# Patient Record
Sex: Male | Born: 1940 | Race: White | Hispanic: No | Smoking: Former smoker
Health system: Southern US, Community
[De-identification: ages and names within clinical notes are randomized; demographics above are authoritative.]

## PROBLEM LIST (undated history)

## (undated) DIAGNOSIS — I639 Cerebral infarction, unspecified: Secondary | ICD-10-CM

## (undated) DIAGNOSIS — I4891 Unspecified atrial fibrillation: Secondary | ICD-10-CM

## (undated) DIAGNOSIS — R6510 Systemic inflammatory response syndrome (SIRS) of non-infectious origin without acute organ dysfunction: Secondary | ICD-10-CM

## (undated) DIAGNOSIS — G600 Hereditary motor and sensory neuropathy: Secondary | ICD-10-CM

## (undated) DIAGNOSIS — C8510 Unspecified B-cell lymphoma, unspecified site: Secondary | ICD-10-CM

## (undated) DIAGNOSIS — R531 Weakness: Secondary | ICD-10-CM

## (undated) DIAGNOSIS — I10 Essential (primary) hypertension: Secondary | ICD-10-CM

## (undated) DIAGNOSIS — C801 Malignant (primary) neoplasm, unspecified: Secondary | ICD-10-CM

## (undated) HISTORY — PX: ANKLE FUSION: SHX5718

## (undated) HISTORY — PX: URETHRA SURGERY: SHX824

## (undated) HISTORY — DX: Cerebral infarction, unspecified: I63.9

## (undated) HISTORY — PX: CIRCUMCISION: SUR203

## (undated) HISTORY — DX: Unspecified B-cell lymphoma, unspecified site: C85.10

## (undated) HISTORY — DX: Unspecified atrial fibrillation: I48.91

## (undated) HISTORY — DX: Hereditary motor and sensory neuropathy: G60.0

## (undated) HISTORY — PX: CATARACT EXTRACTION: SUR2

## (undated) HISTORY — PX: TOE AMPUTATION: SHX809

---

## 1997-12-10 ENCOUNTER — Encounter: Admission: RE | Admit: 1997-12-10 | Discharge: 1998-03-10 | Payer: Self-pay | Admitting: Internal Medicine

## 1999-08-24 ENCOUNTER — Encounter: Payer: Self-pay | Admitting: Cardiology

## 1999-08-24 ENCOUNTER — Inpatient Hospital Stay (HOSPITAL_COMMUNITY): Admission: EM | Admit: 1999-08-24 | Discharge: 1999-08-25 | Payer: Self-pay | Admitting: Emergency Medicine

## 1999-08-25 ENCOUNTER — Encounter: Payer: Self-pay | Admitting: Cardiology

## 1999-11-28 ENCOUNTER — Ambulatory Visit (HOSPITAL_BASED_OUTPATIENT_CLINIC_OR_DEPARTMENT_OTHER): Admission: RE | Admit: 1999-11-28 | Discharge: 1999-11-28 | Payer: Self-pay | Admitting: Surgery

## 2000-05-17 ENCOUNTER — Encounter: Admission: RE | Admit: 2000-05-17 | Discharge: 2000-08-15 | Payer: Self-pay | Admitting: Endocrinology

## 2000-07-12 ENCOUNTER — Ambulatory Visit (HOSPITAL_COMMUNITY): Admission: RE | Admit: 2000-07-12 | Discharge: 2000-07-12 | Payer: Self-pay | Admitting: Cardiology

## 2001-06-17 ENCOUNTER — Ambulatory Visit (HOSPITAL_COMMUNITY): Admission: RE | Admit: 2001-06-17 | Discharge: 2001-06-17 | Payer: Self-pay | Admitting: Orthopedic Surgery

## 2001-10-05 ENCOUNTER — Encounter (INDEPENDENT_AMBULATORY_CARE_PROVIDER_SITE_OTHER): Payer: Self-pay | Admitting: Specialist

## 2001-10-05 ENCOUNTER — Encounter: Payer: Self-pay | Admitting: Orthopedic Surgery

## 2001-10-05 ENCOUNTER — Inpatient Hospital Stay (HOSPITAL_COMMUNITY): Admission: RE | Admit: 2001-10-05 | Discharge: 2001-10-11 | Payer: Self-pay | Admitting: Orthopedic Surgery

## 2002-05-06 ENCOUNTER — Emergency Department (HOSPITAL_COMMUNITY): Admission: EM | Admit: 2002-05-06 | Discharge: 2002-05-06 | Payer: Self-pay | Admitting: Emergency Medicine

## 2002-05-06 ENCOUNTER — Encounter: Payer: Self-pay | Admitting: Emergency Medicine

## 2008-12-02 ENCOUNTER — Inpatient Hospital Stay (HOSPITAL_COMMUNITY): Admission: EM | Admit: 2008-12-02 | Discharge: 2008-12-07 | Payer: Self-pay | Admitting: Emergency Medicine

## 2008-12-02 ENCOUNTER — Encounter: Payer: Self-pay | Admitting: Infectious Diseases

## 2008-12-02 ENCOUNTER — Ambulatory Visit: Payer: Self-pay | Admitting: Infectious Diseases

## 2008-12-03 ENCOUNTER — Encounter: Payer: Self-pay | Admitting: Infectious Diseases

## 2008-12-04 ENCOUNTER — Ambulatory Visit: Payer: Self-pay | Admitting: Physical Medicine & Rehabilitation

## 2008-12-04 ENCOUNTER — Encounter: Payer: Self-pay | Admitting: Infectious Diseases

## 2008-12-07 ENCOUNTER — Ambulatory Visit: Payer: Self-pay | Admitting: Physical Medicine & Rehabilitation

## 2008-12-07 ENCOUNTER — Inpatient Hospital Stay (HOSPITAL_COMMUNITY)
Admission: RE | Admit: 2008-12-07 | Discharge: 2008-12-19 | Payer: Self-pay | Admitting: Physical Medicine & Rehabilitation

## 2008-12-19 ENCOUNTER — Ambulatory Visit (HOSPITAL_COMMUNITY): Admission: AD | Admit: 2008-12-19 | Discharge: 2008-12-19 | Payer: Self-pay | Admitting: Interventional Radiology

## 2009-01-09 ENCOUNTER — Ambulatory Visit (HOSPITAL_COMMUNITY)
Admission: RE | Admit: 2009-01-09 | Discharge: 2009-01-09 | Payer: Self-pay | Admitting: Physical Medicine & Rehabilitation

## 2009-01-09 ENCOUNTER — Ambulatory Visit
Admission: RE | Admit: 2009-01-09 | Discharge: 2009-01-09 | Payer: Self-pay | Admitting: Physical Medicine & Rehabilitation

## 2009-01-09 ENCOUNTER — Encounter: Payer: Self-pay | Admitting: Interventional Radiology

## 2009-01-29 ENCOUNTER — Encounter
Admission: RE | Admit: 2009-01-29 | Discharge: 2009-02-05 | Payer: Self-pay | Admitting: Physical Medicine & Rehabilitation

## 2009-02-05 ENCOUNTER — Ambulatory Visit: Payer: Self-pay | Admitting: Physical Medicine & Rehabilitation

## 2009-04-03 ENCOUNTER — Ambulatory Visit: Payer: Self-pay | Admitting: Vascular Surgery

## 2009-04-03 ENCOUNTER — Encounter (INDEPENDENT_AMBULATORY_CARE_PROVIDER_SITE_OTHER): Payer: Self-pay | Admitting: Interventional Radiology

## 2009-04-03 ENCOUNTER — Ambulatory Visit (HOSPITAL_COMMUNITY): Admission: RE | Admit: 2009-04-03 | Discharge: 2009-04-03 | Payer: Self-pay | Admitting: Interventional Radiology

## 2009-10-24 ENCOUNTER — Encounter: Admission: RE | Admit: 2009-10-24 | Discharge: 2009-10-24 | Payer: Self-pay | Admitting: Family Medicine

## 2009-10-30 ENCOUNTER — Encounter: Admission: RE | Admit: 2009-10-30 | Discharge: 2009-10-30 | Payer: Self-pay | Admitting: Family Medicine

## 2009-11-06 ENCOUNTER — Other Ambulatory Visit: Admission: RE | Admit: 2009-11-06 | Discharge: 2009-11-06 | Payer: Self-pay | Admitting: Otolaryngology

## 2009-11-22 ENCOUNTER — Ambulatory Visit (HOSPITAL_COMMUNITY): Admission: RE | Admit: 2009-11-22 | Discharge: 2009-11-23 | Payer: Self-pay | Admitting: Otolaryngology

## 2009-11-22 ENCOUNTER — Encounter (INDEPENDENT_AMBULATORY_CARE_PROVIDER_SITE_OTHER): Payer: Self-pay | Admitting: Otolaryngology

## 2009-11-27 ENCOUNTER — Ambulatory Visit: Payer: Self-pay | Admitting: Oncology

## 2009-12-04 LAB — CBC WITH DIFFERENTIAL/PLATELET
BASO%: 0.1 % (ref 0.0–2.0)
Basophils Absolute: 0 10*3/uL (ref 0.0–0.1)
HCT: 48 % (ref 38.4–49.9)
HGB: 16.3 g/dL (ref 13.0–17.1)
MCHC: 34.1 g/dL (ref 32.0–36.0)
MONO#: 1 10*3/uL — ABNORMAL HIGH (ref 0.1–0.9)
NEUT%: 75.3 % — ABNORMAL HIGH (ref 39.0–75.0)
RDW: 12.9 % (ref 11.0–14.6)
WBC: 13.3 10*3/uL — ABNORMAL HIGH (ref 4.0–10.3)
lymph#: 1.8 10*3/uL (ref 0.9–3.3)

## 2009-12-05 LAB — LACTATE DEHYDROGENASE: LDH: 140 U/L (ref 94–250)

## 2009-12-05 LAB — COMPREHENSIVE METABOLIC PANEL
ALT: 15 U/L (ref 0–53)
Albumin: 3.5 g/dL (ref 3.5–5.2)
CO2: 25 mEq/L (ref 19–32)
Calcium: 8.6 mg/dL (ref 8.4–10.5)
Chloride: 103 mEq/L (ref 96–112)
Creatinine, Ser: 0.73 mg/dL (ref 0.40–1.50)
Potassium: 4.3 mEq/L (ref 3.5–5.3)

## 2009-12-05 LAB — HEPATITIS B SURFACE ANTIGEN: Hepatitis B Surface Ag: NEGATIVE

## 2009-12-10 ENCOUNTER — Ambulatory Visit (HOSPITAL_COMMUNITY): Admission: RE | Admit: 2009-12-10 | Discharge: 2009-12-10 | Payer: Self-pay | Admitting: Oncology

## 2009-12-12 ENCOUNTER — Ambulatory Visit (HOSPITAL_COMMUNITY): Admission: RE | Admit: 2009-12-12 | Discharge: 2009-12-12 | Payer: Self-pay | Admitting: Oncology

## 2009-12-13 ENCOUNTER — Ambulatory Visit (HOSPITAL_COMMUNITY): Admission: RE | Admit: 2009-12-13 | Discharge: 2009-12-13 | Payer: Self-pay | Admitting: Oncology

## 2009-12-19 ENCOUNTER — Encounter: Payer: Self-pay | Admitting: Oncology

## 2009-12-19 ENCOUNTER — Ambulatory Visit: Admission: RE | Admit: 2009-12-19 | Discharge: 2009-12-19 | Payer: Self-pay | Admitting: Oncology

## 2009-12-27 ENCOUNTER — Ambulatory Visit: Payer: Self-pay | Admitting: Oncology

## 2009-12-27 LAB — COMPREHENSIVE METABOLIC PANEL
ALT: 16 U/L (ref 0–53)
AST: 14 U/L (ref 0–37)
Alkaline Phosphatase: 74 U/L (ref 39–117)
Glucose, Bld: 214 mg/dL — ABNORMAL HIGH (ref 70–99)
Sodium: 137 mEq/L (ref 135–145)
Total Bilirubin: 0.4 mg/dL (ref 0.3–1.2)
Total Protein: 6.5 g/dL (ref 6.0–8.3)

## 2009-12-27 LAB — CBC WITH DIFFERENTIAL/PLATELET
Basophils Absolute: 0 10*3/uL (ref 0.0–0.1)
EOS%: 4.6 % (ref 0.0–7.0)
HCT: 46.6 % (ref 38.4–49.9)
HGB: 15.6 g/dL (ref 13.0–17.1)
MCH: 29.8 pg (ref 27.2–33.4)
NEUT%: 68.6 % (ref 39.0–75.0)
lymph#: 2 10*3/uL (ref 0.9–3.3)

## 2009-12-27 LAB — URIC ACID: Uric Acid, Serum: 4.7 mg/dL (ref 4.0–7.8)

## 2010-01-17 LAB — CBC WITH DIFFERENTIAL/PLATELET
Basophils Absolute: 0 10*3/uL (ref 0.0–0.1)
EOS%: 0.8 % (ref 0.0–7.0)
HCT: 43.4 % (ref 38.4–49.9)
HGB: 14.8 g/dL (ref 13.0–17.1)
LYMPH%: 11.5 % — ABNORMAL LOW (ref 14.0–49.0)
MCH: 30.1 pg (ref 27.2–33.4)
MCV: 88.2 fL (ref 79.3–98.0)
MONO%: 7.1 % (ref 0.0–14.0)
NEUT%: 80.3 % — ABNORMAL HIGH (ref 39.0–75.0)
RDW: 13 % (ref 11.0–14.6)

## 2010-01-17 LAB — COMPREHENSIVE METABOLIC PANEL
AST: 16 U/L (ref 0–37)
Alkaline Phosphatase: 77 U/L (ref 39–117)
BUN: 15 mg/dL (ref 6–23)
Creatinine, Ser: 0.67 mg/dL (ref 0.40–1.50)
Total Bilirubin: 0.3 mg/dL (ref 0.3–1.2)

## 2010-01-17 LAB — MAGNESIUM: Magnesium: 1.8 mg/dL (ref 1.5–2.5)

## 2010-02-05 ENCOUNTER — Ambulatory Visit: Payer: Self-pay | Admitting: Oncology

## 2010-02-07 LAB — LACTATE DEHYDROGENASE: LDH: 172 U/L (ref 94–250)

## 2010-02-07 LAB — CBC WITH DIFFERENTIAL/PLATELET
Basophils Absolute: 0.1 10*3/uL (ref 0.0–0.1)
HCT: 41.1 % (ref 38.4–49.9)
HGB: 14.4 g/dL (ref 13.0–17.1)
LYMPH%: 12.8 % — ABNORMAL LOW (ref 14.0–49.0)
MCH: 30 pg (ref 27.2–33.4)
MONO#: 1.2 10*3/uL — ABNORMAL HIGH (ref 0.1–0.9)
NEUT%: 75.5 % — ABNORMAL HIGH (ref 39.0–75.0)
Platelets: 159 10*3/uL (ref 140–400)
WBC: 12.1 10*3/uL — ABNORMAL HIGH (ref 4.0–10.3)
lymph#: 1.6 10*3/uL (ref 0.9–3.3)

## 2010-02-07 LAB — MAGNESIUM: Magnesium: 1.8 mg/dL (ref 1.5–2.5)

## 2010-02-07 LAB — COMPREHENSIVE METABOLIC PANEL
BUN: 20 mg/dL (ref 6–23)
CO2: 27 mEq/L (ref 19–32)
Calcium: 8.9 mg/dL (ref 8.4–10.5)
Chloride: 100 mEq/L (ref 96–112)
Creatinine, Ser: 0.75 mg/dL (ref 0.40–1.50)
Glucose, Bld: 296 mg/dL — ABNORMAL HIGH (ref 70–99)

## 2010-02-07 LAB — URIC ACID: Uric Acid, Serum: 5.1 mg/dL (ref 4.0–7.8)

## 2010-02-26 ENCOUNTER — Ambulatory Visit (HOSPITAL_COMMUNITY): Admission: RE | Admit: 2010-02-26 | Discharge: 2010-02-26 | Payer: Self-pay | Admitting: Oncology

## 2010-02-27 LAB — CBC WITH DIFFERENTIAL/PLATELET
BASO%: 0.6 % (ref 0.0–2.0)
HCT: 39.2 % (ref 38.4–49.9)
LYMPH%: 5.6 % — ABNORMAL LOW (ref 14.0–49.0)
MCH: 30.3 pg (ref 27.2–33.4)
MCHC: 35.2 g/dL (ref 32.0–36.0)
MCV: 86.2 fL (ref 79.3–98.0)
MONO#: 1.2 10*3/uL — ABNORMAL HIGH (ref 0.1–0.9)
MONO%: 8.4 % (ref 0.0–14.0)
NEUT%: 84.9 % — ABNORMAL HIGH (ref 39.0–75.0)
Platelets: 180 10*3/uL (ref 140–400)
RBC: 4.55 10*6/uL (ref 4.20–5.82)
WBC: 14.2 10*3/uL — ABNORMAL HIGH (ref 4.0–10.3)

## 2010-02-27 LAB — COMPREHENSIVE METABOLIC PANEL
AST: 13 U/L (ref 0–37)
BUN: 12 mg/dL (ref 6–23)
Calcium: 9 mg/dL (ref 8.4–10.5)
Chloride: 102 mEq/L (ref 96–112)
Creatinine, Ser: 0.7 mg/dL (ref 0.40–1.50)
Total Bilirubin: 0.5 mg/dL (ref 0.3–1.2)

## 2010-03-04 ENCOUNTER — Ambulatory Visit: Admission: RE | Admit: 2010-03-04 | Discharge: 2010-04-15 | Payer: Self-pay | Admitting: Radiation Oncology

## 2010-03-25 ENCOUNTER — Ambulatory Visit: Payer: Self-pay | Admitting: Oncology

## 2010-03-27 LAB — COMPREHENSIVE METABOLIC PANEL
ALT: 13 U/L (ref 0–53)
AST: 13 U/L (ref 0–37)
Albumin: 3.5 g/dL (ref 3.5–5.2)
Alkaline Phosphatase: 52 U/L (ref 39–117)
Potassium: 4.1 mEq/L (ref 3.5–5.3)
Sodium: 141 mEq/L (ref 135–145)
Total Bilirubin: 0.5 mg/dL (ref 0.3–1.2)
Total Protein: 5.6 g/dL — ABNORMAL LOW (ref 6.0–8.3)

## 2010-03-27 LAB — CBC WITH DIFFERENTIAL/PLATELET
BASO%: 0 % (ref 0.0–2.0)
EOS%: 4 % (ref 0.0–7.0)
Eosinophils Absolute: 0.5 10*3/uL (ref 0.0–0.5)
LYMPH%: 8 % — ABNORMAL LOW (ref 14.0–49.0)
MCHC: 34.9 g/dL (ref 32.0–36.0)
MCV: 91.7 fL (ref 79.3–98.0)
MONO%: 8.6 % (ref 0.0–14.0)
NEUT#: 9.1 10*3/uL — ABNORMAL HIGH (ref 1.5–6.5)
Platelets: 127 10*3/uL — ABNORMAL LOW (ref 140–400)
RBC: 4.28 10*6/uL (ref 4.20–5.82)
RDW: 15.3 % — ABNORMAL HIGH (ref 11.0–14.6)
WBC: 11.4 10*3/uL — ABNORMAL HIGH (ref 4.0–10.3)

## 2010-05-13 ENCOUNTER — Ambulatory Visit: Payer: Self-pay | Admitting: Oncology

## 2010-06-04 ENCOUNTER — Encounter
Admission: RE | Admit: 2010-06-04 | Discharge: 2010-06-04 | Payer: Self-pay | Source: Home / Self Care | Attending: Family Medicine | Admitting: Family Medicine

## 2010-06-20 ENCOUNTER — Ambulatory Visit: Payer: Self-pay | Admitting: Oncology

## 2010-06-26 LAB — CBC WITH DIFFERENTIAL/PLATELET
BASO%: 0.3 % (ref 0.0–2.0)
Basophils Absolute: 0 10*3/uL (ref 0.0–0.1)
EOS%: 1.9 % (ref 0.0–7.0)
Eosinophils Absolute: 0.3 10*3/uL (ref 0.0–0.5)
HCT: 44.5 % (ref 38.4–49.9)
HGB: 15.8 g/dL (ref 13.0–17.1)
LYMPH%: 6.2 % — ABNORMAL LOW (ref 14.0–49.0)
MCH: 30.3 pg (ref 27.2–33.4)
MCHC: 35.5 g/dL (ref 32.0–36.0)
MCV: 85.2 fL (ref 79.3–98.0)
MONO#: 1.6 10*3/uL — ABNORMAL HIGH (ref 0.1–0.9)
MONO%: 10.5 % (ref 0.0–14.0)
NEUT#: 12.7 10*3/uL — ABNORMAL HIGH (ref 1.5–6.5)
NEUT%: 81.1 % — ABNORMAL HIGH (ref 39.0–75.0)
Platelets: 138 10*3/uL — ABNORMAL LOW (ref 140–400)
RBC: 5.22 10*6/uL (ref 4.20–5.82)
RDW: 12.7 % (ref 11.0–14.6)
WBC: 15.6 10*3/uL — ABNORMAL HIGH (ref 4.0–10.3)
lymph#: 1 10*3/uL (ref 0.9–3.3)

## 2010-06-26 LAB — LACTATE DEHYDROGENASE: LDH: 141 U/L (ref 94–250)

## 2010-06-26 LAB — COMPREHENSIVE METABOLIC PANEL
ALT: 22 U/L (ref 0–53)
AST: 15 U/L (ref 0–37)
Albumin: 3.8 g/dL (ref 3.5–5.2)
Alkaline Phosphatase: 63 U/L (ref 39–117)
BUN: 13 mg/dL (ref 6–23)
CO2: 30 mEq/L (ref 19–32)
Calcium: 9.2 mg/dL (ref 8.4–10.5)
Chloride: 101 mEq/L (ref 96–112)
Creatinine, Ser: 0.66 mg/dL (ref 0.40–1.50)
Glucose, Bld: 146 mg/dL — ABNORMAL HIGH (ref 70–99)
Potassium: 4.2 mEq/L (ref 3.5–5.3)
Sodium: 141 mEq/L (ref 135–145)
Total Bilirubin: 0.4 mg/dL (ref 0.3–1.2)
Total Protein: 6.3 g/dL (ref 6.0–8.3)

## 2010-07-11 ENCOUNTER — Other Ambulatory Visit: Payer: Self-pay | Admitting: Oncology

## 2010-07-11 DIAGNOSIS — C8590 Non-Hodgkin lymphoma, unspecified, unspecified site: Secondary | ICD-10-CM

## 2010-07-14 ENCOUNTER — Encounter: Payer: Self-pay | Admitting: Physical Medicine & Rehabilitation

## 2010-08-08 ENCOUNTER — Encounter (HOSPITAL_BASED_OUTPATIENT_CLINIC_OR_DEPARTMENT_OTHER): Payer: MEDICARE | Admitting: Oncology

## 2010-08-08 DIAGNOSIS — Z452 Encounter for adjustment and management of vascular access device: Secondary | ICD-10-CM

## 2010-08-08 DIAGNOSIS — C8291 Follicular lymphoma, unspecified, lymph nodes of head, face, and neck: Secondary | ICD-10-CM

## 2010-08-25 ENCOUNTER — Other Ambulatory Visit: Payer: Self-pay | Admitting: Oncology

## 2010-08-25 ENCOUNTER — Encounter (HOSPITAL_COMMUNITY): Payer: Self-pay

## 2010-08-25 ENCOUNTER — Encounter (HOSPITAL_BASED_OUTPATIENT_CLINIC_OR_DEPARTMENT_OTHER): Payer: MEDICARE | Admitting: Oncology

## 2010-08-25 ENCOUNTER — Ambulatory Visit (HOSPITAL_COMMUNITY)
Admission: RE | Admit: 2010-08-25 | Discharge: 2010-08-25 | Disposition: A | Discharge status: Home / Self Care | Payer: MEDICARE | Source: Ambulatory Visit | Attending: Oncology | Admitting: Oncology

## 2010-08-25 DIAGNOSIS — Z9221 Personal history of antineoplastic chemotherapy: Secondary | ICD-10-CM | POA: Insufficient documentation

## 2010-08-25 DIAGNOSIS — D739 Disease of spleen, unspecified: Secondary | ICD-10-CM | POA: Insufficient documentation

## 2010-08-25 DIAGNOSIS — Z452 Encounter for adjustment and management of vascular access device: Secondary | ICD-10-CM

## 2010-08-25 DIAGNOSIS — I2510 Atherosclerotic heart disease of native coronary artery without angina pectoris: Secondary | ICD-10-CM | POA: Insufficient documentation

## 2010-08-25 DIAGNOSIS — K7689 Other specified diseases of liver: Secondary | ICD-10-CM | POA: Insufficient documentation

## 2010-08-25 DIAGNOSIS — N400 Enlarged prostate without lower urinary tract symptoms: Secondary | ICD-10-CM | POA: Insufficient documentation

## 2010-08-25 DIAGNOSIS — C8589 Other specified types of non-Hodgkin lymphoma, extranodal and solid organ sites: Secondary | ICD-10-CM | POA: Insufficient documentation

## 2010-08-25 DIAGNOSIS — K4090 Unilateral inguinal hernia, without obstruction or gangrene, not specified as recurrent: Secondary | ICD-10-CM | POA: Insufficient documentation

## 2010-08-25 DIAGNOSIS — C8291 Follicular lymphoma, unspecified, lymph nodes of head, face, and neck: Secondary | ICD-10-CM

## 2010-08-25 DIAGNOSIS — C8590 Non-Hodgkin lymphoma, unspecified, unspecified site: Secondary | ICD-10-CM

## 2010-08-25 DIAGNOSIS — Z923 Personal history of irradiation: Secondary | ICD-10-CM | POA: Insufficient documentation

## 2010-08-25 DIAGNOSIS — Q619 Cystic kidney disease, unspecified: Secondary | ICD-10-CM | POA: Insufficient documentation

## 2010-08-25 HISTORY — DX: Malignant (primary) neoplasm, unspecified: C80.1

## 2010-08-25 HISTORY — DX: Essential (primary) hypertension: I10

## 2010-08-25 LAB — CBC WITH DIFFERENTIAL/PLATELET
Basophils Absolute: 0 10*3/uL (ref 0.0–0.1)
Eosinophils Absolute: 0.3 10*3/uL (ref 0.0–0.5)
HCT: 46.6 % (ref 38.4–49.9)
HGB: 15.7 g/dL (ref 13.0–17.1)
LYMPH%: 12.4 % — ABNORMAL LOW (ref 14.0–49.0)
MCV: 89.3 fL (ref 79.3–98.0)
MONO#: 1 10*3/uL — ABNORMAL HIGH (ref 0.1–0.9)
MONO%: 9.7 % (ref 0.0–14.0)
NEUT#: 7.5 10*3/uL — ABNORMAL HIGH (ref 1.5–6.5)
NEUT%: 74.5 % (ref 39.0–75.0)
Platelets: 127 10*3/uL — ABNORMAL LOW (ref 140–400)
RBC: 5.22 10*6/uL (ref 4.20–5.82)
WBC: 10.1 10*3/uL (ref 4.0–10.3)

## 2010-08-25 LAB — LACTATE DEHYDROGENASE: LDH: 155 U/L (ref 94–250)

## 2010-08-25 LAB — CMP (CANCER CENTER ONLY)
Albumin: 3.4 g/dL (ref 3.3–5.5)
CO2: 32 mEq/L (ref 18–33)
Glucose, Bld: 109 mg/dL (ref 73–118)
Sodium: 135 mEq/L (ref 128–145)
Total Bilirubin: 0.5 mg/dl (ref 0.20–1.60)
Total Protein: 6.7 g/dL (ref 6.4–8.1)

## 2010-08-25 MED ORDER — IOHEXOL 300 MG/ML  SOLN
100.0000 mL | Freq: Once | INTRAMUSCULAR | Status: AC | PRN
  Administered 2010-08-25: 100 mL via INTRAVENOUS

## 2010-09-01 ENCOUNTER — Encounter (HOSPITAL_BASED_OUTPATIENT_CLINIC_OR_DEPARTMENT_OTHER): Payer: MEDICARE | Admitting: Oncology

## 2010-09-01 DIAGNOSIS — D72829 Elevated white blood cell count, unspecified: Secondary | ICD-10-CM

## 2010-09-01 DIAGNOSIS — C8291 Follicular lymphoma, unspecified, lymph nodes of head, face, and neck: Secondary | ICD-10-CM

## 2010-09-07 LAB — GLUCOSE, CAPILLARY: Glucose-Capillary: 151 mg/dL — ABNORMAL HIGH (ref 70–99)

## 2010-09-08 LAB — CBC
HCT: 49.4 % (ref 39.0–52.0)
MCH: 30.8 pg (ref 26.0–34.0)
MCHC: 34.2 g/dL (ref 30.0–36.0)
MCV: 90 fL (ref 78.0–100.0)
Platelets: 150 10*3/uL (ref 150–400)
Platelets: 153 10*3/uL (ref 150–400)
RBC: 5.04 MIL/uL (ref 4.22–5.81)
RDW: 12.9 % (ref 11.5–15.5)

## 2010-09-08 LAB — GLUCOSE, CAPILLARY
Glucose-Capillary: 313 mg/dL — ABNORMAL HIGH (ref 70–99)
Glucose-Capillary: 313 mg/dL — ABNORMAL HIGH (ref 70–99)

## 2010-09-08 LAB — PROTIME-INR: Prothrombin Time: 14.1 seconds (ref 11.6–15.2)

## 2010-09-08 LAB — BASIC METABOLIC PANEL
BUN: 9 mg/dL (ref 6–23)
CO2: 28 mEq/L (ref 19–32)
Chloride: 99 mEq/L (ref 96–112)
Glucose, Bld: 227 mg/dL — ABNORMAL HIGH (ref 70–99)
Potassium: 4.2 mEq/L (ref 3.5–5.1)

## 2010-09-29 LAB — TROPONIN I: Troponin I: 0.01 ng/mL (ref 0.00–0.06)

## 2010-09-29 LAB — BASIC METABOLIC PANEL
BUN: 10 mg/dL (ref 6–23)
BUN: 12 mg/dL (ref 6–23)
BUN: 7 mg/dL (ref 6–23)
CO2: 26 mEq/L (ref 19–32)
Calcium: 8.8 mg/dL (ref 8.4–10.5)
Calcium: 9 mg/dL (ref 8.4–10.5)
Chloride: 108 mEq/L (ref 96–112)
Chloride: 110 mEq/L (ref 96–112)
Creatinine, Ser: 0.5 mg/dL (ref 0.4–1.5)
Creatinine, Ser: 0.57 mg/dL (ref 0.4–1.5)
GFR calc Af Amer: >60 mL/min (ref >60)
GFR calc Af Amer: >60 mL/min (ref >60)
GFR calc Af Amer: >60 mL/min (ref >60)
GFR calc non Af Amer: >60 mL/min (ref >60)
GFR calc non Af Amer: >60 mL/min (ref >60)
GFR calc non Af Amer: >60 mL/min (ref >60)
GFR calc non Af Amer: >60 mL/min (ref >60)
Glucose, Bld: 163 mg/dL — ABNORMAL HIGH (ref 70–99)
Glucose, Bld: 98 mg/dL (ref 70–99)
Potassium: 3.8 mEq/L (ref 3.5–5.1)
Potassium: 3.6 mEq/L (ref 3.5–5.1)
Potassium: 4.1 mEq/L (ref 3.5–5.1)
Sodium: 141 mEq/L (ref 135–145)
Sodium: 141 mEq/L (ref 135–145)
Sodium: 142 mEq/L (ref 135–145)

## 2010-09-29 LAB — CBC
HCT: 41.6 % (ref 39.0–52.0)
HCT: 41.9 % (ref 39.0–52.0)
HCT: 43.8 % (ref 39.0–52.0)
HCT: 41.6 % (ref 39.0–52.0)
HCT: 43.9 % (ref 39.0–52.0)
HCT: 48.1 % (ref 39.0–52.0)
Hemoglobin: 14 g/dL (ref 13.0–17.0)
Hemoglobin: 14 g/dL (ref 13.0–17.0)
Hemoglobin: 16.7 g/dL (ref 13.0–17.0)
MCHC: 33.6 g/dL (ref 30.0–36.0)
MCHC: 34.1 g/dL (ref 30.0–36.0)
MCHC: 34.4 g/dL (ref 30.0–36.0)
MCHC: 34 g/dL (ref 30.0–36.0)
MCV: 90.5 fL (ref 78.0–100.0)
MCV: 91.1 fL (ref 78.0–100.0)
MCV: 89.2 fL (ref 78.0–100.0)
MCV: 90 fL (ref 78.0–100.0)
MCV: 90.7 fL (ref 78.0–100.0)
Platelets: 139 10*3/uL — ABNORMAL LOW (ref 150–400)
Platelets: 148 10*3/uL — ABNORMAL LOW (ref 150–400)
Platelets: 182 10*3/uL (ref 150–400)
Platelets: 145 10*3/uL — ABNORMAL LOW (ref 150–400)
Platelets: 159 10*3/uL (ref 150–400)
RBC: 4.39 MIL/uL (ref 4.22–5.81)
RBC: 4.68 MIL/uL (ref 4.22–5.81)
RBC: 4.62 MIL/uL (ref 4.22–5.81)
RDW: 12.7 % (ref 11.5–15.5)
RDW: 12.8 % (ref 11.5–15.5)
RDW: 12.9 % (ref 11.5–15.5)
RDW: 13.2 % (ref 11.5–15.5)
WBC: 10.1 10*3/uL (ref 4.0–10.5)
WBC: 12.1 10*3/uL — ABNORMAL HIGH (ref 4.0–10.5)
WBC: 12.3 10*3/uL — ABNORMAL HIGH (ref 4.0–10.5)
WBC: 13.1 10*3/uL — ABNORMAL HIGH (ref 4.0–10.5)
WBC: 13.8 10*3/uL — ABNORMAL HIGH (ref 4.0–10.5)

## 2010-09-29 LAB — GLUCOSE, CAPILLARY
Glucose-Capillary: 108 mg/dL — ABNORMAL HIGH (ref 70–99)
Glucose-Capillary: 121 mg/dL — ABNORMAL HIGH (ref 70–99)
Glucose-Capillary: 138 mg/dL — ABNORMAL HIGH (ref 70–99)
Glucose-Capillary: 139 mg/dL — ABNORMAL HIGH (ref 70–99)
Glucose-Capillary: 144 mg/dL — ABNORMAL HIGH (ref 70–99)
Glucose-Capillary: 163 mg/dL — ABNORMAL HIGH (ref 70–99)
Glucose-Capillary: 175 mg/dL — ABNORMAL HIGH (ref 70–99)
Glucose-Capillary: 181 mg/dL — ABNORMAL HIGH (ref 70–99)
Glucose-Capillary: 191 mg/dL — ABNORMAL HIGH (ref 70–99)
Glucose-Capillary: 211 mg/dL — ABNORMAL HIGH (ref 70–99)
Glucose-Capillary: 214 mg/dL — ABNORMAL HIGH (ref 70–99)
Glucose-Capillary: 214 mg/dL — ABNORMAL HIGH (ref 70–99)
Glucose-Capillary: 218 mg/dL — ABNORMAL HIGH (ref 70–99)
Glucose-Capillary: 235 mg/dL — ABNORMAL HIGH (ref 70–99)
Glucose-Capillary: 237 mg/dL — ABNORMAL HIGH (ref 70–99)
Glucose-Capillary: 239 mg/dL — ABNORMAL HIGH (ref 70–99)
Glucose-Capillary: 245 mg/dL — ABNORMAL HIGH (ref 70–99)
Glucose-Capillary: 246 mg/dL — ABNORMAL HIGH (ref 70–99)
Glucose-Capillary: 250 mg/dL — ABNORMAL HIGH (ref 70–99)
Glucose-Capillary: 259 mg/dL — ABNORMAL HIGH (ref 70–99)
Glucose-Capillary: 261 mg/dL — ABNORMAL HIGH (ref 70–99)
Glucose-Capillary: 264 mg/dL — ABNORMAL HIGH (ref 70–99)
Glucose-Capillary: 268 mg/dL — ABNORMAL HIGH (ref 70–99)
Glucose-Capillary: 269 mg/dL — ABNORMAL HIGH (ref 70–99)
Glucose-Capillary: 272 mg/dL — ABNORMAL HIGH (ref 70–99)
Glucose-Capillary: 272 mg/dL — ABNORMAL HIGH (ref 70–99)
Glucose-Capillary: 279 mg/dL — ABNORMAL HIGH (ref 70–99)
Glucose-Capillary: 295 mg/dL — ABNORMAL HIGH (ref 70–99)
Glucose-Capillary: 329 mg/dL — ABNORMAL HIGH (ref 70–99)
Glucose-Capillary: 332 mg/dL — ABNORMAL HIGH (ref 70–99)
Glucose-Capillary: 351 mg/dL — ABNORMAL HIGH (ref 70–99)
Glucose-Capillary: 358 mg/dL — ABNORMAL HIGH (ref 70–99)
Glucose-Capillary: 398 mg/dL — ABNORMAL HIGH (ref 70–99)
Glucose-Capillary: 59 mg/dL — ABNORMAL LOW (ref 70–99)
Glucose-Capillary: 62 mg/dL — ABNORMAL LOW (ref 70–99)
Glucose-Capillary: 66 mg/dL — ABNORMAL LOW (ref 70–99)
Glucose-Capillary: 71 mg/dL (ref 70–99)
Glucose-Capillary: 76 mg/dL (ref 70–99)
Glucose-Capillary: 106 mg/dL — ABNORMAL HIGH (ref 70–99)
Glucose-Capillary: 113 mg/dL — ABNORMAL HIGH (ref 70–99)
Glucose-Capillary: 210 mg/dL — ABNORMAL HIGH (ref 70–99)
Glucose-Capillary: 66 mg/dL — ABNORMAL LOW (ref 70–99)
Glucose-Capillary: 71 mg/dL (ref 70–99)
Glucose-Capillary: 74 mg/dL (ref 70–99)
Glucose-Capillary: 75 mg/dL (ref 70–99)
Glucose-Capillary: 80 mg/dL (ref 70–99)
Glucose-Capillary: 80 mg/dL (ref 70–99)
Glucose-Capillary: 91 mg/dL (ref 70–99)

## 2010-09-29 LAB — URINE CULTURE

## 2010-09-29 LAB — CK TOTAL AND CKMB (NOT AT ARMC)
CK, MB: 0.5 ng/mL (ref 0.3–4.0)
Relative Index: INVALID (ref 0.0–2.5)
Total CK: 7 U/L (ref 7–232)

## 2010-09-29 LAB — DIFFERENTIAL
Basophils Absolute: 0 10*3/uL (ref 0.0–0.1)
Basophils Absolute: 0.1 10*3/uL (ref 0.0–0.1)
Eosinophils Absolute: 0.2 10*3/uL (ref 0.0–0.7)
Eosinophils Absolute: 0.2 10*3/uL (ref 0.0–0.7)
Eosinophils Relative: 4 % (ref 0–5)
Eosinophils Relative: 1 % (ref 0–5)
Eosinophils Relative: 2 % (ref 0–5)
Lymphocytes Relative: 17 % (ref 12–46)
Lymphocytes Relative: 18 % (ref 12–46)
Lymphocytes Relative: 12 % (ref 12–46)
Lymphocytes Relative: 24 % (ref 12–46)
Lymphs Abs: 2.2 10*3/uL (ref 0.7–4.0)
Lymphs Abs: 1.7 10*3/uL (ref 0.7–4.0)
Lymphs Abs: 3 10*3/uL (ref 0.7–4.0)
Monocytes Absolute: 0.7 10*3/uL (ref 0.1–1.0)
Monocytes Absolute: 1.2 10*3/uL — ABNORMAL HIGH (ref 0.1–1.0)
Monocytes Absolute: 0.9 10*3/uL (ref 0.1–1.0)
Monocytes Absolute: 1.5 10*3/uL — ABNORMAL HIGH (ref 0.1–1.0)
Monocytes Relative: 10 % (ref 3–12)
Monocytes Relative: 8 % (ref 3–12)
Neutro Abs: 6.1 10*3/uL (ref 1.7–7.7)
Neutro Abs: 8.1 10*3/uL — ABNORMAL HIGH (ref 1.7–7.7)

## 2010-09-29 LAB — COMPREHENSIVE METABOLIC PANEL
Albumin: 2.4 g/dL — ABNORMAL LOW (ref 3.5–5.2)
Albumin: 2.6 g/dL — ABNORMAL LOW (ref 3.5–5.2)
BUN: 10 mg/dL (ref 6–23)
BUN: 9 mg/dL (ref 6–23)
CO2: 28 mEq/L (ref 19–32)
Calcium: 8.4 mg/dL (ref 8.4–10.5)
Chloride: 108 mEq/L (ref 96–112)
Creatinine, Ser: 0.56 mg/dL (ref 0.4–1.5)
Creatinine, Ser: 0.54 mg/dL (ref 0.4–1.5)
GFR calc non Af Amer: >60 mL/min (ref >60)
Total Bilirubin: 0.8 mg/dL (ref 0.3–1.2)
Total Protein: 5.6 g/dL — ABNORMAL LOW (ref 6.0–8.3)

## 2010-09-29 LAB — APTT
aPTT: 37 seconds (ref 24–37)
aPTT: 32 seconds (ref 24–37)

## 2010-09-29 LAB — HEMOGLOBIN A1C
Hgb A1c MFr Bld: 9 % — ABNORMAL HIGH (ref 4.6–6.1)
Mean Plasma Glucose: 212 mg/dL

## 2010-09-29 LAB — LIPID PANEL
HDL: 19 mg/dL — ABNORMAL LOW (ref >39)
Total CHOL/HDL Ratio: 6.9 RATIO
Triglycerides: 64 mg/dL (ref <150)

## 2010-09-29 LAB — URINALYSIS, ROUTINE W REFLEX MICROSCOPIC
Ketones, ur: NEGATIVE mg/dL
Nitrite: NEGATIVE
Protein, ur: NEGATIVE mg/dL
Specific Gravity, Urine: 1.032 — ABNORMAL HIGH (ref 1.005–1.030)
Urobilinogen, UA: 0.2 mg/dL (ref 0.0–1.0)

## 2010-09-29 LAB — PROTIME-INR
INR: 1.1 (ref 0.00–1.49)
Prothrombin Time: 14.1 s (ref 11.6–15.2)

## 2010-09-29 LAB — URINALYSIS, MICROSCOPIC ONLY
Bilirubin Urine: NEGATIVE
Hgb urine dipstick: NEGATIVE
Ketones, ur: NEGATIVE mg/dL
Nitrite: POSITIVE — AB
pH: 7 (ref 5.0–8.0)

## 2010-09-29 LAB — URINE MICROSCOPIC-ADD ON

## 2010-09-29 LAB — HEPARIN LEVEL (UNFRACTIONATED): Heparin Unfractionated: 0.42 IU/mL (ref 0.30–0.70)

## 2010-10-03 ENCOUNTER — Encounter (HOSPITAL_BASED_OUTPATIENT_CLINIC_OR_DEPARTMENT_OTHER): Payer: MEDICARE | Admitting: Oncology

## 2010-10-03 DIAGNOSIS — C8291 Follicular lymphoma, unspecified, lymph nodes of head, face, and neck: Secondary | ICD-10-CM

## 2010-10-03 DIAGNOSIS — Z452 Encounter for adjustment and management of vascular access device: Secondary | ICD-10-CM

## 2010-11-04 NOTE — H&P (Signed)
NAMELYNX, GOODRICH               ACCOUNT NO.:  [id number]   MEDICAL RECORD NO.:  [id number]          PATIENT TYPE:  AMB   LOCATION:                               FACILITY:  [facility]   PHYSICIAN:  Sanjeev K. Deveshwar, M.D.DATE OF BIRTH:  23-Dec-1940   DATE OF ADMISSION:  12/19/2008  DATE OF DISCHARGE:                              HISTORY & PHYSICAL   CHIEF COMPLAINT:  Cerebrovascular disease.   HISTORY OF PRESENT ILLNESS:  Mr. Riker is a pleasant 70 year old  male, who was admitted to [facility] on December 02, 2008, with a  left brain stem stroke.  On presentation, the patient had decreased  hearing as well as right-sided weakness that began approximately 1 month  prior to admission.  He also had a severe left-sided headache and some  difficulties with his balance.  A CT scan of the head on December 02, 2008,  showed no acute abnormality.  The patient had an MRI/MRA performed on  June 14, this showed multifocal pontine and medullary infarcts.  There  were punctate left cerebellar infarcts superimposed on chronic left  cerebellar lacunar infarcts.  There was no associated hemorrhage noted.  The MRA noted poor flow in the vertebral arteries.  A cerebral angiogram  was recommended and this was performed on December 04, 2008, by Dr.  Corliss Skains.  The cerebral angiogram showed severe stenosis of both the  right and left vertebral arteries as well as a 70-75% stenosis at the  origin of the left vertebral artery.  There was a 50% stenosis of the  left internal carotid artery at the petrous cavernous junction.   The patient was eventually admitted to [facility] on December 07, 2008.  He did very well during his  rehab stay.  Dr. Pearlean Brownie recommended stent-assisted angioplasty of the  left vertebral artery once the patient was ready for discharge from the  rehab unit.  The patient is to be discharged tomorrow on June 30, at  that which time he will be admitted to the  acute care side of the [facility] with plans for endovascular treatment of his  cerebrovascular disease to be performed by Dr. Corliss Skains.   PAST MEDICAL HISTORY:  Significant for hypertension, hyperlipidemia, and  diabetes mellitus with diabetic neuropathy.  The patient has severe  peripheral vascular disease.  He is status post amputation of the of the  third right toe, which was performed in 2003.  He does have a history of  paroxysmal atrial fibrillation.  He had some dysphagia as a result of  his stroke.  He was treated by the speech therapist during his rehab  stay and this improved considerably.  The patient had a lower extremity  angiogram performed in 2002 with an attempted PTA of the left  superficial femoral artery, however, this was unsuccessful.  At that  time, the patient was noted to have total occlusion of the left  superficial femoral artery, total occlusion of the left anterior tibial  artery, a 70% stenosis of the right superficial femoral artery, and 95%  stenosis  of the right anterior tibial artery.  The patient also has a  history of a finger injury on the left hand.  He had a recent ingrown  toe nail of the great toe of the left foot that was treated by his  podiatrist.  He is completing a course of Keflex at this time.   ALLERGIES:  The patient is allergic to PENICILLIN, which causes a rash.  He states that ACTOS caused him to go into atrial fibrillation in the  past.   CURRENT MEDICATIONS:  1. Aspirin 325 mg daily.  2. FiberCon at bedtime.  3. Keflex 250 mg q.i.d., which will be completed on 29th.  4. Plavix 75 mg daily.  5. He is currently on Lovenox 40 mg daily.  6. Glyburide 5 mg daily.  7. He is on NovoLog insulin as well as Lantus insulin.  The Lantus is      15 mg daily with 35 mg at bedtime.  8. He is on lisinopril 5 mg b.i.d.  9. Protonix 40 mg daily.  10.Zocor 20 mg at bedtime.  11.Albuterol inhaler p.r.n.   SOCIAL HISTORY:  The  patient lives with his wife in [facility].  He has  1 grown daughter.  He is a retired Chartered certified accountant.  He quit smoking 17 years  ago, although he did smoke up to 3 packs of cigarettes per day for 35  years.  He does not use alcohol.   FAMILY HISTORY:  His mother died in her 33s from congestive heart  failure.  His father died at age 35 from complications of surgery.   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  He has had an open reduction  and internal fixation of the left ankle.  He denies any previous  problems with anesthesia.   REVIEW OF SYSTEMS:  Completely negative except for the following.  He  has an occasional cough.  He feels it is secondary to his dysphagia.  He  has gastroesophageal reflux disease with a hiatal hernia.  He sometimes  has difficulty keeping his food down.  He has occasional hemorrhoidal  bleeding.  As noted, he recently had a left great ingrown toenail.  He  has occasional problems with his balance as a result of his stroke.   PHYSICAL EXAMINATION:  GENERAL:  A pleasant 70 year old male, seated in  a wheelchair.  VITAL SIGNS:  Blood pressure 114/56, pulse 78, respirations 18,  temperature 97.2, and oxygen saturation 95% on room air.  HEENT:  Mild dysarthria with a facial droop.  NECK:  No bruits.  HEART:  Regular rate and rhythm with distant heart sounds.  LUNGS:  Decreased but clear.  ABDOMEN:  Soft and nontender.  EXTREMITIES:  Pulses to be weak to absent.  He has 3+ pitting edema  bilaterally with cyanosis.  He is absent a toe from his right foot.  He  has a healing ingrown toenail on the great toe of the left foot.  His  airway is rated at 1.  His ASA scale is rated at a 4.  NEUROLOGIC:  The patient is alert and oriented and follows commands.  Cranial nerves II through XII reveal a facial droop and mild dysarthria.  Sensation is intact bilaterally except for both lower extremities  where  he has decreased sensation as a result of his diabetic neuropathy.  Motor strength on the right is 4/5, 5/5 on the left.  Cerebellar testing  revealed difficulty with finger-to-nose testing on the right.   LABORATORY DATA:  His BUN is 6, creatinine is 0.5, potassium is 3.9, and  GFR is greater than 60.  His glucose was 54.  Other labs are currently  pending.  A chest x-ray on June 24 showed lingular scarring, but no  active disease.   IMPRESSION:  1. Left cerebellar cerebrovascular accident.  2. Cerebrovascular disease as documented by cerebral angiogram      performed on December 04, 2008, with severe left vertebral artery      stenosis.  3. Remote history of tobacco use, question chronic obstructive      pulmonary disease  4. Severe peripheral vascular disease.  5. Dysphagia is a result of his stroke.  6. Hypertension.  7. Hyperlipidemia.  8. Diabetes mellitus with diabetic neuropathy.  9. History of paroxysmal atrial fibrillation.  10.Ejection fraction of 55-60% by echo on December 04, 2008.  11.History of a left finger injury.  12.History of allergy to PENICILLIN.  13.Status post multiple surgeries as well as treatment for a recent      left ingrown toenail.   PLAN:  The patient will be admitted to [facility] on December 19, 2008, from the [facility] to undergo  stent-assisted angioplasty of the left vertebral artery to be performed  under monitored anesthesia care by Dr. Corliss Skains.      [name], P.A.    ______________________________  Grandville Silos. Corliss Skains, M.D.    DR/MEDQ  D:  12/18/2008  T:  12/19/2008  Job:  [id number]   cc:   Windle Guard, M.D.  Pramod P. Pearlean Brownie, MD  Ranelle Oyster, M.D.  Mick Sell, MD

## 2010-11-04 NOTE — Consult Note (Signed)
NAMEPLEZ, BELTON NO.:  [id number]   MEDICAL RECORD NO.:  [id number]          PATIENT TYPE:  INP   LOCATION:  3032                         FACILITY:  [facility]   PHYSICIAN:  Noel Christmas, MD    DATE OF BIRTH:  29-Dec-1940   DATE OF CONSULTATION:  12/02/2008  DATE OF DISCHARGE:                                 CONSULTATION   REFERRING PHYSICIAN:  Stann Mainland. Sampson Goon, MD   REASON FOR CONSULTATION:  Acute stroke.   HISTORY OF PRESENT ILLNESS:  This is a 70 year old man with complaint of  vertigo intermittently for about 1 month along with increasing speech  difficulty with slurring of speech for about 6 days and progressive  weakness involving his right side for 2 days affecting his ability to  walk.  He has continued to have episodes of vertigo when he stands.  There is no associated nausea.  He has not had diplopia.  The patient  has a long history of hypertension and diabetes mellitus.  CT scan of  his head was unremarkable for an acute intracranial abnormality.  He has  been on aspirin 81 mg per day.  There is no previous history of stroke  or TIA.   PAST MEDICAL HISTORY:  Remarkable for:  1. Diabetes mellitus.  2. Hypertension.  3. Peripheral neuropathy, most likely secondary to diabetes mellitus.   CURRENT MEDICATIONS:  1. Glyburide 2.5 mg per day.  2. Lantus 36 units q.a.m.  3. Aspirin 81 mg per day.   PHYSICAL EXAMINATION:  GENERAL:  Appearance was that of slightly elderly  man who was alert and cooperative, in no acute distress.  He was  oriented well to time as well as place.  Memory was normal.  Affect was  appropriate.  There is no receptive or expressive aphasia.  HEENT:  Pupils were equal and reactive normally to light.  Extraocular  movements and visual fields were normal.  He had moderate right lower  facial weakness.  He was moderately hard of hearing.  Speech was  moderately dysarthric.  MOTOR:  Mild-to-moderate proximal and distal  weakness of his right upper  extremity as well as proximal weakness of his right lower extremity.  Strength of his left extremity was normal.  Muscle tone was normal  throughout.  Deep tendon reflexes are 1+ in the upper extremities and  absent at the knees and ankles.  Plantar responses were mute.  Sensory  exam showed stocking loss of perception of all modalities distal to the  mid calf bilaterally.  Carotid and subclavian auscultation was normal.   CLINICAL IMPRESSION:  1. Stepwise progression of clinical deficits implying stroke and      evolution, most likely thromboembolic phenomena involving the      vertebrobasilar vascular territory.  Cardiac source for potential      emboli cannot be ruled out as well.  2. Moderately severe peripheral neuropathy, most likely secondary to      chronic diabetes mellitus.   RECOMMENDATIONS:  1. CTA of the head and neck to rule out possible thrombus.  2. Echocardiogram in a.m.  3. Would recommend anticoagulation with heparin drip without initial      heparin IV bolus at least until echocardiogram is completed and no      indication of thrombus or embolus source is seen on echocardiogram      as well as with CTA of head and neck.  4. Physical therapy, occupational therapy, and speech therapy      consults.   Thank you for asking me to evaluate Mr. Hoge.      Noel Christmas, MD  Electronically Signed     CS/MEDQ  D:  12/02/2008  T:  12/03/2008  Job:  [id number]

## 2010-11-04 NOTE — Discharge Summary (Signed)
[name], [name]               ACCOUNT NO.:  [id number]   MEDICAL RECORD NO.:  [id number]          PATIENT TYPE:  IPS   LOCATION:  4033                         FACILITY:  [facility]   PHYSICIAN:  Ranelle Oyster, M.D.DATE OF BIRTH:  1940-11-11   DATE OF ADMISSION:  12/07/2008  DATE OF DISCHARGE:  12/19/2008                               DISCHARGE SUMMARY   DISCHARGE DIAGNOSES:  1. Left pontomedullary cerebrovascular accident.  2. Ingrown left great toenail, improving.  3. Diabetes mellitus type 2.  4. Hypertension.  5. Hearing loss with tinnitus and vertigo.   HISTORY OF PRESENT ILLNESS:  [name] is a 70 year old male with  history of diabetes mellitus, peripheral vascular disease with right  third toe ray amputation, admitted on June 13 with 36-month history of  left-sided headache and treated for sinusitis.  Admitted on June 13 with  decreased hearing, weakness on right side and vertigo.  CCT showed no  acute changes.  Neuro evaluated the patient and felt the patient had  left hemisphere thromboembolic infarct.  Cardiac echo showed EF of 55-  60%, calcification of mitral valve with mild regurg.  Carotid Dopplers  done showed severe right ECA stenosis and left 40-50% stenosis  questioned due to tortuosity.  MRA of brain showed multiple pontine and  medullary infarcts on the left and punctate infarct, left cerebellum,  vertebral flow in slow, left greater than right.  Cerebral angio showed  50% stenosis of left ICA 70-75% stenosis at origin of left vertebral  artery and preocclusive stenosis left vertebrobasilar junction and  preocclusive tendon stenosis, right vertebrobasilar junction.  Neuro  recommends aspirin, Plavix for CVA prophylaxis and stent of left  vertebral artery in the CIR stay.  The patient continues with right  hemiparesis, right dysmetria, and severe dysphagia, and BS done showed  severe dysphagia and n.p.o. was recommended.  Currently, Panda tube  feeds  ongoing.  Speech Therapy is doing trials of puree.  The patient is  noted to have poor standing balance for OT task.  Requires cues to  utilize right upper extremity for self-care, noted to have right lower  extremity instability.  The patient was evaluated by a rehab and felt  that he would benefit from CIR program.   PAST MEDICAL HISTORY:  Significant for PAF, right third toe ray  amputation in 2003 for osteomyelitis, left fifth finger injury with  contracture, a tinnitus and vertigo for the past month, peripheral  vascular disease left greater than right lower extremity and left ankle  ORIF fracture.   ALLERGIES:  PENICILLIN and ACTOS.   REVIEW OF SYMPTOMS:  Positive for decreased hearing right ear as well as  tinnitus and weakness and some wheezing.   FAMILY HISTORY:  Positive for coronary artery disease.   SOCIAL HISTORY:  The patient is married, lives in 1-level home with 3  steps at entry and a ramp at entry additionally.  He is a retired  Chartered certified accountant.  He quit tobacco 17 years ago.  Does not use any alcohol.  Wife is retired and can provide supervision past discharge.   FUNCTIONAL  HISTORY:  The patient was independent in driving prior to  admission.   FUNCTIONAL STATUS:  The patient is min assist for upper body care, mod-  to-max assist for lower body care, mod assist for transfers, mod assist  for 5 pivoting steps with support of right hip and right lower extremity  both stance and weight shifting.   PHYSICAL EXAMINATION:  VITAL SIGNS:  Blood pressure 138/65, pulse of 62,  respiratory rate 20, temperature 98.1.  GENERAL:  The patient is pleasant, alert, oriented male, in no acute  distress.  HEENT:  Pupils equal, round, and reactive to light.  Hearing is poor on  right.  Ear, nose, throat exam notable for some slight thrush and  borderline dentition.  NECK:  Supple without JVD or lymphadenopathy.  LUNGS:  Notable for some inspiratory wheezes, right greater than left.   HEART:  Regular rate and rhythm without murmurs or gallops.  EXTREMITIES:  No evidence of clubbing.  No cyanosis.  Mild erythema  noted in right great toe due to ingrown toenail.  Well-healed old  incision, left ankle and right foot, stasis changes in bilateral lower  extremities.  NEUROLOGIC:  Cranial nerves II through XII shows right central VII with  decreased hearing on right.  No frank visual deficits.  Reflexes 1+.  Sensation decreased slightly on right arm as well as distal lower  extremities at the ankle to feet.  Strength is 4/5 right hand and elbow,  3 to 3+/5 right shoulder, right lower extremity is 4/5.  The patient's  left upper and lower extremity are 4+ to 5/5.  The patient has ataxia,  right arm and right leg, with decreased fine motor control as well.  He  has positive pronator drift on right side as well.  Judgment  orientation, memory, mood are functional.  Mild-to-moderate dysarthria  noted.   HOSPITAL COURSE:  [name] was admitted to rehab on December 07, 2008, for inpatient therapy to consist of PT/OT, and speech therapy at  least 3 hours 5 days a week.  Past admission, Panda tube was clipped in  place with tube feeds ongoing.  CBG is checked q.6 h. with sliding scale  insulin for elevated blood sugars.  The patient was maintained on  aspirin, Plavix for CVA prophylaxis throughout the stay.  The patient  was noted to have some wheezing and rhonchi in part due to the [place] in  place.  The breathing treatments were started to help the  symptomatology.  Speech Therapy initially worked with the patient on  p.o. trials.  As swallowing was noted be improving, swallow study was  done on June 22 showing significantly improved deglutition.  However,  the patient continued to be moderate aspiration risk on thin and nectar.  The patient was able to utilize chin tuck to protect his airway during  swallow with nectar thickened liquids.  He was started on D3 diet nectar   liquids.  The speech has continued to follow on the patient throughout  the stay.  Repeat swallow done on June 29 shows much improvement in his  swallow with mild oropharyngeal dysphagia with both silent and audible  penetration and trace amounts of aspiration of thin and nectar liquids.  The patient is able to utilize neck flexion and chin tuck to provide  minimal airway protection.  Speech feels there is notable improvement in  motor sensory function.  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  His diet  has been advanced to regular diet thin liquids, and the patient to  continue with chin tuck avoid straws and follow solids with liquids to  prevent aspiration.  Other issue during this stay has been problems with  frequency.  Initially, the patient was noted to have some voiding  difficulty requiring in-and-out caths.  However, with improved mobility,  the patient was noted to be voiding with PVRs at 50-75 mL.  The patient  with history of frequency and was started on low-dose Ditropan 2.5 mg  p.o. at bedtime.  However, with addition of Ditropan, the patient was  noted to have some issues with retention with bladder volumes up to 300  mL.  Therefore, Ditropan was discontinued.  The patient's blood  pressures have been monitored on b.i.d. basis during this stay.  These  are variable ranging from 120s to 130s systolics.  Overall occasionally,  systolic blood pressures noted to be in 150s range.  Heart rate stable  in 60-70s range.   The patient was started on warm water soaks for right ingrown toenail.  Additionally, Keflex was added for 5 days to help with ingrown toenail  symptomatology.  This toe did improve briefly.  However, once off  antibiotics, the patient was noted to have recurrence of erythema and  tenderness at great toe.  The patient was started on Keflex again on  June 25 and Dr. Ralene Cork, Podiatry, was consulted  for input.  The patient  was also started on vinegar soaks b.i.d. basis.  The patient's  paronychia was I and D'ed and debrided on June 26 by Dr. Ralene Cork.  Triple  antibiotic ointment and dressing changes were applied with  recommendations for daily clip changes with daily cleansing with vinegar  soaks and applying Betadine and Band-Aid past soaking.  The patient to  follow up with Dr. Ralene Cork in 2-3 weeks.  He recommends maintaining  Keflex q.i.d. for now.  Recheck lytes of June 29 revealed sodium 141,  potassium 3.9, chloride 105, CO2 28, BUN 6, creatinine 0.50, glucose 54.  The patient's blood sugars initially were noted to be very reliable  ranging in 200 range.  When tube feeds were discontinued, the patient  was noted to have some hypoglycemic episodes of blood sugars in 70s to  240s range.  The patient's glyburide was increased to 5 mg with p.m.  Lantus dose being decreased to prevent hypoglycemic episodes.  The  patient to continue checking blood sugars on q.i.d. basis past discharge  and follow up with his primary MD for further adjustment in his diabetes  medication.  The patient's mood has been stable.  He has been motivated  and participating in therapy.   During the patient's stay in rehab, physiatrist, rehab, RN, and therapy  team have worked together to provide customized collaborative  interdisciplinary care.  Rehab RN has been working with the patient on  wound care as well as monitoring p.o. intake.  They have also been  working on bowel and bladder program with scheduled toileting due to the  patient's frequency.  Weekly team conferences were held to monitor the  patient's progress, set goals, as well as discuss barriers to discharge.  At time of admission, the patient was noted to be limited by decreased  endurance, decreased balance, impaired sensation and proprioception  deficits, and decrease in activity tolerance.  The patient was mod  assist overall for mobility.   The patient was mod  assist for transfer  due to loss of balance posteriorly, required min assist for standing  balance with bilateral upper extremity support.  He was able to ambulate  75 feet x2 with a rolling walker with min-to-mod assist with loss of  balance with stone, max assist to recover.  The patient with sensory  loss in bilateral lower extremity, which impaired his mobility.  Physical Therapy has been working with the patient on endurance as well  as lower body strengthening exercises just to help with weakness in  right lower extremity.  This patient has made a good progress to being  at modified independent with transfers.  He requires supervision for  ambulating up to 250 feet x2 in open environment.  He requires close  supervision with turns and for obstacle navigation.  Dynamic balance  with activities have emphasized on the patient's balance reaction and  with the patient showing improvement in reaction time.  The patient does  continue to be at increased risk for fall and 24-hour supervision  assistance is recommended past discharge.  The patient is also advised  to continue using rolling walker at all times.  OT has been working with  the patient on self-care.  Currently, the patient has progressed to  being at modified independent for dressing, modified independent for  toileting transfers.  He is demonstrating increased use of right upper  extremity as dominant hand for use with self-care as well as self-feed.  Speech Therapy has been working with the patient on dysphagia treatment  with focus on aspiration precautions.  They also initially worked with  the patient on water protocol while the patient on nectar liquids.  Speech has been working with the patient on chin tuck without verbal  cues as well as throat clearing to help with that vocal quality.  The  patient will continue to receive further follow-up home health PT, OT,  speech therapy, and nurse by advanced  home care past discharge.  On December 19, 2008, the patient is discharged to home.   DISCHARGE MEDICATIONS:  1. Aspirin 325 mg a day.  2. Plavix 75 mg a day.  3. Zocor 20 mg at bedtime.  4. Lantus insulin 15 units in a.m.  5. FiberCon 2 p.o. at bedtime.  6. Protonix 40 mg per day.  7. Prinivil 5 mg b.i.d.  8. Keflex 250 mg q.i.d.  9. Lantus insulin 35 units at bedtime.  10.DiaBeta 5 mg per day.   Diet is regular, modified medium, thin liquids with chin tuck, no  straws.  Follow bite with sip, needs to sit up 1 hour past meals due to  reflux issues.   Activity level is a 24-hour supervision.   FOLLOW:  The patient to follow up with Dr. Riley Kill in 4 weeks.  Follow up  with LMD for medical issues in couple of weeks.  Follow up with Dr.  Pearlean Brownie in 4 weeks.  Follow up with vascular surgery in few weeks.      Greg Cutter, P.A.      Ranelle Oyster, M.D.  Electronically Signed    PP/MEDQ  D:  12/18/2008  T:  12/19/2008  Job:  [id number]   cc:   Dr. Jeannetta Nap  Dr. Ezzard Standing  Dr. Pearlean Brownie  Dr. Corliss Skains

## 2010-11-04 NOTE — H&P (Signed)
[name], [name] NO.:  [id number]   MEDICAL RECORD NO.:  [id number]          PATIENT TYPE:  IPS   LOCATION:  4033                         FACILITY:  [facility]   PHYSICIAN:  Ranelle Oyster, M.D.DATE OF BIRTH:  [birth year]   DATE OF ADMISSION:  12/07/2008  DATE OF DISCHARGE:                              HISTORY & PHYSICAL   PRIMARY CARE PHYSICIAN:  Windle Guard, MD   ENT:  Kristine Garbe. Ezzard Standing, MD   CARDIOLOGIST:  [facility].   CHIEF COMPLAINT:  Right-sided weakness.   HISTORY OF PRESENT ILLNESS:  This is a 70 year old white male with  diabetes and history of right third toe amputation, admitted On December 02, 2008, with a month history of left-sided headache and had been treated  for sinusitis.  He was admitted on December 02, 2008, for decreased hearing  and weakness in the right side with vertigo.  CT initially was without  change.  Neurology was consulted and felt the patient suffered a left  hemispheric/thromboembolic infarct.  Cardiac echo was notable for EF of  55-60 with calcification of the mitral valve with mild regurgitation.  Carotid Dopplers showed severe right ECA stenosis and 40-59% left-sided  stenosis, possibly due to tortuosity.   MRI and MRA of the brain showed multifocal pontine and medullary  infarcts on the left as well as punctate infarction in the left  cerebellum with poor vertebral flow, left greater than right.  Cerebral  angiogram showed 50% stenosis of left ICA, 70-75% stenosis at the origin  of the left vertebral artery, and pre-occlusive disease in left  vertebral basilar junction precludes the tandem stenosis at the right  vertebral basilar junction.  Neurology recommend aspirin and Plavix for  stroke prophylaxis and stent of left vertebral artery at the end of his  rehab stay.  The patient continues to have deficits and Rehab was  consulted on December 03, 2008, and felt that the patient could benefit  ultimately from an  inpatient rehab admission and ultimately he was  brought here today.   REVIEW OF SYSTEMS:  Notable for decreased hearing in the right ear as  well as tinnitus and weakness.  His bowel and bladder have been  functioning.  He denies frank pain.  He has had some wheezing.  Other  pertinent positives are above and full reviews in the written H&P.   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   FAMILY HISTORY:  Positive for CAD.   SOCIAL HISTORY:  The patient is married.  He lives in a 1-level house, 3  steps to enter with a ramp.  He is a retired Chartered certified accountant.  He quit tobacco  17 years ago and does not drink.  Wife is retired and can provide  assistance at discharge.   ALLERGIES:  PENICILLIN and ACTOS.   MEDICATIONS:  Glyburide, Lantus, insulin, aspirin  81 mg daily, and blood  pressure meds, possibly lisinopril.   LABORATORY DATA:  Hemoglobin is 13.4, white count 10.1, and platelets  121.  Potassium 3.8, BUN 12, and creatinine 0.56.   PHYSICAL EXAMINATION:  VITAL SIGNS:  Blood pressure is 138/65, pulse is  60, respiratory rate 20, and temperature 98.1.  Blood sugars have been  in the mid 200s.  GENERAL:  The patient is pleasant, alert, and oriented x3.  HEENT:  Pupils are equal, round, and reactive to light.  Ear, nose, and  throat exam is notable for some slight thrush and borderline dentition.  The patient's hearing is poor on the right.  NECK:  Supple without JVD or lymphadenopathy.  CHEST:  Notable for wheezes, expiratory and inspiratory, left more than  right.  HEART:  Regular rate and rhythm without murmurs, rubs, or gallops.  EXTREMITIES:  No clubbing, cyanosis, or edema.  ABDOMEN:  Soft and nontender.  Bowel sounds are  positive.  SKIN:  Notable for some mild redness on the heels and a few areas of  bruising/breakdown on the toes of the right foot.  Otherwise, skin was  intact.  NEUROLOGIC:  Cranial nerves II-XII showed right central VII and  decreased hearing on the right, but no frank visual deficits today.  Reflexes are 1+ and sensation is decreased slightly in the right arm as  well as the distal lower extremities at the ankles to feet.  Strength  was 4/5 right hand and elbow, 3 to 3+/5 right shoulder today.  Right leg  strength was near 4/5.  The patient's left upper and lower extremity  were 4+ to 5/5.  The patient with ataxia right arm and leg today and  decreased fine motor coordination as a whole.  He had positive pronator  drift on the right side as well.  Judgment, orientation, memory, and  mood are all functional today.   POST-ADMISSION PHYSICIAN EVALUATION:  1. Functional deficits secondary to left pontomedullary stroke with      right hemiparesis, dysarthria, dysmetria, and dysphagia.  2. The patient is admitted to receive collaborative interdisciplinary      care between the physiatrist, rehab nursing staff, and therapy      team.  3. The patient's level of medical complexity and substantial therapy      needs in context of that medical necessity cannot be provided at a      lesser intensity of care.  4. The patient has experienced substantial functional loss from his      baseline.  Premorbidly, the patient was independent driving.  As of      the rehab evaluation on December 03, 2008, the patient had not been      evaluated by PT or OT.  Within the last 24 hours, the patient has      been min assist upper body care, mod to max assist lower body care,      mod assist transfer, mod assist 5 pivot steps with the support of      right hip for stance.  Based on the patient's diagnosis, physical      exam, and functional history, he has potential functional progress      which will result in  measurable gains while inpatient rehab.  These      gains will be of substantial and practical use upon discharge to      home in facilitating mobility and self-care.  Interim changes in      medical  status since our rehab consult are detailed above.  5. Physiatrist will provide 24-hour management of medical needs as      well as oversight of therapy plans/treatment and provide guidance      as appropriate regarding interaction of the two.  Medical problem      list and plan are listed below.  6. 24-Hour Rehab Nursing will assist in management of the patient's      bowel and bladder function as well as skin care, nutrition, pain      management, and integration of therapy concepts and techniques.  7. PT will assess and treat for lower extremity strength,      coordination, adaptive techniques and equipment, visual perceptual      training, neuromuscular reeducation, and safety education with      goals supervision to modified independent.  8. OT will assess and treat for upper extremity use, ADLs,      coordination, neuromuscular reeducation, cognitive perceptual      training and adaptive techniques, and equipment with goals      supervision to modified independent.  9. Speech Language Pathology will assess and treat for dysphagia with      goals modified independent.  10.Case Management and Social Worker will assess and treat for      psychosocial issues and discharge planning.  11.Team conferences will be held weekly to assess progress towards the      goals and to determine barriers to discharge.  12.The patient has demonstrated sufficient medical stability and      exercise capacity to tolerate at least 3 hours of therapy per day      at least 5 days per week.  13.Estimated length of stay is 2-3 weeks.  Prognosis is good.   MEDICAL PROBLEM LIST AND PLAN:  1. Left vertebral artery stenosis:  The patient is scheduled for      approximately 2 weeks coincide with rehab discharge.   2. Diabetes type 2:  We will follow his sugars as they have been      poorly controlled.  This is likely due to tube feeds.  He will      continue on Lantus insulin and coverage sliding scale insulin as      well.  The patient also is taking glyburide at home in the      afternoon.  3. Dyslipidemia:  Zocor.  4. Vestibular symptoms:  We will have therapy perform vestibular      evaluation and treat as appropriate.  These are less likely due to      the patient's posterior circulation disease.      Ranelle Oyster, M.D.  Electronically Signed     ZTS/MEDQ  D:  12/07/2008  T:  12/08/2008  Job:  [id number]

## 2010-11-04 NOTE — Discharge Summary (Signed)
[name], [name]               ACCOUNT NO.:  [id number]   MEDICAL RECORD NO.:  [id number]          PATIENT TYPE:  AMB   LOCATION:  SDS                          FACILITY:  [facility]   PHYSICIAN:  Sanjeev K. Deveshwar, M.D.DATE OF BIRTH:  1940/12/02   DATE OF ADMISSION:  12/19/2008  DATE OF DISCHARGE:  12/19/2008                               DISCHARGE SUMMARY   ADDENDUM   Please see the history and physical that I previously dictated on December 19, 2008.  This patient was to be admitted to [facility] on  December 19, 2008, to undergo stent-assisted angioplasty of a severely  stenosed left vertebral artery to be performed under monitored  anesthesia care by Dr. Corliss Skains at the request of Dr. Pearlean Brownie.  The  angiogram was performed; however, Dr. Corliss Skains did not feel that  intervention was indicated.  Please see his complete dictation for full  details.  The patient was discharged to home on December 19, 2008, after  recovering from the angiogram.      [name], P.A.    ______________________________  Grandville Silos. Corliss Skains, M.D.    DR/MEDQ  D:  12/21/2008  T:  12/22/2008  Job:  [id number]   cc:   Windle Guard, M.D.  Pramod P. Pearlean Brownie, MD  Ranelle Oyster, M.D.  Mick Sell, MD

## 2010-11-04 NOTE — Assessment & Plan Note (Signed)
[name] is here in followup of his left pontomedullary stroke.  He  was discharged home in late June and has been home with home therapy.  He is discharged from therapy and is independent, essentially walking  without his cane.  He had some falls a few weeks ago related to some  dizziness and problems with his balance when moving to his feet.  However, he seems to be getting better.  He occasionally uses the cane  when he walks outside the home.  He is mowing the grass.  He is using  his riding mower now.  He has had some dental issues and is in need of  tooth pull, but his doctor did not want to take him off his Plavix as of  yet until he heard from another physician.  The patient denies pain  today.  His mood has been excellent.  He states his sugars have been  under control.  He has occasional bruising is related to his Plavix.   REVIEW OF SYSTEMS:  Notable for the above.  Full 14-point review is in  the written health and history section of the chart.   SOCIAL HISTORY:  Unchanged.  He is married and living with his wife, is  supportive.   PHYSICAL EXAMINATION:  VITAL SIGNS:  Blood pressure 188/74, pulse 75,  and respiratory rate 18.  He is sating 97% on room air.  GENERAL:  The patient is pleasant, alert, and oriented x3.  Continues to  have mild decrease in his coordination on the right side with decreased  sensation in both feet distally.  He is contracting right fifth finger  and flexion.  NEUROLOGIC:  Cognitively, he is fairly intact.  Speech is clear.  No  focal cranial nerve deficits were appreciated today.  No gaze deficits  or vertigo was seen.  HEART:  Regular rate.  CHEST:  Clear.  ABDOMEN:  Soft, nontender.   ASSESSMENT:  1. Left pontomedullary stroke.  2. Type 2 diabetes.  3. Hypertension.   PLAN:  1. The patient has done extremely well at this point.  I recommended      ongoing physical activities at home.  He wanted to fine tune his      balance.  We  could pursue outpatient therapy, although I think he      is far enough long that we could forego this.  2. He needs followup with his family physician regarding his overall      medical care particularly his blood pressure.  3. I would wait another month if possible to pursue his dental      procedure.  He should be off his Plavix about 5-7 days prior to the      tooth removal.  It does not sound as if it is exactly emergent to      this point.  4. I will see the patient back on as-needed basis in the future.      Ranelle Oyster, M.D.  Electronically Signed    ZTS/MedQ  D:  02/05/2009 10:28:30  T:  02/05/2009 12:51:47  Job #:  [id number]   cc:   Windle Guard, M.D.  Fax: [phone]

## 2010-11-04 NOTE — Discharge Summary (Signed)
NAMEVITTORIO, MOHS NO.:  [id number]   MEDICAL RECORD NO.:  [id number]          PATIENT TYPE:  INP   LOCATION:  3032                         FACILITY:  [facility]   PHYSICIAN:  Mick Sell, MD DATE OF BIRTH:  [birth year]   DATE OF ADMISSION:  12/02/2008  DATE OF DISCHARGE:  12/07/2008                               DISCHARGE SUMMARY   PRIMARY CARE PHYSICIAN:  Dr. Jeannetta Nap.   DISCHARGE DIAGNOSES:  1. Left brain stem stroke - multifocal pontine and medullary infarct      per MRI, left cerebellar chronic and punctate strokes, the patient      transferred to inpatient rehab.  2. Dysphagia - secondary to number one, transferred to inpatient rehab      to continue speech and swallow therapy, transferred with Panda      tube.  3. Hypertension, blood pressure stable during the hospitalization.  4. Hyperlipidemia.  5. Diabetes - insulin dependent at home, 36 units of Lantus.  6. Peripheral vascular disease.  7. Status post amputation of the right third toe in 2003.  8. History of paroxysmal atrial fibrillation.  9. Penicillin gives rash.   DISCHARGE MEDICATIONS:  1. Lantus 36 units inject under the skin every day at bedtime.  2. Aspirin 81 mg take one tablet daily.  3. Plavix 75 mg tablet once daily.  4. Zocor 20 mg take once daily at bedtime.  5. Glyburide 2.5 mg once daily.   DISPOSITION AND FOLLOWUP:  The patient was transferred from hospital  regular bed unit to inpatient rehab.  He was transferred in stable  condition and will continue speech and swallow therapy, as well as  PT/OT.  Plan per neurology is to do the stenting after the patient  completes rehab.  In addition, while the patient continuing speech and  swallow therapy, plan is to remove Panda tube so that the patient can  continue p.o. diet.  He will follow up with his PCP two weeks after  discharge from rehab.  Risk management important in this patient.  Control hypertension, hyperlipidemia, and  diabetes.  The patient was  discharged on Zocor, aspirin, Plavix, and Lantus.   CONSULTATIONS:  Neurology, Dr. Corliss Skains and Dr. Pearlean Brownie and Dr. Roseanne Reno.   PROCEDURES:  1. December 02, 2008 CT of the head showed no bleed, no acute process,      atrophy and nonspecific white matter changes.  2. December 03, 2008 CXR:  Lungs clear.  Heart size normal.  No effusions.  3. December 04, 2008 MRI/MRA:  Multifocal pontine and medullary infarcts,      punctate left cerebellar infarct on chronic left cerebellar      lacunes.  No mass effect.  No hemorrhage.  MRA:  Poor vertebral      artery flow, especially left more than right.  Proximal carotids      not well evaluated.  No significant distal common carotid or      cervical internal carotid artery stenosis.  Poor flow distal,      vertebral arteries plus basilar arteries.  Bilateral cavernous  internal carotid artery and right MCA atherosclerotic without      hemodynamically significant compromise.  Right MCA M1 segment      showed significant involvement.  4. December 04, 2008 AXR.  Panda tube in stomach.  5. December 04, 2008 carotid Dopplers.  Right internal carotid artery      shows no stenosis, left internal carotid artery 40-60% stenosis.  6. December 05, 2008 2-D echo.  Left ventricular systolic function normal.      Ejection fraction 55-60%, mild mitral regurgitation.  7. December 05, 2008 cerebral angio.  Bilateral vertebral basilar junction      stenosis, vertebral artery at origin 70-75% stenosis.   HISTORY OF PRESENT ILLNESS:  The patient is a 70 year old male with  diabetes, hypertension, and peripheral vascular disease presented to  [facility] ED with decreased hearing and right-sided weakness that began  approximately one month prior to admission.  The patient had left-sided  headaches involving his temporal ear area and was treated by Dr. Jeannetta Nap  with Cipro and meclizine for presumed vertigo and tinnitus.  Then, his  hearing began to get worse.  The  patient was also referred to ENT to Dr.  Ezzard Standing for a hearing test and had first round of testing without any  acute findings and then five days prior to admission, he had repeat  testing and irrigation of the lateral ears.  At that point, he drove  home okay and noticed decreased hearing after that, weakness and vertigo  and woke up the day after and could not hear or walk.  He had diplopia  and drooling, right-sided weakness, and dysphagia.  No chest pain.  No  shortness of breath.  No palpitations or edema.   PHYSICAL EXAMINATION:  VITALS:  Temperature 97.3, blood pressure 130/54,  pulse 59, respirations 23, saturating 100% on 2 L.   GENERAL:  Not in acute distress.  Right facial droop noted.  Difficulty  hearing and slurred speech.  HEENT:  Atraumatic head.  PERRLA.  EOMI.  Decreased hearing bilaterally.  Bilateral tympanic membranes normal.  NECK:  Supple.  No JVD.  No carotid bruits.  No thyroid enlargement.  LUNGS:  Diffuse rhonchi and wheezes on the right side.  HEART:  Bradycardic.  Distant heart sounds.  No murmur.  ABDOMEN:  Bowel sounds present, infrequent, obese, soft, mildly tender  to palpation in right upper quadrant.  No rebound or guarding.  EXTREMITIES:  No edema, positive stigmata of PVD on skin.  SKIN:  No rashes.  NEUROLOGICAL:  Alert and oriented times four.  Cranial nerves II-XII  within normal limits except right facial droop, dysarthria, and  decreased hearing.  Decreased sensation on the right upper and lower  extremities, otherwise intact.  Strength 4/5 on the right, 5/5 on the  left.  Finger to nose difficult on the right.  PSYCH:  Affect appears normal.   LABORATORY DATA:  Sodium 140, potassium 4.0, chloride 108, bicarb 28,  BUN 9, creatinine 0.54, glucose 211.  WBC 13.8, ANC 10.5, hemoglobin 14.9, platelets 159, MCV 89.  LFTs:  Bilirubin 0.8, alkaline phosphatase 49, AST 17, ALT 27, protein  5.8, albumin 2.6, calcium 8.4.   HOSPITAL COURSE:  1. Left  brain stem stroke evident on MRI, multifocal pontine and      medullary infarcts, old subpunctate left cerebellar infarct      superimposed on chronic left cerebellar lacunes, also noted poor      flow in vertebral basilar artery with CT  angiography indicating 70-      75% bilateral stenosis in vertebral basilar junction area      bilaterally.  Neurology managing the patient.  The patient was      started on aspirin and Plavix and plan per neurology was to perform      stenting once the patient completes rehab.  The patient will be      transferred to inpatient rehab for continuation of PT/OT.  The      patient will also receive speech and swallow therapy.  2-D echo did      not show any acute findings, normal left ventricular ejection      fraction 55-60% with mild mitral regurgitation.  Risk factor      management important in this patient.  Control hyperlipidemia,      diabetes, and hypertension.   1. Dysphagia - secondary to #!.  A Panda tube was placed during the      hospitalization and speech and swallow therapy was continued.  Plan      is to remove Panda once the patient passes the test.  Improvement      noted on day of discharge in terms of swallowing and speech      therapy.   1. Hypertension.  Blood pressure very well controlled during the      hospitalization without medicines.  This will be deferred to      primary care physician to start the patient on hypertensive      medicines if indicated.  For right now, no blood pressure      medication indicated.   1. Diabetes.  The patient will continue home dose Lantus and glyburide      once he is discharged from inpatient rehab and while in rehab will      receive Lantus and sliding scale insulin.  Hemoglobin A1c during      the admission 9.   VITALS ON DISCHARGE:  Temperature 98, blood pressure 130/67, pulse 57,  respirations 18, saturating 96% on room air.   LABS:  WBC 10.1, hemoglobin 13.4, platelets 165 (June 18).    June 16 BMET:  Sodium 141, potassium 3.8, chloride 107, bicarb 26, BUN  12, creatinine 0.56, glucose 163.   Over 30 minutes were spent on discharging the patient.      Mliss Sax, MD  Electronically Signed      Mick Sell, MD  Electronically Signed    IM/MEDQ  D:  12/07/2008  T:  12/07/2008  Job:  [id number]   cc:   Sanjeev K. Corliss Skains, M.D.

## 2010-11-04 NOTE — Consult Note (Signed)
[name], [name]               ACCOUNT NO.:  [id number]   MEDICAL RECORD NO.:  [id number]          PATIENT TYPE:  OUT   LOCATION:  XRAY                         FACILITY:  [facility]   PHYSICIAN:  Sanjeev K. Deveshwar, M.D.DATE OF BIRTH:  Jul 04, 1940   DATE OF CONSULTATION:  01/09/2009  DATE OF DISCHARGE:  01/09/2009                                 CONSULTATION   CHIEF COMPLAINT:  Cerebrovascular disease.   BRIEF HISTORY:  This is a pleasant 70 year old male who was admitted to  [facility] on December 02, 2008, with the left brain stem CVA.  A  CT scan on December 02, 2008, showed no acute abnormality.  An MRI/MRA on  December 03, 2008, showed multifocal pontine medullary infarcts.  There were  also punctate left cerebellar infarcts superimposed on chronic left  cerebellar lacunar infarcts.  There was no associated hemorrhage.  The  MRA noted poor flow in the vertebral arteries.   A cerebral angiogram was recommended and performed by Dr. Corliss Skains on  December 04, 2008.  This showed vertebral artery stenosis as well.  The  patient was eventually admitted to [facility].  Dr. Pearlean Brownie recommended a repeat cerebral angiogram at the end of  the rehab stay with possible stent-assisted angioplasty for his  vertebral artery stenosis.  On December 19, 2008, Dr. Corliss Skains did perform  the cerebral angiogram.  At that time, a decision was made not to  proceed with the stent-assisted angioplasty as Dr. Corliss Skains felt that  the patient was asymptomatic.  His initial deficits were improving and  he did not feel that the stenosis was severe enough to warrant the risk  of treatment at this time.  The angiogram at that time had shown  bilateral severe vertebrobasilar artery stenosis distal to the origin of  the posterior inferior cerebellar arteries with flow demonstrated into  the basilar artery.  It also revealed a 50% stenosis of the left  vertebral artery at its origin.   The  patient returns today accompanied by his wife to be seen in  followup.   PAST MEDICAL HISTORY:  Significant for hypertension, hyperlipidemia,  diabetes mellitus with diabetic neuropathy, severe peripheral vascular  disease.  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  He has a history of paroxysmal atrial fibrillation.  He has had  dysphagia as a result of his stroke in 2002.  He had an attempted PTA of  the left superficial femoral artery.  However, this was not successful.  He was noted to have a total occlusion of the left superficial femoral  artery as well as total occlusion of the left anterior tibial artery, a  70% stenosis of the right superficial femoral artery, and a 95% stenosis  of the right anterior tibial artery.  The patient also has a history of  an injury to his left hand.   ALLERGIES:  He is allergic to PENICILLIN.  He is allergic to ACTOS.   CURRENT MEDICATIONS:  The patient did not bring the list of his  medications to this visit.  He  states he is currently taking:  1. Zocor.  2. Plavix.  3. Glyburide.  4. Aspirin 325 mg daily.  5. Lisinopril.  6. Lantus insulin.   SOCIAL HISTORY:  The patient lives with his wife in  [facility].  He  has 1 grown daughter.  He is a retired Chartered certified accountant.  He quit smoking 17  years ago.  He did smoke up to 3 packs of cigarettes per day for 35  years.  He does not use alcohol.   FAMILY HISTORY:  His mother died in her 37s from congestive heart  failure.  His father died at age 70 from complications of surgery.   SURGICAL HISTORY:  The patient had the amputation of his right third toe  performed in 2003 secondary to osteomyelitis.  He has had an open  reduction and internal fixation of the left ankle.  He denies previous  problems with anesthesia.   IMPRESSION AND PLAN:  The patient and his wife met with Dr. Corliss Skains  today.  The patient reported that he feels he is continuing to improve  following his stroke.   He is participating in home health, physical,  occupational, and speech therapy.  He had a speech and swallowing eval  repeated today; however, he reports that this apparently showed no  improvement, although he is able to eat a regular diet.  He has  continued difficulty with thin liquids.   The patient has had occasional dizzy spells when getting out of bed.  These have not been severe.  He has a followup appointment with Dr.  Riley Kill next week, the rehab physician.   Dr. Corliss Skains reviewed the images from the recent cerebral angiogram  with the patient and his wife.  He recommended continued therapy with  aspirin and Plavix.  The patient is to contact us if he has any new  stroke-like symptoms.  Further recommendations have been for a repeat  ultrasound in approximately 3 months to further evaluate the patient's  carotid and vertebral artery stenosis.  Greater than 15 minutes was  spent on this followup visit.  All of their questions were answered.      [name], P.A.    ______________________________  Grandville Silos. Corliss Skains, M.D.    DR/MEDQ  D:  01/09/2009  T:  01/10/2009  Job:  [id number]   cc:   Windle Guard, M.D.  Pramod P. Pearlean Brownie, MD

## 2010-11-07 NOTE — Op Note (Signed)
[place]. [facility]  Patient:    [name], [name] Visit Number: [id number] MRN: [id number]          Service Type: MED Location: 5000 [id number] Attending Physician:  Nadara Mustard Dictated by:   Nadara Mustard, M.D. Proc. Date: 10/07/01 Admit Date:  10/05/2001                             Operative Report  PREOPERATIVE DIAGNOSIS:  Status post irrigation and debridement and third ray amputation, right foot, with an open wound.  POSTOPERATIVE DIAGNOSIS:  Status post irrigation and debridement and third ray amputation, right foot, with an open wound.  PROCEDURE:  Irrigation and debridement with debridement and removal of devitalized bone, muscle, skin, and soft tissue, with a pulse lavage and delayed wound closure.  SURGEON:  Nadara Mustard, M.D.  ANESTHESIA:  General.  ESTIMATED BLOOD LOSS:  Minimal.  ANTIBIOTICS:  Gentamicin 80 mg IV.  DISPOSITION:  To postanesthesia care unit in stable condition.  INDICATIONS:  The patient is a 70 year old gentleman who is at two days status post a third ray amputation for a large abscess and osteomyelitis with ischemic necrotic third toe, who has undergone a pulse lavage and hydrotherapy yesterday, and presents today for a repeat irrigation and debridement and closure of the wound.  The risks and benefits were discussed, including infection, neurovascular injury, and a need for a higher level of amputation. The patient states he understands and wishes to proceed at this time.  DESCRIPTION OF PROCEDURE:  The patient was brought to the operating room #4 and underwent a general anesthetic.  After an adequate level of anesthesia was obtained, the patients right lower extremity was prepped using Betadine paint and scrub, and draped into a sterile field.  The wound was irrigated with pulse lavage for 3 L.  All edges of the wound were debrided of skin, soft tissue, muscle, and tendon, back to bleeding viable tissue.   The proximal aspect of the metatarsal was also debrided back of bone.  The wound was again irrigated.  There were good bleeding wound edges.  Hemostasis was obtained with electrocautery.  The wound was closed using a far-near, near-far stitch on the plantar and dorsal wounds.  There was no tension on the skin.  The wound was covered with Adaptic, orthopedic sponges, sterile Kerlix, and a loosely wrapped Coban.  The patient was extubated and taken to the postanesthesia care unit in stable condition.  PLAN:  For IV Kefzol and gentamycin postoperatively. Dictated by:   Nadara Mustard, M.D. Attending Physician:  Nadara Mustard DD:  10/07/01 TD:  10/08/01 Job: [id number]

## 2010-11-07 NOTE — Cardiovascular Report (Signed)
[place]. [facility]  Patient:    [name], [name]                      MRN: [id number] Proc. Date: 07/12/00 Adm. Date:  [id number] Attending:  Silvestre Mesi CC:         Bernadene Person, M.D.  Peripheral vascular Lab   Cardiac Catheterization  PROCEDURE: 1. Sheath insertion in the right femoral artery. 2. Catheter placement in the abdominal aorta and left superficial femoral    artery with pressure recordings. 3. Abdominal aortogram with bilateral lower extremity runoff. 4. Attempted PTA of the left superficial femoral artery. 5. Attempted Perclose of the right femoral artery.  INDICATIONS:  This 70 year old male has a history of diabetes, hypertension, hypercholesterolemia, and severe left lower extremity claudication.  He recently had several sores on his left foot which were very slow to heal and caused mild ulceration.  He was then scheduled for evaluation of his lower extremities which showed a severe lesion in his left femoral popliteal area with marked decrease in ABIs.  He was scheduled for angiography and possible angioplasty.  DESCRIPTION OF PROCEDURE:  After signing an informed consent, the patient was premedicated with 50 mg of Benadryl intravenously and brought to the vascular angiography lab on the sixth floor of [facility].  His right groin was prepped and draped in a sterile fashion and anesthetized locally with 1% lidocaine.  A 5 French introducer sheath was inserted percutaneously into the right femoral artery.  A 5 French pigtail catheter was inserted through the right femoral artery sheath and advanced to the abdominal aorta. Pressures were recorded and injections were made into the abdominal aorta visualizing the aorta and pelvic arteries.  The tip of the pigtail catheter was then engaged into the ostium of the left iliac artery and the Seldinger wire was advanced into the iliac and femoral artery.  The  pigtail catheter was then exchanged for a straight end-hole catheter which was advanced over the guide wire and into the left femoral artery.  Injections were made through this end-hole catheter with runoff angiography performed from the left groin to the left foot.  We then selected a Wooley guide wire which was advanced through this end-hole catheter and into the superficial femoral artery. The end-hole catheter was then advanced into the left SFA and multiple attempts at passing the [facility] wire through the totally occluded segment in the left SFA were unsuccessful.  We then exchanged this wire for a long glide wire which was advanced through the end-hole catheter and multiple attempts at passing this through the left superficial femoral occlusion were unsuccessful.  We then went through the end-hole catheter and injections were made through the right femoral artery sheath visualizing the right lower extremity arterial system from the right groin to the right foot.  The patient tolerated the procedure well and no complications were noted at the end of the procedure. The catheter and sheath were removed from the right femoral artery and an attempt to close the right femoral artery puncture site with a Perclose system was unsuccessful.  Successful hemostasis was obtained and with standard pressure technique over the right femoral artery puncture site.  The patient was then returned to the short stay unit for further monitoring prior to anticipated discharge later this evening.  PRESSURE FINDINGS:  Aortic pressure 160/76 with mean of 89.  CINE FINDINGS:  Abdominal aortogram.  The abdominal aorta is mildly  irregular in the infrarenal segment.  There is no significant stenosis and no evidence for aneurysm.  The renal arteries appear normal.  Common iliac arteries both appear normal.  External iliac arteries have minor irregularities, but have normal flow and are essentially normal.  Left  lower extremity.  The left common femoral artery has an eccentric 30% stenosis.  The proximal left superficial femoral artery has only a minor irregularity and has good flow in the profundus.  The mid to distal left SFA has a long segmental lesion first with a focal 70% stenosis followed by a totally occluded segment of approximately 3 cm in length.  There were multiple collaterals and reconstitution of the distal third followed by a fairly normal appearing popliteal artery.  The left anterior tibial is totally occluded in its proximal segment and has reconstitution at the ankle.  The peroneal and posterior tibial arteries appear normal and have good runoff to the foot.  Right lower extremity.  The right femoral artery has a focal eccentric 30% stenosis similar to the left.  The superficial femoral artery on the right has a 70% focal eccentric stenosis in the distal third with normal appearing popliteal.  The right anterior tibial has an 80% stenosis proximally followed by a critical 95% stenosis which is focal and in the proximal third of this vessel.  There is antegrade flow very slowly through the anterior tibial and there is good antegrade flow in the peroneal and posterior tibial on the right.  FINAL DIAGNOSES: 1. Total occlusion of the left superficial femoral artery. 2. Total occlusion of the left anterior tibial artery. 3. 70% stenosis of the right superficial femoral artery. 4. 95% stenosis of the right anterior tibial artery. 5. Unsuccessful attempt to cross the totally occluded left superficial    femoral lesion. 6. Unsuccessful attempt to Perclose the right femoral artery.  DISPOSITION:  The patient will be monitored on the shortstay unit prior to discharge later today.  I feel that he is a surgical candidate for bypass graft surgery of the left superficial femoral artery because of his history of severe claudication and very slow healing lesions in his left foot. DD:   07/12/00 TD:  07/12/00 Job: [id number]

## 2010-11-28 ENCOUNTER — Encounter (HOSPITAL_BASED_OUTPATIENT_CLINIC_OR_DEPARTMENT_OTHER): Payer: Medicare Other | Admitting: Oncology

## 2010-11-28 DIAGNOSIS — Z452 Encounter for adjustment and management of vascular access device: Secondary | ICD-10-CM

## 2010-11-28 DIAGNOSIS — C8291 Follicular lymphoma, unspecified, lymph nodes of head, face, and neck: Secondary | ICD-10-CM

## 2010-12-26 ENCOUNTER — Other Ambulatory Visit: Payer: Self-pay | Admitting: Urology

## 2010-12-26 ENCOUNTER — Encounter (HOSPITAL_COMMUNITY): Payer: Medicare Other

## 2010-12-26 LAB — CBC
HCT: 45.3 % (ref 39.0–52.0)
MCV: 87.1 fL (ref 78.0–100.0)
RBC: 5.2 MIL/uL (ref 4.22–5.81)
RDW: 12.9 % (ref 11.5–15.5)
WBC: 12.5 10*3/uL — ABNORMAL HIGH (ref 4.0–10.5)

## 2010-12-26 LAB — BASIC METABOLIC PANEL
CO2: 31 mEq/L (ref 19–32)
Calcium: 9.2 mg/dL (ref 8.4–10.5)
GFR calc non Af Amer: >60 mL/min (ref >60)
Potassium: 4.6 mEq/L (ref 3.5–5.1)
Sodium: 139 mEq/L (ref 135–145)

## 2010-12-26 LAB — SURGICAL PCR SCREEN: Staphylococcus aureus: NEGATIVE

## 2011-01-01 ENCOUNTER — Ambulatory Visit (HOSPITAL_COMMUNITY)
Admission: RE | Admit: 2011-01-01 | Discharge: 2011-01-01 | Disposition: A | Discharge status: Home / Self Care | Payer: Medicare Other | Source: Ambulatory Visit | Attending: Urology | Admitting: Urology

## 2011-01-01 ENCOUNTER — Ambulatory Visit (HOSPITAL_COMMUNITY): Payer: Medicare Other

## 2011-01-01 DIAGNOSIS — R9431 Abnormal electrocardiogram [ECG] [EKG]: Secondary | ICD-10-CM | POA: Insufficient documentation

## 2011-01-01 DIAGNOSIS — Z7902 Long term (current) use of antithrombotics/antiplatelets: Secondary | ICD-10-CM | POA: Insufficient documentation

## 2011-01-01 DIAGNOSIS — E119 Type 2 diabetes mellitus without complications: Secondary | ICD-10-CM | POA: Insufficient documentation

## 2011-01-01 DIAGNOSIS — IMO0002 Reserved for concepts with insufficient information to code with codable children: Secondary | ICD-10-CM | POA: Insufficient documentation

## 2011-01-01 DIAGNOSIS — Z8673 Personal history of transient ischemic attack (TIA), and cerebral infarction without residual deficits: Secondary | ICD-10-CM | POA: Insufficient documentation

## 2011-01-01 DIAGNOSIS — I4891 Unspecified atrial fibrillation: Secondary | ICD-10-CM | POA: Insufficient documentation

## 2011-01-01 DIAGNOSIS — N480 Leukoplakia of penis: Secondary | ICD-10-CM | POA: Insufficient documentation

## 2011-01-01 DIAGNOSIS — Z79899 Other long term (current) drug therapy: Secondary | ICD-10-CM | POA: Insufficient documentation

## 2011-01-01 DIAGNOSIS — C8589 Other specified types of non-Hodgkin lymphoma, extranodal and solid organ sites: Secondary | ICD-10-CM | POA: Insufficient documentation

## 2011-01-01 DIAGNOSIS — Z01812 Encounter for preprocedural laboratory examination: Secondary | ICD-10-CM | POA: Insufficient documentation

## 2011-01-01 DIAGNOSIS — Z01810 Encounter for preprocedural cardiovascular examination: Secondary | ICD-10-CM | POA: Insufficient documentation

## 2011-01-01 DIAGNOSIS — I10 Essential (primary) hypertension: Secondary | ICD-10-CM | POA: Insufficient documentation

## 2011-01-01 DIAGNOSIS — Z794 Long term (current) use of insulin: Secondary | ICD-10-CM | POA: Insufficient documentation

## 2011-01-01 LAB — GLUCOSE, CAPILLARY
Glucose-Capillary: 107 mg/dL — ABNORMAL HIGH (ref 70–99)
Glucose-Capillary: 114 mg/dL — ABNORMAL HIGH (ref 70–99)

## 2011-01-02 NOTE — Op Note (Signed)
[name], [name]               ACCOUNT NO.:  [id number]  MEDICAL RECORD NO.:  [id number]  LOCATION:  DAYL                         FACILITY:  [facility]  PHYSICIAN:  Excell Seltzer. Annabell Howells, M.D.    DATE OF BIRTH:  07-12-1940  DATE OF PROCEDURE:  01/01/2011 DATE OF DISCHARGE:                              OPERATIVE REPORT   PATIENT OF:  Excell Seltzer. Annabell Howells, M.D.  PROCEDURE:  Retrograde urethrogram with interpretation, urethral balloon dilation and cystoscopy.  PREOPERATIVE DIAGNOSIS:  Balanitis xerotica obliterans with urethral stricture.  POSTOPERATIVE DIAGNOSIS:  Balanitis xerotica obliterans with urethral stricture.  SURGEON:  Excell Seltzer. Annabell Howells, M.D.  ANESTHESIA:  General.  SPECIMEN:  None.  DRAIN:  None.  COMPLICATIONS:  None.  INDICATIONS:  [name] is a 70 year old white male who initially presented to the office with difficulty urinating and he was found to have a pinpoint meatal stenosis and evidence of balanitis xerotica obliterans.  It was felt that formal urethral dilation in the OR with inspection of the remaining urethra was indicated.  FINDINGS OF PROCEDURE:  He was given Cipro.  He was taken to the operating room where general anesthetic was induced.  He was placed in lithotomy position.  His perineum and genitalia were prepped with Betadine solution as was his abdomen, should suprapubic tube be needed. He was then draped in usual sterile fashion and time-out was performed.  Inspection of the penis revealed prior circumcision with minimal penile skin and dense adhesions to the glans with balanitis xerotica obliterans.  The urethral meatus was identified at the junction of the prepuce and the glans ventrally.  A pediatric sound 10-French in size was passed with minimal resistance.  At this point, an 8-French cone-tip catheter was inserted in the urethra and contrast was instilled.  This revealed what appeared to be approximately a 2-cm stricture of the distal ureter  with an otherwise fairly normal caliber proximal urethra. Contrast did not efflux into the bladder.  At this point, a 6-French rigid ureteroscope was passed through the urethral meatus.  Inspection revealed mild panurethral stricture disease with only significant narrowing in the distal urethra.  The prostatic urethra was approximately 2 cm in length with bilobar hyperplasia with obstruction.  I was not initially able to get into the bladder.  A guidewire was then passed through the ureteroscope into the bladder. This was confirmed fluoroscopically.  A 24-French 15-cm balloon was placed over the wire across the distal stricture and the balloon was dilated to 18 atmospheres and this was left inflated for 3 minutes.  The balloon was then deflated and removed. The cystoscopy was then performed using the 22-French scope and 12- and 70-degree lenses.  Urethra and prostatic inspection were noted above. Examination of bladder revealed mild trabeculation.  No tumors or stones were noted.  Ureteral orifices were unremarkable.  At this point, the bladder was drained.  The cystoscope and wire were removed.  The patient was taken down from lithotomy position.  His anesthetic was reversed.  He was moved to the recovery room in stable condition.  He will be discharged home with followup on January 08, 2011, and we will provide him with a meatal dilator  to try to maintain his meatal caliber.     Excell Seltzer. Annabell Howells, M.D.     JJW/MEDQ  D:  01/01/2011  T:  01/01/2011  Job:  [id number]  Electronically Signed by Bjorn Pippin M.D. on 01/02/2011 02:25:51 PM

## 2011-01-21 ENCOUNTER — Other Ambulatory Visit: Payer: Self-pay | Admitting: Oncology

## 2011-01-21 ENCOUNTER — Encounter (HOSPITAL_BASED_OUTPATIENT_CLINIC_OR_DEPARTMENT_OTHER): Payer: Medicare Other | Admitting: Oncology

## 2011-01-21 DIAGNOSIS — C8590 Non-Hodgkin lymphoma, unspecified, unspecified site: Secondary | ICD-10-CM

## 2011-01-21 DIAGNOSIS — C8291 Follicular lymphoma, unspecified, lymph nodes of head, face, and neck: Secondary | ICD-10-CM

## 2011-01-21 DIAGNOSIS — Z452 Encounter for adjustment and management of vascular access device: Secondary | ICD-10-CM

## 2011-01-21 LAB — CBC WITH DIFFERENTIAL/PLATELET
BASO%: 0.1 % (ref 0.0–2.0)
Basophils Absolute: 0 10*3/uL (ref 0.0–0.1)
EOS%: 3.5 % (ref 0.0–7.0)
HGB: 15.3 g/dL (ref 13.0–17.1)
MCH: 31 pg (ref 27.2–33.4)
MCHC: 34.6 g/dL (ref 32.0–36.0)
MCV: 89.6 fL (ref 79.3–98.0)
MONO%: 8.9 % (ref 0.0–14.0)
RBC: 4.95 10*6/uL (ref 4.20–5.82)
RDW: 12.9 % (ref 11.0–14.6)
lymph#: 1 10*3/uL (ref 0.9–3.3)

## 2011-01-21 LAB — COMPREHENSIVE METABOLIC PANEL
CO2: 24 mEq/L (ref 19–32)
Calcium: 8.6 mg/dL (ref 8.4–10.5)
Creatinine, Ser: 0.7 mg/dL (ref 0.50–1.35)
Glucose, Bld: 321 mg/dL — ABNORMAL HIGH (ref 70–99)
Total Bilirubin: 0.3 mg/dL (ref 0.3–1.2)
Total Protein: 5.9 g/dL — ABNORMAL LOW (ref 6.0–8.3)

## 2011-01-21 LAB — LACTATE DEHYDROGENASE: LDH: 135 U/L (ref 94–250)

## 2011-03-03 ENCOUNTER — Ambulatory Visit (HOSPITAL_COMMUNITY)
Admission: RE | Admit: 2011-03-03 | Discharge: 2011-03-03 | Disposition: A | Discharge status: Home / Self Care | Payer: Medicare Other | Source: Ambulatory Visit | Attending: Oncology | Admitting: Oncology

## 2011-03-03 DIAGNOSIS — C8590 Non-Hodgkin lymphoma, unspecified, unspecified site: Secondary | ICD-10-CM

## 2011-03-03 DIAGNOSIS — N289 Disorder of kidney and ureter, unspecified: Secondary | ICD-10-CM | POA: Insufficient documentation

## 2011-03-03 DIAGNOSIS — C8589 Other specified types of non-Hodgkin lymphoma, extranodal and solid organ sites: Secondary | ICD-10-CM | POA: Insufficient documentation

## 2011-03-03 DIAGNOSIS — D739 Disease of spleen, unspecified: Secondary | ICD-10-CM | POA: Insufficient documentation

## 2011-03-03 MED ORDER — IOHEXOL 300 MG/ML  SOLN
125.0000 mL | Freq: Once | INTRAMUSCULAR | Status: AC | PRN
  Administered 2011-03-03: 125 mL via INTRAVENOUS

## 2011-03-18 ENCOUNTER — Encounter (HOSPITAL_BASED_OUTPATIENT_CLINIC_OR_DEPARTMENT_OTHER): Payer: Medicare Other | Admitting: Oncology

## 2011-03-18 DIAGNOSIS — C8291 Follicular lymphoma, unspecified, lymph nodes of head, face, and neck: Secondary | ICD-10-CM

## 2011-03-18 DIAGNOSIS — Z452 Encounter for adjustment and management of vascular access device: Secondary | ICD-10-CM

## 2011-05-12 ENCOUNTER — Encounter: Payer: Self-pay | Admitting: *Deleted

## 2011-05-13 ENCOUNTER — Ambulatory Visit (HOSPITAL_BASED_OUTPATIENT_CLINIC_OR_DEPARTMENT_OTHER): Payer: Medicare Other

## 2011-05-13 DIAGNOSIS — C8291 Follicular lymphoma, unspecified, lymph nodes of head, face, and neck: Secondary | ICD-10-CM

## 2011-05-13 DIAGNOSIS — Z452 Encounter for adjustment and management of vascular access device: Secondary | ICD-10-CM

## 2011-05-13 MED ORDER — HEPARIN SOD (PORK) LOCK FLUSH 100 UNIT/ML IV SOLN
500.0000 [IU] | Freq: Once | INTRAVENOUS | Status: AC
  Administered 2011-05-13: 500 [IU] via INTRAVENOUS
  Filled 2011-05-13: qty 5

## 2011-05-13 MED ORDER — SODIUM CHLORIDE 0.9 % IJ SOLN
10.0000 mL | Freq: Once | INTRAMUSCULAR | Status: AC
  Administered 2011-05-13: 10 mL via INTRAVENOUS
  Filled 2011-05-13: qty 10

## 2011-05-27 ENCOUNTER — Telehealth: Payer: Self-pay | Admitting: Oncology

## 2011-05-27 NOTE — Telephone Encounter (Signed)
gve the pt his dec 2012 appt calendar °

## 2011-05-29 ENCOUNTER — Encounter: Payer: Self-pay | Admitting: *Deleted

## 2011-06-01 ENCOUNTER — Encounter: Payer: Self-pay | Admitting: Oncology

## 2011-06-01 DIAGNOSIS — I4891 Unspecified atrial fibrillation: Secondary | ICD-10-CM | POA: Insufficient documentation

## 2011-06-10 ENCOUNTER — Ambulatory Visit (HOSPITAL_BASED_OUTPATIENT_CLINIC_OR_DEPARTMENT_OTHER): Payer: Medicare Other | Admitting: Oncology

## 2011-06-10 ENCOUNTER — Other Ambulatory Visit: Payer: Self-pay | Admitting: Oncology

## 2011-06-10 ENCOUNTER — Telehealth: Payer: Self-pay | Admitting: Oncology

## 2011-06-10 ENCOUNTER — Other Ambulatory Visit (HOSPITAL_BASED_OUTPATIENT_CLINIC_OR_DEPARTMENT_OTHER): Payer: Medicare Other | Admitting: Lab

## 2011-06-10 DIAGNOSIS — C8291 Follicular lymphoma, unspecified, lymph nodes of head, face, and neck: Secondary | ICD-10-CM

## 2011-06-10 DIAGNOSIS — I2510 Atherosclerotic heart disease of native coronary artery without angina pectoris: Secondary | ICD-10-CM

## 2011-06-10 DIAGNOSIS — C8589 Other specified types of non-Hodgkin lymphoma, extranodal and solid organ sites: Secondary | ICD-10-CM

## 2011-06-10 DIAGNOSIS — C8330 Diffuse large B-cell lymphoma, unspecified site: Secondary | ICD-10-CM

## 2011-06-10 DIAGNOSIS — I4891 Unspecified atrial fibrillation: Secondary | ICD-10-CM

## 2011-06-10 DIAGNOSIS — E119 Type 2 diabetes mellitus without complications: Secondary | ICD-10-CM

## 2011-06-10 DIAGNOSIS — Z452 Encounter for adjustment and management of vascular access device: Secondary | ICD-10-CM

## 2011-06-10 LAB — CBC WITH DIFFERENTIAL/PLATELET
Basophils Absolute: 0 10*3/uL (ref 0.0–0.1)
EOS%: 3.4 % (ref 0.0–7.0)
Eosinophils Absolute: 0.3 10*3/uL (ref 0.0–0.5)
HGB: 15.7 g/dL (ref 13.0–17.1)
LYMPH%: 10.5 % — ABNORMAL LOW (ref 14.0–49.0)
MCH: 31.1 pg (ref 27.2–33.4)
MCV: 91.4 fL (ref 79.3–98.0)
MONO%: 8.9 % (ref 0.0–14.0)
Platelets: 127 10*3/uL — ABNORMAL LOW (ref 140–400)
RBC: 5.05 10*6/uL (ref 4.20–5.82)
RDW: 12.8 % (ref 11.0–14.6)

## 2011-06-10 LAB — COMPREHENSIVE METABOLIC PANEL
AST: 19 U/L (ref 0–37)
Albumin: 3.5 g/dL (ref 3.5–5.2)
Alkaline Phosphatase: 67 U/L (ref 39–117)
BUN: 14 mg/dL (ref 6–23)
Creatinine, Ser: 0.65 mg/dL (ref 0.50–1.35)
Glucose, Bld: 299 mg/dL — ABNORMAL HIGH (ref 70–99)
Potassium: 4.6 mEq/L (ref 3.5–5.3)
Total Bilirubin: 0.3 mg/dL (ref 0.3–1.2)

## 2011-06-10 NOTE — Telephone Encounter (Signed)
gve the pt his April 2013 appt calendar along with the ct scan appt with instructions. Pt is aware he will be notfied with the appt for the port removal

## 2011-06-11 NOTE — Progress Notes (Signed)
[facility] OFFICE PROGRESS NOTE  Cc:  No primary provider on file.  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  PAST THERPAY:  R-CHOP x 3 cycles between 12/27/09 and 02/07/10.  He also underwent consolidative XRT.  CURRENT THERAPY:  watchful observation.  INTERVAL HISTORY: [name] 70 y.o. male returns to clinic today with his wife.  He reports intermittent discomfort in the left cervical neck.  He denies palpable adenopathy.  He has stable performance status and stamina; he is independent of all activities of daily living.  He denies drenching night sweat, anorexia, weight loss, DOE, worsening pedal edema compared to his baseline.   Patient denies fatigue, headache, visual changes, confusion, mucositis, odynophagia, dysphagia, nausea vomiting, jaundice, chest pain, palpitation, shortness of breath, dyspnea on exertion, productive cough, gum bleeding, epistaxis, hematemesis, hemoptysis, abdominal pain, abdominal swelling, early satiety, melena, hematochezia, hematuria, skin rash, spontaneous bleeding, joint swelling, joint pain, heat or cold intolerance, bowel bladder incontinence, back pain, focal motor weakness, paresthesia, depression, suicidal or homocidal ideation, feeling hopelessness.   MEDICAL HISTORY: Past Medical History  Diagnosis Date  . 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 Hypertension   . Diabetes mellitus   . A-fib   . Cataract   . Charcot-Marie-Tooth disease     SURGICAL HISTORY: No past surgical history on file.  MEDICATIONS: Current Outpatient Prescriptions  Medication Sig Dispense Refill  . aspirin 325 MG tablet Take 325 mg by mouth 3 (three) times a week.       . clopidogrel (PLAVIX) 75 MG tablet Take 75 mg by mouth daily.        . insulin glargine (LANTUS) 100 UNIT/ML injection Inject 42 Units into the skin at bedtime.         [name] lisinopril (PRINIVIL,ZESTRIL) 20 MG tablet Take 20 mg by mouth daily.        . metoprolol tartrate (LOPRESSOR) 25 MG tablet Take 25 mg by mouth 2 (two) times daily.       . Multiple Vitamins-Minerals (SENIOR MULTIVITAMIN PLUS PO) Take 1 tablet by mouth daily.        . simvastatin (ZOCOR) 5 MG tablet Take 5 mg by mouth at bedtime.          ALLERGIES:  is allergic to actos and penicillins.  REVIEW OF SYSTEMS:  The rest of the 14-point review of system was negative.   Filed Vitals:   06/10/11 1343  BP: 168/74  Pulse: 68  Temp: 97.1 F (36.2 C)   Wt Readings from Last 3 Encounters:  06/10/11 216 lb 4.8 oz (98.113 kg)  01/21/11 212 lb 14.4 oz (96.571 kg)   ECOG Performance status: 1  PHYSICAL EXAMINATION:   General:  Mildly obese male in no acute distress.  Eyes:  no scleral icterus.  ENT:  There were no oropharyngeal lesions.  Neck was without thyromegaly.  Lymphatics:  Negative cervical, supraclavicular or axillary adenopathy.  Respiratory: lungs were clear bilaterally without wheezing or crackles.  Cardiovascular:  Regular rate and rhythm, S1/S2, without murmur, rub or gallop.  There was no pedal edema.  GI:  abdomen was soft, flat, nontender, nondistended, without organomegaly.  Muscoloskeletal:  no spinal tenderness of palpation of vertebral spine.  Skin exam was without echymosis, petichae.  Neuro exam was nonfocal.  Patient was able to get on and  off exam table without assistance.  Gait was normal.  Patient was alerted and oriented.  Attention was good.   Language was appropriate.  Mood was normal without depression.  Speech was not pressured.  Thought content was not tangential.     LABORATORY/RADIOLOGY DATA:  Lab Results  Component Value Date   WBC 9.9 06/10/2011   HGB 15.7 06/10/2011   HCT 46.2 06/10/2011   PLT 127* 06/10/2011   GLUCOSE 299* 06/10/2011   CHOL  Value: 132        ATP III CLASSIFICATION:  <200     mg/dL   Desirable  161-096  mg/dL   Borderline High   >=045    mg/dL   High        09/28/8117   TRIG 64 12/03/2008   HDL 19* 12/03/2008   LDLCALC  Value: 100        Total Cholesterol/HDL:CHD Risk Coronary Heart Disease Risk Table                     Men   Women  1/2 Average Risk   3.4   3.3  Average Risk       5.0   4.4  2 X Average Risk   9.6   7.1  3 X Average Risk  23.4   11.0        Use the calculated Patient Ratio above and the CHD Risk Table to determine the patient's CHD Risk.        ATP III CLASSIFICATION (LDL):  <100     mg/dL   Optimal  147-829  mg/dL   Near or Above                    Optimal  130-159  mg/dL   Borderline  562-130  mg/dL   High  >865     mg/dL   Very High* 7/84/6962   ALT 22 06/10/2011   AST 19 06/10/2011   NA 140 06/10/2011   K 4.6 06/10/2011   CL 103 06/10/2011   CREATININE 0.65 06/10/2011   BUN 14 06/10/2011   CO2 27 06/10/2011   INR 1.10 12/12/2009   HGBA1C  Value: 9.0 (NOTE) The ADA recommends the following therapeutic goal for glycemic control related to Hgb A1c measurement: Goal of therapy: <6.5 Hgb A1c  Reference: American Diabetes Association: Clinical Practice Recommendations 2010, Diabetes Care, 2010, 33: (Suppl  1).* 12/02/2008    ASSESSMENT AND PLAN:   1. History of diffuse large B-cell lymphoma:  I discussed with Ms. Dorvil and his wife again that there is no evidence of recurrent disease by clinical history, physical examination, or laboratory tests today.  His surveillance CT in 02/2011 was negative.  I will see him in about 4 months with clinical exam only.  The intermittent left neck discomfort is most likely due to history of treatment.  I have low clinical concern for recurrence.   2. Mild leukocytosis.  Resolved.  3. History of coronary artery disease.  He is on aspirin, Plavix, lisinopril, metoprolol, and simvastatin.  4. Diabetes mellitus, type II.  He is on insulin glargine per PCP.  His Glc today (albeit nonfasting) was elevated.  I advised him to f/u with his PCP for further titration of  insulin. 5. Hyperlipidemia.  He is on simvastatin per PCP.   6. Hypertension.  Slightly elevated systolic blood pressure.  He is on lisinopril and metoprolol per PCP.

## 2011-06-18 ENCOUNTER — Encounter (HOSPITAL_COMMUNITY): Payer: Self-pay | Admitting: Pharmacy Technician

## 2011-06-22 ENCOUNTER — Other Ambulatory Visit: Payer: Self-pay | Admitting: Radiology

## 2011-06-24 ENCOUNTER — Ambulatory Visit (HOSPITAL_COMMUNITY)
Admission: RE | Admit: 2011-06-24 | Discharge: 2011-06-24 | Disposition: A | Discharge status: Home / Self Care | Payer: Medicare Other | Source: Ambulatory Visit | Attending: Oncology | Admitting: Oncology

## 2011-06-24 ENCOUNTER — Inpatient Hospital Stay (HOSPITAL_COMMUNITY)
Admission: RE | Admit: 2011-06-24 | Discharge: 2011-06-24 | Payer: Medicare Other | Source: Ambulatory Visit | Attending: Oncology | Admitting: Oncology

## 2011-06-24 DIAGNOSIS — Z7982 Long term (current) use of aspirin: Secondary | ICD-10-CM | POA: Insufficient documentation

## 2011-06-24 DIAGNOSIS — I4891 Unspecified atrial fibrillation: Secondary | ICD-10-CM | POA: Insufficient documentation

## 2011-06-24 DIAGNOSIS — Z7902 Long term (current) use of antithrombotics/antiplatelets: Secondary | ICD-10-CM | POA: Insufficient documentation

## 2011-06-24 DIAGNOSIS — C8330 Diffuse large B-cell lymphoma, unspecified site: Secondary | ICD-10-CM

## 2011-06-24 DIAGNOSIS — Z79899 Other long term (current) drug therapy: Secondary | ICD-10-CM | POA: Insufficient documentation

## 2011-06-24 DIAGNOSIS — Z452 Encounter for adjustment and management of vascular access device: Secondary | ICD-10-CM | POA: Insufficient documentation

## 2011-06-24 DIAGNOSIS — E119 Type 2 diabetes mellitus without complications: Secondary | ICD-10-CM | POA: Insufficient documentation

## 2011-06-24 DIAGNOSIS — C8589 Other specified types of non-Hodgkin lymphoma, extranodal and solid organ sites: Secondary | ICD-10-CM | POA: Insufficient documentation

## 2011-06-24 DIAGNOSIS — Z794 Long term (current) use of insulin: Secondary | ICD-10-CM | POA: Insufficient documentation

## 2011-06-24 DIAGNOSIS — I10 Essential (primary) hypertension: Secondary | ICD-10-CM | POA: Insufficient documentation

## 2011-06-24 MED ORDER — SODIUM CHLORIDE 0.9 % IV SOLN: Freq: Once | INTRAVENOUS | Status: DC

## 2011-06-24 MED ORDER — VANCOMYCIN HCL IN DEXTROSE 1-5 GM/200ML-% IV SOLN
1000.0000 mg | INTRAVENOUS | Status: DC
  Filled 2011-06-24: qty 200

## 2011-06-24 NOTE — H&P (Signed)
APOLONIO CUTTING is an 71 y.o. male.   Chief Complaint: Here for Port a cath removal  HPI: Pleasant 71 yo male with a history of B cell lymphoma here for a portacath removal. The patient has been on Plavix which has been on hold since Friday.  Past Medical History  Diagnosis Date  . 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 Hypertension   . Diabetes mellitus   . A-fib   . Cataract   . Charcot-Marie-Tooth disease     No past surgical history on file.  No family history on file. Social History:  does not have a smoking history on file. He does not have any smokeless tobacco history on file. His alcohol and drug histories not on file.  Allergies:  Allergies  Allergen Reactions  . Actos (Pioglitazone Hydrochloride) Other (See Comments)    Afib  . Hydrochlorothiazide Other (See Comments)    Lower potassium to low  . Penicillins Rash    Medications Prior to Admission  Medication Sig Dispense Refill  . aspirin 325 MG tablet Take 325 mg by mouth 3 (three) times a week. Monday, Wednesday, and Friday.      . clopidogrel (PLAVIX) 75 MG tablet Take 75 mg by mouth daily after breakfast.       . insulin glargine (LANTUS) 100 UNIT/ML injection Inject 42 Units into the skin every evening.       [name] lisinopril (PRINIVIL,ZESTRIL) 20 MG tablet Take 20 mg by mouth every morning.       . metoprolol tartrate (LOPRESSOR) 25 MG tablet Take 25 mg by mouth 2 (two) times daily.       . Multiple Vitamins-Minerals (SENIOR MULTIVITAMIN PLUS PO) Take 0.5 tablets by mouth daily.       . simvastatin (ZOCOR) 20 MG tablet Take 20 mg by mouth at bedtime.         Medications Prior to Admission  Medication Dose Route Frequency Provider Last Rate Last Dose  . 0.9 %  sodium chloride infusion   Intravenous Once Robet Leu, PA      . vancomycin (VANCOCIN) IVPB 1000 mg/200 mL premix  1,000 mg Intravenous to XRAY Robet Leu, PA        No results found for this or any  previous visit (from the past 48 hour(s)). No results found.  Review of Systems  Constitutional: Negative for fever.  Respiratory: Negative for cough, shortness of breath and wheezing.   Cardiovascular: Negative for chest pain.  Endo/Heme/Allergies: Does not bruise/bleed easily.    Blood pressure 126/59, pulse 62, temperature 97.8 F (36.6 C), resp. rate 21, SpO2 98.00%. Physical Exam  Heent - unremarkable - airway 1 Heart - RRR Lungs - Clear Abd - soft NT  Assessment/Plan Portacath removal today Dr Bonnielee Haff. Informed consent obtained.  Daulton Harbaugh 06/24/2011, 1:02 PM

## 2011-10-08 ENCOUNTER — Ambulatory Visit (HOSPITAL_COMMUNITY)
Admission: RE | Admit: 2011-10-08 | Discharge: 2011-10-08 | Disposition: A | Discharge status: Home / Self Care | Payer: Medicare Other | Source: Ambulatory Visit | Attending: Oncology | Admitting: Oncology

## 2011-10-08 ENCOUNTER — Other Ambulatory Visit (HOSPITAL_BASED_OUTPATIENT_CLINIC_OR_DEPARTMENT_OTHER): Payer: Medicare Other

## 2011-10-08 DIAGNOSIS — D7389 Other diseases of spleen: Secondary | ICD-10-CM | POA: Insufficient documentation

## 2011-10-08 DIAGNOSIS — N289 Disorder of kidney and ureter, unspecified: Secondary | ICD-10-CM | POA: Insufficient documentation

## 2011-10-08 DIAGNOSIS — I4891 Unspecified atrial fibrillation: Secondary | ICD-10-CM

## 2011-10-08 DIAGNOSIS — C8330 Diffuse large B-cell lymphoma, unspecified site: Secondary | ICD-10-CM

## 2011-10-08 DIAGNOSIS — N400 Enlarged prostate without lower urinary tract symptoms: Secondary | ICD-10-CM | POA: Insufficient documentation

## 2011-10-08 DIAGNOSIS — I2510 Atherosclerotic heart disease of native coronary artery without angina pectoris: Secondary | ICD-10-CM | POA: Insufficient documentation

## 2011-10-08 DIAGNOSIS — C8589 Other specified types of non-Hodgkin lymphoma, extranodal and solid organ sites: Secondary | ICD-10-CM

## 2011-10-08 DIAGNOSIS — I700 Atherosclerosis of aorta: Secondary | ICD-10-CM | POA: Insufficient documentation

## 2011-10-08 LAB — CMP (CANCER CENTER ONLY)
ALT(SGPT): 30 U/L (ref 10–47)
Albumin: 3.2 g/dL — ABNORMAL LOW (ref 3.3–5.5)
CO2: 27 mEq/L (ref 18–33)
Calcium: 8.4 mg/dL (ref 8.0–10.3)
Chloride: 103 mEq/L (ref 98–108)
Glucose, Bld: 96 mg/dL (ref 73–118)
Potassium: 4.4 mEq/L (ref 3.3–4.7)
Sodium: 141 mEq/L (ref 128–145)
Total Protein: 6.8 g/dL (ref 6.4–8.1)

## 2011-10-08 LAB — LACTATE DEHYDROGENASE: LDH: 138 U/L (ref 94–250)

## 2011-10-08 MED ORDER — IOHEXOL 300 MG/ML  SOLN
125.0000 mL | Freq: Once | INTRAMUSCULAR | Status: AC | PRN
  Administered 2011-10-08: 125 mL via INTRAVENOUS

## 2011-10-09 ENCOUNTER — Telehealth: Payer: Self-pay | Admitting: Oncology

## 2011-10-09 ENCOUNTER — Ambulatory Visit (HOSPITAL_BASED_OUTPATIENT_CLINIC_OR_DEPARTMENT_OTHER): Payer: Medicare Other | Admitting: Oncology

## 2011-10-09 ENCOUNTER — Ambulatory Visit (HOSPITAL_BASED_OUTPATIENT_CLINIC_OR_DEPARTMENT_OTHER): Payer: Medicare Other | Admitting: Lab

## 2011-10-09 ENCOUNTER — Encounter: Payer: Self-pay | Admitting: Oncology

## 2011-10-09 VITALS — BP 152/71 | HR 64 | Temp 97.3°F | Ht 71.0 in | Wt 213.8 lb

## 2011-10-09 DIAGNOSIS — I10 Essential (primary) hypertension: Secondary | ICD-10-CM | POA: Insufficient documentation

## 2011-10-09 DIAGNOSIS — C8581 Other specified types of non-Hodgkin lymphoma, lymph nodes of head, face, and neck: Secondary | ICD-10-CM

## 2011-10-09 DIAGNOSIS — C8510 Unspecified B-cell lymphoma, unspecified site: Secondary | ICD-10-CM

## 2011-10-09 DIAGNOSIS — E119 Type 2 diabetes mellitus without complications: Secondary | ICD-10-CM | POA: Insufficient documentation

## 2011-10-09 HISTORY — DX: Unspecified B-cell lymphoma, unspecified site: C85.10

## 2011-10-09 LAB — CBC WITH DIFFERENTIAL/PLATELET
BASO%: 0.2 % (ref 0.0–2.0)
EOS%: 4.1 % (ref 0.0–7.0)
Eosinophils Absolute: 0.4 10*3/uL (ref 0.0–0.5)
LYMPH%: 10.3 % — ABNORMAL LOW (ref 14.0–49.0)
MCHC: 35.6 g/dL (ref 32.0–36.0)
MCV: 85.6 fL (ref 79.3–98.0)
MONO%: 13.3 % (ref 0.0–14.0)
NEUT#: 7.3 10*3/uL — ABNORMAL HIGH (ref 1.5–6.5)
Platelets: 141 10*3/uL (ref 140–400)
RBC: 5.22 10*6/uL (ref 4.20–5.82)
RDW: 12.8 % (ref 11.0–14.6)
nRBC: 0 % (ref 0–0)

## 2011-10-09 NOTE — Telephone Encounter (Signed)
Gave pt appt calendar  for October 2013 lab and ML °

## 2011-10-09 NOTE — Progress Notes (Signed)
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  PAST THERPAY:  R-CHOP x 3 cycles between 12/27/09 and 02/07/10.  He also underwent consolidative XRT.  CURRENT THERAPY:  watchful observation.  INTERVAL HISTORY: [name] 71 y.o. male returns to clinic today by himself.  He denies palpable adenopathy.  He has stable performance status and stamina; he is independent of all activities of daily living.  He denies drenching night sweat, anorexia, weight loss, DOE, worsening pedal edema compared to his baseline.   Patient denies fatigue, headache, visual changes, confusion, mucositis, odynophagia, dysphagia, nausea vomiting, jaundice, chest pain, palpitation, shortness of breath, dyspnea on exertion, productive cough, gum bleeding, epistaxis, hematemesis, hemoptysis, abdominal pain, abdominal swelling, early satiety, melena, hematochezia, hematuria, skin rash, spontaneous bleeding, joint swelling, joint pain, heat or cold intolerance, bowel bladder incontinence, back pain, focal motor weakness, paresthesia, depression, suicidal or homocidal ideation, feeling hopelessness.   MEDICAL HISTORY: Past Medical History  Diagnosis Date  . 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 Hypertension   . Diabetes mellitus   . A-fib   . Cataract   . Charcot-Marie-Tooth disease   . B-cell lymphoma 10/09/2011    SURGICAL HISTORY: History reviewed. No pertinent past surgical history.  MEDICATIONS: Current Outpatient Prescriptions  Medication Sig Dispense Refill  . aspirin 325 MG tablet Take 325 mg by mouth 3 (three) times a week. Monday, Wednesday, and Friday.      . clopidogrel (PLAVIX) 75 MG tablet Take 75 mg by mouth daily after breakfast.       . insulin glargine (LANTUS) 100 UNIT/ML injection Inject 42  Units into the skin every evening.       [name] lisinopril (PRINIVIL,ZESTRIL) 20 MG tablet Take 20 mg by mouth every morning.       . metoprolol tartrate (LOPRESSOR) 25 MG tablet Take 25 mg by mouth 2 (two) times daily.       . Multiple Vitamins-Minerals (SENIOR MULTIVITAMIN PLUS PO) Take 0.5 tablets by mouth daily.       . simvastatin (ZOCOR) 20 MG tablet Take 20 mg by mouth at bedtime.          ALLERGIES:  is allergic to actos; hydrochlorothiazide; and penicillins.  REVIEW OF SYSTEMS:  The rest of the 14-point review of system was negative.   Filed Vitals:   10/09/11 1328  BP: 152/71  Pulse: 64  Temp: 97.3 F (36.3 C)   Wt Readings from Last 3 Encounters:  10/09/11 213 lb 12.8 oz (96.979 kg)  06/10/11 216 lb 4.8 oz (98.113 kg)  01/21/11 212 lb 14.4 oz (96.571 kg)   ECOG Performance status: 1  PHYSICAL EXAMINATION:   General:  Mildly obese male in no acute distress.  Eyes:  no scleral icterus.  ENT:  There were no oropharyngeal lesions.  Neck was without thyromegaly.  Lymphatics:  Negative cervical, supraclavicular or axillary adenopathy.  Respiratory: lungs were clear bilaterally without wheezing or crackles.  Cardiovascular:  Regular rate and rhythm, S1/S2, without murmur, rub or gallop.  There was no pedal edema.  GI:  abdomen was soft, flat, nontender, nondistended, without organomegaly.  Muscoloskeletal:  no spinal tenderness of palpation of vertebral spine.  Skin exam was without echymosis, petichae.  Neuro exam was nonfocal.  Patient was  able to get on and off exam table without assistance.  Gait was normal.  Patient was alerted and oriented.  Attention was good.   Language was appropriate.  Mood was normal without depression.  Speech was not pressured.  Thought content was not tangential.     LABORATORY/RADIOLOGY DATA:  Lab Results  Component Value Date   WBC 10.1 10/09/2011   HGB 15.9 10/09/2011   HCT 44.7 10/09/2011   PLT 141 10/09/2011   GLUCOSE 96 10/08/2011   CHOL  Value:  132        ATP III CLASSIFICATION:  <200     mg/dL   Desirable  161-096  mg/dL   Borderline High  >=045    mg/dL   High        09/28/8117   TRIG 64 12/03/2008   HDL 19* 12/03/2008   LDLCALC  Value: 100        Total Cholesterol/HDL:CHD Risk Coronary Heart Disease Risk Table                     Men   Women  1/2 Average Risk   3.4   3.3  Average Risk       5.0   4.4  2 X Average Risk   9.6   7.1  3 X Average Risk  23.4   11.0        Use the calculated Patient Ratio above and the CHD Risk Table to determine the patient's CHD Risk.        ATP III CLASSIFICATION (LDL):  <100     mg/dL   Optimal  147-829  mg/dL   Near or Above                    Optimal  130-159  mg/dL   Borderline  562-130  mg/dL   High  >865     mg/dL   Very High* 7/84/6962   ALT 22 06/10/2011   AST 21 10/08/2011   NA 141 10/08/2011   K 4.4 10/08/2011   CL 103 10/08/2011   CREATININE 0.9 10/08/2011   BUN 10 10/08/2011   CO2 27 10/08/2011   INR 1.10 12/12/2009   HGBA1C  Value: 9.0 (NOTE) The ADA recommends the following therapeutic goal for glycemic control related to Hgb A1c measurement: Goal of therapy: <6.5 Hgb A1c  Reference: American Diabetes Association: Clinical Practice Recommendations 2010, Diabetes Care, 2010, 33: (Suppl  1).* 12/02/2008   RADIOLOGY:  *RADIOLOGY REPORT*  Clinical Data: B-CELL LYMPHOMA.  CT NECK, CHEST, ABDOMEN AND PELVIS WITH CONTRAST  Technique: Multidetector CT imaging of the neck, chest, abdomen  and pelvis was performed using the standard protocol following the  bolus administration of intravenous contrast.  Contrast: OMNIPAQUE IOHEXOL 300 MG/ML SOLN  Comparison: 03/03/2011  CT NECK  Findings: No enlarged cervical lymph nodes. Small submandibular  nodes adjacent to the left subdural gland are stable. Airway is  patent. Visualized paranasal sinuses and mastoids are clear.  Thyroid unremarkable. Parotid and submandibular glands  unremarkable.  Degenerative changes in the cervical spine. No acute bony    abnormality.  Calcifications in the carotid bulbs bilaterally, right greater than  left. No hemodynamically significant stenosis suspected.  Interval removal of right sided Port-A-Cath.  IMPRESSION:  No evidence of cervical adenopathy or acute findings.  CT CHEST  Findings: No mediastinal, hilar, or axillary adenopathy. Heart is  normal size. Aorta is normal caliber. Coronary artery  calcifications again noted,  stable. Linear densities in the  lingula and anterior right middle lobe are stable, compatible with  scarring. Tiny nodules along the left fissure are unchanged. No  new or enlarging the nodules. No pleural effusions.  No acute or focal bony abnormality.  IMPRESSION:  Coronary artery disease. No suspicious or enlarging pulmonary  nodules. No adenopathy.  CT ABDOMEN AND PELVIS  Findings: Liver, gallbladder, adrenals are unremarkable. Mild  fatty replacement of the pancreas. Multiple low-density areas and  calcifications noted throughout the spleen, stable.  Small low-density lesions are noted in the kidneys bilaterally,  stable, likely small cysts. No hydronephrosis.  Aorta and iliac vessels are heavily calcified, non-aneurysmal.  Prostate is mildly enlarged. Urinary bladder is unremarkable. No  free fluid, free air or adenopathy.  Appendix is visualized and is normal. Large and small bowel  grossly unremarkable.  No acute or focal bony abnormality. Degenerative changes in the  lumbar spine.  IMPRESSION:  No acute findings in the abdomen or pelvis.  Stable low density lesions and calcifications within the spleen.  Original Report Authenticated By: Cyndie Chime, M.D.  ASSESSMENT AND PLAN:   1. History of diffuse large B-cell lymphoma:  I discussed with Ms. Rewerts there is no evidence of recurrent disease by clinical history, physical examination, or laboratory tests today.  His surveillance CT in 09/2011 was negative. Patient will have surveillance annually at this  point in time.  2. History of coronary artery disease.  He is on aspirin, Plavix, lisinopril, metoprolol, and simvastatin.  3. Diabetes mellitus, type II.  He is on insulin glargine per PCP.  Glucose was 96 yesterday. He will continue to follow-up with PCP. 4. Hyperlipidemia.  He is on simvastatin per PCP.   5. Hypertension.  Slightly elevated systolic blood pressure.  He is on lisinopril and metoprolol per PCP. 6. Follow-up. The patient will be seen for labs and a clinical exam by me in 6 months with an annual CT scan with Dr Gaylyn Rong in 1 year.  The patient was seen and examined with Ha. >90% of plan of care developed by Dr Gaylyn Rong. The length of time of the face-to-face encounter was 30 minutes. More than 50% of time was spent counseling and coordination of care.

## 2012-04-08 ENCOUNTER — Other Ambulatory Visit (HOSPITAL_BASED_OUTPATIENT_CLINIC_OR_DEPARTMENT_OTHER): Payer: Medicare Other

## 2012-04-08 ENCOUNTER — Telehealth: Payer: Self-pay | Admitting: Oncology

## 2012-04-08 ENCOUNTER — Ambulatory Visit (HOSPITAL_BASED_OUTPATIENT_CLINIC_OR_DEPARTMENT_OTHER): Payer: Medicare Other | Admitting: Oncology

## 2012-04-08 VITALS — BP 156/68 | HR 62 | Temp 97.3°F | Resp 20 | Ht 71.0 in | Wt 216.7 lb

## 2012-04-08 DIAGNOSIS — C8510 Unspecified B-cell lymphoma, unspecified site: Secondary | ICD-10-CM

## 2012-04-08 DIAGNOSIS — C8581 Other specified types of non-Hodgkin lymphoma, lymph nodes of head, face, and neck: Secondary | ICD-10-CM

## 2012-04-08 DIAGNOSIS — I10 Essential (primary) hypertension: Secondary | ICD-10-CM

## 2012-04-08 DIAGNOSIS — E119 Type 2 diabetes mellitus without complications: Secondary | ICD-10-CM

## 2012-04-08 LAB — COMPREHENSIVE METABOLIC PANEL (CC13)
ALT: 20 U/L (ref 0–55)
AST: 15 U/L (ref 5–34)
CO2: 24 mEq/L (ref 22–29)
Calcium: 8.8 mg/dL (ref 8.4–10.4)
Chloride: 105 mEq/L (ref 98–107)
Creatinine: 0.7 mg/dL (ref 0.7–1.3)
Sodium: 138 mEq/L (ref 136–145)
Total Protein: 5.7 g/dL — ABNORMAL LOW (ref 6.4–8.3)

## 2012-04-08 LAB — CBC WITH DIFFERENTIAL/PLATELET
BASO%: 0.5 % (ref 0.0–2.0)
EOS%: 3.9 % (ref 0.0–7.0)
HCT: 47.5 % (ref 38.4–49.9)
MCH: 31.1 pg (ref 27.2–33.4)
MCHC: 34.3 g/dL (ref 32.0–36.0)
MONO#: 1.1 10*3/uL — ABNORMAL HIGH (ref 0.1–0.9)
NEUT%: 76.1 % — ABNORMAL HIGH (ref 39.0–75.0)
RBC: 5.24 10*6/uL (ref 4.20–5.82)
RDW: 12.9 % (ref 11.0–14.6)
WBC: 10.6 10*3/uL — ABNORMAL HIGH (ref 4.0–10.3)
lymph#: 1 10*3/uL (ref 0.9–3.3)

## 2012-04-08 LAB — LACTATE DEHYDROGENASE (CC13): LDH: 185 U/L (ref 125–220)

## 2012-04-08 NOTE — Telephone Encounter (Signed)
gv and printed appt schedule for April 2014 and gv to pt.

## 2012-04-08 NOTE — Progress Notes (Signed)
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  PAST THERPAY:  R-CHOP x 3 cycles between 12/27/09 and 02/07/10.  He also underwent consolidative XRT.  CURRENT THERAPY:  watchful observation.  INTERVAL HISTORY: [name] 71 y.o. male returns to clinic today by himself.  He denies palpable adenopathy.  He has stable performance status and stamina; he is independent of all activities of daily living.  He denies drenching night sweat, anorexia, weight loss, DOE, worsening pedal edema compared to his baseline. He reports several hypoglycemic episodes. He plans to follow-up with his PCP for this soon.  Patient denies fatigue, headache, visual changes, confusion, mucositis, odynophagia, dysphagia, nausea vomiting, jaundice, chest pain, palpitation, shortness of breath, dyspnea on exertion, productive cough, gum bleeding, epistaxis, hematemesis, hemoptysis, abdominal pain, abdominal swelling, early satiety, melena, hematochezia, hematuria, skin rash, spontaneous bleeding, joint swelling, joint pain, heat or cold intolerance, bowel bladder incontinence, back pain, focal motor weakness, paresthesia, depression, suicidal or homocidal ideation, feeling hopelessness.   MEDICAL HISTORY: Past Medical History  Diagnosis Date  . 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 Hypertension   . Diabetes mellitus   . A-fib   . Cataract   . Charcot-Marie-Tooth disease   . B-cell lymphoma 10/09/2011    SURGICAL HISTORY: No past surgical history on file.  MEDICATIONS: Current Outpatient Prescriptions  Medication Sig Dispense Refill  . aspirin 325 MG tablet Take 325 mg by mouth 3 (three) times a week. Monday, Wednesday, and Friday.      . clopidogrel (PLAVIX) 75 MG tablet Take 75 mg by mouth daily after breakfast.        . insulin glargine (LANTUS) 100 UNIT/ML injection Inject 42 Units into the skin every evening.       [name] lisinopril (PRINIVIL,ZESTRIL) 20 MG tablet Take 20 mg by mouth every morning.       . metoprolol tartrate (LOPRESSOR) 25 MG tablet Take 25 mg by mouth 2 (two) times daily.       . simvastatin (ZOCOR) 20 MG tablet Take 20 mg by mouth at bedtime.          ALLERGIES:  is allergic to actos; hydrochlorothiazide; and penicillins.  REVIEW OF SYSTEMS:  The rest of the 14-point review of system was negative.   Filed Vitals:   04/08/12 0950  BP: 156/68  Pulse: 62  Temp: 97.3 F (36.3 C)  Resp: 20   Wt Readings from Last 3 Encounters:  04/08/12 216 lb 11.2 oz (98.294 kg)  10/09/11 213 lb 12.8 oz (96.979 kg)  06/10/11 216 lb 4.8 oz (98.113 kg)   ECOG Performance status: 1  PHYSICAL EXAMINATION:   General:  Mildly obese male in no acute distress.  Eyes:  no scleral icterus.  ENT:  There were no oropharyngeal lesions.  Neck was without thyromegaly.  Lymphatics:  Negative cervical, supraclavicular or axillary adenopathy.  Respiratory: lungs were clear bilaterally without wheezing or crackles.  Cardiovascular:  Regular rate and rhythm, S1/S2, without murmur, rub or gallop.  There was no pedal edema.  GI:  abdomen was soft, flat, nontender, nondistended, without organomegaly.  Muscoloskeletal:  no spinal tenderness of palpation of vertebral spine.  Skin exam was without echymosis, petichae.  Neuro exam was nonfocal.  Patient was able to get on  and off exam table without assistance.  Gait was normal.  Patient was alerted and oriented.  Attention was good.   Language was appropriate.  Mood was normal without depression.  Speech was not pressured.  Thought content was not tangential.     LABORATORY/RADIOLOGY DATA:  Lab Results  Component Value Date   WBC 10.6* 04/08/2012   HGB 16.3 04/08/2012   HCT 47.5 04/08/2012   PLT 119* 04/08/2012   GLUCOSE 158* 04/08/2012   CHOL  Value: 132        ATP III  CLASSIFICATION:  <200     mg/dL   Desirable  161-096  mg/dL   Borderline High  >=045    mg/dL   High        09/28/8117   TRIG 64 12/03/2008   HDL 19* 12/03/2008   LDLCALC  Value: 100        Total Cholesterol/HDL:CHD Risk Coronary Heart Disease Risk Table                     Men   Women  1/2 Average Risk   3.4   3.3  Average Risk       5.0   4.4  2 X Average Risk   9.6   7.1  3 X Average Risk  23.4   11.0        Use the calculated Patient Ratio above and the CHD Risk Table to determine the patient's CHD Risk.        ATP III CLASSIFICATION (LDL):  <100     mg/dL   Optimal  147-829  mg/dL   Near or Above                    Optimal  130-159  mg/dL   Borderline  562-130  mg/dL   High  >865     mg/dL   Very High* 7/84/6962   ALT 20 04/08/2012   AST 15 04/08/2012   NA 138 04/08/2012   K 4.2 04/08/2012   CL 105 04/08/2012   CREATININE 0.7 04/08/2012   BUN 14.0 04/08/2012   CO2 24 04/08/2012   INR 1.10 12/12/2009   HGBA1C  Value: 9.0 (NOTE) The ADA recommends the following therapeutic goal for glycemic control related to Hgb A1c measurement: Goal of therapy: <6.5 Hgb A1c  Reference: American Diabetes Association: Clinical Practice Recommendations 2010, Diabetes Care, 2010, 33: (Suppl  1).* 12/02/2008    ASSESSMENT AND PLAN:   1. History of diffuse large B-cell lymphoma:  I discussed with Ms. Keegan there is no evidence of recurrent disease by clinical history, physical examination, or laboratory tests today.  His surveillance CT in 09/2011 was negative. Patient will have surveillance annually at this point in time. I have scheduled his next CT scan for April 2014. 2. History of coronary artery disease.  He is on aspirin, Plavix, lisinopril, metoprolol, and simvastatin.  3. Diabetes mellitus, type II.  He is on insulin glargine per PCP.  Reports several hypoglycemic episodes. He will continue to follow-up with PCP. 4. Hyperlipidemia.  He is on simvastatin per PCP.   5. Hypertension.  Slightly elevated  systolic blood pressure.  He is on lisinopril and metoprolol per PCP. 6. Follow-up. 6 months with labs and CT scan.  The length of time of the face-to-face encounter was 15 minutes. More than 50% of time was spent counseling and coordination of care.

## 2012-08-06 ENCOUNTER — Other Ambulatory Visit: Payer: Self-pay

## 2012-10-07 ENCOUNTER — Other Ambulatory Visit (HOSPITAL_BASED_OUTPATIENT_CLINIC_OR_DEPARTMENT_OTHER): Payer: Medicare Other

## 2012-10-07 ENCOUNTER — Ambulatory Visit (HOSPITAL_COMMUNITY)
Admission: RE | Admit: 2012-10-07 | Discharge: 2012-10-07 | Disposition: A | Discharge status: Home / Self Care | Payer: Medicare Other | Source: Ambulatory Visit | Attending: Oncology | Admitting: Oncology

## 2012-10-07 ENCOUNTER — Encounter (HOSPITAL_COMMUNITY): Payer: Self-pay

## 2012-10-07 DIAGNOSIS — I700 Atherosclerosis of aorta: Secondary | ICD-10-CM | POA: Insufficient documentation

## 2012-10-07 DIAGNOSIS — C8589 Other specified types of non-Hodgkin lymphoma, extranodal and solid organ sites: Secondary | ICD-10-CM | POA: Insufficient documentation

## 2012-10-07 DIAGNOSIS — C8510 Unspecified B-cell lymphoma, unspecified site: Secondary | ICD-10-CM

## 2012-10-07 DIAGNOSIS — K7689 Other specified diseases of liver: Secondary | ICD-10-CM | POA: Insufficient documentation

## 2012-10-07 DIAGNOSIS — C8581 Other specified types of non-Hodgkin lymphoma, lymph nodes of head, face, and neck: Secondary | ICD-10-CM

## 2012-10-07 LAB — COMPREHENSIVE METABOLIC PANEL (CC13)
Alkaline Phosphatase: 74 U/L (ref 40–150)
BUN: 13 mg/dL (ref 7.0–26.0)
CO2: 27 mEq/L (ref 22–29)
Glucose: 92 mg/dl (ref 70–99)
Total Bilirubin: 0.6 mg/dL (ref 0.20–1.20)

## 2012-10-07 LAB — LACTATE DEHYDROGENASE (CC13): LDH: 165 U/L (ref 125–245)

## 2012-10-07 LAB — CBC WITH DIFFERENTIAL/PLATELET
Basophils Absolute: 0.1 10*3/uL (ref 0.0–0.1)
Eosinophils Absolute: 0.4 10*3/uL (ref 0.0–0.5)
HCT: 51.2 % — ABNORMAL HIGH (ref 38.4–49.9)
HGB: 17.3 g/dL — ABNORMAL HIGH (ref 13.0–17.1)
LYMPH%: 11.2 % — ABNORMAL LOW (ref 14.0–49.0)
MCV: 90.3 fL (ref 79.3–98.0)
MONO#: 1 10*3/uL — ABNORMAL HIGH (ref 0.1–0.9)
MONO%: 9.5 % (ref 0.0–14.0)
NEUT#: 8.1 10*3/uL — ABNORMAL HIGH (ref 1.5–6.5)
NEUT%: 74.6 % (ref 39.0–75.0)
Platelets: 130 10*3/uL — ABNORMAL LOW (ref 140–400)

## 2012-10-07 MED ORDER — IOHEXOL 300 MG/ML  SOLN
125.0000 mL | Freq: Once | INTRAMUSCULAR | Status: AC | PRN
  Administered 2012-10-07: 125 mL via INTRAVENOUS

## 2012-10-10 ENCOUNTER — Telehealth: Payer: Self-pay | Admitting: Oncology

## 2012-10-10 ENCOUNTER — Ambulatory Visit (HOSPITAL_BASED_OUTPATIENT_CLINIC_OR_DEPARTMENT_OTHER): Payer: Medicare Other | Admitting: Oncology

## 2012-10-10 VITALS — BP 168/75 | HR 62 | Temp 96.6°F | Resp 18 | Ht 71.0 in | Wt 218.8 lb

## 2012-10-10 DIAGNOSIS — C8581 Other specified types of non-Hodgkin lymphoma, lymph nodes of head, face, and neck: Secondary | ICD-10-CM

## 2012-10-10 DIAGNOSIS — E119 Type 2 diabetes mellitus without complications: Secondary | ICD-10-CM

## 2012-10-10 DIAGNOSIS — I10 Essential (primary) hypertension: Secondary | ICD-10-CM

## 2012-10-10 DIAGNOSIS — C8510 Unspecified B-cell lymphoma, unspecified site: Secondary | ICD-10-CM

## 2012-10-10 NOTE — Progress Notes (Signed)
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  PAST THERPAY:  R-CHOP x 3 cycles between 12/27/09 and 02/07/10.  He also underwent consolidative XRT.  CURRENT THERAPY:  watchful observation.  INTERVAL HISTORY: [name] 72 y.o. male returns to clinic today by himself. He reports feeling well. He denies any palpable lymph node swelling, anorexia, weight loss, persistent diffuse night sweat, chest pain, abdominal pain, bleeding symptoms, nausea vomiting, skin rash, focal motor weakness. He has chronic dizziness due to history of stroke in the past. He denies any recent fall. The rest of the 14 point review of system was negative.   MEDICAL HISTORY: Past Medical History  Diagnosis Date  . 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 Hypertension   . Diabetes mellitus   . A-fib   . Cataract   . Charcot-Marie-Tooth disease   . B-cell lymphoma 10/09/2011    SURGICAL HISTORY: No past surgical history on file.  MEDICATIONS: Current Outpatient Prescriptions  Medication Sig Dispense Refill  . aspirin 325 MG tablet Take 325 mg by mouth 3 (three) times a week. Monday, Wednesday, and Friday.      . clopidogrel (PLAVIX) 75 MG tablet Take 75 mg by mouth daily after breakfast.       . insulin glargine (LANTUS) 100 UNIT/ML injection Inject 42 Units into the skin every evening.       [name] lisinopril (PRINIVIL,ZESTRIL) 20 MG tablet Take 20 mg by mouth every morning.       . metoprolol tartrate (LOPRESSOR) 25 MG tablet Take 25 mg by mouth 2 (two) times daily.       . simvastatin (ZOCOR) 20 MG tablet Take 20 mg by mouth at bedtime.         No current facility-administered medications for this visit.    ALLERGIES:  is allergic to actos; hydrochlorothiazide; and penicillins.  REVIEW OF SYSTEMS:  The rest of  the 14-point review of system was negative.   Filed Vitals:   10/10/12 0857  BP: 168/75  Pulse: 62  Temp: 96.6 F (35.9 C)  Resp: 18   Wt Readings from Last 3 Encounters:  10/10/12 218 lb 12.8 oz (99.247 kg)  04/08/12 216 lb 11.2 oz (98.294 kg)  10/09/11 213 lb 12.8 oz (96.979 kg)   ECOG Performance status: 1  PHYSICAL EXAMINATION:   General:  Mildly obese male in no acute distress.  Eyes:  no scleral icterus.  ENT:  There were no oropharyngeal lesions.  Neck was without thyromegaly.  Lymphatics:  Negative cervical, supraclavicular or axillary adenopathy.  Respiratory: lungs were clear bilaterally without wheezing or crackles.  Cardiovascular:  Regular rate and rhythm, S1/S2, without murmur, rub or gallop.  There was no pedal edema.  GI:  abdomen was soft, flat, nontender, nondistended, without organomegaly.  Muscoloskeletal:  no spinal tenderness of palpation of vertebral spine.  Skin exam was without echymosis, petichae.  Neuro exam was nonfocal.  Patient was able to get on and off exam table without assistance.  Gait was normal.  Patient was alert and oriented.  Attention was good.   Language was appropriate.  Mood was normal without depression.  Speech was not pressured.  Thought content was not tangential.     LABORATORY/RADIOLOGY DATA:  Lab Results  Component  Value Date   WBC 10.9* 10/07/2012   HGB 17.3* 10/07/2012   HCT 51.2* 10/07/2012   PLT 130* 10/07/2012   GLUCOSE 92 10/07/2012   CHOL  Value: 132        ATP III CLASSIFICATION:  <200     mg/dL   Desirable  161-096  mg/dL   Borderline High  >=045    mg/dL   High        09/28/8117   TRIG 64 12/03/2008   HDL 19* 12/03/2008   LDLCALC  Value: 100        Total Cholesterol/HDL:CHD Risk Coronary Heart Disease Risk Table                     Men   Women  1/2 Average Risk   3.4   3.3  Average Risk       5.0   4.4  2 X Average Risk   9.6   7.1  3 X Average Risk  23.4   11.0        Use the calculated Patient Ratio above and the CHD Risk Table  to determine the patient's CHD Risk.        ATP III CLASSIFICATION (LDL):  <100     mg/dL   Optimal  147-829  mg/dL   Near or Above                    Optimal  130-159  mg/dL   Borderline  562-130  mg/dL   High  >865     mg/dL   Very High* 7/84/6962   ALT 16 10/07/2012   AST 15 10/07/2012   NA 139 10/07/2012   K 4.1 10/07/2012   CL 101 10/07/2012   CREATININE 0.7 10/07/2012   BUN 13.0 10/07/2012   CO2 27 10/07/2012   INR 1.10 12/12/2009   HGBA1C  Value: 9.0 (NOTE) The ADA recommends the following therapeutic goal for glycemic control related to Hgb A1c measurement: Goal of therapy: <6.5 Hgb A1c  Reference: American Diabetes Association: Clinical Practice Recommendations 2010, Diabetes Care, 2010, 33: (Suppl  1).* 12/02/2008   Radiology: I personally reviewed the following CT scan  CT NECK  Findings: The visualized portion of the brain is unremarkable.  The major vascular structures appear patent. The skull base is  unremarkable. The visualized paranasal sinuses mastoid air cells  are clear.  The parotid and submandibular glands are normal. No neck mass or  lymphadenopathy. The tongue base and floor the mouth are normal.  The epiglottis and periglottic fat planes are normal.  The thyroid gland is normal. No supraclavicular adenopathy.  IMPRESSION:  No neck mass or lymphadenopathy.   CT CHEST  Findings: The chest wall is unremarkable. No supraclavicular or  axillary lymphadenopathy. The thyroid gland is normal. The bony  thorax is intact. No destructive bone lesions or spinal canal  compromise.  The heart is normal in size. No pericardial effusion. No  mediastinal or hilar lymphadenopathy. There are atherosclerotic  calcifications involving the aorta and branch vessels including the  coronary arteries.  The esophagus is normal.  Examination of the lung parenchyma demonstrates no acute pulmonary  findings. No worrisome pulmonary masses or nodules. No pleural  effusion.   IMPRESSION:   Unremarkable and stable CT appearance of the chest. No  lymphadenopathy.  Stable advanced atherosclerotic calcifications involving the  thoracic aorta and branch vessels.   CT ABDOMEN AND PELVIS  Findings: There is mild diffuse  fatty infiltration of the liver but  no focal hepatic lesions or intrahepatic biliary dilatation.  Possible small gallstones. No common bile duct dilatation. The  pancreas is normal. Prominent fatty interstices are stable.  The spleen demonstrates multiple low attenuation lesions and  calcifications. These are stable. The adrenal glands and kidneys  are normal except for small renal cysts.  The stomach, duodenum, small bowel and colon are unremarkable. No  inflammatory changes or mass lesions. No mesenteric or  retroperitoneal mass or adenopathy. The appendix is normal. There  are dense atherosclerotic calcifications involving the aorta along  with calcifications at the major branch vessel ostia. No focal  aneurysm or dissection.  The bladder is unremarkable. The prostate gland is mildly  enlarged. The seminal vesicles are normal. No pelvic mass,  adenopathy or free pelvic fluid collections. No inguinal mass or  adenopathy. Benign appearing fatty lymph nodes are noted  bilaterally.  Examination the bony structures demonstrates mild osteoporosis. No  destructive bone lesions.   IMPRESSION:  Unremarkable CT abdomen/pelvis. No adenopathy or mass.  Advanced atherosclerotic calcifications involving the aorta but no  aneurysm.    ASSESSMENT AND PLAN:   1. History of diffuse large B-cell lymphoma: Continue to be in remission. His neck CT scans about 1 year from now. 2. Mild leukocytosis.  Resolved.  3. History of coronary artery disease.  He is on aspirin, Plavix, lisinopril, metoprolol, and simvastatin.  4. Diabetes mellitus, type II.  He is on insulin glargine per PCP.  His Glc today (albeit nonfasting) was elevated.  I advised him to f/u with his PCP for  further titration of insulin. 5. Hyperlipidemia.  He is on simvastatin per PCP.   6. Hypertension. Still elevated BP.   He is on lisinopril and metoprolol per PCP.  I advised him to follow up with his PCP for titration of his BP meds as appropriate.  7. History of stroke: I advised him to control his blood pressure better. He had history of carotid disease. His last neck ultrasound per his report was more than 2 years ago.  I advised him to talk with his PCP to see if he should have another carotid US for follow up if appropriate.  8. Followup: In about 6 months for lab and return visit. I informed Mr. Shonk am leaving the practice. When he returns in 6 months, this can be resumed by any physician.  The length of time of the face-to-face encounter was 15 minutes. More than 50% of time was spent counseling and coordination of care.

## 2012-12-06 ENCOUNTER — Other Ambulatory Visit: Payer: Self-pay | Admitting: Gastroenterology

## 2013-01-25 ENCOUNTER — Other Ambulatory Visit: Payer: Self-pay

## 2013-04-07 ENCOUNTER — Telehealth: Payer: Self-pay | Admitting: Hematology and Oncology

## 2013-04-07 NOTE — Telephone Encounter (Signed)
Moved 10/21 appts 10/29 w/NG. lmonvm for pt re change w/new appt d/t or 10/29. Schedule mailed.

## 2013-04-11 ENCOUNTER — Other Ambulatory Visit: Payer: Medicare Other | Admitting: Lab

## 2013-04-11 ENCOUNTER — Ambulatory Visit: Payer: Medicare Other | Admitting: Oncology

## 2013-04-19 ENCOUNTER — Telehealth: Payer: Self-pay | Admitting: *Deleted

## 2013-04-19 ENCOUNTER — Other Ambulatory Visit: Payer: Self-pay | Admitting: Hematology and Oncology

## 2013-04-19 ENCOUNTER — Encounter: Payer: Self-pay | Admitting: Hematology and Oncology

## 2013-04-19 ENCOUNTER — Ambulatory Visit (HOSPITAL_BASED_OUTPATIENT_CLINIC_OR_DEPARTMENT_OTHER): Payer: Medicare Other | Admitting: Hematology and Oncology

## 2013-04-19 ENCOUNTER — Other Ambulatory Visit (HOSPITAL_BASED_OUTPATIENT_CLINIC_OR_DEPARTMENT_OTHER): Payer: Medicare Other | Admitting: Lab

## 2013-04-19 VITALS — BP 205/86 | HR 76 | Temp 97.4°F | Resp 20 | Ht 71.0 in | Wt 217.4 lb

## 2013-04-19 DIAGNOSIS — C8510 Unspecified B-cell lymphoma, unspecified site: Secondary | ICD-10-CM

## 2013-04-19 DIAGNOSIS — C8581 Other specified types of non-Hodgkin lymphoma, lymph nodes of head, face, and neck: Secondary | ICD-10-CM

## 2013-04-19 DIAGNOSIS — I10 Essential (primary) hypertension: Secondary | ICD-10-CM

## 2013-04-19 DIAGNOSIS — D696 Thrombocytopenia, unspecified: Secondary | ICD-10-CM

## 2013-04-19 LAB — COMPREHENSIVE METABOLIC PANEL (CC13)
ALT: 18 U/L (ref 0–55)
Anion Gap: 8 mEq/L (ref 3–11)
BUN: 14.5 mg/dL (ref 7.0–26.0)
CO2: 24 mEq/L (ref 22–29)
Creatinine: 0.8 mg/dL (ref 0.7–1.3)
Glucose: 313 mg/dl — ABNORMAL HIGH (ref 70–140)
Total Bilirubin: 0.34 mg/dL (ref 0.20–1.20)

## 2013-04-19 LAB — CBC WITH DIFFERENTIAL/PLATELET
BASO%: 0.7 % (ref 0.0–2.0)
Basophils Absolute: 0.1 10*3/uL (ref 0.0–0.1)
HCT: 45.9 % (ref 38.4–49.9)
LYMPH%: 12.3 % — ABNORMAL LOW (ref 14.0–49.0)
MCHC: 33.6 g/dL (ref 32.0–36.0)
MONO#: 1.1 10*3/uL — ABNORMAL HIGH (ref 0.1–0.9)
NEUT%: 72.3 % (ref 39.0–75.0)
Platelets: 122 10*3/uL — ABNORMAL LOW (ref 140–400)
WBC: 10.3 10*3/uL (ref 4.0–10.3)

## 2013-04-19 LAB — MORPHOLOGY

## 2013-04-19 NOTE — Telephone Encounter (Signed)
appts made and printed...td 

## 2013-04-19 NOTE — Progress Notes (Signed)
[facility] OFFICE PROGRESS NOTE  Patient Care Team: Lupe Carney, MD as PCP - General (Family Medicine) Bjorn Pippin, MD as Attending Physician (Urology) Drema Halon, MD Delon Sacramento, MD (Ophthalmology)  DIAGNOSIS: Stage I diffuse large B-cell lymphoma  SUMMARY OF ONCOLOGIC HISTORY: This is a pleasant 72 year old gentleman was diagnosed with lymphoma 3 years ago. According to the patient, he was diagnosed incidentally when he was undergoing carotid ultrasound for history of stroke. Biopsy from the right neck lymph node confirm diagnosis of diffuse large B-cell lymphoma. Between July to August 2011, he received 3 cycles of R. CHOP chemotherapy followed by consolidation radiation therapy.  INTERVAL HISTORY: [name] 72 y.o. male returns for further followup. He is doing very well. Denies any new palpable lymphadenopathy. He denies any recent fever, chills, night sweats or abnormal weight loss The patient is on antiplatelet agent for history of stroke. Apart from easy bruising, he denies any active bleeding such as epistaxis, hematuria, or hematochezia.  I have reviewed the past medical history, past surgical history, social history and family history with the patient and they are unchanged from previous note.  ALLERGIES:  is allergic to actos; hydrochlorothiazide; and penicillins.  MEDICATIONS:  Current Outpatient Prescriptions  Medication Sig Dispense Refill  . aspirin 325 MG tablet Take 325 mg by mouth 3 (three) times a week. Monday, Wednesday, and Friday.      . clopidogrel (PLAVIX) 75 MG tablet Take 75 mg by mouth daily after breakfast.       . insulin glargine (LANTUS) 100 UNIT/ML injection Inject 42 Units into the skin every evening.       [name] lisinopril (PRINIVIL,ZESTRIL) 20 MG tablet Take 20 mg by mouth every morning.       . metoprolol tartrate (LOPRESSOR) 25 MG tablet Take 25 mg by mouth 2 (two) times daily.       . Probiotic Product (ALIGN PO)  Take by mouth daily.      . simvastatin (ZOCOR) 20 MG tablet Take 20 mg by mouth at bedtime.         No current facility-administered medications for this visit.    REVIEW OF SYSTEMS:   Constitutional: Denies fevers, chills or abnormal weight loss Eyes: Denies blurriness of vision Ears, nose, mouth, throat, and face: Denies mucositis or sore throat Respiratory: Denies cough, dyspnea or wheezes Cardiovascular: Denies palpitation, chest discomfort or lower extremity swelling Gastrointestinal:  Denies nausea, heartburn or change in bowel habits Skin: Denies abnormal skin rashes Lymphatics: Denies new lymphadenopathy Neurological:Denies numbness, tingling or new weaknesses Behavioral/Psych: Mood is stable, no new changes  All other systems were reviewed with the patient and are negative.  PHYSICAL EXAMINATION: ECOG PERFORMANCE STATUS: 1 - Symptomatic but completely ambulatory  Filed Vitals:   04/19/13 1303  BP: 205/86  Pulse: 76  Temp: 97.4 F (36.3 C)  Resp: 20   Filed Weights   04/19/13 1303  Weight: 217 lb 6.4 oz (98.612 kg)    GENERAL:alert, no distress and comfortable. Patient will elderly an obese SKIN: skin color, texture, turgor are normal, no rashes or significant lesions apart from some bruises on his upper extremity EYES: normal, Conjunctiva are pink and non-injected, sclera clear OROPHARYNX:no exudate, no erythema and lips, buccal mucosa, and tongue normal  NECK: supple, thyroid normal size, non-tender, without nodularity LYMPH:  no palpable lymphadenopathy in the cervical, axillary or inguinal LUNGS: clear to auscultation and percussion with normal breathing effort HEART: regular rate & rhythm and no  murmurs and no lower extremity edema ABDOMEN:abdomen soft, non-tender and normal bowel sounds unable to appreciate splenomegaly due to morbid obesity Musculoskeletal:no cyanosis of digits and no clubbing  NEURO: alert & oriented x 3 with fluent speech, no focal  motor/sensory deficits  LABORATORY DATA:  I have reviewed the data as listed    Component Value Date/Time   NA 141 04/19/2013 1252   NA 141 10/08/2011 0918   NA 140 06/10/2011 1318   K 4.3 04/19/2013 1252   K 4.4 10/08/2011 0918   K 4.6 06/10/2011 1318   CL 101 10/07/2012 0907   CL 103 10/08/2011 0918   CL 103 06/10/2011 1318   CO2 24 04/19/2013 1252   CO2 27 10/08/2011 0918   CO2 27 06/10/2011 1318   GLUCOSE 313* 04/19/2013 1252   GLUCOSE 92 10/07/2012 0907   GLUCOSE 96 10/08/2011 0918   GLUCOSE 299* 06/10/2011 1318   BUN 14.5 04/19/2013 1252   BUN 10 10/08/2011 0918   BUN 14 06/10/2011 1318   CREATININE 0.8 04/19/2013 1252   CREATININE 0.9 10/08/2011 0918   CREATININE 0.65 06/10/2011 1318   CALCIUM 8.8 04/19/2013 1252   CALCIUM 8.4 10/08/2011 0918   CALCIUM 8.7 06/10/2011 1318   PROT 6.0* 04/19/2013 1252   PROT 6.8 10/08/2011 0918   PROT 6.0 06/10/2011 1318   ALBUMIN 2.9* 04/19/2013 1252   ALBUMIN 3.5 06/10/2011 1318   AST 16 04/19/2013 1252   AST 21 10/08/2011 0918   AST 19 06/10/2011 1318   ALT 18 04/19/2013 1252   ALT 30 10/08/2011 0918   ALT 22 06/10/2011 1318   ALKPHOS 68 04/19/2013 1252   ALKPHOS 71 10/08/2011 0918   ALKPHOS 67 06/10/2011 1318   BILITOT 0.34 04/19/2013 1252   BILITOT 0.80 10/08/2011 0918   BILITOT 0.3 06/10/2011 1318   GFRNONAA >60 12/26/2010 1020   GFRAA >60 12/26/2010 1020    No results found for this basename: SPEP, UPEP,  kappa and lambda light chains    Lab Results  Component Value Date   WBC 10.3 04/19/2013   NEUTROABS 7.4* 04/19/2013   HGB 15.4 04/19/2013   HCT 45.9 04/19/2013   MCV 91.0 04/19/2013   PLT 122* 04/19/2013      Chemistry      Component Value Date/Time   NA 141 04/19/2013 1252   NA 141 10/08/2011 0918   NA 140 06/10/2011 1318   K 4.3 04/19/2013 1252   K 4.4 10/08/2011 0918   K 4.6 06/10/2011 1318   CL 101 10/07/2012 0907   CL 103 10/08/2011 0918   CL 103 06/10/2011 1318   CO2 24 04/19/2013 1252   CO2 27 10/08/2011 0918    CO2 27 06/10/2011 1318   BUN 14.5 04/19/2013 1252   BUN 10 10/08/2011 0918   BUN 14 06/10/2011 1318   CREATININE 0.8 04/19/2013 1252   CREATININE 0.9 10/08/2011 0918   CREATININE 0.65 06/10/2011 1318      Component Value Date/Time   CALCIUM 8.8 04/19/2013 1252   CALCIUM 8.4 10/08/2011 0918   CALCIUM 8.7 06/10/2011 1318   ALKPHOS 68 04/19/2013 1252   ALKPHOS 71 10/08/2011 0918   ALKPHOS 67 06/10/2011 1318   AST 16 04/19/2013 1252   AST 21 10/08/2011 0918   AST 19 06/10/2011 1318   ALT 18 04/19/2013 1252   ALT 30 10/08/2011 0918   ALT 22 06/10/2011 1318   BILITOT 0.34 04/19/2013 1252   BILITOT 0.80 10/08/2011 0918   BILITOT 0.3 06/10/2011  1318      ASSESSMENT:  #1 history of diffuse large B-cell lymphoma, no evidence of disease #2 chronic thrombocytopenia, likely related to fatty liver disease  PLAN:  #1 history of diffuse large B-cell lymphoma  there are no evidence of disease on history and physical examination. I educated the patient's signs and symptoms to watch out for. The present time, there is no clinical benefit of routine imaging study. I will continue to see him every 6 months with history, physical examination, and but will. #2 thrombocytopenia The cause is unknown. It is mild and there is little change compared from previous platelet count. The patient denies recent history of bleeding such as epistaxis, hematuria or hematochezia. He is asymptomatic from the thrombocytopenia. I will observe for now.  he does not require transfusion now. I suspect the most likely cause of this is fatty liver disease given his history of diabetes and morbid obesity #3 severe hypertension His blood pressure is very high but I suspect this an element of anxiety. We will recheck his blood pressure the second time, his systolic blood pressure has improved to 150. I recommend the patient to continue followup with his primary care provider for blood pressure medication adjustment, given history  of stroke. #4 preventive care I recommend influenza vaccination. The patient declined.2  Orders Placed This Encounter  Procedures  . CBC with Differential    Standing Status: Future     Number of Occurrences:      Standing Expiration Date: 01/09/2014  . Comprehensive metabolic panel    Standing Status: Future     Number of Occurrences:      Standing Expiration Date: 04/19/2014  . Morphology    Standing Status: Future     Number of Occurrences:      Standing Expiration Date: 04/19/2014  . Lactate dehydrogenase    Standing Status: Future     Number of Occurrences:      Standing Expiration Date: 04/19/2014   All questions were answered. The patient knows to call the clinic with any problems, questions or concerns. No barriers to learning was detected. I spent 25 minutes counseling the patient face to face. The total time spent in the appointment was 40 minutes and more than 50% was on counseling and review of test results     [facility], Khloey Chern, MD 04/19/2013 1:47 PM

## 2013-04-20 LAB — ERYTHROPOIETIN: Erythropoietin: 7.4 m[IU]/mL (ref 2.6–18.5)

## 2013-04-27 ENCOUNTER — Other Ambulatory Visit: Payer: Self-pay

## 2013-05-26 ENCOUNTER — Ambulatory Visit (INDEPENDENT_AMBULATORY_CARE_PROVIDER_SITE_OTHER): Payer: Medicare Other

## 2013-05-26 VITALS — BP 144/64 | HR 65 | Resp 12 | Ht 71.0 in | Wt 215.0 lb

## 2013-05-26 DIAGNOSIS — E1142 Type 2 diabetes mellitus with diabetic polyneuropathy: Secondary | ICD-10-CM

## 2013-05-26 DIAGNOSIS — E1140 Type 2 diabetes mellitus with diabetic neuropathy, unspecified: Secondary | ICD-10-CM

## 2013-05-26 DIAGNOSIS — E1149 Type 2 diabetes mellitus with other diabetic neurological complication: Secondary | ICD-10-CM

## 2013-05-26 DIAGNOSIS — B351 Tinea unguium: Secondary | ICD-10-CM

## 2013-05-26 DIAGNOSIS — M79609 Pain in unspecified limb: Secondary | ICD-10-CM

## 2013-05-26 DIAGNOSIS — Q828 Other specified congenital malformations of skin: Secondary | ICD-10-CM

## 2013-05-26 NOTE — Patient Instructions (Signed)
Diabetes and Foot Care Diabetes may cause you to have problems because of poor blood supply (circulation) to your feet and legs. This may cause the skin on your feet to become thinner, break easier, and heal more slowly. Your skin may become dry, and the skin may peel and crack. You may also have nerve damage in your legs and feet causing decreased feeling in them. You may not notice minor injuries to your feet that could lead to infections or more serious problems. Taking care of your feet is one of the most important things you can do for yourself.  HOME CARE INSTRUCTIONS  Wear shoes at all times, even in the house. Do not go barefoot. Bare feet are easily injured.  Check your feet daily for blisters, cuts, and redness. If you cannot see the bottom of your feet, use a mirror or ask someone for help.  Wash your feet with warm water (do not use hot water) and mild soap. Then pat your feet and the areas between your toes until they are completely dry. Do not soak your feet as this can dry your skin.  Apply a moisturizing lotion or petroleum jelly (that does not contain alcohol and is unscented) to the skin on your feet and to dry, brittle toenails. Do not apply lotion between your toes.  Trim your toenails straight across. Do not dig under them or around the cuticle. File the edges of your nails with an emery board or nail file.  Do not cut corns or calluses or try to remove them with medicine.  Wear clean socks or stockings every day. Make sure they are not too tight. Do not wear knee-high stockings since they may decrease blood flow to your legs.  Wear shoes that fit properly and have enough cushioning. To break in new shoes, wear them for just a few hours a day. This prevents you from injuring your feet. Always look in your shoes before you put them on to be sure there are no objects inside.  Do not cross your legs. This may decrease the blood flow to your feet.  If you find a minor scrape,  cut, or break in the skin on your feet, keep it and the skin around it clean and dry. These areas may be cleansed with mild soap and water. Do not cleanse the area with peroxide, alcohol, or iodine.  When you remove an adhesive bandage, be sure not to damage the skin around it.  If you have a wound, look at it several times a day to make sure it is healing.  Do not use heating pads or hot water bottles. They may burn your skin. If you have lost feeling in your feet or legs, you may not know it is happening until it is too late.  Make sure your health care provider performs a complete foot exam at least annually or more often if you have foot problems. Report any cuts, sores, or bruises to your health care provider immediately. SEEK MEDICAL CARE IF:   You have an injury that is not healing.  You have cuts or breaks in the skin.  You have an ingrown nail.  You notice redness on your legs or feet.  You feel burning or tingling in your legs or feet.  You have pain or cramps in your legs and feet.  Your legs or feet are numb.  Your feet always feel cold. SEEK IMMEDIATE MEDICAL CARE IF:   There is increasing redness,   swelling, or pain in or around a wound.  There is a red line that goes up your leg.  Pus is coming from a wound.  You develop a fever or as directed by your health care provider.  You notice a bad smell coming from an ulcer or wound. Document Released: 06/05/2000 Document Revised: 02/08/2013 Document Reviewed: 11/15/2012 [employer] Patient Information 2014 [employer].  

## 2013-05-26 NOTE — Progress Notes (Signed)
   Subjective:    Patient ID: [name], male    DOB: 03/23/1941, 72 y.o.   MRN: [id number]  Toe Pain  The incident occurred more than 1 week ago. Incident location: N/A. The injury mechanism is unknown. The pain is present in the right foot. Quality: NO FEELING ON THE RT FOOT. The pain is at a severity of 0/10. The patient is experiencing no pain. Pain course: NO CHANGE. Associated symptoms include numbness. It is unknown if a foreign body is present. Nothing aggravates the symptoms. He has tried nothing for the symptoms. The treatment provided no relief.      Review of Systems  HENT: Positive for hearing loss.   Eyes: Positive for visual disturbance.  Musculoskeletal: Positive for gait problem.  Neurological: Positive for numbness.  All other systems reviewed and are negative.       Objective:   Physical Exam Neurovascular status is intact although diminished DP plus one over 4 bilateral PT plus one over 4 bilateral. Refill time 3 seconds all digits. No edema rubor pallor mild varicosities noted. Neurologically epicritic and proprioceptive sensations are diminished on Semmes Weinstein testing to forefoot digits and plantar arch bilateral. Patient had previous dictation third toe right foot second toe right foot does have a distal clavus present with hemorrhage a keratoses no active bleeding or discharge noted on presentation. No ascending cellulitis or lymphangitis noted. The remaining toes have nails are thick and hypertrophic brittle and discolored and friable. Orthopedic exam remarkable for semirigid digital contractures and osteoarthropathy and stiffness the foot and ankle patient does have abnormal gait worsen AFO brace as well as appropriate diabetic shoes.       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 At this time painful dystrophic friable mycotic nails debrided x9 the presence of diabetes and complications  also at this time debridement still keratotic lesion hemorrhage a keratoses distal second digit right foot dressed with Neosporin and Band-Aid dressing. Also dispensed and tubercle padding to keep her protected and cushion in his shoes. Reappointed 3 months for followup and continued palliative care is needed X.  Alvan Dame DPM

## 2013-08-25 ENCOUNTER — Ambulatory Visit (INDEPENDENT_AMBULATORY_CARE_PROVIDER_SITE_OTHER): Payer: Medicare Other

## 2013-08-25 VITALS — BP 153/71 | HR 61 | Resp 16

## 2013-08-25 DIAGNOSIS — Q828 Other specified congenital malformations of skin: Secondary | ICD-10-CM

## 2013-08-25 DIAGNOSIS — M79609 Pain in unspecified limb: Secondary | ICD-10-CM

## 2013-08-25 DIAGNOSIS — E1140 Type 2 diabetes mellitus with diabetic neuropathy, unspecified: Secondary | ICD-10-CM

## 2013-08-25 DIAGNOSIS — B351 Tinea unguium: Secondary | ICD-10-CM

## 2013-08-25 DIAGNOSIS — E1149 Type 2 diabetes mellitus with other diabetic neurological complication: Secondary | ICD-10-CM

## 2013-08-25 NOTE — Progress Notes (Signed)
   Subjective:    Patient ID: [name], male    DOB: April 19, 1941, 73 y.o.   MRN: [id number]  HPI Comments: "He usually cuts my toenails"      Review of Systems no new changes or findings     Objective:   Physical Exam  lower extremity objective findings as follows he'll pulses DP plus one over 4 bilateral PT one over 4 bilateral Refill timed 3-4 seconds history of amputation toes right foot patient does have diminished skin texture and turgor absent sensation Semmes Weinstein noted bilateral. Has history of previous ulcerations dictation third toe right foot and second toe does have some distal clavus keratoses with hemorrhage a keratotic lesion subsecond third metatarsal area left foot which is debrided this time. Nails thick brittle criptotic incurvated and friable 1 through 5 left 1235 right.       Assessment & Plan:  Assessment diabetes with history of complications peripheral neuropathy and angiopathy. Multiple dystrophic mycotic brittle nails are debrided x8 also at this time debridement still keratotic lesion sub-23 left followup in the future as needed for continued palliative care following debridement the lesion sub-of the left hallux and sub-third MTP area left [name] with lumicain Neosporin and Band-Aid dressings. Reappointed in 3 months for continued palliative care is needed  Harriet Masson DPM

## 2013-08-25 NOTE — Patient Instructions (Signed)
Diabetes and Foot Care Diabetes may cause you to have problems because of poor blood supply (circulation) to your feet and legs. This may cause the skin on your feet to become thinner, break easier, and heal more slowly. Your skin may become dry, and the skin may peel and crack. You may also have nerve damage in your legs and feet causing decreased feeling in them. You may not notice minor injuries to your feet that could lead to infections or more serious problems. Taking care of your feet is one of the most important things you can do for yourself.  HOME CARE INSTRUCTIONS  Wear shoes at all times, even in the house. Do not go barefoot. Bare feet are easily injured.  Check your feet daily for blisters, cuts, and redness. If you cannot see the bottom of your feet, use a mirror or ask someone for help.  Wash your feet with warm water (do not use hot water) and mild soap. Then pat your feet and the areas between your toes until they are completely dry. Do not soak your feet as this can dry your skin.  Apply a moisturizing lotion or petroleum jelly (that does not contain alcohol and is unscented) to the skin on your feet and to dry, brittle toenails. Do not apply lotion between your toes.  Trim your toenails straight across. Do not dig under them or around the cuticle. File the edges of your nails with an emery board or nail file.  Do not cut corns or calluses or try to remove them with medicine.  Wear clean socks or stockings every day. Make sure they are not too tight. Do not wear knee-high stockings since they may decrease blood flow to your legs.  Wear shoes that fit properly and have enough cushioning. To break in new shoes, wear them for just a few hours a day. This prevents you from injuring your feet. Always look in your shoes before you put them on to be sure there are no objects inside.  Do not cross your legs. This may decrease the blood flow to your feet.  If you find a minor scrape,  cut, or break in the skin on your feet, keep it and the skin around it clean and dry. These areas may be cleansed with mild soap and water. Do not cleanse the area with peroxide, alcohol, or iodine.  When you remove an adhesive bandage, be sure not to damage the skin around it.  If you have a wound, look at it several times a day to make sure it is healing.  Do not use heating pads or hot water bottles. They may burn your skin. If you have lost feeling in your feet or legs, you may not know it is happening until it is too late.  Make sure your health care provider performs a complete foot exam at least annually or more often if you have foot problems. Report any cuts, sores, or bruises to your health care provider immediately. SEEK MEDICAL CARE IF:   You have an injury that is not healing.  You have cuts or breaks in the skin.  You have an ingrown nail.  You notice redness on your legs or feet.  You feel burning or tingling in your legs or feet.  You have pain or cramps in your legs and feet.  Your legs or feet are numb.  Your feet always feel cold. SEEK IMMEDIATE MEDICAL CARE IF:   There is increasing redness,   swelling, or pain in or around a wound.  There is a red line that goes up your leg.  Pus is coming from a wound.  You develop a fever or as directed by your health care provider.  You notice a bad smell coming from an ulcer or wound. Document Released: 06/05/2000 Document Revised: 02/08/2013 Document Reviewed: 11/15/2012 [employer] Patient Information 2014 [employer].  

## 2013-09-16 ENCOUNTER — Emergency Department (HOSPITAL_COMMUNITY): Payer: Medicare Other

## 2013-09-16 ENCOUNTER — Observation Stay (HOSPITAL_COMMUNITY): Payer: Medicare Other

## 2013-09-16 ENCOUNTER — Encounter (HOSPITAL_COMMUNITY): Payer: Self-pay | Admitting: Emergency Medicine

## 2013-09-16 ENCOUNTER — Inpatient Hospital Stay (HOSPITAL_COMMUNITY)
Admission: EM | Admit: 2013-09-16 | Discharge: 2013-09-18 | DRG: 690 | Disposition: A | Discharge status: Home / Self Care | Payer: Medicare Other | Attending: Internal Medicine | Admitting: Internal Medicine

## 2013-09-16 DIAGNOSIS — C8510 Unspecified B-cell lymphoma, unspecified site: Secondary | ICD-10-CM | POA: Diagnosis present

## 2013-09-16 DIAGNOSIS — I4891 Unspecified atrial fibrillation: Secondary | ICD-10-CM | POA: Diagnosis present

## 2013-09-16 DIAGNOSIS — R296 Repeated falls: Secondary | ICD-10-CM

## 2013-09-16 DIAGNOSIS — E119 Type 2 diabetes mellitus without complications: Secondary | ICD-10-CM | POA: Diagnosis present

## 2013-09-16 DIAGNOSIS — I69998 Other sequelae following unspecified cerebrovascular disease: Secondary | ICD-10-CM

## 2013-09-16 DIAGNOSIS — I10 Essential (primary) hypertension: Secondary | ICD-10-CM | POA: Diagnosis present

## 2013-09-16 DIAGNOSIS — A498 Other bacterial infections of unspecified site: Secondary | ICD-10-CM | POA: Diagnosis present

## 2013-09-16 DIAGNOSIS — Z794 Long term (current) use of insulin: Secondary | ICD-10-CM

## 2013-09-16 DIAGNOSIS — E785 Hyperlipidemia, unspecified: Secondary | ICD-10-CM | POA: Diagnosis present

## 2013-09-16 DIAGNOSIS — E1142 Type 2 diabetes mellitus with diabetic polyneuropathy: Secondary | ICD-10-CM | POA: Diagnosis present

## 2013-09-16 DIAGNOSIS — Z87891 Personal history of nicotine dependence: Secondary | ICD-10-CM

## 2013-09-16 DIAGNOSIS — Z9181 History of falling: Secondary | ICD-10-CM

## 2013-09-16 DIAGNOSIS — C8589 Other specified types of non-Hodgkin lymphoma, extranodal and solid organ sites: Secondary | ICD-10-CM | POA: Diagnosis present

## 2013-09-16 DIAGNOSIS — N390 Urinary tract infection, site not specified: Principal | ICD-10-CM | POA: Diagnosis present

## 2013-09-16 DIAGNOSIS — E1149 Type 2 diabetes mellitus with other diabetic neurological complication: Secondary | ICD-10-CM | POA: Diagnosis present

## 2013-09-16 DIAGNOSIS — Z7982 Long term (current) use of aspirin: Secondary | ICD-10-CM

## 2013-09-16 DIAGNOSIS — D696 Thrombocytopenia, unspecified: Secondary | ICD-10-CM | POA: Diagnosis present

## 2013-09-16 DIAGNOSIS — D72829 Elevated white blood cell count, unspecified: Secondary | ICD-10-CM | POA: Diagnosis present

## 2013-09-16 DIAGNOSIS — Z79899 Other long term (current) drug therapy: Secondary | ICD-10-CM

## 2013-09-16 DIAGNOSIS — L97509 Non-pressure chronic ulcer of other part of unspecified foot with unspecified severity: Secondary | ICD-10-CM | POA: Diagnosis present

## 2013-09-16 DIAGNOSIS — Z7902 Long term (current) use of antithrombotics/antiplatelets: Secondary | ICD-10-CM

## 2013-09-16 DIAGNOSIS — Z9221 Personal history of antineoplastic chemotherapy: Secondary | ICD-10-CM

## 2013-09-16 DIAGNOSIS — R29898 Other symptoms and signs involving the musculoskeletal system: Secondary | ICD-10-CM | POA: Diagnosis present

## 2013-09-16 DIAGNOSIS — Z8673 Personal history of transient ischemic attack (TIA), and cerebral infarction without residual deficits: Secondary | ICD-10-CM

## 2013-09-16 LAB — URINALYSIS, ROUTINE W REFLEX MICROSCOPIC
Bilirubin Urine: NEGATIVE
Glucose, UA: NEGATIVE mg/dL
Ketones, ur: 15 mg/dL — AB
Nitrite: POSITIVE — AB
PH: 5 (ref 5.0–8.0)
Protein, ur: 30 mg/dL — AB
Specific Gravity, Urine: 1.023 (ref 1.005–1.030)
Urobilinogen, UA: 1 mg/dL (ref 0.0–1.0)

## 2013-09-16 LAB — COMPREHENSIVE METABOLIC PANEL
ALT: 12 U/L (ref 0–53)
AST: 13 U/L (ref 0–37)
Albumin: 3 g/dL — ABNORMAL LOW (ref 3.5–5.2)
Alkaline Phosphatase: 75 U/L (ref 39–117)
BILIRUBIN TOTAL: 0.7 mg/dL (ref 0.3–1.2)
BUN: 21 mg/dL (ref 6–23)
CHLORIDE: 101 meq/L (ref 96–112)
CO2: 25 meq/L (ref 19–32)
CREATININE: 0.72 mg/dL (ref 0.50–1.35)
Calcium: 9 mg/dL (ref 8.4–10.5)
GFR calc Af Amer: >90 mL/min (ref >90)
GFR calc non Af Amer: >90 mL/min (ref >90)
Glucose, Bld: 182 mg/dL — ABNORMAL HIGH (ref 70–99)
Potassium: 4 mEq/L (ref 3.7–5.3)
Sodium: 141 mEq/L (ref 137–147)
Total Protein: 6.3 g/dL (ref 6.0–8.3)

## 2013-09-16 LAB — RAPID URINE DRUG SCREEN, HOSP PERFORMED
Amphetamines: NOT DETECTED
Barbiturates: NOT DETECTED
Benzodiazepines: NOT DETECTED
Cocaine: NOT DETECTED
Opiates: NOT DETECTED
Tetrahydrocannabinol: NOT DETECTED

## 2013-09-16 LAB — DIFFERENTIAL
BASOS ABS: 0 10*3/uL (ref 0.0–0.1)
Basophils Relative: 0 % (ref 0–1)
EOS PCT: 0 % (ref 0–5)
Eosinophils Absolute: 0 10*3/uL (ref 0.0–0.7)
LYMPHS ABS: 2.1 10*3/uL (ref 0.7–4.0)
Lymphocytes Relative: 9 % — ABNORMAL LOW (ref 12–46)
Monocytes Absolute: 1.9 10*3/uL — ABNORMAL HIGH (ref 0.1–1.0)
Monocytes Relative: 8 % (ref 3–12)
NEUTROS ABS: 19.3 10*3/uL — AB (ref 1.7–7.7)
Neutrophils Relative %: 83 % — ABNORMAL HIGH (ref 43–77)

## 2013-09-16 LAB — URINE MICROSCOPIC-ADD ON

## 2013-09-16 LAB — ETHANOL

## 2013-09-16 LAB — CBC
HCT: 45.1 % (ref 39.0–52.0)
Hemoglobin: 15.8 g/dL (ref 13.0–17.0)
MCH: 31.2 pg (ref 26.0–34.0)
MCHC: 35 g/dL (ref 30.0–36.0)
MCV: 89.1 fL (ref 78.0–100.0)
Platelets: 123 10*3/uL — ABNORMAL LOW (ref 150–400)
RBC: 5.06 MIL/uL (ref 4.22–5.81)
RDW: 12.9 % (ref 11.5–15.5)
WBC: 23.3 10*3/uL — AB (ref 4.0–10.5)

## 2013-09-16 LAB — I-STAT TROPONIN, ED: TROPONIN I, POC: 0.01 ng/mL (ref 0.00–0.08)

## 2013-09-16 LAB — GLUCOSE, CAPILLARY
Glucose-Capillary: 178 mg/dL — ABNORMAL HIGH (ref 70–99)
Glucose-Capillary: 260 mg/dL — ABNORMAL HIGH (ref 70–99)

## 2013-09-16 LAB — APTT: aPTT: 35 seconds (ref 24–37)

## 2013-09-16 LAB — PROTIME-INR
INR: 1.19 (ref 0.00–1.49)
Prothrombin Time: 14.8 seconds (ref 11.6–15.2)

## 2013-09-16 MED ORDER — LISINOPRIL 20 MG PO TABS
20.0000 mg | ORAL_TABLET | ORAL | Status: DC
  Filled 2013-09-16: qty 1

## 2013-09-16 MED ORDER — SODIUM CHLORIDE 0.9 % IV SOLN
INTRAVENOUS | Status: DC
  Administered 2013-09-16 – 2013-09-17 (×3): via INTRAVENOUS

## 2013-09-16 MED ORDER — SIMVASTATIN 20 MG PO TABS
20.0000 mg | ORAL_TABLET | Freq: Every day | ORAL | Status: DC
  Administered 2013-09-16 – 2013-09-17 (×2): 20 mg via ORAL
  Filled 2013-09-16 (×3): qty 1

## 2013-09-16 MED ORDER — METFORMIN HCL 500 MG PO TABS
500.0000 mg | ORAL_TABLET | Freq: Every day | ORAL | Status: DC
Start: 2013-09-17 — End: 2013-09-17
  Filled 2013-09-16 (×2): qty 1

## 2013-09-16 MED ORDER — CIPROFLOXACIN IN D5W 400 MG/200ML IV SOLN
400.0000 mg | Freq: Two times a day (BID) | INTRAVENOUS | Status: DC
  Administered 2013-09-16 – 2013-09-18 (×4): 400 mg via INTRAVENOUS
  Filled 2013-09-16 (×5): qty 200

## 2013-09-16 MED ORDER — METOPROLOL TARTRATE 25 MG PO TABS
25.0000 mg | ORAL_TABLET | Freq: Two times a day (BID) | ORAL | Status: DC
  Administered 2013-09-16 – 2013-09-18 (×4): 25 mg via ORAL
  Filled 2013-09-16 (×5): qty 1

## 2013-09-16 MED ORDER — ENOXAPARIN SODIUM 40 MG/0.4ML ~~LOC~~ SOLN
40.0000 mg | SUBCUTANEOUS | Status: DC
  Administered 2013-09-16 – 2013-09-17 (×2): 40 mg via SUBCUTANEOUS
  Filled 2013-09-16 (×3): qty 0.4

## 2013-09-16 MED ORDER — ASPIRIN 325 MG PO TABS
325.0000 mg | ORAL_TABLET | ORAL | Status: DC
  Filled 2013-09-16 (×2): qty 1

## 2013-09-16 MED ORDER — SODIUM CHLORIDE 0.9 % IV BOLUS (SEPSIS)
1000.0000 mL | Freq: Once | INTRAVENOUS | Status: AC
  Administered 2013-09-16: 1000 mL via INTRAVENOUS

## 2013-09-16 MED ORDER — CLOPIDOGREL BISULFATE 75 MG PO TABS
75.0000 mg | ORAL_TABLET | Freq: Every day | ORAL | Status: DC
  Administered 2013-09-17 – 2013-09-18 (×2): 75 mg via ORAL
  Filled 2013-09-16 (×3): qty 1

## 2013-09-16 MED ORDER — SODIUM CHLORIDE 0.9 % IJ SOLN
3.0000 mL | Freq: Two times a day (BID) | INTRAMUSCULAR | Status: DC
  Administered 2013-09-16: 3 mL via INTRAVENOUS

## 2013-09-16 MED ORDER — SODIUM CHLORIDE 0.9 % IV SOLN: INTRAVENOUS | Status: DC

## 2013-09-16 MED ORDER — INSULIN ASPART 100 UNIT/ML ~~LOC~~ SOLN
0.0000 [IU] | Freq: Three times a day (TID) | SUBCUTANEOUS | Status: DC
  Administered 2013-09-16: 2 [IU] via SUBCUTANEOUS
  Administered 2013-09-17 (×2): 3 [IU] via SUBCUTANEOUS
  Administered 2013-09-17 – 2013-09-18 (×2): 2 [IU] via SUBCUTANEOUS
  Administered 2013-09-18: 3 [IU] via SUBCUTANEOUS

## 2013-09-16 MED ORDER — INSULIN GLARGINE 100 UNIT/ML ~~LOC~~ SOLN
35.0000 [IU] | Freq: Every evening | SUBCUTANEOUS | Status: DC
  Administered 2013-09-16 – 2013-09-17 (×2): 35 [IU] via SUBCUTANEOUS
  Filled 2013-09-16 (×4): qty 0.35

## 2013-09-16 MED ORDER — CIPROFLOXACIN IN D5W 400 MG/200ML IV SOLN
400.0000 mg | Freq: Once | INTRAVENOUS | Status: AC
  Administered 2013-09-16: 400 mg via INTRAVENOUS
  Filled 2013-09-16: qty 200

## 2013-09-16 NOTE — ED Notes (Signed)
Pt transported to CT scan.

## 2013-09-16 NOTE — ED Notes (Signed)
Pt states generalized weakness and fatigue. Also states he fell several weeks ago at [employer], now states intermittent neck and back pain, also states bilateral leg weakness. Able to move all extermities, states he occasionally experiences difficulty moving R side of body from previous stroke. He is alert and oriented x4. 3/10 pain at the time.

## 2013-09-16 NOTE — Progress Notes (Signed)
NURSING PROGRESS NOTE  [name] [id number] Admission Data: 09/16/2013 6:52 PM Attending Provider: Caren Griffins, MD NGE:[id number],GMWN, MD Code Status: Full   [name] is a 73 y.o. male patient admitted from ED:  -No acute distress noted.  -No complaints of shortness of breath.  -No complaints of chest pain.   Cardiac Monitoring: Box # 9 in place. Cardiac monitor yields:normal sinus rhythm.  Blood pressure 166/68, pulse 83, temperature 98.3 F (36.8 C), temperature source Oral, resp. rate 20, height 5\' 11"  (1.803 m), weight 95.981 kg (211 lb 9.6 oz), SpO2 98.00%.   IV Fluids:  IV in place, occlusive dsg intact without redness, IV cath hand right, condition patent and no redness normal saline.   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  Past Surgical History:   has no past surgical history on file.  Social History:   reports that he quit smoking about 22 years ago. He quit smokeless tobacco use about 23 years ago. He reports that he does not drink alcohol or use illicit drugs.  Skin: Intact  Patient/Family orientated to room. Information packet given to patient/family. Admission inpatient armband information verified with patient/family to include name and date of birth and placed on patient arm. Side rails up x 2, fall assessment and education completed with patient/family. Patient/family able to verbalize understanding of risk associated with falls and verbalized understanding to call for assistance before getting out of bed. Call light within reach. Patient/family able to voice and demonstrate understanding of unit orientation instructions.    Will continue to evaluate and treat per MD orders.

## 2013-09-16 NOTE — ED Provider Notes (Signed)
CSN: [id number]     Arrival date & time 09/16/13  9211 History   First MD Initiated Contact with Patient 09/16/13 0957     Chief Complaint  Patient presents with  . Weakness  . Urinary Tract Infection     (Consider location/radiation/quality/duration/timing/severity/associated sxs/prior Treatment) HPI Patient presents with concern of generalized weakness. Patient's last normal approximately 11 hours ago at least, prior to going to bed last night. Patient awoke once overnight, felt generally weak, then was able to sleep, but when he awoke again he felt generalized weakness without focal deficit. No focal pain, nausea, lightheadedness, syncope, dyspnea. No clear precipitant, and since onset no clear alleviating or exacerbating factors. Patient does have a history of prior stroke, including persistent right-sided deficits.  Past Medical History  Diagnosis Date  . 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 Hypertension   . Diabetes mellitus   . A-fib   . Cataract   . Charcot-Marie-Tooth disease   . 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 Stroke    History reviewed. No pertinent past surgical history. History reviewed. No pertinent family history. History  Substance Use Topics  . Smoking status: Former Smoker -- 2.00 packs/day for 35 years    Quit date: 06/03/1991  . Smokeless tobacco: Former Systems developer    Quit date: 06/26/1990  . Alcohol Use: No    Review of Systems  Constitutional:       Per HPI, otherwise negative  HENT:       Per HPI, otherwise negative  Respiratory:       Per HPI, otherwise negative  Cardiovascular:       Per HPI, otherwise negative  Gastrointestinal: Negative for vomiting.  Endocrine:       Negative aside from HPI  Genitourinary:       Neg aside from HPI   Musculoskeletal:       Per HPI, otherwise negative  Skin: Negative.   Neurological: Positive for weakness. Negative for syncope.      Allergies  Actos;  Hydrochlorothiazide; and Penicillins  Home Medications   Current Outpatient Rx  Name  Route  Sig  Dispense  Refill  . ACCU-CHEK AVIVA PLUS test strip               . amLODipine (NORVASC) 5 MG tablet               . aspirin 325 MG tablet   Oral   Take 325 mg by mouth 3 (three) times a week. Monday, Wednesday, and Friday.         . clopidogrel (PLAVIX) 75 MG tablet   Oral   Take 75 mg by mouth daily after breakfast.          . insulin glargine (LANTUS) 100 UNIT/ML injection   Subcutaneous   Inject 42 Units into the skin every evening.          [name] lisinopril (PRINIVIL,ZESTRIL) 20 MG tablet   Oral   Take 20 mg by mouth every morning.          . metFORMIN (GLUCOPHAGE) 500 MG tablet               . metoprolol tartrate (LOPRESSOR) 25 MG tablet   Oral   Take 25 mg by mouth 2 (two) times daily.          . Probiotic Product (ALIGN PO)   Oral   Take by mouth daily.         [name]  simvastatin (ZOCOR) 20 MG tablet   Oral   Take 20 mg by mouth at bedtime.            BP 133/47  Temp(Src) 99 F (37.2 C) (Oral)  Resp 23  SpO2 96% Physical Exam  Nursing note and vitals reviewed. Constitutional: He is oriented to person, place, and time. He appears well-developed. No distress.  HENT:  Head: Normocephalic and atraumatic.  Eyes: Conjunctivae and EOM are normal.  Cardiovascular: Normal rate and regular rhythm.   Pulmonary/Chest: Effort normal. No stridor. No respiratory distress.  Abdominal: He exhibits no distension.  Musculoskeletal: He exhibits no edema.  Neurological: He is alert and oriented to person, place, and time.  Speech is clear, this is symmetric, strength is 4/5 in the right upper extremity, 5/5 in the right lower and all left extremities. No Cerebellar deficits  Skin: Skin is warm and dry.  Psychiatric: He has a normal mood and affect. His behavior is normal. Thought content normal.    ED Course  Procedures (including critical care  time) Labs Review Labs Reviewed  CBC - Abnormal; Notable for the following:    WBC 23.3 (*)    Platelets 123 (*)    All other components within normal limits  DIFFERENTIAL - Abnormal; Notable for the following:    Neutrophils Relative % 83 (*)    Lymphocytes Relative 9 (*)    Neutro Abs 19.3 (*)    Monocytes Absolute 1.9 (*)    All other components within normal limits  COMPREHENSIVE METABOLIC PANEL - Abnormal; Notable for the following:    Glucose, Bld 182 (*)    Albumin 3.0 (*)    All other components within normal limits  URINALYSIS, ROUTINE W REFLEX MICROSCOPIC - Abnormal; Notable for the following:    Color, Urine AMBER (*)    APPearance CLOUDY (*)    Hgb urine dipstick LARGE (*)    Ketones, ur 15 (*)    Protein, ur 30 (*)    Nitrite POSITIVE (*)    Leukocytes, UA MODERATE (*)    All other components within normal limits  URINE MICROSCOPIC-ADD ON - Abnormal; Notable for the following:    Bacteria, UA MANY (*)    All other components within normal limits  ETHANOL  PROTIME-INR  APTT  URINE RAPID DRUG SCREEN (HOSP PERFORMED)  I-STAT TROPOININ, ED   Imaging Review Dg Chest 2 View  09/16/2013   CLINICAL DATA:  Bilateral leg weakness an general body weakness.  EXAM: CHEST  2 VIEW  COMPARISON:  CT CHEST W/CM dated 10/07/2012; CT CHEST W/CM dated 10/08/2011  FINDINGS: Normal cardiac and mediastinal contours. Elevation right hemidiaphragm. No consolidative pulmonary opacities. No pleural effusion or pneumothorax. Regional skeleton is unremarkable.  IMPRESSION: No acute cardiopulmonary process.   Electronically Signed   By: Lovey Newcomer M.D.   On: 09/16/2013 11:43   Ct Head Wo Contrast  09/16/2013   CLINICAL DATA:  Generalized weakness.  EXAM: CT HEAD WITHOUT CONTRAST  TECHNIQUE: Contiguous axial images were obtained from the base of the skull through the vertex without contrast.  COMPARISON:  12/02/2008  FINDINGS: Stable low-density in the the white matter, particularly in the left  parietal lobe. No evidence for acute hemorrhage, mass lesion, midline shift, hydrocephalus or large infarct. No acute bone abnormality. Mild mucosal thickening in the ethmoid air cells.  IMPRESSION: No acute intracranial abnormality.  Chronic white matter changes, particularly in the left parietal lobe.   Electronically Signed   By: Quita Skye  Anselm Pancoast M.D.   On: 09/16/2013 11:29     EKG Interpretation   Date/Time:  Saturday September 16 2013 09:58:50 EDT Ventricular Rate:  86 PR Interval:  172 QRS Duration: 105 QT Interval:  359 QTC Calculation: 429 R Axis:   15 Text Interpretation:  Sinus rhythm Sinus rhythm Normal ECG Confirmed by  Carmin Muskrat  MD ([id number]) on 09/16/2013 10:06:06 AM      3:23 PM Patient in no distress, though he remains too weak to ambulate. After a delay in obtaining urine due to the patient's dehydrated state, urinalysis demonstrates infection. With persistent weakness will be treated with antibiotics, admitted for further evaluation and management.    MDM   Elderly male presents with weakness.  Patient also has deficits, though these seem consistent from a prior stroke. On exam patient is awake alert, though weakened throughout and unable to ambulate. Patient's labs notable for leukocytosis, urinary tract infection. Patient sheet anorexia, fluid resuscitation here, was admitted for further evaluation and management.    Carmin Muskrat, MD 09/16/13 1524

## 2013-09-16 NOTE — ED Notes (Signed)
Unable to take patient for CT, using urinal, EMT at bedside

## 2013-09-16 NOTE — ED Notes (Signed)
Pt presents to department from home via [facility] for evaluation of generalized weakness and dysuria. R sided deficit from previous stroke. States he normally has difficulty ambulating. Pt is conscious alert and oriented x4. Moving all extremities. 20g R hand.

## 2013-09-16 NOTE — ED Notes (Signed)
Dr. Vanita Panda made aware RN and NT attempted in and out x3 without success. Bladder scanner sts 17 ml

## 2013-09-16 NOTE — H&P (Signed)
History and Physical    [name]:[id number] DOB: September 04, 1940 DOA: 09/16/2013  Referring physician: Dr. Vanita Panda PCP: Donnie Coffin, MD  Specialists: none  Chief Complaint: Weakness  HPI: [name] is a 73 y.o. male has a past medical history significant for hypertension, hyperlipidemia, insulin-dependent diabetes, history of B-cell lymphoma followed by oncology (in remission), history of atrial fibrillation, prior CVA with very mild right-sided residual weakness, presents to the emergency room with a chief complaint of weakness for the past day. He also endorses subjective fever last night without chills. He has no abdominal pain, no nausea, vomiting or diarrhea. He denies any chest pain or shortness of breath. He has no lightheadedness or dizziness. He endorses having urinary tract infections in the past, and they are usually treated with ciprofloxacin per his PCP. He denies any burning with urination or dysuria, however he has noticed increased frequency overnight last night. He states that his diabetes is fairly well controlled, however his A1c recently went up and his PCP added metformin in addition to his Lantus. He has diabetic neuropathy and just had an ulceration above his left foot that is now healing well. He has had several falls in the past, most recent one about 3 weeks ago and he has residual neck pain from that fall.  In the emergency room, patient with stable vital signs, normal blood pressure, afebrile, not tachycardic. White count of 23 and urinalysis suggesting urinary tract infection.   Review of Systems: As per history of present illness, otherwise negative  Past Medical History  Diagnosis Date  . 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 Hypertension   . Diabetes mellitus   . A-fib   . Cataract   . Charcot-Marie-Tooth disease   . 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 Stroke    History reviewed. No pertinent past  surgical history. Social History:  reports that he quit smoking about 22 years ago. He quit smokeless tobacco use about 23 years ago. He reports that he does not drink alcohol or use illicit drugs.  Allergies  Allergen Reactions  . Actos [Pioglitazone Hydrochloride] Other (See Comments)    Afib  . Hydrochlorothiazide Other (See Comments)    Lower potassium to low  . Penicillins Rash   History reviewed. No pertinent family history.  Prior to Admission medications   Medication Sig Start Date End Date Taking? Authorizing Provider  ACCU-CHEK AVIVA PLUS test strip 1 each by Other route as needed (blod sugar).  05/13/13  Yes Historical Provider, MD  amLODipine (NORVASC) 5 MG tablet Take 5 mg by mouth daily.  05/01/13  Yes Historical Provider, MD  aspirin 325 MG tablet Take 325 mg by mouth 3 (three) times a week. Monday, Wednesday, and Friday.   Yes Historical Provider, MD  clopidogrel (PLAVIX) 75 MG tablet Take 75 mg by mouth daily after breakfast.    Yes Historical Provider, MD  insulin glargine (LANTUS) 100 UNIT/ML injection Inject 42 Units into the skin every evening.    Yes Historical Provider, MD  lisinopril (PRINIVIL,ZESTRIL) 20 MG tablet Take 20 mg by mouth every morning.    Yes Historical Provider, MD  metFORMIN (GLUCOPHAGE) 500 MG tablet Take 500 mg by mouth daily with breakfast.  05/10/13  Yes Historical Provider, MD  metoprolol tartrate (LOPRESSOR) 25 MG tablet Take 25 mg by mouth 2 (two) times daily.  03/28/11  Yes Historical Provider, MD  Probiotic Product (ALIGN PO) Take 1  capsule by mouth daily.    Yes Historical Provider, MD  simvastatin (ZOCOR) 20 MG tablet Take 20 mg by mouth at bedtime.     Yes Historical Provider, MD   Physical Exam: Filed Vitals:   09/16/13 1330 09/16/13 1400 09/16/13 1430 09/16/13 1500  BP: 137/51 150/56 152/57 132/54  Pulse: 76 77 79 83  Temp:      TempSrc:      Resp:      SpO2: 100% 99% 99% 98%    General:  No apparent distress, pleasant  Caucasian male  Eyes: PERRL, EOMI, no scleral icterus  ENT: moist oropharynx  Neck: supple, no JVD  Cardiovascular: regular rate without MRG; 2+ peripheral pulses  Respiratory: CTA biL, good air movement without wheezing, rhonchi or crackled  Abdomen: soft, non tender to palpation, positive bowel sounds, no guarding, no rebound  Skin: no rashes  Musculoskeletal: no peripheral edema; no CVA tenderness  Psychiatric: normal mood and affect  Neurologic: Cranial nerves 2-12 grossly normal, strength 5 out of 5 on the left, 5 minus out of 5 on the right  Labs on Admission:  Basic Metabolic Panel:  Recent Labs Lab 09/16/13 1053  NA 141  K 4.0  CL 101  CO2 25  GLUCOSE 182*  BUN 21  CREATININE 0.72  CALCIUM 9.0   Liver Function Tests:  Recent Labs Lab 09/16/13 1053  AST 13  ALT 12  ALKPHOS 75  BILITOT 0.7  PROT 6.3  ALBUMIN 3.0*   CBC:  Recent Labs Lab 09/16/13 1053  WBC 23.3*  NEUTROABS 19.3*  HGB 15.8  HCT 45.1  MCV 89.1  PLT 123*   Radiological Exams on Admission: Dg Chest 2 View  09/16/2013   CLINICAL DATA:  Bilateral leg weakness an general body weakness.  EXAM: CHEST  2 VIEW  COMPARISON:  CT CHEST W/CM dated 10/07/2012; CT CHEST W/CM dated 10/08/2011  FINDINGS: Normal cardiac and mediastinal contours. Elevation right hemidiaphragm. No consolidative pulmonary opacities. No pleural effusion or pneumothorax. Regional skeleton is unremarkable.  IMPRESSION: No acute cardiopulmonary process.   Electronically Signed   By: Lovey Newcomer M.D.   On: 09/16/2013 11:43   Ct Head Wo Contrast  09/16/2013   CLINICAL DATA:  Generalized weakness.  EXAM: CT HEAD WITHOUT CONTRAST  TECHNIQUE: Contiguous axial images were obtained from the base of the skull through the vertex without contrast.  COMPARISON:  12/02/2008  FINDINGS: Stable low-density in the the white matter, particularly in the left parietal lobe. No evidence for acute hemorrhage, mass lesion, midline shift,  hydrocephalus or large infarct. No acute bone abnormality. Mild mucosal thickening in the ethmoid air cells.  IMPRESSION: No acute intracranial abnormality.  Chronic white matter changes, particularly in the left parietal lobe.   Electronically Signed   By: Markus Daft M.D.   On: 09/16/2013 11:29   EKG: Independently reviewed. Sinus rhythm.  Assessment/Plan Principal Problem:   UTI (lower urinary tract infection) Active Problems:   A-fib   B-cell lymphoma   Diabetes mellitus   Hypertension   Recurrent falls   History of CVA (cerebrovascular accident)   Leukocytosis   Thrombocytopenia    Urinary tract infection - patient has had recurring urinary tract infections. No previous microbiology in the system. Patient is allergic to penicillin, and will start empiric IV ciprofloxacin; urine culture sent, will follow.  DIABETES mellitus - patient tells me that in the past week has seen some low blood sugars at home in the 50s and 60s, asymptomatic.  We'll start his home Lantus at the lower dose and add sliding scale insulin. Continue his metformin. Check A1c. Leukocytosis - due to #1. Repeat CBC in the morning. 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 Recurrent falls - we'll ask PT to evaluate. Obtain C-spine x-ray given neck pain and fall 3 weeks ago. Hypertension - restart his lisinopril and metoprolol. Hold Norvasc for now. History of atrial fibrillation - continue metoprolol. Monitor on telemetry overnight. Discontinue telemetry if no events in 24-48 hours. He currently is in sinus rhythm. Probably not anticoagulated due to his falls. Continue aspirin and Plavix. B-cell lymphoma - followed by oncology as an outpatient Prior CVA - neuro exam appears at baseline without any new focal findings. PT to evaluate.   Diet: Diabetic Fluids: Normal saline 75 cc per hour for 24 hours DVT Prophylaxis: Lovenox  Code  Status: Full code  Family Communication: Son and wife at the bedside  Disposition Plan: Inpatient  Time spent: 36  This note has been created with Surveyor, quantity. Any transcriptional errors are unintentional.   Drew Lips M. Cruzita Lederer, MD [facility] Pager [phone]  If 7PM-7AM, please contact night-coverage [email or web address] Password [facility] 09/16/2013, 4:08 PM

## 2013-09-17 LAB — HEMOGLOBIN A1C
HEMOGLOBIN A1C: 7.5 % — AB (ref <5.7)
HEMOGLOBIN A1C: 7.5 % — AB (ref <5.7)
Mean Plasma Glucose: 169 mg/dL — ABNORMAL HIGH (ref <117)
Mean Plasma Glucose: 169 mg/dL — ABNORMAL HIGH (ref <117)

## 2013-09-17 LAB — BASIC METABOLIC PANEL
BUN: 16 mg/dL (ref 6–23)
CO2: 24 meq/L (ref 19–32)
CREATININE: 0.61 mg/dL (ref 0.50–1.35)
Calcium: 8 mg/dL — ABNORMAL LOW (ref 8.4–10.5)
Chloride: 101 mEq/L (ref 96–112)
GFR calc Af Amer: >90 mL/min (ref >90)
GLUCOSE: 174 mg/dL — AB (ref 70–99)
Potassium: 3.9 mEq/L (ref 3.7–5.3)
SODIUM: 136 meq/L — AB (ref 137–147)

## 2013-09-17 LAB — GLUCOSE, CAPILLARY
Glucose-Capillary: 220 mg/dL — ABNORMAL HIGH (ref 70–99)
Glucose-Capillary: 250 mg/dL — ABNORMAL HIGH (ref 70–99)
Glucose-Capillary: 189 mg/dL — ABNORMAL HIGH (ref 70–99)
Glucose-Capillary: 324 mg/dL — ABNORMAL HIGH (ref 70–99)

## 2013-09-17 LAB — CBC
HCT: 37.9 % — ABNORMAL LOW (ref 39.0–52.0)
Hemoglobin: 13.2 g/dL (ref 13.0–17.0)
MCH: 31.3 pg (ref 26.0–34.0)
MCHC: 34.8 g/dL (ref 30.0–36.0)
MCV: 89.8 fL (ref 78.0–100.0)
PLATELETS: 103 10*3/uL — AB (ref 150–400)
RBC: 4.22 MIL/uL (ref 4.22–5.81)
RDW: 13 % (ref 11.5–15.5)
WBC: 19.6 10*3/uL — ABNORMAL HIGH (ref 4.0–10.5)

## 2013-09-17 MED ORDER — SACCHAROMYCES BOULARDII 250 MG PO CAPS
250.0000 mg | ORAL_CAPSULE | Freq: Two times a day (BID) | ORAL | Status: DC
  Administered 2013-09-17 – 2013-09-18 (×3): 250 mg via ORAL
  Filled 2013-09-17 (×4): qty 1

## 2013-09-17 MED ORDER — LISINOPRIL 20 MG PO TABS
20.0000 mg | ORAL_TABLET | ORAL | Status: DC
  Filled 2013-09-17 (×2): qty 1

## 2013-09-17 MED ORDER — LISINOPRIL 10 MG PO TABS
10.0000 mg | ORAL_TABLET | ORAL | Status: DC
  Administered 2013-09-18: 10 mg via ORAL
  Filled 2013-09-17 (×2): qty 1

## 2013-09-17 MED ORDER — LOPERAMIDE HCL 2 MG PO CAPS
2.0000 mg | ORAL_CAPSULE | ORAL | Status: DC | PRN
Start: 2013-09-17 — End: 2013-09-18
  Administered 2013-09-17: 2 mg via ORAL
  Filled 2013-09-17: qty 1

## 2013-09-17 MED ORDER — SODIUM CHLORIDE 0.9 % IV SOLN
INTRAVENOUS | Status: AC
  Administered 2013-09-17 – 2013-09-18 (×2): via INTRAVENOUS

## 2013-09-17 NOTE — Evaluation (Signed)
Physical Therapy Evaluation Patient Details Name: [name] MRN: [id number] DOB: [birth year] Today's Date: 09/17/2013   History of Present Illness  Pt admitted with UTI.  Pt with PMH of CVA.  Clinical Impression  Pt admitted with UTI. Pt currently with functional limitations due to the deficits listed below (see PT Problem List).  Pt will benefit from skilled PT to increase their independence and safety with mobility to allow discharge to the venue listed below.       Follow Up Recommendations No PT follow up    Equipment Recommendations  None recommended by PT    Recommendations for Other Services       Precautions / Restrictions Precautions Precautions: Fall      Mobility  Bed Mobility Overal bed mobility: Modified Independent                Transfers Overall transfer level: Needs assistance Equipment used: Rolling walker (2 wheeled) Transfers: Sit to/from [facility] Sit to Stand: Min guard Stand pivot transfers: Min guard       General transfer comment: verbal cues for hand placement  Ambulation/Gait Ambulation/Gait assistance: Min guard Ambulation Distance (Feet): 150 Feet Assistive device: Rolling walker (2 wheeled) Gait Pattern/deviations: WFL(Within Functional Limits)   Gait velocity interpretation: Below normal speed for age/gender    Stairs            Wheelchair Mobility    Modified Rankin (Stroke Patients Only)       Balance                                     Pertinent Vitals/Pain 0/10    Home Living Family/patient expects to be discharged to:: Private residence Living Arrangements: Spouse/significant other Available Help at Discharge: Family;Available 24 hours/day Type of Home: House Home Access: Ramped entrance     Home Layout: One level Home Equipment: Walker - 2 wheels;Cane - single point      Prior Function Level of Independence: Independent               Hand  Dominance        Extremity/Trunk Assessment   Upper Extremity Assessment: Overall WFL for tasks assessed           Lower Extremity Assessment: Overall WFL for tasks assessed         Communication   Communication: No difficulties  Cognition Arousal/Alertness: Awake/alert Behavior During Therapy: WFL for tasks assessed/performed Overall Cognitive Status: Within Functional Limits for tasks assessed                      General Comments      Exercises        Assessment/Plan    PT Assessment Patient needs continued PT services  PT Diagnosis Difficulty walking;Generalized weakness   PT Problem List Decreased activity tolerance;Decreased balance;Decreased mobility;Decreased safety awareness  PT Treatment Interventions DME instruction;Gait training;Stair training;Functional mobility training;Therapeutic activities;Therapeutic exercise;Balance training;Patient/family education   PT Goals (Current goals can be found in the Care Plan section) Acute Rehab PT Goals Patient Stated Goal: home PT Goal Formulation: With patient Time For Goal Achievement: 09/24/13 Potential to Achieve Goals: Good    Frequency Min 3X/week   Barriers to discharge        End of Session Equipment Utilized During Treatment: Gait belt Activity Tolerance: Patient tolerated treatment well Patient left: in chair;with call bell/phone within reach;with  family/visitor present    Functional Assessment Tool Used: clinical judgement Functional Limitation: Mobility: Walking and moving around Mobility: Walking and Moving Around Current Status [phone]): At least 1 percent but less than 20 percent impaired, limited or restricted Mobility: Walking and Moving Around Goal Status [phone]): 0 percent impaired, limited or restricted    Time: 1115-1139 PT Time Calculation (min): 24 min   Charges:   PT Evaluation $Initial PT Evaluation Tier I: 1 Procedure PT Treatments $Gait Training: 8-22 mins   PT  G Codes:   Functional Assessment Tool Used: clinical judgement Functional Limitation: Mobility: Walking and moving around    Lorriane Shire 09/17/2013, 1:59 PM  Lorrin Goodell, PT  Office # [phone] Pager [phone]

## 2013-09-17 NOTE — Progress Notes (Signed)
Patient Demographics  [name], is a 73 y.o. male, DOB - Apr 14, 1941, [id number]  Admit date - 09/16/2013   Admitting Physician Costin Karlyne Greenspan, MD  Outpatient Primary MD for the patient is Donnie Coffin, MD  LOS - 1   Chief Complaint  Patient presents with  . Weakness  . Urinary Tract Infection        Assessment & Plan    Urinary tract infection - patient has had recurring urinary tract infections. No previous microbiology in the system. Patient is allergic to penicillin, and will start empiric IV ciprofloxacin; urine culture sent, will follow. Might be BPH related, will have him follow with urology outpatient once discharged.   DIABETES mellitus - continue him on home Lantus at the lower dose and add sliding scale insulin. Metformin on hold.  Lab Results  Component Value Date   HGBA1C 7.5* 09/16/2013    CBG (last 3)   Recent Labs  09/16/13 1709 09/16/13 2105 09/17/13 0807  GLUCAP 178* 260* 220*      Leukocytosis - due to #1. Improving with supportive care he did    Thrombocytopenia - this is chronic, has been noticed by oncology and his outpatient visits, is stable, no evidence of bleeding.   Lab Results  Component Value Date   PLT 103* 09/17/2013      Recurrent falls - we'll ask PT to evaluate. If CT and C-spine x-ray are stable. No focal deficits on exam. Could be due to #1 above.     Hypertension - on beta blocker which will continue, continue lisinopril with holding parameters, hold Norvasc     History of atrial fibrillation - continue metoprolol. Monitor on telemetry overnight. Discontinue telemetry if no events in 24-48 hours. He currently is in sinus rhythm. Not anticoagulated due to his falls. Continue aspirin and Plavix.      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 PT to evaluate.      Code Status: Full  Family Communication:     Disposition Plan: Home   Procedures CT head   Consults    Medications  Scheduled Meds: . [START ON 09/18/2013] aspirin  325 mg Oral Once per day on Mon Wed Fri  . ciprofloxacin  400 mg Intravenous Q12H  . clopidogrel  75 mg Oral QPC breakfast  . enoxaparin (LOVENOX) injection  40 mg Subcutaneous Q24H  . insulin aspart  0-9 Units Subcutaneous TID WC  . insulin glargine  35 Units Subcutaneous QPM  . lisinopril  20 mg Oral BH-[id number]  . metoprolol tartrate  25 mg Oral BID  . simvastatin  20 mg Oral QHS  . sodium chloride  3 mL Intravenous Q12H   Continuous Infusions: . sodium chloride 75 mL/hr at 09/17/13 0838   PRN Meds:.  DVT Prophylaxis  Lovenox    Lab Results  Component Value Date   PLT 103* 09/17/2013    Antibiotics     Anti-infectives   Start     Dose/Rate Route Frequency Ordered Stop   09/17/13 0000  ciprofloxacin (CIPRO) IVPB 400 mg     400 mg 200 mL/hr over 60 Minutes Intravenous Every  12 hours 09/16/13 1615     09/16/13 1530  ciprofloxacin (CIPRO) IVPB 400 mg     400 mg 200 mL/hr over 60 Minutes Intravenous  Once 09/16/13 1522 09/16/13 1645          Subjective:   [name] today has, No headache, No chest pain, No abdominal pain - No Nausea, No new weakness tingling or numbness, No Cough - SOB.    Objective:   Filed Vitals:   09/16/13 1600 09/16/13 1658 09/16/13 2046 09/17/13 0509  BP: 141/47 166/68 156/58 104/57  Pulse: 82 83 85 66  Temp:  98.3 F (36.8 C) 97.5 F (36.4 C) 98.1 F (36.7 C)  TempSrc:  Oral Oral Oral  Resp:  20 18 18   Height:  5\' 11"  (1.803 m)    Weight:  95.981 kg (211 lb 9.6 oz)    SpO2: 97% 98% 99% 97%    Wt Readings from Last 3 Encounters:  09/16/13 95.981 kg (211 lb 9.6 oz)  05/26/13 97.523 kg (215 lb)  04/19/13 98.612  kg (217 lb 6.4 oz)     Intake/Output Summary (Last 24 hours) at 09/17/13 0847 Last data filed at 09/17/13 0841  Gross per 24 hour  Intake    120 ml  Output    375 ml  Net   -255 ml     Physical Exam  Awake Alert, Oriented X 3, No new F.N deficits, Normal affect Jet.AT,PERRAL Supple Neck,No JVD, No cervical lymphadenopathy appriciated.  Symmetrical Chest wall movement, Good air movement bilaterally, CTAB RRR,No Gallops,Rubs or new Murmurs, No Parasternal Heave +ve B.Sounds, Abd Soft, Non tender, No organomegaly appriciated, No rebound - guarding or rigidity. No Cyanosis, Clubbing or edema, No new Rash or bruise      Data Review   Micro Results No results found for this or any previous visit (from the past 240 hour(s)).  Radiology Reports Dg Chest 2 View  09/16/2013   CLINICAL DATA:  Bilateral leg weakness an general body weakness.  EXAM: CHEST  2 VIEW  COMPARISON:  CT CHEST W/CM dated 10/07/2012; CT CHEST W/CM dated 10/08/2011  FINDINGS: Normal cardiac and mediastinal contours. Elevation right hemidiaphragm. No consolidative pulmonary opacities. No pleural effusion or pneumothorax. Regional skeleton is unremarkable.  IMPRESSION: No acute cardiopulmonary process.   Electronically Signed   By: Lovey Newcomer M.D.   On: 09/16/2013 11:43   Dg Cervical Spine 2 Or 3 Views  09/16/2013   CLINICAL DATA:  Fall 3 weeks ago.  Neck pain.  EXAM: CERVICAL SPINE - 2-3 VIEW  COMPARISON:  None.  FINDINGS: No fracture. No spondylolisthesis. There are disc degenerative changes with endplate spurring at X4-J2. Remaining disc spaces are well preserved. Soft tissues are unremarkable other than mild right-sided carotid calcifications.  IMPRESSION: No fracture or acute finding.   Electronically Signed   By: Lajean Manes M.D.   On: 09/16/2013 19:02   Ct Head Wo Contrast  09/16/2013   CLINICAL DATA:  Generalized weakness.  EXAM: CT HEAD WITHOUT CONTRAST  TECHNIQUE: Contiguous axial images were obtained from the  base of the skull through the vertex without contrast.  COMPARISON:  12/02/2008  FINDINGS: Stable low-density in the the white matter, particularly in the left parietal lobe. No evidence for acute hemorrhage, mass lesion, midline shift, hydrocephalus or large infarct. No acute bone abnormality. Mild mucosal thickening in the ethmoid air cells.  IMPRESSION: No acute intracranial abnormality.  Chronic white matter changes, particularly in the left parietal lobe.  Electronically Signed   By: Markus Daft M.D.   On: 09/16/2013 11:29    CBC  Recent Labs Lab 09/16/13 1053 09/17/13 0635  WBC 23.3* 19.6*  HGB 15.8 13.2  HCT 45.1 37.9*  PLT 123* 103*  MCV 89.1 89.8  MCH 31.2 31.3  MCHC 35.0 34.8  RDW 12.9 13.0  LYMPHSABS 2.1  --   MONOABS 1.9*  --   EOSABS 0.0  --   BASOSABS 0.0  --     Chemistries   Recent Labs Lab 09/16/13 1053 09/17/13 0635  NA 141 136*  K 4.0 3.9  CL 101 101  CO2 25 24  GLUCOSE 182* 174*  BUN 21 16  CREATININE 0.72 0.61  CALCIUM 9.0 8.0*  AST 13  --   ALT 12  --   ALKPHOS 75  --   BILITOT 0.7  --    ------------------------------------------------------------------------------------------------------------------ estimated creatinine clearance is 98.7 ml/min (by C-G formula based on Cr of 0.61). ------------------------------------------------------------------------------------------------------------------  Recent Labs  09/16/13 1053  HGBA1C 7.5*   ------------------------------------------------------------------------------------------------------------------ No results found for this basename: CHOL, HDL, LDLCALC, TRIG, CHOLHDL, LDLDIRECT,  in the last 72 hours ------------------------------------------------------------------------------------------------------------------ No results found for this basename: TSH, T4TOTAL, FREET3, T3FREE, THYROIDAB,  in the last 72  hours ------------------------------------------------------------------------------------------------------------------ No results found for this basename: VITAMINB12, FOLATE, FERRITIN, TIBC, IRON, RETICCTPCT,  in the last 72 hours  Coagulation profile  Recent Labs Lab 09/16/13 1053  INR 1.19    No results found for this basename: DDIMER,  in the last 72 hours  Cardiac Enzymes No results found for this basename: CK, CKMB, TROPONINI, MYOGLOBIN,  in the last 168 hours ------------------------------------------------------------------------------------------------------------------ No components found with this basename: POCBNP,      Time Spent in minutes  35   Shadiamond Koska K M.D on 09/17/2013 at 8:47 AM  Between 7am to 7pm - Pager - [phone]  After 7pm go to [email or web address] - password [email or web address]  And look for the night coverage person covering for me after hours  [facility] Office  [phone]

## 2013-09-18 LAB — CBC
HEMATOCRIT: 37.3 % — AB (ref 39.0–52.0)
HEMOGLOBIN: 12.9 g/dL — AB (ref 13.0–17.0)
MCH: 30.7 pg (ref 26.0–34.0)
MCHC: 34.6 g/dL (ref 30.0–36.0)
MCV: 88.8 fL (ref 78.0–100.0)
Platelets: 101 10*3/uL — ABNORMAL LOW (ref 150–400)
RBC: 4.2 MIL/uL — AB (ref 4.22–5.81)
RDW: 12.6 % (ref 11.5–15.5)
WBC: 8.8 10*3/uL (ref 4.0–10.5)

## 2013-09-18 LAB — GLUCOSE, CAPILLARY
GLUCOSE-CAPILLARY: 231 mg/dL — AB (ref 70–99)
Glucose-Capillary: 183 mg/dL — ABNORMAL HIGH (ref 70–99)

## 2013-09-18 MED ORDER — CIPROFLOXACIN HCL 500 MG PO TABS: 500.0000 mg | ORAL_TABLET | Freq: Two times a day (BID) | ORAL | Status: DC

## 2013-09-18 MED ORDER — SACCHAROMYCES BOULARDII 250 MG PO CAPS: 250.0000 mg | ORAL_CAPSULE | Freq: Two times a day (BID) | ORAL | Status: DC

## 2013-09-18 NOTE — Discharge Instructions (Signed)
Follow with Primary MD Donnie Coffin, MD in 3 days , follow final urine culture results  Get CBC, CMP, checked 3 days by Primary MD and again as instructed by your Primary MD. .   Activity: As tolerated with Full fall precautions use walker/cane & assistance as needed   Disposition Home    Diet: Heart Healthy - Low Carb  For Heart failure patients - Check your Weight same time everyday, if you gain over 2 pounds, or you develop in leg swelling, experience more shortness of breath or chest pain, call your Primary MD immediately. Follow Cardiac Low Salt Diet and 1.8 lit/day fluid restriction.   On your next visit with her primary care physician please Get Medicines reviewed and adjusted.  Please request your Prim.MD to go over all Hospital Tests and Procedure/Radiological results at the follow up, please get all Hospital records sent to your Prim MD by signing hospital release before you go home.   If you experience worsening of your admission symptoms, develop shortness of breath, life threatening emergency, suicidal or homicidal thoughts you must seek medical attention immediately by calling 911 or calling your MD immediately  if symptoms less severe.  You Must read complete instructions/literature along with all the possible adverse reactions/side effects for all the Medicines you take and that have been prescribed to you. Take any new Medicines after you have completely understood and accpet all the possible adverse reactions/side effects.   Do not drive and provide baby sitting services if your were admitted for syncope or siezures until you have seen by Primary MD or a Neurologist and advised to do so again.  Do not drive when taking Pain medications.    Do not take more than prescribed Pain, Sleep and Anxiety Medications  Special Instructions: If you have smoked or chewed Tobacco  in the last 2 yrs please stop smoking, stop any regular Alcohol  and or any Recreational drug  use.  Wear Seat belts while driving.   Please note  You were cared for by a hospitalist during your hospital stay. If you have any questions about your discharge medications or the care you received while you were in the hospital after you are discharged, you can call the unit and asked to speak with the hospitalist on call if the hospitalist that took care of you is not available. Once you are discharged, your primary care physician will handle any further medical issues. Please note that NO REFILLS for any discharge medications will be authorized once you are discharged, as it is imperative that you return to your primary care physician (or establish a relationship with a primary care physician if you do not have one) for your aftercare needs so that they can reassess your need for medications and monitor your lab values.

## 2013-09-18 NOTE — Discharge Summary (Signed)
[name], is a 73 y.o. male  DOB July 23, 1940  MRN [id number].  Admission date:  09/16/2013  Admitting Physician  Caren Griffins, MD  Discharge Date:  09/18/2013   Primary MD  Donnie Coffin, MD  Recommendations for primary care physician for things to follow:   Please follow final urine culture results  Have patient follow with his primary urologist for recurrent UTIs    Admission Diagnosis  UTI (lower urinary tract infection) [599.0]   Discharge Diagnosis  UTI (lower urinary tract infection) [599.0]     Principal Problem:   UTI (lower urinary tract infection) Active Problems:   A-fib   B-cell lymphoma   Diabetes mellitus   Hypertension   Recurrent falls   History of CVA (cerebrovascular accident)   Leukocytosis   Thrombocytopenia      Past Medical History  Diagnosis Date  . 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 Hypertension   . Diabetes mellitus   . A-fib   . Cataract   . Charcot-Marie-Tooth disease   . 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 Stroke     History reviewed. No pertinent past surgical history.   Discharge Condition: Stable   Follow UP  Follow-up Information   Follow up with [facility], MD. Schedule an appointment as soon as possible for a visit in 3 days.   Specialty:  Family Medicine   Contact information:   [address] [place] [address] [phone]       Follow up with Malka So, MD. Schedule an appointment as soon as possible for a visit in 1 week.   Specialty:  Urology   Contact information:   [facility]  [place] [address] [phone]         Discharge Instructions  and  Discharge Medications          Discharge Orders   Future Appointments Provider  Department Dept Phone   10/18/2013 2:15 PM [facility] [phone]   10/18/2013 2:45 PM Heath Lark, MD [facility] [phone]   12/29/2013 10:15 AM Harriet Masson, [place] [facility] [place]   Future Orders Complete By Expires   Diet - low sodium heart healthy  As directed    Discharge instructions  As directed    Comments:     Follow with Primary MD Donnie Coffin, MD in 3 days. Follow final urine culture results    Get CBC, CMP, checked 3 days by Primary MD and again as instructed by your Primary MD. .   Activity: As tolerated with Full fall precautions use walker/cane & assistance as needed   Disposition Home    Diet: Heart Healthy - Low Carb  For Heart failure patients - Check your Weight same time everyday, if you gain over 2 pounds, or you develop in leg swelling, experience more shortness of breath or chest pain,  call your Primary MD immediately. Follow Cardiac Low Salt Diet and 1.8 lit/day fluid restriction.   On your next visit with her primary care physician please Get Medicines reviewed and adjusted.  Please request your Prim.MD to go over all Hospital Tests and Procedure/Radiological results at the follow up, please get all Hospital records sent to your Prim MD by signing hospital release before you go home.   If you experience worsening of your admission symptoms, develop shortness of breath, life threatening emergency, suicidal or homicidal thoughts you must seek medical attention immediately by calling 911 or calling your MD immediately  if symptoms less severe.  You Must read complete instructions/literature along with all the possible adverse reactions/side effects for all the Medicines you take and that have been prescribed to you. Take any new Medicines after you have completely understood and accpet all the possible adverse reactions/side effects.   Do not drive  and provide baby sitting services if your were admitted for syncope or siezures until you have seen by Primary MD or a Neurologist and advised to do so again.  Do not drive when taking Pain medications.    Do not take more than prescribed Pain, Sleep and Anxiety Medications  Special Instructions: If you have smoked or chewed Tobacco  in the last 2 yrs please stop smoking, stop any regular Alcohol  and or any Recreational drug use.  Wear Seat belts while driving.   Please note  You were cared for by a hospitalist during your hospital stay. If you have any questions about your discharge medications or the care you received while you were in the hospital after you are discharged, you can call the unit and asked to speak with the hospitalist on call if the hospitalist that took care of you is not available. Once you are discharged, your primary care physician will handle any further medical issues. Please note that NO REFILLS for any discharge medications will be authorized once you are discharged, as it is imperative that you return to your primary care physician (or establish a relationship with a primary care physician if you do not have one) for your aftercare needs so that they can reassess your need for medications and monitor your lab values.   Increase activity slowly  As directed        Medication List         ACCU-CHEK AVIVA PLUS test strip  Generic drug:  glucose blood  1 each by Other route as needed (blod sugar).     ALIGN PO  Take 1 capsule by mouth daily.     amLODipine 5 MG tablet  Commonly known as:  NORVASC  Take 5 mg by mouth daily.     aspirin 325 MG tablet  Take 325 mg by mouth 3 (three) times a week. Monday, Wednesday, and Friday.     ciprofloxacin 500 MG tablet  Commonly known as:  CIPRO  Take 1 tablet (500 mg total) by mouth 2 (two) times daily.     clopidogrel 75 MG tablet  Commonly known as:  PLAVIX  Take 75 mg by mouth daily after breakfast.      insulin glargine 100 UNIT/ML injection  Commonly known as:  LANTUS  Inject 42 Units into the skin every evening.     lisinopril 20 MG tablet  Commonly known as:  PRINIVIL,ZESTRIL  Take 20 mg by mouth every morning.     metFORMIN 500 MG tablet  Commonly known as:  GLUCOPHAGE  Take 500 mg by mouth daily with breakfast.     metoprolol tartrate 25 MG tablet  Commonly known as:  LOPRESSOR  Take 25 mg by mouth 2 (two) times daily.     saccharomyces boulardii 250 MG capsule  Commonly known as:  FLORASTOR  Take 1 capsule (250 mg total) by mouth 2 (two) times daily.     simvastatin 20 MG tablet  Commonly known as:  ZOCOR  Take 20 mg by mouth at bedtime.          Diet and Activity recommendation: See Discharge Instructions above   Consults obtained -     Major procedures and Radiology Reports - PLEASE review detailed and final reports for all details, in brief -       Dg Chest 2 View  09/16/2013   CLINICAL DATA:  Bilateral leg weakness an general body weakness.  EXAM: CHEST  2 VIEW  COMPARISON:  CT CHEST W/CM dated 10/07/2012; CT CHEST W/CM dated 10/08/2011  FINDINGS: Normal cardiac and mediastinal contours. Elevation right hemidiaphragm. No consolidative pulmonary opacities. No pleural effusion or pneumothorax. Regional skeleton is unremarkable.  IMPRESSION: No acute cardiopulmonary process.   Electronically Signed   By: Lovey Newcomer M.D.   On: 09/16/2013 11:43   Dg Cervical Spine 2 Or 3 Views  09/16/2013   CLINICAL DATA:  Fall 3 weeks ago.  Neck pain.  EXAM: CERVICAL SPINE - 2-3 VIEW  COMPARISON:  None.  FINDINGS: No fracture. No spondylolisthesis. There are disc degenerative changes with endplate spurring at X33443. Remaining disc spaces are well preserved. Soft tissues are unremarkable other than mild right-sided carotid calcifications.  IMPRESSION: No fracture or acute finding.   Electronically Signed   By: Lajean Manes M.D.   On: 09/16/2013 19:02   Ct Head Wo  Contrast  09/16/2013   CLINICAL DATA:  Generalized weakness.  EXAM: CT HEAD WITHOUT CONTRAST  TECHNIQUE: Contiguous axial images were obtained from the base of the skull through the vertex without contrast.  COMPARISON:  12/02/2008  FINDINGS: Stable low-density in the the white matter, particularly in the left parietal lobe. No evidence for acute hemorrhage, mass lesion, midline shift, hydrocephalus or large infarct. No acute bone abnormality. Mild mucosal thickening in the ethmoid air cells.  IMPRESSION: No acute intracranial abnormality.  Chronic white matter changes, particularly in the left parietal lobe.   Electronically Signed   By: Markus Daft M.D.   On: 09/16/2013 11:29    Micro Results      Recent Results (from the past 240 hour(s))  URINE CULTURE     Status: None   Collection Time    09/16/13  2:38 PM      Result Value Ref Range Status   Specimen Description URINE, CATHETERIZED   Final   Special Requests ADD [id number]   Final   Culture  Setup Time     Final   Value: 09/17/2013 01:41     Performed at [facility]     Final   Value: >=100,000 COLONIES/ML     Performed at [facility]   Culture     Final   Value: ESCHERICHIA COLI     Performed at [facility]   Report Status PENDING   Incomplete     History of present illness and  Hospital Course:     Kindly see H&P for history of present illness and admission details, please review complete Labs, Consult reports and Test  reports for all details in brief [name], is a 73 y.o. male, patient with history of  hypertension, hyperlipidemia, insulin-dependent diabetes, history of B-cell lymphoma followed by oncology (in remission), history of atrial fibrillation, prior CVA with very mild right-sided residual weakness, presents to the emergency room with a chief complaint of weakness for the past day, workup was consistent with early sepsis with UTI.     Urinary tract infection -  patient has had recurring urinary tract infections. Of a growing Escherichia coli and clinically has responded well to IV Cipro which was used empirically, fever leukocytosis has resolved, he is symptom free, will place on 5 more days of oral Cipro with outpatient followup with PCP and his urologist. Burnis Medin request PCP to kindly follow final urine culture results.      DIABETES mellitus - continue home regimen, will have PCP adjust Meds as needed   Lab Results   Component  Value  Date    HGBA1C  7.5*  09/16/2013    CBG (last 3)   CBG (last 3)   Recent Labs  09/17/13 1808 09/17/13 2103 09/18/13 0750  GLUCAP 189* 324* 183*       Leukocytosis - due to #1. 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  Lab Results   Component  Value  Date    PLT  103*  09/17/2013       Recurrent falls - cleared by PT, head CT and C-spine x-ray stable, advised on fall precautions.    Hypertension - continue home medications, blood pressure is currently stable   History of atrial fibrillation - continue metoprolol. Monitor on telemetry overnight. Discontinue telemetry if no events in 24-48 hours. He currently is in sinus rhythm. Not anticoagulated due to his falls. Continue aspirin and Plavix.     B-cell lymphoma - followed by oncology as an outpatient     Prior CVA - neuro exam appears at baseline without any new focal findings. PT value needed and stable. Outpatient followup with PCP for risk factor modulation.       Today   Subjective:   Bostin Vitiello today has no headache,no chest abdominal pain,no new weakness tingling or numbness, feels much better wants to go home today.   Objective:   Blood pressure 159/73, pulse 70, temperature 98.7 F (37.1 C), temperature source Oral, resp. rate 19, height 5\' 11"  (1.803 m), weight 95.981 kg (211 lb 9.6 oz), SpO2 93.00%.   Intake/Output  Summary (Last 24 hours) at 09/18/13 0909 Last data filed at 09/18/13 0505  Gross per 24 hour  Intake    120 ml  Output    650 ml  Net   -530 ml    Exam Awake Alert, Oriented *3, No new F.N deficits, Normal affect [place].AT,PERRAL Supple Neck,No JVD, No cervical lymphadenopathy appriciated.  Symmetrical Chest wall movement, Good air movement bilaterally, CTAB RRR,No Gallops,Rubs or new Murmurs, No Parasternal Heave +ve B.Sounds, Abd Soft, Non tender, No organomegaly appriciated, No rebound -guarding or rigidity. No Cyanosis, Clubbing or edema, No new Rash or bruise  Data Review   CBC w Diff: Lab Results  Component Value Date   WBC 8.8 09/18/2013   WBC 10.3 04/19/2013   HGB 12.9* 09/18/2013   HGB 15.4 04/19/2013   HCT 37.3* 09/18/2013   HCT 45.9 04/19/2013   PLT 101* 09/18/2013   PLT 122* 04/19/2013   LYMPHOPCT 9* 09/16/2013  LYMPHOPCT 12.3* 04/19/2013   MONOPCT 8 09/16/2013   MONOPCT 10.3 04/19/2013   EOSPCT 0 09/16/2013   EOSPCT 4.4 04/19/2013   BASOPCT 0 09/16/2013   BASOPCT 0.7 04/19/2013    CMP: Lab Results  Component Value Date   NA 136* 09/17/2013   NA 141 04/19/2013   NA 141 10/08/2011   K 3.9 09/17/2013   K 4.3 04/19/2013   K 4.4 10/08/2011   CL 101 09/17/2013   CL 101 10/07/2012   CL 103 10/08/2011   CO2 24 09/17/2013   CO2 24 04/19/2013   CO2 27 10/08/2011   BUN 16 09/17/2013   BUN 14.5 04/19/2013   BUN 10 10/08/2011   CREATININE 0.61 09/17/2013   CREATININE 0.8 04/19/2013   CREATININE 0.9 10/08/2011   PROT 6.3 09/16/2013   PROT 6.0* 04/19/2013   PROT 6.8 10/08/2011   ALBUMIN 3.0* 09/16/2013   ALBUMIN 2.9* 04/19/2013   BILITOT 0.7 09/16/2013   BILITOT 0.34 04/19/2013   BILITOT 0.80 10/08/2011   ALKPHOS 75 09/16/2013   ALKPHOS 68 04/19/2013   ALKPHOS 71 10/08/2011   AST 13 09/16/2013   AST 16 04/19/2013   AST 21 10/08/2011   ALT 12 09/16/2013   ALT 18 04/19/2013   ALT 30 10/08/2011  .   Total Time in preparing paper work, data evaluation and todays exam - 35  minutes  Thurnell Lose M.D on 09/18/2013 at 9:09 AM  [place]  [phone]

## 2013-09-18 NOTE — Progress Notes (Signed)
Nsg Discharge Note  Admit Date:  09/16/2013 Discharge date: 09/18/2013   [name] to be D/C'd Home per MD order.  AVS completed.  Copy for chart, and copy for patient signed, and dated. Patient/caregiver able to verbalize understanding.  Discharge Medication:   Medication List         ACCU-CHEK AVIVA PLUS test strip  Generic drug:  glucose blood  1 each by Other route as needed (blod sugar).     ALIGN PO  Take 1 capsule by mouth daily.     amLODipine 5 MG tablet  Commonly known as:  NORVASC  Take 5 mg by mouth daily.     aspirin 325 MG tablet  Take 325 mg by mouth 3 (three) times a week. Monday, Wednesday, and Friday.     ciprofloxacin 500 MG tablet  Commonly known as:  CIPRO  Take 1 tablet (500 mg total) by mouth 2 (two) times daily.     clopidogrel 75 MG tablet  Commonly known as:  PLAVIX  Take 75 mg by mouth daily after breakfast.     insulin glargine 100 UNIT/ML injection  Commonly known as:  LANTUS  Inject 42 Units into the skin every evening.     lisinopril 20 MG tablet  Commonly known as:  PRINIVIL,ZESTRIL  Take 20 mg by mouth every morning.     metFORMIN 500 MG tablet  Commonly known as:  GLUCOPHAGE  Take 500 mg by mouth daily with breakfast.     metoprolol tartrate 25 MG tablet  Commonly known as:  LOPRESSOR  Take 25 mg by mouth 2 (two) times daily.     saccharomyces boulardii 250 MG capsule  Commonly known as:  FLORASTOR  Take 1 capsule (250 mg total) by mouth 2 (two) times daily.     simvastatin 20 MG tablet  Commonly known as:  ZOCOR  Take 20 mg by mouth at bedtime.        Discharge Assessment: Filed Vitals:   09/18/13 1006  BP: 170/69  Pulse: 83  Temp:   Resp:    Skin clean, dry and intact without evidence of skin break down, no evidence of skin tears noted. IV catheter discontinued intact. Site without signs and symptoms of complications - no redness or edema noted at insertion site, patient denies c/o pain - only slight  tenderness at site.  Dressing with slight pressure applied.  D/c Instructions-Education: Discharge instructions given to patient/family with verbalized understanding. D/c education completed with patient/family including follow up instructions, medication list, d/c activities limitations if indicated, with other d/c instructions as indicated by MD - patient able to verbalize understanding, all questions fully answered. Patient instructed to return to ED, call 911, or call MD for any changes in condition.  Patient escorted via [place], and D/C home via private auto.  Tinya Cadogan, Elissa Hefty, RN 09/18/2013 2:20 PM

## 2013-09-18 NOTE — Care Management Note (Signed)
    Page 1 of 1   09/18/2013     11:37:37 AM   CARE MANAGEMENT NOTE 09/18/2013  Patient:  [name], [name]   Account Number:  [id number]  Date Initiated:  09/18/2013  Documentation initiated by:  Tomi Bamberger  Subjective/Objective Assessment:   dx uti  admit- lives with spouse.     Action/Plan:   pt eval- no pt f/u needes.   Anticipated DC Date:  09/18/2013   Anticipated DC Plan:  HOME/SELF CARE         Choice offered to / List presented to:             Status of service:  Completed, signed off Medicare Important Message given?   (If response is "NO", the following Medicare IM given date fields will be blank) Date Medicare IM given:   Date Additional Medicare IM given:    Discharge Disposition:  HOME/SELF CARE  Per UR Regulation:  Reviewed for med. necessity/level of care/duration of stay  If discussed at [place] of Stay Meetings, dates discussed:    Comments:  09/18/13 [place], BSN [phone] patient is for dc today, per physical therapy no pt f/u needed. No NCM referral, no needs anticipated.

## 2013-09-19 LAB — URINE CULTURE: Colony Count: >=100000

## 2013-10-18 ENCOUNTER — Ambulatory Visit (HOSPITAL_BASED_OUTPATIENT_CLINIC_OR_DEPARTMENT_OTHER): Payer: Medicare Other | Admitting: Hematology and Oncology

## 2013-10-18 ENCOUNTER — Other Ambulatory Visit (HOSPITAL_BASED_OUTPATIENT_CLINIC_OR_DEPARTMENT_OTHER): Payer: Medicare Other

## 2013-10-18 ENCOUNTER — Encounter: Payer: Self-pay | Admitting: Hematology and Oncology

## 2013-10-18 VITALS — BP 157/76 | HR 75 | Temp 98.0°F | Resp 18 | Ht 71.0 in | Wt 216.3 lb

## 2013-10-18 DIAGNOSIS — I10 Essential (primary) hypertension: Secondary | ICD-10-CM

## 2013-10-18 DIAGNOSIS — Z8673 Personal history of transient ischemic attack (TIA), and cerebral infarction without residual deficits: Secondary | ICD-10-CM

## 2013-10-18 DIAGNOSIS — R233 Spontaneous ecchymoses: Secondary | ICD-10-CM

## 2013-10-18 DIAGNOSIS — C8581 Other specified types of non-Hodgkin lymphoma, lymph nodes of head, face, and neck: Secondary | ICD-10-CM

## 2013-10-18 DIAGNOSIS — C8510 Unspecified B-cell lymphoma, unspecified site: Secondary | ICD-10-CM

## 2013-10-18 LAB — COMPREHENSIVE METABOLIC PANEL (CC13)
ALBUMIN: 3.2 g/dL — AB (ref 3.5–5.0)
ALT: 11 U/L (ref 0–55)
ANION GAP: 10 meq/L (ref 3–11)
AST: 14 U/L (ref 5–34)
Alkaline Phosphatase: 74 U/L (ref 40–150)
BUN: 15.6 mg/dL (ref 7.0–26.0)
CALCIUM: 9.4 mg/dL (ref 8.4–10.4)
CHLORIDE: 108 meq/L (ref 98–109)
CO2: 23 mEq/L (ref 22–29)
Creatinine: 0.8 mg/dL (ref 0.7–1.3)
Glucose: 199 mg/dl — ABNORMAL HIGH (ref 70–140)
POTASSIUM: 4.4 meq/L (ref 3.5–5.1)
Sodium: 141 mEq/L (ref 136–145)
Total Bilirubin: 0.27 mg/dL (ref 0.20–1.20)
Total Protein: 6.3 g/dL — ABNORMAL LOW (ref 6.4–8.3)

## 2013-10-18 LAB — CBC WITH DIFFERENTIAL/PLATELET
BASO%: 0.5 % (ref 0.0–2.0)
BASOS ABS: 0.1 10*3/uL (ref 0.0–0.1)
EOS%: 4.2 % (ref 0.0–7.0)
Eosinophils Absolute: 0.5 10*3/uL (ref 0.0–0.5)
HEMATOCRIT: 44.6 % (ref 38.4–49.9)
HEMOGLOBIN: 15.3 g/dL (ref 13.0–17.1)
LYMPH#: 1.6 10*3/uL (ref 0.9–3.3)
LYMPH%: 13.4 % — ABNORMAL LOW (ref 14.0–49.0)
MCH: 29.9 pg (ref 27.2–33.4)
MCHC: 34.3 g/dL (ref 32.0–36.0)
MCV: 87.3 fL (ref 79.3–98.0)
MONO#: 1.1 10*3/uL — AB (ref 0.1–0.9)
MONO%: 9.7 % (ref 0.0–14.0)
NEUT#: 8.5 10*3/uL — ABNORMAL HIGH (ref 1.5–6.5)
NEUT%: 72.2 % (ref 39.0–75.0)
Platelets: 140 10*3/uL (ref 140–400)
RBC: 5.11 10*6/uL (ref 4.20–5.82)
RDW: 12.8 % (ref 11.0–14.6)
WBC: 11.8 10*3/uL — ABNORMAL HIGH (ref 4.0–10.3)

## 2013-10-18 LAB — MORPHOLOGY
PLT EST: ADEQUATE
RBC Comments: NORMAL

## 2013-10-18 LAB — LACTATE DEHYDROGENASE (CC13): LDH: 165 U/L (ref 125–245)

## 2013-10-18 NOTE — Progress Notes (Signed)
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 According to the patient, he was diagnosed incidentally when he was undergoing carotid ultrasound for history of stroke. Biopsy from the right neck lymph node confirm diagnosis of diffuse large B-cell lymphoma. Between July to August 2011, he received 3 cycles of R. CHOP chemotherapy followed by consolidation radiation therapy.  INTERVAL HISTORY: [name] 73 y.o. male returns for further followup. The patient is sad that his wife recently her recurrent stroke. He had one episode urinary tract infection recently, resolved The patient is on antiplatelet agents for history of stroke. He has occasional weakness and occasional falls at home. Apart from easy bruising, he denies active bleeding.  I have reviewed the past medical history, past surgical history, social history and family history with the patient and they are unchanged from previous note.  ALLERGIES:  is allergic to actos; hydrochlorothiazide; and penicillins.  MEDICATIONS:  Current Outpatient Prescriptions  Medication Sig Dispense Refill  . ACCU-CHEK AVIVA PLUS test strip 1 each by Other route as needed (blod sugar).       [name] amLODipine (NORVASC) 5 MG tablet Take 5 mg by mouth daily.       [name] aspirin 325 MG tablet Take 325 mg by mouth 3 (three) times a week. Monday, Wednesday, and Friday.      . clopidogrel (PLAVIX) 75 MG tablet Take 75 mg by mouth daily after breakfast.       . insulin glargine (LANTUS) 100 UNIT/ML injection Inject 42 Units into the skin every evening.       [name] lisinopril (PRINIVIL,ZESTRIL) 20 MG  tablet Take 20 mg by mouth every morning.       . metFORMIN (GLUCOPHAGE) 500 MG tablet Take 500 mg by mouth daily with breakfast.       . metoprolol tartrate (LOPRESSOR) 25 MG tablet Take 25 mg by mouth 2 (two) times daily.       . Probiotic Product (ALIGN PO) Take 1 capsule by mouth daily.       [name] saccharomyces boulardii (FLORASTOR) 250 MG capsule Take 1 capsule (250 mg total) by mouth 2 (two) times daily.  30 capsule  0  . simvastatin (ZOCOR) 20 MG tablet Take 20 mg by mouth at bedtime.         No current facility-administered medications for this visit.    REVIEW OF SYSTEMS:   Constitutional: Denies fevers, chills or abnormal weight loss Eyes: Denies blurriness of vision Ears, nose, mouth, throat, and face: Denies mucositis or sore throat Respiratory: Denies cough, dyspnea or wheezes Cardiovascular: Denies palpitation, chest discomfort or lower extremity swelling Gastrointestinal:  Denies nausea, heartburn or change in bowel habits Skin: Denies abnormal skin rashes Lymphatics: Denies new lymphadenopathy  Neurological:Denies numbness, tingling or new weaknesses Behavioral/Psych: Mood is stable, no new changes  All other systems were reviewed with the patient and are negative.  PHYSICAL EXAMINATION: ECOG PERFORMANCE STATUS: 1 - Symptomatic but completely ambulatory  Filed Vitals:   10/18/13 1433  BP: 157/76  Pulse: 75  Temp: 98 F (36.7 C)  Resp: 18   Filed Weights   10/18/13 1433  Weight: 216 lb 4.8 oz (  98.113 kg)    GENERAL:alert, no distress and comfortable SKIN: skin color, texture, turgor are normal, no rashes or significant lesions EYES: normal, Conjunctiva are pink and non-injected, sclera clear OROPHARYNX:no exudate, no erythema and lips, buccal mucosa, and tongue normal  NECK: supple, thyroid normal size, non-tender, without nodularity LYMPH:  no palpable lymphadenopathy in the cervical, axillary or inguinal LUNGS: clear to auscultation and percussion with  normal breathing effort HEART: regular rate & rhythm and no murmurs and no lower extremity edema ABDOMEN:abdomen soft, non-tender and normal bowel sounds Musculoskeletal:no cyanosis of digits and no clubbing  NEURO: alert & oriented x 3 with fluent speech, no focal motor/sensory deficits  LABORATORY DATA:  I have reviewed the data as listed    Component Value Date/Time   NA 141 10/18/2013 1405   NA 136* 09/17/2013 0635   NA 141 10/08/2011 0918   K 4.4 10/18/2013 1405   K 3.9 09/17/2013 0635   K 4.4 10/08/2011 0918   CL 101 09/17/2013 0635   CL 101 10/07/2012 0907   CL 103 10/08/2011 0918   CO2 23 10/18/2013 1405   CO2 24 09/17/2013 0635   CO2 27 10/08/2011 0918   GLUCOSE 199* 10/18/2013 1405   GLUCOSE 174* 09/17/2013 0635   GLUCOSE 92 10/07/2012 0907   GLUCOSE 96 10/08/2011 0918   BUN 15.6 10/18/2013 1405   BUN 16 09/17/2013 0635   BUN 10 10/08/2011 0918   CREATININE 0.8 10/18/2013 1405   CREATININE 0.61 09/17/2013 0635   CREATININE 0.9 10/08/2011 0918   CALCIUM 9.4 10/18/2013 1405   CALCIUM 8.0* 09/17/2013 0635   CALCIUM 8.4 10/08/2011 0918   PROT 6.3* 10/18/2013 1405   PROT 6.3 09/16/2013 1053   PROT 6.8 10/08/2011 0918   ALBUMIN 3.2* 10/18/2013 1405   ALBUMIN 3.0* 09/16/2013 1053   AST 14 10/18/2013 1405   AST 13 09/16/2013 1053   AST 21 10/08/2011 0918   ALT 11 10/18/2013 1405   ALT 12 09/16/2013 1053   ALT 30 10/08/2011 0918   ALKPHOS 74 10/18/2013 1405   ALKPHOS 75 09/16/2013 1053   ALKPHOS 71 10/08/2011 0918   BILITOT 0.27 10/18/2013 1405   BILITOT 0.7 09/16/2013 1053   BILITOT 0.80 10/08/2011 0918   GFRNONAA >90 09/17/2013 0635   GFRAA >90 09/17/2013 0635    No results found for this basename: SPEP,  UPEP,   kappa and lambda light chains    Lab Results  Component Value Date   WBC 11.8* 10/18/2013   NEUTROABS 8.5* 10/18/2013   HGB 15.3 10/18/2013   HCT 44.6 10/18/2013   MCV 87.3 10/18/2013   PLT 140 10/18/2013      Chemistry      Component Value Date/Time   NA 141 10/18/2013 1405   NA  136* 09/17/2013 0635   NA 141 10/08/2011 0918   K 4.4 10/18/2013 1405   K 3.9 09/17/2013 0635   K 4.4 10/08/2011 0918   CL 101 09/17/2013 0635   CL 101 10/07/2012 0907   CL 103 10/08/2011 0918   CO2 23 10/18/2013 1405   CO2 24 09/17/2013 0635   CO2 27 10/08/2011 0918   BUN 15.6 10/18/2013 1405   BUN 16 09/17/2013 0635   BUN 10 10/08/2011 0918   CREATININE 0.8 10/18/2013 1405   CREATININE 0.61 09/17/2013 0635   CREATININE 0.9 10/08/2011 0918      Component Value Date/Time   CALCIUM 9.4 10/18/2013 1405   CALCIUM 8.0* 09/17/2013 0635   CALCIUM 8.4  10/08/2011 0918   ALKPHOS 74 10/18/2013 1405   ALKPHOS 75 09/16/2013 1053   ALKPHOS 71 10/08/2011 0918   AST 14 10/18/2013 1405   AST 13 09/16/2013 1053   AST 21 10/08/2011 0918   ALT 11 10/18/2013 1405   ALT 12 09/16/2013 1053   ALT 30 10/08/2011 0918   BILITOT 0.27 10/18/2013 1405   BILITOT 0.7 09/16/2013 1053   BILITOT 0.80 10/08/2011 0918     ASSESSMENT & PLAN:  #1 history of diffuse large B-cell lymphoma There is no evidence of disease on history and physical examination. I educated the patient's signs and symptoms to watch out for. The present time, there is no clinical benefit of routine imaging study. I will continue to see him in one year with history, physical examination and blood work. #2 severe hypertension His blood pressure is very high but I suspect this an element of anxiety. We will recheck his blood pressure the second time, his systolic blood pressure has improved to 150. I recommend the patient to continue followup with his primary care provider for blood pressure medication adjustment, given history of stroke. #3 bruising This is related to his antiplatelet agents. His platelet count is stable #4 recurrent falls I recommend he consult with his primary care physician. The patient may need neurological workup.  Orders Placed This Encounter  Procedures  . CBC with Differential    Standing Status: Future     Number of Occurrences:       Standing Expiration Date: 10/18/2014  . Comprehensive metabolic panel    Standing Status: Future     Number of Occurrences:      Standing Expiration Date: 10/18/2014  . Lactate dehydrogenase    Standing Status: Future     Number of Occurrences:      Standing Expiration Date: 10/18/2014   All questions were answered. The patient knows to call the clinic with any problems, questions or concerns. No barriers to learning was detected. I spent 15 minutes counseling the patient face to face. The total time spent in the appointment was 20 minutes and more than 50% was on counseling and review of test results     Heath Lark, MD 10/18/2013 4:23 PM

## 2013-10-19 ENCOUNTER — Telehealth: Payer: Self-pay | Admitting: Hematology and Oncology

## 2013-10-19 NOTE — Telephone Encounter (Signed)
S/w the pt and he is aware of his 1 year f/u appt in may 2016. °

## 2013-12-29 ENCOUNTER — Ambulatory Visit: Payer: Medicare Other

## 2014-02-09 ENCOUNTER — Ambulatory Visit (INDEPENDENT_AMBULATORY_CARE_PROVIDER_SITE_OTHER): Payer: Medicare Other

## 2014-02-09 VITALS — BP 133/62 | HR 62 | Temp 98.8°F | Resp 16 | Ht 71.0 in | Wt 215.0 lb

## 2014-02-09 DIAGNOSIS — L97509 Non-pressure chronic ulcer of other part of unspecified foot with unspecified severity: Secondary | ICD-10-CM

## 2014-02-09 DIAGNOSIS — M79609 Pain in unspecified limb: Secondary | ICD-10-CM

## 2014-02-09 DIAGNOSIS — L02619 Cutaneous abscess of unspecified foot: Secondary | ICD-10-CM

## 2014-02-09 DIAGNOSIS — M79676 Pain in unspecified toe(s): Secondary | ICD-10-CM

## 2014-02-09 DIAGNOSIS — L03039 Cellulitis of unspecified toe: Secondary | ICD-10-CM

## 2014-02-09 DIAGNOSIS — E1149 Type 2 diabetes mellitus with other diabetic neurological complication: Secondary | ICD-10-CM

## 2014-02-09 DIAGNOSIS — E1142 Type 2 diabetes mellitus with diabetic polyneuropathy: Secondary | ICD-10-CM

## 2014-02-09 DIAGNOSIS — B351 Tinea unguium: Secondary | ICD-10-CM

## 2014-02-09 DIAGNOSIS — Q828 Other specified congenital malformations of skin: Secondary | ICD-10-CM

## 2014-02-09 DIAGNOSIS — E1140 Type 2 diabetes mellitus with diabetic neuropathy, unspecified: Secondary | ICD-10-CM

## 2014-02-09 MED ORDER — CLINDAMYCIN HCL 150 MG PO CAPS: 150.0000 mg | ORAL_CAPSULE | Freq: Four times a day (QID) | ORAL | Status: DC

## 2014-02-09 MED ORDER — SILVER SULFADIAZINE 1 % EX CREA: 1.0000 "application " | TOPICAL_CREAM | Freq: Every day | CUTANEOUS | Status: DC

## 2014-02-09 NOTE — Progress Notes (Signed)
Subjective:    Patient ID: [name], male    DOB: 1940/12/04, 73 y.o.   MRN: [id number]  HPI Comments: N diabetic ulcer L right 2nd distal toe D 2 months, began as a callous that pt picked off O pt states took Cipro for a bladder infection and it got the toe better so he waited to come in C red, swollen with eschar to distal toe, and drainage A hammer toe and diabetes T Neosporin ointment at initial episode, not only bandaid  Pt states he trimmed the right 1st toenail about 2 months ago, because it hurt then it became infected.  Pt request debridement of 9 toenails.  Toe Pain       Review of Systems  Genitourinary:       Hx of UTI 2 months ago.  All other systems reviewed and are negative.      Objective:   Physical Exam 73 year old white male presents at this time for diabetic foot nail care however has several new issues apparently has picked off callus of the tip of her second toe which developed into an ulcer is been going on for 2 months now he appears he treated a UTI infection with Cipro which may better temporarily however since flareup peas to bleeding and draining from the distal tuft of the second digit. He status post amputation third toe on that same foot. Also is thick deformed dystrophic mycotic nails the remaining toes patient also is keratoses sub-fourth MTP area left foot. Or extremity objective findings as follows vascular status is intact although diminished DP plus one over 4 bilateral PT one over 4 bilateral capillary refill time 4 seconds epicritic sensations to the absent on [employer] testing to forefoot digits and ankle there is normal plantar response DTRs not listed dermatologic the skin color pigment normal hair growth absent nails severely gratified discolored friable brittle x9. Patient has ulceration distal tuft second digit right foot which nails. Is avulsed is an ulceration on debridement 1 cm in diameter distal tuft of the second  digit. Mild serous discharge drainage or discharge drainage noted does not probe down to bone no x-rays taken at this time. 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 Assessment #2 is ulceration with cellulitis and abscess distal tuft second digit right foot the ulcer is cleansed with peroxide debrided down to a pinpoint bleeding does not go down to bone or capsule Silvadene and gauze dressing are applied will maintain Silvadene gauze dressings daily as instructed prescriptions for clindamycin and for Silvadene are given at this time we'll followup in 2 weeks for reevaluation. Assessment #3 is porokeratosis secondary plantarflexed fourth metatarsal the keratotic lesion sub-fourth left is debrided some pinpoint is treated with lumicain and Neosporin and Band-Aid dressing are applied.  Reappointed for followup in 2 weeks and plan for 3 month followup for future palliative nail care patient is also advised she needs to come  immediately if there's any bleeding ulceration or changes not to wait extended period of time is been 606 months and since last seen. Next  Harriet Masson DPM

## 2014-02-09 NOTE — Patient Instructions (Signed)
ANTIBACTERIAL SOAP INSTRUCTIONS  THE DAY AFTER PROCEDURE  Please follow the instructions your doctor has marked.   Shower as usual. Before getting out, place a drop of antibacterial liquid soap (Dial) on a wet, clean washcloth.  Gently wipe washcloth over affected area.  Afterward, rinse the area with warm water.  Blot the area dry with a soft cloth and cover with antibiotic ointment (neosporin, polysporin, bacitracin) and band aid or gauze and tape  Place 3-4 drops of antibacterial liquid soap in a quart of warm tap water.  Submerge foot into water for 20 minutes.  If bandage was applied after your procedure, leave on to allow for easy lift off, then remove and continue with soak for the remaining time.  Next, blot area dry with a soft cloth and cover with a bandage.  Apply other medications as directed by your doctor, such as cortisporin otic solution (eardrops) or neosporin antibiotic ointment  Washer soak the second toe right foot ulcer site with soap and water rinse and dry thoroughly then apply Silvadene and gauze dressing. Reapply dressing daily as instructed until resolved

## 2014-02-23 ENCOUNTER — Ambulatory Visit (INDEPENDENT_AMBULATORY_CARE_PROVIDER_SITE_OTHER): Payer: Medicare Other

## 2014-02-23 VITALS — BP 117/94 | HR 60 | Resp 16

## 2014-02-23 DIAGNOSIS — E1149 Type 2 diabetes mellitus with other diabetic neurological complication: Secondary | ICD-10-CM

## 2014-02-23 DIAGNOSIS — L97509 Non-pressure chronic ulcer of other part of unspecified foot with unspecified severity: Secondary | ICD-10-CM

## 2014-02-23 DIAGNOSIS — E1140 Type 2 diabetes mellitus with diabetic neuropathy, unspecified: Secondary | ICD-10-CM

## 2014-02-23 DIAGNOSIS — M2040 Other hammer toe(s) (acquired), unspecified foot: Secondary | ICD-10-CM

## 2014-02-23 DIAGNOSIS — E1142 Type 2 diabetes mellitus with diabetic polyneuropathy: Secondary | ICD-10-CM

## 2014-02-23 NOTE — Progress Notes (Signed)
   Subjective:    Patient ID: [name], male    DOB: Aug 12, 1940, 73 y.o.   MRN: [id number]  HPI Comments: "It drains some on that toe, but better I think"  Foot Ulcer - Follow up 2nd toe right and sub 3rd MPJ left      Review of Systems no new findings or systemic changes noted    Objective:   Physical Exam Lower extremity exam unchanged pedal pulses DP plus one over 4 bilateral PT one over 4 bilateral capillary refill time 4 seconds all digits decreased sensation confirmed on [employer] testing in his history of previous dictation third toe right foot diabetes with neuropathy and complications noted keratoses and healing ulcer sub-third left which is debrided slight eschar tissue is present no discharge or drainage or maintain Silvadene and Band-Aid dressing. There is still ulceration distal tuft second digit right about a 2 x 4 mm of ulcer still identified down to the dermal level minimal bleeding so identified with a bandage and dressings. 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 Had previous amputations. At this time the ulcer is debrided to fit to 4 mm ulcer distal tuft of the second digit right foot presto dressing is applied as indicated pain Silvadene dressing daily written instructions given recheck in one month for followup continued offloaded cushion the area with tube foam padding as instructed  Harriet Masson DPM

## 2014-02-23 NOTE — Patient Instructions (Signed)
ANTIBACTERIAL SOAP INSTRUCTIONS  THE DAY AFTER PROCEDURE  Please follow the instructions your doctor has marked.   Shower as usual. Before getting out, place a drop of antibacterial liquid soap (Dial) on a wet, clean washcloth.  Gently wipe washcloth over affected area.  Afterward, rinse the area with warm water.  Blot the area dry with a soft cloth and cover with antibiotic ointment (neosporin, polysporin, bacitracin) and band aid or gauze and tape  Place 3-4 drops of antibacterial liquid soap in a quart of warm tap water.  Submerge foot into water for 20 minutes.  If bandage was applied after your procedure, leave on to allow for easy lift off, then remove and continue with soak for the remaining time.  Next, blot area dry with a soft cloth and cover with a bandage.  Apply other medications as directed by your doctor, such as cortisporin otic solution (eardrops) or neosporin antibiotic ointment  Wash foot and toes daily antibacterial soap and warm water as instructed applying Silvadene and Band-Aid dressing to the second toe of right foot maintain dressing changes daily until the wound resolves it stops draining completely. Also made maintain antibiotic ointment and a Band-Aid dressing to the area on the bottom of left foot however to discontinue once drainage has been stopped for more than 3 or 4 to

## 2014-03-27 ENCOUNTER — Ambulatory Visit (INDEPENDENT_AMBULATORY_CARE_PROVIDER_SITE_OTHER): Payer: Medicare Other

## 2014-03-27 VITALS — BP 158/81 | HR 71 | Temp 97.2°F | Resp 15 | Ht 71.0 in | Wt 210.0 lb

## 2014-03-27 DIAGNOSIS — M2040 Other hammer toe(s) (acquired), unspecified foot: Secondary | ICD-10-CM

## 2014-03-27 DIAGNOSIS — L97511 Non-pressure chronic ulcer of other part of right foot limited to breakdown of skin: Secondary | ICD-10-CM

## 2014-03-27 DIAGNOSIS — E1140 Type 2 diabetes mellitus with diabetic neuropathy, unspecified: Secondary | ICD-10-CM

## 2014-03-27 NOTE — Progress Notes (Signed)
   Subjective:    Patient ID: [name], male    DOB: 07-11-1940, 73 y.o.   MRN: [id number]  HPI Comments: Pt states the right 2nd toe looks better to him.  Pt states he is not dressing the toe at this time.     Review of Systems no new findings or systemic changes noted     Objective:   Physical Exam Neurovascular status is intact and unchanged pedal pulses palpable DP and PT one over 4 bilateral distal tuft second digit right foot shows hemorrhage a keratoses down to dermal level this is debrided today's visit there is no discharge or drainage noted the ulcer is about 2 mm in diameter distal tuft. There is no discharge drainage no secondary infection profound diabetic neuropathy and angiopathy noted history of previous partial amputation.       Assessment & Plan:  Assessment diabetes history peripheral neuropathy and angiopathy contractures of toes nails are otherwise stable the distal tuft ulceration second right is debrided down to dermal level Silvadene and Band-Aid dressing are applied and will continue with Neosporin Silvadene and Band-Aid dressing to initial tube foam pads are dispensed a cushioned area of the pressure on the distal toe the ulcer is resolving at this point discharge to 2 month followup for diabetic foot and palliative nail care and ulcer check at that time as well. Contacted me change or exacerbations in the interim.  Harriet Masson DPM

## 2014-03-27 NOTE — Patient Instructions (Signed)
Diabetes and Foot Care Diabetes may cause you to have problems because of poor blood supply (circulation) to your feet and legs. This may cause the skin on your feet to become thinner, break easier, and heal more slowly. Your skin may become dry, and the skin may peel and crack. You may also have nerve damage in your legs and feet causing decreased feeling in them. You may not notice minor injuries to your feet that could lead to infections or more serious problems. Taking care of your feet is one of the most important things you can do for yourself.  HOME CARE INSTRUCTIONS  Wear shoes at all times, even in the house. Do not go barefoot. Bare feet are easily injured.  Check your feet daily for blisters, cuts, and redness. If you cannot see the bottom of your feet, use a mirror or ask someone for help.  Wash your feet with warm water (do not use hot water) and mild soap. Then pat your feet and the areas between your toes until they are completely dry. Do not soak your feet as this can dry your skin.  Apply a moisturizing lotion or petroleum jelly (that does not contain alcohol and is unscented) to the skin on your feet and to dry, brittle toenails. Do not apply lotion between your toes.  Trim your toenails straight across. Do not dig under them or around the cuticle. File the edges of your nails with an emery board or nail file.  Do not cut corns or calluses or try to remove them with medicine.  Wear clean socks or stockings every day. Make sure they are not too tight. Do not wear knee-high stockings since they may decrease blood flow to your legs.  Wear shoes that fit properly and have enough cushioning. To break in new shoes, wear them for just a few hours a day. This prevents you from injuring your feet. Always look in your shoes before you put them on to be sure there are no objects inside.  Do not cross your legs. This may decrease the blood flow to your feet.  If you find a minor scrape,  cut, or break in the skin on your feet, keep it and the skin around it clean and dry. These areas may be cleansed with mild soap and water. Do not cleanse the area with peroxide, alcohol, or iodine.  When you remove an adhesive bandage, be sure not to damage the skin around it.  If you have a wound, look at it several times a day to make sure it is healing.  Do not use heating pads or hot water bottles. They may burn your skin. If you have lost feeling in your feet or legs, you may not know it is happening until it is too late.  Make sure your health care provider performs a complete foot exam at least annually or more often if you have foot problems. Report any cuts, sores, or bruises to your health care provider immediately. SEEK MEDICAL CARE IF:   You have an injury that is not healing.  You have cuts or breaks in the skin.  You have an ingrown nail.  You notice redness on your legs or feet.  You feel burning or tingling in your legs or feet.  You have pain or cramps in your legs and feet.  Your legs or feet are numb.  Your feet always feel cold. SEEK IMMEDIATE MEDICAL CARE IF:   There is increasing redness,   swelling, or pain in or around a wound.  There is a red line that goes up your leg.  Pus is coming from a wound.  You develop a fever or as directed by your health care provider.  You notice a bad smell coming from an ulcer or wound. Document Released: 06/05/2000 Document Revised: 02/08/2013 Document Reviewed: 11/15/2012 [employer] Patient Information 2015 [employer]. This information is not intended to replace advice given to you by your health care provider. Make sure you discuss any questions you have with your health care provider.  

## 2014-05-29 ENCOUNTER — Inpatient Hospital Stay (HOSPITAL_COMMUNITY)
Admission: EM | Admit: 2014-05-29 | Discharge: 2014-06-05 | DRG: 871 | Disposition: A | Discharge status: Home Health | Payer: Medicare Other | Attending: Internal Medicine | Admitting: Internal Medicine

## 2014-05-29 ENCOUNTER — Encounter (HOSPITAL_COMMUNITY): Payer: Self-pay | Admitting: Emergency Medicine

## 2014-05-29 ENCOUNTER — Emergency Department (HOSPITAL_COMMUNITY): Payer: Medicare Other

## 2014-05-29 DIAGNOSIS — W19XXXA Unspecified fall, initial encounter: Secondary | ICD-10-CM

## 2014-05-29 DIAGNOSIS — D72829 Elevated white blood cell count, unspecified: Secondary | ICD-10-CM | POA: Diagnosis present

## 2014-05-29 DIAGNOSIS — R0602 Shortness of breath: Secondary | ICD-10-CM | POA: Diagnosis not present

## 2014-05-29 DIAGNOSIS — D696 Thrombocytopenia, unspecified: Secondary | ICD-10-CM | POA: Diagnosis present

## 2014-05-29 DIAGNOSIS — Z9889 Other specified postprocedural states: Secondary | ICD-10-CM

## 2014-05-29 DIAGNOSIS — N390 Urinary tract infection, site not specified: Secondary | ICD-10-CM

## 2014-05-29 DIAGNOSIS — I10 Essential (primary) hypertension: Secondary | ICD-10-CM | POA: Diagnosis present

## 2014-05-29 DIAGNOSIS — Z7982 Long term (current) use of aspirin: Secondary | ICD-10-CM

## 2014-05-29 DIAGNOSIS — Z79899 Other long term (current) drug therapy: Secondary | ICD-10-CM

## 2014-05-29 DIAGNOSIS — E119 Type 2 diabetes mellitus without complications: Secondary | ICD-10-CM | POA: Diagnosis present

## 2014-05-29 DIAGNOSIS — I4891 Unspecified atrial fibrillation: Secondary | ICD-10-CM | POA: Diagnosis present

## 2014-05-29 DIAGNOSIS — R296 Repeated falls: Secondary | ICD-10-CM | POA: Diagnosis present

## 2014-05-29 DIAGNOSIS — Z8572 Personal history of non-Hodgkin lymphomas: Secondary | ICD-10-CM

## 2014-05-29 DIAGNOSIS — Z87891 Personal history of nicotine dependence: Secondary | ICD-10-CM

## 2014-05-29 DIAGNOSIS — L03113 Cellulitis of right upper limb: Secondary | ICD-10-CM | POA: Diagnosis present

## 2014-05-29 DIAGNOSIS — R300 Dysuria: Secondary | ICD-10-CM | POA: Diagnosis present

## 2014-05-29 DIAGNOSIS — A419 Sepsis, unspecified organism: Principal | ICD-10-CM

## 2014-05-29 DIAGNOSIS — G9340 Encephalopathy, unspecified: Secondary | ICD-10-CM | POA: Diagnosis present

## 2014-05-29 DIAGNOSIS — IMO0001 Reserved for inherently not codable concepts without codable children: Secondary | ICD-10-CM

## 2014-05-29 DIAGNOSIS — Z888 Allergy status to other drugs, medicaments and biological substances status: Secondary | ICD-10-CM

## 2014-05-29 DIAGNOSIS — I639 Cerebral infarction, unspecified: Secondary | ICD-10-CM

## 2014-05-29 DIAGNOSIS — Z880 Allergy status to penicillin: Secondary | ICD-10-CM

## 2014-05-29 DIAGNOSIS — Z794 Long term (current) use of insulin: Secondary | ICD-10-CM

## 2014-05-29 DIAGNOSIS — Z9114 Patient's other noncompliance with medication regimen: Secondary | ICD-10-CM | POA: Diagnosis present

## 2014-05-29 DIAGNOSIS — Z7902 Long term (current) use of antithrombotics/antiplatelets: Secondary | ICD-10-CM

## 2014-05-29 DIAGNOSIS — Z8673 Personal history of transient ischemic attack (TIA), and cerebral infarction without residual deficits: Secondary | ICD-10-CM

## 2014-05-29 LAB — URINALYSIS, ROUTINE W REFLEX MICROSCOPIC
Bilirubin Urine: NEGATIVE
Glucose, UA: 100 mg/dL — AB
KETONES UR: NEGATIVE mg/dL
NITRITE: NEGATIVE
PH: 8 (ref 5.0–8.0)
Protein, ur: 100 mg/dL — AB
Specific Gravity, Urine: 1.017 (ref 1.005–1.030)
Urobilinogen, UA: 0.2 mg/dL (ref 0.0–1.0)

## 2014-05-29 LAB — CBC WITH DIFFERENTIAL/PLATELET
BASOS ABS: 0 10*3/uL (ref 0.0–0.1)
BASOS PCT: 0 % (ref 0–1)
EOS ABS: 0 10*3/uL (ref 0.0–0.7)
Eosinophils Relative: 0 % (ref 0–5)
HCT: 40.7 % (ref 39.0–52.0)
Hemoglobin: 14.2 g/dL (ref 13.0–17.0)
Lymphocytes Relative: 3 % — ABNORMAL LOW (ref 12–46)
Lymphs Abs: 0.5 10*3/uL — ABNORMAL LOW (ref 0.7–4.0)
MCH: 30 pg (ref 26.0–34.0)
MCHC: 34.9 g/dL (ref 30.0–36.0)
MCV: 85.9 fL (ref 78.0–100.0)
MONOS PCT: 9 % (ref 3–12)
Monocytes Absolute: 2 10*3/uL — ABNORMAL HIGH (ref 0.1–1.0)
NEUTROS ABS: 18.6 10*3/uL — AB (ref 1.7–7.7)
NEUTROS PCT: 88 % — AB (ref 43–77)
PLATELETS: 126 10*3/uL — AB (ref 150–400)
RBC: 4.74 MIL/uL (ref 4.22–5.81)
RDW: 13 % (ref 11.5–15.5)
WBC: 21.2 10*3/uL — ABNORMAL HIGH (ref 4.0–10.5)

## 2014-05-29 LAB — COMPREHENSIVE METABOLIC PANEL
ALBUMIN: 3 g/dL — AB (ref 3.5–5.2)
ALT: 15 U/L (ref 0–53)
ANION GAP: 18 — AB (ref 5–15)
AST: 17 U/L (ref 0–37)
Alkaline Phosphatase: 76 U/L (ref 39–117)
BILIRUBIN TOTAL: 0.4 mg/dL (ref 0.3–1.2)
BUN: 17 mg/dL (ref 6–23)
CALCIUM: 8.6 mg/dL (ref 8.4–10.5)
CHLORIDE: 93 meq/L — AB (ref 96–112)
CO2: 21 mEq/L (ref 19–32)
CREATININE: 0.77 mg/dL (ref 0.50–1.35)
GFR calc Af Amer: >90 mL/min (ref >90)
GFR calc non Af Amer: 89 mL/min — ABNORMAL LOW (ref >90)
Glucose, Bld: 238 mg/dL — ABNORMAL HIGH (ref 70–99)
Potassium: 4.5 mEq/L (ref 3.7–5.3)
Sodium: 132 mEq/L — ABNORMAL LOW (ref 137–147)
TOTAL PROTEIN: 6.7 g/dL (ref 6.0–8.3)

## 2014-05-29 LAB — I-STAT ARTERIAL BLOOD GAS, ED
Acid-base deficit: 3 mmol/L — ABNORMAL HIGH (ref 0.0–2.0)
Bicarbonate: 22.1 mEq/L (ref 20.0–24.0)
O2 Saturation: 98 %
PCO2 ART: 44.3 mmHg (ref 35.0–45.0)
PH ART: 7.318 — AB (ref 7.350–7.450)
TCO2: 23 mmol/L (ref 0–100)
pO2, Arterial: 133 mmHg — ABNORMAL HIGH (ref 80.0–100.0)

## 2014-05-29 LAB — I-STAT CG4 LACTIC ACID, ED
LACTIC ACID, VENOUS: 1.51 mmol/L (ref 0.5–2.2)
Lactic Acid, Venous: 4.45 mmol/L — ABNORMAL HIGH (ref 0.5–2.2)

## 2014-05-29 LAB — I-STAT TROPONIN, ED: Troponin i, poc: 0 ng/mL (ref 0.00–0.08)

## 2014-05-29 LAB — URINE MICROSCOPIC-ADD ON

## 2014-05-29 LAB — PROTIME-INR
INR: 1.14 (ref 0.00–1.49)
Prothrombin Time: 14.7 seconds (ref 11.6–15.2)

## 2014-05-29 LAB — AMMONIA: Ammonia: 36 umol/L (ref 11–60)

## 2014-05-29 MED ORDER — RISAQUAD PO CAPS
1.0000 | ORAL_CAPSULE | Freq: Every day | ORAL | Status: DC
  Administered 2014-05-30 – 2014-06-05 (×7): 1 via ORAL
  Filled 2014-05-29 (×14): qty 1

## 2014-05-29 MED ORDER — SODIUM CHLORIDE 0.9 % IJ SOLN
3.0000 mL | Freq: Two times a day (BID) | INTRAMUSCULAR | Status: DC
  Administered 2014-05-30 – 2014-06-05 (×11): 3 mL via INTRAVENOUS

## 2014-05-29 MED ORDER — SODIUM CHLORIDE 0.9 % IV SOLN
INTRAVENOUS | Status: DC
  Administered 2014-05-30: 12:00:00 via INTRAVENOUS

## 2014-05-29 MED ORDER — SODIUM CHLORIDE 0.9 % IV BOLUS (SEPSIS)
1000.0000 mL | INTRAVENOUS | Status: AC
  Administered 2014-05-29 (×3): 1000 mL via INTRAVENOUS

## 2014-05-29 MED ORDER — SACCHAROMYCES BOULARDII 250 MG PO CAPS
250.0000 mg | ORAL_CAPSULE | Freq: Two times a day (BID) | ORAL | Status: DC
  Administered 2014-05-30 – 2014-06-05 (×13): 250 mg via ORAL
  Filled 2014-05-29 (×14): qty 1

## 2014-05-29 MED ORDER — DEXTROSE 5 % IV SOLN
2.0000 g | Freq: Two times a day (BID) | INTRAVENOUS | Status: DC
  Filled 2014-05-29 (×2): qty 2

## 2014-05-29 MED ORDER — SODIUM CHLORIDE 0.9 % IV SOLN
1000.0000 mL | INTRAVENOUS | Status: DC
  Administered 2014-05-29: 1000 mL via INTRAVENOUS

## 2014-05-29 MED ORDER — HEPARIN SODIUM (PORCINE) 5000 UNIT/ML IJ SOLN
5000.0000 [IU] | Freq: Three times a day (TID) | INTRAMUSCULAR | Status: DC
  Administered 2014-05-30 – 2014-06-05 (×17): 5000 [IU] via SUBCUTANEOUS
  Filled 2014-05-29 (×18): qty 1

## 2014-05-29 MED ORDER — ACETAMINOPHEN 500 MG PO TABS
1000.0000 mg | ORAL_TABLET | Freq: Once | ORAL | Status: AC
  Administered 2014-05-29: 1000 mg via ORAL
  Filled 2014-05-29: qty 2

## 2014-05-29 MED ORDER — ONDANSETRON HCL 4 MG/2ML IJ SOLN
INTRAMUSCULAR | Status: AC
  Administered 2014-05-29: 4 mg
  Filled 2014-05-29: qty 2

## 2014-05-29 MED ORDER — SIMVASTATIN 20 MG PO TABS
20.0000 mg | ORAL_TABLET | Freq: Every day | ORAL | Status: DC
  Administered 2014-05-30 – 2014-06-04 (×6): 20 mg via ORAL
  Filled 2014-05-29 (×6): qty 1

## 2014-05-29 MED ORDER — CLOPIDOGREL BISULFATE 75 MG PO TABS
75.0000 mg | ORAL_TABLET | Freq: Every day | ORAL | Status: DC
  Administered 2014-05-30 – 2014-06-05 (×7): 75 mg via ORAL
  Filled 2014-05-29 (×7): qty 1

## 2014-05-29 MED ORDER — INSULIN GLARGINE 100 UNIT/ML ~~LOC~~ SOLN
40.0000 [IU] | Freq: Every day | SUBCUTANEOUS | Status: DC
  Administered 2014-05-30 – 2014-05-31 (×2): 40 [IU] via SUBCUTANEOUS
  Filled 2014-05-29 (×7): qty 0.4

## 2014-05-29 MED ORDER — CEFEPIME HCL 2 G IJ SOLR
2.0000 g | Freq: Once | INTRAMUSCULAR | Status: AC
  Administered 2014-05-29: 2 g via INTRAVENOUS
  Filled 2014-05-29: qty 2

## 2014-05-29 MED ORDER — ASPIRIN 325 MG PO TABS
325.0000 mg | ORAL_TABLET | Freq: Every day | ORAL | Status: DC
  Administered 2014-05-30: 325 mg via ORAL
  Filled 2014-05-29 (×2): qty 1

## 2014-05-29 NOTE — Progress Notes (Signed)
Chaplain visited with pt's daughter and wife. Noted much of his medical history with doctor and I. They seem to be handling situation very calmly. Pt's wife has suffered two heart attacks and three strokes, one within this year. Daughter noted that they are doing their best to manage her stress and keep it low. Pt's wife noted she has been under duress lately regarding her husband's health. He is a "stubborn man" and "doesnt admit when he's not doing well".   Will follow as needed. Daughter and Wife bedside.   Delford Field, Chaplain 05/29/2014

## 2014-05-29 NOTE — ED Notes (Signed)
C-collar removed per Dr Rosana Hoes

## 2014-05-29 NOTE — H&P (Signed)
Hospitalist Admission History and Physical  Patient name: [name] Medical record number: [id number] Date of birth: [birth year] Age: 73 y.o. Gender: male  Primary Care Provider: Donnie Coffin, MD  Chief Complaint: encephalopathy, UTI, sepsis  History of Present Illness:This is a 73 y.o. year old male with significant past medical history of CVA, IDDM, recurrent falls, hx/o afib, hx/o lymphoma s/p resection in remission presenting with encephalopathy, UTI, sepsis, fall. Family states the patient was diagnosed by his PCP with UTI as today's. Was prescribed Cipro. Per family patient has been noncompliant with medicine. Patient was found today to be confused as well as have a subjective fevers at home. Per report, patient was found by family between the toilet and the sink on the floor. Also confused. Initially had to be resuscitated by bag valve mask on EMS arrival that did improve after short course. Patient does not recollect incident. Is unaware of any direct head trauma. Present to the ER temp 98.4, heart rate in the 70s to 90s, respirations in the tens to 40s, blood pressure in the 110s to 190s over 40s to 150s. Satting greater than 93% on room air. White blood cell count 21.2, hemoglobin 14.2, creatinine 0.77. Blood sugar 240. Initial lactate of 4.45. Decreased to 1.5 after IV fluids. Urinalysis indicative of infection. Chest x-ray without infiltrate. Head and C-spine CT within normal limits apart from degenerative disc disease at C6-C7. Started on cefepime empirically. Blood and urine cultures obtained. EKG NSR.  Assessment and Plan: [name] is a 73 y.o. year old male presenting with encephalopathy, sepsis, UTI  Active Problems:   Encephalopathy   1- Encephalopathy  - likely multifactorial in setting of infectious process, toxic-metabolic etiologies -clinically resolved at bedside  -treat UTI  -CXR w/o infiltrate -ammonia WNL  -MRI r/o CVA recurrence   2- Sepsis/UTI   -meets criteria based on WBC and RR on admission  -likely secondary to urinary source  -noted rash on RU arm s/p flu shot-+ induration, non tender-no drainage-follow  -vanc and cefepime empirically  -panculture -lactate resolved  -follow   3- IDDM -cont home lantus -SSI  -A1C  -follow   4- HTN -labile BPs initially  -Head CT WNL  -BPs now LLN  -hold BP meds overnight follow  5 hx/o Afib  -EKG NSR on presentation  -rate controlled -likely not anticoagulation candidate given hx/o falls  -tele bed  -follow   6- Hx/o CVA -no focal deficits on exam  -check MRI r/o CVA recurrence given presentation -cont home regimen   7-hx/p lymphoma  -s/p resection  -in remission   FEN/GI: heart healthy-carb modified diet pending bedside swallow eval  Prophylaxis: sub q heparin  Disposition: pending further evaluation  Code Status:Full Code    Patient Active Problem List   Diagnosis Date Noted  . Encephalopathy 05/29/2014  . UTI (lower urinary tract infection) 09/16/2013  . Recurrent falls 09/16/2013  . History of CVA (cerebrovascular accident) 09/16/2013  . Leukocytosis 09/16/2013  . Thrombocytopenia 09/16/2013  . 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 Diabetes mellitus 10/09/2011  . Hypertension 10/09/2011  . A-fib    Past Medical History: Past Medical History  Diagnosis Date  . 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 Hypertension   . Diabetes mellitus   . A-fib   . Cataract   . Charcot-Marie-Tooth disease   . 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 Stroke     Past Surgical History: History reviewed. No  pertinent past surgical history.  Social History: History   Social History  . Marital Status: Married    Spouse Name: N/A    Number of Children: N/A  . Years of Education: N/A   Social History Main Topics  . Smoking status: Former Smoker -- 2.00 packs/day for 35 years    Quit date: 06/03/1991  . Smokeless tobacco: Former Systems developer     Quit date: 06/26/1990  . Alcohol Use: No  . Drug Use: No  . Sexual Activity: None   Other Topics Concern  . None   Social History Narrative    Family History: No family history on file.  Allergies: Allergies  Allergen Reactions  . Actos [Pioglitazone Hydrochloride] Other (See Comments)    Afib  . Hydrochlorothiazide Other (See Comments)    Lower potassium to low  . Penicillins Rash    Current Facility-Administered Medications  Medication Dose Route Frequency Provider Last Rate Last Dose  . 0.9 %  sodium chloride infusion  1,000 mL Intravenous Continuous Leata Mouse, MD 125 mL/hr at 05/29/14 2123 1,000 mL at 05/29/14 2123  . [START ON 05/30/2014] 0.9 %  sodium chloride infusion   Intravenous Continuous Shanda Howells, MD      . Derrill Memo ON 05/30/2014] aspirin tablet 325 mg  325 mg Oral Daily Shanda Howells, MD      . Derrill Memo ON 05/30/2014] bifidobacterium infantis (ALIGN) capsule 1 capsule  1 capsule Oral Daily Shanda Howells, MD      . Derrill Memo ON 05/30/2014] ceFEPIme (MAXIPIME) 2 g in dextrose 5 % 50 mL IVPB  2 g Intravenous Q12H Mariea Clonts, MD      . Derrill Memo ON 05/30/2014] clopidogrel (PLAVIX) tablet 75 mg  75 mg Oral QPC breakfast Shanda Howells, MD      . Derrill Memo ON 05/30/2014] heparin injection 5,000 Units  5,000 Units Subcutaneous 3 times per day Shanda Howells, MD      . Derrill Memo ON 05/30/2014] insulin glargine (LANTUS) injection 40 Units  40 Units Subcutaneous Daily Shanda Howells, MD      . Derrill Memo ON 05/30/2014] saccharomyces boulardii (FLORASTOR) capsule 250 mg  250 mg Oral BID Shanda Howells, MD      . Derrill Memo ON 05/30/2014] simvastatin (ZOCOR) tablet 20 mg  20 mg Oral QHS Shanda Howells, MD      . Derrill Memo ON 05/30/2014] sodium chloride 0.9 % injection 3 mL  3 mL Intravenous Q12H Shanda Howells, MD       Current Outpatient Prescriptions  Medication Sig Dispense Refill  . amLODipine (NORVASC) 5 MG tablet Take 5 mg by mouth daily.     [name] aspirin 325 MG tablet Take 325 mg by mouth daily.      . ciprofloxacin (CIPRO) 250 MG tablet Take 250 mg by mouth 2 (two) times daily.    . clopidogrel (PLAVIX) 75 MG tablet Take 75 mg by mouth daily after breakfast.     . insulin glargine (LANTUS) 100 UNIT/ML injection Inject 40 Units into the skin daily.     [name] lisinopril (PRINIVIL,ZESTRIL) 20 MG tablet Take 20 mg by mouth every morning.     . metFORMIN (GLUCOPHAGE) 500 MG tablet Take 500 mg by mouth daily with breakfast.     . metoprolol tartrate (LOPRESSOR) 25 MG tablet Take 25 mg by mouth 2 (two) times daily.     . simvastatin (ZOCOR) 20 MG tablet Take 20 mg by mouth at bedtime.      [name] ACCU-CHEK AVIVA PLUS test strip 1 each  by Other route as needed (blod sugar).     . clindamycin (CLEOCIN) 150 MG capsule Take 1 capsule (150 mg total) by mouth 4 (four) times daily. (Patient not taking: Reported on 05/29/2014) 40 capsule 0  . Probiotic Product (ALIGN PO) Take 1 capsule by mouth daily.     [name] saccharomyces boulardii (FLORASTOR) 250 MG capsule Take 1 capsule (250 mg total) by mouth 2 (two) times daily. (Patient not taking: Reported on 05/29/2014) 30 capsule 0  . silver sulfADIAZINE (SILVADENE) 1 % cream Apply 1 application topically daily. (Patient not taking: Reported on 05/29/2014) 50 g 1   Review Of Systems: 12 point ROS negative except as noted above in HPI.  Physical Exam: Filed Vitals:   05/29/14 2323  BP:   Pulse:   Temp: 98.4 F (36.9 C)  Resp:     General: alert and cooperative HEENT: PERRLA and extra ocular movement intact Heart: S1, S2 normal, no murmur, rub or gallop, regular rate and rhythm Lungs: clear to auscultation, no wheezes or rales and unlabored breathing Abdomen: abdomen is soft without significant tenderness, masses, organomegaly or guarding Extremities: extremities normal, atraumatic, no cyanosis or edema Skin:mild rash/induration on RU arm s/p flu shot-non tender  Neurology: normal without focal findings  Labs and Imaging: Lab Results  Component Value Date/Time    NA 132* 05/29/2014 08:04 PM   NA 141 10/18/2013 02:05 PM   NA 141 10/08/2011 09:18 AM   K 4.5 05/29/2014 08:04 PM   K 4.4 10/18/2013 02:05 PM   K 4.4 10/08/2011 09:18 AM   CL 93* 05/29/2014 08:04 PM   CL 101 10/07/2012 09:07 AM   CL 103 10/08/2011 09:18 AM   CO2 21 05/29/2014 08:04 PM   CO2 23 10/18/2013 02:05 PM   CO2 27 10/08/2011 09:18 AM   BUN 17 05/29/2014 08:04 PM   BUN 15.6 10/18/2013 02:05 PM   BUN 10 10/08/2011 09:18 AM   CREATININE 0.77 05/29/2014 08:04 PM   CREATININE 0.8 10/18/2013 02:05 PM   CREATININE 0.9 10/08/2011 09:18 AM   GLUCOSE 238* 05/29/2014 08:04 PM   GLUCOSE 199* 10/18/2013 02:05 PM   GLUCOSE 92 10/07/2012 09:07 AM   GLUCOSE 96 10/08/2011 09:18 AM   Lab Results  Component Value Date   WBC 21.2* 05/29/2014   HGB 14.2 05/29/2014   HCT 40.7 05/29/2014   MCV 85.9 05/29/2014   PLT 126* 05/29/2014   Urinalysis    Component Value Date/Time   COLORURINE YELLOW 05/29/2014 2125   APPEARANCEUR CLOUDY* 05/29/2014 2125   LABSPEC 1.017 05/29/2014 2125   PHURINE 8.0 05/29/2014 2125   GLUCOSEU 100* 05/29/2014 2125   HGBUR MODERATE* 05/29/2014 2125   BILIRUBINUR NEGATIVE 05/29/2014 2125   KETONESUR NEGATIVE 05/29/2014 2125   PROTEINUR 100* 05/29/2014 2125   UROBILINOGEN 0.2 05/29/2014 2125   NITRITE NEGATIVE 05/29/2014 2125   LEUKOCYTESUR TRACE* 05/29/2014 2125       Ct Head Wo Contrast  05/29/2014   CLINICAL DATA:  Found unresponsive but white, apneic at EMS arrival, response to pain, difficulty controlling breathing, can osseous during imaging, stroke, partial history hypertension, diabetes, B-cell lymphoma, Charcot-Marie-Tooth syndrome  EXAM: CT HEAD WITHOUT CONTRAST  CT CERVICAL SPINE WITHOUT CONTRAST  TECHNIQUE: Multidetector CT imaging of the head and cervical spine was performed following the standard protocol without intravenous contrast. Multiplanar CT image reconstructions of the cervical spine were also generated.  COMPARISON:  CT head  09/16/2013, CT neck 10/07/2012  FINDINGS: CT HEAD FINDINGS  Generalized atrophy.  Normal ventricular morphology.  No midline shift or mass effect.  Small vessel chronic ischemic changes of deep cerebral white matter.  No intracranial hemorrhage, mass lesion, or acute infarction.  Visualized paranasal sinuses and mastoid air cells clear.  Bones demineralized.  Atherosclerotic calcifications of internal carotid and vertebral arteries at skullbase.  CT CERVICAL SPINE FINDINGS  Scattered atherosclerotic calcifications greatest at the carotid bifurcations.  Visualized skullbase intact.  Diffuse motion artifact severely limits exam.  Disc space narrowing at C6-C7.  Prevertebral soft tissues normal thickness.  No gross evidence of fracture or subluxation identified within significant limitations of motion.  Lung apices clear.  IMPRESSION: Atrophy with small vessel chronic ischemic changes of deep cerebral white matter.  No acute intracranial abnormalities.  Severely limited assessment of the cervical spine secondary to motion.  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  No gross acute cervical spine abnormality identified on limited exam.   Electronically Signed   By: Lavonia Dana M.D.   On: 05/29/2014 20:36   Ct Cervical Spine Wo Contrast  05/29/2014   CLINICAL DATA:  Found unresponsive but white, apneic at EMS arrival, response to pain, difficulty controlling breathing, can osseous during imaging, stroke, partial history hypertension, diabetes, B-cell lymphoma, Charcot-Marie-Tooth syndrome  EXAM: CT HEAD WITHOUT CONTRAST  CT CERVICAL SPINE WITHOUT CONTRAST  TECHNIQUE: Multidetector CT imaging of the head and cervical spine was performed following the standard protocol without intravenous contrast. Multiplanar CT image reconstructions of the cervical spine were also generated.  COMPARISON:  CT head 09/16/2013, CT neck 10/07/2012  FINDINGS: CT HEAD FINDINGS  Generalized atrophy.  Normal ventricular morphology.  No  midline shift or mass effect.  Small vessel chronic ischemic changes of deep cerebral white matter.  No intracranial hemorrhage, mass lesion, or acute infarction.  Visualized paranasal sinuses and mastoid air cells clear.  Bones demineralized.  Atherosclerotic calcifications of internal carotid and vertebral arteries at skullbase.  CT CERVICAL SPINE FINDINGS  Scattered atherosclerotic calcifications greatest at the carotid bifurcations.  Visualized skullbase intact.  Diffuse motion artifact severely limits exam.  Disc space narrowing at C6-C7.  Prevertebral soft tissues normal thickness.  No gross evidence of fracture or subluxation identified within significant limitations of motion.  Lung apices clear.  IMPRESSION: Atrophy with small vessel chronic ischemic changes of deep cerebral white matter.  No acute intracranial abnormalities.  Severely limited assessment of the cervical spine secondary to motion.  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  No gross acute cervical spine abnormality identified on limited exam.   Electronically Signed   By: Lavonia Dana M.D.   On: 05/29/2014 20:36   Dg Chest Port 1 View  05/29/2014   CLINICAL DATA:  Shortness of breath and altered mental status. History of non-Hodgkin's lymphoma, diabetes, atrial fibrillation, B-cell lymphoma, CVA.  EXAM: PORTABLE CHEST - 1 VIEW  COMPARISON:  09/16/2013  FINDINGS: Shallow inspiration. The heart size and mediastinal contours are within normal limits. Both lungs are clear. The visualized skeletal structures are unremarkable.  IMPRESSION: No active disease.   Electronically Signed   By: Lucienne Capers M.D.   On: 05/29/2014 21:15           Shanda Howells MD  Pager: [phone]

## 2014-05-29 NOTE — Progress Notes (Signed)
ANTIBIOTIC CONSULT NOTE - INITIAL  Pharmacy Consult for cefepime Indication: rule out sepsis  Allergies  Allergen Reactions  . Actos [Pioglitazone Hydrochloride] Other (See Comments)    Afib  . Hydrochlorothiazide Other (See Comments)    Lower potassium to low  . Penicillins Rash    Patient Measurements:   Adjusted Body Weight:   Vital Signs: BP: 144/52 mmHg (12/08 2115) Pulse Rate: 85 (12/08 2115) Intake/Output from previous day:   Intake/Output from this shift:    Labs:  Recent Labs  05/29/14 2004  CREATININE 0.77   CrCl cannot be calculated (Unknown ideal weight.). No results for input(s): VANCOTROUGH, VANCOPEAK, VANCORANDOM, GENTTROUGH, GENTPEAK, GENTRANDOM, TOBRATROUGH, TOBRAPEAK, TOBRARND, AMIKACINPEAK, AMIKACINTROU, AMIKACIN in the last 72 hours.   Microbiology: No results found for this or any previous visit (from the past 720 hour(s)).  Medical History: Past Medical History  Diagnosis Date  . 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 Hypertension   . Diabetes mellitus   . A-fib   . Cataract   . Charcot-Marie-Tooth disease   . 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 Stroke     Medications:   (Not in a hospital admission)   Assessment: Recurrent UTIs, initiating abx for possible urosepsis. Tmax/24h 102, wbc 11.8, LA 4.4  Goal of Therapy:  resolution of infection   Plan:  Cefepime 2 g IV q12h Watch blood/urine cultures, renal fx, clinical course, duration of therapy  Hughes Better, PharmD, BCPS Clinical Pharmacist Pager: [phone] 05/29/2014 9:31 PM

## 2014-05-29 NOTE — ED Provider Notes (Signed)
CSN: [id number]     Arrival date & time 05/29/14  1954 History   First MD Initiated Contact with Patient 05/29/14 2003     Chief Complaint  Patient presents with  . Altered Mental Status     (Consider location/radiation/quality/duration/timing/severity/associated sxs/prior Treatment) HPI Patient is a 73 year old male with history of B cell lymphoma in remission, atrial fibrillation, hypertension, recurrent falls, and frequent urinary tract infections who presents with altered mental status. He was found by his wife unresponsive in the bathroom next to his walker. EMS was called and found him unresponsive, pale cool and breathing 2-3 times per minute. They assisted his ventilations, but he never regained consciousness. They found his glucose was 200, and with ventilations he was saturating 100%. He was persistently hypertensive with blood pressures in the 190s over 120s with them. Family was unable to provide much history to EMS.  On his arrival here the patient responds only to painful stimuli. He will open his eyes spontaneously, but is nonverbal.  Past Medical History  Diagnosis Date  . 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 Hypertension   . Diabetes mellitus   . A-fib   . Cataract   . Charcot-Marie-Tooth disease   . 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 Stroke    History reviewed. No pertinent past surgical history. No family history on file. History  Substance Use Topics  . Smoking status: Former Smoker -- 2.00 packs/day for 35 years    Quit date: 06/03/1991  . Smokeless tobacco: Former Systems developer    Quit date: 06/26/1990  . Alcohol Use: No    Review of Systems  Unable to perform ROS: Mental status change      Allergies  Actos; Hydrochlorothiazide; and Penicillins  Home Medications   Prior to Admission medications   Medication Sig Start Date End Date Taking? Authorizing Provider  ACCU-CHEK AVIVA PLUS test strip 1 each by Other route  as needed (blod sugar).  05/13/13   Historical Provider, MD  amLODipine (NORVASC) 5 MG tablet Take 5 mg by mouth daily.  05/01/13   Historical Provider, MD  aspirin 325 MG tablet Take 325 mg by mouth 3 (three) times a week. Monday, Wednesday, and Friday.    Historical Provider, MD  clindamycin (CLEOCIN) 150 MG capsule Take 1 capsule (150 mg total) by mouth 4 (four) times daily. 02/09/14   Harriet Masson, DPM  clopidogrel (PLAVIX) 75 MG tablet Take 75 mg by mouth daily after breakfast.     Historical Provider, MD  insulin glargine (LANTUS) 100 UNIT/ML injection Inject 42 Units into the skin every evening.     Historical Provider, MD  lisinopril (PRINIVIL,ZESTRIL) 20 MG tablet Take 20 mg by mouth every morning.     Historical Provider, MD  metFORMIN (GLUCOPHAGE) 500 MG tablet Take 500 mg by mouth daily with breakfast.  05/10/13   Historical Provider, MD  metoprolol tartrate (LOPRESSOR) 25 MG tablet Take 25 mg by mouth 2 (two) times daily.  03/28/11   Historical Provider, MD  Probiotic Product (ALIGN PO) Take 1 capsule by mouth daily.     Historical Provider, MD  saccharomyces boulardii (FLORASTOR) 250 MG capsule Take 1 capsule (250 mg total) by mouth 2 (two) times daily. 09/18/13   Thurnell Lose, MD  silver sulfADIAZINE (SILVADENE) 1 % cream Apply 1 application topically daily. 02/09/14   Harriet Masson, DPM  simvastatin (ZOCOR) 20 MG tablet Take 20 mg by mouth at bedtime.  Historical Provider, MD   BP 144/52 mmHg  Pulse 85  Resp 28  SpO2 100% Physical Exam  Constitutional: He appears distressed.  HENT:  Head: Normocephalic and atraumatic.  Eyes: EOM are normal. Pupils are equal, round, and reactive to light.  Neck: Neck supple.  No cervical tenderness  Cardiovascular: Normal rate, regular rhythm, normal heart sounds and intact distal pulses.   Pulmonary/Chest: Effort normal and breath sounds normal. He has no wheezes.  Tachypnea, mildly labored breathing  Abdominal: Soft. He exhibits  distension. He exhibits no mass. There is no tenderness. There is no guarding.  Musculoskeletal: Normal range of motion. He exhibits no edema or tenderness.  Neurological:  Localized pain, opens his eyes spontaneously, no verbal response.  Skin: Skin is warm and dry.  Nursing note and vitals reviewed.   ED Course  Procedures (including critical care time) Labs Review Labs Reviewed  COMPREHENSIVE METABOLIC PANEL - Abnormal; Notable for the following:    Sodium 132 (*)    Chloride 93 (*)    Glucose, Bld 238 (*)    Albumin 3.0 (*)    GFR calc non Af Amer 89 (*)    Anion gap 18 (*)    All other components within normal limits  I-STAT CG4 LACTIC ACID, ED - Abnormal; Notable for the following:    Lactic Acid, Venous 4.45 (*)    All other components within normal limits  I-STAT ARTERIAL BLOOD GAS, ED - Abnormal; Notable for the following:    pH, Arterial 7.318 (*)    pO2, Arterial 133.0 (*)    Acid-base deficit 3.0 (*)    All other components within normal limits  CULTURE, BLOOD (ROUTINE X 2)  CULTURE, BLOOD (ROUTINE X 2)  URINE CULTURE  AMMONIA  PROTIME-INR  URINALYSIS, ROUTINE W REFLEX MICROSCOPIC  BLOOD GAS, ARTERIAL  CBC WITH DIFFERENTIAL  CBG MONITORING, ED  I-STAT TROPOININ, ED    Imaging Review Ct Head Wo Contrast  05/29/2014   CLINICAL DATA:  Found unresponsive but white, apneic at EMS arrival, response to pain, difficulty controlling breathing, can osseous during imaging, stroke, partial history hypertension, diabetes, B-cell lymphoma, Charcot-Marie-Tooth syndrome  EXAM: CT HEAD WITHOUT CONTRAST  CT CERVICAL SPINE WITHOUT CONTRAST  TECHNIQUE: Multidetector CT imaging of the head and cervical spine was performed following the standard protocol without intravenous contrast. Multiplanar CT image reconstructions of the cervical spine were also generated.  COMPARISON:  CT head 09/16/2013, CT neck 10/07/2012  FINDINGS: CT HEAD FINDINGS  Generalized atrophy.  Normal ventricular  morphology.  No midline shift or mass effect.  Small vessel chronic ischemic changes of deep cerebral white matter.  No intracranial hemorrhage, mass lesion, or acute infarction.  Visualized paranasal sinuses and mastoid air cells clear.  Bones demineralized.  Atherosclerotic calcifications of internal carotid and vertebral arteries at skullbase.  CT CERVICAL SPINE FINDINGS  Scattered atherosclerotic calcifications greatest at the carotid bifurcations.  Visualized skullbase intact.  Diffuse motion artifact severely limits exam.  Disc space narrowing at C6-C7.  Prevertebral soft tissues normal thickness.  No gross evidence of fracture or subluxation identified within significant limitations of motion.  Lung apices clear.  IMPRESSION: Atrophy with small vessel chronic ischemic changes of deep cerebral white matter.  No acute intracranial abnormalities.  Severely limited assessment of the cervical spine secondary to motion.  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  No gross acute cervical spine abnormality identified on limited exam.   Electronically Signed   By: Crist Infante.D.  On: 05/29/2014 20:36   Ct Cervical Spine Wo Contrast  05/29/2014   CLINICAL DATA:  Found unresponsive but white, apneic at EMS arrival, response to pain, difficulty controlling breathing, can osseous during imaging, stroke, partial history hypertension, diabetes, B-cell lymphoma, Charcot-Marie-Tooth syndrome  EXAM: CT HEAD WITHOUT CONTRAST  CT CERVICAL SPINE WITHOUT CONTRAST  TECHNIQUE: Multidetector CT imaging of the head and cervical spine was performed following the standard protocol without intravenous contrast. Multiplanar CT image reconstructions of the cervical spine were also generated.  COMPARISON:  CT head 09/16/2013, CT neck 10/07/2012  FINDINGS: CT HEAD FINDINGS  Generalized atrophy.  Normal ventricular morphology.  No midline shift or mass effect.  Small vessel chronic ischemic changes of deep cerebral white matter.  No  intracranial hemorrhage, mass lesion, or acute infarction.  Visualized paranasal sinuses and mastoid air cells clear.  Bones demineralized.  Atherosclerotic calcifications of internal carotid and vertebral arteries at skullbase.  CT CERVICAL SPINE FINDINGS  Scattered atherosclerotic calcifications greatest at the carotid bifurcations.  Visualized skullbase intact.  Diffuse motion artifact severely limits exam.  Disc space narrowing at C6-C7.  Prevertebral soft tissues normal thickness.  No gross evidence of fracture or subluxation identified within significant limitations of motion.  Lung apices clear.  IMPRESSION: Atrophy with small vessel chronic ischemic changes of deep cerebral white matter.  No acute intracranial abnormalities.  Severely limited assessment of the cervical spine secondary to motion.  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  No gross acute cervical spine abnormality identified on limited exam.   Electronically Signed   By: Lavonia Dana M.D.   On: 05/29/2014 20:36   Dg Chest Port 1 View  05/29/2014   CLINICAL DATA:  Shortness of breath and altered mental status. History of non-Hodgkin's lymphoma, diabetes, atrial fibrillation, B-cell lymphoma, CVA.  EXAM: PORTABLE CHEST - 1 VIEW  COMPARISON:  09/16/2013  FINDINGS: Shallow inspiration. The heart size and mediastinal contours are within normal limits. Both lungs are clear. The visualized skeletal structures are unremarkable.  IMPRESSION: No active disease.   Electronically Signed   By: Lucienne Capers M.D.   On: 05/29/2014 21:15     EKG Interpretation None      MDM   Final diagnoses:  Fall  SOB (shortness of breath)   73 year old male with multiple medical problems presents with altered mental status. Initially upon patient's presentation, he was significantly hypertensive, obtunded, and had a history of falling. He was taken for emergently for a CT scan to rule out intracranial bleed versus CVA. No evidence of acute  intracranial bleeding.  Upon further examination, patient found to have a rectal temperature 102. His family arrived and was able to provide more history. They report that he was recently diagnosed with urinary tract infection, received a dose of Rocephin, and was prescribed Cipro, but his wife says he was only taking half of the pill.    Lactate elevated to 4. Considering his recent diagnosis of UTI and incomplete treatment, likely this is urosepsis, will treat with cefepime.   Patient reassessed, mental status continues to improve. Not complaining of any pain, no more difficulty breathing. Lactate rechecked and has cleared.  Hospitalist consulted for admission.  Leata Mouse, MD 05/30/14 0030  Mariea Clonts, MD 05/30/14 [id number]  Mariea Clonts, MD 05/30/14 [phone]

## 2014-05-29 NOTE — Progress Notes (Signed)
ANTIBIOTIC CONSULT NOTE - INITIAL  Pharmacy Consult for Vancomycin  Indication: rule out sepsis, ?urinary source  Allergies  Allergen Reactions  . Actos [Pioglitazone Hydrochloride] Other (See Comments)    Afib  . Hydrochlorothiazide Other (See Comments)    Lower potassium to low  . Penicillins Rash    Patient Measurements: ~98 kg  Vital Signs: Temp: 98.4 F (36.9 C) (12/08 2323) Temp Source: Oral (12/08 2323) BP: 113/49 mmHg (12/08 2315) Pulse Rate: 77 (12/08 2315)  Labs:  Recent Labs  05/29/14 2004 05/29/14 2210  WBC  --  21.2*  HGB  --  14.2  PLT  --  126*  CREATININE 0.77  --    Medical History: Past Medical History  Diagnosis Date  . 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 Hypertension   . Diabetes mellitus   . A-fib   . Cataract   . Charcot-Marie-Tooth disease   . 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 Stroke     Assessment: Broad spectrum anti-biotics for r/o sepsis. WBC elevated, renal function ok, other labs as above, already on cefepime.   Goal of Therapy:  Vancomycin trough level 15-20 mcg/ml  Plan:  -Vancomycin 1000 mg IV q12h -Trend WBC, temp, renal function  -Drug levels as indicated   Narda Bonds 05/29/2014,11:57 PM

## 2014-05-29 NOTE — ED Notes (Signed)
To ED from via [facility], was found unresponsive by wife, was apenic on EMS arrival, spontaneous breathing resumed after 2 BVM breaths, responds to pain on arrival  CBG 209 146/58 96 99% NRB

## 2014-05-30 ENCOUNTER — Encounter (HOSPITAL_COMMUNITY): Payer: Self-pay | Admitting: *Deleted

## 2014-05-30 ENCOUNTER — Observation Stay (HOSPITAL_COMMUNITY): Payer: Medicare Other

## 2014-05-30 DIAGNOSIS — Z880 Allergy status to penicillin: Secondary | ICD-10-CM | POA: Diagnosis not present

## 2014-05-30 DIAGNOSIS — D72829 Elevated white blood cell count, unspecified: Secondary | ICD-10-CM | POA: Diagnosis present

## 2014-05-30 DIAGNOSIS — N390 Urinary tract infection, site not specified: Secondary | ICD-10-CM

## 2014-05-30 DIAGNOSIS — Z79899 Other long term (current) drug therapy: Secondary | ICD-10-CM | POA: Diagnosis not present

## 2014-05-30 DIAGNOSIS — R296 Repeated falls: Secondary | ICD-10-CM | POA: Diagnosis present

## 2014-05-30 DIAGNOSIS — Z9889 Other specified postprocedural states: Secondary | ICD-10-CM | POA: Diagnosis not present

## 2014-05-30 DIAGNOSIS — A419 Sepsis, unspecified organism: Secondary | ICD-10-CM | POA: Diagnosis present

## 2014-05-30 DIAGNOSIS — Z794 Long term (current) use of insulin: Secondary | ICD-10-CM | POA: Diagnosis not present

## 2014-05-30 DIAGNOSIS — Z8572 Personal history of non-Hodgkin lymphomas: Secondary | ICD-10-CM | POA: Diagnosis not present

## 2014-05-30 DIAGNOSIS — Z87891 Personal history of nicotine dependence: Secondary | ICD-10-CM | POA: Diagnosis not present

## 2014-05-30 DIAGNOSIS — L03113 Cellulitis of right upper limb: Secondary | ICD-10-CM | POA: Diagnosis present

## 2014-05-30 DIAGNOSIS — E119 Type 2 diabetes mellitus without complications: Secondary | ICD-10-CM | POA: Diagnosis present

## 2014-05-30 DIAGNOSIS — D696 Thrombocytopenia, unspecified: Secondary | ICD-10-CM | POA: Diagnosis present

## 2014-05-30 DIAGNOSIS — Z888 Allergy status to other drugs, medicaments and biological substances status: Secondary | ICD-10-CM | POA: Diagnosis not present

## 2014-05-30 DIAGNOSIS — Z7982 Long term (current) use of aspirin: Secondary | ICD-10-CM | POA: Diagnosis not present

## 2014-05-30 DIAGNOSIS — R300 Dysuria: Secondary | ICD-10-CM | POA: Diagnosis present

## 2014-05-30 DIAGNOSIS — Z8673 Personal history of transient ischemic attack (TIA), and cerebral infarction without residual deficits: Secondary | ICD-10-CM | POA: Diagnosis not present

## 2014-05-30 DIAGNOSIS — I4891 Unspecified atrial fibrillation: Secondary | ICD-10-CM | POA: Diagnosis present

## 2014-05-30 DIAGNOSIS — Z7902 Long term (current) use of antithrombotics/antiplatelets: Secondary | ICD-10-CM | POA: Diagnosis not present

## 2014-05-30 DIAGNOSIS — I10 Essential (primary) hypertension: Secondary | ICD-10-CM | POA: Diagnosis present

## 2014-05-30 DIAGNOSIS — Z9114 Patient's other noncompliance with medication regimen: Secondary | ICD-10-CM | POA: Diagnosis present

## 2014-05-30 DIAGNOSIS — R0602 Shortness of breath: Secondary | ICD-10-CM | POA: Diagnosis present

## 2014-05-30 DIAGNOSIS — G9340 Encephalopathy, unspecified: Secondary | ICD-10-CM | POA: Diagnosis present

## 2014-05-30 LAB — GLUCOSE, CAPILLARY: Glucose-Capillary: 245 mg/dL — ABNORMAL HIGH (ref 70–99)

## 2014-05-30 LAB — COMPREHENSIVE METABOLIC PANEL
ALBUMIN: 2.5 g/dL — AB (ref 3.5–5.2)
ALK PHOS: 62 U/L (ref 39–117)
ALT: 33 U/L (ref 0–53)
ANION GAP: 13 (ref 5–15)
AST: 120 U/L — ABNORMAL HIGH (ref 0–37)
BUN: 18 mg/dL (ref 6–23)
CHLORIDE: 104 meq/L (ref 96–112)
CO2: 19 mEq/L (ref 19–32)
Calcium: 7.6 mg/dL — ABNORMAL LOW (ref 8.4–10.5)
Creatinine, Ser: 0.71 mg/dL (ref 0.50–1.35)
GLUCOSE: 170 mg/dL — AB (ref 70–99)
Potassium: 4.3 mEq/L (ref 3.7–5.3)
Sodium: 136 mEq/L — ABNORMAL LOW (ref 137–147)
Total Bilirubin: 0.4 mg/dL (ref 0.3–1.2)
Total Protein: 5.5 g/dL — ABNORMAL LOW (ref 6.0–8.3)

## 2014-05-30 LAB — HEMOGLOBIN A1C
Hgb A1c MFr Bld: 7.7 % — ABNORMAL HIGH (ref <5.7)
Mean Plasma Glucose: 174 mg/dL — ABNORMAL HIGH (ref <117)

## 2014-05-30 MED ORDER — METOPROLOL TARTRATE 25 MG PO TABS
25.0000 mg | ORAL_TABLET | Freq: Two times a day (BID) | ORAL | Status: DC
  Administered 2014-05-30 – 2014-06-05 (×12): 25 mg via ORAL
  Filled 2014-05-30 (×12): qty 1

## 2014-05-30 MED ORDER — CEFTRIAXONE SODIUM IN DEXTROSE 20 MG/ML IV SOLN
1.0000 g | INTRAVENOUS | Status: DC
  Administered 2014-05-30 – 2014-05-31 (×2): 1 g via INTRAVENOUS
  Filled 2014-05-30 (×3): qty 50

## 2014-05-30 MED ORDER — TAMSULOSIN HCL 0.4 MG PO CAPS
0.4000 mg | ORAL_CAPSULE | Freq: Every day | ORAL | Status: DC
  Administered 2014-05-30 – 2014-06-05 (×7): 0.4 mg via ORAL
  Filled 2014-05-30 (×7): qty 1

## 2014-05-30 MED ORDER — AMLODIPINE BESYLATE 5 MG PO TABS
5.0000 mg | ORAL_TABLET | Freq: Every day | ORAL | Status: DC
  Administered 2014-05-31 – 2014-06-05 (×6): 5 mg via ORAL
  Filled 2014-05-30 (×6): qty 1

## 2014-05-30 MED ORDER — METFORMIN HCL 500 MG PO TABS
500.0000 mg | ORAL_TABLET | Freq: Every day | ORAL | Status: DC
  Administered 2014-05-31 – 2014-06-05 (×6): 500 mg via ORAL
  Filled 2014-05-30 (×6): qty 1

## 2014-05-30 MED ORDER — VANCOMYCIN HCL IN DEXTROSE 1-5 GM/200ML-% IV SOLN
1000.0000 mg | Freq: Two times a day (BID) | INTRAVENOUS | Status: DC
  Administered 2014-05-30: 1000 mg via INTRAVENOUS
  Filled 2014-05-30 (×2): qty 200

## 2014-05-30 MED ORDER — LISINOPRIL 20 MG PO TABS
20.0000 mg | ORAL_TABLET | Freq: Every day | ORAL | Status: DC
  Administered 2014-05-30 – 2014-06-05 (×7): 20 mg via ORAL
  Filled 2014-05-30 (×7): qty 1

## 2014-05-30 MED ORDER — CALCIUM CARBONATE ANTACID 500 MG PO CHEW
400.0000 mg | CHEWABLE_TABLET | Freq: Three times a day (TID) | ORAL | Status: DC | PRN
  Administered 2014-05-30: 400 mg via ORAL
  Filled 2014-05-30: qty 1

## 2014-05-30 NOTE — ED Notes (Signed)
Nurse called Dr. Ernestina Patches if he wants the MRI done stat , advised nurse that MRI can be done in the morning.

## 2014-05-30 NOTE — Plan of Care (Signed)
Problem: Phase I Progression Outcomes Goal: Pain controlled with appropriate interventions Outcome: Completed/Met Date Met:  05/30/14 Goal: Hemodynamically stable Outcome: Completed/Met Date Met:  05/30/14

## 2014-05-30 NOTE — ED Notes (Signed)
Transporting patient to new room assignment. 

## 2014-05-30 NOTE — Progress Notes (Signed)
[facility] PROGRESS NOTE   Assessment/Plan: Acute Encephalopathy/Sepsis/UTI: - likely due to infectious process, toxic-metabolic etiologies - clinically resolved at bedside  - MRI no acute CVA. - noted rash on RU arm s/p flu shot-+ induration, non tender-no drainage-follow  - vanc and cefepime empirically  -panculture  IDDM - cont home lantus + SSI - A1C pending  HTN -BPs now High. - resume home meds.  hx/o Afib  - EKG NSR on presentation  - rate controlled - likely not anticoagulation candidate given hx/o falls   Hx/o CVA: - no focal deficits on exam  - MRI no acute CVA. - cont home regimen   7-hx/p lymphoma  -s/p resection  -in remission    Code Status: full Family Communication: wife and daughter  Disposition Plan: inpatient   Consultants:  none  Procedures:  CT head  MRI brain  Antibiotics:  vanc and cefepime  HPI/Subjective: Relates still with dysuria  Objective: Filed Vitals:   05/29/14 2323 05/30/14 0012 05/30/14 0117 05/30/14 0556  BP:  101/44 138/57 168/64  Pulse:  69 71 77  Temp: 98.4 F (36.9 C)  97.8 F (36.6 C) 98 F (36.7 C)  TempSrc: Oral  Oral Oral  Resp:  14 18 18   Height:   5\' 11"  (1.803 m)   Weight:   94.076 kg (207 lb 6.4 oz)   SpO2:  98% 96% 100%    Intake/Output Summary (Last 24 hours) at 05/30/14 1303 Last data filed at 05/30/14 1120  Gross per 24 hour  Intake   3170 ml  Output   1950 ml  Net   1220 ml   Filed Weights   05/30/14 0117  Weight: 94.076 kg (207 lb 6.4 oz)    Exam:  General: Alert, awake, oriented x3, in no acute distress.  HEENT: No bruits, no goiter.  Heart: Regular rate and rhythm. Lungs: Good air movement, clear Abdomen: Soft, nontender, nondistended, positive bowel sounds.  Neuro: Grossly intact, nonfocal.   Data Reviewed: Basic Metabolic Panel:  Recent Labs Lab 05/29/14 2004 05/30/14 0136  NA 132* 136*  K 4.5 4.3  CL 93* 104  CO2 21 19  GLUCOSE 238*  170*  BUN 17 18  CREATININE 0.77 0.71  CALCIUM 8.6 7.6*   Liver Function Tests:  Recent Labs Lab 05/29/14 2004 05/30/14 0136  AST 17 120*  ALT 15 33  ALKPHOS 76 62  BILITOT 0.4 0.4  PROT 6.7 5.5*  ALBUMIN 3.0* 2.5*   No results for input(s): LIPASE, AMYLASE in the last 168 hours.  Recent Labs Lab 05/29/14 2004  AMMONIA 36   CBC:  Recent Labs Lab 05/29/14 2210  WBC 21.2*  NEUTROABS 18.6*  HGB 14.2  HCT 40.7  MCV 85.9  PLT 126*   Cardiac Enzymes: No results for input(s): CKTOTAL, CKMB, CKMBINDEX, TROPONINI in the last 168 hours. BNP (last 3 results) No results for input(s): PROBNP in the last 8760 hours. CBG: No results for input(s): GLUCAP in the last 168 hours.  No results found for this or any previous visit (from the past 240 hour(s)).   Studies: Ct Head Wo Contrast  05/29/2014   CLINICAL DATA:  Found unresponsive but white, apneic at EMS arrival, response to pain, difficulty controlling breathing, can osseous during imaging, stroke, partial history hypertension, diabetes, B-cell lymphoma, Charcot-Marie-Tooth syndrome  EXAM: CT HEAD WITHOUT CONTRAST  CT CERVICAL SPINE WITHOUT CONTRAST  TECHNIQUE: Multidetector CT imaging of the head and cervical spine was performed following the standard protocol  without intravenous contrast. Multiplanar CT image reconstructions of the cervical spine were also generated.  COMPARISON:  CT head 09/16/2013, CT neck 10/07/2012  FINDINGS: CT HEAD FINDINGS  Generalized atrophy.  Normal ventricular morphology.  No midline shift or mass effect.  Small vessel chronic ischemic changes of deep cerebral white matter.  No intracranial hemorrhage, mass lesion, or acute infarction.  Visualized paranasal sinuses and mastoid air cells clear.  Bones demineralized.  Atherosclerotic calcifications of internal carotid and vertebral arteries at skullbase.  CT CERVICAL SPINE FINDINGS  Scattered atherosclerotic calcifications greatest at the carotid  bifurcations.  Visualized skullbase intact.  Diffuse motion artifact severely limits exam.  Disc space narrowing at C6-C7.  Prevertebral soft tissues normal thickness.  No gross evidence of fracture or subluxation identified within significant limitations of motion.  Lung apices clear.  IMPRESSION: Atrophy with small vessel chronic ischemic changes of deep cerebral white matter.  No acute intracranial abnormalities.  Severely limited assessment of the cervical spine secondary to motion.  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  No gross acute cervical spine abnormality identified on limited exam.   Electronically Signed   By: Lavonia Dana M.D.   On: 05/29/2014 20:36   Ct Cervical Spine Wo Contrast  05/29/2014   CLINICAL DATA:  Found unresponsive but white, apneic at EMS arrival, response to pain, difficulty controlling breathing, can osseous during imaging, stroke, partial history hypertension, diabetes, B-cell lymphoma, Charcot-Marie-Tooth syndrome  EXAM: CT HEAD WITHOUT CONTRAST  CT CERVICAL SPINE WITHOUT CONTRAST  TECHNIQUE: Multidetector CT imaging of the head and cervical spine was performed following the standard protocol without intravenous contrast. Multiplanar CT image reconstructions of the cervical spine were also generated.  COMPARISON:  CT head 09/16/2013, CT neck 10/07/2012  FINDINGS: CT HEAD FINDINGS  Generalized atrophy.  Normal ventricular morphology.  No midline shift or mass effect.  Small vessel chronic ischemic changes of deep cerebral white matter.  No intracranial hemorrhage, mass lesion, or acute infarction.  Visualized paranasal sinuses and mastoid air cells clear.  Bones demineralized.  Atherosclerotic calcifications of internal carotid and vertebral arteries at skullbase.  CT CERVICAL SPINE FINDINGS  Scattered atherosclerotic calcifications greatest at the carotid bifurcations.  Visualized skullbase intact.  Diffuse motion artifact severely limits exam.  Disc space narrowing at  C6-C7.  Prevertebral soft tissues normal thickness.  No gross evidence of fracture or subluxation identified within significant limitations of motion.  Lung apices clear.  IMPRESSION: Atrophy with small vessel chronic ischemic changes of deep cerebral white matter.  No acute intracranial abnormalities.  Severely limited assessment of the cervical spine secondary to motion.  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  No gross acute cervical spine abnormality identified on limited exam.   Electronically Signed   By: Lavonia Dana M.D.   On: 05/29/2014 20:36   Mr Brain Wo Contrast  05/30/2014   CLINICAL DATA:  Encephalopathy, sepsis, hypertension and diabetes, altered mental status.  EXAM: MRI HEAD WITHOUT CONTRAST  TECHNIQUE: Multiplanar, multiecho pulse sequences of the brain and surrounding structures were obtained without intravenous contrast.  COMPARISON:  CT head 05/29/2014.  FINDINGS: No areas of acute stroke or hemorrhage. Extensive cerebral and cerebellar atrophy. Scattered areas of remote brainstem and cerebellar infarction. Extensive chronic microvascular ischemic change throughout the periventricular and subcortical white matter. Chronic LEFT occipital microbleed, likely sequelae of hypertensive cerebrovascular disease. Flow voids are maintained. No acute sinus or mastoid disease. Mild pannus. No osseous lesion.  IMPRESSION: No acute abnormality.  Chronic changes as described.  Electronically Signed   By: Rolla Flatten M.D.   On: 05/30/2014 08:48   Dg Chest Port 1 View  05/29/2014   CLINICAL DATA:  Shortness of breath and altered mental status. History of non-Hodgkin's lymphoma, diabetes, atrial fibrillation, B-cell lymphoma, CVA.  EXAM: PORTABLE CHEST - 1 VIEW  COMPARISON:  09/16/2013  FINDINGS: Shallow inspiration. The heart size and mediastinal contours are within normal limits. Both lungs are clear. The visualized skeletal structures are unremarkable.  IMPRESSION: No active disease.    Electronically Signed   By: Lucienne Capers M.D.   On: 05/29/2014 21:15    Scheduled Meds: . acidophilus  1 capsule Oral Daily  . aspirin  325 mg Oral Daily  . cefTRIAXone (ROCEPHIN)  IV  1 g Intravenous Q24H  . clopidogrel  75 mg Oral QPC breakfast  . heparin  5,000 Units Subcutaneous 3 times per day  . insulin glargine  40 Units Subcutaneous Daily  . saccharomyces boulardii  250 mg Oral BID  . simvastatin  20 mg Oral QHS  . sodium chloride  3 mL Intravenous Q12H   Continuous Infusions: . sodium chloride 75 mL/hr at 05/30/14 [place],   [facility] Pager [phone]. If 8PM-8AM, please contact night-coverage at [email or web address], password [facility] 05/30/2014, 1:03 PM  LOS: 1 day

## 2014-05-31 LAB — COMPREHENSIVE METABOLIC PANEL
ALK PHOS: 70 U/L (ref 39–117)
ALT: 63 U/L — ABNORMAL HIGH (ref 0–53)
ANION GAP: 10 (ref 5–15)
AST: 129 U/L — ABNORMAL HIGH (ref 0–37)
Albumin: 2.4 g/dL — ABNORMAL LOW (ref 3.5–5.2)
BUN: 18 mg/dL (ref 6–23)
CO2: 25 mEq/L (ref 19–32)
Calcium: 8.5 mg/dL (ref 8.4–10.5)
Chloride: 102 mEq/L (ref 96–112)
Creatinine, Ser: 0.66 mg/dL (ref 0.50–1.35)
GFR calc Af Amer: >90 mL/min (ref >90)
GFR calc non Af Amer: >90 mL/min (ref >90)
GLUCOSE: 149 mg/dL — AB (ref 70–99)
POTASSIUM: 4 meq/L (ref 3.7–5.3)
Sodium: 137 mEq/L (ref 137–147)
TOTAL PROTEIN: 5.6 g/dL — AB (ref 6.0–8.3)
Total Bilirubin: 0.3 mg/dL (ref 0.3–1.2)

## 2014-05-31 LAB — GLUCOSE, CAPILLARY
Glucose-Capillary: 191 mg/dL — ABNORMAL HIGH (ref 70–99)
Glucose-Capillary: 182 mg/dL — ABNORMAL HIGH (ref 70–99)
Glucose-Capillary: 186 mg/dL — ABNORMAL HIGH (ref 70–99)

## 2014-05-31 LAB — CBC WITH DIFFERENTIAL/PLATELET
BASOS ABS: 0 10*3/uL (ref 0.0–0.1)
BASOS PCT: 0 % (ref 0–1)
Eosinophils Absolute: 0 10*3/uL (ref 0.0–0.7)
Eosinophils Relative: 0 % (ref 0–5)
HCT: 40.4 % (ref 39.0–52.0)
HEMOGLOBIN: 13.8 g/dL (ref 13.0–17.0)
Lymphocytes Relative: 5 % — ABNORMAL LOW (ref 12–46)
Lymphs Abs: 0.7 10*3/uL (ref 0.7–4.0)
MCH: 30.3 pg (ref 26.0–34.0)
MCHC: 34.2 g/dL (ref 30.0–36.0)
MCV: 88.6 fL (ref 78.0–100.0)
MONOS PCT: 11 % (ref 3–12)
Monocytes Absolute: 1.8 10*3/uL — ABNORMAL HIGH (ref 0.1–1.0)
NEUTROS ABS: 13.6 10*3/uL — AB (ref 1.7–7.7)
Neutrophils Relative %: 84 % — ABNORMAL HIGH (ref 43–77)
Platelets: 107 10*3/uL — ABNORMAL LOW (ref 150–400)
RBC: 4.56 MIL/uL (ref 4.22–5.81)
RDW: 13.3 % (ref 11.5–15.5)
WBC: 16.2 10*3/uL — ABNORMAL HIGH (ref 4.0–10.5)

## 2014-05-31 LAB — URINE CULTURE
COLONY COUNT: NO GROWTH
CULTURE: NO GROWTH

## 2014-05-31 MED ORDER — ASPIRIN 325 MG PO TABS
325.0000 mg | ORAL_TABLET | ORAL | Status: DC
  Administered 2014-06-01 – 2014-06-04 (×2): 325 mg via ORAL
  Filled 2014-05-31 (×2): qty 1

## 2014-05-31 NOTE — Progress Notes (Signed)
Patient most recent temp oral 100.4 degrees Farenheit.  Patient also complaining of heart burn at this time.  [facility] paged, K. Schorr was updated, new order for Tums received.

## 2014-05-31 NOTE — Progress Notes (Signed)
[facility] PROGRESS NOTE   Assessment/Plan: Acute Encephalopathy/Sepsis/UTI: - likely due to infectious process, toxic-metabolic etiologies - MRI no acute CVA. - vanc and cefepime empirically  -pan-culture negative till date.  IDDM - cont home lantus + SSI - A1C 7.7  HTN -BPs now High. - resume home meds.  hx/o Afib  - EKG NSR on presentation  - rate controlled - likely not anticoagulation candidate given hx/o falls   Hx/o CVA: - no focal deficits on exam  - MRI no acute CVA. - cont home regimen   7-hx/p lymphoma  -s/p resection  -in remission    Code Status: full Family Communication: wife and daughter  Disposition Plan: inpatient   Consultants:  none  Procedures:  CT head  MRI brain  Antibiotics:  vanc and cefepime  HPI/Subjective: Dysuria improved.  Objective: Filed Vitals:   05/30/14 2001 05/31/14 0240 05/31/14 0500 05/31/14 0953  BP:  121/47 126/46 150/48  Pulse: 123 99 95   Temp: 100.4 F (38 C) 98.8 F (37.1 C) 98 F (36.7 C)   TempSrc: Oral Oral Oral   Resp: 22  20   Height:      Weight:   92.171 kg (203 lb 3.2 oz)   SpO2: 99% 97% 98%     Intake/Output Summary (Last 24 hours) at 05/31/14 1236 Last data filed at 05/31/14 0640  Gross per 24 hour  Intake    360 ml  Output   1025 ml  Net   -665 ml   Filed Weights   05/30/14 0117 05/31/14 0500  Weight: 94.076 kg (207 lb 6.4 oz) 92.171 kg (203 lb 3.2 oz)    Exam:  General: Alert, awake, oriented x3, in no acute distress.  HEENT: No bruits, no goiter.  Heart: Regular rate and rhythm. Lungs: Good air movement, clear Abdomen: Soft, nontender, nondistended, positive bowel sounds.  Neuro: Grossly intact, nonfocal.   Data Reviewed: Basic Metabolic Panel:  Recent Labs Lab 05/29/14 2004 05/30/14 0136 05/31/14 0628  NA 132* 136* 137  K 4.5 4.3 4.0  CL 93* 104 102  CO2 21 19 25   GLUCOSE 238* 170* 149*  BUN 17 18 18   CREATININE 0.77 0.71 0.66    CALCIUM 8.6 7.6* 8.5   Liver Function Tests:  Recent Labs Lab 05/29/14 2004 05/30/14 0136 05/31/14 0628  AST 17 120* 129*  ALT 15 33 63*  ALKPHOS 76 62 70  BILITOT 0.4 0.4 0.3  PROT 6.7 5.5* 5.6*  ALBUMIN 3.0* 2.5* 2.4*   No results for input(s): LIPASE, AMYLASE in the last 168 hours.  Recent Labs Lab 05/29/14 2004  AMMONIA 36   CBC:  Recent Labs Lab 05/29/14 2210 05/31/14 0628  WBC 21.2* 16.2*  NEUTROABS 18.6* 13.6*  HGB 14.2 13.8  HCT 40.7 40.4  MCV 85.9 88.6  PLT 126* 107*   Cardiac Enzymes: No results for input(s): CKTOTAL, CKMB, CKMBINDEX, TROPONINI in the last 168 hours. BNP (last 3 results) No results for input(s): PROBNP in the last 8760 hours. CBG:  Recent Labs Lab 05/30/14 1810 05/31/14 1150  GLUCAP 245* 191*    Recent Results (from the past 240 hour(s))  Blood Culture (routine x 2)     Status: None (Preliminary result)   Collection Time: 05/29/14  8:40 PM  Result Value Ref Range Status   Specimen Description BLOOD ARM LEFT  Final   Special Requests BOTTLES DRAWN AEROBIC AND ANAEROBIC 10CC  Final   Culture  Setup Time   Final  05/30/2014 00:57 Performed at [facility]    Culture   Final           BLOOD CULTURE RECEIVED NO GROWTH TO DATE CULTURE WILL BE HELD FOR 5 DAYS BEFORE ISSUING A FINAL NEGATIVE REPORT Performed at [facility]    Report Status PENDING  Incomplete  Blood Culture (routine x 2)     Status: None (Preliminary result)   Collection Time: 05/29/14  8:50 PM  Result Value Ref Range Status   Specimen Description BLOOD ARM RIGHT  Final   Special Requests BOTTLES DRAWN AEROBIC AND ANAEROBIC 10CC  Final   Culture  Setup Time   Final    05/30/2014 00:58 Performed at [facility]    Culture   Final           BLOOD CULTURE RECEIVED NO GROWTH TO DATE CULTURE WILL BE HELD FOR 5 DAYS BEFORE ISSUING A FINAL NEGATIVE REPORT Performed at [facility]    Report Status PENDING  Incomplete  Urine  culture     Status: None   Collection Time: 05/29/14  9:25 PM  Result Value Ref Range Status   Specimen Description URINE, CATHETERIZED  Final   Special Requests NONE  Final   Culture  Setup Time   Final    05/30/2014 04:14 Performed at [place] Performed at [facility]   Final   Culture NO GROWTH Performed at [facility]   Final   Report Status 05/31/2014 FINAL  Final     Studies: Ct Head Wo Contrast  05/29/2014   CLINICAL DATA:  Found unresponsive but white, apneic at EMS arrival, response to pain, difficulty controlling breathing, can osseous during imaging, stroke, partial history hypertension, diabetes, B-cell lymphoma, Charcot-Marie-Tooth syndrome  EXAM: CT HEAD WITHOUT CONTRAST  CT CERVICAL SPINE WITHOUT CONTRAST  TECHNIQUE: Multidetector CT imaging of the head and cervical spine was performed following the standard protocol without intravenous contrast. Multiplanar CT image reconstructions of the cervical spine were also generated.  COMPARISON:  CT head 09/16/2013, CT neck 10/07/2012  FINDINGS: CT HEAD FINDINGS  Generalized atrophy.  Normal ventricular morphology.  No midline shift or mass effect.  Small vessel chronic ischemic changes of deep cerebral white matter.  No intracranial hemorrhage, mass lesion, or acute infarction.  Visualized paranasal sinuses and mastoid air cells clear.  Bones demineralized.  Atherosclerotic calcifications of internal carotid and vertebral arteries at skullbase.  CT CERVICAL SPINE FINDINGS  Scattered atherosclerotic calcifications greatest at the carotid bifurcations.  Visualized skullbase intact.  Diffuse motion artifact severely limits exam.  Disc space narrowing at C6-C7.  Prevertebral soft tissues normal thickness.  No gross evidence of fracture or subluxation identified within significant limitations of motion.  Lung apices clear.  IMPRESSION: Atrophy with small vessel chronic ischemic changes of  deep cerebral white matter.  No acute intracranial abnormalities.  Severely limited assessment of the cervical spine secondary to motion.  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  No gross acute cervical spine abnormality identified on limited exam.   Electronically Signed   By: Lavonia Dana M.D.   On: 05/29/2014 20:36   Ct Cervical Spine Wo Contrast  05/29/2014   CLINICAL DATA:  Found unresponsive but white, apneic at EMS arrival, response to pain, difficulty controlling breathing, can osseous during imaging, stroke, partial history hypertension, diabetes, B-cell lymphoma, Charcot-Marie-Tooth syndrome  EXAM: CT HEAD WITHOUT CONTRAST  CT CERVICAL SPINE WITHOUT CONTRAST  TECHNIQUE: Multidetector CT imaging of the head and cervical spine was performed following the standard protocol without intravenous contrast. Multiplanar CT image reconstructions of the cervical spine were also generated.  COMPARISON:  CT head 09/16/2013, CT neck 10/07/2012  FINDINGS: CT HEAD FINDINGS  Generalized atrophy.  Normal ventricular morphology.  No midline shift or mass effect.  Small vessel chronic ischemic changes of deep cerebral white matter.  No intracranial hemorrhage, mass lesion, or acute infarction.  Visualized paranasal sinuses and mastoid air cells clear.  Bones demineralized.  Atherosclerotic calcifications of internal carotid and vertebral arteries at skullbase.  CT CERVICAL SPINE FINDINGS  Scattered atherosclerotic calcifications greatest at the carotid bifurcations.  Visualized skullbase intact.  Diffuse motion artifact severely limits exam.  Disc space narrowing at C6-C7.  Prevertebral soft tissues normal thickness.  No gross evidence of fracture or subluxation identified within significant limitations of motion.  Lung apices clear.  IMPRESSION: Atrophy with small vessel chronic ischemic changes of deep cerebral white matter.  No acute intracranial abnormalities.  Severely limited assessment of the cervical spine  secondary to motion.  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  No gross acute cervical spine abnormality identified on limited exam.   Electronically Signed   By: Lavonia Dana M.D.   On: 05/29/2014 20:36   Mr Brain Wo Contrast  05/30/2014   CLINICAL DATA:  Encephalopathy, sepsis, hypertension and diabetes, altered mental status.  EXAM: MRI HEAD WITHOUT CONTRAST  TECHNIQUE: Multiplanar, multiecho pulse sequences of the brain and surrounding structures were obtained without intravenous contrast.  COMPARISON:  CT head 05/29/2014.  FINDINGS: No areas of acute stroke or hemorrhage. Extensive cerebral and cerebellar atrophy. Scattered areas of remote brainstem and cerebellar infarction. Extensive chronic microvascular ischemic change throughout the periventricular and subcortical white matter. Chronic LEFT occipital microbleed, likely sequelae of hypertensive cerebrovascular disease. Flow voids are maintained. No acute sinus or mastoid disease. Mild pannus. No osseous lesion.  IMPRESSION: No acute abnormality.  Chronic changes as described.   Electronically Signed   By: Rolla Flatten M.D.   On: 05/30/2014 08:48   Dg Chest Port 1 View  05/29/2014   CLINICAL DATA:  Shortness of breath and altered mental status. History of non-Hodgkin's lymphoma, diabetes, atrial fibrillation, B-cell lymphoma, CVA.  EXAM: PORTABLE CHEST - 1 VIEW  COMPARISON:  09/16/2013  FINDINGS: Shallow inspiration. The heart size and mediastinal contours are within normal limits. Both lungs are clear. The visualized skeletal structures are unremarkable.  IMPRESSION: No active disease.   Electronically Signed   By: Lucienne Capers M.D.   On: 05/29/2014 21:15    Scheduled Meds: . acidophilus  1 capsule Oral Daily  . amLODipine  5 mg Oral Daily  . [START ON 06/01/2014] aspirin  325 mg Oral Q M,W,F  . cefTRIAXone (ROCEPHIN)  IV  1 g Intravenous Q24H  . clopidogrel  75 mg Oral QPC breakfast  . heparin  5,000 Units Subcutaneous 3 times per day   . insulin glargine  40 Units Subcutaneous Daily  . lisinopril  20 mg Oral Daily  . metFORMIN  500 mg Oral Q breakfast  . metoprolol tartrate  25 mg Oral BID  . saccharomyces boulardii  250 mg Oral BID  . simvastatin  20 mg Oral QHS  . sodium chloride  3 mL Intravenous Q12H  . tamsulosin  0.4 mg Oral Daily   Continuous Infusions: . sodium chloride 10 mL/hr at 05/30/14 [place], Ronen Bromwell  [facility] Pager [phone].  If 8PM-8AM, please contact night-coverage at [email or web address], password [facility] 05/31/2014, 12:36 PM  LOS: 2 days

## 2014-05-31 NOTE — Progress Notes (Signed)
RN assisted patient to stand beside bed to use urinal, increased shortness of breath with exertion noted, continues to use accessory muscles to breath.  Patient lungs remain clear in the upper lobes bilaterally and diminished in the lower lobes bilaterally.  02 greater than 95% on 2L nasal cannula.  [facility] paged, K. Schorr returned page and was updated, no new orders received at this time.

## 2014-05-31 NOTE — Care Management Note (Signed)
    Page 1 of 1   06/04/2014     10:23:58 AM CARE MANAGEMENT NOTE 06/04/2014  Patient:  [name], [name]   Account Number:  [id number]  Date Initiated:  05/31/2014  Documentation initiated by:  GRAVES-BIGELOW,Ladaija Dimino  Subjective/Objective Assessment:   Pt admitted for encephalopathy, UTI and sepsis. Pt is from home with wife and support of daughter.     Action/Plan:   PT to consult. CM will continue to monitor for disposition needs.   Anticipated DC Date:  06/02/2014   Anticipated DC Plan:  [place]  CM consult  Patient refused services      [facility] Choice  HOME HEALTH   Choice offered to / List presented to:  C-1 Patient           Status of service:  Completed, signed off Medicare Important Message given?  YES (If response is "NO", the following Medicare IM given date fields will be blank) Date Medicare IM given:  05/31/2014 Medicare IM given by:  GRAVES-BIGELOW,Audiel Scheiber Date Additional Medicare IM given:  06/04/2014 Additional Medicare IM given by:  Henning Ehle GRAVES-BIGELOW  Discharge Disposition:  HOME/SELF CARE  Per UR Regulation:  Reviewed for med. necessity/level of care/duration of stay  If discussed at [place] of Stay Meetings, dates discussed:    Comments:   06-04-14 [place], RN,BSN [phone] Pt refusing [place] services at this time. No further needs from CM.  06/01/2014 1200 NCM spoke to pt and states he had [facility] in the past for [facility]. States he wants to see how well he does with ambulating in the hall before deciding on [facility] PT. States he had [facility] after he had his CVA. He uses his cane all the time. Has RW and wheelchair at home. Wife at home to assist with his care. Waiting final recommendations for home and orders for HHPT if pt agrees. Jonnie Finner RN CCM Case Mgmt phone [phone]

## 2014-06-01 DIAGNOSIS — L03113 Cellulitis of right upper limb: Secondary | ICD-10-CM

## 2014-06-01 LAB — COMPREHENSIVE METABOLIC PANEL
ALT: 69 U/L — ABNORMAL HIGH (ref 0–53)
AST: 102 U/L — ABNORMAL HIGH (ref 0–37)
Albumin: 2.3 g/dL — ABNORMAL LOW (ref 3.5–5.2)
Alkaline Phosphatase: 65 U/L (ref 39–117)
Anion gap: 13 (ref 5–15)
BUN: 21 mg/dL (ref 6–23)
CALCIUM: 8.3 mg/dL — AB (ref 8.4–10.5)
CO2: 25 meq/L (ref 19–32)
CREATININE: 0.54 mg/dL (ref 0.50–1.35)
Chloride: 100 mEq/L (ref 96–112)
GLUCOSE: 127 mg/dL — AB (ref 70–99)
Potassium: 3.6 mEq/L — ABNORMAL LOW (ref 3.7–5.3)
Sodium: 138 mEq/L (ref 137–147)
Total Bilirubin: 0.3 mg/dL (ref 0.3–1.2)
Total Protein: 5.5 g/dL — ABNORMAL LOW (ref 6.0–8.3)

## 2014-06-01 LAB — CBC WITH DIFFERENTIAL/PLATELET
BASOS ABS: 0 10*3/uL (ref 0.0–0.1)
Basophils Relative: 0 % (ref 0–1)
Eosinophils Absolute: 0.3 10*3/uL (ref 0.0–0.7)
Eosinophils Relative: 2 % (ref 0–5)
HCT: 39 % (ref 39.0–52.0)
Hemoglobin: 12.9 g/dL — ABNORMAL LOW (ref 13.0–17.0)
LYMPHS ABS: 0.9 10*3/uL (ref 0.7–4.0)
LYMPHS PCT: 7 % — AB (ref 12–46)
MCH: 28.6 pg (ref 26.0–34.0)
MCHC: 33.1 g/dL (ref 30.0–36.0)
MCV: 86.5 fL (ref 78.0–100.0)
Monocytes Absolute: 1.5 10*3/uL — ABNORMAL HIGH (ref 0.1–1.0)
Monocytes Relative: 12 % (ref 3–12)
Neutro Abs: 10.2 10*3/uL — ABNORMAL HIGH (ref 1.7–7.7)
Neutrophils Relative %: 79 % — ABNORMAL HIGH (ref 43–77)
PLATELETS: 122 10*3/uL — AB (ref 150–400)
RBC: 4.51 MIL/uL (ref 4.22–5.81)
RDW: 13.2 % (ref 11.5–15.5)
WBC: 12.9 10*3/uL — AB (ref 4.0–10.5)

## 2014-06-01 LAB — GLUCOSE, CAPILLARY
Glucose-Capillary: 95 mg/dL (ref 70–99)
Glucose-Capillary: 111 mg/dL — ABNORMAL HIGH (ref 70–99)
Glucose-Capillary: 173 mg/dL — ABNORMAL HIGH (ref 70–99)
Glucose-Capillary: 204 mg/dL — ABNORMAL HIGH (ref 70–99)

## 2014-06-01 MED ORDER — INSULIN GLARGINE 100 UNIT/ML ~~LOC~~ SOLN
40.0000 [IU] | Freq: Every day | SUBCUTANEOUS | Status: DC
  Administered 2014-06-01 – 2014-06-04 (×4): 40 [IU] via SUBCUTANEOUS
  Filled 2014-06-01 (×7): qty 0.4

## 2014-06-01 MED ORDER — CEFUROXIME AXETIL 500 MG PO TABS
500.0000 mg | ORAL_TABLET | Freq: Two times a day (BID) | ORAL | Status: DC
  Administered 2014-06-01: 500 mg via ORAL
  Filled 2014-06-01 (×3): qty 1

## 2014-06-01 MED ORDER — VANCOMYCIN HCL IN DEXTROSE 1-5 GM/200ML-% IV SOLN
1000.0000 mg | Freq: Two times a day (BID) | INTRAVENOUS | Status: DC
  Administered 2014-06-01 – 2014-06-04 (×6): 1000 mg via INTRAVENOUS
  Filled 2014-06-01 (×7): qty 200

## 2014-06-01 MED ORDER — CEFTRIAXONE SODIUM IN DEXTROSE 20 MG/ML IV SOLN
1.0000 g | INTRAVENOUS | Status: DC
  Administered 2014-06-01 – 2014-06-03 (×3): 1 g via INTRAVENOUS
  Filled 2014-06-01 (×4): qty 50

## 2014-06-01 MED ORDER — CEFTRIAXONE SODIUM IN DEXTROSE 20 MG/ML IV SOLN
1.0000 g | INTRAVENOUS | Status: DC
  Filled 2014-06-01: qty 50

## 2014-06-01 NOTE — Progress Notes (Signed)
[facility] PROGRESS NOTE   Assessment/Plan: Acute Encephalopathy/Sepsis/right arm cellulitis - likely due to infectious process, toxic-metabolic etiologies - MRI no acute CVA. - right arm cellulitis appears worst. - resume vanc and rocephin.  IDDM - cont home lantus + SSI - A1C 7.7  HTN -BPs now High. - resume home meds.  hx/o Afib  - EKG NSR on presentation  - rate controlled - likely not anticoagulation candidate given hx/o falls     Code Status: full Family Communication: wife and daughter  Disposition Plan: inpatient   Consultants:  none  Procedures:  CT head  MRI brain  Antibiotics:  vanc and cefepime  HPI/Subjective: Dysuria improved.  Objective: Filed Vitals:   05/31/14 2040 06/01/14 0500 06/01/14 1028 06/01/14 1031  BP: 117/47 113/42 128/61   Pulse: 94 77  98  Temp: 98.9 F (37.2 C) 97.7 F (36.5 C)    TempSrc: Oral Oral    Resp: 18 18    Height:      Weight:  91.218 kg (201 lb 1.6 oz)    SpO2: 96% 95%      Intake/Output Summary (Last 24 hours) at 06/01/14 1052 Last data filed at 06/01/14 0830  Gross per 24 hour  Intake    500 ml  Output    450 ml  Net     50 ml   Filed Weights   05/30/14 0117 05/31/14 0500 06/01/14 0500  Weight: 94.076 kg (207 lb 6.4 oz) 92.171 kg (203 lb 3.2 oz) 91.218 kg (201 lb 1.6 oz)    Exam:  General: Alert, awake, oriented x3, in no acute distress.  HEENT: No bruits, no goiter.  Heart: Regular rate and rhythm. Lungs: Good air movement, clear Abdomen: Soft, nontender, nondistended, positive bowel sounds.  Skin: right arm is warm and indurated.   Data Reviewed: Basic Metabolic Panel:  Recent Labs Lab 05/29/14 2004 05/30/14 0136 05/31/14 0628 06/01/14 0344  NA 132* 136* 137 138  K 4.5 4.3 4.0 3.6*  CL 93* 104 102 100  CO2 21 19 25 25   GLUCOSE 238* 170* 149* 127*  BUN 17 18 18 21   CREATININE 0.77 0.71 0.66 0.54  CALCIUM 8.6 7.6* 8.5 8.3*   Liver Function  Tests:  Recent Labs Lab 05/29/14 2004 05/30/14 0136 05/31/14 0628 06/01/14 0344  AST 17 120* 129* 102*  ALT 15 33 63* 69*  ALKPHOS 76 62 70 65  BILITOT 0.4 0.4 0.3 0.3  PROT 6.7 5.5* 5.6* 5.5*  ALBUMIN 3.0* 2.5* 2.4* 2.3*   No results for input(s): LIPASE, AMYLASE in the last 168 hours.  Recent Labs Lab 05/29/14 2004  AMMONIA 36   CBC:  Recent Labs Lab 05/29/14 2210 05/31/14 0628 06/01/14 0344  WBC 21.2* 16.2* 12.9*  NEUTROABS 18.6* 13.6* 10.2*  HGB 14.2 13.8 12.9*  HCT 40.7 40.4 39.0  MCV 85.9 88.6 86.5  PLT 126* 107* 122*   Cardiac Enzymes: No results for input(s): CKTOTAL, CKMB, CKMBINDEX, TROPONINI in the last 168 hours. BNP (last 3 results) No results for input(s): PROBNP in the last 8760 hours. CBG:  Recent Labs Lab 05/30/14 1810 05/31/14 1150 05/31/14 1618 05/31/14 2149 06/01/14 0752  GLUCAP 245* 191* 182* 186* 95    Recent Results (from the past 240 hour(s))  Blood Culture (routine x 2)     Status: None (Preliminary result)   Collection Time: 05/29/14  8:40 PM  Result Value Ref Range Status   Specimen Description BLOOD ARM LEFT  Final   Special  Requests BOTTLES DRAWN AEROBIC AND ANAEROBIC 10CC  Final   Culture  Setup Time   Final    05/30/2014 00:57 Performed at [facility]    Culture   Final           BLOOD CULTURE RECEIVED NO GROWTH TO DATE CULTURE WILL BE HELD FOR 5 DAYS BEFORE ISSUING A FINAL NEGATIVE REPORT Performed at [facility]    Report Status PENDING  Incomplete  Blood Culture (routine x 2)     Status: None (Preliminary result)   Collection Time: 05/29/14  8:50 PM  Result Value Ref Range Status   Specimen Description BLOOD ARM RIGHT  Final   Special Requests BOTTLES DRAWN AEROBIC AND ANAEROBIC 10CC  Final   Culture  Setup Time   Final    05/30/2014 00:58 Performed at [facility]    Culture   Final           BLOOD CULTURE RECEIVED NO GROWTH TO DATE CULTURE WILL BE HELD FOR 5 DAYS BEFORE ISSUING A  FINAL NEGATIVE REPORT Performed at [facility]    Report Status PENDING  Incomplete  Urine culture     Status: None   Collection Time: 05/29/14  9:25 PM  Result Value Ref Range Status   Specimen Description URINE, CATHETERIZED  Final   Special Requests NONE  Final   Culture  Setup Time   Final    05/30/2014 04:14 Performed at [place] Performed at [facility]   Final   Culture NO GROWTH Performed at [facility]   Final   Report Status 05/31/2014 FINAL  Final     Studies: No results found.  Scheduled Meds: . acidophilus  1 capsule Oral Daily  . amLODipine  5 mg Oral Daily  . aspirin  325 mg Oral Q M,W,F  . cefTRIAXone (ROCEPHIN)  IV  1 g Intravenous Q24H  . clopidogrel  75 mg Oral QPC breakfast  . heparin  5,000 Units Subcutaneous 3 times per day  . insulin glargine  40 Units Subcutaneous Daily  . lisinopril  20 mg Oral Daily  . metFORMIN  500 mg Oral Q breakfast  . metoprolol tartrate  25 mg Oral BID  . saccharomyces boulardii  250 mg Oral BID  . simvastatin  20 mg Oral QHS  . sodium chloride  3 mL Intravenous Q12H  . tamsulosin  0.4 mg Oral Daily   Continuous Infusions: . sodium chloride 10 mL/hr at 05/30/14 [place], Makail Watling  [facility] Pager [phone]. If 8PM-8AM, please contact night-coverage at [email or web address], password [facility] 06/01/2014, 10:52 AM  LOS: 3 days

## 2014-06-01 NOTE — Evaluation (Signed)
Physical Therapy Evaluation Patient Details Name: [name] MRN: [id number] DOB: 1940-10-07 Today's Date: 06/01/2014   History of Present Illness  This is a 73 y.o. year old male with significant past medical history of CVA, IDDM, recurrent falls, hx/o afib, hx/o lymphoma s/p resection in remission presenting with encephalopathy, UTI, sepsis, fall.  Clinical Impression  Pt has made good progress toward reaching his baseline functioning during his stay.  Pt to ambulated in halls with nursing staff while on acute and have PT follow up at home.  Will sign off at this time.    Follow Up Recommendations Home health PT    Equipment Recommendations  None recommended by PT    Recommendations for Other Services       Precautions / Restrictions Precautions Precautions: Fall      Mobility  Bed Mobility Overal bed mobility: Needs Assistance Bed Mobility: Supine to Sit     Supine to sit: Min assist (without rail)     General bed mobility comments: mild struggle to get OOB without rail  Transfers Overall transfer level: Needs assistance Equipment used: Rolling walker (2 wheeled) Transfers: Sit to/from Stand Sit to Stand: Min guard         General transfer comment: no assist needed, safe technique  Ambulation/Gait Ambulation/Gait assistance: Supervision Ambulation Distance (Feet): 300 Feet Assistive device: Rolling walker (2 wheeled);None Gait Pattern/deviations: Step-through pattern Gait velocity: slower   General Gait Details: generally steady with RW, but mild wandering.  Worse wandering to catch balance without assistive device.  Stairs            Wheelchair Mobility    Modified Rankin (Stroke Patients Only)       Balance Overall balance assessment: Needs assistance Sitting-balance support: No upper extremity supported Sitting balance-Leahy Scale: Good     Standing balance support: No upper extremity supported Standing balance-Leahy Scale:  Fair                               Pertinent Vitals/Pain Pain Assessment: Faces Faces Pain Scale: Hurts even more Pain Location: R arm area of inflammation Pain Descriptors / Indicators: Aching;Burning Pain Intervention(s): Limited activity within patient's tolerance    Home Living Family/patient expects to be discharged to:: Private residence Living Arrangements: Spouse/significant other Available Help at Discharge: Family;Available 24 hours/day Type of Home: House Home Access: Stairs to enter;Ramped entrance Entrance Stairs-Rails:  (handle) Entrance Stairs-Number of Steps: 2 Home Layout: One level Home Equipment: Walker - 2 wheels;Cane - single point      Prior Function Level of Independence: Independent               Hand Dominance        Extremity/Trunk Assessment   Upper Extremity Assessment: Overall WFL for tasks assessed;RUE deficits/detail RUE Deficits / Details: mobility functional , but painful at Pna shot site         Lower Extremity Assessment: Generalized weakness;Overall WFL for tasks assessed         Communication   Communication: No difficulties  Cognition Arousal/Alertness: Awake/alert Behavior During Therapy: WFL for tasks assessed/performed Overall Cognitive Status: Within Functional Limits for tasks assessed                      General Comments      Exercises        Assessment/Plan    PT Assessment All further PT needs can be met  in the next venue of care  PT Diagnosis Abnormality of gait;Generalized weakness   PT Problem List Decreased strength;Decreased activity tolerance;Decreased balance;Decreased knowledge of use of DME  PT Treatment Interventions     PT Goals (Current goals can be found in the Care Plan section) Acute Rehab PT Goals PT Goal Formulation: All assessment and education complete, DC therapy    Frequency     Barriers to discharge        Co-evaluation               End of  Session   Activity Tolerance: Patient tolerated treatment well Patient left: in bed;with call bell/phone within reach (sitting EOB) Nurse Communication: Mobility status         Time: 3295-1884 PT Time Calculation (min) (ACUTE ONLY): 28 min   Charges:   PT Evaluation $Initial PT Evaluation Tier I: 1 Procedure PT Treatments $Gait Training: 8-22 mins   PT G Codes:          [name], Tessie Fass 06/01/2014, 10:40 AM 06/01/2014  Donnella Sham, PT [phone]  (pager)

## 2014-06-01 NOTE — Progress Notes (Signed)
ANTIBIOTIC CONSULT NOTE - INITIAL  Pharmacy Consult for Vancomycin  Indication: Cellulitis, UTI  Allergies  Allergen Reactions  . Actos [Pioglitazone Hydrochloride] Other (See Comments)    Afib  . Hydrochlorothiazide Other (See Comments)    Lower potassium to low  . Penicillins Rash    Tolerated cefepime, ceftriaxone    Patient Measurements: ~98 kg  Vital Signs: Temp: 97.7 F (36.5 C) (12/11 0500) Temp Source: Oral (12/11 0500) BP: 128/61 mmHg (12/11 1028) Pulse Rate: 98 (12/11 1031)  Labs:  Recent Labs  05/29/14 2210 05/30/14 0136 05/31/14 0628 06/01/14 0344  WBC 21.2*  --  16.2* 12.9*  HGB 14.2  --  13.8 12.9*  PLT 126*  --  107* 122*  CREATININE  --  0.71 0.66 0.54   Medical History: Past Medical History  Diagnosis Date  . 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 Hypertension   . Diabetes mellitus   . A-fib   . Cataract   . Charcot-Marie-Tooth disease   . 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 Stroke     Assessment: Pt was started on vanc/cefepime then transition to rocephin but cellulitis looks worse so MD has put him back on vanc/rocephin this AM.    Goal of Therapy:  Vanc trough 10-15  Plan:   Vancomycin 1000 mg IV q12h Vanc trough if needed  Onnie Boer, PharmD Pager: [phone] 06/01/2014 11:27 AM

## 2014-06-01 NOTE — Progress Notes (Signed)
CARE MANAGEMENT NOTE 06/01/2014  Patient:  [name], [name]   Account Number:  [id number]  Date Initiated:  05/31/2014  Documentation initiated by:  GRAVES-BIGELOW,BRENDA  Subjective/Objective Assessment:   Pt admitted for encephalopathy, UTI and sepsis. Pt is from home with wife and support of daughter.     Action/Plan:   PT to consult. CM will continue to monitor for disposition needs.   Anticipated DC Date:  06/02/2014   Anticipated DC Plan:  [place]  CM consult      [facility] Choice  HOME HEALTH   Choice offered to / List presented to:  C-1 Patient           Status of service:   Medicare Important Message given?  YES (If response is "NO", the following Medicare IM given date fields will be blank) Date Medicare IM given:  05/31/2014 Medicare IM given by:  GRAVES-BIGELOW,BRENDA Date Additional Medicare IM given:   Additional Medicare IM given by:    Discharge Disposition:    Per UR Regulation:  Reviewed for med. necessity/level of care/duration of stay  If discussed at [place] of Stay Meetings, dates discussed:    Comments:  06/01/2014 1200 NCM spoke to pt and states he had [facility] in the past for [facility]. States he wants to see how well he does with ambulating in the hall before deciding on [facility] PT. 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 He uses his cane all the time. Has RW and wheelchair at home. Wife at home to assist with his care. Waiting final recommendations for home and orders for HHPT if pt agrees. Jonnie Finner RN CCM Case Mgmt phone [phone]

## 2014-06-02 DIAGNOSIS — IMO0001 Reserved for inherently not codable concepts without codable children: Secondary | ICD-10-CM

## 2014-06-02 DIAGNOSIS — L03113 Cellulitis of right upper limb: Secondary | ICD-10-CM | POA: Diagnosis present

## 2014-06-02 DIAGNOSIS — A419 Sepsis, unspecified organism: Secondary | ICD-10-CM | POA: Insufficient documentation

## 2014-06-02 LAB — COMPREHENSIVE METABOLIC PANEL
ALK PHOS: 61 U/L (ref 39–117)
ALT: 76 U/L — ABNORMAL HIGH (ref 0–53)
AST: 88 U/L — AB (ref 0–37)
Albumin: 2.1 g/dL — ABNORMAL LOW (ref 3.5–5.2)
Anion gap: 14 (ref 5–15)
BUN: 20 mg/dL (ref 6–23)
CO2: 22 meq/L (ref 19–32)
Calcium: 8.5 mg/dL (ref 8.4–10.5)
Chloride: 102 mEq/L (ref 96–112)
Creatinine, Ser: 0.53 mg/dL (ref 0.50–1.35)
GFR calc non Af Amer: >90 mL/min (ref >90)
GLUCOSE: 139 mg/dL — AB (ref 70–99)
POTASSIUM: 3.9 meq/L (ref 3.7–5.3)
Sodium: 138 mEq/L (ref 137–147)
Total Bilirubin: 0.2 mg/dL — ABNORMAL LOW (ref 0.3–1.2)
Total Protein: 5.4 g/dL — ABNORMAL LOW (ref 6.0–8.3)

## 2014-06-02 LAB — CBC WITH DIFFERENTIAL/PLATELET
BASOS PCT: 0 % (ref 0–1)
Basophils Absolute: 0 10*3/uL (ref 0.0–0.1)
EOS PCT: 3 % (ref 0–5)
Eosinophils Absolute: 0.4 10*3/uL (ref 0.0–0.7)
HEMATOCRIT: 39.3 % (ref 39.0–52.0)
Hemoglobin: 13.2 g/dL (ref 13.0–17.0)
LYMPHS ABS: 1 10*3/uL (ref 0.7–4.0)
Lymphocytes Relative: 9 % — ABNORMAL LOW (ref 12–46)
MCH: 28.8 pg (ref 26.0–34.0)
MCHC: 33.6 g/dL (ref 30.0–36.0)
MCV: 85.6 fL (ref 78.0–100.0)
MONO ABS: 1.5 10*3/uL — AB (ref 0.1–1.0)
Monocytes Relative: 14 % — ABNORMAL HIGH (ref 3–12)
NEUTROS PCT: 74 % (ref 43–77)
Neutro Abs: 7.8 10*3/uL — ABNORMAL HIGH (ref 1.7–7.7)
Platelets: 135 10*3/uL — ABNORMAL LOW (ref 150–400)
RBC: 4.59 MIL/uL (ref 4.22–5.81)
RDW: 13.1 % (ref 11.5–15.5)
WBC: 10.6 10*3/uL — ABNORMAL HIGH (ref 4.0–10.5)

## 2014-06-02 LAB — GLUCOSE, CAPILLARY
GLUCOSE-CAPILLARY: 181 mg/dL — AB (ref 70–99)
Glucose-Capillary: 89 mg/dL (ref 70–99)
Glucose-Capillary: 117 mg/dL — ABNORMAL HIGH (ref 70–99)

## 2014-06-02 NOTE — Progress Notes (Signed)
[facility] PROGRESS NOTE   Assessment/Plan: Acute Encephalopathy/Sepsis/right arm cellulitis - likely due to infectious process, toxic-metabolic etiologies - MRI no acute CVA. - right arm cellulitis improved, cont IV antibiotics for 4 days. - resume vanc and rocephin.  IDDM - cont home lantus + SSI - A1C 7.7  HTN -BPs now High. - resume home meds.  hx/o Afib  - EKG NSR on presentation  - rate controlled - likely not anticoagulation candidate given hx/o falls     Code Status: full Family Communication: wife and daughter  Disposition Plan: inpatient   Consultants:  none  Procedures:  CT head  MRI brain  Antibiotics:  vanc and cefepime  HPI/Subjective: Arm pain improved.  Objective: Filed Vitals:   06/01/14 1330 06/01/14 2002 06/02/14 0424 06/02/14 0953  BP: 122/47 113/49 133/56 147/58  Pulse: 76 92 76   Temp: 98.3 F (36.8 C) 97.9 F (36.6 C) 98.7 F (37.1 C)   TempSrc: Oral Oral Oral   Resp: 18 18 18    Height:      Weight:   92.579 kg (204 lb 1.6 oz)   SpO2: 98% 98% 97%     Intake/Output Summary (Last 24 hours) at 06/02/14 1101 Last data filed at 06/02/14 0600  Gross per 24 hour  Intake    900 ml  Output    625 ml  Net    275 ml   Filed Weights   05/31/14 0500 06/01/14 0500 06/02/14 0424  Weight: 92.171 kg (203 lb 3.2 oz) 91.218 kg (201 lb 1.6 oz) 92.579 kg (204 lb 1.6 oz)    Exam:  General: Alert, awake, oriented x3, in no acute distress.  HEENT: No bruits, no goiter.  Heart: Regular rate and rhythm. Lungs: Good air movement, clear Abdomen: Soft, nontender, nondistended, positive bowel sounds.  Skin: right arm is warm and indurated.   Data Reviewed: Basic Metabolic Panel:  Recent Labs Lab 05/29/14 2004 05/30/14 0136 05/31/14 0628 06/01/14 0344 06/02/14 0337  NA 132* 136* 137 138 138  K 4.5 4.3 4.0 3.6* 3.9  CL 93* 104 102 100 102  CO2 21 19 25 25 22   GLUCOSE 238* 170* 149* 127* 139*  BUN 17 18 18 21  20   CREATININE 0.77 0.71 0.66 0.54 0.53  CALCIUM 8.6 7.6* 8.5 8.3* 8.5   Liver Function Tests:  Recent Labs Lab 05/29/14 2004 05/30/14 0136 05/31/14 0628 06/01/14 0344 06/02/14 0337  AST 17 120* 129* 102* 88*  ALT 15 33 63* 69* 76*  ALKPHOS 76 62 70 65 61  BILITOT 0.4 0.4 0.3 0.3 0.2*  PROT 6.7 5.5* 5.6* 5.5* 5.4*  ALBUMIN 3.0* 2.5* 2.4* 2.3* 2.1*   No results for input(s): LIPASE, AMYLASE in the last 168 hours.  Recent Labs Lab 05/29/14 2004  AMMONIA 36   CBC:  Recent Labs Lab 05/29/14 2210 05/31/14 0628 06/01/14 0344 06/02/14 0337  WBC 21.2* 16.2* 12.9* 10.6*  NEUTROABS 18.6* 13.6* 10.2* 7.8*  HGB 14.2 13.8 12.9* 13.2  HCT 40.7 40.4 39.0 39.3  MCV 85.9 88.6 86.5 85.6  PLT 126* 107* 122* 135*   Cardiac Enzymes: No results for input(s): CKTOTAL, CKMB, CKMBINDEX, TROPONINI in the last 168 hours. BNP (last 3 results) No results for input(s): PROBNP in the last 8760 hours. CBG:  Recent Labs Lab 06/01/14 0752 06/01/14 1152 06/01/14 1629 06/01/14 2025 06/02/14 0755  GLUCAP 95 111* 173* 204* 89    Recent Results (from the past 240 hour(s))  Blood Culture (routine x 2)  Status: None (Preliminary result)   Collection Time: 05/29/14  8:40 PM  Result Value Ref Range Status   Specimen Description BLOOD ARM LEFT  Final   Special Requests BOTTLES DRAWN AEROBIC AND ANAEROBIC 10CC  Final   Culture  Setup Time   Final    05/30/2014 00:57 Performed at [facility]    Culture   Final           BLOOD CULTURE RECEIVED NO GROWTH TO DATE CULTURE WILL BE HELD FOR 5 DAYS BEFORE ISSUING A FINAL NEGATIVE REPORT Performed at [facility]    Report Status PENDING  Incomplete  Blood Culture (routine x 2)     Status: None (Preliminary result)   Collection Time: 05/29/14  8:50 PM  Result Value Ref Range Status   Specimen Description BLOOD ARM RIGHT  Final   Special Requests BOTTLES DRAWN AEROBIC AND ANAEROBIC 10CC  Final   Culture  Setup Time   Final      05/30/2014 00:58 Performed at [facility]    Culture   Final           BLOOD CULTURE RECEIVED NO GROWTH TO DATE CULTURE WILL BE HELD FOR 5 DAYS BEFORE ISSUING A FINAL NEGATIVE REPORT Performed at [facility]    Report Status PENDING  Incomplete  Urine culture     Status: None   Collection Time: 05/29/14  9:25 PM  Result Value Ref Range Status   Specimen Description URINE, CATHETERIZED  Final   Special Requests NONE  Final   Culture  Setup Time   Final    05/30/2014 04:14 Performed at [place] Performed at [facility]   Final   Culture NO GROWTH Performed at [facility]   Final   Report Status 05/31/2014 FINAL  Final     Studies: No results found.  Scheduled Meds: . acidophilus  1 capsule Oral Daily  . amLODipine  5 mg Oral Daily  . aspirin  325 mg Oral Q M,W,F  . cefTRIAXone (ROCEPHIN)  IV  1 g Intravenous Q24H  . clopidogrel  75 mg Oral QPC breakfast  . heparin  5,000 Units Subcutaneous 3 times per day  . insulin glargine  40 Units Subcutaneous Daily  . lisinopril  20 mg Oral Daily  . metFORMIN  500 mg Oral Q breakfast  . metoprolol tartrate  25 mg Oral BID  . saccharomyces boulardii  250 mg Oral BID  . simvastatin  20 mg Oral QHS  . sodium chloride  3 mL Intravenous Q12H  . tamsulosin  0.4 mg Oral Daily  . vancomycin  1,000 mg Intravenous Q12H   Continuous Infusions: . sodium chloride 10 mL/hr at 05/30/14 [place], Lynn Recendiz  [facility] Pager [phone]. If 8PM-8AM, please contact night-coverage at [email or web address], password [facility] 06/02/2014, 11:01 AM  LOS: 4 days

## 2014-06-03 LAB — CBC WITH DIFFERENTIAL/PLATELET
BASOS ABS: 0 10*3/uL (ref 0.0–0.1)
Basophils Relative: 0 % (ref 0–1)
EOS PCT: 4 % (ref 0–5)
Eosinophils Absolute: 0.4 10*3/uL (ref 0.0–0.7)
HCT: 38.6 % — ABNORMAL LOW (ref 39.0–52.0)
Hemoglobin: 13 g/dL (ref 13.0–17.0)
LYMPHS PCT: 12 % (ref 12–46)
Lymphs Abs: 1.2 10*3/uL (ref 0.7–4.0)
MCH: 28.6 pg (ref 26.0–34.0)
MCHC: 33.7 g/dL (ref 30.0–36.0)
MCV: 85 fL (ref 78.0–100.0)
Monocytes Absolute: 1.4 10*3/uL — ABNORMAL HIGH (ref 0.1–1.0)
Monocytes Relative: 13 % — ABNORMAL HIGH (ref 3–12)
NEUTROS ABS: 7.2 10*3/uL (ref 1.7–7.7)
Neutrophils Relative %: 71 % (ref 43–77)
PLATELETS: 158 10*3/uL (ref 150–400)
RBC: 4.54 MIL/uL (ref 4.22–5.81)
RDW: 12.9 % (ref 11.5–15.5)
WBC: 10.2 10*3/uL (ref 4.0–10.5)

## 2014-06-03 LAB — COMPREHENSIVE METABOLIC PANEL
ALBUMIN: 2.4 g/dL — AB (ref 3.5–5.2)
ALT: 77 U/L — ABNORMAL HIGH (ref 0–53)
ANION GAP: 12 (ref 5–15)
AST: 65 U/L — ABNORMAL HIGH (ref 0–37)
Alkaline Phosphatase: 72 U/L (ref 39–117)
BILIRUBIN TOTAL: 0.3 mg/dL (ref 0.3–1.2)
BUN: 19 mg/dL (ref 6–23)
CO2: 27 meq/L (ref 19–32)
CREATININE: 0.52 mg/dL (ref 0.50–1.35)
Calcium: 8.7 mg/dL (ref 8.4–10.5)
Chloride: 100 mEq/L (ref 96–112)
GFR calc Af Amer: >90 mL/min (ref >90)
GLUCOSE: 83 mg/dL (ref 70–99)
POTASSIUM: 3.3 meq/L — AB (ref 3.7–5.3)
Sodium: 139 mEq/L (ref 137–147)
Total Protein: 5.6 g/dL — ABNORMAL LOW (ref 6.0–8.3)

## 2014-06-03 LAB — GLUCOSE, CAPILLARY
GLUCOSE-CAPILLARY: 67 mg/dL — AB (ref 70–99)
GLUCOSE-CAPILLARY: 167 mg/dL — AB (ref 70–99)
Glucose-Capillary: 107 mg/dL — ABNORMAL HIGH (ref 70–99)
Glucose-Capillary: 205 mg/dL — ABNORMAL HIGH (ref 70–99)

## 2014-06-03 LAB — MAGNESIUM: Magnesium: 1.9 mg/dL (ref 1.5–2.5)

## 2014-06-03 MED ORDER — POTASSIUM CHLORIDE 10 MEQ/100ML IV SOLN
10.0000 meq | INTRAVENOUS | Status: AC
  Administered 2014-06-03 (×3): 10 meq via INTRAVENOUS
  Filled 2014-06-03: qty 100

## 2014-06-03 NOTE — Progress Notes (Signed)
[facility] PROGRESS NOTE   Assessment/Plan: Acute Encephalopathy/Sepsis/right arm cellulitis - likely due to infectious process, toxic-metabolic etiologies - MRI no acute CVA. - right arm cellulitis appears improved. - resume vanc and rocephin.  IDDM - cont home lantus + SSI - A1C 7.7  HTN -BPs now High. - resume home meds.  hx/o Afib  - EKG NSR on presentation  - rate controlled - likely not anticoagulation candidate given hx/o falls     Code Status: full Family Communication: wife and daughter  Disposition Plan: inpatient   Consultants:  none  Procedures:  CT head  MRI brain  Antibiotics:  vanc and cefepime  HPI/Subjective: Dysuria improved. Able to move arm today.  Objective: Filed Vitals:   06/02/14 0953 06/02/14 1357 06/02/14 2053 06/03/14 0522  BP: 147/58 121/45 127/54 135/65  Pulse:  79 84 78  Temp:  98.1 F (36.7 C) 98.5 F (36.9 C) 97.5 F (36.4 C)  TempSrc:  Oral Oral Oral  Resp:  18 18 20   Height:      Weight:    93.441 kg (206 lb)  SpO2:  99% 99% 95%    Intake/Output Summary (Last 24 hours) at 06/03/14 1019 Last data filed at 06/03/14 0900  Gross per 24 hour  Intake    460 ml  Output    650 ml  Net   -190 ml   Filed Weights   06/01/14 0500 06/02/14 0424 06/03/14 0522  Weight: 91.218 kg (201 lb 1.6 oz) 92.579 kg (204 lb 1.6 oz) 93.441 kg (206 lb)    Exam:  General: Alert, awake, oriented x3, in no acute distress.  HEENT: No bruits, no goiter.  Heart: Regular rate and rhythm. Lungs: Good air movement, clear Abdomen: Soft, nontender, nondistended, positive bowel sounds.  Skin: erythema receding.   Data Reviewed: Basic Metabolic Panel:  Recent Labs Lab 05/30/14 0136 05/31/14 0628 06/01/14 0344 06/02/14 0337 06/03/14 0300  NA 136* 137 138 138 139  K 4.3 4.0 3.6* 3.9 3.3*  CL 104 102 100 102 100  CO2 19 25 25 22 27   GLUCOSE 170* 149* 127* 139* 83  BUN 18 18 21 20 19   CREATININE 0.71 0.66 0.54  0.53 0.52  CALCIUM 7.6* 8.5 8.3* 8.5 8.7  MG  --   --   --   --  1.9   Liver Function Tests:  Recent Labs Lab 05/30/14 0136 05/31/14 0628 06/01/14 0344 06/02/14 0337 06/03/14 0300  AST 120* 129* 102* 88* 65*  ALT 33 63* 69* 76* 77*  ALKPHOS 62 70 65 61 72  BILITOT 0.4 0.3 0.3 0.2* 0.3  PROT 5.5* 5.6* 5.5* 5.4* 5.6*  ALBUMIN 2.5* 2.4* 2.3* 2.1* 2.4*   No results for input(s): LIPASE, AMYLASE in the last 168 hours.  Recent Labs Lab 05/29/14 2004  AMMONIA 36   CBC:  Recent Labs Lab 05/29/14 2210 05/31/14 0628 06/01/14 0344 06/02/14 0337 06/03/14 0300  WBC 21.2* 16.2* 12.9* 10.6* 10.2  NEUTROABS 18.6* 13.6* 10.2* 7.8* 7.2  HGB 14.2 13.8 12.9* 13.2 13.0  HCT 40.7 40.4 39.0 39.3 38.6*  MCV 85.9 88.6 86.5 85.6 85.0  PLT 126* 107* 122* 135* 158   Cardiac Enzymes: No results for input(s): CKTOTAL, CKMB, CKMBINDEX, TROPONINI in the last 168 hours. BNP (last 3 results) No results for input(s): PROBNP in the last 8760 hours. CBG:  Recent Labs Lab 06/01/14 2025 06/02/14 0755 06/02/14 1200 06/02/14 2028 06/03/14 0738  GLUCAP 204* 89 117* 181* 67*  Recent Results (from the past 240 hour(s))  Blood Culture (routine x 2)     Status: None (Preliminary result)   Collection Time: 05/29/14  8:40 PM  Result Value Ref Range Status   Specimen Description BLOOD ARM LEFT  Final   Special Requests BOTTLES DRAWN AEROBIC AND ANAEROBIC 10CC  Final   Culture  Setup Time   Final    05/30/2014 00:57 Performed at [facility]    Culture   Final           BLOOD CULTURE RECEIVED NO GROWTH TO DATE CULTURE WILL BE HELD FOR 5 DAYS BEFORE ISSUING A FINAL NEGATIVE REPORT Performed at [facility]    Report Status PENDING  Incomplete  Blood Culture (routine x 2)     Status: None (Preliminary result)   Collection Time: 05/29/14  8:50 PM  Result Value Ref Range Status   Specimen Description BLOOD ARM RIGHT  Final   Special Requests BOTTLES DRAWN AEROBIC AND  ANAEROBIC 10CC  Final   Culture  Setup Time   Final    05/30/2014 00:58 Performed at [facility]    Culture   Final           BLOOD CULTURE RECEIVED NO GROWTH TO DATE CULTURE WILL BE HELD FOR 5 DAYS BEFORE ISSUING A FINAL NEGATIVE REPORT Performed at [facility]    Report Status PENDING  Incomplete  Urine culture     Status: None   Collection Time: 05/29/14  9:25 PM  Result Value Ref Range Status   Specimen Description URINE, CATHETERIZED  Final   Special Requests NONE  Final   Culture  Setup Time   Final    05/30/2014 04:14 Performed at [place] Performed at [facility]   Final   Culture NO GROWTH Performed at [facility]   Final   Report Status 05/31/2014 FINAL  Final     Studies: No results found.  Scheduled Meds: . acidophilus  1 capsule Oral Daily  . amLODipine  5 mg Oral Daily  . aspirin  325 mg Oral Q M,W,F  . cefTRIAXone (ROCEPHIN)  IV  1 g Intravenous Q24H  . clopidogrel  75 mg Oral QPC breakfast  . heparin  5,000 Units Subcutaneous 3 times per day  . insulin glargine  40 Units Subcutaneous Daily  . lisinopril  20 mg Oral Daily  . metFORMIN  500 mg Oral Q breakfast  . metoprolol tartrate  25 mg Oral BID  . saccharomyces boulardii  250 mg Oral BID  . simvastatin  20 mg Oral QHS  . sodium chloride  3 mL Intravenous Q12H  . tamsulosin  0.4 mg Oral Daily  . vancomycin  1,000 mg Intravenous Q12H   Continuous Infusions: . sodium chloride 10 mL/hr at 05/30/14 [place], ABRAHAM  [facility] Pager [phone]. If 8PM-8AM, please contact night-coverage at [email or web address], password [facility] 06/03/2014, 10:19 AM  LOS: 5 days

## 2014-06-04 LAB — CBC WITH DIFFERENTIAL/PLATELET
Basophils Absolute: 0 10*3/uL (ref 0.0–0.1)
Basophils Relative: 0 % (ref 0–1)
EOS ABS: 0.5 10*3/uL (ref 0.0–0.7)
EOS PCT: 5 % (ref 0–5)
HEMATOCRIT: 39 % (ref 39.0–52.0)
HEMOGLOBIN: 13.2 g/dL (ref 13.0–17.0)
Lymphocytes Relative: 11 % — ABNORMAL LOW (ref 12–46)
Lymphs Abs: 1.1 10*3/uL (ref 0.7–4.0)
MCH: 28.6 pg (ref 26.0–34.0)
MCHC: 33.8 g/dL (ref 30.0–36.0)
MCV: 84.6 fL (ref 78.0–100.0)
MONO ABS: 0.9 10*3/uL (ref 0.1–1.0)
MONOS PCT: 10 % (ref 3–12)
Neutro Abs: 6.9 10*3/uL (ref 1.7–7.7)
Neutrophils Relative %: 74 % (ref 43–77)
Platelets: 177 10*3/uL (ref 150–400)
RBC: 4.61 MIL/uL (ref 4.22–5.81)
RDW: 12.8 % (ref 11.5–15.5)
WBC: 9.4 10*3/uL (ref 4.0–10.5)

## 2014-06-04 LAB — GLUCOSE, CAPILLARY
GLUCOSE-CAPILLARY: 139 mg/dL — AB (ref 70–99)
GLUCOSE-CAPILLARY: 154 mg/dL — AB (ref 70–99)
GLUCOSE-CAPILLARY: 202 mg/dL — AB (ref 70–99)
Glucose-Capillary: 90 mg/dL (ref 70–99)

## 2014-06-04 MED ORDER — SULFAMETHOXAZOLE-TRIMETHOPRIM 800-160 MG PO TABS
1.0000 | ORAL_TABLET | Freq: Two times a day (BID) | ORAL | Status: DC
  Administered 2014-06-04 – 2014-06-05 (×3): 1 via ORAL
  Filled 2014-06-04 (×4): qty 1

## 2014-06-04 MED ORDER — POTASSIUM CHLORIDE CRYS ER 20 MEQ PO TBCR
40.0000 meq | EXTENDED_RELEASE_TABLET | Freq: Two times a day (BID) | ORAL | Status: AC
  Administered 2014-06-04 (×2): 40 meq via ORAL
  Filled 2014-06-04 (×2): qty 2

## 2014-06-04 NOTE — Progress Notes (Signed)
Inpatient Diabetes Program Recommendations  AACE/ADA: New Consensus Statement on Inpatient Glycemic Control (2013)  Target Ranges:  Prepandial:   less than 140 mg/dL      Peak postprandial:   less than 180 mg/dL (1-2 hours)      Critically ill patients:  140 - 180 mg/dL     Results for [name], [name] (MRN [id number]) as of 06/04/2014 10:02  Ref. Range 06/03/2014 07:38 06/03/2014 11:21 06/03/2014 16:15 06/03/2014 20:56  Glucose-Capillary Latest Range: 70-99 mg/dL 67 (L) 107 (H) 205 (H) 167 (H)    Results for [name], [name] (MRN [id number]) as of 06/04/2014 10:02  Ref. Range 06/04/2014 07:46  Glucose-Capillary Latest Range: 70-99 mg/dL 90     Current Insulin Orders: Lantus 40 units daily      Metformin 500 mg daily    MD- Please consider the following in-hospital insulin adjustments:  1. Decrease Lantus by 10% to Lantus 35 units daily 2. Add Novolog Sensitive SSI tid ac + HS    Will follow Wyn Quaker RN, MSN, CDE Diabetes Coordinator Inpatient Diabetes Program Team Pager: [phone] (8a-10p)

## 2014-06-04 NOTE — Progress Notes (Signed)
[facility] PROGRESS NOTE   Assessment/Plan: Acute Encephalopathy/Sepsis/right arm cellulitis - likely due to infectious process, toxic-metabolic etiologies - MRI no acute CVA. - right arm cellulitis appears improved. - De-escalate vanc and rocephin to bactrim.  IDDM - cont home lantus + SSI - A1C 7.7  HTN -BPs now High. - resume home meds.  hx/o Afib  - EKG NSR on presentation  - rate controlled - likely not anticoagulation candidate given hx/o falls     Code Status: full Family Communication: wife and daughter  Disposition Plan: inpatient   Consultants:  none  Procedures:  CT head  MRI brain  Antibiotics:  vanc and cefepime  HPI/Subjective: No complains  Objective: Filed Vitals:   06/03/14 1321 06/03/14 2100 06/04/14 0500 06/04/14 0945  BP: 117/53 144/44 147/61 138/62  Pulse: 71 84 76   Temp: 97.9 F (36.6 C) 97.8 F (36.6 C) 97.9 F (36.6 C)   TempSrc: Oral     Resp: 18 20 21    Height:      Weight:   92.08 kg (203 lb)   SpO2: 96% 97% 96%     Intake/Output Summary (Last 24 hours) at 06/04/14 1008 Last data filed at 06/04/14 0826  Gross per 24 hour  Intake    960 ml  Output   1551 ml  Net   -591 ml   Filed Weights   06/02/14 0424 06/03/14 0522 06/04/14 0500  Weight: 92.579 kg (204 lb 1.6 oz) 93.441 kg (206 lb) 92.08 kg (203 lb)    Exam:  General: Alert, awake, oriented x3, in no acute distress.  HEENT: No bruits, no goiter.  Heart: Regular rate and rhythm. Lungs: Good air movement, clear Abdomen: Soft, nontender, nondistended, positive bowel sounds.  Skin: erythema improved.   Data Reviewed: Basic Metabolic Panel:  Recent Labs Lab 05/30/14 0136 05/31/14 0628 06/01/14 0344 06/02/14 0337 06/03/14 0300  NA 136* 137 138 138 139  K 4.3 4.0 3.6* 3.9 3.3*  CL 104 102 100 102 100  CO2 19 25 25 22 27   GLUCOSE 170* 149* 127* 139* 83  BUN 18 18 21 20 19   CREATININE 0.71 0.66 0.54 0.53 0.52  CALCIUM 7.6* 8.5  8.3* 8.5 8.7  MG  --   --   --   --  1.9   Liver Function Tests:  Recent Labs Lab 05/30/14 0136 05/31/14 0628 06/01/14 0344 06/02/14 0337 06/03/14 0300  AST 120* 129* 102* 88* 65*  ALT 33 63* 69* 76* 77*  ALKPHOS 62 70 65 61 72  BILITOT 0.4 0.3 0.3 0.2* 0.3  PROT 5.5* 5.6* 5.5* 5.4* 5.6*  ALBUMIN 2.5* 2.4* 2.3* 2.1* 2.4*   No results for input(s): LIPASE, AMYLASE in the last 168 hours.  Recent Labs Lab 05/29/14 2004  AMMONIA 36   CBC:  Recent Labs Lab 05/31/14 0628 06/01/14 0344 06/02/14 0337 06/03/14 0300 06/04/14 0327  WBC 16.2* 12.9* 10.6* 10.2 9.4  NEUTROABS 13.6* 10.2* 7.8* 7.2 6.9  HGB 13.8 12.9* 13.2 13.0 13.2  HCT 40.4 39.0 39.3 38.6* 39.0  MCV 88.6 86.5 85.6 85.0 84.6  PLT 107* 122* 135* 158 177   Cardiac Enzymes: No results for input(s): CKTOTAL, CKMB, CKMBINDEX, TROPONINI in the last 168 hours. BNP (last 3 results) No results for input(s): PROBNP in the last 8760 hours. CBG:  Recent Labs Lab 06/03/14 0738 06/03/14 1121 06/03/14 1615 06/03/14 2056 06/04/14 0746  GLUCAP 67* 107* 205* 167* 90    Recent Results (from the past 240  hour(s))  Blood Culture (routine x 2)     Status: None (Preliminary result)   Collection Time: 05/29/14  8:40 PM  Result Value Ref Range Status   Specimen Description BLOOD ARM LEFT  Final   Special Requests BOTTLES DRAWN AEROBIC AND ANAEROBIC 10CC  Final   Culture  Setup Time   Final    05/30/2014 00:57 Performed at [facility]    Culture   Final           BLOOD CULTURE RECEIVED NO GROWTH TO DATE CULTURE WILL BE HELD FOR 5 DAYS BEFORE ISSUING A FINAL NEGATIVE REPORT Performed at [facility]    Report Status PENDING  Incomplete  Blood Culture (routine x 2)     Status: None (Preliminary result)   Collection Time: 05/29/14  8:50 PM  Result Value Ref Range Status   Specimen Description BLOOD ARM RIGHT  Final   Special Requests BOTTLES DRAWN AEROBIC AND ANAEROBIC 10CC  Final   Culture  Setup  Time   Final    05/30/2014 00:58 Performed at [facility]    Culture   Final           BLOOD CULTURE RECEIVED NO GROWTH TO DATE CULTURE WILL BE HELD FOR 5 DAYS BEFORE ISSUING A FINAL NEGATIVE REPORT Performed at [facility]    Report Status PENDING  Incomplete  Urine culture     Status: None   Collection Time: 05/29/14  9:25 PM  Result Value Ref Range Status   Specimen Description URINE, CATHETERIZED  Final   Special Requests NONE  Final   Culture  Setup Time   Final    05/30/2014 04:14 Performed at [place] Performed at [facility]   Final   Culture NO GROWTH Performed at [facility]   Final   Report Status 05/31/2014 FINAL  Final     Studies: No results found.  Scheduled Meds: . acidophilus  1 capsule Oral Daily  . amLODipine  5 mg Oral Daily  . aspirin  325 mg Oral Q M,W,F  . cefTRIAXone (ROCEPHIN)  IV  1 g Intravenous Q24H  . clopidogrel  75 mg Oral QPC breakfast  . heparin  5,000 Units Subcutaneous 3 times per day  . insulin glargine  40 Units Subcutaneous Daily  . lisinopril  20 mg Oral Daily  . metFORMIN  500 mg Oral Q breakfast  . metoprolol tartrate  25 mg Oral BID  . potassium chloride  40 mEq Oral BID  . saccharomyces boulardii  250 mg Oral BID  . simvastatin  20 mg Oral QHS  . sodium chloride  3 mL Intravenous Q12H  . tamsulosin  0.4 mg Oral Daily  . vancomycin  1,000 mg Intravenous Q12H   Continuous Infusions: . sodium chloride 10 mL/hr at 05/30/14 [place], ABRAHAM  [facility] Pager [phone]. If 8PM-8AM, please contact night-coverage at [email or web address], password [facility] 06/04/2014, 10:08 AM  LOS: 6 days

## 2014-06-05 LAB — CULTURE, BLOOD (ROUTINE X 2)
Culture: NO GROWTH
Culture: NO GROWTH

## 2014-06-05 LAB — BASIC METABOLIC PANEL
Anion gap: 10 (ref 5–15)
BUN: 13 mg/dL (ref 6–23)
CO2: 29 meq/L (ref 19–32)
Calcium: 9.4 mg/dL (ref 8.4–10.5)
Chloride: 103 mEq/L (ref 96–112)
Creatinine, Ser: 0.69 mg/dL (ref 0.50–1.35)
GFR calc Af Amer: >90 mL/min (ref >90)
GFR calc non Af Amer: >90 mL/min (ref >90)
GLUCOSE: 69 mg/dL — AB (ref 70–99)
POTASSIUM: 4.4 meq/L (ref 3.7–5.3)
Sodium: 142 mEq/L (ref 137–147)

## 2014-06-05 LAB — CBC WITH DIFFERENTIAL/PLATELET
Basophils Absolute: 0 10*3/uL (ref 0.0–0.1)
Basophils Relative: 0 % (ref 0–1)
EOS PCT: 5 % (ref 0–5)
Eosinophils Absolute: 0.6 10*3/uL (ref 0.0–0.7)
HCT: 37.3 % — ABNORMAL LOW (ref 39.0–52.0)
Hemoglobin: 12.5 g/dL — ABNORMAL LOW (ref 13.0–17.0)
LYMPHS ABS: 1.2 10*3/uL (ref 0.7–4.0)
Lymphocytes Relative: 11 % — ABNORMAL LOW (ref 12–46)
MCH: 28.8 pg (ref 26.0–34.0)
MCHC: 33.5 g/dL (ref 30.0–36.0)
MCV: 85.9 fL (ref 78.0–100.0)
Monocytes Absolute: 1.4 10*3/uL — ABNORMAL HIGH (ref 0.1–1.0)
Monocytes Relative: 12 % (ref 3–12)
NEUTROS PCT: 72 % (ref 43–77)
Neutro Abs: 8.2 10*3/uL — ABNORMAL HIGH (ref 1.7–7.7)
PLATELETS: 194 10*3/uL (ref 150–400)
RBC: 4.34 MIL/uL (ref 4.22–5.81)
RDW: 13 % (ref 11.5–15.5)
WBC: 11.5 10*3/uL — AB (ref 4.0–10.5)

## 2014-06-05 LAB — GLUCOSE, CAPILLARY
Glucose-Capillary: 60 mg/dL — ABNORMAL LOW (ref 70–99)
Glucose-Capillary: 98 mg/dL (ref 70–99)
Glucose-Capillary: 142 mg/dL — ABNORMAL HIGH (ref 70–99)

## 2014-06-05 MED ORDER — SULFAMETHOXAZOLE-TRIMETHOPRIM 800-160 MG PO TABS: 1.0000 | ORAL_TABLET | Freq: Two times a day (BID) | ORAL | Status: DC

## 2014-06-05 MED ORDER — TAMSULOSIN HCL 0.4 MG PO CAPS: 0.4000 mg | ORAL_CAPSULE | Freq: Every day | ORAL | Status: DC

## 2014-06-05 NOTE — Discharge Summary (Signed)
Discharge instructions reviewed with patient and family at bedside. Patient denies questions. AVS and prescriptions given to patient. IV discontinued. Patient to get dressed and wheelchair transport to be set up. Daughter waiting at emergency department to drive patient home.

## 2014-06-05 NOTE — Progress Notes (Signed)
Inpatient Diabetes Program Recommendations  AACE/ADA: New Consensus Statement on Inpatient Glycemic Control (2013)  Target Ranges:  Prepandial:   less than 140 mg/dL      Peak postprandial:   less than 180 mg/dL (1-2 hours)      Critically ill patients:  140 - 180 mg/dL    Results for [name], [name] (MRN [id number]) as of 06/05/2014 10:04  Ref. Range 06/03/2014 07:38 06/03/2014 11:21 06/03/2014 16:15 06/03/2014 20:56  Glucose-Capillary Latest Range: 70-99 mg/dL 67 (L) 107 (H) 205 (H) 167 (H)    Results for [name], [name] (MRN [id number]) as of 06/05/2014 10:04  Ref. Range 06/04/2014 07:46 06/04/2014 11:36 06/04/2014 16:54 06/04/2014 21:15  Glucose-Capillary Latest Range: 70-99 mg/dL 90 139 (H) 154 (H) 202 (H)    Results for [name], [name] (MRN [id number]) as of 06/05/2014 10:04  Ref. Range 06/05/2014 07:52 06/05/2014 08:30  Glucose-Capillary Latest Range: 70-99 mg/dL 60 (L) 98    Current Insulin Orders: Lantus 40 units daily  Metformin 500 mg daily    **Hypoglycemic on AM of 12/13 and AM of 12/15 after receiving 40 units Lantus.     MD- Please consider the following in-hospital insulin adjustments:  1. Decrease Lantus by 10% to Lantus 35 units daily 2. Add Novolog Sensitive SSI tid ac + HS     Will follow Wyn Quaker RN, MSN, CDE Diabetes Coordinator Inpatient Diabetes Program Team Pager: [phone] (8a-10p)

## 2014-06-05 NOTE — Plan of Care (Signed)
Problem: Phase III Progression Outcomes Goal: Activity at appropriate level-compared to baseline (UP IN CHAIR FOR HEMODIALYSIS)  Outcome: Completed/Met Date Met:  06/05/14 Activity appropriate level compared to baseline. PT recommended home health PT, but patient refused.

## 2014-06-05 NOTE — Discharge Summary (Signed)
Physician Discharge Summary  [name]:[id number] DOB: 29-Jun-1940 DOA: 05/29/2014  PCP: Donnie Coffin, MD  Admit date: 05/29/2014 Discharge date: 06/05/2014  Time spent: 35 minutes  Recommendations for Outpatient Follow-up:  1. Follow up with PCP. 2. Home health  Discharge Diagnoses:  Active Problems:   Encephalopathy   Sepsis   Cellulitis of right upper arm   Blood poisoning   Discharge Condition: stable  Diet recommendation: heart healthy  Filed Weights   06/03/14 0522 06/04/14 0500 06/05/14 0500  Weight: 93.441 kg (206 lb) 92.08 kg (203 lb) 91.672 kg (202 lb 1.6 oz)    History of present illness:  73 y.o. year old male with significant past medical history of CVA, IDDM, recurrent falls, hx/o afib, hx/o lymphoma s/p resection in remission presenting with encephalopathy, UTI, sepsis, fall. Family states the patient was diagnosed by his PCP with UTI as today's. Was prescribed Cipro. Per family patient has been noncompliant with medicine. Patient was found today to be confused as well as have a subjective fevers at home. Per report, patient was found by family between the toilet and the sink on the floor. Also confused. Initially had to be resuscitated by bag valve mask on EMS arrival that did improve after short course. Patient does not recollect incident. Is unaware of any direct head trauma.  Hospital Course:  Acute Encephalopathy/Sepsis/right arm cellulitis - likely due to infectious process, toxic-metabolic etiologies - MRI no acute CVA. - started on IV  - right arm cellulitis appears improved. - De-escalate vanc and rocephin to bactrim.  IDDM - Resume home lantus and metformin. - A1C 7.7  HTN -BPs now High. - resume home meds.  hx/o Afib  - EKG NSR on presentation  - rate controlled - likely not anticoagulation candidate given hx/o falls    Procedures:  CT head and neck  MRI brain  EKG  Consultations:  none  Discharge Exam: Filed  Vitals:   06/05/14 0500  BP: 130/38  Pulse: 68  Temp: 97.6 F (36.4 C)  Resp: 15    General: A&O x3 Cardiovascular: RRR Respiratory: good air movement CTA B/L  Discharge Instructions You were cared for by a hospitalist during your hospital stay. If you have any questions about your discharge medications or the care you received while you were in the hospital after you are discharged, you can call the unit and asked to speak with the hospitalist on call if the hospitalist that took care of you is not available. Once you are discharged, your primary care physician will handle any further medical issues. Please note that NO REFILLS for any discharge medications will be authorized once you are discharged, as it is imperative that you return to your primary care physician (or establish a relationship with a primary care physician if you do not have one) for your aftercare needs so that they can reassess your need for medications and monitor your lab values.  Discharge Instructions    Diet - low sodium heart healthy    Complete by:  As directed      Increase activity slowly    Complete by:  As directed           Current Discharge Medication List    START taking these medications   Details  sulfamethoxazole-trimethoprim (BACTRIM DS,SEPTRA DS) 800-160 MG per tablet Take 1 tablet by mouth every 12 (twelve) hours. Qty: 14 tablet, Refills: 0    tamsulosin (FLOMAX) 0.4 MG CAPS capsule Take 1 capsule (0.4 mg total)  by mouth daily. Qty: 30 capsule, Refills: 0      CONTINUE these medications which have NOT CHANGED   Details  amLODipine (NORVASC) 5 MG tablet Take 5 mg by mouth daily.     aspirin 325 MG tablet Take 325 mg by mouth every Monday, Wednesday, and Friday.     clopidogrel (PLAVIX) 75 MG tablet Take 75 mg by mouth daily after breakfast.     insulin glargine (LANTUS) 100 UNIT/ML injection Inject 40 Units into the skin daily.     lisinopril (PRINIVIL,ZESTRIL) 20 MG tablet Take 20  mg by mouth every morning.     metFORMIN (GLUCOPHAGE) 500 MG tablet Take 500 mg by mouth daily with breakfast.     metoprolol tartrate (LOPRESSOR) 25 MG tablet Take 25 mg by mouth 2 (two) times daily.     simvastatin (ZOCOR) 20 MG tablet Take 20 mg by mouth at bedtime.      ACCU-CHEK AVIVA PLUS test strip 1 each by Other route as needed (blod sugar).     Probiotic Product (ALIGN PO) Take 1 capsule by mouth daily.     saccharomyces boulardii (FLORASTOR) 250 MG capsule Take 1 capsule (250 mg total) by mouth 2 (two) times daily. Qty: 30 capsule, Refills: 0    silver sulfADIAZINE (SILVADENE) 1 % cream Apply 1 application topically daily. Qty: 50 g, Refills: 1      STOP taking these medications     ciprofloxacin (CIPRO) 250 MG tablet      clindamycin (CLEOCIN) 150 MG capsule        Allergies  Allergen Reactions  . Actos [Pioglitazone Hydrochloride] Other (See Comments)    Afib  . Hydrochlorothiazide Other (See Comments)    Lower potassium to low  . Penicillins Rash    Tolerated cefepime, ceftriaxone      The results of significant diagnostics from this hospitalization (including imaging, microbiology, ancillary and laboratory) are listed below for reference.    Significant Diagnostic Studies: Ct Head Wo Contrast  05/29/2014   CLINICAL DATA:  Found unresponsive but white, apneic at EMS arrival, response to pain, difficulty controlling breathing, can osseous during imaging, stroke, partial history hypertension, diabetes, B-cell lymphoma, Charcot-Marie-Tooth syndrome  EXAM: CT HEAD WITHOUT CONTRAST  CT CERVICAL SPINE WITHOUT CONTRAST  TECHNIQUE: Multidetector CT imaging of the head and cervical spine was performed following the standard protocol without intravenous contrast. Multiplanar CT image reconstructions of the cervical spine were also generated.  COMPARISON:  CT head 09/16/2013, CT neck 10/07/2012  FINDINGS: CT HEAD FINDINGS  Generalized atrophy.  Normal ventricular  morphology.  No midline shift or mass effect.  Small vessel chronic ischemic changes of deep cerebral white matter.  No intracranial hemorrhage, mass lesion, or acute infarction.  Visualized paranasal sinuses and mastoid air cells clear.  Bones demineralized.  Atherosclerotic calcifications of internal carotid and vertebral arteries at skullbase.  CT CERVICAL SPINE FINDINGS  Scattered atherosclerotic calcifications greatest at the carotid bifurcations.  Visualized skullbase intact.  Diffuse motion artifact severely limits exam.  Disc space narrowing at C6-C7.  Prevertebral soft tissues normal thickness.  No gross evidence of fracture or subluxation identified within significant limitations of motion.  Lung apices clear.  IMPRESSION: Atrophy with small vessel chronic ischemic changes of deep cerebral white matter.  No acute intracranial abnormalities.  Severely limited assessment of the cervical spine secondary to motion.  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  No gross acute cervical spine abnormality identified on limited exam.   Electronically Signed  By: Lavonia Dana M.D.   On: 05/29/2014 20:36   Ct Cervical Spine Wo Contrast  05/29/2014   CLINICAL DATA:  Found unresponsive but white, apneic at EMS arrival, response to pain, difficulty controlling breathing, can osseous during imaging, stroke, partial history hypertension, diabetes, B-cell lymphoma, Charcot-Marie-Tooth syndrome  EXAM: CT HEAD WITHOUT CONTRAST  CT CERVICAL SPINE WITHOUT CONTRAST  TECHNIQUE: Multidetector CT imaging of the head and cervical spine was performed following the standard protocol without intravenous contrast. Multiplanar CT image reconstructions of the cervical spine were also generated.  COMPARISON:  CT head 09/16/2013, CT neck 10/07/2012  FINDINGS: CT HEAD FINDINGS  Generalized atrophy.  Normal ventricular morphology.  No midline shift or mass effect.  Small vessel chronic ischemic changes of deep cerebral white matter.  No  intracranial hemorrhage, mass lesion, or acute infarction.  Visualized paranasal sinuses and mastoid air cells clear.  Bones demineralized.  Atherosclerotic calcifications of internal carotid and vertebral arteries at skullbase.  CT CERVICAL SPINE FINDINGS  Scattered atherosclerotic calcifications greatest at the carotid bifurcations.  Visualized skullbase intact.  Diffuse motion artifact severely limits exam.  Disc space narrowing at C6-C7.  Prevertebral soft tissues normal thickness.  No gross evidence of fracture or subluxation identified within significant limitations of motion.  Lung apices clear.  IMPRESSION: Atrophy with small vessel chronic ischemic changes of deep cerebral white matter.  No acute intracranial abnormalities.  Severely limited assessment of the cervical spine secondary to motion.  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  No gross acute cervical spine abnormality identified on limited exam.   Electronically Signed   By: Lavonia Dana M.D.   On: 05/29/2014 20:36   Mr Brain Wo Contrast  05/30/2014   CLINICAL DATA:  Encephalopathy, sepsis, hypertension and diabetes, altered mental status.  EXAM: MRI HEAD WITHOUT CONTRAST  TECHNIQUE: Multiplanar, multiecho pulse sequences of the brain and surrounding structures were obtained without intravenous contrast.  COMPARISON:  CT head 05/29/2014.  FINDINGS: No areas of acute stroke or hemorrhage. Extensive cerebral and cerebellar atrophy. Scattered areas of remote brainstem and cerebellar infarction. Extensive chronic microvascular ischemic change throughout the periventricular and subcortical white matter. Chronic LEFT occipital microbleed, likely sequelae of hypertensive cerebrovascular disease. Flow voids are maintained. No acute sinus or mastoid disease. Mild pannus. No osseous lesion.  IMPRESSION: No acute abnormality.  Chronic changes as described.   Electronically Signed   By: Rolla Flatten M.D.   On: 05/30/2014 08:48   Dg Chest Port 1  View  05/29/2014   CLINICAL DATA:  Shortness of breath and altered mental status. History of non-Hodgkin's lymphoma, diabetes, atrial fibrillation, B-cell lymphoma, CVA.  EXAM: PORTABLE CHEST - 1 VIEW  COMPARISON:  09/16/2013  FINDINGS: Shallow inspiration. The heart size and mediastinal contours are within normal limits. Both lungs are clear. The visualized skeletal structures are unremarkable.  IMPRESSION: No active disease.   Electronically Signed   By: Lucienne Capers M.D.   On: 05/29/2014 21:15    Microbiology: Recent Results (from the past 240 hour(s))  Blood Culture (routine x 2)     Status: None   Collection Time: 05/29/14  8:40 PM  Result Value Ref Range Status   Specimen Description BLOOD ARM LEFT  Final   Special Requests BOTTLES DRAWN AEROBIC AND ANAEROBIC 10CC  Final   Culture  Setup Time   Final    05/30/2014 00:57 Performed at [place]   Final    NO GROWTH 5 DAYS Performed  at [facility]    Report Status 06/05/2014 FINAL  Final  Blood Culture (routine x 2)     Status: None   Collection Time: 05/29/14  8:50 PM  Result Value Ref Range Status   Specimen Description BLOOD ARM RIGHT  Final   Special Requests BOTTLES DRAWN AEROBIC AND ANAEROBIC 10CC  Final   Culture  Setup Time   Final    05/30/2014 00:58 Performed at [facility]    Culture   Final    NO GROWTH 5 DAYS Performed at [facility]    Report Status 06/05/2014 FINAL  Final  Urine culture     Status: None   Collection Time: 05/29/14  9:25 PM  Result Value Ref Range Status   Specimen Description URINE, CATHETERIZED  Final   Special Requests NONE  Final   Culture  Setup Time   Final    05/30/2014 04:14 Performed at [place] Performed at [facility]   Final   Culture NO GROWTH Performed at [facility]   Final   Report Status 05/31/2014 FINAL  Final     Labs: Basic Metabolic Panel:  Recent  Labs Lab 05/31/14 0628 06/01/14 0344 06/02/14 0337 06/03/14 0300 06/05/14 0513  NA 137 138 138 139 142  K 4.0 3.6* 3.9 3.3* 4.4  CL 102 100 102 100 103  CO2 25 25 22 27 29   GLUCOSE 149* 127* 139* 83 69*  BUN 18 21 20 19 13   CREATININE 0.66 0.54 0.53 0.52 0.69  CALCIUM 8.5 8.3* 8.5 8.7 9.4  MG  --   --   --  1.9  --    Liver Function Tests:  Recent Labs Lab 05/30/14 0136 05/31/14 0628 06/01/14 0344 06/02/14 0337 06/03/14 0300  AST 120* 129* 102* 88* 65*  ALT 33 63* 69* 76* 77*  ALKPHOS 62 70 65 61 72  BILITOT 0.4 0.3 0.3 0.2* 0.3  PROT 5.5* 5.6* 5.5* 5.4* 5.6*  ALBUMIN 2.5* 2.4* 2.3* 2.1* 2.4*   No results for input(s): LIPASE, AMYLASE in the last 168 hours.  Recent Labs Lab 05/29/14 2004  AMMONIA 36   CBC:  Recent Labs Lab 06/01/14 0344 06/02/14 0337 06/03/14 0300 06/04/14 0327 06/05/14 0513  WBC 12.9* 10.6* 10.2 9.4 11.5*  NEUTROABS 10.2* 7.8* 7.2 6.9 8.2*  HGB 12.9* 13.2 13.0 13.2 12.5*  HCT 39.0 39.3 38.6* 39.0 37.3*  MCV 86.5 85.6 85.0 84.6 85.9  PLT 122* 135* 158 177 194   Cardiac Enzymes: No results for input(s): CKTOTAL, CKMB, CKMBINDEX, TROPONINI in the last 168 hours. BNP: BNP (last 3 results) No results for input(s): PROBNP in the last 8760 hours. CBG:  Recent Labs Lab 06/04/14 1136 06/04/14 1654 06/04/14 2115 06/05/14 0752 06/05/14 0830  GLUCAP 139* 154* 202* 60* 98     Signed:  FELIZ ORTIZ, ABRAHAM  [facility] 06/05/2014, 10:17 AM

## 2014-07-17 ENCOUNTER — Other Ambulatory Visit: Payer: Medicare Other

## 2014-07-31 ENCOUNTER — Ambulatory Visit (INDEPENDENT_AMBULATORY_CARE_PROVIDER_SITE_OTHER): Payer: Medicare Other

## 2014-07-31 DIAGNOSIS — E1140 Type 2 diabetes mellitus with diabetic neuropathy, unspecified: Secondary | ICD-10-CM

## 2014-07-31 DIAGNOSIS — Q828 Other specified congenital malformations of skin: Secondary | ICD-10-CM

## 2014-07-31 DIAGNOSIS — M79676 Pain in unspecified toe(s): Secondary | ICD-10-CM

## 2014-07-31 DIAGNOSIS — M2040 Other hammer toe(s) (acquired), unspecified foot: Secondary | ICD-10-CM

## 2014-07-31 DIAGNOSIS — B351 Tinea unguium: Secondary | ICD-10-CM

## 2014-07-31 NOTE — Progress Notes (Signed)
   Subjective:    Patient ID: [name], male    DOB: 07/20/1940, 74 y.o.   MRN: [id number]  HPI Pt presents for nail debridement   Review of Systems no new findings or systemic changes noted since last here patient did have some infection in his shoulder after a flu shot or the pneumonia vaccine. Also had a urinary tract infection.     Objective:   Physical Exam  Patient also indicates recently had a fall and had difficulty getting up when he did he lacerated his toes second toes on both feet show some dried blood discoloration laceration distal and dorsal surfaces of the toes. Active keratoses and distal ulceration of the second toe right foot. No purulence no signs of infection. Orthopedic exam rectus foot type mild digital contractures history of peas previous partial amputation. No open wounds or ulcers are noted except for distal tuft second toe right foot with hemorrhage a keratoses due to laceration on the toe. There is keratoses subsecond MTP area left and distal clavus second right. Nails brittle [place] friable dystrophic and discolored 9 .  No secondary infections no ascending psoas lymphangitis patient does have bleeds very easily is on Coumadin therapy however is been off his therapy for couple days to prevent bleeding during debridement      Assessment & Plan:  Assessment this time history of diabetes with history peripheral neuropathy and angiopathy. DP and PT 1 over 4 thready at best distal clavus second right and subsecond MTP area left keratosis are debrided also multiple dystrophic frontal mycotic nails debrided 9.2-3 months for follow up in the future palliative care is needed next  Harriet Masson DPM

## 2014-10-22 ENCOUNTER — Other Ambulatory Visit: Payer: Self-pay | Admitting: Hematology and Oncology

## 2014-10-22 DIAGNOSIS — C8510 Unspecified B-cell lymphoma, unspecified site: Secondary | ICD-10-CM

## 2014-10-23 ENCOUNTER — Encounter: Payer: Self-pay | Admitting: Hematology and Oncology

## 2014-10-23 ENCOUNTER — Ambulatory Visit (HOSPITAL_BASED_OUTPATIENT_CLINIC_OR_DEPARTMENT_OTHER): Payer: Medicare Other | Admitting: Hematology and Oncology

## 2014-10-23 ENCOUNTER — Other Ambulatory Visit (HOSPITAL_BASED_OUTPATIENT_CLINIC_OR_DEPARTMENT_OTHER): Payer: Medicare Other

## 2014-10-23 VITALS — BP 153/57 | HR 62 | Temp 98.0°F | Resp 18 | Ht 71.0 in | Wt 205.9 lb

## 2014-10-23 DIAGNOSIS — C8510 Unspecified B-cell lymphoma, unspecified site: Secondary | ICD-10-CM

## 2014-10-23 DIAGNOSIS — Z8572 Personal history of non-Hodgkin lymphomas: Secondary | ICD-10-CM | POA: Diagnosis not present

## 2014-10-23 LAB — COMPREHENSIVE METABOLIC PANEL (CC13)
ALBUMIN: 3.3 g/dL — AB (ref 3.5–5.0)
ALT: 21 U/L (ref 0–55)
AST: 18 U/L (ref 5–34)
Alkaline Phosphatase: 74 U/L (ref 40–150)
Anion Gap: 9 mEq/L (ref 3–11)
BUN: 14.5 mg/dL (ref 7.0–26.0)
CALCIUM: 9.1 mg/dL (ref 8.4–10.4)
CO2: 28 mEq/L (ref 22–29)
Chloride: 103 mEq/L (ref 98–109)
Creatinine: 0.7 mg/dL (ref 0.7–1.3)
GLUCOSE: 90 mg/dL (ref 70–140)
POTASSIUM: 4.7 meq/L (ref 3.5–5.1)
SODIUM: 140 meq/L (ref 136–145)
TOTAL PROTEIN: 6.3 g/dL — AB (ref 6.4–8.3)
Total Bilirubin: 0.51 mg/dL (ref 0.20–1.20)

## 2014-10-23 LAB — CBC WITH DIFFERENTIAL/PLATELET
BASO%: 0.2 % (ref 0.0–2.0)
Basophils Absolute: 0 10*3/uL (ref 0.0–0.1)
EOS%: 9.8 % — ABNORMAL HIGH (ref 0.0–7.0)
Eosinophils Absolute: 1.3 10*3/uL — ABNORMAL HIGH (ref 0.0–0.5)
HCT: 42 % (ref 38.4–49.9)
HGB: 14.3 g/dL (ref 13.0–17.1)
LYMPH%: 12.2 % — ABNORMAL LOW (ref 14.0–49.0)
MCH: 29.9 pg (ref 27.2–33.4)
MCHC: 34 g/dL (ref 32.0–36.0)
MCV: 87.9 fL (ref 79.3–98.0)
MONO#: 1.4 10*3/uL — AB (ref 0.1–0.9)
MONO%: 10.6 % (ref 0.0–14.0)
NEUT#: 8.6 10*3/uL — ABNORMAL HIGH (ref 1.5–6.5)
NEUT%: 67.2 % (ref 39.0–75.0)
Platelets: 156 10*3/uL (ref 140–400)
RBC: 4.78 10*6/uL (ref 4.20–5.82)
RDW: 13.2 % (ref 11.0–14.6)
WBC: 12.7 10*3/uL — AB (ref 4.0–10.3)
lymph#: 1.6 10*3/uL (ref 0.9–3.3)

## 2014-10-23 LAB — LACTATE DEHYDROGENASE (CC13): LDH: 156 U/L (ref 125–245)

## 2014-10-24 NOTE — Progress Notes (Signed)
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 According to the patient, he was diagnosed incidentally when he was undergoing carotid ultrasound for history of stroke. Biopsy from the right neck lymph node confirm diagnosis of diffuse large B-cell lymphoma. Between July to August 2011, he received 3 cycles of R. CHOP chemotherapy followed by consolidation radiation therapy.   INTERVAL HISTORY: Please see below for problem oriented charting. He feels well up off from mild left knee pain. Denies new lymphadenopathy.  REVIEW OF SYSTEMS:   Constitutional: Denies fevers, chills or abnormal weight loss Eyes: Denies blurriness of vision Ears, nose, mouth, throat, and face: Denies mucositis or sore throat Respiratory: Denies cough, dyspnea or wheezes Cardiovascular: Denies palpitation, chest discomfort or lower extremity swelling Gastrointestinal:  Denies nausea, heartburn or change in bowel habits Skin: Denies abnormal skin rashes Lymphatics: Denies new lymphadenopathy or easy bruising Neurological:Denies numbness, tingling or new weaknesses Behavioral/Psych: Mood is stable, no new changes  All other systems were reviewed with the patient and are negative.  I have reviewed the past medical history, past surgical history, social history and family history with the patient and they are unchanged from previous note.  ALLERGIES:  is allergic to actos; hydrochlorothiazide; and penicillins.  MEDICATIONS:  Current Outpatient Prescriptions  Medication Sig Dispense Refill  . ACCU-CHEK AVIVA PLUS test  strip 1 each by Other route as needed (blod sugar).     [name] amLODipine (NORVASC) 5 MG tablet Take 5 mg by mouth daily.     [name] aspirin 325 MG tablet Take 325 mg by mouth every Monday, Wednesday, and Friday.     . clopidogrel (PLAVIX) 75 MG tablet Take 75 mg by mouth daily after breakfast.     . insulin glargine (LANTUS) 100 UNIT/ML injection Inject 40 Units into the skin daily.     [name] lisinopril (PRINIVIL,ZESTRIL) 20 MG tablet Take 20 mg by mouth every morning.     . metFORMIN (GLUCOPHAGE) 500 MG tablet Take 500 mg by mouth daily with breakfast.     . metoprolol tartrate (LOPRESSOR) 25 MG tablet Take 25 mg by mouth 2 (two) times daily.     . Probiotic Product (ALIGN PO) Take 1 capsule by mouth daily.     [name] saccharomyces boulardii (FLORASTOR) 250 MG capsule Take 1 capsule (250 mg total) by mouth 2 (two) times daily. 30 capsule 0  . silver sulfADIAZINE (SILVADENE) 1 % cream Apply 1 application topically daily. 50 g 1  . simvastatin (ZOCOR) 20 MG tablet Take 20 mg by mouth at bedtime.      . sulfamethoxazole-trimethoprim (BACTRIM DS,SEPTRA DS) 800-160 MG per tablet Take 1 tablet by mouth every 12 (twelve) hours. 14 tablet 0  . tamsulosin (FLOMAX) 0.4 MG CAPS capsule Take 1 capsule (0.4 mg total) by mouth daily. 30 capsule 0   No current facility-administered medications for this visit.    PHYSICAL EXAMINATION: ECOG PERFORMANCE STATUS: 0 - Asymptomatic  Filed Vitals:   10/23/14 1158  BP: 153/57  Pulse: 62  Temp: 98 F (36.7 C)  Resp: 18   Filed Weights   10/23/14 1158  Weight: 205 lb 14.4 oz (93.396  kg)    GENERAL:alert, no distress and comfortable SKIN: skin color, texture, turgor are normal, no rashes or significant lesions EYES: normal, Conjunctiva are pink and non-injected, sclera clear OROPHARYNX:no exudate, no erythema and lips, buccal mucosa, and tongue normal  NECK: supple, thyroid normal size, non-tender, without nodularity LYMPH:  no palpable lymphadenopathy in the cervical,  axillary or inguinal LUNGS: clear to auscultation and percussion with normal breathing effort HEART: regular rate & rhythm and no murmurs and no lower extremity edema ABDOMEN:abdomen soft, non-tender and normal bowel sounds Musculoskeletal:no cyanosis of digits and no clubbing  NEURO: alert & oriented x 3 with fluent speech, no focal motor/sensory deficits  LABORATORY DATA:  I have reviewed the data as listed    Component Value Date/Time   NA 140 10/23/2014 1136   NA 142 06/05/2014 0513   NA 141 10/08/2011 0918   K 4.7 10/23/2014 1136   K 4.4 06/05/2014 0513   K 4.4 10/08/2011 0918   CL 103 06/05/2014 0513   CL 101 10/07/2012 0907   CL 103 10/08/2011 0918   CO2 28 10/23/2014 1136   CO2 29 06/05/2014 0513   CO2 27 10/08/2011 0918   GLUCOSE 90 10/23/2014 1136   GLUCOSE 69* 06/05/2014 0513   GLUCOSE 92 10/07/2012 0907   GLUCOSE 96 10/08/2011 0918   BUN 14.5 10/23/2014 1136   BUN 13 06/05/2014 0513   BUN 10 10/08/2011 0918   CREATININE 0.7 10/23/2014 1136   CREATININE 0.69 06/05/2014 0513   CREATININE 0.9 10/08/2011 0918   CALCIUM 9.1 10/23/2014 1136   CALCIUM 9.4 06/05/2014 0513   CALCIUM 8.4 10/08/2011 0918   PROT 6.3* 10/23/2014 1136   PROT 5.6* 06/03/2014 0300   PROT 6.8 10/08/2011 0918   ALBUMIN 3.3* 10/23/2014 1136   ALBUMIN 2.4* 06/03/2014 0300   AST 18 10/23/2014 1136   AST 65* 06/03/2014 0300   AST 21 10/08/2011 0918   ALT 21 10/23/2014 1136   ALT 77* 06/03/2014 0300   ALT 30 10/08/2011 0918   ALKPHOS 74 10/23/2014 1136   ALKPHOS 72 06/03/2014 0300   ALKPHOS 71 10/08/2011 0918   BILITOT 0.51 10/23/2014 1136   BILITOT 0.3 06/03/2014 0300   BILITOT 0.80 10/08/2011 0918   GFRNONAA >90 06/05/2014 0513   GFRAA >90 06/05/2014 0513    No results found for: SPEP, UPEP  Lab Results  Component Value Date   WBC 12.7* 10/23/2014   NEUTROABS 8.6* 10/23/2014   HGB 14.3 10/23/2014   HCT 42.0 10/23/2014   MCV 87.9 10/23/2014   PLT 156 10/23/2014       Chemistry      Component Value Date/Time   NA 140 10/23/2014 1136   NA 142 06/05/2014 0513   NA 141 10/08/2011 0918   K 4.7 10/23/2014 1136   K 4.4 06/05/2014 0513   K 4.4 10/08/2011 0918   CL 103 06/05/2014 0513   CL 101 10/07/2012 0907   CL 103 10/08/2011 0918   CO2 28 10/23/2014 1136   CO2 29 06/05/2014 0513   CO2 27 10/08/2011 0918   BUN 14.5 10/23/2014 1136   BUN 13 06/05/2014 0513   BUN 10 10/08/2011 0918   CREATININE 0.7 10/23/2014 1136   CREATININE 0.69 06/05/2014 0513   CREATININE 0.9 10/08/2011 0918      Component Value Date/Time   CALCIUM 9.1 10/23/2014 1136   CALCIUM 9.4 06/05/2014 0513   CALCIUM 8.4 10/08/2011 0918   ALKPHOS 74 10/23/2014 1136   ALKPHOS 72 06/03/2014  0300   ALKPHOS 71 10/08/2011 0918   AST 18 10/23/2014 1136   AST 65* 06/03/2014 0300   AST 21 10/08/2011 0918   ALT 21 10/23/2014 1136   ALT 77* 06/03/2014 0300   ALT 30 10/08/2011 0918   BILITOT 0.51 10/23/2014 1136   BILITOT 0.3 06/03/2014 0300   BILITOT 0.80 10/08/2011 0918      ASSESSMENT & PLAN:  B-cell lymphoma Clinically, he had no signs of disease recurrence. He is almost a five-year cancer survivor. I recommend observation only with primary care provider. He does not need any further follow-up here and he agreed with the plan.    No orders of the defined types were placed in this encounter.   All questions were answered. The patient knows to call the clinic with any problems, questions or concerns. No barriers to learning was detected. I spent 15 minutes counseling the patient face to face. The total time spent in the appointment was 20 minutes and more than 50% was on counseling and review of test results     [facility], Sharlize Hoar, MD 10/24/2014 1:44 PM

## 2014-10-24 NOTE — Assessment & Plan Note (Signed)
Clinically, he had no signs of disease recurrence. He is almost a five-year cancer survivor. I recommend observation only with primary care provider. He does not need any further follow-up here and he agreed with the plan.

## 2014-12-04 ENCOUNTER — Encounter: Payer: Self-pay | Admitting: Podiatry

## 2014-12-04 ENCOUNTER — Ambulatory Visit (INDEPENDENT_AMBULATORY_CARE_PROVIDER_SITE_OTHER): Payer: Medicare Other | Admitting: Podiatry

## 2014-12-04 ENCOUNTER — Ambulatory Visit: Payer: Medicare Other

## 2014-12-04 DIAGNOSIS — M79676 Pain in unspecified toe(s): Secondary | ICD-10-CM

## 2014-12-04 DIAGNOSIS — E1140 Type 2 diabetes mellitus with diabetic neuropathy, unspecified: Secondary | ICD-10-CM

## 2014-12-04 DIAGNOSIS — B351 Tinea unguium: Secondary | ICD-10-CM

## 2014-12-04 NOTE — Progress Notes (Signed)
Patient ID: [name], male   DOB: September 30, 1940, 74 y.o.   MRN: [id number] HPI  Complaint:  Visit Type: Patient returns to my office for continued preventative foot care services. Complaint: Patient states" my nails have grown long and thick and become painful to walk and wear shoes" Patient has been diagnosed with DM with neuropathy and angiopathy.[name] He presents for preventative foot care services. No changes to ROS  Podiatric Exam: Vascular: dorsalis pedis and posterior tibial pulses are negative. Capillary return is immediate. Temperature gradient is negative.   Sensorium: Abnormal  Semmes Weinstein monofilament test.   Nail Exam: Pt has thick disfigured discolored nails with subungual debris noted bilateral entire nail hallux through fifth toenails.  He has mascerated necrotic tissue at nail bed second toe right foot.  No signs of redness ir infection noted.[name] Ulcer Exam: There is no evidence of ulcer or pre-ulcerative changes or infection. There is a slit-like callus under fourth metatarsal head left foot.Orthopedic Exam: Muscle tone and strength are WNL. No limitations in general ROM. No crepitus or effusions noted. Foot type and digits show no abnormalities. Bony prominences are unremarkable. Skin: No Porokeratosis. No infection or ulcers  Diagnosis:  Tinea unguium, Pain in right toe, pain in left toes  Treatment & Plan Procedures and Treatment: Consent by patient was obtained for treatment procedures. The patient understood the discussion of treatment and procedures well. All questions were answered thoroughly reviewed. Debridement of mycotic and hypertrophic toenails, 1 through 5 bilateral and clearing of subungual debris. No ulceration, no infection noted.  Return Visit-Office Procedure: Patient instructed to return to the office for a follow up visit 3 months for continued evaluation and treatment.

## 2014-12-17 ENCOUNTER — Other Ambulatory Visit: Payer: Self-pay

## 2015-03-12 ENCOUNTER — Ambulatory Visit (INDEPENDENT_AMBULATORY_CARE_PROVIDER_SITE_OTHER): Payer: Medicare Other | Admitting: Podiatry

## 2015-03-12 ENCOUNTER — Encounter: Payer: Self-pay | Admitting: Podiatry

## 2015-03-12 DIAGNOSIS — E1140 Type 2 diabetes mellitus with diabetic neuropathy, unspecified: Secondary | ICD-10-CM

## 2015-03-12 DIAGNOSIS — B351 Tinea unguium: Secondary | ICD-10-CM

## 2015-03-12 DIAGNOSIS — M79676 Pain in unspecified toe(s): Secondary | ICD-10-CM

## 2015-03-12 NOTE — Progress Notes (Signed)
Patient ID: [name], male   DOB: [birth year], 74 y.o.   MRN: [id number] HPI  Complaint:  Visit Type: Patient returns to my office for continued preventative foot care services. Complaint: Patient states" my nails have grown long and thick and become painful to walk and wear shoes" Patient has been diagnosed with DM with neuropathy and angiopathy.[name] He presents for preventative foot care services. No changes to ROS  Podiatric Exam: Vascular: dorsalis pedis and posterior tibial pulses are negative. Capillary return is immediate. Temperature gradient is negative.   Sensorium: Abnormal  Semmes Weinstein monofilament test.   Nail Exam: Pt has thick disfigured discolored nails with subungual debris noted bilateral entire nail hallux through fifth toenails except third toe right foot. ULCER  There is skin absent subungually second toe right foot.  No redness or infection or drainage.  No malodor noted.  Ulcer  2 mm. X 2 mm. ORTHOPEDIC  Muscle power and strength WNL.  Normal ROM foot joints.  Amputation third toe right foot. Diagnosis:  Tinea unguium, Pain in right toe, pain in left toes,  Ulcer second toe right foot  Treatment & Plan Procedures and Treatment: Consent by patient was obtained for treatment procedures. The patient understood the discussion of treatment and procedures well. All questions were answered thoroughly reviewed. Debridement of mycotic and hypertrophic toenails, 1 through 5 bilateral and clearing of subungual debris.  Neosporin/DSD applied second toe right foot..  Return Visit-Office Procedure: Patient instructed to return to the office for a follow up visit 3 months for continued evaluation and treatment.

## 2015-06-25 ENCOUNTER — Encounter: Payer: Self-pay | Admitting: Podiatry

## 2015-06-25 ENCOUNTER — Ambulatory Visit (INDEPENDENT_AMBULATORY_CARE_PROVIDER_SITE_OTHER): Payer: Medicare Other | Admitting: Podiatry

## 2015-06-25 DIAGNOSIS — M79676 Pain in unspecified toe(s): Secondary | ICD-10-CM

## 2015-06-25 DIAGNOSIS — E1140 Type 2 diabetes mellitus with diabetic neuropathy, unspecified: Secondary | ICD-10-CM

## 2015-06-25 DIAGNOSIS — B351 Tinea unguium: Secondary | ICD-10-CM

## 2015-06-25 NOTE — Progress Notes (Signed)
Patient ID: MATHEUS SPORN, male   DOB: Nov 19, 1940, 75 y.o.   MRN: [id number] HPI  Complaint:  Visit Type: Patient returns to my office for continued preventative foot care services. Complaint: Patient states" my nails have grown long and thick and become painful to walk and wear shoes" Patient has been diagnosed with DM with neuropathy and angiopathy.[name] He presents for preventative foot care services. No changes to ROS  Podiatric Exam: Vascular: dorsalis pedis and posterior tibial pulses are negative. Capillary return is immediate. Temperature gradient is negative.   Sensorium: Abnormal  Semmes Weinstein monofilament test.   Nail Exam: Pt has thick disfigured discolored nails with subungual debris noted bilateral entire nail hallux through fifth toenails except third toe right foot. ULCER  There is skin absent subungually second toe right foot.  No redness or infection or drainage.  No malodor noted.  Ulcer  2 mm. X 2 mm. ORTHOPEDIC  Muscle power and strength WNL.  Normal ROM foot joints.  Amputation third toe right foot. Diagnosis:  Tinea unguium, Pain in right toe, pain in left toes,  Ulcer second toe right foot  Treatment & Plan Procedures and Treatment: Consent by patient was obtained for treatment procedures. The patient understood the discussion of treatment and procedures well. All questions were answered thoroughly reviewed. Debridement of mycotic and hypertrophic toenails, 1 through 5 bilateral and clearing of subungual debris.  Neosporin/DSD applied second toe right foot..  Return Visit-Office Procedure: Patient instructed to return to the office for a follow up visit 3 months for continued evaluation and treatment.   Gardiner Barefoot DPM

## 2015-08-15 ENCOUNTER — Encounter (HOSPITAL_COMMUNITY): Payer: Self-pay | Admitting: Emergency Medicine

## 2015-08-15 ENCOUNTER — Observation Stay (HOSPITAL_COMMUNITY): Payer: Medicare Other

## 2015-08-15 ENCOUNTER — Emergency Department (HOSPITAL_COMMUNITY): Payer: Medicare Other

## 2015-08-15 ENCOUNTER — Observation Stay (HOSPITAL_COMMUNITY)
Admission: EM | Admit: 2015-08-15 | Discharge: 2015-08-19 | Disposition: A | Discharge status: Skilled Nursing Facility | Payer: Medicare Other | Attending: Internal Medicine | Admitting: Internal Medicine

## 2015-08-15 DIAGNOSIS — N390 Urinary tract infection, site not specified: Secondary | ICD-10-CM | POA: Diagnosis present

## 2015-08-15 DIAGNOSIS — R531 Weakness: Principal | ICD-10-CM

## 2015-08-15 DIAGNOSIS — R278 Other lack of coordination: Secondary | ICD-10-CM | POA: Diagnosis not present

## 2015-08-15 DIAGNOSIS — Z9181 History of falling: Secondary | ICD-10-CM | POA: Diagnosis not present

## 2015-08-15 DIAGNOSIS — I771 Stricture of artery: Secondary | ICD-10-CM | POA: Insufficient documentation

## 2015-08-15 DIAGNOSIS — I10 Essential (primary) hypertension: Secondary | ICD-10-CM | POA: Diagnosis not present

## 2015-08-15 DIAGNOSIS — H9190 Unspecified hearing loss, unspecified ear: Secondary | ICD-10-CM | POA: Insufficient documentation

## 2015-08-15 DIAGNOSIS — R296 Repeated falls: Secondary | ICD-10-CM | POA: Insufficient documentation

## 2015-08-15 DIAGNOSIS — N401 Enlarged prostate with lower urinary tract symptoms: Secondary | ICD-10-CM | POA: Diagnosis not present

## 2015-08-15 DIAGNOSIS — E785 Hyperlipidemia, unspecified: Secondary | ICD-10-CM | POA: Insufficient documentation

## 2015-08-15 DIAGNOSIS — Z7902 Long term (current) use of antithrombotics/antiplatelets: Secondary | ICD-10-CM | POA: Insufficient documentation

## 2015-08-15 DIAGNOSIS — Z8673 Personal history of transient ischemic attack (TIA), and cerebral infarction without residual deficits: Secondary | ICD-10-CM | POA: Diagnosis not present

## 2015-08-15 DIAGNOSIS — Z7982 Long term (current) use of aspirin: Secondary | ICD-10-CM | POA: Insufficient documentation

## 2015-08-15 DIAGNOSIS — E139 Other specified diabetes mellitus without complications: Secondary | ICD-10-CM | POA: Diagnosis not present

## 2015-08-15 DIAGNOSIS — I480 Paroxysmal atrial fibrillation: Secondary | ICD-10-CM | POA: Diagnosis not present

## 2015-08-15 DIAGNOSIS — Z79899 Other long term (current) drug therapy: Secondary | ICD-10-CM | POA: Insufficient documentation

## 2015-08-15 DIAGNOSIS — I69398 Other sequelae of cerebral infarction: Secondary | ICD-10-CM | POA: Diagnosis not present

## 2015-08-15 DIAGNOSIS — E119 Type 2 diabetes mellitus without complications: Secondary | ICD-10-CM | POA: Diagnosis not present

## 2015-08-15 DIAGNOSIS — I951 Orthostatic hypotension: Secondary | ICD-10-CM | POA: Diagnosis not present

## 2015-08-15 DIAGNOSIS — Z8572 Personal history of non-Hodgkin lymphomas: Secondary | ICD-10-CM | POA: Diagnosis not present

## 2015-08-15 DIAGNOSIS — G600 Hereditary motor and sensory neuropathy: Secondary | ICD-10-CM | POA: Diagnosis not present

## 2015-08-15 DIAGNOSIS — Z87891 Personal history of nicotine dependence: Secondary | ICD-10-CM | POA: Insufficient documentation

## 2015-08-15 DIAGNOSIS — I4891 Unspecified atrial fibrillation: Secondary | ICD-10-CM | POA: Diagnosis not present

## 2015-08-15 DIAGNOSIS — I6503 Occlusion and stenosis of bilateral vertebral arteries: Secondary | ICD-10-CM | POA: Diagnosis not present

## 2015-08-15 DIAGNOSIS — I639 Cerebral infarction, unspecified: Secondary | ICD-10-CM

## 2015-08-15 DIAGNOSIS — Z794 Long term (current) use of insulin: Secondary | ICD-10-CM | POA: Diagnosis not present

## 2015-08-15 DIAGNOSIS — IMO0001 Reserved for inherently not codable concepts without codable children: Secondary | ICD-10-CM | POA: Insufficient documentation

## 2015-08-15 DIAGNOSIS — G450 Vertebro-basilar artery syndrome: Secondary | ICD-10-CM | POA: Insufficient documentation

## 2015-08-15 LAB — CBC WITH DIFFERENTIAL/PLATELET
Basophils Absolute: 0 10*3/uL (ref 0.0–0.1)
Basophils Relative: 0 %
EOS ABS: 0.1 10*3/uL (ref 0.0–0.7)
EOS PCT: 1 %
HCT: 46.6 % (ref 39.0–52.0)
HEMOGLOBIN: 15.5 g/dL (ref 13.0–17.0)
LYMPHS ABS: 0.6 10*3/uL — AB (ref 0.7–4.0)
LYMPHS PCT: 5 %
MCH: 29.7 pg (ref 26.0–34.0)
MCHC: 33.3 g/dL (ref 30.0–36.0)
MCV: 89.3 fL (ref 78.0–100.0)
MONOS PCT: 10 %
Monocytes Absolute: 1.4 10*3/uL — ABNORMAL HIGH (ref 0.1–1.0)
Neutro Abs: 11.6 10*3/uL — ABNORMAL HIGH (ref 1.7–7.7)
Neutrophils Relative %: 84 %
PLATELETS: 146 10*3/uL — AB (ref 150–400)
RBC: 5.22 MIL/uL (ref 4.22–5.81)
RDW: 13.2 % (ref 11.5–15.5)
WBC: 13.7 10*3/uL — ABNORMAL HIGH (ref 4.0–10.5)

## 2015-08-15 LAB — COMPREHENSIVE METABOLIC PANEL
ALBUMIN: 3.3 g/dL — AB (ref 3.5–5.0)
ALK PHOS: 59 U/L (ref 38–126)
ALT: 24 U/L (ref 17–63)
ANION GAP: 10 (ref 5–15)
AST: 20 U/L (ref 15–41)
BILIRUBIN TOTAL: 0.4 mg/dL (ref 0.3–1.2)
BUN: 17 mg/dL (ref 6–20)
CALCIUM: 8.8 mg/dL — AB (ref 8.9–10.3)
CO2: 26 mmol/L (ref 22–32)
Chloride: 103 mmol/L (ref 101–111)
Creatinine, Ser: 0.71 mg/dL (ref 0.61–1.24)
GFR calc Af Amer: >60 mL/min (ref >60)
GFR calc non Af Amer: >60 mL/min (ref >60)
GLUCOSE: 107 mg/dL — AB (ref 65–99)
Potassium: 4.4 mmol/L (ref 3.5–5.1)
Sodium: 139 mmol/L (ref 135–145)
TOTAL PROTEIN: 6.5 g/dL (ref 6.5–8.1)

## 2015-08-15 LAB — GLUCOSE, CAPILLARY: Glucose-Capillary: 162 mg/dL — ABNORMAL HIGH (ref 65–99)

## 2015-08-15 LAB — I-STAT TROPONIN, ED: Troponin i, poc: 0.01 ng/mL (ref 0.00–0.08)

## 2015-08-15 LAB — URINE MICROSCOPIC-ADD ON

## 2015-08-15 LAB — URINALYSIS, ROUTINE W REFLEX MICROSCOPIC
BILIRUBIN URINE: NEGATIVE
GLUCOSE, UA: NEGATIVE mg/dL
Ketones, ur: NEGATIVE mg/dL
Nitrite: POSITIVE — AB
PH: 6.5 (ref 5.0–8.0)
Protein, ur: 30 mg/dL — AB
SPECIFIC GRAVITY, URINE: 1.015 (ref 1.005–1.030)

## 2015-08-15 LAB — I-STAT CHEM 8, ED
BUN: 20 mg/dL (ref 6–20)
CALCIUM ION: 1.09 mmol/L — AB (ref 1.13–1.30)
Chloride: 99 mmol/L — ABNORMAL LOW (ref 101–111)
Creatinine, Ser: 0.7 mg/dL (ref 0.61–1.24)
Glucose, Bld: 101 mg/dL — ABNORMAL HIGH (ref 65–99)
HCT: 50 % (ref 39.0–52.0)
HEMOGLOBIN: 17 g/dL (ref 13.0–17.0)
Potassium: 4.3 mmol/L (ref 3.5–5.1)
SODIUM: 139 mmol/L (ref 135–145)
TCO2: 25 mmol/L (ref 0–100)

## 2015-08-15 LAB — RAPID URINE DRUG SCREEN, HOSP PERFORMED
AMPHETAMINES: NOT DETECTED
BENZODIAZEPINES: NOT DETECTED
Barbiturates: NOT DETECTED
COCAINE: NOT DETECTED
OPIATES: NOT DETECTED
Tetrahydrocannabinol: NOT DETECTED

## 2015-08-15 LAB — PROTIME-INR
INR: 1.11 (ref 0.00–1.49)
PROTHROMBIN TIME: 14.4 s (ref 11.6–15.2)

## 2015-08-15 LAB — ETHANOL: Alcohol, Ethyl (B): <5 mg/dL (ref <5)

## 2015-08-15 LAB — APTT: aPTT: 29 seconds (ref 24–37)

## 2015-08-15 LAB — CBG MONITORING, ED: GLUCOSE-CAPILLARY: 87 mg/dL (ref 65–99)

## 2015-08-15 MED ORDER — ACETAMINOPHEN 325 MG PO TABS: 650.0000 mg | ORAL_TABLET | Freq: Four times a day (QID) | ORAL | Status: DC | PRN

## 2015-08-15 MED ORDER — STROKE: EARLY STAGES OF RECOVERY BOOK
Freq: Once | Status: DC
  Filled 2015-08-15: qty 1

## 2015-08-15 MED ORDER — METOPROLOL TARTRATE 25 MG PO TABS
25.0000 mg | ORAL_TABLET | Freq: Two times a day (BID) | ORAL | Status: DC
  Administered 2015-08-15 – 2015-08-17 (×4): 25 mg via ORAL
  Filled 2015-08-15 (×4): qty 1

## 2015-08-15 MED ORDER — ASPIRIN 325 MG PO TABS
325.0000 mg | ORAL_TABLET | Freq: Once | ORAL | Status: AC
  Administered 2015-08-15: 325 mg via ORAL
  Filled 2015-08-15: qty 1

## 2015-08-15 MED ORDER — ACETAMINOPHEN 325 MG PO TABS
650.0000 mg | ORAL_TABLET | Freq: Once | ORAL | Status: AC
Start: 2015-08-15 — End: 2015-08-15
  Administered 2015-08-15: 650 mg via ORAL
  Filled 2015-08-15: qty 2

## 2015-08-15 MED ORDER — AMLODIPINE BESYLATE 5 MG PO TABS
5.0000 mg | ORAL_TABLET | Freq: Every day | ORAL | Status: DC
  Administered 2015-08-15 – 2015-08-16 (×2): 5 mg via ORAL
  Filled 2015-08-15 (×3): qty 1

## 2015-08-15 MED ORDER — DEXTROSE 5 % IV SOLN
1.0000 g | Freq: Once | INTRAVENOUS | Status: AC
  Administered 2015-08-15: 1 g via INTRAVENOUS
  Filled 2015-08-15: qty 10

## 2015-08-15 MED ORDER — SENNOSIDES-DOCUSATE SODIUM 8.6-50 MG PO TABS: 1.0000 | ORAL_TABLET | Freq: Every evening | ORAL | Status: DC | PRN

## 2015-08-15 MED ORDER — HEPARIN SODIUM (PORCINE) 5000 UNIT/ML IJ SOLN
5000.0000 [IU] | Freq: Three times a day (TID) | INTRAMUSCULAR | Status: DC
  Administered 2015-08-15 – 2015-08-19 (×11): 5000 [IU] via SUBCUTANEOUS
  Filled 2015-08-15 (×11): qty 1

## 2015-08-15 MED ORDER — TAMSULOSIN HCL 0.4 MG PO CAPS
0.4000 mg | ORAL_CAPSULE | Freq: Every day | ORAL | Status: DC
  Administered 2015-08-15 – 2015-08-16 (×2): 0.4 mg via ORAL
  Filled 2015-08-15 (×2): qty 1

## 2015-08-15 MED ORDER — SIMVASTATIN 20 MG PO TABS
20.0000 mg | ORAL_TABLET | Freq: Every day | ORAL | Status: DC
  Administered 2015-08-15 – 2015-08-18 (×4): 20 mg via ORAL
  Filled 2015-08-15 (×4): qty 1

## 2015-08-15 MED ORDER — CEFTRIAXONE SODIUM 1 G IJ SOLR
1.0000 g | INTRAMUSCULAR | Status: DC
  Administered 2015-08-16 – 2015-08-18 (×3): 1 g via INTRAVENOUS
  Filled 2015-08-15 (×4): qty 10

## 2015-08-15 MED ORDER — LOPERAMIDE HCL 2 MG PO CAPS: 2.0000 mg | ORAL_CAPSULE | Freq: Every day | ORAL | Status: DC | PRN

## 2015-08-15 MED ORDER — SODIUM CHLORIDE 0.9 % IV SOLN
INTRAVENOUS | Status: DC
  Administered 2015-08-15: 23:00:00 via INTRAVENOUS

## 2015-08-15 MED ORDER — INSULIN ASPART 100 UNIT/ML ~~LOC~~ SOLN
0.0000 [IU] | Freq: Three times a day (TID) | SUBCUTANEOUS | Status: DC
Start: 2015-08-15 — End: 2015-08-19
  Administered 2015-08-16: 7 [IU] via SUBCUTANEOUS
  Administered 2015-08-16: 1 [IU] via SUBCUTANEOUS
  Administered 2015-08-17: 5 [IU] via SUBCUTANEOUS
  Administered 2015-08-17 – 2015-08-18 (×3): 2 [IU] via SUBCUTANEOUS
  Administered 2015-08-18: 5 [IU] via SUBCUTANEOUS
  Administered 2015-08-18: 9 [IU] via SUBCUTANEOUS
  Administered 2015-08-19: 2 [IU] via SUBCUTANEOUS

## 2015-08-15 MED ORDER — CLOPIDOGREL BISULFATE 75 MG PO TABS
75.0000 mg | ORAL_TABLET | Freq: Every day | ORAL | Status: DC
  Administered 2015-08-15 – 2015-08-19 (×5): 75 mg via ORAL
  Filled 2015-08-15 (×5): qty 1

## 2015-08-15 MED ORDER — SACCHAROMYCES BOULARDII 250 MG PO CAPS
250.0000 mg | ORAL_CAPSULE | Freq: Two times a day (BID) | ORAL | Status: DC
  Administered 2015-08-15 – 2015-08-19 (×8): 250 mg via ORAL
  Filled 2015-08-15 (×8): qty 1

## 2015-08-15 MED ORDER — ASPIRIN 300 MG RE SUPP: 300.0000 mg | Freq: Once | RECTAL | Status: AC

## 2015-08-15 MED ORDER — LISINOPRIL 10 MG PO TABS
20.0000 mg | ORAL_TABLET | ORAL | Status: DC
  Administered 2015-08-16: 20 mg via ORAL
  Filled 2015-08-15: qty 2

## 2015-08-15 MED ORDER — INSULIN GLARGINE 100 UNIT/ML ~~LOC~~ SOLN
15.0000 [IU] | Freq: Every day | SUBCUTANEOUS | Status: DC
  Administered 2015-08-15 – 2015-08-19 (×5): 15 [IU] via SUBCUTANEOUS
  Filled 2015-08-15 (×6): qty 0.15

## 2015-08-15 NOTE — ED Provider Notes (Signed)
CSN: [id number]     Arrival date & time 08/15/15  0815 History   First MD Initiated Contact with Patient 08/15/15 [phone]     Chief Complaint  Patient presents with  . Weakness     (Consider location/radiation/quality/duration/timing/severity/associated sxs/prior Treatment) HPI   Patient is a 75 year old male past medical history of hypertension, diabetes, A. Fib (on plavix), and CVA (2010) who presents to the ED via EMS with complaint of weakness, onset 6 AM. Pt reports when he woke up this morning and got out of bed he felt "dizzy" upon standing and with ambulating which he further describes as feeling "unsteady". He notes his symptoms are mildly relieved upon sitting or laying supine. He notes when he was up walking around at home he was unsteady multiple times and felt weak in his legs but notes he was able to sit down in his chair to prevent himself from falling. Denies any falls, head injury or LOC. Patient reports that he walks with a cane at baseline. He notes he had similar symptoms in 2010 when he was diagnosed with a stroke but notes they were significantly worse at that time. Patient states he felt fine last night prior to going to bed around 8:30 PM. Patient denies any pain or complaints at this time. Denies fever, chills, headache, visual changes, cough, shortness of breath, chest pain, palpitations, abdominal pain, nausea, vomiting, diarrhea, urinary symptoms, numbness, tingling, weakness. Patient notes that he lives at home alone.  Past Medical History  Diagnosis Date  . 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 Hypertension   . Diabetes mellitus   . A-fib ([place])   . Cataract   . Charcot-Marie-Tooth disease   . 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 Stroke [facility])    History reviewed. No pertinent past surgical history. Family History  Problem Relation Age of Onset  . Hypertension Mother   . Hypertension Father    Social History   Substance Use Topics  . Smoking status: Former Smoker -- 2.00 packs/day for 35 years    Quit date: 06/03/1991  . Smokeless tobacco: Former Systems developer    Quit date: 06/26/1990  . Alcohol Use: No    Review of Systems  Musculoskeletal: Positive for gait problem (unsteady).  Neurological: Positive for dizziness and weakness.  All other systems reviewed and are negative.     Allergies  Actos; Hydrochlorothiazide; and Penicillins  Home Medications   Prior to Admission medications   Medication Sig Start Date End Date Taking? Authorizing Provider  ACCU-CHEK AVIVA PLUS test strip 1 each by Other route as needed (blod sugar).  05/13/13  Yes Historical Provider, MD  amLODipine (NORVASC) 5 MG tablet Take 5 mg by mouth daily.  05/01/13  Yes Historical Provider, MD  aspirin 325 MG tablet Take 325 mg by mouth every Monday, Wednesday, and Friday.    Yes Historical Provider, MD  clopidogrel (PLAVIX) 75 MG tablet Take 75 mg by mouth daily.   Yes Historical Provider, MD  insulin glargine (LANTUS) 100 UNIT/ML injection Inject 25 Units into the skin daily.    Yes Historical Provider, MD  lisinopril (PRINIVIL,ZESTRIL) 20 MG tablet Take 20 mg by mouth every morning.    Yes Historical Provider, MD  loperamide (IMODIUM) 2 MG capsule Take 2 mg by mouth daily as needed for diarrhea or loose stools.   Yes Historical Provider, MD  metFORMIN (GLUCOPHAGE) 500 MG tablet Take 500 mg by mouth daily  with breakfast.  05/10/13  Yes Historical Provider, MD  metoprolol tartrate (LOPRESSOR) 25 MG tablet Take 25 mg by mouth 2 (two) times daily.  03/28/11  Yes Historical Provider, MD  saccharomyces boulardii (FLORASTOR) 250 MG capsule Take 1 capsule (250 mg total) by mouth 2 (two) times daily. 09/18/13  Yes Thurnell Lose, MD  simvastatin (ZOCOR) 20 MG tablet Take 20 mg by mouth at bedtime.     Yes Historical Provider, MD  tamsulosin (FLOMAX) 0.4 MG CAPS capsule Take 1 capsule (0.4 mg total) by mouth daily. 06/05/14  Yes  Charlynne Cousins, MD  silver sulfADIAZINE (SILVADENE) 1 % cream Apply 1 application topically daily. Patient not taking: Reported on 08/15/2015 02/09/14   Harriet Masson, DPM  sulfamethoxazole-trimethoprim (BACTRIM DS,SEPTRA DS) 800-160 MG per tablet Take 1 tablet by mouth every 12 (twelve) hours. Patient not taking: Reported on 08/15/2015 06/05/14   Charlynne Cousins, MD   BP 145/56 mmHg  Pulse 84  Temp(Src) 99.8 F (37.7 C) (Oral)  Resp 23  SpO2 96% Physical Exam  Constitutional: He is oriented to person, place, and time. He appears well-developed and well-nourished. No distress.  HENT:  Head: Normocephalic and atraumatic.  Right Ear: Tympanic membrane normal.  Left Ear: Tympanic membrane normal.  Nose: Nose normal.  Mouth/Throat: Uvula is midline, oropharynx is clear and moist and mucous membranes are normal. No oropharyngeal exudate.  Eyes: Conjunctivae and EOM are normal. Pupils are equal, round, and reactive to light. Right eye exhibits no discharge. Left eye exhibits no discharge. No scleral icterus.  Neck: Normal range of motion. Neck supple.  Cardiovascular: Normal rate, regular rhythm, normal heart sounds and intact distal pulses.   Pulmonary/Chest: Effort normal and breath sounds normal. No respiratory distress. He has no wheezes. He has no rales. He exhibits no tenderness.  Abdominal: Soft. Bowel sounds are normal. He exhibits no distension and no mass. There is no tenderness. There is no rebound and no guarding.  Musculoskeletal: Normal range of motion. He exhibits no edema or tenderness.  Lymphadenopathy:    He has no cervical adenopathy.  Neurological: He is alert and oriented to person, place, and time. He has normal strength and normal reflexes. No cranial nerve deficit or sensory deficit. He displays a negative Romberg sign. Coordination (bilateral dysmetria) abnormal.  FROM of BUE and BLE with 5/5 strength. When pt moves from supine to sitting position he reports  having dizziness. Pt unable to stand without assistance and is leaning to the right.  Skin: Skin is warm and dry. He is not diaphoretic.  Nursing note and vitals reviewed.   ED Course  Procedures (including critical care time) Labs Review Labs Reviewed  COMPREHENSIVE METABOLIC PANEL - Abnormal; Notable for the following:    Glucose, Bld 107 (*)    Calcium 8.8 (*)    Albumin 3.3 (*)    All other components within normal limits  URINALYSIS, ROUTINE W REFLEX MICROSCOPIC (NOT AT [facility]) - Abnormal; Notable for the following:    APPearance CLOUDY (*)    Hgb urine dipstick TRACE (*)    Protein, ur 30 (*)    Nitrite POSITIVE (*)    Leukocytes, UA MODERATE (*)    All other components within normal limits  CBC WITH DIFFERENTIAL/PLATELET - Abnormal; Notable for the following:    WBC 13.7 (*)    Platelets 146 (*)    Neutro Abs 11.6 (*)    Lymphs Abs 0.6 (*)    Monocytes Absolute 1.4 (*)  All other components within normal limits  URINE MICROSCOPIC-ADD ON - Abnormal; Notable for the following:    Squamous Epithelial / LPF 6-30 (*)    Bacteria, UA MANY (*)    All other components within normal limits  I-STAT CHEM 8, ED - Abnormal; Notable for the following:    Chloride 99 (*)    Glucose, Bld 101 (*)    Calcium, Ion 1.09 (*)    All other components within normal limits  URINE CULTURE  ETHANOL  PROTIME-INR  APTT  URINE RAPID DRUG SCREEN, HOSP PERFORMED  I-STAT TROPOININ, ED  CBG MONITORING, ED    Imaging Review Ct Head Wo Contrast  08/15/2015  CLINICAL DATA:  Weakness and dizziness.  History of stroke EXAM: CT HEAD WITHOUT CONTRAST TECHNIQUE: Contiguous axial images were obtained from the base of the skull through the vertex without intravenous contrast. COMPARISON:  05/29/2014 FINDINGS: Skull and Sinuses:Negative for fracture or destructive process. Small, benign left frontal calvarium osteoma. Mild mucosal thickening in the paranasal sinuses. No fluid level. No mastoiditis to  correlate with dizziness. Visualized orbits: Optic drusen, usually incidental. Bilateral cataract resection. Brain: Atrophy, asymmetrically advanced to the cerebellum, similar to 2015. Remote small vessel infarct in the upper left cerebellar hemisphere. Patchy small vessel ischemic change in the cerebral white matter, greatest around the lateral ventricles. No evidence of acute infarct, hemorrhage, hydrocephalus, or mass lesion. Calcified intracranial atherosclerosis. IMPRESSION: 1. No acute finding or definitive change since 2015. 2. Cerebellar predominant atrophy. 3. 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 Electronically Signed   By: Monte Fantasia M.D.   On: 08/15/2015 09:29   Mr Brain Wo Contrast  08/15/2015  CLINICAL DATA:  Weakness.  Unsteady gait.  Prior stroke. EXAM: MRI HEAD WITHOUT CONTRAST MRA HEAD WITHOUT CONTRAST TECHNIQUE: Multiplanar, multiecho pulse sequences of the brain and surrounding structures were obtained without intravenous contrast. Angiographic images of the head were obtained using MRA technique without contrast. COMPARISON:  Head CT 08/15/2015, MRI 05/30/2014, and MRA 12/03/2008. Cerebral angiograms 12/04/2008 and 12/19/2008. FINDINGS: MRI HEAD FINDINGS There is no evidence of acute infarct, mass, midline shift, or extra-axial fluid collection. A small, chronic hemorrhage in the left occipital lobe is unchanged. Patchy T2 hyperintensities in the periventricular greater than subcortical cerebral white matter bilaterally are similar to the prior MRI and compatible with moderate chronic small vessel ischemic disease. Chronic infarcts are again seen in the brainstem and left cerebellum. There is moderate cerebral and cerebellar atrophy. Prior bilateral cataract extraction is noted. Mild paranasal sinus mucosal thickening is present. Mastoid air cells are clear. Major intracranial vascular flow voids are preserved. A chronic white matter lacunar  infarct in the left parietal lobe is unchanged. MRA HEAD FINDINGS There is very poor signal in the distal vertebral arteries and basilar artery with only minimal intermittent flow related enhancement seen. The appearance is similar to that on the prior MRA, with subsequent angiogram at that time demonstrating severe vertebral artery stenoses bilaterally. Right PICA appears patent. There are patent posterior communicating arteries bilaterally which provides the dominant supply the PCAs. No significant proximal PCA stenosis is identified allowing for mild motion artifact throughout this region. The internal carotid arteries are patent from skullbase to carotid termini with mild irregularity noted bilaterally as well as mild narrowing of the proximal portions of both cavernous segments, similar to prior. The right A1 segment is hypoplastic. Left A1 segment is dominant without significant stenosis. Mild right P1 narrowing is unchanged, as  is moderate proximal right M2 narrowing near the bifurcation. No significant proximal left MCA stenosis is seen. No intracranial aneurysm is identified. IMPRESSION: 1. No acute intracranial abnormality. 2. Moderate chronic small vessel ischemic disease and cerebral atrophy with chronic infarcts as above. 3. Minimal flow related enhancement in the distal vertebral arteries and basilar artery, similar to the prior MRA. Severe bilateral vertebral artery stenoses demonstrated on prior catheter angiogram. 4. Mild bilateral ICA, mild right M1, and moderate right M2 stenoses without significant change. Electronically Signed   By: Logan Bores M.D.   On: 08/15/2015 15:59   [name] Head/brain Wo Cm  08/15/2015  CLINICAL DATA:  Weakness.  Unsteady gait.  Prior stroke. EXAM: MRI HEAD WITHOUT CONTRAST MRA HEAD WITHOUT CONTRAST TECHNIQUE: Multiplanar, multiecho pulse sequences of the brain and surrounding structures were obtained without intravenous contrast. Angiographic images of the head were  obtained using MRA technique without contrast. COMPARISON:  Head CT 08/15/2015, MRI 05/30/2014, and MRA 12/03/2008. Cerebral angiograms 12/04/2008 and 12/19/2008. FINDINGS: MRI HEAD FINDINGS There is no evidence of acute infarct, mass, midline shift, or extra-axial fluid collection. A small, chronic hemorrhage in the left occipital lobe is unchanged. Patchy T2 hyperintensities in the periventricular greater than subcortical cerebral white matter bilaterally are similar to the prior MRI and compatible with moderate chronic small vessel ischemic disease. Chronic infarcts are again seen in the brainstem and left cerebellum. There is moderate cerebral and cerebellar atrophy. Prior bilateral cataract extraction is noted. Mild paranasal sinus mucosal thickening is present. Mastoid air cells are clear. Major intracranial vascular flow voids are preserved. A chronic white matter lacunar infarct in the left parietal lobe is unchanged. MRA HEAD FINDINGS There is very poor signal in the distal vertebral arteries and basilar artery with only minimal intermittent flow related enhancement seen. The appearance is similar to that on the prior MRA, with subsequent angiogram at that time demonstrating severe vertebral artery stenoses bilaterally. Right PICA appears patent. There are patent posterior communicating arteries bilaterally which provides the dominant supply the PCAs. No significant proximal PCA stenosis is identified allowing for mild motion artifact throughout this region. The internal carotid arteries are patent from skullbase to carotid termini with mild irregularity noted bilaterally as well as mild narrowing of the proximal portions of both cavernous segments, similar to prior. The right A1 segment is hypoplastic. Left A1 segment is dominant without significant stenosis. Mild right P1 narrowing is unchanged, as is moderate proximal right M2 narrowing near the bifurcation. No significant proximal left MCA stenosis is  seen. No intracranial aneurysm is identified. IMPRESSION: 1. No acute intracranial abnormality. 2. Moderate chronic small vessel ischemic disease and cerebral atrophy with chronic infarcts as above. 3. Minimal flow related enhancement in the distal vertebral arteries and basilar artery, similar to the prior MRA. Severe bilateral vertebral artery stenoses demonstrated on prior catheter angiogram. 4. Mild bilateral ICA, mild right M1, and moderate right M2 stenoses without significant change. Electronically Signed   By: Logan Bores M.D.   On: 08/15/2015 15:59   I have personally reviewed and evaluated these images and lab results as part of my medical decision-making.   EKG Interpretation None      MDM   Final diagnoses:  Weakness    Patient presents with weakness and unsteady gait that he first noticed upon waking up this morning at 6 AM. Hx of CVA (2010), HTN, DM and afib. VSS.  Exam remarkable for bilateral dysmetria with finger-nose-finger and heel shin, pt unable to  stand without assistance and is leaning to the right.   EKG showed sinus rhythm. UA consistent with UTI. WBC 13.7. Remaining labs unremarkable.  10:10 - Consulted neurology. Dr. Cheral Marker reports he will come evaluate the pt in the ED.   Consulted hospitalist for admission. Dr. Marvel Plan agrees to admission. Orders placed for tele bed.  Chesley Noon [place], [place] 08/15/15 1619  Courteney Julio Alm, MD 08/15/15 [phone]

## 2015-08-15 NOTE — ED Notes (Signed)
Ordered pt a heart healthy lunch tray, per [facility]

## 2015-08-15 NOTE — Consult Note (Signed)
Requesting Physician: Dr. Thomasene Lot    Chief Complaint: stroke  HPI:                                                                                                                                         [name] is an 75 y.o. male with history of previous strokes in the past. He does take ASA but not every day. He is also on Plavix.  This AM he noted he could not stay standing up right. He mentioned he would fall back against the wall and then into a chair. Family noted he was leaning to the right and tended to fall to the right. Patient felt as if both his lags felt weak. He had no vertigo, dizziness, diplopia, weakness unilaterally or clumsiness. Currently in the bed he feels fine but when sitting up he was notably leaning to the right. He has history of Afib but in NSR.   Date last known well: yesterday Time last known well: Time: 22:00 tPA Given: No: Out of time window  Past Medical History  Diagnosis Date  . 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 Hypertension   . Diabetes mellitus   . A-fib ([place])   . Cataract   . Charcot-Marie-Tooth disease   . 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 Stroke [facility])     History reviewed. No pertinent past surgical history.  Family History  Problem Relation Age of Onset  . Hypertension Mother   . Hypertension Father    Social History:  reports that he quit smoking about 24 years ago. He quit smokeless tobacco use about 25 years ago. He reports that he does not drink alcohol or use illicit drugs.  Allergies:  Allergies  Allergen Reactions  . Actos [Pioglitazone Hydrochloride] Other (See Comments)    Afib  . Hydrochlorothiazide Other (See Comments)    Lower potassium to low  . Penicillins Rash    Tolerated cefepime, ceftriaxone    Medications:                                                                                                                           No current  facility-administered medications for this encounter.   Current Outpatient Prescriptions  Medication Sig Dispense Refill  . ACCU-CHEK AVIVA PLUS test strip 1  each by Other route as needed (blod sugar).     [name] amLODipine (NORVASC) 5 MG tablet Take 5 mg by mouth daily.     [name] aspirin 325 MG tablet Take 325 mg by mouth every Monday, Wednesday, and Friday.     . clopidogrel (PLAVIX) 75 MG tablet Take 75 mg by mouth daily.    . insulin glargine (LANTUS) 100 UNIT/ML injection Inject 25 Units into the skin daily.     [name] lisinopril (PRINIVIL,ZESTRIL) 20 MG tablet Take 20 mg by mouth every morning.     . loperamide (IMODIUM) 2 MG capsule Take 2 mg by mouth daily as needed for diarrhea or loose stools.    . metFORMIN (GLUCOPHAGE) 500 MG tablet Take 500 mg by mouth daily with breakfast.     . metoprolol tartrate (LOPRESSOR) 25 MG tablet Take 25 mg by mouth 2 (two) times daily.     [name] saccharomyces boulardii (FLORASTOR) 250 MG capsule Take 1 capsule (250 mg total) by mouth 2 (two) times daily. 30 capsule 0  . simvastatin (ZOCOR) 20 MG tablet Take 20 mg by mouth at bedtime.      . tamsulosin (FLOMAX) 0.4 MG CAPS capsule Take 1 capsule (0.4 mg total) by mouth daily. 30 capsule 0  . silver sulfADIAZINE (SILVADENE) 1 % cream Apply 1 application topically daily. (Patient not taking: Reported on 08/15/2015) 50 g 1  . sulfamethoxazole-trimethoprim (BACTRIM DS,SEPTRA DS) 800-160 MG per tablet Take 1 tablet by mouth every 12 (twelve) hours. (Patient not taking: Reported on 08/15/2015) 14 tablet 0    ROS:                                                                                                                                       History obtained from the patient  General ROS: negative for - chills, fatigue, fever, night sweats, weight gain or weight loss Psychological ROS: negative for - behavioral disorder, hallucinations, memory difficulties, mood swings or suicidal ideation Ophthalmic ROS: negative for -  blurry vision, double vision, eye pain or loss of vision ENT ROS: negative for - epistaxis, nasal discharge, oral lesions, sore throat, tinnitus or vertigo Allergy and Immunology ROS: negative for - hives or itchy/watery eyes Hematological and Lymphatic ROS: negative for - bleeding problems, bruising or swollen lymph nodes Endocrine ROS: negative for - galactorrhea, hair pattern changes, polydipsia/polyuria or temperature intolerance Respiratory ROS: negative for - cough, hemoptysis, shortness of breath or wheezing Cardiovascular ROS: negative for - chest pain, dyspnea on exertion, edema or irregular heartbeat Gastrointestinal ROS: negative for - abdominal pain, diarrhea, hematemesis, nausea/vomiting or stool incontinence Genito-Urinary ROS: negative for - dysuria, hematuria, incontinence or urinary frequency/urgency Musculoskeletal ROS: negative for - joint swelling or muscular weakness Neurological ROS: as noted in HPI Dermatological ROS: negative for rash and skin lesion changes  Neurologic Examination:  Blood pressure 125/54, pulse 86, temperature 99.2 F (37.3 C), temperature source Oral, resp. rate 21, SpO2 95 %.  HEENT-  Normocephalic, no lesions, without obvious abnormality.  Normal external eye and conjunctiva.  Normal TM's bilaterally.  Normal auditory canals and external ears. Normal external nose, mucus membranes and septum.  Normal pharynx. Cardiovascular- S1, S2 normal, pulses palpable throughout   Lungs- chest clear, no wheezing, rales, normal symmetric air entry Abdomen- normal findings: liver span normal to percussion Extremities- distal hemosideron staining Lymph-no adenopathy palpable Musculoskeletal-no joint tenderness, deformity or swelling Skin-warm and dry, no hyperpigmentation, vitiligo, or suspicious lesions  Neurological Examination Mental Status: Alert, oriented,  thought content appropriate.  Speech fluent without evidence of aphasia.  Able to follow 3 step commands without difficulty. Cranial Nerves: II:  Visual fields grossly normal, pupils equal, round, reactive to light and accommodation III,IV, VI: ptosis in left eye, extra-ocular motions intact bilaterally V,VII: smile symmetric, facial light touch sensation normal bilaterally VIII: hearing normal bilaterally IX,X: uvula rises symmetrically XI: bilateral shoulder shrug XII: midline tongue extension Motor: Right : Upper extremity   5/5    Left:     Upper extremity   5/5  Lower extremity   4/5     Lower extremity   5/5 Tone and bulk:normal tone throughout; no atrophy noted Sensory: Pinprick and light touch intact throughout, bilaterally with distal neuropathy Deep Tendon Reflexes: 2+ and symmetric throughout UE and KJ no AJ Plantars: Right: downgoing   Left: downgoing Cerebellar: normal finger-to-nose, right H-S dysmetric Gait: not tested  Lab Results: Basic Metabolic Panel:  Recent Labs Lab 08/15/15 0854 08/15/15 0903  NA 139 139  K 4.4 4.3  CL 103 99*  CO2 26  --   GLUCOSE 107* 101*  BUN 17 20  CREATININE 0.71 0.70  CALCIUM 8.8*  --     Liver Function Tests:  Recent Labs Lab 08/15/15 0854  AST 20  ALT 24  ALKPHOS 59  BILITOT 0.4  PROT 6.5  ALBUMIN 3.3*   No results for input(s): LIPASE, AMYLASE in the last 168 hours. No results for input(s): AMMONIA in the last 168 hours.  CBC:  Recent Labs Lab 08/15/15 0854 08/15/15 0903  WBC 13.7*  --   NEUTROABS 11.6*  --   HGB 15.5 17.0  HCT 46.6 50.0  MCV 89.3  --   PLT 146*  --     Cardiac Enzymes: No results for input(s): CKTOTAL, CKMB, CKMBINDEX, TROPONINI in the last 168 hours.  Lipid Panel: No results for input(s): CHOL, TRIG, HDL, CHOLHDL, VLDL, LDLCALC in the last 168 hours.  CBG: No results for input(s): GLUCAP in the last 168 hours.  Microbiology: Results for orders placed or performed during the  hospital encounter of 05/29/14  Blood Culture (routine x 2)     Status: None   Collection Time: 05/29/14  8:40 PM  Result Value Ref Range Status   Specimen Description BLOOD ARM LEFT  Final   Special Requests BOTTLES DRAWN AEROBIC AND ANAEROBIC 10CC  Final   Culture  Setup Time   Final    05/30/2014 00:57 Performed at [place]   Final    NO GROWTH 5 DAYS Performed at [facility]    Report Status 06/05/2014 FINAL  Final  Blood Culture (routine x 2)     Status: None   Collection Time: 05/29/14  8:50 PM  Result Value Ref Range Status   Specimen Description BLOOD ARM RIGHT  Final   Special Requests BOTTLES DRAWN AEROBIC AND ANAEROBIC 10CC  Final   Culture  Setup Time   Final    05/30/2014 00:58 Performed at [facility]    Culture   Final    NO GROWTH 5 DAYS Performed at [facility]    Report Status 06/05/2014 FINAL  Final  Urine culture     Status: None   Collection Time: 05/29/14  9:25 PM  Result Value Ref Range Status   Specimen Description URINE, CATHETERIZED  Final   Special Requests NONE  Final   Culture  Setup Time   Final    05/30/2014 04:14 Performed at [place] Performed at [facility]   Final   Culture NO GROWTH Performed at [facility]   Final   Report Status 05/31/2014 FINAL  Final    Coagulation Studies:  Recent Labs  08/15/15 0854  LABPROT 14.4  INR 1.11    Imaging: Ct Head Wo Contrast  08/15/2015  CLINICAL DATA:  Weakness and dizziness.  History of stroke EXAM: CT HEAD WITHOUT CONTRAST TECHNIQUE: Contiguous axial images were obtained from the base of the skull through the vertex without intravenous contrast. COMPARISON:  05/29/2014 FINDINGS: Skull and Sinuses:Negative for fracture or destructive process. Small, benign left frontal calvarium osteoma. Mild mucosal thickening in the paranasal sinuses. No fluid level. No mastoiditis to correlate  with dizziness. Visualized orbits: Optic drusen, usually incidental. Bilateral cataract resection. Brain: Atrophy, asymmetrically advanced to the cerebellum, similar to 2015. Remote small vessel infarct in the upper left cerebellar hemisphere. Patchy small vessel ischemic change in the cerebral white matter, greatest around the lateral ventricles. No evidence of acute infarct, hemorrhage, hydrocephalus, or mass lesion. Calcified intracranial atherosclerosis. IMPRESSION: 1. No acute finding or definitive change since 2015. 2. Cerebellar predominant atrophy. 3. 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 Electronically Signed   By: Monte Fantasia M.D.   On: 08/15/2015 09:29    Assessment: 75 y.o. male  1. Acute onset of gait instability with falls to the right. Strength is relatively preserved and there is no appendicular ataxia. 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 CT reveals no acute finding or definitive change since 2015, cerebellar predominant atrophy, chronic small vessel disease and remote infarct in the upper left cerebellum. 2. History of prior strokes. On ASA and Plavix at home with partial compliance.  3. Intermittent atrial fibrillation.  4. Stroke Risk Factors include atrial fibrillation, diabetes mellitus and hypertension  Recommendations: 1. Continue ASA and Plavix. Consider starting atorvastatin, if not contraindicated from a medical standpoint 2. MRI and MRA  of the brain without contrast 3. PT consult, OT consult, Speech consult 4. Echocardiogram 5. Carotid dopplers 6. Consider starting anticoagulation for secondary stroke prevention if not medically contraindicated and if his listed history of atrial fibrillation is in fact an intermittent a-fib and not lone atrial fibrillation 7. Risk factor modification 8. Telemetry monitoring 9. Frequent neuro  checks 10 NPO until passes stroke swallow screen 11. HgbA1c, fasting lipid panel   History and exam documented by Etta Quill PA-C, [facility], [id number]  Kerney Elbe, MD 08/15/2015, 12:40 PM

## 2015-08-15 NOTE — ED Notes (Signed)
Ordered heart healthy dinner tray, per [facility]

## 2015-08-15 NOTE — ED Notes (Signed)
Pt to ER via [facility] from home. Pt woke up at 6 am this morning, upon standing patient felt weak in ankles and feet, felt that gait was unsteady. Pt reports nearly falling, but was able to sit before falling. Pt normally walks with cane, per daughter patient has increased shuffling with walking. Pt reports similar symptoms in 2010 and was found to have a stroke. Pt LSN 8:30 pm last night. Per EMS stroke screen was negative. VSS - CBG 111, BP 156/70, HR 74, RR 16, SpO2 99. A/O x4.

## 2015-08-15 NOTE — H&P (Signed)
Patient Demographics  [name], is a 75 y.o. male  MRN: [id number]   DOB - Jul 26, 1940  Admit Date - 08/15/2015  Outpatient Primary MD for the patient is Donnie Coffin, MD   With History of -  Past Medical History  Diagnosis Date  . 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 Hypertension   . Diabetes mellitus   . A-fib ([place])   . Cataract   . Charcot-Marie-Tooth disease   . 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 Stroke [facility])       History reviewed. No pertinent past surgical history.  in for   Chief Complaint  Patient presents with  . Weakness     HPI  [name]  is a 75 y.o. male, with significant past medical history of CVA, IDDM, recurrent falls, hx/o afib, hx/o lymphoma s/p resection , patient is on Plavix for his history of CVA, patient presents with complaints of weakness, relates at baseline with a cane/walker , reports generalized weakness when he woke up this morning , as well reports unsteady gait , family noticed him leaning to the right side , he denies any vertigo, dizziness, diplopia, tingling or numbness, code stroke was called in ED, CT head with no acute findings , workup significant for UTI, patient was seen by neurology service, who recommended admission for CVA, rule out.    Review of Systems    In addition to the HPI above, No Fever-chills, No Headache, No changes with Vision or hearing, No problems swallowing food or Liquids, No Chest pain, Cough or Shortness of Breath, No Abdominal pain, No Nausea or Vommitting, Bowel movements are regular, No Blood in stool or Urine, No dysuria, No new skin rashes or bruises, No new joints pains-aches,  Patient reports generalized weakness, unsteady gait, but no focal deficits No recent weight gain or loss, No polyuria, polydypsia or polyphagia, No significant Mental Stressors.  A full 10 point Review of Systems was done, except as stated above, all other  Review of Systems were negative.   Social History Social History  Substance Use Topics  . Smoking status: Former Smoker -- 2.00 packs/day for 35 years    Quit date: 06/03/1991  . Smokeless tobacco: Former Systems developer    Quit date: 06/26/1990  . Alcohol Use: No    Family History Family History  Problem Relation Age of Onset  . Hypertension Mother   . Hypertension Father      Prior to Admission medications   Medication Sig Start Date End Date Taking? Authorizing Provider  ACCU-CHEK AVIVA PLUS test strip 1 each by Other route as needed (blod sugar).  05/13/13  Yes Historical Provider, MD  amLODipine (NORVASC) 5 MG tablet Take 5 mg by mouth daily.  05/01/13  Yes Historical Provider, MD  aspirin 325 MG tablet Take 325 mg by mouth every Monday, Wednesday, and Friday.    Yes Historical Provider, MD  clopidogrel (PLAVIX) 75 MG tablet Take 75 mg by mouth daily.   Yes Historical Provider, MD  insulin glargine (LANTUS) 100 UNIT/ML injection Inject 25 Units into the skin daily.    Yes Historical Provider, MD  lisinopril (PRINIVIL,ZESTRIL) 20 MG tablet Take 20 mg by mouth every morning.    Yes Historical Provider, MD  loperamide (IMODIUM) 2 MG capsule Take 2 mg by mouth daily as needed for diarrhea or loose stools.   Yes Historical Provider, MD  metFORMIN (GLUCOPHAGE) 500 MG  tablet Take 500 mg by mouth daily with breakfast.  05/10/13  Yes Historical Provider, MD  metoprolol tartrate (LOPRESSOR) 25 MG tablet Take 25 mg by mouth 2 (two) times daily.  03/28/11  Yes Historical Provider, MD  saccharomyces boulardii (FLORASTOR) 250 MG capsule Take 1 capsule (250 mg total) by mouth 2 (two) times daily. 09/18/13  Yes Thurnell Lose, MD  simvastatin (ZOCOR) 20 MG tablet Take 20 mg by mouth at bedtime.     Yes Historical Provider, MD  tamsulosin (FLOMAX) 0.4 MG CAPS capsule Take 1 capsule (0.4 mg total) by mouth daily. 06/05/14  Yes Charlynne Cousins, MD  silver sulfADIAZINE (SILVADENE) 1 % cream Apply 1  application topically daily. Patient not taking: Reported on 08/15/2015 02/09/14   Harriet Masson, DPM  sulfamethoxazole-trimethoprim (BACTRIM DS,SEPTRA DS) 800-160 MG per tablet Take 1 tablet by mouth every 12 (twelve) hours. Patient not taking: Reported on 08/15/2015 06/05/14   Charlynne Cousins, MD    Allergies  Allergen Reactions  . Actos [Pioglitazone Hydrochloride] Other (See Comments)    Afib  . Hydrochlorothiazide Other (See Comments)    Lower potassium to low  . Penicillins Rash    Tolerated cefepime, ceftriaxone    Physical Exam  Vitals  Blood pressure 125/54, pulse 86, temperature 99.2 F (37.3 C), temperature source Oral, resp. rate 21, SpO2 95 %.   1. General frail elderly lying in bed in NAD,    2. Normal affect and insight, Not Suicidal or Homicidal, Awake Alert, Oriented X 3.  3. No F.N deficits, ALL C.Nerves Intact, motor strength grossly intact, Sensation intact all 4 extremities, normal alternating hand movement, normal finger-to-nose test Plantars down going.  4. Ears and Eyes appear Normal, Conjunctivae clear, PERRLA. Moist Oral Mucosa.  5. Supple Neck, No JVD, No cervical lymphadenopathy appriciated, No Carotid Bruits.  6. Symmetrical Chest wall movement, Good air movement bilaterally, CTAB.  7. RRR, No Gallops, Rubs or Murmurs, No Parasternal Heave.  8. Positive Bowel Sounds, Abdomen Soft, No tenderness, No organomegaly appriciated,No rebound -guarding or rigidity.  9.  No Cyanosis, Normal Skin Turgor, No Skin Rash or Bruise.  10. Good muscle tone,  joints appear normal , no effusions, Normal ROM.    Data Review  CBC  Recent Labs Lab 08/15/15 0854 08/15/15 0903  WBC 13.7*  --   HGB 15.5 17.0  HCT 46.6 50.0  PLT 146*  --   MCV 89.3  --   MCH 29.7  --   MCHC 33.3  --   RDW 13.2  --   LYMPHSABS 0.6*  --   MONOABS 1.4*  --   EOSABS 0.1  --   BASOSABS 0.0  --     ------------------------------------------------------------------------------------------------------------------  Chemistries   Recent Labs Lab 08/15/15 0854 08/15/15 0903  NA 139 139  K 4.4 4.3  CL 103 99*  CO2 26  --   GLUCOSE 107* 101*  BUN 17 20  CREATININE 0.71 0.70  CALCIUM 8.8*  --   AST 20  --   ALT 24  --   ALKPHOS 59  --   BILITOT 0.4  --    ------------------------------------------------------------------------------------------------------------------ CrCl cannot be calculated (Unknown ideal weight.). ------------------------------------------------------------------------------------------------------------------ No results for input(s): TSH, T4TOTAL, T3FREE, THYROIDAB in the last 72 hours.  Invalid input(s): FREET3   Coagulation profile  Recent Labs Lab 08/15/15 0854  INR 1.11   ------------------------------------------------------------------------------------------------------------------- No results for input(s): DDIMER in the last 72 hours. -------------------------------------------------------------------------------------------------------------------  Cardiac Enzymes No results for input(s):  CKMB, TROPONINI, MYOGLOBIN in the last 168 hours.  Invalid input(s): CK ------------------------------------------------------------------------------------------------------------------ Invalid input(s): POCBNP   ---------------------------------------------------------------------------------------------------------------  Urinalysis    Component Value Date/Time   COLORURINE YELLOW 08/15/2015 0908   APPEARANCEUR CLOUDY* 08/15/2015 0908   LABSPEC 1.015 08/15/2015 0908   PHURINE 6.5 08/15/2015 0908   GLUCOSEU NEGATIVE 08/15/2015 0908   HGBUR TRACE* 08/15/2015 0908   BILIRUBINUR NEGATIVE 08/15/2015 0908   KETONESUR NEGATIVE 08/15/2015 0908   PROTEINUR 30* 08/15/2015 0908   UROBILINOGEN 0.2 05/29/2014 2125   NITRITE POSITIVE* 08/15/2015  0908   LEUKOCYTESUR MODERATE* 08/15/2015 0908    ----------------------------------------------------------------------------------------------------------------  Imaging results:   Ct Head Wo Contrast  08/15/2015  CLINICAL DATA:  Weakness and dizziness.  History of stroke EXAM: CT HEAD WITHOUT CONTRAST TECHNIQUE: Contiguous axial images were obtained from the base of the skull through the vertex without intravenous contrast. COMPARISON:  05/29/2014 FINDINGS: Skull and Sinuses:Negative for fracture or destructive process. Small, benign left frontal calvarium osteoma. Mild mucosal thickening in the paranasal sinuses. No fluid level. No mastoiditis to correlate with dizziness. Visualized orbits: Optic drusen, usually incidental. Bilateral cataract resection. Brain: Atrophy, asymmetrically advanced to the cerebellum, similar to 2015. Remote small vessel infarct in the upper left cerebellar hemisphere. Patchy small vessel ischemic change in the cerebral white matter, greatest around the lateral ventricles. No evidence of acute infarct, hemorrhage, hydrocephalus, or mass lesion. Calcified intracranial atherosclerosis. IMPRESSION: 1. No acute finding or definitive change since 2015. 2. Cerebellar predominant atrophy. 3. 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 Electronically Signed   By: Monte Fantasia M.D.   On: 08/15/2015 09:29       Assessment & Plan  Principal Problem:   Weakness Active Problems:   A-fib ([facility])   Diabetes mellitus ([facility])   UTI (lower urinary tract infection)   History of CVA (cerebrovascular accident)   Weakness/unsteady gait - Unclear actually G, patient will be admitted for CVA rule out, CT head with no acute findings, will obtain MRI brain, MRA head, 2-D echo, carotid Dopplers, will monitor on telemetry, will give one dose aspirin, continue with Plavix. - Await neuro recommendations - Physical exam nonspecific, findings may be  related to UTI as well.  UTI - Continue with IV Rocephin, follow with urine cultures  Diabetes mellitus - We'll hold oral hypoglycemic agents, will continue Lantus but at a lower dose, will add insulin sliding scale, will check hemoglobin A1c  Hypertension - Resume home medication  History of CVA - Continue with Plavix  A. Fib - Currently in sinus rhythm - Not on anticoagulation,  giving his history of multiple falls  Hyperlipidemia - Continue with statin   DVT Prophylaxis Heparin  AM Labs Ordered, also please review Full Orders  Family Communication: Admission, patients condition and plan of care including tests being ordered have been discussed with the patient and daughter who indicate understanding and agree with the plan and Code Status.  Code Status Full code  Likely DC to  : Pending PT evaluation  Condition GUARDED    Time spent in minutes : 55 minutes    Theseus Birnie M.D on 08/15/2015 at 1:22 PM  Between 7am to 7pm - Pager - [phone]  After 7pm go to [email or web address] - password [email or web address]  And look for the night coverage person covering me after hours  [facility] Office  [phone]

## 2015-08-16 ENCOUNTER — Observation Stay (HOSPITAL_BASED_OUTPATIENT_CLINIC_OR_DEPARTMENT_OTHER): Payer: Medicare Other

## 2015-08-16 DIAGNOSIS — E119 Type 2 diabetes mellitus without complications: Secondary | ICD-10-CM

## 2015-08-16 DIAGNOSIS — N390 Urinary tract infection, site not specified: Secondary | ICD-10-CM | POA: Diagnosis not present

## 2015-08-16 DIAGNOSIS — IMO0001 Reserved for inherently not codable concepts without codable children: Secondary | ICD-10-CM | POA: Insufficient documentation

## 2015-08-16 DIAGNOSIS — R296 Repeated falls: Secondary | ICD-10-CM | POA: Diagnosis not present

## 2015-08-16 DIAGNOSIS — I6789 Other cerebrovascular disease: Secondary | ICD-10-CM

## 2015-08-16 DIAGNOSIS — Z8673 Personal history of transient ischemic attack (TIA), and cerebral infarction without residual deficits: Secondary | ICD-10-CM | POA: Diagnosis not present

## 2015-08-16 DIAGNOSIS — R531 Weakness: Secondary | ICD-10-CM | POA: Diagnosis not present

## 2015-08-16 DIAGNOSIS — I480 Paroxysmal atrial fibrillation: Secondary | ICD-10-CM | POA: Diagnosis not present

## 2015-08-16 DIAGNOSIS — Z794 Long term (current) use of insulin: Secondary | ICD-10-CM

## 2015-08-16 LAB — LIPID PANEL
CHOL/HDL RATIO: 4 ratio
Cholesterol: 95 mg/dL (ref 0–200)
HDL: 24 mg/dL — ABNORMAL LOW (ref >40)
LDL Cholesterol: 58 mg/dL (ref 0–99)
Triglycerides: 67 mg/dL (ref <150)
VLDL: 13 mg/dL (ref 0–40)

## 2015-08-16 LAB — GLUCOSE, CAPILLARY
GLUCOSE-CAPILLARY: 123 mg/dL — AB (ref 65–99)
GLUCOSE-CAPILLARY: 224 mg/dL — AB (ref 65–99)
Glucose-Capillary: 88 mg/dL (ref 65–99)
Glucose-Capillary: 312 mg/dL — ABNORMAL HIGH (ref 65–99)

## 2015-08-16 MED ORDER — TAMSULOSIN HCL 0.4 MG PO CAPS
0.8000 mg | ORAL_CAPSULE | Freq: Every day | ORAL | Status: DC
  Administered 2015-08-17 – 2015-08-19 (×3): 0.8 mg via ORAL
  Filled 2015-08-16 (×3): qty 2

## 2015-08-16 NOTE — Evaluation (Signed)
Speech Language Pathology Evaluation Patient Details Name: [name] MRN: [id number] DOB: [birth year] Today's Date: 08/16/2015 Time: [id number] SLP Time Calculation (min) (ACUTE ONLY): 41 min  Problem List:  Patient Active Problem List   Diagnosis Date Noted  . Weakness 08/15/2015  . Cellulitis of right upper arm 06/02/2014  . Blood poisoning ([place])   . Encephalopathy 05/29/2014  . Sepsis ([place]) 05/29/2014  . UTI (lower urinary tract infection) 09/16/2013  . Recurrent falls 09/16/2013  . History of CVA (cerebrovascular accident) 09/16/2013  . Leukocytosis 09/16/2013  . Thrombocytopenia ([place]) 09/16/2013  . 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 Diabetes mellitus ([place]) 10/09/2011  . Hypertension 10/09/2011  . A-fib [facility])    Past Medical History:  Past Medical History  Diagnosis Date  . 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 Hypertension   . Diabetes mellitus   . A-fib ([place])   . Cataract   . Charcot-Marie-Tooth disease   . 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 Stroke [facility])    Past Surgical History: History reviewed. No pertinent past surgical history. HPI:  Patient is a 75 y/o male with hx of CVA, IDDM, recurrent dalls, A-fib, lymphoma s/p resection in remission presents with unsteady gait, falls, and UTI. MRI and head CT-unremarkable. Workup pending.    Assessment / Plan / Recommendation Clinical Impression  Pt has mild difficulties with working memory, complex problem solving, and retrieval of new information, although overall his safety awareness appears intact and he is able to recall key information from hospital stay to relay to his daughter. Pt and daughter both report that the aforementioned impairments are baseline, and have been progressing over the last several years. No acute changes associated with this admission, therefore recommend pt f/u with PCP for concerns about memory changes over time. Pt and daughter in [place],  SLP to sign off.    SLP Assessment  Patient does not need any further Speech Lanaguage Pathology Services    Follow Up Recommendations  24 hour supervision/assistance    Frequency and Duration           SLP Evaluation Prior Functioning  Cognitive/Linguistic Baseline: Baseline deficits Baseline deficit details: pt reports trouble with memory, daughter describes that and personality changes and is concerned about dementia Type of Home: House  Lives With: Alone (spouse is currently at SNF, not sure if she will return) Available Help at Discharge: Family;Available PRN/intermittently (dtr nearby)   Cognition  Overall Cognitive Status: History of cognitive impairments - at baseline    Comprehension  Auditory Comprehension Overall Auditory Comprehension: Appears within functional limits for tasks assessed Reading Comprehension Reading Status: Within funtional limits    Expression Expression Primary Mode of Expression: Verbal Verbal Expression Overall Verbal Expression: Appears within functional limits for tasks assessed   Oral / Motor  Oral Motor/Sensory Function Overall Oral Motor/Sensory Function: Within functional limits Motor Speech Overall Motor Speech: Appears within functional limits for tasks assessed (although dtr reports concern about slurredness)   GO          Functional Assessment Tool Used: skilled clinical judgment Functional Limitations: Memory Memory Current Status [id number]): At least 20 percent but less than 40 percent impaired, limited or restricted Memory Goal Status [id number]): At least 20 percent but less than 40 percent impaired, limited or restricted Memory Discharge Status [id number]): At least 20 percent but less than 40 percent impaired, limited or restricted  Germain Osgood, M.A. CCC-SLP [phone]  Germain Osgood 08/16/2015, 1:36 PM

## 2015-08-16 NOTE — Progress Notes (Signed)
This admission has been reviewed and determined not to meet inpatient level of care. Both attending Physician and Medical Director are in agreement this should be an Observation encounter according to the Medicare Conditions of Participation as set forth in CFR 42 Chapter 456 482.12 (c) and the Medicare Condition Code-44 Regulations CFR 42 Chapter 100 - 04 50.3. The Patient and/or Patient Representative was notified via delivery of the "MEDICARE OBSERVATION STATUS NOTIFICATION".              

## 2015-08-16 NOTE — Evaluation (Signed)
Occupational Therapy Evaluation Patient Details Name: [name] MRN: [id number] DOB: 12/19/1940 Today's Date: 08/16/2015    History of Present Illness Patient is a 75 y/o male with hx of CVA, IDDM, recurrent dalls, A-fib, lymphoma s/p resection in remission presents with unsteady gait, falls, and UTI. MRI and head CT-unremarkable. Workup pending.    Clinical Impression   Pt with hx of multiple falls, but managing at home alone.  Pt presents with generalized weakness and poor balance with baseline impairment in cognition. Pt heavily dependent on UEs for standing balance this visit, precluding ability to perform LB ADL.  Pt with urinary incontinence upon OTs arrival without awareness.  Pt will need 24 hour capable assistance at home and HHOT. Will follow acutely.    Follow Up Recommendations  Home health OT;Supervision/Assistance - 24 hour    Equipment Recommendations       Recommendations for Other Services       Precautions / Restrictions Precautions Precautions: Fall Precaution Comments: hx of multiple falls per daughter Restrictions Weight Bearing Restrictions: No      Mobility Bed Mobility Overal bed mobility: Needs Assistance Bed Mobility: Sit to Supine     Supine to sit: Min guard;HOB elevated Sit to supine: Min guard   General bed mobility comments: sits abruptly nearly on the bedrail, no physical assist, pulled himself up in bed with rails  Transfers Overall transfer level: Needs assistance Equipment used: 1 person hand held assist Transfers: Sit to/from [facility] Sit to Stand: Mod assist Stand pivot transfers: Mod assist       General transfer comment: heavy mod for sit to stand from recliner with UB use and momentum, steadying assist in standing    Balance Overall balance assessment: Needs assistance Sitting-balance support: Feet supported Sitting balance-Leahy Scale: Good     Standing balance support: During functional  activity Standing balance-Leahy Scale: Poor Standing balance comment: Reliant on BUEs for support.                             ADL Overall ADL's : Needs assistance/impaired Eating/Feeding: Independent;Sitting   Grooming: Set up;Sitting   Upper Body Bathing: Set up;Sitting   Lower Body Bathing: Moderate assistance;Sit to/from stand   Upper Body Dressing : Set up;Sitting   Lower Body Dressing: Moderate assistance;Sit to/from stand   Toilet Transfer: Moderate assistance;Stand-pivot   Toileting- Clothing Manipulation and Hygiene: Moderate assistance;Sit to/from stand               Vision     Perception     Praxis      Pertinent Vitals/Pain Pain Assessment: No/denies pain     Hand Dominance Right   Extremity/Trunk Assessment Upper Extremity Assessment Upper Extremity Assessment: Generalized weakness   Lower Extremity Assessment Lower Extremity Assessment: Generalized weakness (B LE numbness) RLE Deficits / Details: Numbness/tingling distal to ankle RLE Sensation: history of peripheral neuropathy RLE Coordination: decreased fine motor LLE Deficits / Details: Numbness/tingling distal to ankle LLE Sensation: history of peripheral neuropathy LLE Coordination: decreased fine motor       Communication Communication Communication: HOH (daughter reports hearing is worse than baseline)   Cognition Arousal/Alertness: Awake/alert Behavior During Therapy: WFL for tasks assessed/performed Overall Cognitive Status: History of cognitive impairments - at baseline       Memory: Decreased short-term memory             General Comments       Exercises  Shoulder Instructions      Home Living Family/patient expects to be discharged to:: Private residence Living Arrangements: Alone (wife in SNF) Available Help at Discharge: Family;Available PRN/intermittently (daughter nearby with poor health) Type of Home: House Home Access: Ramped  entrance;Stairs to enter Entrance Stairs-Number of Steps: 3 Entrance Stairs-Rails:  (handle) Home Layout: One level     Bathroom Shower/Tub:  (pt sponge bathes)   Bathroom Toilet: Standard     Home Equipment: Walker - 2 wheels;Wheelchair - manual;Cane - single point;Cane - quad;Walker - standard;Bedside commode      Lives With: Alone    Prior Functioning/Environment Level of Independence: Independent with assistive device(s)        Comments: Uses SPC for community ambulation; furniture walker for household ambulation. 6 falls in last 6 months. Drives. Does Sponge baths. Eats mostly TV dinners.    OT Diagnosis: Generalized weakness;Cognitive deficits   OT Problem List: Decreased strength;Decreased activity tolerance;Impaired balance (sitting and/or standing);Decreased knowledge of use of DME or AE;Decreased safety awareness;Decreased cognition   OT Treatment/Interventions: Self-care/ADL training;DME and/or AE instruction;Patient/family education;Balance training;Therapeutic activities    OT Goals(Current goals can be found in the care plan section) Acute Rehab OT Goals Patient Stated Goal: to go see my wife at SNF OT Goal Formulation: With patient Time For Goal Achievement: 08/23/15 Potential to Achieve Goals: Fair ADL Goals Pt Will Perform Grooming: with modified independence;standing Pt Will Perform Lower Body Bathing: with modified independence;sit to/from stand Pt Will Perform Lower Body Dressing: with modified independence;sit to/from stand Pt Will Transfer to Toilet: with modified independence;ambulating Pt Will Perform Toileting - Clothing Manipulation and hygiene: with modified independence;sitting/lateral leans Additional ADL Goal #1: Pt will gather items for ADL around room with RW at a modified independent level.  OT Frequency: Min 2X/week   Barriers to D/C: Decreased caregiver support          Co-evaluation              End of Session Equipment  Utilized During Treatment: Gait belt Nurse Communication:  (per daughter, more HOH and seeming weaker than yesterday)  Activity Tolerance: Patient tolerated treatment well Patient left: in bed;with call bell/phone within reach;with family/visitor present (IV team in room)   Time: [id number] OT Time Calculation (min): 30 min Charges:  OT General Charges $OT Visit: 1 Procedure OT Evaluation $OT Eval Moderate Complexity: 1 Procedure OT Treatments $Self Care/Home Management : 8-22 mins G-Codes: OT G-codes **NOT FOR INPATIENT CLASS** Functional Assessment Tool Used: clinical judgement Functional Limitation: Self care Self Care Current Status [id number]): At least 20 percent but less than 40 percent impaired, limited or restricted Self Care Goal Status [id number]): At least 20 percent but less than 40 percent impaired, limited or restricted Self Care Discharge Status [phone]): At least 20 percent but less than 40 percent impaired, limited or restricted  Malka So 08/16/2015, 2:51 PM  [phone]

## 2015-08-16 NOTE — Care Management Obs Status (Signed)
[facility] NOTIFICATION   Patient Details  Name: [name] MRN: [id number] Date of Birth: 09/11/1940   Medicare Observation Status Notification Given:  Yes (CC44 given to pt/daughter)    [name], RN 08/16/2015, 3:56 PM

## 2015-08-16 NOTE — Progress Notes (Signed)
PROGRESS NOTE  [name] [id number] DOB: [birth year] DOA: 08/15/2015 PCP: Donnie Coffin, MD  HPI/Recap of past 24 hours:  Reported worsening of hearing impairment, denies headache, denies new weakness, daughter at bedside  Assessment/Plan: Principal Problem:   Weakness Active Problems:   A-fib ([place])   Diabetes mellitus ([place])   UTI (lower urinary tract infection)   History of CVA (cerebrovascular accident)  Weakness/unsteady gait/actuely worsening of impaired hearing (per daughter was the symptoms of prior cva) - CT head with no acute findings, MRI brain no acute findings, MRA head, 2-D echo, carotid Dopplers, will monitor on telemetry,  aspirin,  Plavix. - f/u neuro recommendations - Physical exam nonspecific, findings may be related to UTI as well.  UTI - Continue with IV Rocephin, follow with urine cultures  Diabetes mellitus - We'll hold oral hypoglycemic agents, will continue Lantus but at a lower dose, will add insulin sliding scale, will check hemoglobin A1c  Hypertension - hold lisinopril  , continue the rest of home bp meds  History of CVA - Continue with Plavix  A. Fib - Currently in sinus rhythm - Not on anticoagulation, giving his history of multiple falls  Hyperlipidemia - Continue with statin  Mild bilateral lower extremity edema: echo with grade 2 diastolic dysfunction, will hold ivf  H/o frequent falls; partly due to peripheral neuropathy, patient has diminished sensation bilateral lower extremity  DVT Prophylaxis Heparin   Code Status: full  Family Communication: patient and daughter  Disposition Plan: need SNF   Consultants:  neurology  Procedures:  none  Antibiotics:  Rocephin from admission   Objective: BP 102/40 mmHg  Pulse 72  Temp(Src) 98.1 F (36.7 C) (Oral)  Resp 18  Ht 5\' 11"  (1.803 m)  Wt 83.3 kg (183 lb 10.3 oz)  BMI 25.62 kg/m2  SpO2 96%  Intake/Output Summary (Last 24 hours) at 08/16/15 1703 Last  data filed at 08/16/15 1426  Gross per 24 hour  Intake    240 ml  Output      0 ml  Net    240 ml   Filed Weights   08/15/15 1944  Weight: 83.3 kg (183 lb 10.3 oz)    Exam:   General:  NAD  Cardiovascular: RRR  Respiratory: CTABL  Abdomen: Soft/ND/NT, positive BS  Musculoskeletal: chronic venous stasis, mild bilateral ankle pitting edema, superficial wound to left lower leg dressing intact,  Neuro: aaox3, hard of hearing, no focal deficit  Data Reviewed: Basic Metabolic Panel:  Recent Labs Lab 08/15/15 0854 08/15/15 0903  NA 139 139  K 4.4 4.3  CL 103 99*  CO2 26  --   GLUCOSE 107* 101*  BUN 17 20  CREATININE 0.71 0.70  CALCIUM 8.8*  --    Liver Function Tests:  Recent Labs Lab 08/15/15 0854  AST 20  ALT 24  ALKPHOS 59  BILITOT 0.4  PROT 6.5  ALBUMIN 3.3*   No results for input(s): LIPASE, AMYLASE in the last 168 hours. No results for input(s): AMMONIA in the last 168 hours. CBC:  Recent Labs Lab 08/15/15 0854 08/15/15 0903  WBC 13.7*  --   NEUTROABS 11.6*  --   HGB 15.5 17.0  HCT 46.6 50.0  MCV 89.3  --   PLT 146*  --    Cardiac Enzymes:   No results for input(s): CKTOTAL, CKMB, CKMBINDEX, TROPONINI in the last 168 hours. BNP (last 3 results) No results for input(s): BNP in the last 8760 hours.  ProBNP (last  3 results) No results for input(s): PROBNP in the last 8760 hours.  CBG:  Recent Labs Lab 08/15/15 1550 08/15/15 2107 08/16/15 0604 08/16/15 1118 08/16/15 1629  GLUCAP 87 162* 123* 88 312*    Recent Results (from the past 240 hour(s))  Urine culture     Status: None (Preliminary result)   Collection Time: 08/15/15 10:45 AM  Result Value Ref Range Status   Specimen Description URINE, CLEAN CATCH  Final   Special Requests NONE  Final   Culture >=100,000 COLONIES/mL ESCHERICHIA COLI  Final   Report Status PENDING  Incomplete     Studies: No results found.  Scheduled Meds: .  stroke: mapping our early stages of  recovery book   Does not apply Once  . amLODipine  5 mg Oral Daily  . cefTRIAXone (ROCEPHIN)  IV  1 g Intravenous Q24H  . clopidogrel  75 mg Oral Daily  . heparin  5,000 Units Subcutaneous 3 times per day  . insulin aspart  0-9 Units Subcutaneous TID WC  . insulin glargine  15 Units Subcutaneous Daily  . metoprolol tartrate  25 mg Oral BID  . saccharomyces boulardii  250 mg Oral BID  . simvastatin  20 mg Oral QHS  . [START ON 08/17/2015] tamsulosin  0.8 mg Oral Daily    Continuous Infusions:    Time spent: 62mins  Nayleen Janosik MD, PhD  [facility] Pager [phone]. If 7PM-7AM, please contact night-coverage at [email or web address], password [facility] 08/16/2015, 5:03 PM  LOS: 1 day

## 2015-08-16 NOTE — Evaluation (Signed)
Physical Therapy Evaluation Patient Details Name: [name] MRN: [id number] DOB: 1941/03/14 Today's Date: 08/16/2015   History of Present Illness  Patient is a 75 y/o male with hx of CVA, IDDM, recurrent dalls, A-fib, lymphoma s/p resection in remission presents with unsteady gait, falls, and UTI. MRI and head CT-unremarkable. Workup pending.   Clinical Impression  Patient presents with generalized weakness RLE>LLE and impaired balance impacting safe mobility. Pt high fall risk. Requires use of RW and Min A for support. Pt is from home alone and Mod I PTA. Reports falls at home. Daughter lives close by. May need to see if daughter can provide initial 24/7 S for the first week for safety. Will follow acutely to maximize independence and mobility prior to return home.    Follow Up Recommendations Home health PT;Supervision for mobility/OOB    Equipment Recommendations  None recommended by PT    Recommendations for Other Services OT consult     Precautions / Restrictions Precautions Precautions: Fall Restrictions Weight Bearing Restrictions: No      Mobility  Bed Mobility Overal bed mobility: Needs Assistance Bed Mobility: Supine to Sit     Supine to sit: Min guard;HOB elevated     General bed mobility comments: Heavy use of rail; increased effort/time but able to get to EOB without assist.   Transfers Overall transfer level: Needs assistance Equipment used: Rolling walker (2 wheeled) Transfers: Sit to/from Stand Sit to Stand: Min assist         General transfer comment: Min A to boost from EOB with cues for hand placement/technique. Assist to steady in standing.  Ambulation/Gait Ambulation/Gait assistance: Min assist Ambulation Distance (Feet): 150 Feet Assistive device: Rolling walker (2 wheeled) Gait Pattern/deviations: Step-through pattern;Decreased stride length;Decreased dorsiflexion - right;Trunk flexed Gait velocity: decreased Gait velocity  interpretation: <1.8 ft/sec, indicative of risk for recurrent falls General Gait Details: Mildly ataxic like gait pattern. Decreased foot clearance RLE esp towards end of ambulation when fatigued. 1 almost LOB requiring assist to correct.   Stairs            Wheelchair Mobility    Modified Rankin (Stroke Patients Only) Modified Rankin (Stroke Patients Only) Pre-Morbid Rankin Score: Slight disability Modified Rankin: Moderately severe disability     Balance Overall balance assessment: Needs assistance;History of Falls Sitting-balance support: Feet supported;No upper extremity supported Sitting balance-Leahy Scale: Good     Standing balance support: During functional activity Standing balance-Leahy Scale: Poor Standing balance comment: Reliant on BUEs for support.                              Pertinent Vitals/Pain Pain Assessment: No/denies pain    Home Living Family/patient expects to be discharged to:: Private residence Living Arrangements: Alone (Wife in [facility]) Available Help at Discharge: Family;Available PRN/intermittently (daughter lives 5 mins away) Type of Home: House Home Access: Ramped entrance;Stairs to enter Entrance Stairs-Rails:  (handle) Entrance Stairs-Number of Steps: 3 Home Layout: One level Home Equipment: Walker - 2 wheels;Wheelchair - manual;Cane - single point;Cane - quad;Walker - standard;Bedside commode      Prior Function Level of Independence: Independent with assistive device(s)         Comments: Uses SPC for community ambulation; furniture walker for household ambulation. 6 falls in last 6 months. Drives. Does Sponge baths. Eats mostly TV dinners.     Hand Dominance        Extremity/Trunk Assessment   Upper Extremity  Assessment: Defer to OT evaluation           Lower Extremity Assessment: RLE deficits/detail;LLE deficits/detail RLE Deficits / Details: Numbness/tingling distal to ankle LLE Deficits /  Details: Numbness/tingling distal to ankle     Communication   Communication: No difficulties  Cognition Arousal/Alertness: Awake/alert Behavior During Therapy: WFL for tasks assessed/performed Overall Cognitive Status: Within Functional Limits for tasks assessed                      General Comments General comments (skin integrity, edema, etc.): Pt emotional when talking about missing his wife. Wife is at [facility].    Exercises        Assessment/Plan    PT Assessment Patient needs continued PT services  PT Diagnosis Generalized weakness;Difficulty walking   PT Problem List Decreased strength;Decreased coordination;Impaired sensation;Decreased balance;Decreased mobility;Decreased activity tolerance;Decreased safety awareness  PT Treatment Interventions Balance training;Gait training;Functional mobility training;Therapeutic activities;Therapeutic exercise;Patient/family education;Stair training;Neuromuscular re-education   PT Goals (Current goals can be found in the Care Plan section) Acute Rehab PT Goals Patient Stated Goal: to go see my wife at SNF PT Goal Formulation: With patient Time For Goal Achievement: 08/30/15 Potential to Achieve Goals: Good    Frequency Min 3X/week   Barriers to discharge Decreased caregiver support;Inaccessible home environment Lives alone; steps to get into home    Co-evaluation               End of Session Equipment Utilized During Treatment: Gait belt Activity Tolerance: Patient tolerated treatment well Patient left: in chair;with call bell/phone within reach;with chair alarm set Nurse Communication: Mobility status    Functional Assessment Tool Used: Clinical judgment Functional Limitation: Mobility: Walking and moving around Mobility: Walking and Moving Around Current Status [id number]): At least 20 percent but less than 40 percent impaired, limited or restricted Mobility: Walking and Moving Around Goal Status [phone]): At least 1  percent but less than 20 percent impaired, limited or restricted    Time: 1117-1140 PT Time Calculation (min) (ACUTE ONLY): 23 min   Charges:   PT Evaluation $PT Eval Moderate Complexity: 1 Procedure PT Treatments $Gait Training: 8-22 mins   PT G Codes:   PT G-Codes **NOT FOR INPATIENT CLASS** Functional Assessment Tool Used: Clinical judgment Functional Limitation: Mobility: Walking and moving around Mobility: Walking and Moving Around Current Status [id number]): At least 20 percent but less than 40 percent impaired, limited or restricted Mobility: Walking and Moving Around Goal Status [phone]): At least 1 percent but less than 20 percent impaired, limited or restricted    Malikhi Ogan A Shanna Strength 08/16/2015, 12:03 PM Wray Kearns, [place], DPT [phone]

## 2015-08-16 NOTE — Care Management Important Message (Signed)
Important Message  Patient Details  Name: [name] MRN: [id number] Date of Birth: 08/24/1940   Medicare Important Message Given:  Yes    Nathen May 08/16/2015, 2:51 PM

## 2015-08-16 NOTE — Progress Notes (Signed)
VASCULAR LAB PRELIMINARY  PRELIMINARY  PRELIMINARY  PRELIMINARY  Carotid duplex has been completed.     Bilateral:  1-39% ICA stenosis.  Vertebral artery flow is antegrade.     Delena Casebeer, RVT, RDMS 08/16/2015, 10:59 AM

## 2015-08-16 NOTE — Care Management Note (Addendum)
Case Management Note Marvetta Gibbons RN, BSN Unit 2W-Case Manager [phone]  Patient Details  Name: [name] MRN: [id number] Date of Birth: [birth year]  Subjective/Objective:   Pt admitted with weakness                 Action/Plan: PTA pt lived at home alone- per conversation with pt and daughter pt has not wanted daughter to help him at home wife is in a nursing home- currently pt under observation status and does not qualify for a rehab stay under Medicare guidelines- per daughter pt could not afford a rehab stay out of pocket- pt and daughter understand that home with [facility] would not mean someone would be there 24/7- unless daughter provided this arranged out of pocket. Spoke with CSW and [employer] does approve STSNF in some cases without a 3 night qualifying stay- CSW to f/u on potential STSNF placement with pt and daughter as pt at this time would be unsafe at home. CM to continue to follow.   Expected Discharge Date:                  Expected Discharge Plan:  Home/Self Care  In-House Referral:     Discharge planning Services  CM Consult  Post Acute Care Choice:    Choice offered to:     DME Arranged:    DME Agency:     HH Arranged:    HH Agency:     Status of Service:  In process, will continue to follow  Medicare Important Message Given:  Yes Date Medicare IM Given:    Medicare IM give by:    Date Additional Medicare IM Given:    Additional Medicare Important Message give by:     If discussed at [place] [facility] [place] Meetings, dates discussed:    Additional Comments:  [name], RN 08/16/2015, 3:58 PM

## 2015-08-16 NOTE — Clinical Social Work Note (Signed)
Clinical Social Work Assessment  Patient Details  Name: [name] MRN: [id number] Date of Birth: 1940/08/07  Date of referral:  08/16/15               Reason for consult:  Facility Placement                Permission sought to share information with:  Facility Sport and exercise psychologist, Family Supports Permission granted to share information::  Yes, Verbal Permission Granted  Name::     Mardene Celeste  Agency::  [facility] SNFs  Relationship::  Daughter  Contact Information:  [phone]  Housing/Transportation Living arrangements for the past 2 months:  [place] of Information:  Patient Patient Interpreter Needed:  None Criminal Activity/Legal Involvement Pertinent to Current Situation/Hospitalization:  No - Comment as needed Significant Relationships:  Adult Children, Spouse Lives with:  Self Do you feel safe going back to the place where you live?  No Need for family participation in patient care:  Yes (Comment)  Care giving concerns:  CSW received referral for possible SNF placement at time of discharge. CSW met with patient and patient's daughter at bedside regarding PT recommendation of SNF placement at time of discharge. Patient is hard of hearing. Patient's daughter is worried that he is about to have a stroke. Per patient's daughter, patient is currently unable to care for patient at home given patient's current physical needs and fall risk. Patient and patient's daughter expressed understanding of PT recommendation and are agreeable to SNF placement at time of discharge. CSW to continue to follow and assist with discharge planning needs.   Social Worker assessment / plan:  CSW spoke with patient and patient's daughter concerning possibility of rehab at [facility] before returning home.  Employment status:  Retired Nurse, adult PT Recommendations:  [place] / Referral to community resources:  [place]  Patient/Family's Response to care:  Patient and patient's daughter recognize need for rehab before returning home and are agreeable to a SNF in [place]. Patient reported preference for [facility] or [facility] [address]  Patient/Family's Understanding of and Emotional Response to Diagnosis, Current Treatment, and Prognosis:  Patient is realistic regarding therapy needs. No questions/concerns about plan or treatment.    Emotional Assessment Appearance:  Appears stated age Attitude/Demeanor/Rapport:   (Appropriate) Affect (typically observed):  Accepting Orientation:  Oriented to Self, Oriented to Place, Oriented to  Time, Oriented to Situation Alcohol / Substance use:    Psych involvement (Current and /or in the community):  No (Comment)  Discharge Needs  Concerns to be addressed:  Care Coordination Readmission within the last 30 days:  No Current discharge risk:  None Barriers to Discharge:  Continued Medical Work up   [employer], [place] 08/16/2015, 5:02 PM

## 2015-08-16 NOTE — Progress Notes (Signed)
Echocardiogram 2D Echocardiogram has been performed.  Tresa Res 08/16/2015, 11:27 AM

## 2015-08-16 NOTE — NC FL2 (Signed)
[place] LEVEL OF CARE SCREENING TOOL     IDENTIFICATION  Patient Name: [name] Birthdate: Oct 16, 1940 Sex: male Admission Date (Current Location): 08/15/2015  [facility] and [place] Number:  Herbalist and Address:  [facility] [place]. [facility], [place] [address] [place], [place] [address]      Provider Number: [id number]  Attending Physician Name and Address:  Florencia Reasons, MD  Relative Name and Phone Number:  Mardene Celeste, daughter, [phone]    Current Level of Care: Hospital Recommended Level of Care: [place] Prior Approval Number:    Date Approved/Denied:   PASRR Number: [id number]  Discharge Plan: SNF    Current Diagnoses: Patient Active Problem List   Diagnosis Date Noted  . Weakness 08/15/2015  . Cellulitis of right upper arm 06/02/2014  . Blood poisoning ([place])   . Encephalopathy 05/29/2014  . Sepsis ([place]) 05/29/2014  . UTI (lower urinary tract infection) 09/16/2013  . Recurrent falls 09/16/2013  . History of CVA (cerebrovascular accident) 09/16/2013  . Leukocytosis 09/16/2013  . Thrombocytopenia ([place]) 09/16/2013  . 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 Diabetes mellitus ([place]) 10/09/2011  . Hypertension 10/09/2011  . A-fib (HCC)     Orientation RESPIRATION BLADDER Height & Weight     Self, Time, Situation, Place  Normal Incontinent Weight: 183 lb 10.3 oz (83.3 kg) Height:  5\' 11"  (180.3 cm)  BEHAVIORAL SYMPTOMS/MOOD NEUROLOGICAL BOWEL NUTRITION STATUS   (N/A)   Continent  (Please see DC summary)  AMBULATORY STATUS COMMUNICATION OF NEEDS Skin   Extensive Assist Verbally Normal                       Personal Care Assistance Level of Assistance  Bathing, Feeding, Dressing Bathing Assistance: Limited assistance Feeding assistance: Limited assistance Dressing Assistance: Limited assistance     Functional Limitations Info             SPECIAL CARE FACTORS FREQUENCY  PT (By licensed PT), OT (By  licensed OT)     PT Frequency: min 3x/week OT Frequency: min 2x/week            Contractures      Additional Factors Info  Code Status, Allergies, Insulin Sliding Scale Code Status Info: Full Allergies Info: Actos, Hydrochlorothiazide, Penicillins   Insulin Sliding Scale Info: insulin aspart (novoLOG) injection 0-9 Units; insulin glargine (LANTUS) injection 15 Units       Current Medications (08/16/2015):  This is the current hospital active medication list Current Facility-Administered Medications  Medication Dose Route Frequency Provider Last Rate Last Dose  .  stroke: mapping our early stages of recovery book   Does not apply Once Albertine Patricia, MD      . 0.9 %  sodium chloride infusion   Intravenous Continuous Albertine Patricia, MD 50 mL/hr at 08/15/15 2302    . acetaminophen (TYLENOL) tablet 650 mg  650 mg Oral Q6H PRN Silver Huguenin Elgergawy, MD      . amLODipine (NORVASC) tablet 5 mg  5 mg Oral Daily Albertine Patricia, MD   5 mg at 08/16/15 0950  . cefTRIAXone (ROCEPHIN) 1 g in dextrose 5 % 50 mL IVPB  1 g Intravenous Q24H Albertine Patricia, MD   1 g at 08/16/15 1237  . clopidogrel (PLAVIX) tablet 75 mg  75 mg Oral Daily Albertine Patricia, MD   75 mg at 08/16/15 0950  . heparin injection 5,000 Units  5,000 Units Subcutaneous 3  times per day Albertine Patricia, MD   5,000 Units at 08/16/15 1444  . insulin aspart (novoLOG) injection 0-9 Units  0-9 Units Subcutaneous TID WC Albertine Patricia, MD   1 Units at 08/16/15 0810  . insulin glargine (LANTUS) injection 15 Units  15 Units Subcutaneous Daily Albertine Patricia, MD   Stopped at 08/16/15 [phone]  . lisinopril (PRINIVIL,ZESTRIL) tablet 20 mg  20 mg Oral BH-q7a Albertine Patricia, MD   20 mg at 08/16/15 0620  . loperamide (IMODIUM) capsule 2 mg  2 mg Oral Daily PRN Albertine Patricia, MD      . metoprolol tartrate (LOPRESSOR) tablet 25 mg  25 mg Oral BID Albertine Patricia, MD   25 mg at 08/16/15 0950  . saccharomyces  boulardii (FLORASTOR) capsule 250 mg  250 mg Oral BID Albertine Patricia, MD   250 mg at 08/16/15 0950  . senna-docusate (Senokot-S) tablet 1 tablet  1 tablet Oral QHS PRN Albertine Patricia, MD      . simvastatin (ZOCOR) tablet 20 mg  20 mg Oral QHS Albertine Patricia, MD   20 mg at 08/15/15 2312  . tamsulosin (FLOMAX) capsule 0.4 mg  0.4 mg Oral Daily Albertine Patricia, MD   0.4 mg at 08/16/15 [id number]     Discharge Medications: Please see discharge summary for a list of discharge medications.  Relevant Imaging Results:  Relevant Lab Results:   Additional Information SSN: [place], [place]

## 2015-08-16 NOTE — Progress Notes (Signed)
STROKE TEAM PROGRESS NOTE   HISTORY OF PRESENT ILLNESS [name] is an 75 y.o. male with history of previous strokes in the past. He does take ASA but not every day. He is also on Plavix. This AM he noted he could not stay standing up right. He mentioned he would fall back against the wall and then into a chair. Family noted he was leaning to the right and tended to fall to the right. Patient felt as if both his lags felt weak. He had no vertigo, dizziness, diplopia, weakness unilaterally or clumsiness. Currently in the bed he feels fine but when sitting up he was notably leaning to the right. He has history of Afib but in NSR.   Date last known well: yesterday Time last known well: Time: 22:00 tPA Given: No: Out of time window   SUBJECTIVE (INTERVAL HISTORY) Patient feels better. Right leg weakness from previous CVA. No family at bedside.    OBJECTIVE Temp:  [98.6 F (37 C)-102 F (38.9 C)] 99.6 F (37.6 C) (02/24 0500) Pulse Rate:  [75-96] 75 (02/24 0500) Cardiac Rhythm:  [-] Normal sinus rhythm;Bundle branch block (02/23 2010) Resp:  [14-26] 22 (02/24 0500) BP: (110-167)/(43-83) 110/54 mmHg (02/24 0500) SpO2:  [95 %-100 %] 98 % (02/24 0500) Weight:  [83.3 kg (183 lb 10.3 oz)] 83.3 kg (183 lb 10.3 oz) (02/23 1944)  CBC:  Recent Labs Lab 08/15/15 0854 08/15/15 0903  WBC 13.7*  --   NEUTROABS 11.6*  --   HGB 15.5 17.0  HCT 46.6 50.0  MCV 89.3  --   PLT 146*  --     Basic Metabolic Panel:  Recent Labs Lab 08/15/15 0854 08/15/15 0903  NA 139 139  K 4.4 4.3  CL 103 99*  CO2 26  --   GLUCOSE 107* 101*  BUN 17 20  CREATININE 0.71 0.70  CALCIUM 8.8*  --     Lipid Panel:    Component Value Date/Time   CHOL 95 08/16/2015 0240   TRIG 67 08/16/2015 0240   HDL 24* 08/16/2015 0240   CHOLHDL 4.0 08/16/2015 0240   VLDL 13 08/16/2015 0240   LDLCALC 58 08/16/2015 0240   HgbA1c:  Lab Results  Component Value Date   HGBA1C 7.7* 05/30/2014   Urine Drug Screen:     Component Value Date/Time   LABOPIA NONE DETECTED 08/15/2015 0908   COCAINSCRNUR NONE DETECTED 08/15/2015 0908   LABBENZ NONE DETECTED 08/15/2015 0908   AMPHETMU NONE DETECTED 08/15/2015 0908   THCU NONE DETECTED 08/15/2015 0908   LABBARB NONE DETECTED 08/15/2015 0908      IMAGING  Ct Head Wo Contrast 08/15/2015   1. No acute finding or definitive change since 2015.  2. Cerebellar predominant atrophy.  3. 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   Mr [name] Head/brain Wo Cm 08/15/2015   1. No acute intracranial abnormality.  2. Moderate chronic small vessel ischemic disease and cerebral atrophy with chronic infarcts as above.  3. Minimal flow related enhancement in the distal vertebral arteries and basilar artery, similar to the prior MRA. Severe bilateral vertebral artery stenoses demonstrated on prior catheter angiogram.  4. Mild bilateral ICA, mild right M1, and moderate right M2 stenoses without significant change.      PHYSICAL EXAM   Physical exam: Exam: Gen: NAD Eyes: anicteric sclerae, moist conjunctivae  CV: no MRG, no carotid bruits, no peripheral edema Mental Status: Alert, follows commands, good historian. Oriented to name, date, location.   Neuro: Detailed Neurologic Exam  Speech:    No aphasia, no dysarthria  Cranial Nerves:    The pupils are equal, round, and reactive to light..  EOMI. No gaze preference. Visual fields full. Left eye ptosis otherwise face symmetric, facial sensation intact and intact muscles of mastication, Tongue midline. Hearing intact to voice. Shoulder shrug intact  Motor Observation:    no involuntary movements noted. Tone appears normal.     Strength:    Right lower extremity weakness 3+/5 with drift, Otherwise strength appears intact.      Sensation:  Symmetric to LT and pinprick  Plantars downgoing.   Coordination: Intact FTN and HTS. Mild right leg dysmetria but not  out of proportion to weakness.    PHYSICAL EXAMMr. [name] is a 75 y.o. male with history of atrial fibrillation, hypertension, diabetes mellitus, [place] tooth disease, B-cell lymphoma, as previous strokes, and known vertebral artery disease presenting with weakness and inability to stand. He did not receive IV t-PA due to late presentation.  Possible TIA:  Posterior circulation  Resultant  Resolving deficits  MRI  No acute intracranial abnormality.   MRA  Severe bilateral vertebral artery stenoses demonstrated on prior catheter angiogram.   Carotid Doppler - pending  2D Echo pending  LDL - 58  HgbA1c pending  VTE prophylaxis - subcutaneous heparin  Diet NPO time specified  aspirin 325 mg daily and clopidogrel 75 mg daily prior to admission, now on aspirin 300 mg suppository daily   Plavix ordered for when swallowing has been cleared.  Patient counseled to be compliant with his antithrombotic medications  Ongoing aggressive stroke risk factor management  Therapy recommendations: Pending  Disposition: Pending  Hypertension  Blood pressure mildly low but no infarct.  The patient would benefit from higher blood pressures with known vertebral artery disease. (SBP>150)   Hyperlipidemia  Home meds:  Simvastatin 20 mg daily resumed in hospital  LDL 58, goal < 70  Continue statin at discharge  Diabetes  HgbA1c pending, goal < 7.0  Controlled  Other Stroke Risk Factors  Advanced age  Cigarette smoker, quit smoking in 1992  Hx stroke/TIA  Atrial fibrillation  Severe bilateral vertebral artery stenosis   Other Active Problems    Hospital day # [place] PA-C [facility] Pager [phone] 08/16/2015, 10:05 AM   Personally examined patient and images, and have participated in and made any corrections needed to history, physical, neuro exam,assessment and plan as stated above.  I have personally obtained the  history, evaluated lab date, reviewed imaging studies and agree with radiology interpretations. Patient with acute onset weakness and ataxia. MRI of the brain did not show any acute abnormality. Patient's symptoms are resolving. Workup in progress.   Sarina Ill, MD Stroke Neurology [phone] [facility]     To contact Stroke Continuity provider, please refer to [email or web address] After hours, contact General Neurology

## 2015-08-17 DIAGNOSIS — Z8673 Personal history of transient ischemic attack (TIA), and cerebral infarction without residual deficits: Secondary | ICD-10-CM | POA: Diagnosis not present

## 2015-08-17 DIAGNOSIS — I482 Chronic atrial fibrillation: Secondary | ICD-10-CM | POA: Diagnosis not present

## 2015-08-17 DIAGNOSIS — I480 Paroxysmal atrial fibrillation: Secondary | ICD-10-CM | POA: Diagnosis not present

## 2015-08-17 DIAGNOSIS — R296 Repeated falls: Secondary | ICD-10-CM | POA: Diagnosis not present

## 2015-08-17 DIAGNOSIS — R531 Weakness: Secondary | ICD-10-CM | POA: Diagnosis not present

## 2015-08-17 DIAGNOSIS — I951 Orthostatic hypotension: Secondary | ICD-10-CM | POA: Diagnosis not present

## 2015-08-17 LAB — BASIC METABOLIC PANEL
Anion gap: 9 (ref 5–15)
BUN: 30 mg/dL — AB (ref 6–20)
CALCIUM: 8.1 mg/dL — AB (ref 8.9–10.3)
CO2: 26 mmol/L (ref 22–32)
CREATININE: 0.95 mg/dL (ref 0.61–1.24)
Chloride: 104 mmol/L (ref 101–111)
Glucose, Bld: 174 mg/dL — ABNORMAL HIGH (ref 65–99)
Potassium: 4 mmol/L (ref 3.5–5.1)
SODIUM: 139 mmol/L (ref 135–145)

## 2015-08-17 LAB — HEMOGLOBIN A1C
Hgb A1c MFr Bld: 7.5 % — ABNORMAL HIGH (ref 4.8–5.6)
Mean Plasma Glucose: 169 mg/dL

## 2015-08-17 LAB — CBC
HCT: 41.7 % (ref 39.0–52.0)
HEMOGLOBIN: 14 g/dL (ref 13.0–17.0)
MCH: 29.8 pg (ref 26.0–34.0)
MCHC: 33.6 g/dL (ref 30.0–36.0)
MCV: 88.7 fL (ref 78.0–100.0)
Platelets: 122 10*3/uL — ABNORMAL LOW (ref 150–400)
RBC: 4.7 MIL/uL (ref 4.22–5.81)
RDW: 13.3 % (ref 11.5–15.5)
WBC: 6.8 10*3/uL (ref 4.0–10.5)

## 2015-08-17 LAB — MAGNESIUM: MAGNESIUM: 1.9 mg/dL (ref 1.7–2.4)

## 2015-08-17 LAB — TSH: TSH: 2.186 u[IU]/mL (ref 0.350–4.500)

## 2015-08-17 LAB — GLUCOSE, CAPILLARY
GLUCOSE-CAPILLARY: 163 mg/dL — AB (ref 65–99)
GLUCOSE-CAPILLARY: 182 mg/dL — AB (ref 65–99)
GLUCOSE-CAPILLARY: 277 mg/dL — AB (ref 65–99)
Glucose-Capillary: 163 mg/dL — ABNORMAL HIGH (ref 65–99)

## 2015-08-17 MED ORDER — METOPROLOL TARTRATE 12.5 MG HALF TABLET
12.5000 mg | ORAL_TABLET | Freq: Two times a day (BID) | ORAL | Status: DC
  Administered 2015-08-17 – 2015-08-19 (×4): 12.5 mg via ORAL
  Filled 2015-08-17 (×4): qty 1

## 2015-08-17 MED ORDER — ASPIRIN EC 81 MG PO TBEC
81.0000 mg | DELAYED_RELEASE_TABLET | Freq: Every day | ORAL | Status: DC
  Administered 2015-08-17 – 2015-08-19 (×3): 81 mg via ORAL
  Filled 2015-08-17 (×3): qty 1

## 2015-08-17 NOTE — Progress Notes (Signed)
Physical Therapy Treatment Patient Details Name: [name] MRN: [id number] DOB: [birth year] Today's Date: 08/17/2015    History of Present Illness Patient is a 75 y/o male with hx of CVA, IDDM, recurrent dalls, A-fib, lymphoma s/p resection in remission presents with unsteady gait, falls, and UTI. MRI and head CT-unremarkable. Workup pending.     PT Comments    Patient seen for mobility progression. Patient ambulated in hall but continues to required assist for instability. Spoke with patient regarding concerns for safety upon discharge. Patient states that he does not have supervision for mobility and that he would need to be independent at home. At this time, feel patient would benefit from [place] SNF for rehabilitation to reach independence. Patient also with history of several falls in past 6 months.   Follow Up Recommendations  SNF;Supervision for mobility/OOB     Equipment Recommendations  None recommended by PT    Recommendations for Other Services OT consult     Precautions / Restrictions Precautions Precautions: Fall Precaution Comments: hx of multiple falls per daughter Restrictions Weight Bearing Restrictions: No    Mobility  Bed Mobility Overal bed mobility: Needs Assistance Bed Mobility: Sit to Supine       Sit to supine: Min guard   General bed mobility comments: sits abruptly nearly on the bedrail, no physical assist, pulled himself up in bed with rails  Transfers Overall transfer level: Needs assistance Equipment used: 1 person hand held assist Transfers: Sit to/from Stand Sit to Stand: Min assist         General transfer comment: min assist to stand from chair, posterior LOB noted initially  Ambulation/Gait Ambulation/Gait assistance: Min guard;Min assist Ambulation Distance (Feet): 140 Feet (140 ft with RW, additional 60 ft without device ) Assistive device: Rolling walker (2 wheeled);None   Gait velocity: decreased   General Gait  Details: Some instability noted with use of RW, min guard for safety, difficulty navigating around obstacles in room. Ambulated in hall with cues for posture and positioning. Ambulated 60 without device, increased instability noted and decreased cadence despite cues. Min assist provided.   Stairs            Wheelchair Mobility    Modified Rankin (Stroke Patients Only)       Balance     Sitting balance-Leahy Scale: Good     Standing balance support: During functional activity Standing balance-Leahy Scale: Poor Standing balance comment: Continues to required UE support                    Cognition Arousal/Alertness: Awake/alert Behavior During Therapy: WFL for tasks assessed/performed Overall Cognitive Status: History of cognitive impairments - at baseline       Memory: Decreased short-term memory              Exercises      General Comments        Pertinent Vitals/Pain Pain Assessment: No/denies pain    Home Living                      Prior Function            PT Goals (current goals can now be found in the care plan section) Acute Rehab PT Goals Patient Stated Goal: to go see my wife at SNF PT Goal Formulation: With patient Time For Goal Achievement: 08/30/15 Potential to Achieve Goals: Good Progress towards PT goals: Progressing toward goals    Frequency  Min 3X/week  PT Plan Discharge plan needs to be updated    Co-evaluation             End of Session Equipment Utilized During Treatment: Gait belt Activity Tolerance: Patient tolerated treatment well Patient left: in chair;with call bell/phone within reach;with chair alarm set     Time: [id number] PT Time Calculation (min) (ACUTE ONLY): 18 min  Charges:  $Gait Training: 8-22 mins                    G CodesDuncan Dull 2015-08-27, 10:43 AM Alben Deeds, PT DPT  [phone]

## 2015-08-17 NOTE — Clinical Social Work Note (Signed)
Patient and family has chosen bed at [facility]. Facility aware. Pt's dtr, [name] requesting pt's wife to be transferred from [facility] to [facility]. CSW encouraged patient's dtr to coordinate care/transfer with facilities.   Pt's dtr tearful and stated she is overwhelmed.   CSW remains available as needed.   Glendon Axe, MSW, LCSWA [phone] 08/17/2015 10:58 AM

## 2015-08-17 NOTE — Progress Notes (Signed)
Pt BP 86/49 asymomatic. Dr. Erlinda Hong notified. Pt in chair. Will continue to monitor. Vicente Males Therapist, sports

## 2015-08-17 NOTE — Progress Notes (Signed)
PROGRESS NOTE  [name] [id number] DOB: 03-27-1941 DOA: 08/15/2015 PCP: Donnie Coffin, MD  HPI/Recap of past 24 hours:  Reported feeling better, denies headache, denies new weakness, brother at bedside  Assessment/Plan: Principal Problem:   Weakness Active Problems:   A-fib ([place])   Diabetes mellitus ([place])   UTI (lower urinary tract infection)   History of CVA (cerebrovascular accident)   Insulin dependent diabetes mellitus ([place])   Falls frequently  Weakness/unsteady gait/actuely worsening of impaired hearing (per daughter was the symptoms of prior cva) -though does has generalized weakness, on focal weakness,  - CT head with no acute findings, MRI brain no acute findings, MRA head with Severe bilateral vertebral artery stenoses demonstrated on prior catheter angiogram , 2-D echo no cardiac emboli, LVEF wnl, telemetry with sinus rhythm, few pac's,  on  aspirin,  Plavix, statin - f/u neuro recommendations, per neuro target sbp >150 due to significant vertebral artery sternoses   UTI - Continue with IV Rocephin, follow with urine cultures  Diabetes mellitus -  hold oral hypoglycemic agents, continue Lantus but at a lower dose, add insulin sliding scale,  hemoglobin A1c 7.5  Hypertension - hold lisinopril  , continue the rest of home bp meds 2/25 bp low, d/c norvasc, decrease lopressor, check orthostatic vital signs, Per neurology recommendation: Goal of sbp >150 if possible, due to  significant vertebral artery sternoses   History of CVA - Continue with asa, Plavix  A. Fib - Currently in sinus rhythm with a few pac's - Not on anticoagulation, giving his history of multiple falls  Hyperlipidemia - Continue with statin, ldl 58  Mild bilateral lower extremity edema: echo with grade 2 diastolic dysfunction, will hold ivf  H/o frequent falls; partly due to peripheral neuropathy, patient has diminished sensation bilateral lower extremity  DVT Prophylaxis  Heparin   Code Status: full  Family Communication: patient and his brother in room  Disposition Plan: need SNF   Consultants:  neurology  Procedures:  none  Antibiotics:  Rocephin from admission   Objective: BP 86/49 mmHg  Pulse 69  Temp(Src) 98.1 F (36.7 C) (Oral)  Resp 18  Ht 5\' 11"  (1.803 m)  Wt 83.3 kg (183 lb 10.3 oz)  BMI 25.62 kg/m2  SpO2 98%  Intake/Output Summary (Last 24 hours) at 08/17/15 1118 Last data filed at 08/17/15 1117  Gross per 24 hour  Intake    960 ml  Output    400 ml  Net    560 ml   Filed Weights   08/15/15 1944  Weight: 83.3 kg (183 lb 10.3 oz)    Exam:   General:  NAD  Cardiovascular: RRR  Respiratory: CTABL  Abdomen: Soft/ND/NT, positive BS  Musculoskeletal: chronic venous stasis, mild bilateral ankle pitting edema, superficial wound to left lower leg dressing intact,  Neuro: aaox3, hard of hearing, no focal deficit  Data Reviewed: Basic Metabolic Panel:  Recent Labs Lab 08/15/15 0854 08/15/15 0903 08/17/15 0321  NA 139 139 139  K 4.4 4.3 4.0  CL 103 99* 104  CO2 26  --  26  GLUCOSE 107* 101* 174*  BUN 17 20 30*  CREATININE 0.71 0.70 0.95  CALCIUM 8.8*  --  8.1*  MG  --   --  1.9   Liver Function Tests:  Recent Labs Lab 08/15/15 0854  AST 20  ALT 24  ALKPHOS 59  BILITOT 0.4  PROT 6.5  ALBUMIN 3.3*   No results for input(s): LIPASE, AMYLASE in  the last 168 hours. No results for input(s): AMMONIA in the last 168 hours. CBC:  Recent Labs Lab 08/15/15 0854 08/15/15 0903 08/17/15 0321  WBC 13.7*  --  6.8  NEUTROABS 11.6*  --   --   HGB 15.5 17.0 14.0  HCT 46.6 50.0 41.7  MCV 89.3  --  88.7  PLT 146*  --  122*   Cardiac Enzymes:   No results for input(s): CKTOTAL, CKMB, CKMBINDEX, TROPONINI in the last 168 hours. BNP (last 3 results) No results for input(s): BNP in the last 8760 hours.  ProBNP (last 3 results) No results for input(s): PROBNP in the last 8760 hours.  CBG:  Recent  Labs Lab 08/16/15 0604 08/16/15 1118 08/16/15 1629 08/16/15 2149 08/17/15 0641  GLUCAP 123* 88 312* 224* 163*    Recent Results (from the past 240 hour(s))  Urine culture     Status: None (Preliminary result)   Collection Time: 08/15/15 10:45 AM  Result Value Ref Range Status   Specimen Description URINE, CLEAN CATCH  Final   Special Requests NONE  Final   Culture   Final    >=100,000 COLONIES/mL ESCHERICHIA COLI SUSCEPTIBILITIES TO FOLLOW    Report Status PENDING  Incomplete     Studies: No results found.  Scheduled Meds: .  stroke: mapping our early stages of recovery book   Does not apply Once  . cefTRIAXone (ROCEPHIN)  IV  1 g Intravenous Q24H  . clopidogrel  75 mg Oral Daily  . heparin  5,000 Units Subcutaneous 3 times per day  . insulin aspart  0-9 Units Subcutaneous TID WC  . insulin glargine  15 Units Subcutaneous Daily  . metoprolol tartrate  12.5 mg Oral BID  . saccharomyces boulardii  250 mg Oral BID  . simvastatin  20 mg Oral QHS  . tamsulosin  0.8 mg Oral Daily    Continuous Infusions:    Time spent: 74mins  Larrisha Babineau MD, PhD  [facility] Pager [phone]. If 7PM-7AM, please contact night-coverage at [email or web address], password [facility] 08/17/2015, 11:18 AM  LOS: 2 days

## 2015-08-17 NOTE — Progress Notes (Addendum)
STROKE TEAM PROGRESS NOTE   HISTORY OF PRESENT ILLNESS [name] is an 75 y.o. male with history of previous strokes in the past. He does take ASA but not every day. He is also on Plavix. This AM he noted he could not stay standing up right. He mentioned he would fall back against the wall and then into a chair. Family noted he was leaning to the right and tended to fall to the right. Patient felt as if both his lags felt weak. He had no vertigo, dizziness, diplopia, weakness unilaterally or clumsiness. Currently in the bed he feels fine but when sitting up he was notably leaning to the right. He has history of Afib but in NSR.   Date last known well: yesterday Time last known well: Time: 22:00 tPA Given: No: Out of time window   SUBJECTIVE (INTERVAL HISTORY) Patient feels better. No family at bedside.  He states that his dizziness is much improved and he believes his legs have returned to "almost normal " for him.  He does state that he has a funny sound in his ear as if he is hearing through a shell.  Patient admitted to stopping outpatient cipro three days prematurely  OBJECTIVE Temp:  [98.1 F (36.7 C)-99.2 F (37.3 C)] 98.1 F (36.7 C) (02/25 0504) Pulse Rate:  [69-75] 69 (02/25 0504) Cardiac Rhythm:  [-] Normal sinus rhythm;Bundle branch block (02/25 0700) Resp:  [18-20] 18 (02/25 0504) BP: (102-120)/(40-47) 107/40 mmHg (02/25 0504) SpO2:  [96 %-99 %] 98 % (02/25 0504)  CBC:   Recent Labs Lab 08/15/15 0854 08/15/15 0903 08/17/15 0321  WBC 13.7*  --  6.8  NEUTROABS 11.6*  --   --   HGB 15.5 17.0 14.0  HCT 46.6 50.0 41.7  MCV 89.3  --  88.7  PLT 146*  --  122*    Basic Metabolic Panel:   Recent Labs Lab 08/15/15 0854 08/15/15 0903 08/17/15 0321  NA 139 139 139  K 4.4 4.3 4.0  CL 103 99* 104  CO2 26  --  26  GLUCOSE 107* 101* 174*  BUN 17 20 30*  CREATININE 0.71 0.70 0.95  CALCIUM 8.8*  --  8.1*  MG  --   --  1.9    Lipid Panel:     Component Value  Date/Time   CHOL 95 08/16/2015 0240   TRIG 67 08/16/2015 0240   HDL 24* 08/16/2015 0240   CHOLHDL 4.0 08/16/2015 0240   VLDL 13 08/16/2015 0240   LDLCALC 58 08/16/2015 0240   HgbA1c:  Lab Results  Component Value Date   HGBA1C 7.5* 08/16/2015   Urine Drug Screen:     Component Value Date/Time   LABOPIA NONE DETECTED 08/15/2015 0908   COCAINSCRNUR NONE DETECTED 08/15/2015 0908   LABBENZ NONE DETECTED 08/15/2015 0908   AMPHETMU NONE DETECTED 08/15/2015 0908   THCU NONE DETECTED 08/15/2015 0908   LABBARB NONE DETECTED 08/15/2015 0908      IMAGING  Ct Head Wo Contrast 08/15/2015   1. No acute finding or definitive change since 2015.  2. Cerebellar predominant atrophy.  3. 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   Mr [name] Head/brain Wo Cm 08/15/2015   1. No acute intracranial abnormality.  2. Moderate chronic small vessel ischemic disease and cerebral atrophy with chronic infarcts as above.  3. Minimal flow related enhancement in the distal vertebral arteries and basilar artery, similar to the prior MRA. Severe bilateral  vertebral artery stenoses demonstrated on prior catheter angiogram.  4. Mild bilateral ICA, mild right M1, and moderate right M2 stenoses without significant change.     Cerebral angiogram 12/19/2008 1. Bilateral severe vertebrobasilar stenoses distal to the origin of the posterior inferior cerebellar arteries with flow demonstrated into the basilar artery. 2. 50% stenosis of the left vertebral artery at its origin. 3. The patient reports significantly improved neurological function following rehabilitation intervention. He has been asked to continue his aspirin and Plavix. Should he develop symptoms on aspirin and Plavix, more aggressive endovascular intervention will be entertained in the form of angioplasty and/or stenting of the right vertebrobasilar junction and/or the origin of the left vertebral artery. 4.  A follow-up MRI/MRA and/or catheter angiogram will be considered in approximately 6 months. This was discussed with the patient's spouse and daughter.   PHYSICAL EXAM Gen: NAD Eyes: anicteric sclerae, moist conjunctivae                    CV: no MRG, no carotid bruits, no peripheral edema Chest:  CTA Abd:  ND, NT normal bowel sounds EXT:  Contracted fifth digit on right hand (chronic work injury[remote]).  No C/C/E  Neuro: Mental Status: Alert, follows commands.. Oriented to name, date, location.   Speech:    No aphasia, no dysarthria  Cranial Nerves: The pupils are equal, round, and reactive to light..  EOMI. No gaze preference. Visual fields full. Left eye ptosis otherwise face symmetric, facial sensation intact and intact muscles of mastication, Tongue midline. Patient is chronically hard of hearing  Motor: Strength:    Right lower extremity weakness 4+/5 without drift, Otherwise strength appears intact.      Sensation:  Symmetric to LT and pinprick except in bilateral feet.  Patient subjectively states he feels numbness up to the knee but it's worse in the feet  Plantars downgoing.   Coordination: Intact FTN and HTS. Mild right leg dysmetria but not out of proportion to weakness.    PHYSICAL EXAMMr. [name] is a 75 y.o. male with history of atrial fibrillation, hypertension, diabetes mellitus, [place] tooth disease, B-cell lymphoma, as previous strokes, and known vertebral artery disease presenting with weakness and inability to stand. He did not receive IV t-PA due to late presentation.  Possible TIA:  Posterior circulation  Resultant  Resolving deficits  MRI  No acute intracranial abnormality.   MRA  Severe bilateral vertebral artery stenoses demonstrated on prior catheter angiogram.   Carotid Doppler - Bilateral: 1-39% ICA stenosis. Vertebral artery flow is antegrade.   2D Echo - EF 60-65%. No cardiac source of emboli was indentified.  LDL -  58  HgbA1c - 7.5  VTE prophylaxis - subcutaneous heparin Diet Carb Modified Fluid consistency:: Thin; Room service appropriate?: Yes  aspirin 325 mg daily and clopidogrel 75 mg daily prior to admission, now on Plavix 75 mg daily.  Patient counseled to be compliant with his antithrombotic medications  Ongoing aggressive stroke risk factor management  Therapy recommendations: Home health PT and OT  Disposition: Pending  Hypertension  Blood pressure mildly low but no infarct.  The patient would benefit from higher blood pressures with known vertebral artery disease. (SBP>150)   Hyperlipidemia  Home meds:  Simvastatin 20 mg daily resumed in hospital  LDL 58, goal < 70  Continue statin at discharge  Diabetes  HgbA1c 7.5, goal < 7.0  Controlled  Other Stroke Risk Factors  Advanced age  Cigarette smoker, quit smoking in  1992  Hx stroke/TIA  Atrial fibrillation  Severe bilateral vertebral artery stenosis  UTI - day #2 IV Rocephin  Check orthostatic blood pressures   Other Active Problems    Hospital day # 2  Mikey Bussing PA-C [facility] Pager [phone] 08/17/2015, 8:01 AM  ATTENDING NOTE: Personally examined patient and images, and have participated in and made any corrections needed to history, physical, neuro exam,assessment and plan as stated above.  I have personally obtained the history, evaluated lab date, reviewed imaging studies and agree with radiology interpretations. Patient with acute onset weakness and ataxia. MRI of the brain did not show any acute abnormality. Patient's symptoms are resolving. Workup in progress.  Condition: stable  Assessment and plan completed by me personally and fully documented above. Plans/Recommendations include:     With treatment of UTI patient impoving  Check orthostatics  Once patient assessed for fall risk, atrial fibrillation and NOAC should be discussed  May wish to continue to  hold antihypertensive until low-normal BPs normalize  Check B12 and folate; TSH normal  SIGNED BY: Dr. Elissa Hefty      To contact Stroke Continuity provider, please refer to [email or web address] After hours, contact General Neurology

## 2015-08-18 DIAGNOSIS — I480 Paroxysmal atrial fibrillation: Secondary | ICD-10-CM | POA: Diagnosis not present

## 2015-08-18 DIAGNOSIS — I951 Orthostatic hypotension: Secondary | ICD-10-CM | POA: Insufficient documentation

## 2015-08-18 DIAGNOSIS — Z8673 Personal history of transient ischemic attack (TIA), and cerebral infarction without residual deficits: Secondary | ICD-10-CM | POA: Diagnosis not present

## 2015-08-18 DIAGNOSIS — R296 Repeated falls: Secondary | ICD-10-CM | POA: Diagnosis not present

## 2015-08-18 DIAGNOSIS — R531 Weakness: Secondary | ICD-10-CM | POA: Diagnosis not present

## 2015-08-18 LAB — BASIC METABOLIC PANEL
Anion gap: 11 (ref 5–15)
BUN: 30 mg/dL — AB (ref 6–20)
CO2: 23 mmol/L (ref 22–32)
Calcium: 8 mg/dL — ABNORMAL LOW (ref 8.9–10.3)
Chloride: 106 mmol/L (ref 101–111)
Creatinine, Ser: 0.77 mg/dL (ref 0.61–1.24)
GFR calc Af Amer: >60 mL/min (ref >60)
GLUCOSE: 161 mg/dL — AB (ref 65–99)
POTASSIUM: 3.7 mmol/L (ref 3.5–5.1)
Sodium: 140 mmol/L (ref 135–145)

## 2015-08-18 LAB — GLUCOSE, CAPILLARY
GLUCOSE-CAPILLARY: 126 mg/dL — AB (ref 65–99)
GLUCOSE-CAPILLARY: 234 mg/dL — AB (ref 65–99)
Glucose-Capillary: 391 mg/dL — ABNORMAL HIGH (ref 65–99)
Glucose-Capillary: 279 mg/dL — ABNORMAL HIGH (ref 65–99)

## 2015-08-18 LAB — VITAMIN B12: Vitamin B-12: 241 pg/mL (ref 180–914)

## 2015-08-18 MED ORDER — SODIUM CHLORIDE 0.9 % IV SOLN
INTRAVENOUS | Status: AC
  Administered 2015-08-18: 75 mL/h via INTRAVENOUS
  Administered 2015-08-19: 03:00:00 via INTRAVENOUS

## 2015-08-18 NOTE — Progress Notes (Addendum)
PROGRESS NOTE  [name] [id number] DOB: 1940/11/16 DOA: 08/15/2015 PCP: [name] Coffin, MD  HPI/Recap of past 24 hours:  Remain orthostatic sbp dropped from 137 to 83 upon standing, urine culture still pending, remain on iv abx Reported feeling better, denies headache, denies new weakness, neurologist  At bedside  Assessment/Plan: Principal Problem:   Weakness Active Problems:   A-fib ([place])   Diabetes mellitus ([place])   UTI (lower urinary tract infection)   History of CVA (cerebrovascular accident)   Insulin dependent diabetes mellitus ([place])   Falls frequently  Weakness/unsteady gait/actuely worsening of impaired hearing (per daughter was the symptoms of prior cva) -though does has generalized weakness, on focal weakness,  - CT head with no acute findings, MRI brain no acute findings, MRA head with Severe bilateral vertebral artery stenoses demonstrated on prior catheter angiogram , 2-D echo no cardiac emboli, LVEF wnl, telemetry with sinus rhythm, few pac's,  on  aspirin,  Plavix, statin - f/u neuro recommendations, per neuro target sbp >150 due to significant vertebral artery sternoses, neuro will arrange follow up with interventional radiology [name] for severe bilateral vertebral artery stenoses   UTI - Continue with IV Rocephin, culture + ecoli, sensitivity to follow  Diabetes mellitus -  hold oral hypoglycemic agents, continue Lantus but at a lower dose, add insulin sliding scale,  hemoglobin A1c 7.5  Hypertension - hold lisinopril  , continue the rest of home bp meds 2/25 bp low, d/c norvasc, decrease lopressor, check orthostatic vital signs, Per neurology recommendation: Goal of sbp >150 if possible, due to  significant vertebral artery sternoses   History of CVA - Continue with asa, Plavix, ldl 58 (07/2015) on zocor 20mg  po qhs.  A. Fib - Currently in sinus rhythm with a few pac's, on lower dose of betablocker - Not on anticoagulation, giving  his history of multiple falls  Hyperlipidemia - Continue with statin, ldl 58  Mild bilateral lower extremity edema: echo with grade 2 diastolic dysfunction, d/c ivf, edema resolved on 2/26  Orthostatic hypotension: restart ivf on 2/26, monitor volume status. Elevate legs  Chronic venous stasis, left leg superficial wound, wound care consult ordered.  H/o frequent falls; partly due to peripheral neuropathy, patient has diminished sensation bilateral lower extremity  DVT Prophylaxis Heparin   Code Status: full  Family Communication: patient   Disposition Plan: need SNF early next week once urine culture resulted   Consultants:  neurology  Procedures:  none  Antibiotics:  Rocephin from admission   Objective: BP 103/50 mmHg  Pulse 66  Temp(Src) 97.5 F (36.4 C) (Oral)  Resp 18  Ht 5\' 11"  (1.803 m)  Wt 83.3 kg (183 lb 10.3 oz)  BMI 25.62 kg/m2  SpO2 98%  Intake/Output Summary (Last 24 hours) at 08/18/15 1114 Last data filed at 08/18/15 0554  Gross per 24 hour  Intake    360 ml  Output    705 ml  Net   -345 ml   Filed Weights   08/15/15 1944  Weight: 83.3 kg (183 lb 10.3 oz)    Exam:   General:  NAD  Cardiovascular: RRR  Respiratory: CTABL  Abdomen: Soft/ND/NT, positive BS  Musculoskeletal: chronic venous stasis, mild bilateral ankle pitting edema has resolved, superficial wound to left lower leg dressing intact,  Neuro: aaox3, hard of hearing, no focal deficit  Data Reviewed: Basic Metabolic Panel:  Recent Labs Lab 08/15/15 0854 08/15/15 0903 08/17/15 0321 08/18/15 0211  NA 139 139 139 140  K  4.4 4.3 4.0 3.7  CL 103 99* 104 106  CO2 26  --  26 23  GLUCOSE 107* 101* 174* 161*  BUN 17 20 30* 30*  CREATININE 0.71 0.70 0.95 0.77  CALCIUM 8.8*  --  8.1* 8.0*  MG  --   --  1.9  --    Liver Function Tests:  Recent Labs Lab 08/15/15 0854  AST 20  ALT 24  ALKPHOS 59  BILITOT 0.4  PROT 6.5  ALBUMIN 3.3*   No results for  input(s): LIPASE, AMYLASE in the last 168 hours. No results for input(s): AMMONIA in the last 168 hours. CBC:  Recent Labs Lab 08/15/15 0854 08/15/15 0903 08/17/15 0321  WBC 13.7*  --  6.8  NEUTROABS 11.6*  --   --   HGB 15.5 17.0 14.0  HCT 46.6 50.0 41.7  MCV 89.3  --  88.7  PLT 146*  --  122*   Cardiac Enzymes:   No results for input(s): CKTOTAL, CKMB, CKMBINDEX, TROPONINI in the last 168 hours. BNP (last 3 results) No results for input(s): BNP in the last 8760 hours.  ProBNP (last 3 results) No results for input(s): PROBNP in the last 8760 hours.  CBG:  Recent Labs Lab 08/17/15 0641 08/17/15 1115 08/17/15 1623 08/17/15 2159 08/18/15 0603  GLUCAP 163* 182* 277* 163* 126*    Recent Results (from the past 240 hour(s))  Urine culture     Status: None (Preliminary result)   Collection Time: 08/15/15 10:45 AM  Result Value Ref Range Status   Specimen Description URINE, CLEAN CATCH  Final   Special Requests NONE  Final   Culture   Final    >=100,000 COLONIES/mL ESCHERICHIA COLI SUSCEPTIBILITIES TO FOLLOW    Report Status PENDING  Incomplete     Studies: No results found.  Scheduled Meds: .  stroke: mapping our early stages of recovery book   Does not apply Once  . aspirin EC  81 mg Oral Daily  . cefTRIAXone (ROCEPHIN)  IV  1 g Intravenous Q24H  . clopidogrel  75 mg Oral Daily  . heparin  5,000 Units Subcutaneous 3 times per day  . insulin aspart  0-9 Units Subcutaneous TID WC  . insulin glargine  15 Units Subcutaneous Daily  . metoprolol tartrate  12.5 mg Oral BID  . saccharomyces boulardii  250 mg Oral BID  . simvastatin  20 mg Oral QHS  . tamsulosin  0.8 mg Oral Daily    Continuous Infusions: . sodium chloride       Time spent: 11mins  [name] Carradine MD, PhD  [facility] Pager [phone]. If 7PM-7AM, please contact night-coverage at [email or web address], password [facility] 08/18/2015, 11:14 AM  LOS: 3 days

## 2015-08-18 NOTE — Progress Notes (Addendum)
STROKE TEAM PROGRESS NOTE   HISTORY OF PRESENT ILLNESS [name] is an 75 y.o. male with history of previous strokes in the past. He does take ASA but not every day. He is also on Plavix. This AM he noted he could not stay standing up right. He mentioned he would fall back against the wall and then into a chair. Family noted he was leaning to the right and tended to fall to the right. Patient felt as if both his legs felt weak. He had no vertigo, dizziness, diplopia, weakness unilaterally or clumsiness. Currently in the bed he feels fine but when sitting up he was notably leaning to the right. He has history of Afib but in NSR.   Date last known well: yesterday Time last known well: Time: 22:00 tPA Given: No: Out of time window   SUBJECTIVE (INTERVAL HISTORY) Patient feels better. No family at bedside.  He states that his dizziness is much improved except for when he first gets up to move over to a chair.    OBJECTIVE Temp:  [97.5 F (36.4 C)-98.2 F (36.8 C)] 97.5 F (36.4 C) (02/26 0435) Pulse Rate:  [66-71] 66 (02/26 0435) Cardiac Rhythm:  [-] Normal sinus rhythm (02/26 0721) Resp:  [18] 18 (02/26 0435) BP: (86-138)/(45-58) 137/57 mmHg (02/26 0435) SpO2:  [95 %-98 %] 98 % (02/26 0435)  CBC:   Recent Labs Lab 08/15/15 0854 08/15/15 0903 08/17/15 0321  WBC 13.7*  --  6.8  NEUTROABS 11.6*  --   --   HGB 15.5 17.0 14.0  HCT 46.6 50.0 41.7  MCV 89.3  --  88.7  PLT 146*  --  122*    Basic Metabolic Panel:   Recent Labs Lab 08/17/15 0321 08/18/15 0211  NA 139 140  K 4.0 3.7  CL 104 106  CO2 26 23  GLUCOSE 174* 161*  BUN 30* 30*  CREATININE 0.95 0.77  CALCIUM 8.1* 8.0*  MG 1.9  --     Lipid Panel:     Component Value Date/Time   CHOL 95 08/16/2015 0240   TRIG 67 08/16/2015 0240   HDL 24* 08/16/2015 0240   CHOLHDL 4.0 08/16/2015 0240   VLDL 13 08/16/2015 0240   LDLCALC 58 08/16/2015 0240   HgbA1c:  Lab Results  Component Value Date   HGBA1C 7.5*  08/16/2015   Urine Drug Screen:     Component Value Date/Time   LABOPIA NONE DETECTED 08/15/2015 0908   COCAINSCRNUR NONE DETECTED 08/15/2015 0908   LABBENZ NONE DETECTED 08/15/2015 0908   AMPHETMU NONE DETECTED 08/15/2015 0908   THCU NONE DETECTED 08/15/2015 0908   LABBARB NONE DETECTED 08/15/2015 0908      IMAGING  Ct Head Wo Contrast 08/15/2015   1. No acute finding or definitive change since 2015.  2. Cerebellar predominant atrophy.  3. 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   Mr [name] Head/brain Wo Cm 08/15/2015   1. No acute intracranial abnormality.  2. Moderate chronic small vessel ischemic disease and cerebral atrophy with chronic infarcts as above.  3. Minimal flow related enhancement in the distal vertebral arteries and basilar artery, similar to the prior MRA. Severe bilateral vertebral artery stenoses demonstrated on prior catheter angiogram.  4. Mild bilateral ICA, mild right M1, and moderate right M2 stenoses without significant change.   Cerebral angiogram 12/19/2008 1. Bilateral severe vertebrobasilar stenoses distal to the origin of the posterior inferior cerebellar arteries with flow demonstrated into  the basilar artery. 2. 50% stenosis of the left vertebral artery at its origin. 3. The patient reports significantly improved neurological function following rehabilitation intervention. He has been asked  to continue his aspirin and Plavix. Should he develop symptoms on aspirin and Plavix, more aggressive endovascular intervention will be entertained in the form of angioplasty and/or stenting of the right vertebrobasilar junction and/or the origin of the left vertebral artery. 4. A follow-up MRI/MRA and/or catheter angiogram will be considered in approximately 6 months. This was discussed with the patient's spouse and daughter.   PHYSICAL EXAM Gen: NAD Eyes: anicteric sclerae, moist conjunctivae                    CV:  no MRG, no carotid bruits, no peripheral edema Chest:  CTA Abd:  ND, NT normal bowel sounds EXT:  Contracted fifth digit on right hand (chronic work injury[remote]).  No C/C/E  Neuro: Mental Status: Alert, follows commands.. Oriented to name, date, location.   Speech: No aphasia, no dysarthria  Cranial Nerves: The pupils are equal, round, and reactive to light..  EOMI. No gaze preference. Visual fields full. Left eye ptosis otherwise face symmetric, facial sensation intact and intact muscles of mastication, Tongue midline. Patient is chronically hard of hearing  Motor: Strength:    Right lower extremity weakness 4+/5 without drift, Otherwise strength appears intact.      Sensation:  Symmetric to LT and pinprick except in bilateral feet.  Patient subjectively states he feels numbness up to the knee but it's worse in the feet  Plantars downgoing.   Coordination: Intact FTN and HTS. Mild right leg dysmetria but not out of proportion to weakness.    PHYSICAL EXAMMr. [name] is a 75 y.o. male with history of atrial fibrillation, hypertension, diabetes mellitus, Charcot Marie tooth disease, B-cell lymphoma, as previous strokes, and known vertebral artery disease presenting with weakness and inability to stand. He did not receive IV t-PA due to late presentation.  Possible TIA:  Posterior circulation  Resultant  Resolving deficits  MRI  No acute intracranial abnormality.   MRA  Severe bilateral vertebral artery stenoses demonstrated on prior catheter angiogram.   Carotid Doppler - Bilateral: 1-39% ICA stenosis. Vertebral artery flow is antegrade.   2D Echo - EF 60-65%. No cardiac source of emboli was indentified.  LDL - 58  HgbA1c - 7.5  VTE prophylaxis - subcutaneous heparin Diet Carb Modified Fluid consistency:: Thin; Room service appropriate?: Yes  aspirin 325 mg daily and clopidogrel 75 mg daily prior to admission, now on Plavix 75 mg daily.  Patient  counseled to be compliant with his antithrombotic medications  Ongoing aggressive stroke risk factor management  Therapy recommendations: Home health PT and OT  Disposition: Pending  Hypertension  Blood pressure mildly low but no infarct.  The patient would benefit from higher blood pressures with known vertebral artery disease. (SBP>150)  Orthostatic Hypotension  Now, patient noted to have orthostatic hypotension  Primary team initiating maintenance fluids and encouraging PO fluids; Grade 2 diastolic dysfunction. On NS @ 75cc/hr X 1 day BUN 30 Creat 0.77  Orthostatic blood pressures X 3. One set abnormal this AM. 137/57 - 102/53 - 83/49  (Pulse 66 - 67 - 86)  Hyperlipidemia  Home meds:  Simvastatin 20 mg daily resumed in hospital  LDL 58, goal < 70  Continue statin at discharge  Diabetes  HgbA1c 7.5, goal < 7.0  Controlled  Other Stroke Risk Factors  Advanced age  Cigarette smoker, quit smoking in 1992  Hx stroke/TIA  Atrial fibrillation - not anticoagulated.  Severe bilateral vertebral artery stenosis  UTI - on IV Rocephin  Other Active Problems  Severe bilateral vertebral artery stenosis  Atrial fibrillation - not anticoagulated.  B12 and Folate pending  Hospital day # [facility] Pager [phone] 08/18/2015, 8:37 AM  ATTENDING NOTE: Personally examined patient and images, and have participated in and made any corrections needed to history, physical, neuro exam,assessment and plan as stated above.  I have personally obtained the history, evaluated lab date, reviewed imaging studies and agree with radiology interpretations. Patient with acute onset weakness and ataxia. MRI of the brain did not show any acute abnormality. Patient's symptoms are resolving. Workup in progress.  Condition: stable  Assessment and plan completed by me personally and fully documented above. Plans/Recommendations include:      With treatment of UTI patient impoving  Orthostatics positive.  Primary team assessing and managing  Once patient assessed for fall risk, atrial fibrillation and NOAC should be discussed as an outpatient with follow-up with Stroke attending  Currently, patient's symptoms very likely due to hydration status and orthostatics.  However, courtesy call made to Dr. Angelina Sheriff office (neurointerventional radiology) and message left regarding patient's admission.  Likely, his team would follow-up as an outpatient once acute medical comorbidities resolved.  Should patient remain symptomatic despite correction of orthostatic hypotension and hydration status, recommend contacting the [place] office during admission.  May wish to continue to hold antihypertensive until low-normal BPs normalize  B12 and folate for neuropathy work-up; TSH normal  Patient is concerned about lower extremity skin changes and requests a wound care consult  No further stroke team recommendations at this time   SIGNED BY: Dr. Elissa Hefty      To contact Stroke Continuity provider, please refer to [email or web address] After hours, contact General Neurology

## 2015-08-19 DIAGNOSIS — I480 Paroxysmal atrial fibrillation: Secondary | ICD-10-CM | POA: Diagnosis not present

## 2015-08-19 DIAGNOSIS — R531 Weakness: Secondary | ICD-10-CM | POA: Diagnosis not present

## 2015-08-19 DIAGNOSIS — R296 Repeated falls: Secondary | ICD-10-CM | POA: Diagnosis not present

## 2015-08-19 DIAGNOSIS — I771 Stricture of artery: Secondary | ICD-10-CM

## 2015-08-19 DIAGNOSIS — G450 Vertebro-basilar artery syndrome: Secondary | ICD-10-CM

## 2015-08-19 DIAGNOSIS — Z8673 Personal history of transient ischemic attack (TIA), and cerebral infarction without residual deficits: Secondary | ICD-10-CM | POA: Diagnosis not present

## 2015-08-19 DIAGNOSIS — E1159 Type 2 diabetes mellitus with other circulatory complications: Secondary | ICD-10-CM

## 2015-08-19 DIAGNOSIS — E119 Type 2 diabetes mellitus without complications: Secondary | ICD-10-CM | POA: Diagnosis not present

## 2015-08-19 DIAGNOSIS — I6503 Occlusion and stenosis of bilateral vertebral arteries: Secondary | ICD-10-CM

## 2015-08-19 LAB — FOLATE RBC
FOLATE, HEMOLYSATE: 469.8 ng/mL
FOLATE, RBC: 1192 ng/mL (ref >498)
Hematocrit: 39.4 % (ref 37.5–51.0)

## 2015-08-19 LAB — CBC
HEMATOCRIT: 39 % (ref 39.0–52.0)
Hemoglobin: 13.1 g/dL (ref 13.0–17.0)
MCH: 29.2 pg (ref 26.0–34.0)
MCHC: 33.6 g/dL (ref 30.0–36.0)
MCV: 86.9 fL (ref 78.0–100.0)
PLATELETS: 154 10*3/uL (ref 150–400)
RBC: 4.49 MIL/uL (ref 4.22–5.81)
RDW: 13.1 % (ref 11.5–15.5)
WBC: 5.8 10*3/uL (ref 4.0–10.5)

## 2015-08-19 LAB — BASIC METABOLIC PANEL
ANION GAP: 7 (ref 5–15)
BUN: 18 mg/dL (ref 6–20)
CALCIUM: 7.5 mg/dL — AB (ref 8.9–10.3)
CHLORIDE: 109 mmol/L (ref 101–111)
CO2: 23 mmol/L (ref 22–32)
Creatinine, Ser: 0.63 mg/dL (ref 0.61–1.24)
GFR calc non Af Amer: >60 mL/min (ref >60)
Glucose, Bld: 176 mg/dL — ABNORMAL HIGH (ref 65–99)
POTASSIUM: 3.4 mmol/L — AB (ref 3.5–5.1)
Sodium: 139 mmol/L (ref 135–145)

## 2015-08-19 LAB — HEMOGLOBIN A1C
HEMOGLOBIN A1C: 7.2 % — AB (ref 4.8–5.6)
MEAN PLASMA GLUCOSE: 160 mg/dL

## 2015-08-19 LAB — GLUCOSE, CAPILLARY: Glucose-Capillary: 173 mg/dL — ABNORMAL HIGH (ref 65–99)

## 2015-08-19 LAB — CORTISOL: Cortisol, Plasma: 12.7 ug/dL

## 2015-08-19 MED ORDER — ASPIRIN 81 MG PO TBEC: 81.0000 mg | DELAYED_RELEASE_TABLET | Freq: Every day | ORAL | Status: DC

## 2015-08-19 MED ORDER — SULFAMETHOXAZOLE-TRIMETHOPRIM 800-160 MG PO TABS: 1.0000 | ORAL_TABLET | Freq: Two times a day (BID) | ORAL | Status: DC

## 2015-08-19 MED ORDER — HYDROCERIN EX CREA: 1.0000 "application " | TOPICAL_CREAM | Freq: Two times a day (BID) | CUTANEOUS | Status: DC

## 2015-08-19 MED ORDER — METOPROLOL TARTRATE 25 MG PO TABS: 12.5000 mg | ORAL_TABLET | Freq: Two times a day (BID) | ORAL | Status: DC

## 2015-08-19 MED ORDER — APIXABAN 5 MG PO TABS: 5.0000 mg | ORAL_TABLET | Freq: Two times a day (BID) | ORAL | Status: DC

## 2015-08-19 MED ORDER — TAMSULOSIN HCL 0.4 MG PO CAPS: 0.8000 mg | ORAL_CAPSULE | Freq: Every day | ORAL | Status: DC

## 2015-08-19 MED ORDER — HYDROCERIN EX CREA
TOPICAL_CREAM | Freq: Every day | CUTANEOUS | Status: DC
  Filled 2015-08-19: qty 113

## 2015-08-19 NOTE — Progress Notes (Signed)
STROKE TEAM PROGRESS NOTE   SUBJECTIVE (INTERVAL HISTORY) Patient feels better. No family at bedside.  He states that his dizziness is much improved. Orthostatic vitals improved. Pt has hx of afib not on AC due to frequent falls. However, when I asked pt about falls, he said about 4-5 falls in the last 6 months. And recently he is using walker for walk and much less falls. He has been on ASA and plavix since the last stroke in 2010. He has been following with Dr. Estanislado Pandy since last stroke for posterior circulation abnormalities.     OBJECTIVE Temp:  [97.3 F (36.3 C)-97.5 F (36.4 C)] 97.3 F (36.3 C) (02/27 0434) Pulse Rate:  [63-78] 63 (02/27 0434) Cardiac Rhythm:  [-] Normal sinus rhythm (02/27 0900) Resp:  [18] 18 (02/27 0434) BP: (127-176)/(44-63) 127/44 mmHg (02/27 0434) SpO2:  [98 %-99 %] 98 % (02/27 0434)  CBC:   Recent Labs Lab 08/15/15 0854  08/17/15 0321 08/18/15 2001 08/19/15 0240  WBC 13.7*  --  6.8  --  5.8  NEUTROABS 11.6*  --   --   --   --   HGB 15.5  < > 14.0  --  13.1  HCT 46.6  < > 41.7 39.4 39.0  MCV 89.3  --  88.7  --  86.9  PLT 146*  --  122*  --  154  < > = values in this interval not displayed.  Basic Metabolic Panel:   Recent Labs Lab 08/17/15 0321 08/18/15 0211 08/19/15 0240  NA 139 140 139  K 4.0 3.7 3.4*  CL 104 106 109  CO2 26 23 23   GLUCOSE 174* 161* 176*  BUN 30* 30* 18  CREATININE 0.95 0.77 0.63  CALCIUM 8.1* 8.0* 7.5*  MG 1.9  --   --     Lipid Panel:     Component Value Date/Time   CHOL 95 08/16/2015 0240   TRIG 67 08/16/2015 0240   HDL 24* 08/16/2015 0240   CHOLHDL 4.0 08/16/2015 0240   VLDL 13 08/16/2015 0240   LDLCALC 58 08/16/2015 0240   HgbA1c:  Lab Results  Component Value Date   HGBA1C 7.2* 08/17/2015   Urine Drug Screen:     Component Value Date/Time   LABOPIA NONE DETECTED 08/15/2015 0908   COCAINSCRNUR NONE DETECTED 08/15/2015 0908   LABBENZ NONE DETECTED 08/15/2015 0908   AMPHETMU NONE DETECTED  08/15/2015 0908   THCU NONE DETECTED 08/15/2015 0908   LABBARB NONE DETECTED 08/15/2015 0908      IMAGING I have personally reviewed the radiological images below and agree with the radiology interpretations.  Ct Head Wo Contrast 08/15/2015   1. No acute finding or definitive change since 2015.  2. Cerebellar predominant atrophy.  3. 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   [name] Head/brain Wo Cm 08/15/2015   1. No acute intracranial abnormality.  2. Moderate chronic small vessel ischemic disease and cerebral atrophy with chronic infarcts as above.  3. Minimal flow related enhancement in the distal vertebral arteries and basilar artery, similar to the prior MRA. Severe bilateral vertebral artery stenoses demonstrated on prior catheter angiogram.  4. Mild bilateral ICA, mild right M1, and moderate right M2 stenoses without significant change.   Cerebral angiogram 12/19/2008 1. Bilateral severe vertebrobasilar stenoses distal to the origin of the posterior inferior cerebellar arteries with flow demonstrated into the basilar artery. 2. 50% stenosis of the left vertebral artery at its origin. 3.  The patient reports significantly improved neurological function following rehabilitation intervention. He has been asked  to continue his aspirin and Plavix. Should he develop symptoms on aspirin and Plavix, more aggressive endovascular intervention will be entertained in the form of angioplasty and/or stenting of the right vertebrobasilar junction and/or the origin of the left vertebral artery. 4. A follow-up MRI/MRA and/or catheter angiogram will be considered in approximately 6 months. This was discussed with the patient's spouse and daughter.  CUS - Bilateral: 1-39% ICA stenosis. Vertebral artery flow is antegrade.  TTE - - Left ventricle: The cavity size was normal. There was mild focal basal hypertrophy of the septum. Systolic function was  normal. The estimated ejection fraction was in the range of 60% to 65%. Wall motion was normal; there were no regional wall motion abnormalities. Features are consistent with a pseudonormal left ventricular filling pattern, with concomitant abnormal relaxation and increased filling pressure (grade 2 diastolic dysfunction). Doppler parameters are consistent with high ventricular filling pressure. - Aortic valve: Trileaflet; normal thickness, mildly calcified leaflets. - Mitral valve: Calcified annulus. Mild diffuse calcification of the anterior leaflet and posterior leaflet. Impressions: - No cardiac source of emboli was indentified.   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  No C/C/E  Neuro: Mental Status: Alert, follows commands.. Oriented to name, date, location.   Speech: No aphasia, no dysarthria  Cranial Nerves: The pupils are equal, round, and reactive to light..  EOMI. No gaze preference. Visual fields full. Left eye ptosis otherwise face symmetric, facial sensation intact and intact muscles of mastication, Tongue midline. Patient is chronically hard of hearing  Motor: Strength:    Right lower extremity weakness 4+/5 without drift, Otherwise strength appears intact.      Sensation:  Symmetric to LT and pinprick except in bilateral feet.  Patient subjectively states he feels numbness up to the knee but it's worse in the feet  Plantars downgoing.   Coordination: Intact FTN and HTS. Mild right leg dysmetria but not out of proportion to weakness.    PHYSICAL EXAMMr. [name] is a 75 y.o. male with history of atrial fibrillation, hypertension, diabetes mellitus, Charcot Marie tooth disease, B-cell lymphoma,  as previous strokes, and known vertebral artery disease presenting with weakness and inability to stand. He did not receive IV t-PA due to late presentation.  Possible TIA:  Posterior circulation  Resultant  Resolving deficits  MRI  No acute intracranial abnormality.   MRA  Severe bilateral vertebral artery stenoses demonstrated on prior catheter angiogram.   Carotid Doppler unremarkable.   2D Echo - EF 60-65%.  LDL - 58  HgbA1c - 7.5  VTE prophylaxis - subcutaneous heparin Diet Carb Modified Fluid consistency:: Thin; Room service appropriate?: Yes Diet - low sodium heart healthy  aspirin 325 mg daily and clopidogrel 75 mg daily prior to admission, now on ASA 81mg  and Plavix 75 mg daily. Due to hx of afib and strokes, we recommend eliquis for anticoagulation. In addition, recommend ASA 81mg  on top of eliquis for atherosclerosis.   Patient counseled to be compliant with his antithrombotic medications  Ongoing aggressive stroke risk factor management  Therapy recommendations: Home health PT and OT  Disposition: Pending  Afib not on AC  Likely due to fall risk  However, pt did not fall much and now with walker no falls  Due to hx of stroke and this time TIA, we recommend eliquis for anticoagulation. In addition, recommend ASA 81mg  on top of eliquis for atherosclerosis.   Severe bilateral vertebral artery stenosis  Follow up with Dr. Estanislado Pandy  BA also small caliber  Likely due to developmental as pt has robust bilateral PCAs  Would not recommend intervention given the developmental pattern and long segment  Orthostatic Hypotension  noted to have orthostatic hypotension yesterday  Much improved today  Recommend slow arising from bed or chair  Check BP at home  Avoid fall  Follow up with PCP closely  Hyperlipidemia  Home meds:  Simvastatin 20 mg daily resumed in hospital  LDL 58, goal < 70  Continue statin at discharge  Diabetes  HgbA1c 7.5, goal  < 7.0  Uncontrolled  On lantus  SSI  Close follow up with PCP  Other Stroke Risk Factors  Advanced age  Former cigarette smoker, quit smoking in 1992  Hx stroke/TIA in 2010  Other Active Problems  UTI - on IV Rocephin  Hospital day # 4  Neurology will sign off. Please call with questions. Pt will follow up with Dr. Erlinda Hong at [facility] in about 2 months. Thanks for the consult.  Rosalin Hawking, MD PhD Stroke Neurology 08/19/2015 4:05 PM   To contact Stroke Continuity provider, please refer to [email or web address] After hours, contact General Neurology

## 2015-08-19 NOTE — Discharge Summary (Addendum)
Discharge Summary  [name] [id number] DOB: 1940/12/21  PCP: Donnie Coffin, MD  Admit date: 08/15/2015 Discharge date: 08/19/2015  Time spent: >8mins  Recommendations for Outpatient Follow-up:  1. F/u with PMD within a week  for hospital discharge follow up, repeat cbc/bmp at follow up 2. F/u with neurointerventional radiology Dr Estanislado Pandy for vertebral artery stenosis 3. F/u with [facility] Dr Rosalin Hawking for cva in two months 4. F/u with urology for multiple UTI/BPH/h/o urethral stricture 5. Patient is discharged to SNF.  Discharge Diagnoses:  Active Hospital Problems   Diagnosis Date Noted  . Weakness 08/15/2015  . Orthostatic hypotension   . Insulin dependent diabetes mellitus ([place])   . Falls frequently   . UTI (lower urinary tract infection) 09/16/2013  . History of CVA (cerebrovascular accident) 09/16/2013  . Diabetes mellitus ([place]) 10/09/2011  . A-fib [facility])     Resolved Hospital Problems   Diagnosis Date Noted Date Resolved  No resolved problems to display.    Discharge Condition: stable  Diet recommendation: heart healthy/carb modified  Filed Weights   08/15/15 1944  Weight: 83.3 kg (183 lb 10.3 oz)    History of present illness:  [name] is a 75 y.o. male, with significant past medical history of CVA, IDDM, recurrent falls, hx/o afib, hx/o lymphoma s/p resection , patient is on Plavix for his history of CVA, patient presents with complaints of weakness, relates at baseline with a cane/walker , reports generalized weakness when he woke up this morning , as well reports unsteady gait , family noticed him leaning to the right side , he denies any vertigo, dizziness, diplopia, tingling or numbness, code stroke was called in ED, CT head with no acute findings , workup significant for UTI, patient was seen by neurology service, who recommended admission for CVA, rule out.   Hospital Course:  Principal Problem:   Weakness Active  Problems:   A-fib (HCC)   Diabetes mellitus ([place])   UTI (lower urinary tract infection)   History of CVA (cerebrovascular accident)   Insulin dependent diabetes mellitus ([place])   Falls frequently   Orthostatic hypotension  Weakness/unsteady gait/actuely worsening of impaired hearing (per daughter was the symptoms of prior cva) -though does has generalized weakness, on focal weakness,  - CT head with no acute findings, MRI brain no acute findings, MRA head with Severe bilateral vertebral artery stenoses demonstrated on prior catheter angiogram , 2-D echo no cardiac emboli, LVEF wnl, telemetry with sinus rhythm, few pac's,  -  per neuro target sbp >150 due to significant vertebral artery sternoses,  follow up with neurointerventional radiology Dr. Franchot Gallo for severe bilateral vertebral artery stenoses has arranged. Per neurology recommendation, discharge patient on asa 81mg /apixaban/statin, d/c plavix. F/u with neurology in two months. Patient agree with the plan.   UTI - Continue with IV Rocephin, culture + ecoli resistant to quinolones, intermediate to ampiclin/augmentin, sensitive to cephalosporins/bactrim/nitrofurantoin. Discharge on 13more days of bactrim for complicated UTI. Patient reported history of bph and urethral stricture , Outpatient urology follow up.  BPH; increased flomax.   Diabetes mellitus - hold oral hypoglycemic agents, continue Lantus but at a lower dose, add insulin sliding scale, hemoglobin A1c 7.5  Hypertension - hold lisinopril , continue the rest of home bp meds 2/25 bp low, d/c norvasc, d/c lisinopril, decrease lopressor,  Per neurology recommendation: Goal of sbp >150 if possible, due to significant vertebral artery sternoses  Orthostatic hypotension, blood pressure drop significantly upon standing, responded to ivf. Am cortisol 12.7,  recommend compression stocking.  History of CVA - discharged on asa 81mg , apiaxaban, ldl 58 (07/2015) on zocor 20mg   po qhs. -discontinued plavix.  Paroxsymal A. Fib - Currently in sinus rhythm with a few pac's, on lower dose of betablocker - due to prior fall risk, patient has not been on anticoagulation, patient reported now he started to use walker, and is going to rehab, neurology recommended start Anticoagulation for stroke prevention.  Hyperlipidemia - Continue with statin, ldl 58  Mild bilateral lower extremity edema: echo with grade 2 diastolic dysfunction, d/c ivf, edema resolved on 2/26  Orthostatic hypotension: restart ivf on 2/26, monitor volume status. Elevate legs, consider compression stocking.  Chronic venous stasis, left leg superficial wound, wound care consult ordered. Continue wound care at SNF.  H/o frequent falls; partly due to peripheral neuropathy, patient has diminished sensation bilateral lower extremity. Patient reported now he started to use walker, he is better, he is aware of fall precaution due to on anticoagulation for stroke prevention per neurology recommendation.    Code Status: full  Family Communication: patient   Disposition Plan:  SNF  2/27   Consultants:  neurology  Procedures:  none  Antibiotics:  Rocephin from admission  Discharge Exam: BP 127/44 mmHg  Pulse 63  Temp(Src) 97.3 F (36.3 C) (Oral)  Resp 18  Ht 5\' 11"  (1.803 m)  Wt 83.3 kg (183 lb 10.3 oz)  BMI 25.62 kg/m2  SpO2 98%   General: NAD  Cardiovascular: RRR  Respiratory: CTABL  Abdomen: Soft/ND/NT, positive BS  Musculoskeletal: chronic venous stasis, mild bilateral ankle pitting edema has resolved, superficial wound to left lower leg dressing intact,  Neuro: aaox3, hard of hearing, no focal deficit   Discharge Instructions You were cared for by a hospitalist during your hospital stay. If you have any questions about your discharge medications or the care you received while you were in the hospital after you are discharged, you can call the unit and asked to speak  with the hospitalist on call if the hospitalist that took care of you is not available. Once you are discharged, your primary care physician will handle any further medical issues. Please note that NO REFILLS for any discharge medications will be authorized once you are discharged, as it is imperative that you return to your primary care physician (or establish a relationship with a primary care physician if you do not have one) for your aftercare needs so that they can reassess your need for medications and monitor your lab values.      Discharge Instructions    Diet - low sodium heart healthy    Complete by:  As directed   Carb modified     Increase activity slowly    Complete by:  As directed             Medication List    STOP taking these medications        amLODipine 5 MG tablet  Commonly known as:  NORVASC     aspirin 325 MG tablet  Replaced by:  aspirin 81 MG EC tablet     clopidogrel 75 MG tablet  Commonly known as:  PLAVIX     lisinopril 20 MG tablet  Commonly known as:  PRINIVIL,ZESTRIL     silver sulfADIAZINE 1 % cream  Commonly known as:  SILVADENE      TAKE these medications        ACCU-CHEK AVIVA PLUS test strip  Generic drug:  glucose blood  1 each by Other route as needed (blod sugar).     apixaban 5 MG Tabs tablet  Commonly known as:  ELIQUIS  Take 1 tablet (5 mg total) by mouth 2 (two) times daily.     aspirin 81 MG EC tablet  Take 1 tablet (81 mg total) by mouth daily.     insulin glargine 100 UNIT/ML injection  Commonly known as:  LANTUS  Inject 25 Units into the skin daily.     loperamide 2 MG capsule  Commonly known as:  IMODIUM  Take 2 mg by mouth daily as needed for diarrhea or loose stools.     metFORMIN 500 MG tablet  Commonly known as:  GLUCOPHAGE  Take 500 mg by mouth daily with breakfast.     metoprolol tartrate 25 MG tablet  Commonly known as:  LOPRESSOR  Take 0.5 tablets (12.5 mg total) by mouth 2 (two) times daily.      saccharomyces boulardii 250 MG capsule  Commonly known as:  FLORASTOR  Take 1 capsule (250 mg total) by mouth 2 (two) times daily.     simvastatin 20 MG tablet  Commonly known as:  ZOCOR  Take 20 mg by mouth at bedtime.     sulfamethoxazole-trimethoprim 800-160 MG tablet  Commonly known as:  BACTRIM DS,SEPTRA DS  Take 1 tablet by mouth every 12 (twelve) hours.     tamsulosin 0.4 MG Caps capsule  Commonly known as:  FLOMAX  Take 2 capsules (0.8 mg total) by mouth daily.       Allergies  Allergen Reactions  . Actos [Pioglitazone Hydrochloride] Other (See Comments)    Afib  . Hydrochlorothiazide Other (See Comments)    Lower potassium to low  . Penicillins Rash    Tolerated cefepime, ceftriaxone   Follow-up Information    Follow up with [facility] .   Specialty:  Skilled Nursing Facility   Contact information:   [address] [place] [phone]      Follow up with Malka So, MD In 1 month.   Specialty:  Urology   Why:  multiple UTI, bph and h/o uretheral stricture   Contact information:   [place] [address] [phone]       Follow up with Donnie Coffin, MD In 1 week.   Specialty:  Family Medicine   Why:  hospital discharge follow up   Contact information:   [address] [place] [address] [phone]       Follow up with [facility] In 1 month.   Why:  for stroke, with neurologist Dr. Chase Caller information:   [address]     [address] [place] [phone]      Follow up with Rob Hickman, MD On 09/03/2015.   Specialty:  Interventional Radiology   Why:  vertebral artery stenosis   Contact information:   [address] [place] [address] [phone]        The results of significant diagnostics from this hospitalization (including imaging, microbiology, ancillary and laboratory)  are listed below for reference.    Significant Diagnostic Studies: Ct Head Wo Contrast  08/15/2015  CLINICAL DATA:  Weakness and dizziness.  History of stroke EXAM: CT HEAD WITHOUT CONTRAST TECHNIQUE: Contiguous axial images were obtained from the base of the skull through the vertex without intravenous contrast. COMPARISON:  05/29/2014 FINDINGS: Skull and Sinuses:Negative for  fracture or destructive process. Small, benign left frontal calvarium osteoma. Mild mucosal thickening in the paranasal sinuses. No fluid level. No mastoiditis to correlate with dizziness. Visualized orbits: Optic drusen, usually incidental. Bilateral cataract resection. Brain: Atrophy, asymmetrically advanced to the cerebellum, similar to 2015. Remote small vessel infarct in the upper left cerebellar hemisphere. Patchy small vessel ischemic change in the cerebral white matter, greatest around the lateral ventricles. No evidence of acute infarct, hemorrhage, hydrocephalus, or mass lesion. Calcified intracranial atherosclerosis. IMPRESSION: 1. No acute finding or definitive change since 2015. 2. Cerebellar predominant atrophy. 3. 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 Electronically Signed   By: Monte Fantasia M.D.   On: 08/15/2015 09:29   Mr Brain Wo Contrast  08/15/2015  CLINICAL DATA:  Weakness.  Unsteady gait.  Prior stroke. EXAM: MRI HEAD WITHOUT CONTRAST MRA HEAD WITHOUT CONTRAST TECHNIQUE: Multiplanar, multiecho pulse sequences of the brain and surrounding structures were obtained without intravenous contrast. Angiographic images of the head were obtained using MRA technique without contrast. COMPARISON:  Head CT 08/15/2015, MRI 05/30/2014, and MRA 12/03/2008. Cerebral angiograms 12/04/2008 and 12/19/2008. FINDINGS: MRI HEAD FINDINGS There is no evidence of acute infarct, mass, midline shift, or extra-axial fluid collection. A small, chronic hemorrhage in the left occipital lobe is  unchanged. Patchy T2 hyperintensities in the periventricular greater than subcortical cerebral white matter bilaterally are similar to the prior MRI and compatible with moderate chronic small vessel ischemic disease. Chronic infarcts are again seen in the brainstem and left cerebellum. There is moderate cerebral and cerebellar atrophy. Prior bilateral cataract extraction is noted. Mild paranasal sinus mucosal thickening is present. Mastoid air cells are clear. Major intracranial vascular flow voids are preserved. A chronic white matter lacunar infarct in the left parietal lobe is unchanged. MRA HEAD FINDINGS There is very poor signal in the distal vertebral arteries and basilar artery with only minimal intermittent flow related enhancement seen. The appearance is similar to that on the prior MRA, with subsequent angiogram at that time demonstrating severe vertebral artery stenoses bilaterally. Right PICA appears patent. There are patent posterior communicating arteries bilaterally which provides the dominant supply the PCAs. No significant proximal PCA stenosis is identified allowing for mild motion artifact throughout this region. The internal carotid arteries are patent from skullbase to carotid termini with mild irregularity noted bilaterally as well as mild narrowing of the proximal portions of both cavernous segments, similar to prior. The right A1 segment is hypoplastic. Left A1 segment is dominant without significant stenosis. Mild right P1 narrowing is unchanged, as is moderate proximal right M2 narrowing near the bifurcation. No significant proximal left MCA stenosis is seen. No intracranial aneurysm is identified. IMPRESSION: 1. No acute intracranial abnormality. 2. Moderate chronic small vessel ischemic disease and cerebral atrophy with chronic infarcts as above. 3. Minimal flow related enhancement in the distal vertebral arteries and basilar artery, similar to the prior MRA. Severe bilateral vertebral  artery stenoses demonstrated on prior catheter angiogram. 4. Mild bilateral ICA, mild right M1, and moderate right M2 stenoses without significant change. Electronically Signed   By: Logan Bores M.D.   On: 08/15/2015 15:59   [name] Head/brain Wo Cm  08/15/2015  CLINICAL DATA:  Weakness.  Unsteady gait.  Prior stroke. EXAM: MRI HEAD WITHOUT CONTRAST MRA HEAD WITHOUT CONTRAST TECHNIQUE: Multiplanar, multiecho pulse sequences of the brain and surrounding structures were obtained without intravenous contrast. Angiographic images of the head were obtained using MRA technique without contrast. COMPARISON:  Head CT 08/15/2015, MRI 05/30/2014, and MRA 12/03/2008. Cerebral angiograms 12/04/2008 and 12/19/2008. FINDINGS: MRI HEAD FINDINGS There is no evidence of acute infarct, mass, midline shift, or extra-axial fluid collection. A small, chronic hemorrhage in the left occipital lobe is unchanged. Patchy T2 hyperintensities in the periventricular greater than subcortical cerebral white matter bilaterally are similar to the prior MRI and compatible with moderate chronic small vessel ischemic disease. Chronic infarcts are again seen in the brainstem and left cerebellum. There is moderate cerebral and cerebellar atrophy. Prior bilateral cataract extraction is noted. Mild paranasal sinus mucosal thickening is present. Mastoid air cells are clear. Major intracranial vascular flow voids are preserved. A chronic white matter lacunar infarct in the left parietal lobe is unchanged. MRA HEAD FINDINGS There is very poor signal in the distal vertebral arteries and basilar artery with only minimal intermittent flow related enhancement seen. The appearance is similar to that on the prior MRA, with subsequent angiogram at that time demonstrating severe vertebral artery stenoses bilaterally. Right PICA appears patent. There are patent posterior communicating arteries bilaterally which provides the dominant supply the PCAs. No significant  proximal PCA stenosis is identified allowing for mild motion artifact throughout this region. The internal carotid arteries are patent from skullbase to carotid termini with mild irregularity noted bilaterally as well as mild narrowing of the proximal portions of both cavernous segments, similar to prior. The right A1 segment is hypoplastic. Left A1 segment is dominant without significant stenosis. Mild right P1 narrowing is unchanged, as is moderate proximal right M2 narrowing near the bifurcation. No significant proximal left MCA stenosis is seen. No intracranial aneurysm is identified. IMPRESSION: 1. No acute intracranial abnormality. 2. Moderate chronic small vessel ischemic disease and cerebral atrophy with chronic infarcts as above. 3. Minimal flow related enhancement in the distal vertebral arteries and basilar artery, similar to the prior MRA. Severe bilateral vertebral artery stenoses demonstrated on prior catheter angiogram. 4. Mild bilateral ICA, mild right M1, and moderate right M2 stenoses without significant change. Electronically Signed   By: Logan Bores M.D.   On: 08/15/2015 15:59    Microbiology: Recent Results (from the past 240 hour(s))  Urine culture     Status: None (Preliminary result)   Collection Time: 08/15/15 10:45 AM  Result Value Ref Range Status   Specimen Description URINE, CLEAN CATCH  Final   Special Requests NONE  Final   Culture >=100,000 COLONIES/mL ESCHERICHIA COLI  Final   Report Status PENDING  Incomplete   Organism ID, Bacteria ESCHERICHIA COLI  Final      Susceptibility   Escherichia coli - MIC*    AMPICILLIN 16 INTERMEDIATE Intermediate     CEFAZOLIN <=4 SENSITIVE Sensitive     CEFTRIAXONE <=1 SENSITIVE Sensitive     CIPROFLOXACIN >=4 RESISTANT Resistant     GENTAMICIN <=1 SENSITIVE Sensitive     IMIPENEM <=0.25 SENSITIVE Sensitive     NITROFURANTOIN <=16 SENSITIVE Sensitive     TRIMETH/SULFA <=20 SENSITIVE Sensitive     AMPICILLIN/SULBACTAM 4  INTERMEDIATE Intermediate     PIP/TAZO 16 SENSITIVE Sensitive     * >=100,000 COLONIES/mL ESCHERICHIA COLI     Labs: Basic Metabolic Panel:  Recent Labs Lab 08/15/15 0854 08/15/15 0903 08/17/15 0321 08/18/15 0211 08/19/15 0240  NA 139 139 139 140 139  K 4.4 4.3 4.0 3.7 3.4*  CL 103 99* 104 106 109  CO2 26  --  26 23 23   GLUCOSE 107* 101* 174* 161* 176*  BUN 17 20 30*  30* 18  CREATININE 0.71 0.70 0.95 0.77 0.63  CALCIUM 8.8*  --  8.1* 8.0* 7.5*  MG  --   --  1.9  --   --    Liver Function Tests:  Recent Labs Lab 08/15/15 0854  AST 20  ALT 24  ALKPHOS 59  BILITOT 0.4  PROT 6.5  ALBUMIN 3.3*   No results for input(s): LIPASE, AMYLASE in the last 168 hours. No results for input(s): AMMONIA in the last 168 hours. CBC:  Recent Labs Lab 08/15/15 0854 08/15/15 0903 08/17/15 0321 08/19/15 0240  WBC 13.7*  --  6.8 5.8  NEUTROABS 11.6*  --   --   --   HGB 15.5 17.0 14.0 13.1  HCT 46.6 50.0 41.7 39.0  MCV 89.3  --  88.7 86.9  PLT 146*  --  122* 154   Cardiac Enzymes: No results for input(s): CKTOTAL, CKMB, CKMBINDEX, TROPONINI in the last 168 hours. BNP: BNP (last 3 results) No results for input(s): BNP in the last 8760 hours.  ProBNP (last 3 results) No results for input(s): PROBNP in the last 8760 hours.  CBG:  Recent Labs Lab 08/18/15 0603 08/18/15 1128 08/18/15 1609 08/18/15 2044 08/19/15 0630  GLUCAP 126* 234* 391* 279* 173*       Signed:  Pearlean Sabina MD, PhD  [facility] 08/19/2015, 11:53 AM

## 2015-08-19 NOTE — Care Management Note (Signed)
Case Management Note Marvetta Gibbons RN, BSN Unit 2W-Case Manager [phone]  Patient Details  Name: [name] MRN: [id number] Date of Birth: 04-09-1941  Subjective/Objective:   Pt admitted with weakness                 Action/Plan: PTA pt lived at home alone- per conversation with pt and daughter pt has not wanted daughter to help him at home wife is in a nursing home- currently pt under observation status and does not qualify for a rehab stay under Medicare guidelines- per daughter pt could not afford a rehab stay out of pocket- pt and daughter understand that home with [facility] would not mean someone would be there 24/7- unless daughter provided this arranged out of pocket. Spoke with CSW and [employer] does approve STSNF in some cases without a 3 night qualifying stay- CSW to f/u on potential STSNF placement with pt and daughter as pt at this time would be unsafe at home. CM to continue to follow.   Expected Discharge Date:   08/19/15               Expected Discharge Plan:  Skilled Nursing Facility  In-House Referral:  Clinical Social Work  Discharge planning Services  CM Consult  Post Acute Care Choice:    Choice offered to:     DME Arranged:    DME Agency:     HH Arranged:    [place] Agency:     Status of Service:  Completed, signed off  Medicare Important Message Given:  Yes Date Medicare IM Given:    Medicare IM give by:    Date Additional Medicare IM Given:    Additional Medicare Important Message give by:     If discussed at [place] of Stay Meetings, dates discussed:    Discharge Disposition: skilled facility   Additional Comments:  [name], RN 08/19/2015, 10:59 AM

## 2015-08-19 NOTE — Progress Notes (Signed)
Report given to [facility].

## 2015-08-19 NOTE — Consult Note (Addendum)
WOC wound consult note Reason for Consult: Consult requested for left leg.  Pt states he previously had a wound to this location which has healed at this time.  Bilat legs without open wounds, drainage, or leakage.  Generalized dry peeling skin to bilat calves. Dressing procedure/placement/frequency: Eucerin cream to assist with removal of loose skin.  Discussed plan of care with patient and he verbalized understanding. Please re-consult if further assistance is needed.  Thank-you,  Julien Girt MSN, [place], [place], [place], [place]

## 2015-08-19 NOTE — Progress Notes (Signed)
Occupational Therapy Treatment Patient Details Name: [name] MRN: [id number] DOB: February 18, 1941 Today's Date: 08/19/2015    History of present illness Patient is a 75 y/o male with hx of CVA, IDDM, recurrent dalls, A-fib, lymphoma s/p resection in remission presents with unsteady gait, falls, and UTI. MRI and head CT-unremarkable. Workup pending.    OT comments  Pt demonstrating improvement in general strength and mobility for standing grooming and toileting. Daughter cannot provide 24 hour care and pt remains a fall risk with impaired cognition.  Recommending SNF for ST rehab and pt is in agreement.  Follow Up Recommendations  SNF;Supervision/Assistance - 24 hour    Equipment Recommendations       Recommendations for Other Services      Precautions / Restrictions Precautions Precautions: Fall Precaution Comments: hx of multiple falls per daughter Restrictions Weight Bearing Restrictions: No       Mobility Bed Mobility Overal bed mobility: Modified Independent                Transfers Overall transfer level: Needs assistance Equipment used: Rolling walker (2 wheeled) Transfers: Sit to/from Stand Sit to Stand: Min guard         General transfer comment: close guard upon initially standing, unsteady    Balance     Sitting balance-Leahy Scale: Good       Standing balance-Leahy Scale: Fair                     ADL Overall ADL's : Needs assistance/impaired     Grooming: Min guard;Wash/dry hands;Standing               Lower Body Dressing: Min guard;Sit to/from stand   Toilet Transfer: Min guard;Ambulation;RW   Toileting- Clothing Manipulation and Hygiene: Min guard       Functional mobility during ADLs: Patent attorney      Cognition   Behavior During Therapy: WFL for tasks assessed/performed Overall Cognitive Status: History of cognitive  impairments - at baseline       Memory: Decreased short-term memory               Extremity/Trunk Assessment               Exercises     Shoulder Instructions       General Comments      Pertinent Vitals/ Pain       Pain Assessment: No/denies pain  Home Living                                          Prior Functioning/Environment              Frequency Min 2X/week     Progress Toward Goals  OT Goals(current goals can now be found in the care plan section)  Progress towards OT goals: Progressing toward goals     Plan Discharge plan needs to be updated    Co-evaluation                 End of Session Equipment Utilized During Treatment: Gait belt;Rolling walker   Activity Tolerance Patient tolerated treatment well   Patient Left in chair;with call bell/phone  within reach;with chair alarm set   Nurse Communication          Time: [phone] OT Time Calculation (min): 20 min  Charges: OT General Charges $OT Visit: 1 Procedure OT Treatments $Self Care/Home Management : 8-22 mins  [name] 08/19/2015, 12:49 PM  [phone]

## 2015-08-20 LAB — URINE CULTURE: Culture: >=100000

## 2015-09-03 ENCOUNTER — Ambulatory Visit (HOSPITAL_COMMUNITY)
Admission: RE | Admit: 2015-09-03 | Discharge: 2015-09-03 | Disposition: A | Discharge status: Home / Self Care | Payer: Medicare Other | Source: Ambulatory Visit | Attending: Interventional Radiology | Admitting: Interventional Radiology

## 2015-09-03 ENCOUNTER — Ambulatory Visit (HOSPITAL_COMMUNITY): Admit: 2015-09-03 | Payer: Medicare Other

## 2015-09-03 ENCOUNTER — Other Ambulatory Visit (HOSPITAL_COMMUNITY): Payer: Self-pay | Admitting: Interventional Radiology

## 2015-09-03 DIAGNOSIS — I771 Stricture of artery: Secondary | ICD-10-CM

## 2015-09-21 DIAGNOSIS — R6510 Systemic inflammatory response syndrome (SIRS) of non-infectious origin without acute organ dysfunction: Secondary | ICD-10-CM

## 2015-09-21 HISTORY — DX: Systemic inflammatory response syndrome (sirs) of non-infectious origin without acute organ dysfunction: R65.10

## 2015-09-22 ENCOUNTER — Inpatient Hospital Stay (HOSPITAL_COMMUNITY)
Admission: EM | Admit: 2015-09-22 | Discharge: 2015-09-25 | DRG: 690 | Disposition: A | Discharge status: Skilled Nursing Facility | Payer: Medicare Other | Attending: Internal Medicine | Admitting: Internal Medicine

## 2015-09-22 ENCOUNTER — Encounter (HOSPITAL_COMMUNITY): Payer: Self-pay | Admitting: *Deleted

## 2015-09-22 DIAGNOSIS — R296 Repeated falls: Secondary | ICD-10-CM | POA: Diagnosis present

## 2015-09-22 DIAGNOSIS — IMO0002 Reserved for concepts with insufficient information to code with codable children: Secondary | ICD-10-CM | POA: Diagnosis present

## 2015-09-22 DIAGNOSIS — Z87891 Personal history of nicotine dependence: Secondary | ICD-10-CM

## 2015-09-22 DIAGNOSIS — R509 Fever, unspecified: Secondary | ICD-10-CM | POA: Diagnosis not present

## 2015-09-22 DIAGNOSIS — Z8572 Personal history of non-Hodgkin lymphomas: Secondary | ICD-10-CM

## 2015-09-22 DIAGNOSIS — I480 Paroxysmal atrial fibrillation: Secondary | ICD-10-CM | POA: Diagnosis present

## 2015-09-22 DIAGNOSIS — Z7902 Long term (current) use of antithrombotics/antiplatelets: Secondary | ICD-10-CM

## 2015-09-22 DIAGNOSIS — R197 Diarrhea, unspecified: Secondary | ICD-10-CM | POA: Diagnosis present

## 2015-09-22 DIAGNOSIS — Z794 Long term (current) use of insulin: Secondary | ICD-10-CM

## 2015-09-22 DIAGNOSIS — W19XXXA Unspecified fall, initial encounter: Secondary | ICD-10-CM

## 2015-09-22 DIAGNOSIS — N390 Urinary tract infection, site not specified: Secondary | ICD-10-CM | POA: Diagnosis not present

## 2015-09-22 DIAGNOSIS — E1165 Type 2 diabetes mellitus with hyperglycemia: Secondary | ICD-10-CM | POA: Diagnosis present

## 2015-09-22 DIAGNOSIS — Z923 Personal history of irradiation: Secondary | ICD-10-CM

## 2015-09-22 DIAGNOSIS — B9620 Unspecified Escherichia coli [E. coli] as the cause of diseases classified elsewhere: Secondary | ICD-10-CM | POA: Diagnosis present

## 2015-09-22 DIAGNOSIS — Z8673 Personal history of transient ischemic attack (TIA), and cerebral infarction without residual deficits: Secondary | ICD-10-CM

## 2015-09-22 DIAGNOSIS — R6510 Systemic inflammatory response syndrome (SIRS) of non-infectious origin without acute organ dysfunction: Secondary | ICD-10-CM | POA: Diagnosis present

## 2015-09-22 DIAGNOSIS — Z7982 Long term (current) use of aspirin: Secondary | ICD-10-CM

## 2015-09-22 DIAGNOSIS — I10 Essential (primary) hypertension: Secondary | ICD-10-CM | POA: Diagnosis present

## 2015-09-22 DIAGNOSIS — Z9221 Personal history of antineoplastic chemotherapy: Secondary | ICD-10-CM

## 2015-09-22 DIAGNOSIS — R55 Syncope and collapse: Secondary | ICD-10-CM | POA: Diagnosis present

## 2015-09-22 HISTORY — DX: Systemic inflammatory response syndrome (sirs) of non-infectious origin without acute organ dysfunction: R65.10

## 2015-09-22 LAB — CBG MONITORING, ED: Glucose-Capillary: 180 mg/dL — ABNORMAL HIGH (ref 65–99)

## 2015-09-22 NOTE — ED Provider Notes (Signed)
CSN: [id number]     Arrival date & time 09/22/15  2338 History   By signing my name below, I, [name], attest that this documentation has been prepared under the direction and in the presence of Merryl Hacker, MD.  Electronically Signed: Forrestine [name], ED Scribe. 09/23/2015. 12:42 AM.   Chief Complaint  Patient presents with  . Weakness  . Fall   The history is provided by the patient. No language interpreter was used.    HPI Comments: [name] brought in by EMS is a 75 y.o. male with a PMHx of HTN, DM, Stroke-R sided deficits, A-Fib, and B-cell lymphoma who presents to the Emergency Department here after a fall sustained just prior to arrival. Pt states he was attempting to head to the bathroom and fell to the ground landing on his bottom as his lower extremities became weak. No head trauma or LOC. He denies any pain at the time. However, pt reports ongoing generalized weakness and urinary frequency. No recent fever, chills, nausea, vomiting, diarrhea, chest pain, or shortness of breath. Pt admits to a history of falls and states at time of last fall, pt was diagnosed with a urinary tract infection. Pt takes Plavix daily.Denies recent fevers. Does report 2 weeks ago he had congestion and cough. He states "I had a cold." He reports improvement of the symptoms.  PCP: Donnie Coffin, MD    Past Medical History  Diagnosis Date  . 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 Hypertension   . Diabetes mellitus   . A-fib ([place])   . Cataract   . Charcot-Marie-Tooth disease   . 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 Stroke [facility])    History reviewed. No pertinent past surgical history. Family History  Problem Relation Age of Onset  . Hypertension Mother   . Hypertension Father    Social History  Substance Use Topics  . Smoking status: Former Smoker -- 2.00 packs/day for 35 years    Quit date: 06/03/1991  . Smokeless tobacco: Former Systems developer     Quit date: 06/26/1990  . Alcohol Use: No    Review of Systems  Constitutional: Negative for fever and chills.  Respiratory: Negative for shortness of breath.   Cardiovascular: Negative for chest pain.  Gastrointestinal: Negative for nausea, vomiting, abdominal pain and diarrhea.  Genitourinary: Positive for frequency.  Skin: Negative for rash.  Neurological: Positive for weakness. Negative for dizziness and headaches.  Psychiatric/Behavioral: Negative for confusion.  All other systems reviewed and are negative.     Allergies  Actos; Hydrochlorothiazide; and Penicillins  Home Medications   Prior to Admission medications   Medication Sig Start Date End Date Taking? Authorizing Provider  aspirin EC 81 MG EC tablet Take 1 tablet (81 mg total) by mouth daily. Patient taking differently: Take 81 mg by mouth 3 (three) times a week. Monday, Wednesday and Friday 08/19/15  Yes Florencia Reasons, MD  clopidogrel (PLAVIX) 75 MG tablet Take 75 mg by mouth daily.   Yes Historical Provider, MD  insulin glargine (LANTUS) 100 UNIT/ML injection Inject 25 Units into the skin daily.    Yes Historical Provider, MD  loperamide (IMODIUM) 2 MG capsule Take 2 mg by mouth daily as needed for diarrhea or loose stools.   Yes Historical Provider, MD  metFORMIN (GLUCOPHAGE) 500 MG tablet Take 500 mg by mouth daily with breakfast.  05/10/13  Yes Historical Provider, MD  metoprolol tartrate (LOPRESSOR)  25 MG tablet Take 0.5 tablets (12.5 mg total) by mouth 2 (two) times daily. Patient taking differently: Take 25 mg by mouth 2 (two) times daily.  08/19/15  Yes Florencia Reasons, MD  saccharomyces boulardii (FLORASTOR) 250 MG capsule Take 1 capsule (250 mg total) by mouth 2 (two) times daily. Patient taking differently: Take 250 mg by mouth 2 (two) times daily as needed (stomache).  09/18/13  Yes Thurnell Lose, MD  simvastatin (ZOCOR) 20 MG tablet Take 20 mg by mouth at bedtime.     Yes Historical Provider, MD  tamsulosin (FLOMAX)  0.4 MG CAPS capsule Take 2 capsules (0.8 mg total) by mouth daily. 08/19/15  Yes Florencia Reasons, MD   Triage Vitals: BP 112/56 mmHg  Pulse 79  Temp(Src) 101.3 F (38.5 C) (Rectal)  Resp 16  SpO2 96%   Physical Exam  Constitutional: He is oriented to person, place, and time. No distress.  Elderly, no acute distress  HENT:  Head: Normocephalic and atraumatic.  Mouth/Throat: Oropharynx is clear and moist.  Eyes: Pupils are equal, round, and reactive to light.  Cardiovascular: Normal rate, regular rhythm and normal heart sounds.   No murmur heard. Pulmonary/Chest: Effort normal and breath sounds normal. No respiratory distress. He has no wheezes.  Abdominal: Soft. Bowel sounds are normal. There is no tenderness. There is no rebound.  Genitourinary:  Uncircumcised penis, mild erythema to the scrotum, no crepitus or tenderness noted  Musculoskeletal: He exhibits no edema.  Abrasion to the right knee with mild erythema, normal range of motion  Neurological: He is alert and oriented to person, place, and time.  Cranial nerves II through XII intact, strength appears her baseline with 4+ out of 5 right grip strength, 5 out of 5 strength otherwise  Skin: Skin is warm and dry.  Psychiatric: He has a normal mood and affect.  Nursing note and vitals reviewed.   ED Course  Procedures (including critical care time)  DIAGNOSTIC STUDIES: Oxygen Saturation is 94% on RA, adequate by my interpretation.    COORDINATION OF CARE: 12:26 AM- Will order blood work, CXR, and urinalysis. Will give fluids. Discussed treatment plan with pt at bedside and pt agreed to plan.     Labs Review Labs Reviewed  BASIC METABOLIC PANEL - Abnormal; Notable for the following:    Glucose, Bld 189 (*)    BUN 21 (*)    All other components within normal limits  CBC - Abnormal; Notable for the following:    WBC 22.3 (*)    All other components within normal limits  URINALYSIS, ROUTINE W REFLEX MICROSCOPIC (NOT AT [facility]) -  Abnormal; Notable for the following:    APPearance CLOUDY (*)    Glucose, UA 250 (*)    Leukocytes, UA SMALL (*)    All other components within normal limits  URINE MICROSCOPIC-ADD ON - Abnormal; Notable for the following:    Squamous Epithelial / LPF 0-5 (*)    Bacteria, UA MANY (*)    Casts HYALINE CASTS (*)    All other components within normal limits  CBG MONITORING, ED - Abnormal; Notable for the following:    Glucose-Capillary 180 (*)    All other components within normal limits  I-STAT CG4 LACTIC ACID, ED - Abnormal; Notable for the following:    Lactic Acid, Venous 2.29 (*)    All other components within normal limits  URINE CULTURE  CULTURE, BLOOD (ROUTINE X 2)  CULTURE, BLOOD (ROUTINE X 2)  INFLUENZA PANEL  BY PCR (TYPE A & B, H1N1)  I-STAT CG4 LACTIC ACID, ED    Imaging Review Dg Chest 2 View  09/23/2015  CLINICAL DATA:  Leukocytosis EXAM: CHEST  2 VIEW COMPARISON:  05/29/2014 FINDINGS: Cardiac shadow is within normal limits. The lungs are well aerated bilaterally. No focal infiltrate or sizable effusion is seen. No acute bony abnormality is noted. IMPRESSION: No active cardiopulmonary disease. Electronically Signed   By: Inez Catalina M.D.   On: 09/23/2015 01:47   I have personally reviewed and evaluated these images and lab results as part of my medical decision-making.   EKG Interpretation   Date/Time:  Sunday September 22 2015 23:46:50 EDT Ventricular Rate:  100 PR Interval:  182 QRS Duration: 131 QT Interval:  356 QTC Calculation: 459 R Axis:   -70 Text Interpretation:  Sinus tachycardia RBBB and LAFB Confirmed by Annitta Fifield   MD, Loma Sousa ([id number]) on 09/23/2015 1:30:24 AM      MDM   Final diagnoses:  Fall, initial encounter  Other specified fever    Patient presents following a fall.  Patient reports a history of the same in the setting of urinary tract infection. He has a history of stroke with very mild right-sided deficits. Denies any new deficits. Appears to  be his baseline on exam. No obvious signs of external trauma.  Rectal temp 101.3. Otherwise vital signs are reassuring. Lactate mildly elevated at 2.29. Patient was given 30 mL/kg of fluid. Lab work including cultures, urine cultures, urinalysis, and chest x-ray obtained. CT head also obtained. Lab work is notable for white count of 22. Otherwise at the patient's baseline. Chest x-ray is unremarkable. Urinalysis was 0-5 white cells and many bacteria. Patient is very well-appearing on exam. However, given fever and leukocytosis, will treat for presumptive urinary tract infection. I have also sent an influenza screen given recent upper respiratory symptoms. Will admit for further workup.  I personally performed the services described in this documentation, which was scribed in my presence. The recorded information has been reviewed and is accurate.   Merryl Hacker, MD 09/23/15 [phone]

## 2015-09-22 NOTE — ED Notes (Signed)
Pt arrives via EMS from home. States he has been experiencing urinary frequency and weakness, tonight his legs gave out and he fell and landed on his buttocks. Did not hit his head or loose consciousness. Pt is on blood thinners. Denies pain.

## 2015-09-22 NOTE — ED Notes (Signed)
Checked cbg 180, RN Cassie informed

## 2015-09-23 ENCOUNTER — Emergency Department (HOSPITAL_COMMUNITY): Payer: Medicare Other

## 2015-09-23 ENCOUNTER — Inpatient Hospital Stay (HOSPITAL_COMMUNITY): Payer: Medicare Other

## 2015-09-23 ENCOUNTER — Encounter (HOSPITAL_COMMUNITY): Payer: Self-pay | Admitting: Radiology

## 2015-09-23 DIAGNOSIS — Z9221 Personal history of antineoplastic chemotherapy: Secondary | ICD-10-CM | POA: Diagnosis not present

## 2015-09-23 DIAGNOSIS — E1165 Type 2 diabetes mellitus with hyperglycemia: Secondary | ICD-10-CM | POA: Diagnosis present

## 2015-09-23 DIAGNOSIS — Z7902 Long term (current) use of antithrombotics/antiplatelets: Secondary | ICD-10-CM | POA: Diagnosis not present

## 2015-09-23 DIAGNOSIS — R509 Fever, unspecified: Secondary | ICD-10-CM | POA: Diagnosis present

## 2015-09-23 DIAGNOSIS — R55 Syncope and collapse: Secondary | ICD-10-CM | POA: Diagnosis not present

## 2015-09-23 DIAGNOSIS — N390 Urinary tract infection, site not specified: Secondary | ICD-10-CM | POA: Diagnosis present

## 2015-09-23 DIAGNOSIS — Z923 Personal history of irradiation: Secondary | ICD-10-CM | POA: Diagnosis not present

## 2015-09-23 DIAGNOSIS — Z8572 Personal history of non-Hodgkin lymphomas: Secondary | ICD-10-CM | POA: Diagnosis not present

## 2015-09-23 DIAGNOSIS — Z8673 Personal history of transient ischemic attack (TIA), and cerebral infarction without residual deficits: Secondary | ICD-10-CM | POA: Diagnosis not present

## 2015-09-23 DIAGNOSIS — I10 Essential (primary) hypertension: Secondary | ICD-10-CM | POA: Diagnosis present

## 2015-09-23 DIAGNOSIS — IMO0002 Reserved for concepts with insufficient information to code with codable children: Secondary | ICD-10-CM | POA: Diagnosis present

## 2015-09-23 DIAGNOSIS — Z794 Long term (current) use of insulin: Secondary | ICD-10-CM | POA: Diagnosis not present

## 2015-09-23 DIAGNOSIS — R6510 Systemic inflammatory response syndrome (SIRS) of non-infectious origin without acute organ dysfunction: Secondary | ICD-10-CM | POA: Diagnosis present

## 2015-09-23 DIAGNOSIS — I480 Paroxysmal atrial fibrillation: Secondary | ICD-10-CM | POA: Diagnosis present

## 2015-09-23 DIAGNOSIS — Z87891 Personal history of nicotine dependence: Secondary | ICD-10-CM | POA: Diagnosis not present

## 2015-09-23 DIAGNOSIS — B9620 Unspecified Escherichia coli [E. coli] as the cause of diseases classified elsewhere: Secondary | ICD-10-CM | POA: Diagnosis present

## 2015-09-23 DIAGNOSIS — Z7982 Long term (current) use of aspirin: Secondary | ICD-10-CM | POA: Diagnosis not present

## 2015-09-23 DIAGNOSIS — R296 Repeated falls: Secondary | ICD-10-CM | POA: Diagnosis present

## 2015-09-23 DIAGNOSIS — R197 Diarrhea, unspecified: Secondary | ICD-10-CM | POA: Diagnosis present

## 2015-09-23 DIAGNOSIS — E118 Type 2 diabetes mellitus with unspecified complications: Secondary | ICD-10-CM | POA: Diagnosis not present

## 2015-09-23 LAB — BASIC METABOLIC PANEL
ANION GAP: 11 (ref 5–15)
BUN: 21 mg/dL — AB (ref 6–20)
CALCIUM: 8.9 mg/dL (ref 8.9–10.3)
CO2: 23 mmol/L (ref 22–32)
Chloride: 105 mmol/L (ref 101–111)
Creatinine, Ser: 0.82 mg/dL (ref 0.61–1.24)
GFR calc Af Amer: >60 mL/min (ref >60)
GLUCOSE: 189 mg/dL — AB (ref 65–99)
Potassium: 3.9 mmol/L (ref 3.5–5.1)
Sodium: 139 mmol/L (ref 135–145)

## 2015-09-23 LAB — CBC WITH DIFFERENTIAL/PLATELET
BASOS PCT: 0 %
Basophils Absolute: 0 10*3/uL (ref 0.0–0.1)
Eosinophils Absolute: 0 10*3/uL (ref 0.0–0.7)
Eosinophils Relative: 0 %
HEMATOCRIT: 39.1 % (ref 39.0–52.0)
Hemoglobin: 12.7 g/dL — ABNORMAL LOW (ref 13.0–17.0)
LYMPHS ABS: 1.1 10*3/uL (ref 0.7–4.0)
Lymphocytes Relative: 5 %
MCH: 28.9 pg (ref 26.0–34.0)
MCHC: 32.5 g/dL (ref 30.0–36.0)
MCV: 89.1 fL (ref 78.0–100.0)
MONO ABS: 1.2 10*3/uL — AB (ref 0.1–1.0)
MONOS PCT: 5 %
NEUTROS ABS: 19.9 10*3/uL — AB (ref 1.7–7.7)
Neutrophils Relative %: 90 %
Platelets: 172 10*3/uL (ref 150–400)
RBC: 4.39 MIL/uL (ref 4.22–5.81)
RDW: 13.7 % (ref 11.5–15.5)
WBC: 22.2 10*3/uL — ABNORMAL HIGH (ref 4.0–10.5)

## 2015-09-23 LAB — I-STAT CG4 LACTIC ACID, ED
LACTIC ACID, VENOUS: 0.93 mmol/L (ref 0.5–2.0)
Lactic Acid, Venous: 2.29 mmol/L (ref 0.5–2.0)

## 2015-09-23 LAB — URINALYSIS, ROUTINE W REFLEX MICROSCOPIC
Bilirubin Urine: NEGATIVE
GLUCOSE, UA: 250 mg/dL — AB
HGB URINE DIPSTICK: NEGATIVE
KETONES UR: NEGATIVE mg/dL
Nitrite: NEGATIVE
PROTEIN: NEGATIVE mg/dL
Specific Gravity, Urine: 1.012 (ref 1.005–1.030)
pH: 7 (ref 5.0–8.0)

## 2015-09-23 LAB — COMPREHENSIVE METABOLIC PANEL
ALBUMIN: 2.6 g/dL — AB (ref 3.5–5.0)
ALT: 53 U/L (ref 17–63)
ANION GAP: 13 (ref 5–15)
AST: 82 U/L — ABNORMAL HIGH (ref 15–41)
Alkaline Phosphatase: 58 U/L (ref 38–126)
BILIRUBIN TOTAL: 0.8 mg/dL (ref 0.3–1.2)
BUN: 20 mg/dL (ref 6–20)
CO2: 24 mmol/L (ref 22–32)
Calcium: 8.3 mg/dL — ABNORMAL LOW (ref 8.9–10.3)
Chloride: 105 mmol/L (ref 101–111)
Creatinine, Ser: 0.8 mg/dL (ref 0.61–1.24)
Glucose, Bld: 142 mg/dL — ABNORMAL HIGH (ref 65–99)
POTASSIUM: 4.2 mmol/L (ref 3.5–5.1)
Sodium: 142 mmol/L (ref 135–145)
TOTAL PROTEIN: 5.6 g/dL — AB (ref 6.5–8.1)

## 2015-09-23 LAB — CREATININE, SERUM: Creatinine, Ser: 0.85 mg/dL (ref 0.61–1.24)

## 2015-09-23 LAB — CBC
HEMATOCRIT: 39.9 % (ref 39.0–52.0)
HEMOGLOBIN: 13.6 g/dL (ref 13.0–17.0)
MCH: 30.1 pg (ref 26.0–34.0)
MCHC: 34.1 g/dL (ref 30.0–36.0)
MCV: 88.3 fL (ref 78.0–100.0)
Platelets: 180 10*3/uL (ref 150–400)
RBC: 4.52 MIL/uL (ref 4.22–5.81)
RDW: 13.5 % (ref 11.5–15.5)
WBC: 22.3 10*3/uL — ABNORMAL HIGH (ref 4.0–10.5)

## 2015-09-23 LAB — URINE MICROSCOPIC-ADD ON

## 2015-09-23 LAB — PROCALCITONIN: PROCALCITONIN: 2.69 ng/mL

## 2015-09-23 LAB — GLUCOSE, CAPILLARY
GLUCOSE-CAPILLARY: 202 mg/dL — AB (ref 65–99)
GLUCOSE-CAPILLARY: 212 mg/dL — AB (ref 65–99)
GLUCOSE-CAPILLARY: 194 mg/dL — AB (ref 65–99)
Glucose-Capillary: 124 mg/dL — ABNORMAL HIGH (ref 65–99)

## 2015-09-23 LAB — LACTIC ACID, PLASMA: LACTIC ACID, VENOUS: 1.7 mmol/L (ref 0.5–2.0)

## 2015-09-23 LAB — INFLUENZA PANEL BY PCR (TYPE A & B)
H1N1 flu by pcr: NOT DETECTED
INFLAPCR: NEGATIVE
Influenza B By PCR: NEGATIVE

## 2015-09-23 LAB — CBG MONITORING, ED
GLUCOSE-CAPILLARY: 118 mg/dL — AB (ref 65–99)
Glucose-Capillary: 121 mg/dL — ABNORMAL HIGH (ref 65–99)

## 2015-09-23 MED ORDER — ENOXAPARIN SODIUM 40 MG/0.4ML ~~LOC~~ SOLN
40.0000 mg | SUBCUTANEOUS | Status: DC
  Administered 2015-09-23 – 2015-09-24 (×2): 40 mg via SUBCUTANEOUS
  Filled 2015-09-23 (×3): qty 0.4

## 2015-09-23 MED ORDER — METOPROLOL TARTRATE 25 MG PO TABS
25.0000 mg | ORAL_TABLET | Freq: Two times a day (BID) | ORAL | Status: DC
  Administered 2015-09-23 – 2015-09-25 (×5): 25 mg via ORAL
  Filled 2015-09-23 (×5): qty 1

## 2015-09-23 MED ORDER — METRONIDAZOLE IN NACL 5-0.79 MG/ML-% IV SOLN
500.0000 mg | Freq: Three times a day (TID) | INTRAVENOUS | Status: DC
  Administered 2015-09-23 – 2015-09-25 (×5): 500 mg via INTRAVENOUS
  Filled 2015-09-23 (×5): qty 100

## 2015-09-23 MED ORDER — SODIUM CHLORIDE 0.9 % IV BOLUS (SEPSIS)
500.0000 mL | INTRAVENOUS | Status: AC
  Administered 2015-09-23: 500 mL via INTRAVENOUS

## 2015-09-23 MED ORDER — CEFTRIAXONE SODIUM 1 G IJ SOLR
1.0000 g | INTRAMUSCULAR | Status: DC
  Administered 2015-09-24 – 2015-09-25 (×2): 1 g via INTRAVENOUS
  Filled 2015-09-23 (×3): qty 10

## 2015-09-23 MED ORDER — SIMVASTATIN 20 MG PO TABS
20.0000 mg | ORAL_TABLET | Freq: Every day | ORAL | Status: DC
  Administered 2015-09-23 – 2015-09-24 (×2): 20 mg via ORAL
  Filled 2015-09-23 (×2): qty 1

## 2015-09-23 MED ORDER — TAMSULOSIN HCL 0.4 MG PO CAPS
0.8000 mg | ORAL_CAPSULE | Freq: Every day | ORAL | Status: DC
  Administered 2015-09-23 – 2015-09-25 (×3): 0.8 mg via ORAL
  Filled 2015-09-23 (×4): qty 2

## 2015-09-23 MED ORDER — SODIUM CHLORIDE 0.9 % IV BOLUS (SEPSIS)
1000.0000 mL | Freq: Once | INTRAVENOUS | Status: AC
  Administered 2015-09-23: 1000 mL via INTRAVENOUS

## 2015-09-23 MED ORDER — INSULIN ASPART 100 UNIT/ML ~~LOC~~ SOLN
0.0000 [IU] | Freq: Three times a day (TID) | SUBCUTANEOUS | Status: DC
  Administered 2015-09-23 – 2015-09-24 (×2): 3 [IU] via SUBCUTANEOUS
  Administered 2015-09-24: 2 [IU] via SUBCUTANEOUS
  Administered 2015-09-25: 1 [IU] via SUBCUTANEOUS

## 2015-09-23 MED ORDER — SODIUM CHLORIDE 0.9 % IV BOLUS (SEPSIS)
1000.0000 mL | INTRAVENOUS | Status: AC
  Administered 2015-09-23: 1000 mL via INTRAVENOUS

## 2015-09-23 MED ORDER — INSULIN GLARGINE 100 UNIT/ML ~~LOC~~ SOLN
25.0000 [IU] | Freq: Every day | SUBCUTANEOUS | Status: DC
  Administered 2015-09-23 – 2015-09-25 (×3): 25 [IU] via SUBCUTANEOUS
  Filled 2015-09-23 (×4): qty 0.25

## 2015-09-23 MED ORDER — SODIUM CHLORIDE 0.9 % IV SOLN
INTRAVENOUS | Status: AC
  Administered 2015-09-23 – 2015-09-24 (×2): via INTRAVENOUS

## 2015-09-23 MED ORDER — DEXTROSE 5 % IV SOLN
2.0000 g | INTRAVENOUS | Status: DC
  Filled 2015-09-23: qty 2

## 2015-09-23 MED ORDER — CLOPIDOGREL BISULFATE 75 MG PO TABS
75.0000 mg | ORAL_TABLET | Freq: Every day | ORAL | Status: DC
  Administered 2015-09-23 – 2015-09-25 (×3): 75 mg via ORAL
  Filled 2015-09-23 (×3): qty 1

## 2015-09-23 MED ORDER — CEFTRIAXONE SODIUM 2 G IJ SOLR
2.0000 g | Freq: Once | INTRAMUSCULAR | Status: AC
  Administered 2015-09-23: 2 g via INTRAVENOUS
  Filled 2015-09-23: qty 2

## 2015-09-23 MED ORDER — ASPIRIN EC 81 MG PO TBEC
81.0000 mg | DELAYED_RELEASE_TABLET | Freq: Every day | ORAL | Status: DC
  Administered 2015-09-23 – 2015-09-25 (×3): 81 mg via ORAL
  Filled 2015-09-23 (×4): qty 1

## 2015-09-23 NOTE — ED Notes (Signed)
Pt had one episode of vomiting and urinary incontinence. Pt cleaned and all linens changed. NAD at this time.

## 2015-09-23 NOTE — Progress Notes (Addendum)
Pt seen and examined, admitted this am by Dr.K 74/M vague historian, presented to the ER after a fall, reports some chronic diarrhea, also reports low grade fevers at home Continue Ceftriaxone, although suspicion low, FU cultures, stop this if negative CXR/flu PCR negative FU Cdiff Ag/toxin Pt eval, further management as condition evolves, Orthostatics negative  Domenic Polite, MD

## 2015-09-23 NOTE — H&P (Signed)
[facility] History and Physical  [name] [id number] DOB: [birth year] DOA: 09/22/2015  Referring physician: Dr.Horton. PCP: Donnie Coffin, MD  Specialists:Dr.Ha  Chief Complaint: Fall.  HPI: [name] is a 75 y.o. male history of paroxysmal atrial fibrillation, diabetes mellitus type 2, history of stroke, lymphoma in remission who was admitted in February for falls and eventually discharged to rehabilitation and at that time patient had UTI was brought to the ER after patient had a fall last night at home. Patient states initially had some fevers and chills before going to bed at 9 PM. Later on he woke up around 9:30 to go to the bathroom when he suddenly fell but did not lose consciousness. Since he was discharged from rehabilitation he has had at least 3 falls. He has not loss consciousness in any of the falls. He is supposed to use a walker which she did not use last night. Yesterday morning he had a bowel movement which was loose and diarrhea and he had used Imodium. Patient has been having diarrhea off and on last week. Denies any chest pain palpitations nausea vomiting abdominal pain. In the ER patient was found be febrile with elevated lactic acid and leukocytosis. Blood cultures were obtained. They show some nitrites. Patient was empirically started on ceftriaxone for possible UTI and influenza PCR has been negative.   Review of Systems: As presented in the history of presenting illness, rest negative.  Past Medical History  Diagnosis Date  . 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 Hypertension   . Diabetes mellitus   . A-fib ([place])   . Cataract   . Charcot-Marie-Tooth disease   . 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 Stroke [facility])    History reviewed. No pertinent past surgical history. Social History:  reports that he quit smoking about 24 years ago. He quit smokeless tobacco use about 25 years ago. He reports that he  does not drink alcohol or use illicit drugs. Where does patient live home.  Can patient participate in ADLs? Yes. Allergies  Allergen Reactions  . Actos [Pioglitazone Hydrochloride] Other (See Comments)    Afib  . Hydrochlorothiazide Other (See Comments)    Lower potassium to low  . Penicillins Rash    Tolerated cefepime, ceftriaxone    Family History:  Family History  Problem Relation Age of Onset  . Hypertension Mother   . Hypertension Father       Prior to Admission medications   Medication Sig Start Date End Date Taking? Authorizing Provider  aspirin EC 81 MG EC tablet Take 1 tablet (81 mg total) by mouth daily. Patient taking differently: Take 81 mg by mouth 3 (three) times a week. Monday, Wednesday and Friday 08/19/15  Yes Florencia Reasons, MD  clopidogrel (PLAVIX) 75 MG tablet Take 75 mg by mouth daily.   Yes Historical Provider, MD  insulin glargine (LANTUS) 100 UNIT/ML injection Inject 25 Units into the skin daily.    Yes Historical Provider, MD  loperamide (IMODIUM) 2 MG capsule Take 2 mg by mouth daily as needed for diarrhea or loose stools.   Yes Historical Provider, MD  metFORMIN (GLUCOPHAGE) 500 MG tablet Take 500 mg by mouth daily with breakfast.  05/10/13  Yes Historical Provider, MD  metoprolol tartrate (LOPRESSOR) 25 MG tablet Take 0.5 tablets (12.5 mg total) by mouth 2 (two) times daily. Patient taking differently: Take 25 mg by mouth 2 (two) times daily.  08/19/15  Yes Florencia Reasons, MD  saccharomyces boulardii (FLORASTOR) 250 MG capsule Take 1 capsule (250 mg total) by mouth 2 (two) times daily. Patient taking differently: Take 250 mg by mouth 2 (two) times daily as needed (stomache).  09/18/13  Yes Thurnell Lose, MD  simvastatin (ZOCOR) 20 MG tablet Take 20 mg by mouth at bedtime.     Yes Historical Provider, MD  tamsulosin (FLOMAX) 0.4 MG CAPS capsule Take 2 capsules (0.8 mg total) by mouth daily. 08/19/15  Yes Florencia Reasons, MD    Physical Exam: Filed Vitals:   09/23/15 0149  09/23/15 0200 09/23/15 0345 09/23/15 0625  BP:  107/53 112/52 114/49  Pulse: 79 77 75 72  Temp:    97.7 F (36.5 C)  TempSrc:    Oral  Resp: 16 17 16 18   SpO2: 96% 97% 98% 99%     General:  Moderately built and nourished.  Eyes: Anicteric no pallor.  ENT: No discharge from the ears eyes nose and mouth.  Neck: No mass felt. No neck rigidity. No JVD appreciated.  Cardiovascular: S1 and S2 heard.  Respiratory: No rhonchi or crepitations.  Abdomen: Soft nontender bowel sounds present.  Skin: Skin bruises on the knee.  Musculoskeletal: No edema.  Psychiatric: Appears normal.  Neurologic: Alert awake oriented to time place and person. Moves all extremities 5 x 5.  Labs on Admission:  Basic Metabolic Panel:  Recent Labs Lab 09/22/15 2355  NA 139  K 3.9  CL 105  CO2 23  GLUCOSE 189*  BUN 21*  CREATININE 0.82  CALCIUM 8.9   Liver Function Tests: No results for input(s): AST, ALT, ALKPHOS, BILITOT, PROT, ALBUMIN in the last 168 hours. No results for input(s): LIPASE, AMYLASE in the last 168 hours. No results for input(s): AMMONIA in the last 168 hours. CBC:  Recent Labs Lab 09/22/15 2355  WBC 22.3*  HGB 13.6  HCT 39.9  MCV 88.3  PLT 180   Cardiac Enzymes: No results for input(s): CKTOTAL, CKMB, CKMBINDEX, TROPONINI in the last 168 hours.  BNP (last 3 results) No results for input(s): BNP in the last 8760 hours.  ProBNP (last 3 results) No results for input(s): PROBNP in the last 8760 hours.  CBG:  Recent Labs Lab 09/22/15 2350  GLUCAP 180*    Radiological Exams on Admission: Dg Chest 2 View  09/23/2015  CLINICAL DATA:  Leukocytosis EXAM: CHEST  2 VIEW COMPARISON:  05/29/2014 FINDINGS: Cardiac shadow is within normal limits. The lungs are well aerated bilaterally. No focal infiltrate or sizable effusion is seen. No acute bony abnormality is noted. IMPRESSION: No active cardiopulmonary disease. Electronically Signed   By: Inez Catalina M.D.   On:  09/23/2015 01:47   Ct Head Wo Contrast  09/23/2015  CLINICAL DATA:  Weakness EXAM: CT HEAD WITHOUT CONTRAST TECHNIQUE: Contiguous axial images were obtained from the base of the skull through the vertex without intravenous contrast. COMPARISON:  08/15/2015 FINDINGS: Bony calvarium is intact. Mild atrophic changes are seen. Mild chronic white matter ischemic changes noted. No findings to suggest acute hemorrhage, acute infarction or space-occupying mass lesion are noted. 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 IMPRESSION: Chronic atrophic and ischemic changes without acute abnormality. Electronically Signed   By: Inez Catalina M.D.   On: 09/23/2015 03:05    EKG: Independently reviewed. Sinus rhythm with RBBB.  Assessment/Plan Principal Problem:   SIRS (systemic inflammatory response syndrome) (HCC) Active Problems:   Recurrent falls   Fever  Near syncope   PAF (paroxysmal atrial fibrillation) (HCC)   Diabetes mellitus type 2, uncontrolled ([place])   1. SIRS - source not clear. At this time patient is empirically placed on ceftriaxone for UTI since patient's urine does show some nitrites. Patient also had some diarrhea for which we will check stool for C. difficile until I'll keep patient on Flagyl. Continue with gentle hydration. Recheck lactic acid level checked for calcitonin levels follow cultures. 2. Near syncope/recurrent falls - closely following telemetry for any arrhythmias check orthostatic vital signs as patient has been orthostatic during last admission. Gently hydrate. Recheck lactic acid levels. 3. Paroxysmal atrial fibrillation presently in sinus rhythm - patient's chads 2 vasc score is more than 2 but patient has declined to use any anticoagulation with patient's own wishes despite knowing the increased risk of stroke. Patient wants to continue on Plavix and aspirin. 4. Diabetes mellitus type 2 - continue Lantus with sliding scale coverage. Hold metformin while inpatient and also  due to elevated lactic acid levels. 5. History of lymphoma in remission - note that patient does have leukocytosis. Will follow CBC.  X-ray of the pelvis and right knee are pending.   DVT Prophylaxis Lovenox.  Code Status: Full code.  Family Communication: Patient's daughter.  Disposition Plan: Admit to inpatient.    Giang Hemme N. [facility] Pager [phone].  If 7PM-7AM, please contact night-coverage [email or web address] Password [facility] 09/23/2015, 6:28 AM

## 2015-09-23 NOTE — Progress Notes (Signed)
Pharmacy Antibiotic Note  [name] is a 75 y.o. male admitted on 09/22/2015 with sepsis and UTI.  Pharmacy has been consulted for Ceftriaxone dosing WBC elevated, lactic acid elevated.   Plan: -Ceftriaxone 2g IV q24h -F/U urine culture for directed therapy   Temp (24hrs), Avg:100 F (37.8 C), Min:98.6 F (37 C), Max:101.3 F (38.5 C)   Recent Labs Lab 09/22/15 2355 09/23/15 0020  WBC 22.3*  --   CREATININE 0.82  --   LATICACIDVEN  --  2.29*    CrCl cannot be calculated (Unknown ideal weight.).    Allergies  Allergen Reactions  . Actos [Pioglitazone Hydrochloride] Other (See Comments)    Afib  . Hydrochlorothiazide Other (See Comments)    Lower potassium to low  . Penicillins Rash    Tolerated cefepime, ceftriaxone    Narda Bonds 09/23/2015 3:29 AM

## 2015-09-23 NOTE — Progress Notes (Signed)
Per Judeen Hammans in [place], wait one hour for room to do complete air exchange before cleaning.

## 2015-09-23 NOTE — ED Notes (Signed)
ACTIVATED CODE SEPSIS  

## 2015-09-23 NOTE — Evaluation (Signed)
Physical Therapy Evaluation Patient Details Name: [name] MRN: [id number] DOB: 1940/07/07 Today's Date: 09/23/2015   History of Present Illness  [name] brought in by EMS is a 75 y.o. male with a PMHx of HTN, DM, Stroke-R sided deficits, A-Fib, and B-cell lymphoma who presents after a fall  Clinical Impression  Pt admitted with above complications. Pt currently with functional limitations due to the deficits listed below (see PT Problem List). Demonstrates instability with gait, requiring min assist for balance including transfers. 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 Family should ultimately consider ALF as a longer term option for patient safety in the future. He has fallen multiple times bout inside and outside of his house.  Pt will benefit from skilled PT to increase their independence and safety with mobility to allow discharge to the venue listed below.       Follow Up Recommendations SNF;Supervision for mobility/OOB (Would GREATLY benefit from ALF after rehab) - Patient may refuse SNF as he just left one recently, if so - consider HHPT and potentially an aide.    Equipment Recommendations  None recommended by PT    Recommendations for Other Services OT consult     Precautions / Restrictions Precautions Precautions: Fall Restrictions Weight Bearing Restrictions: No      Mobility  Bed Mobility Overal bed mobility: Needs Assistance Bed Mobility: Supine to Sit;Sit to Supine     Supine to sit: Min guard Sit to supine: Min assist   General bed mobility comments: Min guard to rise to EOB, very effortful with use of rail needed, cus for sequencing. Min assist for LEs back into bed.  Transfers Overall transfer level: Needs assistance Equipment used: Rolling walker (2  wheeled) Transfers: Sit to/from Stand Sit to Stand: Min assist         General transfer comment: Min assist for balance when rising from bed, with posterior lean and LOB. Also attempted without RW demonstrating further instability requiring assist for balance. Cues for awareness and hand placement.  Ambulation/Gait Ambulation/Gait assistance: Min assist Ambulation Distance (Feet): 75 Feet Assistive device: Rolling walker (2 wheeled) Gait Pattern/deviations: Step-through pattern;Decreased stride length;Shuffle;Staggering left;Staggering right;Leaning posteriorly Gait velocity: decreased Gait velocity interpretation: <1.8 ft/sec, indicative of risk for recurrent falls General Gait Details: Shuffled steps without heel strike, fairly stable when attempting straight path however when static or turning pt looses balance requiring min assist to correct. VC for improved foot clearance, compensate due to decreased dorsiflexion ROM. VC for upright posture and forward gaze. Instability not exacerbated by head turns vertical/horiztonal although he is holding a RW at this time. Reports balance is worse with head turns at home since a previous stroke.  Stairs            Wheelchair Mobility    Modified Rankin (Stroke Patients Only)       Balance Overall balance assessment: Needs assistance;History of Falls Sitting-balance support: No upper extremity supported;Feet supported Sitting balance-Leahy Scale: Good     Standing balance support: No upper extremity supported Standing balance-Leahy Scale: Fair Standing balance comment: Tolerates eyes open, wide BOS no UE support for 30 sec. Feet wide apart with eyes closed cannot tolerate.                             Pertinent Vitals/Pain Pain  Assessment: No/denies pain    Home Living Family/patient expects to be discharged to:: Unsure Living Arrangements: Alone Available Help at Discharge: Family;Available PRN/intermittently Type  of Home: House Home Access: Ramped entrance     Home Layout: One level Home Equipment: Walker - 2 wheels;Wheelchair - manual;Cane - single point;Cane - quad;Walker - standard;Bedside commode      Prior Function Level of Independence: Independent with assistive device(s)         Comments: States he uses a rolling walker and furniture walks. 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 Does not reach neutral with dorsiflexion. 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 Does not reach neutral with dorsiflexion.     Communication   Communication: HOH  Cognition Arousal/Alertness: Awake/alert Behavior During Therapy: WFL for tasks assessed/performed Overall Cognitive Status: Within Functional Limits for tasks assessed                      General Comments General comments (skin integrity, edema, etc.): States he needs a male urinal due to anatomy. Have asked secretary to order for unit, RN notified.    Exercises Other Exercises Other Exercises: Hamstring stretch in seated position. Requires assist      Assessment/Plan    PT Assessment Patient needs continued PT services  PT Diagnosis Difficulty walking;Abnormality of gait   PT Problem List Decreased strength;Decreased range of motion;Decreased balance;Decreased activity tolerance;Decreased mobility;Decreased knowledge of use of DME;Impaired sensation  PT Treatment Interventions DME instruction;Gait training;Functional mobility training;Therapeutic activities;Balance training;Therapeutic exercise;Neuromuscular re-education;Patient/family  education   PT Goals (Current goals can be found in the Care Plan section) Acute Rehab PT Goals Patient Stated Goal: Not stated PT Goal Formulation: With patient Time For Goal Achievement: 10/07/15 Potential to Achieve Goals: Good    Frequency Min 3X/week   Barriers to discharge Decreased caregiver support lives alone    Co-evaluation               End of Session Equipment Utilized During Treatment: Gait belt Activity Tolerance: Patient tolerated treatment well Patient left: in bed;with call bell/phone within reach;with bed alarm set;with nursing/sitter in room Nurse Communication: Mobility status;Other (comment) (Needs male urinal)         Time: [id number] PT Time Calculation (min) (ACUTE ONLY): 24 min   Charges:   PT Evaluation $PT Eval Moderate Complexity: 1 Procedure PT Treatments $Gait Training: 8-22 mins   PT G Codes:        Ellouise Newer 09/23/2015, 5:32 PM  Camille Bal Delta, [place]

## 2015-09-23 NOTE — ED Notes (Signed)
Reported attempted, Nurse and Charge nurse at lunch will not be back for about 20 minutes

## 2015-09-23 NOTE — Progress Notes (Signed)
Called ED for report spoke with [place].

## 2015-09-24 DIAGNOSIS — Z794 Long term (current) use of insulin: Secondary | ICD-10-CM

## 2015-09-24 DIAGNOSIS — E118 Type 2 diabetes mellitus with unspecified complications: Secondary | ICD-10-CM

## 2015-09-24 DIAGNOSIS — R6510 Systemic inflammatory response syndrome (SIRS) of non-infectious origin without acute organ dysfunction: Secondary | ICD-10-CM

## 2015-09-24 DIAGNOSIS — I480 Paroxysmal atrial fibrillation: Secondary | ICD-10-CM

## 2015-09-24 DIAGNOSIS — E1165 Type 2 diabetes mellitus with hyperglycemia: Secondary | ICD-10-CM

## 2015-09-24 LAB — CBC
HEMATOCRIT: 33.9 % — AB (ref 39.0–52.0)
HEMOGLOBIN: 11.5 g/dL — AB (ref 13.0–17.0)
MCH: 30.2 pg (ref 26.0–34.0)
MCHC: 33.9 g/dL (ref 30.0–36.0)
MCV: 89 fL (ref 78.0–100.0)
Platelets: 129 10*3/uL — ABNORMAL LOW (ref 150–400)
RBC: 3.81 MIL/uL — AB (ref 4.22–5.81)
RDW: 14.1 % (ref 11.5–15.5)
WBC: 14.7 10*3/uL — ABNORMAL HIGH (ref 4.0–10.5)

## 2015-09-24 LAB — GLUCOSE, CAPILLARY
GLUCOSE-CAPILLARY: 102 mg/dL — AB (ref 65–99)
GLUCOSE-CAPILLARY: 184 mg/dL — AB (ref 65–99)
Glucose-Capillary: 205 mg/dL — ABNORMAL HIGH (ref 65–99)
Glucose-Capillary: 229 mg/dL — ABNORMAL HIGH (ref 65–99)

## 2015-09-24 LAB — BASIC METABOLIC PANEL
Anion gap: 10 (ref 5–15)
BUN: 21 mg/dL — AB (ref 6–20)
CALCIUM: 7.7 mg/dL — AB (ref 8.9–10.3)
CO2: 23 mmol/L (ref 22–32)
CREATININE: 0.72 mg/dL (ref 0.61–1.24)
Chloride: 106 mmol/L (ref 101–111)
GFR calc Af Amer: >60 mL/min (ref >60)
GFR calc non Af Amer: >60 mL/min (ref >60)
Glucose, Bld: 208 mg/dL — ABNORMAL HIGH (ref 65–99)
Potassium: 3.9 mmol/L (ref 3.5–5.1)
Sodium: 139 mmol/L (ref 135–145)

## 2015-09-24 NOTE — Progress Notes (Signed)
[facility] PROGRESS NOTE  [name] [id number] DOB: Mar 21, 1941 DOA: 09/22/2015 PCP: Donnie Coffin, MD Interim summary: [name] is a 75 y.o. male history of paroxysmal atrial fibrillation, diabetes mellitus type 2, history of stroke, lymphoma in remission , recently admitted and discharged to SNF presents with recurrent falls associated with loose diarrhea and possible recurrent UTI.   Assessment/Plan: 1. Diarrhea with recurrent falls: - diarrhea appears to have resolved, will get repeat orthostatic vital signs.  - PT eval recommended SNF.  - Hydrate, and plan to d/c flagyl in the next 24 hours.  - no stool sample yet to check for c diff.   2 PAF: - currently on aspirin and plavix.   3. Diabetes Mellitus: CBG (last 3)   Recent Labs  09/23/15 2148 09/24/15 0808 09/24/15 1108  GLUCAP 194* 102* 184*    Resume SSI.   4. Recurrent UTI: Cultures are pending. And resume rocephin tillwe get the cultures back.     Code Status: full code.  Family Communication: none at bedside Disposition Plan: pending further work up.    Consultants:  NONE  Procedures:  NONE  Antibiotics:  Flagyl   Rocephin for UTI  HPI/Subjective: No diarrhea all day. Still feeling very weak.   Objective: Filed Vitals:   09/24/15 0516 09/24/15 0804  BP: 101/41 117/38  Pulse: 63 69  Temp: 98 F (36.7 C) 97.8 F (36.6 C)  Resp: 20 19    Intake/Output Summary (Last 24 hours) at 09/24/15 1341 Last data filed at 09/24/15 1200  Gross per 24 hour  Intake 2606.25 ml  Output    225 ml  Net 2381.25 ml   Filed Weights   09/23/15 2150  Weight: 87.227 kg (192 lb 4.8 oz)    Exam:   General:  Alert comfortable  Cardiovascular: s1s2, RRR,   Respiratory: clear to auscultation, no wheezing or rhonchi  Abdomen: soft non tender non distended bowel sounds heard  Musculoskeletal: no pedal edema.   Data Reviewed: Basic Metabolic Panel:  Recent Labs Lab  09/22/15 2355 09/23/15 1056 09/23/15 1359 09/24/15 0002  NA 139  --  142 139  K 3.9  --  4.2 3.9  CL 105  --  105 106  CO2 23  --  24 23  GLUCOSE 189*  --  142* 208*  BUN 21*  --  20 21*  CREATININE 0.82 0.85 0.80 0.72  CALCIUM 8.9  --  8.3* 7.7*   Liver Function Tests:  Recent Labs Lab 09/23/15 1359  AST 82*  ALT 53  ALKPHOS 58  BILITOT 0.8  PROT 5.6*  ALBUMIN 2.6*   No results for input(s): LIPASE, AMYLASE in the last 168 hours. No results for input(s): AMMONIA in the last 168 hours. CBC:  Recent Labs Lab 09/22/15 2355 09/23/15 1359 09/24/15 0002  WBC 22.3* 22.2* 14.7*  NEUTROABS  --  19.9*  --   HGB 13.6 12.7* 11.5*  HCT 39.9 39.1 33.9*  MCV 88.3 89.1 89.0  PLT 180 172 129*   Cardiac Enzymes: No results for input(s): CKTOTAL, CKMB, CKMBINDEX, TROPONINI in the last 168 hours. BNP (last 3 results) No results for input(s): BNP in the last 8760 hours.  ProBNP (last 3 results) No results for input(s): PROBNP in the last 8760 hours.  CBG:  Recent Labs Lab 09/23/15 1636 09/23/15 1655 09/23/15 2148 09/24/15 0808 09/24/15 1108  GLUCAP 212* 202* 194* 102* 184*    No results found for this or any previous visit (  from the past 240 hour(s)).   Studies: Dg Chest 2 View  09/23/2015  CLINICAL DATA:  Leukocytosis EXAM: CHEST  2 VIEW COMPARISON:  05/29/2014 FINDINGS: Cardiac shadow is within normal limits. The lungs are well aerated bilaterally. No focal infiltrate or sizable effusion is seen. No acute bony abnormality is noted. IMPRESSION: No active cardiopulmonary disease. Electronically Signed   By: Inez Catalina M.D.   On: 09/23/2015 01:47   Dg Pelvis 1-2 Views  09/23/2015  CLINICAL DATA:  Fall in February and last night. EXAM: PELVIS - 1-2 VIEW COMPARISON:  None. FINDINGS: Mild symmetric degenerative changes in the hips bilaterally. SI joints are symmetric and unremarkable. No acute bony abnormality. Specifically, no fracture, subluxation, or dislocation. Soft  tissues are intact. IMPRESSION: No acute bony abnormality. Electronically Signed   By: Rolm Baptise M.D.   On: 09/23/2015 10:35   Ct Head Wo Contrast  09/23/2015  CLINICAL DATA:  Weakness EXAM: CT HEAD WITHOUT CONTRAST TECHNIQUE: Contiguous axial images were obtained from the base of the skull through the vertex without intravenous contrast. COMPARISON:  08/15/2015 FINDINGS: Bony calvarium is intact. Mild atrophic changes are seen. Mild chronic white matter ischemic changes noted. No findings to suggest acute hemorrhage, acute infarction or space-occupying mass lesion are noted. 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 IMPRESSION: Chronic atrophic and ischemic changes without acute abnormality. Electronically Signed   By: Inez Catalina M.D.   On: 09/23/2015 03:05   Dg Knee Complete 4 Views Right  09/23/2015  CLINICAL DATA:  Fall last night. Abrasions and bruising over anterior right knee EXAM: RIGHT KNEE - COMPLETE 4+ VIEW COMPARISON:  None. FINDINGS: No acute bony abnormality. Specifically, no fracture, subluxation, or dislocation. Soft tissues are intact. No joint effusion. Vascular calcifications noted. IMPRESSION: No acute bony abnormality. Electronically Signed   By: Rolm Baptise M.D.   On: 09/23/2015 10:36    Scheduled Meds: . aspirin EC  81 mg Oral Daily  . cefTRIAXone (ROCEPHIN)  IV  1 g Intravenous Q24H  . clopidogrel  75 mg Oral Daily  . enoxaparin (LOVENOX) injection  40 mg Subcutaneous Q24H  . insulin aspart  0-9 Units Subcutaneous TID WC  . insulin glargine  25 Units Subcutaneous Daily  . metoprolol tartrate  25 mg Oral BID  . metronidazole  500 mg Intravenous Q8H  . simvastatin  20 mg Oral QHS  . tamsulosin  0.8 mg Oral Daily   Continuous Infusions:   Principal Problem:   SIRS (systemic inflammatory response syndrome) (HCC) Active Problems:   Recurrent falls   Fever   Near syncope   PAF (paroxysmal atrial fibrillation) (HCC)   Diabetes mellitus type 2, uncontrolled  ([place])    Time spent: 35 minutes    [facility] Pager [phone] If 7PM-7AM, please contact night-coverage at [email or web address], password [facility] 09/24/2015, 1:41 PM  LOS: 1 day

## 2015-09-24 NOTE — Progress Notes (Signed)
Physical Therapy Treatment Patient Details Name: [name] MRN: [id number] DOB: 28-Jul-1940 Today's Date: 09/24/2015    History of Present Illness [name] brought in by EMS is a 75 y.o. male with a PMHx of HTN, DM, Stroke-R sided deficits, A-Fib, and B-cell lymphoma who presents after a fall    PT Comments    Pt progressing well but remains to require assist for safe transfers, ambulation, and ADLs. Pt con't to benefit from ST-SNF to achieve safe mod I level of function. Pt to strongly benefit from ALF upon rehab stay.  Follow Up Recommendations  SNF;Supervision for mobility/OOB     Equipment Recommendations  None recommended by PT    Recommendations for Other Services       Precautions / Restrictions Precautions Precautions: Fall Restrictions Weight Bearing Restrictions: No    Mobility  Bed Mobility               General bed mobility comments: pt up in chair  Transfers Overall transfer level: Needs assistance Equipment used: Rolling walker (2 wheeled) Transfers: Sit to/from Stand Sit to Stand: Min assist;Mod assist         General transfer comment: required rocking momentum. reports "i hurt my R shoulder this morning pulling myself up in bed"  Ambulation/Gait Ambulation/Gait assistance: Min assist Ambulation Distance (Feet): 75 Feet Assistive device: Rolling walker (2 wheeled) Gait Pattern/deviations: Step-through pattern;Decreased stride length;Trunk flexed (decreased step height) Gait velocity: decreased Gait velocity interpretation: Below normal speed for age/gender General Gait Details: no heel strike, shuffled steps, v/c's to look forward. no seated rest break needed, guarded during turns   Financial trader Rankin (Stroke Patients Only)       Balance               Standing balance comment: requires RW for amb                    Cognition Arousal/Alertness:  Awake/alert Behavior During Therapy: WFL for tasks assessed/performed Overall Cognitive Status: Within Functional Limits for tasks assessed                      Exercises      General Comments General comments (skin integrity, edema, etc.): discussed d/c planning due to pt unsafe to be home alone      Pertinent Vitals/Pain Pain Assessment: No/denies pain    Home Living                      Prior Function            PT Goals (current goals can now be found in the care plan section) Acute Rehab PT Goals Patient Stated Goal: "i'd like to go home" Progress towards PT goals: Progressing toward goals    Frequency  Min 3X/week    PT Plan Current plan remains appropriate    Co-evaluation             End of Session Equipment Utilized During Treatment: Gait belt Activity Tolerance: Patient tolerated treatment well Patient left: in chair;with call bell/phone within reach     Time: 1500-1517 PT Time Calculation (min) (ACUTE ONLY): 17 min  Charges:  $Gait Training: 8-22 mins                    G Codes:      Arabel Barcenas,  Knute Neu 09/24/2015, 4:51 PM   Kittie Plater, PT, DPT Pager #: [phone] Office #: [phone]

## 2015-09-24 NOTE — Plan of Care (Signed)
Problem: Safety: Goal: Ability to remain free from injury will improve Outcome: Progressing Patient remains free from falls, agrees to call out before getting out of bed.

## 2015-09-24 NOTE — Clinical Social Work Note (Signed)
Patient requesting to work with PT for a second time to determine whether patient feels comfortable returning home with home health services or admitting to SNF. Patient requests CSW follow-up with patient on 09/25/2015.  Full assessment to follow.  Lubertha Sayres, [facility]: [phone] Surgical: [phone]

## 2015-09-25 LAB — BASIC METABOLIC PANEL
Anion gap: 8 (ref 5–15)
BUN: 16 mg/dL (ref 6–20)
CHLORIDE: 105 mmol/L (ref 101–111)
CO2: 24 mmol/L (ref 22–32)
CREATININE: 0.72 mg/dL (ref 0.61–1.24)
Calcium: 7.7 mg/dL — ABNORMAL LOW (ref 8.9–10.3)
GFR calc non Af Amer: >60 mL/min (ref >60)
Glucose, Bld: 215 mg/dL — ABNORMAL HIGH (ref 65–99)
POTASSIUM: 3.8 mmol/L (ref 3.5–5.1)
Sodium: 137 mmol/L (ref 135–145)

## 2015-09-25 LAB — URINE CULTURE

## 2015-09-25 LAB — CBC
HEMATOCRIT: 35.5 % — AB (ref 39.0–52.0)
HEMOGLOBIN: 11.4 g/dL — AB (ref 13.0–17.0)
MCH: 28.5 pg (ref 26.0–34.0)
MCHC: 32.1 g/dL (ref 30.0–36.0)
MCV: 88.8 fL (ref 78.0–100.0)
Platelets: 156 10*3/uL (ref 150–400)
RBC: 4 MIL/uL — AB (ref 4.22–5.81)
RDW: 13.9 % (ref 11.5–15.5)
WBC: 9.9 10*3/uL (ref 4.0–10.5)

## 2015-09-25 LAB — GLUCOSE, CAPILLARY
Glucose-Capillary: 103 mg/dL — ABNORMAL HIGH (ref 65–99)
Glucose-Capillary: 147 mg/dL — ABNORMAL HIGH (ref 65–99)

## 2015-09-25 MED ORDER — METRONIDAZOLE 500 MG PO TABS: 500.0000 mg | ORAL_TABLET | Freq: Three times a day (TID) | ORAL | Status: DC

## 2015-09-25 MED ORDER — CEPHALEXIN 500 MG PO CAPS: 500.0000 mg | ORAL_CAPSULE | Freq: Two times a day (BID) | ORAL | Status: DC

## 2015-09-25 NOTE — Discharge Summary (Addendum)
Physician Discharge Summary  [name] [id number] DOB: 1941-02-21 DOA: 09/22/2015  PCP: Donnie Coffin, MD  Admit date: 09/22/2015 Discharge date: 09/25/2015  Time spent: 30 minutes  Recommendations for Outpatient Follow-up:  1. Follow up with PCP in one week.    Discharge Diagnoses:  Principal Problem:   SIRS (systemic inflammatory response syndrome) (HCC) Active Problems:   Recurrent falls   Fever   Near syncope   PAF (paroxysmal atrial fibrillation) (HCC)   Diabetes mellitus type 2, uncontrolled ([place])   Discharge Condition: improved  Diet recommendation: carb modified diet.   Filed Weights   09/23/15 2150  Weight: 87.227 kg (192 lb 4.8 oz)    History of present illness:  [name] is a 75 y.o. male history of paroxysmal atrial fibrillation, diabetes mellitus type 2, history of stroke, lymphoma in remission , recently admitted and discharged to SNF presents with recurrent falls associated with loose diarrhea and possible recurrent UTI.   Hospital Course:  1. Diarrhea with recurrent falls: - diarrhea appears to have [employer].  - PT eval recommended SNF.  - Hydration.  - no stool sample yet to check for c diff. Discontinued flagyl.   2 PAF: - currently on aspirin and plavix.   3. Diabetes Mellitus: CBG (last 3)   Recent Labs (last 2 labs)      Recent Labs  09/23/15 2148 09/24/15 0808 09/24/15 1108  GLUCAP 194* 102* 184*      Resume SSI and metformin on discharge.   4. SIRS from Recurrent E coli UTI: sensitive to keflex . Discharged on keflex to complete the course.         Procedures:  none  Consultations: none Discharge Exam: Filed Vitals:   09/25/15 0616 09/25/15 1056  BP: 138/42 133/49  Pulse: 62 56  Temp: 98.1 F (36.7 C) 97.9 F (36.6 C)  Resp: 15 18    General: alert afebrile comfortable Cardiovascular: s1s2 Respiratory: ctab  Discharge Instructions   Discharge Instructions     Diet - low sodium heart healthy    Complete by:  As directed      Discharge instructions    Complete by:  As directed   Follow up with PCP in one week.          Current Discharge Medication List    START taking these medications   Details  cephALEXin (KEFLEX) 500 MG capsule Take 1 capsule (500 mg total) by mouth 2 (two) times daily. Qty: 10 capsule, Refills: 0      CONTINUE these medications which have NOT CHANGED   Details  aspirin EC 81 MG EC tablet Take 1 tablet (81 mg total) by mouth daily. Qty: 30 tablet, Refills: 0    clopidogrel (PLAVIX) 75 MG tablet Take 75 mg by mouth daily.    insulin glargine (LANTUS) 100 UNIT/ML injection Inject 25 Units into the skin daily.     metFORMIN (GLUCOPHAGE) 500 MG tablet Take 500 mg by mouth daily with breakfast.     metoprolol tartrate (LOPRESSOR) 25 MG tablet Take 0.5 tablets (12.5 mg total) by mouth 2 (two) times daily. Qty: 60 tablet, Refills: 0    saccharomyces boulardii (FLORASTOR) 250 MG capsule Take 1 capsule (250 mg total) by mouth 2 (two) times daily. Qty: 30 capsule, Refills: 0    simvastatin (ZOCOR) 20 MG tablet Take 20 mg by mouth at bedtime.      tamsulosin (FLOMAX) 0.4 MG CAPS capsule Take 2 capsules (0.8 mg total) by mouth  daily. Qty: 30 capsule, Refills: 0      STOP taking these medications     loperamide (IMODIUM) 2 MG capsule        Allergies  Allergen Reactions  . Actos [Pioglitazone Hydrochloride] Other (See Comments)    Afib  . Hydrochlorothiazide Other (See Comments)    Lower potassium to low  . Penicillins Rash    Tolerated cefepime, ceftriaxone   Follow-up Information    Follow up with Donnie Coffin, MD. Schedule an appointment as soon as possible for a visit in 1 week.   Specialty:  Family Medicine   Contact information:   [address] [place] [address] [phone]        The results of significant diagnostics from this hospitalization (including imaging,  microbiology, ancillary and laboratory) are listed below for reference.    Significant Diagnostic Studies: Dg Chest 2 View  09/23/2015  CLINICAL DATA:  Leukocytosis EXAM: CHEST  2 VIEW COMPARISON:  05/29/2014 FINDINGS: Cardiac shadow is within normal limits. The lungs are well aerated bilaterally. No focal infiltrate or sizable effusion is seen. No acute bony abnormality is noted. IMPRESSION: No active cardiopulmonary disease. Electronically Signed   By: Inez Catalina M.D.   On: 09/23/2015 01:47   Dg Pelvis 1-2 Views  09/23/2015  CLINICAL DATA:  Fall in February and last night. EXAM: PELVIS - 1-2 VIEW COMPARISON:  None. FINDINGS: Mild symmetric degenerative changes in the hips bilaterally. SI joints are symmetric and unremarkable. No acute bony abnormality. Specifically, no fracture, subluxation, or dislocation. Soft tissues are intact. IMPRESSION: No acute bony abnormality. Electronically Signed   By: Rolm Baptise M.D.   On: 09/23/2015 10:35   Ct Head Wo Contrast  09/23/2015  CLINICAL DATA:  Weakness EXAM: CT HEAD WITHOUT CONTRAST TECHNIQUE: Contiguous axial images were obtained from the base of the skull through the vertex without intravenous contrast. COMPARISON:  08/15/2015 FINDINGS: Bony calvarium is intact. Mild atrophic changes are seen. Mild chronic white matter ischemic changes noted. No findings to suggest acute hemorrhage, acute infarction or space-occupying mass lesion are noted. 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 IMPRESSION: Chronic atrophic and ischemic changes without acute abnormality. Electronically Signed   By: Inez Catalina M.D.   On: 09/23/2015 03:05   Dg Knee Complete 4 Views Right  09/23/2015  CLINICAL DATA:  Fall last night. Abrasions and bruising over anterior right knee EXAM: RIGHT KNEE - COMPLETE 4+ VIEW COMPARISON:  None. FINDINGS: No acute bony abnormality. Specifically, no fracture, subluxation, or dislocation. Soft tissues are intact. No joint effusion. Vascular  calcifications noted. IMPRESSION: No acute bony abnormality. Electronically Signed   By: Rolm Baptise M.D.   On: 09/23/2015 10:36   Ir Radiologist Eval & Mgmt  09/05/2015  EXAM: ESTABLISHED PATIENT OFFICE VISIT CHIEF COMPLAINT: History of diabetes mellitus, atrial fibrillation, stroke as mentioned earlier and Charcot Marie Tooth disease. 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 The patient is accompanied by his daughter. According to them, the patient apparently had a spell of severe vertigo and dizziness with presyncope in the last week in February while standing up. This lasted a few minutes. The patient was brought to the hospital where he was evaluated. Also according to the history, the patient has been having 4-5 falls over the recent months on standing up from a sitting position; so much though the  patient had been using a walker to ambulate. However the patient denied any symptoms of blindness, diplopia, visual blurring, difficulty swallowing, perioral numbness. The patient's daughter however reports transient difficulty with hearing while the patient was in the hospital. During this time, the patient had been taking aspirin and Plavix from 2010 onwards after having experienced a posterior fossa ischemic stroke. 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 The patient apparently was then lost to follow-up. By history the patient had been doing reasonably well up until the episode on 08/19/2015. Patient denies any chest pain, shortness of breath, palpitations, ankle swelling. He sleeps with two pillows every night. He denies recent cough, chills, fever or rigors. Denies any hemoptysis or wheezing. He has a fairly good appetite but has been eating healthy which  resulted in his better control of his blood sugar and also corresponding loss of weight. Past Medical History: Cancer, diabetes, high blood pressure, stroke. Atrial fibrillation. B-cell lymphoma in remission. Present Medications: Eliquis for atrial fibrillation. The Plavix was stopped. Aspirin 81 mg a day. Florastor capsule. Lantus insulin. Loperamide. Metformin. Metoprolol. Tamsulosin capsule. Tuberculum PPD. Allergies:  Actos.  Hydrochlorothiazide.  Penicillins. REVIEW OF SYSTEMS: As per mentioned above, otherwise negative for pathologic symptomatology. PHYSICAL EXAMINATION: In no acute distress. Affect normal. 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 Patient ambulates with a wide base gait using a walker. ASSESSMENT AND PLAN: Patient's recent MRI of the brain, and MRA of the brain were reviewed. These were compared to the catheter angiogram of 12/04/2008. There continues to be patency of the anterior circulation extra and intracranially. Dominant posterior communicating arteries are seen. The posterior circulation appears significantly attenuated with flow signal demonstrated in an attenuated basilar artery. The vertebrobasilar junctions are patent. The brain parenchyma however reveals no new ischemic strokes. Generalized atrophy with mild ventriculomegaly remains stable. Given the patient's clinical stability following his discharge, it was felt to continue with conservative management with aspirin 81 mg, and Eliquis because of the patient's atrial fibrillation. Patient has been asked to continue taking his blood pressure medications, and also to maintain good hydration on a daily basis. He was asked not to smoke or drink alcohol or use illicit chemicals. Also patient was warned to continue with tight control of his diabetes mellitus. Questions were answered to their satisfaction. The patient and his daughter understand that  should the patient's symptoms worsen on the above management, further aggressive measures in the form of a catheter angiogram with possible endovascular revascularization would be a significant consideration. They expressed their understanding and agreement with above management plan. Electronically Signed   By: Luanne Bras M.D.   On: 09/03/2015 15:41    Microbiology: Recent Results (from the past 240 hour(s))  Urine culture     Status: None (Preliminary result)   Collection Time: 09/23/15 12:53 AM  Result Value Ref Range Status   Specimen Description URINE, RANDOM  Final   Special Requests NONE  Final   Culture   Final    >=100,000 COLONIES/mL ESCHERICHIA COLI CULTURE REINCUBATED FOR BETTER GROWTH    Report Status PENDING  Incomplete   Organism ID, Bacteria ESCHERICHIA COLI  Final      Susceptibility   Escherichia coli - MIC*    AMPICILLIN 8 SENSITIVE Sensitive     CEFAZOLIN <=4 SENSITIVE Sensitive     CEFTRIAXONE <=1 SENSITIVE Sensitive  CIPROFLOXACIN >=4 RESISTANT Resistant     GENTAMICIN <=1 SENSITIVE Sensitive     IMIPENEM <=0.25 SENSITIVE Sensitive     NITROFURANTOIN <=16 SENSITIVE Sensitive     TRIMETH/SULFA <=20 SENSITIVE Sensitive     AMPICILLIN/SULBACTAM 4 SENSITIVE Sensitive     PIP/TAZO <=4 SENSITIVE Sensitive     * >=100,000 COLONIES/mL ESCHERICHIA COLI  Blood culture (routine x 2)     Status: None (Preliminary result)   Collection Time: 09/23/15 12:55 AM  Result Value Ref Range Status   Specimen Description BLOOD RIGHT ANTECUBITAL  Final   Special Requests BOTTLES DRAWN AEROBIC ONLY 10CC  Final   Culture NO GROWTH 1 DAY  Final   Report Status PENDING  Incomplete  Blood culture (routine x 2)     Status: None (Preliminary result)   Collection Time: 09/23/15  1:00 AM  Result Value Ref Range Status   Specimen Description BLOOD LEFT HAND  Final   Special Requests BOTTLES DRAWN AEROBIC ONLY 10CC  Final   Culture NO GROWTH 1 DAY  Final   Report Status  PENDING  Incomplete     Labs: Basic Metabolic Panel:  Recent Labs Lab 09/22/15 2355 09/23/15 1056 09/23/15 1359 09/24/15 0002 09/25/15 0907  NA 139  --  142 139 137  K 3.9  --  4.2 3.9 3.8  CL 105  --  105 106 105  CO2 23  --  24 23 24   GLUCOSE 189*  --  142* 208* 215*  BUN 21*  --  20 21* 16  CREATININE 0.82 0.85 0.80 0.72 0.72  CALCIUM 8.9  --  8.3* 7.7* 7.7*   Liver Function Tests:  Recent Labs Lab 09/23/15 1359  AST 82*  ALT 53  ALKPHOS 58  BILITOT 0.8  PROT 5.6*  ALBUMIN 2.6*   No results for input(s): LIPASE, AMYLASE in the last 168 hours. No results for input(s): AMMONIA in the last 168 hours. CBC:  Recent Labs Lab 09/22/15 2355 09/23/15 1359 09/24/15 0002 09/25/15 0907  WBC 22.3* 22.2* 14.7* 9.9  NEUTROABS  --  19.9*  --   --   HGB 13.6 12.7* 11.5* 11.4*  HCT 39.9 39.1 33.9* 35.5*  MCV 88.3 89.1 89.0 88.8  PLT 180 172 129* 156   Cardiac Enzymes: No results for input(s): CKTOTAL, CKMB, CKMBINDEX, TROPONINI in the last 168 hours. BNP: BNP (last 3 results) No results for input(s): BNP in the last 8760 hours.  ProBNP (last 3 results) No results for input(s): PROBNP in the last 8760 hours.  CBG:  Recent Labs Lab 09/24/15 0808 09/24/15 1108 09/24/15 1526 09/24/15 2053 09/25/15 0811  GLUCAP 102* 184* 205* 229* 103*       SignedHosie Poisson MD.  [facility] 09/25/2015, 11:56 AM

## 2015-09-25 NOTE — Progress Notes (Signed)
Inpatient Diabetes Program Recommendations  AACE/ADA: New Consensus Statement on Inpatient Glycemic Control (2015)  Target Ranges:  Prepandial:   less than 140 mg/dL      Peak postprandial:   less than 180 mg/dL (1-2 hours)      Critically ill patients:  140 - 180 mg/dL   Results for FAHD, ANTCZAK (MRN [id number]) as of 09/25/2015 09:27  Ref. Range 09/24/2015 08:08 09/24/2015 11:08 09/24/2015 15:26 09/24/2015 20:53 09/25/2015 08:11  Glucose-Capillary Latest Ref Range: 65-99 mg/dL 102 (H) 184 (H) 205 (H) 229 (H) 103 (H)   Review of Glycemic Control  Diabetes history: DM 2 Outpatient Diabetes medications: Lantus 25 units Daily, Metformin 500 mg Daily Current orders for Inpatient glycemic control: Lantus 25 units Daily, Novolog Sensitive TID  Inpatient Diabetes Program Recommendations: Insulin - Meal Coverage: Glucose increased around meal times into the 200's. May want to consider a small dose of meal coverage, Novolog 3 units TID in addition to correction scale.  Thanks,  Tama Headings RN, MSN, [facility] Inpatient Diabetes Coordinator Team Pager [phone] (8a-5p)

## 2015-09-25 NOTE — Progress Notes (Signed)
Patient discharged to [facility] care  Report call to [facility] LPN     Patient transported by [facility] left floor via stretcher. No c/o pain or shortness of breath at d/c. Personal items sent with patient discharge paper work faxed to facility by social work.  Falesha Schommer, Tivis Ringer, RN

## 2015-09-25 NOTE — Clinical Social Work Note (Signed)
Patient to be discharged to [facility]. Patient updated regarding discharge. Patient to be transported via EMS (approximate time 3pm).  RN report number: [place], [facility]: [phone] Surgical: [phone]

## 2015-09-25 NOTE — Clinical Social Work Note (Signed)
Clinical Social Work Assessment  Patient Details  Name: [name] MRN: [id number] Date of Birth: 03-18-1941  Date of referral:  09/25/15               Reason for consult:  Discharge Planning                Permission sought to share information with:  Chartered certified accountant granted to share information::  Yes, Verbal Permission Granted  Name::        Agency::  [place]  Relationship::     Contact Information:     Housing/Transportation Living arrangements for the past 2 months:  [place] of Information:  Patient Patient Interpreter Needed:  None Criminal Activity/Legal Involvement Pertinent to Current Situation/Hospitalization:  No - Comment as needed Significant Relationships:  Spouse Lives with:  Self Do you feel safe going back to the place where you live?  No Need for family participation in patient care:  No (Coment) (Patient able to make own decisions.)  Care giving concerns:  Patient expressed no concerns at this time.   Social Worker assessment / plan:  CSW received referral for possible SNF placement at time of discharge. Per patient, patient requesting to discharge to [facility] where patient's wife is currently a resident. Patient states if patient can not admit to [facility], patient will discharge home. CSW to continue to follow and assist with discharge planning needs.  Employment status:  Retired Science writer) PT Recommendations:  [place] / Referral to community resources:  [place]  Patient/Family's Response to care:  Patient understanding and agreeable to CSW plan of care.  Patient/Family's Understanding of and Emotional Response to Diagnosis, Current Treatment, and Prognosis:  Patient understanding and agreeable to CSW plan of care.  Emotional Assessment Appearance:  Appears stated  age Attitude/Demeanor/Rapport:  Other (Appropriate) Affect (typically observed):  Accepting, Appropriate, Pleasant Orientation:  Oriented to Self, Oriented to Place, Oriented to  Time, Oriented to Situation Alcohol / Substance use:  Not Applicable Psych involvement (Current and /or in the community):  No (Comment) (Not appropriate on this admission.)  Discharge Needs  Concerns to be addressed:  No discharge needs identified Readmission within the last 30 days:  No Current discharge risk:  None Barriers to Discharge:  No Barriers Identified   Caroline Sauger, LCSW 09/25/2015, 12:57 PM [phone]

## 2015-09-25 NOTE — Clinical Social Work Placement (Signed)
   CLINICAL SOCIAL WORK PLACEMENT  NOTE  Date:  09/25/2015  Patient Details  Name: [name] MRN: [id number] Date of Birth: [birth year]  Clinical Social Work is seeking post-discharge placement for this patient at the [place] level of care (*CSW will initial, date and re-position this form in  chart as items are completed):  Yes   Patient/family provided with [place] Work Department's list of facilities offering this level of care within the geographic area requested by the patient (or if unable, by the patient's family).  Yes   Patient/family informed of their freedom to choose among providers that offer the needed level of care, that participate in Medicare, Medicaid or managed care program needed by the patient, have an available bed and are willing to accept the patient.  Yes   Patient/family informed of [place]'s ownership interest in [facility] and [facility], as well as of the fact that they are under no obligation to receive care at these facilities.  PASRR submitted to EDS on       PASRR number received on       Existing PASRR number confirmed on 09/25/15     FL2 transmitted to all facilities in geographic area requested by pt/family on 09/25/15     FL2 transmitted to all facilities within larger geographic area on       Patient informed that his/her managed care company has contracts with or will negotiate with certain facilities, including the following:        Yes   Patient/family informed of bed offers received.  Patient chooses bed at [facility]     Physician recommends and patient chooses bed at      Patient to be transferred to [facility] on 09/25/15.  Patient to be transferred to facility by [facility]     Patient family notified on 09/25/15 of transfer.  Name of family member notified:  Patient     PHYSICIAN       Additional Comment:     _______________________________________________ Caroline Sauger, LCSW 09/25/2015, 1:01 PM

## 2015-09-25 NOTE — NC FL2 (Signed)
[place] LEVEL OF CARE SCREENING TOOL     IDENTIFICATION  Patient Name: [name] Birthdate: [birth year] Sex: male Admission Date (Current Location): 09/22/2015  [facility] and [place] Number:  Herbalist and Address:  [facility] [place] [address] [place], [place] [address]      Provider Number: [id number]  Attending Physician Name and Address:  Hosie Poisson, MD  Relative Name and Phone Number:       Current Level of Care: Hospital Recommended Level of Care: [place] Prior Approval Number:    Date Approved/Denied:   PASRR Number: [id number]  Discharge Plan: SNF    Current Diagnoses: Patient Active Problem List   Diagnosis Date Noted  . Fever 09/23/2015  . SIRS (systemic inflammatory response syndrome) ([place]) 09/23/2015  . Near syncope 09/23/2015  . PAF (paroxysmal atrial fibrillation) ([place]) 09/23/2015  . Diabetes mellitus type 2, uncontrolled ([place]) 09/23/2015  . 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 Vertebrobasilar artery syndrome   . Orthostatic hypotension   . Insulin dependent diabetes mellitus ([place])   . Falls frequently   . Weakness 08/15/2015  . Cellulitis of right upper arm 06/02/2014  . Blood poisoning ([place])   . Encephalopathy 05/29/2014  . Sepsis ([place]) 05/29/2014  . UTI (lower urinary tract infection) 09/16/2013  . Recurrent falls 09/16/2013  . History of CVA (cerebrovascular accident) 09/16/2013  . Leukocytosis 09/16/2013  . Thrombocytopenia ([place]) 09/16/2013  . B-cell lymphoma ([place]) 10/09/2011  . Diabetes mellitus ([place]) 10/09/2011  . Hypertension 10/09/2011  . A-fib (HCC)     Orientation RESPIRATION BLADDER Height & Weight     Self, Time, Situation, Place  Normal Continent Weight: 192 lb 4.8 oz (87.227 kg) Height:  5\' 11"  (180.3 cm)  BEHAVIORAL SYMPTOMS/MOOD NEUROLOGICAL BOWEL NUTRITION STATUS      Continent Diet (Please see discharge summary.)  AMBULATORY STATUS COMMUNICATION OF NEEDS  Skin   Limited Assist Verbally Normal                       Personal Care Assistance Level of Assistance  Bathing, Feeding, Dressing Bathing Assistance: Limited assistance Feeding assistance: Independent Dressing Assistance: Limited assistance     Functional Limitations Info             SPECIAL CARE FACTORS FREQUENCY  PT (By licensed PT), OT (By licensed OT)                    Contractures      Additional Factors Info  Allergies, Code Status, Isolation Precautions Code Status Info: FULL Allergies Info: Actos, Hydrochlorothiazide, Penicillins     Isolation Precautions Info: Enteric precautions     Current Medications (09/25/2015):  This is the current hospital active medication list Current Facility-Administered Medications  Medication Dose Route Frequency Provider Last Rate Last Dose  . aspirin EC tablet 81 mg  81 mg Oral Daily Rise Patience, MD   81 mg at 09/25/15 0945  . cefTRIAXone (ROCEPHIN) 1 g in dextrose 5 % 50 mL IVPB  1 g Intravenous Q24H Domenic Polite, MD   1 g at 09/25/15 0223  . clopidogrel (PLAVIX) tablet 75 mg  75 mg Oral Daily Rise Patience, MD   75 mg at 09/25/15 0945  . insulin aspart (novoLOG) injection 0-9 Units  0-9 Units Subcutaneous TID WC Rise Patience, MD   3 Units at 09/24/15 1800  . insulin glargine (LANTUS) injection 25 Units  25 Units Subcutaneous Daily Rise Patience, MD   25 Units at 09/25/15 0945  . metoprolol tartrate (LOPRESSOR) tablet 25 mg  25 mg Oral BID Rise Patience, MD   25 mg at 09/25/15 [id number]  . simvastatin (ZOCOR) tablet 20 mg  20 mg Oral QHS Rise Patience, MD   20 mg at 09/24/15 2346  . tamsulosin (FLOMAX) capsule 0.8 mg  0.8 mg Oral Daily Rise Patience, MD   0.8 mg at 09/25/15 [id number]     Discharge Medications: Please see discharge summary for a list of discharge medications.  Relevant Imaging Results:  Relevant Lab Results:   Additional Information SSN:  [id number]  Caroline Sauger, LCSW

## 2015-09-28 LAB — CULTURE, BLOOD (ROUTINE X 2)
CULTURE: NO GROWTH
Culture: NO GROWTH

## 2015-10-10 ENCOUNTER — Ambulatory Visit: Payer: Medicare Other | Admitting: Neurology

## 2015-10-22 ENCOUNTER — Ambulatory Visit: Payer: Medicare Other | Admitting: Podiatry

## 2017-06-22 ENCOUNTER — Inpatient Hospital Stay (HOSPITAL_COMMUNITY)
Admission: EM | Admit: 2017-06-22 | Discharge: 2017-06-28 | DRG: 690 | Disposition: A | Discharge status: Rehabilitation Facility | Payer: Medicare Other | Attending: Internal Medicine | Admitting: Internal Medicine

## 2017-06-22 ENCOUNTER — Encounter (HOSPITAL_COMMUNITY): Payer: Self-pay

## 2017-06-22 ENCOUNTER — Other Ambulatory Visit: Payer: Self-pay

## 2017-06-22 DIAGNOSIS — Z7902 Long term (current) use of antithrombotics/antiplatelets: Secondary | ICD-10-CM

## 2017-06-22 DIAGNOSIS — Z87440 Personal history of urinary (tract) infections: Secondary | ICD-10-CM

## 2017-06-22 DIAGNOSIS — G600 Hereditary motor and sensory neuropathy: Secondary | ICD-10-CM | POA: Diagnosis present

## 2017-06-22 DIAGNOSIS — E876 Hypokalemia: Secondary | ICD-10-CM | POA: Diagnosis present

## 2017-06-22 DIAGNOSIS — E860 Dehydration: Secondary | ICD-10-CM | POA: Diagnosis present

## 2017-06-22 DIAGNOSIS — K529 Noninfective gastroenteritis and colitis, unspecified: Secondary | ICD-10-CM | POA: Diagnosis present

## 2017-06-22 DIAGNOSIS — Z880 Allergy status to penicillin: Secondary | ICD-10-CM

## 2017-06-22 DIAGNOSIS — I10 Essential (primary) hypertension: Secondary | ICD-10-CM | POA: Diagnosis present

## 2017-06-22 DIAGNOSIS — R338 Other retention of urine: Secondary | ICD-10-CM | POA: Diagnosis present

## 2017-06-22 DIAGNOSIS — Z888 Allergy status to other drugs, medicaments and biological substances status: Secondary | ICD-10-CM

## 2017-06-22 DIAGNOSIS — Z79899 Other long term (current) drug therapy: Secondary | ICD-10-CM

## 2017-06-22 DIAGNOSIS — I480 Paroxysmal atrial fibrillation: Secondary | ICD-10-CM | POA: Diagnosis present

## 2017-06-22 DIAGNOSIS — N35911 Unspecified urethral stricture, male, meatal: Secondary | ICD-10-CM | POA: Diagnosis present

## 2017-06-22 DIAGNOSIS — Z8249 Family history of ischemic heart disease and other diseases of the circulatory system: Secondary | ICD-10-CM

## 2017-06-22 DIAGNOSIS — I160 Hypertensive urgency: Secondary | ICD-10-CM | POA: Diagnosis present

## 2017-06-22 DIAGNOSIS — I4891 Unspecified atrial fibrillation: Secondary | ICD-10-CM | POA: Diagnosis present

## 2017-06-22 DIAGNOSIS — N179 Acute kidney failure, unspecified: Secondary | ICD-10-CM

## 2017-06-22 DIAGNOSIS — N401 Enlarged prostate with lower urinary tract symptoms: Secondary | ICD-10-CM | POA: Diagnosis present

## 2017-06-22 DIAGNOSIS — Z87448 Personal history of other diseases of urinary system: Secondary | ICD-10-CM | POA: Diagnosis not present

## 2017-06-22 DIAGNOSIS — IMO0002 Reserved for concepts with insufficient information to code with codable children: Secondary | ICD-10-CM | POA: Diagnosis present

## 2017-06-22 DIAGNOSIS — R339 Retention of urine, unspecified: Secondary | ICD-10-CM

## 2017-06-22 DIAGNOSIS — Z794 Long term (current) use of insulin: Secondary | ICD-10-CM

## 2017-06-22 DIAGNOSIS — Z923 Personal history of irradiation: Secondary | ICD-10-CM

## 2017-06-22 DIAGNOSIS — M6282 Rhabdomyolysis: Secondary | ICD-10-CM | POA: Diagnosis present

## 2017-06-22 DIAGNOSIS — N390 Urinary tract infection, site not specified: Principal | ICD-10-CM | POA: Diagnosis present

## 2017-06-22 DIAGNOSIS — E1165 Type 2 diabetes mellitus with hyperglycemia: Secondary | ICD-10-CM | POA: Diagnosis present

## 2017-06-22 DIAGNOSIS — E1142 Type 2 diabetes mellitus with diabetic polyneuropathy: Secondary | ICD-10-CM | POA: Diagnosis present

## 2017-06-22 DIAGNOSIS — E785 Hyperlipidemia, unspecified: Secondary | ICD-10-CM | POA: Diagnosis present

## 2017-06-22 DIAGNOSIS — I69351 Hemiplegia and hemiparesis following cerebral infarction affecting right dominant side: Secondary | ICD-10-CM

## 2017-06-22 DIAGNOSIS — R531 Weakness: Secondary | ICD-10-CM

## 2017-06-22 DIAGNOSIS — R159 Full incontinence of feces: Secondary | ICD-10-CM | POA: Diagnosis present

## 2017-06-22 DIAGNOSIS — D696 Thrombocytopenia, unspecified: Secondary | ICD-10-CM | POA: Diagnosis not present

## 2017-06-22 DIAGNOSIS — Z9221 Personal history of antineoplastic chemotherapy: Secondary | ICD-10-CM

## 2017-06-22 DIAGNOSIS — Z8572 Personal history of non-Hodgkin lymphomas: Secondary | ICD-10-CM

## 2017-06-22 DIAGNOSIS — Z7982 Long term (current) use of aspirin: Secondary | ICD-10-CM

## 2017-06-22 DIAGNOSIS — Z87891 Personal history of nicotine dependence: Secondary | ICD-10-CM

## 2017-06-22 HISTORY — DX: Weakness: R53.1

## 2017-06-22 LAB — CBC WITH DIFFERENTIAL/PLATELET
Basophils Absolute: 0 K/uL (ref 0.0–0.1)
Basophils Relative: 0 %
Eosinophils Absolute: 0 K/uL (ref 0.0–0.7)
Eosinophils Relative: 0 %
HCT: 50 % (ref 39.0–52.0)
Hemoglobin: 17.5 g/dL — ABNORMAL HIGH (ref 13.0–17.0)
Lymphocytes Relative: 9 %
Lymphs Abs: 1.4 K/uL (ref 0.7–4.0)
MCH: 31.4 pg (ref 26.0–34.0)
MCHC: 35 g/dL (ref 30.0–36.0)
MCV: 89.8 fL (ref 78.0–100.0)
Monocytes Absolute: 0.9 K/uL (ref 0.1–1.0)
Monocytes Relative: 6 %
Neutro Abs: 12.9 K/uL — ABNORMAL HIGH (ref 1.7–7.7)
Neutrophils Relative %: 85 %
Platelets: 122 K/uL — ABNORMAL LOW (ref 150–400)
RBC: 5.57 MIL/uL (ref 4.22–5.81)
RDW: 13.7 % (ref 11.5–15.5)
WBC: 15.2 K/uL — ABNORMAL HIGH (ref 4.0–10.5)

## 2017-06-22 LAB — BASIC METABOLIC PANEL WITH GFR
Anion gap: 17 — ABNORMAL HIGH (ref 5–15)
BUN: 33 mg/dL — ABNORMAL HIGH (ref 6–20)
CO2: 23 mmol/L (ref 22–32)
Calcium: 8.8 mg/dL — ABNORMAL LOW (ref 8.9–10.3)
Chloride: 96 mmol/L — ABNORMAL LOW (ref 101–111)
Creatinine, Ser: 1.88 mg/dL — ABNORMAL HIGH (ref 0.61–1.24)
GFR calc Af Amer: 38 mL/min — ABNORMAL LOW
GFR calc non Af Amer: 33 mL/min — ABNORMAL LOW
Glucose, Bld: 208 mg/dL — ABNORMAL HIGH (ref 65–99)
Potassium: 4.5 mmol/L (ref 3.5–5.1)
Sodium: 136 mmol/L (ref 135–145)

## 2017-06-22 LAB — URINALYSIS, ROUTINE W REFLEX MICROSCOPIC
Bacteria, UA: NONE SEEN
Bilirubin Urine: NEGATIVE
Glucose, UA: 50 mg/dL — AB
Ketones, ur: 5 mg/dL — AB
Nitrite: NEGATIVE
Protein, ur: 100 mg/dL — AB
Specific Gravity, Urine: 1.011 (ref 1.005–1.030)
Squamous Epithelial / HPF: NONE SEEN
pH: 5 (ref 5.0–8.0)

## 2017-06-22 MED ORDER — DEXTROSE 5 % IV SOLN
1.0000 g | Freq: Once | INTRAVENOUS | Status: AC
  Administered 2017-06-23: 1 g via INTRAVENOUS
  Filled 2017-06-22: qty 10

## 2017-06-22 MED ORDER — GI COCKTAIL ~~LOC~~
30.0000 mL | Freq: Once | ORAL | Status: AC
  Administered 2017-06-22: 30 mL via ORAL
  Filled 2017-06-22: qty 30

## 2017-06-22 MED ORDER — SODIUM CHLORIDE 0.9 % IV BOLUS (SEPSIS)
1000.0000 mL | Freq: Once | INTRAVENOUS | Status: AC
  Administered 2017-06-22: 1000 mL via INTRAVENOUS

## 2017-06-22 MED ORDER — SODIUM CHLORIDE 0.9 % IV BOLUS (SEPSIS)
1000.0000 mL | Freq: Once | INTRAVENOUS | Status: AC
  Administered 2017-06-23: 1000 mL via INTRAVENOUS

## 2017-06-22 MED ORDER — METOPROLOL TARTRATE 25 MG PO TABS
12.5000 mg | ORAL_TABLET | Freq: Once | ORAL | Status: AC
  Administered 2017-06-22: 12.5 mg via ORAL
  Filled 2017-06-22: qty 1

## 2017-06-22 NOTE — ED Notes (Signed)
Pt given ice water, encouraged oral fluids. Pt aware needs urine sample.

## 2017-06-22 NOTE — H&P (Addendum)
History and Physical    [name] UXN:[id number] DOB: 19-Sep-1940 DOA: 06/22/2017  Referring MD/NP/PA: Dr. Addison Lank PCP: Alroy Dust, L.Marlou Sa, MD  Patient coming from:  Home via EMS  Chief Complaint: Weakness  I have personally briefly reviewed patient's old medical records in [place]   HPI: [name] is a 77 y.o. male with medical history significant of  HTN, afib, NHL s/p chemo/rad, DM type II, urological issues including urethral stricture s/p dilations; who presents with complaints of weakness unable to get off the bathroom toilet of his home since approximately 500.   He was found by his daughter around 1500.  Prior to onset of symptoms patient had been in his normal state of health and had watched the [employer] celebrations on his television at home.  At baseline patient is able to complete all of his ADLs and lives alone.  While getting up to use the bathroom this morning he reports stumbling, but denies having any falls.  While using the bathroom he complained of some associated symptoms of dysuria.  When trying to get up after using the restroom noted that he was unable to stand and sat there for approximately 10 hours.  He reports missing taking all of his medications and not having anything to eat or drink.  Patient has a known history of urological issues for which he patient reports started after being circumcised in his 56s.  Reports requiring multiple dilations in the past, and was given a device to dilate himself at home, but he has not utilized this in several years.  Previously followed by Dr. Jeffie Pollock, but last seen in 2012.  He also incidentally notes that he previously had been on blood thinners but had several issues with bleeding and declined being put on Eliquis.  He was apparently placed on Plavix instead.  Patient complains of developing allergy to Plavix for which he had self decrease dosage only taking the medicine twice per week, and reports completely  stopping aspirin altogether approximately 3 months ago due to nosebleeds..  ED Course: Upon admission into the emergency department patient was seen to be febrile with blood pressure 182/56 - 200/67, and all other vital signs within normal limits.  Labs revealed WBC 15.2, hemoglobin 17.5, platelets 122, BUN 33, creatinine 1.88, glucose 208, and CPK pending.  Urinalysis showed moderate leukocytes large hemoglobin no RBCs, no bacteria, and TNTC WBCs.  He was started on empiric antibiotics of Rocephin.  Bladder scan revealed 475 mL of urine.  A pediatric Foley catheter was placed and urology was consulted to evaluate the patient.  He was also given 2 L of normal saline IV fluids.  TRH called to admit.   Review of Systems  Constitutional: Negative for chills and fever.  HENT: Negative for ear discharge and nosebleeds.   Eyes: Positive for discharge. Negative for blurred vision and double vision.  Respiratory: Negative for cough and shortness of breath.   Cardiovascular: Negative for chest pain and leg swelling.  Gastrointestinal: Positive for diarrhea (Patient states that this is chronic and wears adult breifs at baseline). Negative for blood in stool, heartburn, nausea and vomiting.       Positive for bowel incontinence chronic  Genitourinary: Positive for dysuria. Negative for frequency.  Musculoskeletal: Positive for myalgias (Buttocks). Negative for falls.  Skin: Negative for itching.       Bruising of buttocks  Neurological: Positive for weakness. Negative for tingling, sensory change, speech change, loss of consciousness and headaches.  Endo/Heme/Allergies: Negative for polydipsia. Bruises/bleeds easily.  Psychiatric/Behavioral: Negative for substance abuse. The patient is not nervous/anxious.     Past Medical History:  Diagnosis Date  . A-fib ([place])   . 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 B-cell lymphoma ([place])   . Cataract   . Charcot-Marie-Tooth disease   . Diabetes mellitus   .  Hypertension   . 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 SIRS (systemic inflammatory response syndrome) ([place]) 09/2015  . Stroke [facility])     Past Surgical History:  Procedure Laterality Date  . ANKLE FUSION Left   . URETHRA SURGERY       reports that he quit smoking about 26 years ago. He has a 70.00 pack-year smoking history. He quit smokeless tobacco use about 27 years ago. He reports that he does not drink alcohol or use drugs.  Allergies  Allergen Reactions  . Actos [Pioglitazone Hydrochloride] Other (See Comments)    Afib  . Hydrochlorothiazide Other (See Comments)    Lower potassium to low  . Penicillins Rash    Tolerated cefepime, ceftriaxone    Family History  Problem Relation Age of Onset  . Hypertension Mother   . Hypertension Father     Prior to Admission medications   Medication Sig Start Date End Date Taking? Authorizing Provider  aspirin EC 81 MG EC tablet Take 1 tablet (81 mg total) by mouth daily. Patient taking differently: Take 81 mg by mouth 3 (three) times a week. Monday, Wednesday and Friday 08/19/15   Florencia Reasons, MD  cephALEXin (KEFLEX) 500 MG capsule Take 1 capsule (500 mg total) by mouth 2 (two) times daily. 09/25/15   Hosie Poisson, MD  clopidogrel (PLAVIX) 75 MG tablet Take 75 mg by mouth daily.    [provider]  insulin glargine (LANTUS) 100 UNIT/ML injection Inject 25 Units into the skin daily.     [provider]  metFORMIN (GLUCOPHAGE) 500 MG tablet Take 500 mg by mouth daily with breakfast.  05/10/13   [provider]  metoprolol tartrate (LOPRESSOR) 25 MG tablet Take 0.5 tablets (12.5 mg total) by mouth 2 (two) times daily. Patient taking differently: Take 25 mg by mouth 2 (two) times daily.  08/19/15   Florencia Reasons, MD  saccharomyces boulardii (FLORASTOR) 250 MG capsule Take 1 capsule (250 mg total) by mouth 2 (two) times daily. Patient taking differently: Take 250 mg by mouth 2 (two)  times daily as needed (stomache).  09/18/13   Thurnell Lose, MD  simvastatin (ZOCOR) 20 MG tablet Take 20 mg by mouth at bedtime.      [provider]  tamsulosin (FLOMAX) 0.4 MG CAPS capsule Take 2 capsules (0.8 mg total) by mouth daily. 08/19/15   Florencia Reasons, MD    Physical Exam:  Constitutional: Early male NAD, calm, comfortable Vitals:   06/22/17 1901 06/22/17 1903 06/22/17 1931 06/22/17 2308  BP: (!) 200/67 (!) 197/61  (!) 182/56  Pulse: 83   71  Resp: 14   18  Temp:   98.1 F (36.7 C)   TempSrc:   Oral   SpO2: 100%   100%  Weight:      Height:       Eyes: PERRL, lids and conjunctivae normal ENMT: Mucous membranes are moist. Posterior pharynx clear of any exudate or lesions. Dentures present Neck: normal, supple, no masses, no thyromegaly Respiratory: clear to auscultation bilaterally, no wheezing, no crackles. Normal respiratory  effort. No accessory muscle use.  Cardiovascular: Regular rate and rhythm, no murmurs / rubs / gallops. No extremity edema. 2+ pedal pulses. No carotid bruits.   Abdomen: no tenderness, no masses palpated. No hepatosplenomegaly. Bowel sounds positive.  Musculoskeletal: no clubbing / cyanosis. No joint deformity upper and lower extremities. Good ROM, no contractures. Normal muscle tone.  Skin: Bruising of the buttocks present. Neurologic: CN 2-12 grossly intact. Sensation intact, DTR normal. Strength 5/5 in the left upper and lower extremity and +4/ 5 on the right upper and lower extremity Psychiatric: Normal judgment and insight. Alert and oriented x 3. Normal mood.     Labs on Admission: I have personally reviewed following labs and imaging studies  CBC: Recent Labs  Lab 06/22/17 1740  WBC 15.2*  NEUTROABS 12.9*  HGB 17.5*  HCT 50.0  MCV 89.8  PLT 062*   Basic Metabolic Panel: Recent Labs  Lab 06/22/17 1740  NA 136  K 4.5  CL 96*  CO2 23  GLUCOSE 208*  BUN 33*  CREATININE 1.88*  CALCIUM 8.8*   GFR: Estimated  Creatinine Clearance: 33.4 mL/min (A) (by C-G formula based on SCr of 1.88 mg/dL (H)). Liver Function Tests: No results for input(s): AST, ALT, ALKPHOS, BILITOT, PROT, ALBUMIN in the last 168 hours. No results for input(s): LIPASE, AMYLASE in the last 168 hours. No results for input(s): AMMONIA in the last 168 hours. Coagulation Profile: No results for input(s): INR, PROTIME in the last 168 hours. Cardiac Enzymes: No results for input(s): CKTOTAL, CKMB, CKMBINDEX, TROPONINI in the last 168 hours. BNP (last 3 results) No results for input(s): PROBNP in the last 8760 hours. HbA1C: No results for input(s): HGBA1C in the last 72 hours. CBG: No results for input(s): GLUCAP in the last 168 hours. Lipid Profile: No results for input(s): CHOL, HDL, LDLCALC, TRIG, CHOLHDL, LDLDIRECT in the last 72 hours. Thyroid Function Tests: No results for input(s): TSH, T4TOTAL, FREET4, T3FREE, THYROIDAB in the last 72 hours. Anemia Panel: No results for input(s): VITAMINB12, FOLATE, FERRITIN, TIBC, IRON, RETICCTPCT in the last 72 hours. Urine analysis:    Component Value Date/Time   COLORURINE YELLOW 06/22/2017 2207   APPEARANCEUR HAZY (A) 06/22/2017 2207   LABSPEC 1.011 06/22/2017 2207   PHURINE 5.0 06/22/2017 2207   GLUCOSEU 50 (A) 06/22/2017 2207   HGBUR LARGE (A) 06/22/2017 2207   BILIRUBINUR NEGATIVE 06/22/2017 2207   KETONESUR 5 (A) 06/22/2017 2207   PROTEINUR 100 (A) 06/22/2017 2207   UROBILINOGEN 0.2 05/29/2014 2125   NITRITE NEGATIVE 06/22/2017 2207   LEUKOCYTESUR MODERATE (A) 06/22/2017 2207   Sepsis Labs: No results found for this or any previous visit (from the past 240 hour(s)).   Radiological Exams on Admission: No results found.  EKG: Independently reviewed.  Sinus rhythm with bifascicular block change from previous tracings in 2017  Assessment/Plan Rhabdomyolysis: Acute.  CPK came back elevated at 12,430.  Urinalysis positive for large hemoglobin but negative for RBCs suspect  likely myoglobin.  Patient received 2 L of normal saline IV fluids while in the ED. - Admit to a telemetry bed - Strict ins and outs - Aggressive IV fluids of normal saline liter of IV fluids then placed rate of 150 mL/h as tolerated - Fentanyl prn pain - Daily CPK - Check CMP in a.m.  Acute renal failure, ureteral stricture, urinary retention: Acute.  Patient with previous history of ureteral stricture requiring dilations.  Patient's baseline creatinine previously within normal limits, but presents with a creatinine of  1.88 and BUN 33.  Symptoms appear to be multifactorial including dehydration, acute urinary retention due to ureteral stricture as seen to be retaining 475 mL of urine, rhabdomyolysis, and possibility of infection.  Urology consulted and plan on dilation and placement of Foley catheter. - IV fluids as tolerated - Hold nephrotoxic agents - Monitor kidney function - Appreciate Dr. Dayle Points urology consultative services, follow-up for further recommendations - Check renal ultrasound  Suspected urinary tract infection: Acute.  Urinalysis revealed moderate leukocytes, negative nitrates, TNTC WBCs, negative nitrites, but no bacteria.  Patient started on Rocephin for concern of UTI despite  - Follow-up urine culture - Continue Rocephin, and de-escalate when medically appropriate  Leukocytosis: Acute. WBC elevated at 15.2 on admission suspect secondary to above. - Recheck CBC in a.m.  Hypertensive urgency: Patient had not taken his normal home blood pressure medications. - Held lisinopril due to kidney injury  - Continue metoprolol - Hydralazine IV prn  History of CVA with residual deficit: Patient noted to have right-sided weakness as result of previous stroke that is unchanged.  Patient has self changed doses of Plavix  to twice weekly and had completely stopped aspirin although previously supposed to be on aspirin 3 times weekly. - Continue aspirin and Plavix - Would continue to  recommend taking these medications due to overall benefits  Generalized weakness: Acute.  Patient reports stumbling around prior to sitting on the toilet this a.m., but was unable to ambulate thereafter due to weakness. - Physical therapy to eval and treat starting in 2 -3 days given rhabdomyolysis  Diabetes mellitus type 2 : Patient presents with blood glucose of 208.  Last availablehemoglobin A1c 7.2 in 07/2015. - Hypoglycemic protocol - Check hemoglobin A1c in - Hold metformin and repaglinide  - Continue Lantus  - CBGs q. before meals and at bedtime with sensitive SSI  Paroxysmal atrial fibrillation: Patient currently in sinus rhythm. Chadsvasc score = 6.  Patient apparently refused being placed on Eliquis due to he reports his history of easy bleeding. - Continue to monitor  Thrombocytopenia: Chronic. Platelet count 122 on admission - Continue to monitor  Hyperlipidemia - Held simvastatin pending CPK  Diarrhea: Patient notes history of chronic diarrhea with incontinence of stools for at least the last year. - Continue Imodium as needed  BPH - Continue Flomax  History of non-Hodgkin's lymphoma: Patient reports being status post radiation and chemotherapy.  DVT prophylaxis: Lovenox Code Status: full Family Communication: Discussed plan of care with the patient family present at bedside Disposition Plan: TBD Consults called: Urology Admission status: inpatient   Norval Morton MD [facility] Pager [phone]   If 7PM-7AM, please contact night-coverage [email or web address] Password [facility]  06/22/2017, 11:34 PM

## 2017-06-22 NOTE — ED Provider Notes (Signed)
[facility] EMERGENCY DEPARTMENT Provider Note  CSN: [id number] Arrival date & time: 06/22/17 1646  Chief Complaint(s) Weakness  HPI [name] is a 77 y.o. male   The history is provided by the patient.   CC: fatigue  Onset/Duration: 14 hrs Timing: constant, unchanged Location: generalized Severity: severe Modifying Factors:  Improved by: nothing  Worsened by: nothing Associated Signs/Symptoms:  Pertinent (+): difficulty standing, dysuria that was noted this am  Pertinent (-): fever, chills, headache, CP, SOB, cough, abd pain, N/V/D.  Patient reports that he sat on the toilet until 3:00 this afternoon due to inability to stand from his generalized fatigue. His daughter found him when she came over after not hearing from him today. Patient denies any falls or trauma.  States that he has not had anything to drink or eat since last night.   Stated that he usually has difficulty standing and requires a walker to assist however he is able to ambulate without a walker at baseline.  States that he does use a walker when getting food as a has a tray on the walker to assist him.   Past Medical History Past Medical History:  Diagnosis Date  . A-fib ([place])   . 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 B-cell lymphoma ([place])   . Cataract   . Charcot-Marie-Tooth disease   . Diabetes mellitus   . Hypertension   . 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 SIRS (systemic inflammatory response syndrome) ([place]) 09/2015  . Stroke [facility])    Patient Active Problem List   Diagnosis Date Noted  . Fever 09/23/2015  . SIRS (systemic inflammatory response syndrome) ([place]) 09/23/2015  . Near syncope 09/23/2015  . PAF (paroxysmal atrial fibrillation) ([place]) 09/23/2015  . Diabetes mellitus type 2, uncontrolled ([place]) 09/23/2015  . 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 Vertebrobasilar artery syndrome   . Orthostatic hypotension   . Insulin dependent  diabetes mellitus ([place])   . Falls frequently   . Weakness 08/15/2015  . Cellulitis of right upper arm 06/02/2014  . Blood poisoning   . Encephalopathy 05/29/2014  . Sepsis ([place]) 05/29/2014  . UTI (lower urinary tract infection) 09/16/2013  . Recurrent falls 09/16/2013  . History of CVA (cerebrovascular accident) 09/16/2013  . Leukocytosis 09/16/2013  . Thrombocytopenia ([place]) 09/16/2013  . 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 Diabetes mellitus ([place]) 10/09/2011  . Hypertension 10/09/2011  . A-fib ([place])    Home Medication(s) Prior to Admission medications   Medication Sig Start Date End Date Taking? Authorizing Provider  aspirin EC 81 MG EC tablet Take 1 tablet (81 mg total) by mouth daily. Patient taking differently: Take 81 mg by mouth 3 (three) times a week. Monday, Wednesday and Friday 08/19/15  Yes Florencia Reasons, MD  clopidogrel (PLAVIX) 75 MG tablet Take 75 mg by mouth daily.   Yes [provider]  insulin glargine (LANTUS) 100 UNIT/ML injection Inject 12 Units into the skin daily.    Yes [provider]  lisinopril (PRINIVIL,ZESTRIL) 10 MG tablet Take 10 mg by mouth daily.   Yes [provider]  loperamide (IMODIUM A-D) 2 MG tablet Take 2 mg by mouth 4 (four) times daily as needed for diarrhea or loose stools.   Yes [provider]  metFORMIN (GLUCOPHAGE) 500 MG tablet Take 500 mg by mouth daily with breakfast.  05/10/13  Yes [provider]  metoprolol tartrate (LOPRESSOR) 25 MG tablet Take  0.5 tablets (12.5 mg total) by mouth 2 (two) times daily. Patient taking differently: Take 25 mg by mouth 2 (two) times daily.  08/19/15  Yes Florencia Reasons, MD  repaglinide (PRANDIN) 0.5 MG tablet Take 0.25 mg by mouth 3 (three) times daily before meals.   Yes [provider]  simvastatin (ZOCOR) 20 MG tablet Take 20 mg by mouth at bedtime.     Yes [provider]  tamsulosin (FLOMAX) 0.4 MG CAPS capsule Take 2 capsules (0.8 mg total) by mouth  daily. 08/19/15  Yes Florencia Reasons, MD  cephALEXin (KEFLEX) 500 MG capsule Take 1 capsule (500 mg total) by mouth 2 (two) times daily. Patient not taking: Reported on 06/22/2017 09/25/15   Hosie Poisson, MD  saccharomyces boulardii (FLORASTOR) 250 MG capsule Take 1 capsule (250 mg total) by mouth 2 (two) times daily. Patient not taking: Reported on 06/22/2017 09/18/13   Thurnell Lose, MD                                                                                                                                    Past Surgical History Past Surgical History:  Procedure Laterality Date  . ANKLE FUSION Left   . URETHRA SURGERY     Family History Family History  Problem Relation Age of Onset  . Hypertension Mother   . Hypertension Father     Social History Social History   Tobacco Use  . Smoking status: Former Smoker    Packs/day: 2.00    Years: 35.00    Pack years: 70.00    Last attempt to quit: 06/03/1991    Years since quitting: 26.0  . Smokeless tobacco: Former Systems developer    Quit date: 06/26/1990  Substance Use Topics  . Alcohol use: No  . Drug use: No   Allergies Actos [pioglitazone hydrochloride]; Hydrochlorothiazide; and Penicillins  Review of Systems Review of Systems All other systems are reviewed and are negative for acute change except as noted in the HPI  Physical Exam Vital Signs  I have reviewed the triage vital signs BP (!) 182/56 (BP Location: Left Arm)   Pulse 71   Temp 98.1 F (36.7 C) (Oral)   Resp 18   Ht 5\' 9"  (1.753 m)   Wt 79.8 kg (176 lb)   SpO2 100%   BMI 25.99 kg/m   Physical Exam  Constitutional: He is oriented to person, place, and time. He appears well-developed and well-nourished. No distress.  HENT:  Head: Normocephalic and atraumatic.  Nose: Nose normal.  Eyes: Conjunctivae and EOM are normal. Pupils are equal, round, and reactive to light. Right eye exhibits no discharge. Left eye exhibits no discharge. No scleral icterus.  Neck: Normal  range of motion. Neck supple.  Cardiovascular: Normal rate and regular rhythm. Exam reveals no gallop and no friction rub.  No murmur heard. Pulses:      Dorsalis pedis pulses are 1+ on  the right side, and 1+ on the left side.  Pulmonary/Chest: Effort normal and breath sounds normal. No stridor. No respiratory distress. He has no rales.  Abdominal: Soft. He exhibits no distension. There is no tenderness.  Genitourinary:     Musculoskeletal: He exhibits no edema or tenderness.  Neurological: He is alert and oriented to person, place, and time.  Residual RLE weakness (baseline per patient). Otherwise 5/5 strength.  Skin: Skin is warm and dry. No rash noted. He is not diaphoretic. No erythema.  Psychiatric: He has a normal mood and affect.  Vitals reviewed.   ED Results and Treatments Labs (all labs ordered are listed, but only abnormal results are displayed) Labs Reviewed  CBC WITH DIFFERENTIAL/PLATELET - Abnormal; Notable for the following components:      Result Value   WBC 15.2 (*)    Hemoglobin 17.5 (*)    Platelets 122 (*)    Neutro Abs 12.9 (*)    All other components within normal limits  BASIC METABOLIC PANEL - Abnormal; Notable for the following components:   Chloride 96 (*)    Glucose, Bld 208 (*)    BUN 33 (*)    Creatinine, Ser 1.88 (*)    Calcium 8.8 (*)    GFR calc non Af Amer 33 (*)    GFR calc Af Amer 38 (*)    Anion gap 17 (*)    All other components within normal limits  URINALYSIS, ROUTINE W REFLEX MICROSCOPIC - Abnormal; Notable for the following components:   APPearance HAZY (*)    Glucose, UA 50 (*)    Hgb urine dipstick LARGE (*)    Ketones, ur 5 (*)    Protein, ur 100 (*)    Leukocytes, UA MODERATE (*)    All other components within normal limits  URINE CULTURE  CULTURE, BLOOD (ROUTINE X 2)  CULTURE, BLOOD (ROUTINE X 2)  CK                                                                                                                           EKG  EKG Interpretation  Date/Time:  Tuesday June 22 2017 17:01:35 EST Ventricular Rate:  81 PR Interval:  178 QRS Duration: 134 QT Interval:  408 QTC Calculation: 473 R Axis:   -54 Text Interpretation:  Normal sinus rhythm Right bundle branch block Left anterior fascicular block ** Bifascicular block ** Septal infarct , age undetermined Abnormal ECG No significant change since last tracing Confirmed by Addison Lank [phone]) on 06/22/2017 5:23:10 PM      Radiology No results found. Pertinent labs & imaging results that were available during my care of the patient were reviewed by me and considered in my medical decision making (see chart for details).  Medications Ordered in ED Medications  cefTRIAXone (ROCEPHIN) 1 g in dextrose 5 % 50 mL IVPB (not administered)  sodium chloride 0.9 % bolus 1,000 mL (not administered)  sodium chloride 0.9 % bolus  1,000 mL (0 mLs Intravenous Stopped 06/22/17 1859)  metoprolol tartrate (LOPRESSOR) tablet 12.5 mg (12.5 mg Oral Given 06/22/17 1950)  gi cocktail (Maalox,Lidocaine,Donnatal) (30 mLs Oral Given 06/22/17 1951)                                                                                                                                    Procedures Procedures  (including critical care time)  Medical Decision Making / ED Course I have reviewed the nursing notes for this encounter and the patient's prior records (if available in EHR or on provided paperwork).    Patient here with generalized fatigue.  No focal weakness.  No trauma or falls.  On exam he is got residual deficits from prior stroke but no new or acute changes.  Patient is unable to stand.  Patient has 1+ DP pulses bilaterally.  Good cap refill.  There may be component of neuropraxia from prolonged sitting.  Labs revealed evidence of AK I with leukocytosis and evidence of hemoconcentration on the CBC concerning for dehydration.  Given the patient's significant urologic  complications, bladder scan was obtained which revealed 400 cc of fluid in the bladder concerning for retention.  The patient has a small urethra that required a pediatric catheter that was left in place.  UA with hematuria, leukocytes and too numerous to count white blood cell counts.  Empiric Rocephin was started for possible infection and culture sent.  Case was discussed with urology who will evaluate the patient in the emergency department for dilatation.  Case was also discussed with hospitalist for admission and further management.  Final Clinical Impression(s) / ED Diagnoses Final diagnoses:  Weakness  AKI (acute kidney injury) ([place])  Dehydration  H/O urethral stricture  Urinary retention      This chart was dictated using voice recognition software.  Despite best efforts to proofread,  errors can occur which can change the documentation meaning.   Fatima Blank, MD 06/22/17 2348

## 2017-06-22 NOTE — ED Triage Notes (Signed)
Pt found on toilet at 1500, on toilet since 5am per patient. Patient states he was "too weak" to stand up. Patient found by daughter after not being able to reach him. Patient states chronic UTI's and states "this feels the same".

## 2017-06-22 NOTE — ED Notes (Signed)
Pt bladder scanned. 446mls noted.

## 2017-06-22 NOTE — Consult Note (Signed)
Urology Consult Note   Requesting Attending Physician:  Fatima Blank, * Service Providing Consult: Urology  Consulting Attending: Jeffie Pollock   Reason for Consult:  Urethral stricture  HPI: [name] is seen in consultation for reasons noted above at the request of [place], * for evaluation of urethral stricture and difficult catheterization .  This is a 77 y.o. male with hx of afib, htn, bcell lymphoma, bxo, and hx of urethral stricture who presents after generalized fatigue and inability to stand after trying to void this morning. Found by his daughter and brought to [facility] Ed.   Bladder scan 475 upon presentation. Noted to have stenosed urethral meatus with inability to pass foley catheter. Pediatric, 29fr catheter, placed with delivery of urine.   Work up while in the Ed revealed Cr of 1.88 (baseline 0.7). WBC 15, U/a with large blood, moderate LE, negative nitrites and TNTC WBC, no bacteria seen .  OF note, patient had a circumcision in the past by Dr. Jeffie Pollock. Has a known pan urethral stricture which was dilated in 2012. The patient was on a self calibration regimen at home but has stopped performing dilations as of the past few months.    Past Medical History: Past Medical History:  Diagnosis Date  . A-fib ([place])   . B-cell lymphoma ([place]) 10/09/2011  . B-cell lymphoma ([place])   . Cataract   . Charcot-Marie-Tooth disease   . Diabetes mellitus   . Hypertension   . 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 SIRS (systemic inflammatory response syndrome) ([place]) 09/2015  . Stroke [facility])     Past Surgical History:  Past Surgical History:  Procedure Laterality Date  . ANKLE FUSION Left   . URETHRA SURGERY      Medication: Current Facility-Administered Medications  Medication Dose Route Frequency Provider Last Rate Last Dose  . cefTRIAXone (ROCEPHIN) 1 g in dextrose 5 % 50 mL IVPB  1 g Intravenous Once Cardama, Grayce Sessions, MD      . sodium chloride 0.9 % bolus 1,000 mL  1,000 mL Intravenous Once Cardama, Grayce Sessions, MD       Current Outpatient Medications  Medication Sig Dispense Refill  . aspirin EC 81 MG EC tablet Take 1 tablet (81 mg total) by mouth daily. (Patient taking differently: Take 81 mg by mouth 3 (three) times a week. Monday, Wednesday and Friday) 30 tablet 0  . clopidogrel (PLAVIX) 75 MG tablet Take 75 mg by mouth daily.    . insulin glargine (LANTUS) 100 UNIT/ML injection Inject 12 Units into the skin daily.     [name] lisinopril (PRINIVIL,ZESTRIL) 10 MG tablet Take 10 mg by mouth daily.    [name] loperamide (IMODIUM A-D) 2 MG tablet Take 2 mg by mouth 4 (four) times daily as needed for diarrhea or loose stools.    . metFORMIN (GLUCOPHAGE) 500 MG tablet Take 500 mg by mouth daily with breakfast.     . metoprolol tartrate (LOPRESSOR) 25 MG tablet Take 0.5 tablets (12.5 mg total) by mouth 2 (two) times daily. (Patient taking differently: Take 25 mg by mouth 2 (two) times daily. ) 60 tablet 0  . repaglinide (PRANDIN) 0.5 MG tablet Take 0.25 mg by mouth 3 (three) times daily before meals.    . simvastatin (ZOCOR) 20 MG tablet Take 20 mg by mouth at bedtime.      . tamsulosin (FLOMAX) 0.4 MG CAPS capsule Take 2 capsules (  0.8 mg total) by mouth daily. 30 capsule 0  . cephALEXin (KEFLEX) 500 MG capsule Take 1 capsule (500 mg total) by mouth 2 (two) times daily. (Patient not taking: Reported on 06/22/2017) 10 capsule 0  . saccharomyces boulardii (FLORASTOR) 250 MG capsule Take 1 capsule (250 mg total) by mouth 2 (two) times daily. (Patient not taking: Reported on 06/22/2017) 30 capsule 0    Allergies: Allergies  Allergen Reactions  . Actos [Pioglitazone Hydrochloride] Other (See Comments)    Afib  . Hydrochlorothiazide Other (See Comments)    Lower potassium to low  . Penicillins Rash    Tolerated cefepime, ceftriaxone    Social History: Social History   Tobacco Use  . Smoking status: Former  Smoker    Packs/day: 2.00    Years: 35.00    Pack years: 70.00    Last attempt to quit: 06/03/1991    Years since quitting: 26.0  . Smokeless tobacco: Former Systems developer    Quit date: 06/26/1990  Substance Use Topics  . Alcohol use: No  . Drug use: No    Family History Family History  Problem Relation Age of Onset  . Hypertension Mother   . Hypertension Father     Review of Systems 10 systems were reviewed and are negative except as noted specifically in the HPI.  Objective   Vital signs in last 24 hours: BP (!) 182/56 (BP Location: Left Arm)   Pulse 71   Temp 98.1 F (36.7 C) (Oral)   Resp 18   Ht 5\' 9"  (1.753 m)   Wt 79.8 kg (176 lb)   SpO2 100%   BMI 25.99 kg/m   Physical Exam General: NAD, A&O, resting, appropriate HEENT: [place]/AT, EOMI, MMM Pulmonary: Normal work of breathing Cardiovascular: HDS, adequate peripheral perfusion Abdomen: Soft, NTTP, nondistended, . GU: 55fr catheter in place  Extremities: warm and well perfused Neuro: Appropriate, no focal neurological deficits  Most Recent Labs: Lab Results  Component Value Date   WBC 15.2 (H) 06/22/2017   HGB 17.5 (H) 06/22/2017   HCT 50.0 06/22/2017   PLT 122 (L) 06/22/2017    Lab Results  Component Value Date   NA 136 06/22/2017   K 4.5 06/22/2017   CL 96 (L) 06/22/2017   CO2 23 06/22/2017   BUN 33 (H) 06/22/2017   CREATININE 1.88 (H) 06/22/2017   CALCIUM 8.8 (L) 06/22/2017   MG 1.9 08/17/2015    Lab Results  Component Value Date   INR 1.11 08/15/2015   APTT 29 08/15/2015     IMAGING: No results found.  ------  Assessment:  77 y.o. male with hx of BXO and pan urethral stricture. Recently stopped self calibration. Presents with fatigue after trying to void. 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   Cr elevated to 1.88, unclear if this is a result of high pressure voiding or pre-renal causes. Recommend renal ultrasound to r/o silent hydronephrosis. Also  please culture urine given dirty U/A.   Successful urethral dilation with insertion of 31fr catheter. Please see separate dictation for urethral dilation.   Recommendations: - Continue foley catheter in place for 48 hours, OK to trial of void after that time  - Recommend patient perform self calibration at home with 16fr catheter, once a day after foley is removed  - Renal ultrasound to r/o hydronephrosis given elevated Cr and some concern for high pressure voiding  - Culture urine, abx as directed by culture data -  patient should follow up with Dr. Jeffie Pollock 2-4 weeks after discharge.     Thank you for this consult. Please contact the urology consult pager with any further questions/concerns.

## 2017-06-23 ENCOUNTER — Observation Stay (HOSPITAL_COMMUNITY): Payer: Medicare Other

## 2017-06-23 DIAGNOSIS — E876 Hypokalemia: Secondary | ICD-10-CM | POA: Diagnosis present

## 2017-06-23 DIAGNOSIS — Z87448 Personal history of other diseases of urinary system: Secondary | ICD-10-CM | POA: Diagnosis not present

## 2017-06-23 DIAGNOSIS — E1165 Type 2 diabetes mellitus with hyperglycemia: Secondary | ICD-10-CM | POA: Diagnosis present

## 2017-06-23 DIAGNOSIS — E860 Dehydration: Secondary | ICD-10-CM | POA: Diagnosis present

## 2017-06-23 DIAGNOSIS — T796XXS Traumatic ischemia of muscle, sequela: Secondary | ICD-10-CM | POA: Diagnosis not present

## 2017-06-23 DIAGNOSIS — R5381 Other malaise: Secondary | ICD-10-CM | POA: Diagnosis not present

## 2017-06-23 DIAGNOSIS — E1142 Type 2 diabetes mellitus with diabetic polyneuropathy: Secondary | ICD-10-CM | POA: Diagnosis present

## 2017-06-23 DIAGNOSIS — R159 Full incontinence of feces: Secondary | ICD-10-CM | POA: Diagnosis present

## 2017-06-23 DIAGNOSIS — N401 Enlarged prostate with lower urinary tract symptoms: Secondary | ICD-10-CM | POA: Diagnosis present

## 2017-06-23 DIAGNOSIS — J040 Acute laryngitis: Secondary | ICD-10-CM | POA: Diagnosis not present

## 2017-06-23 DIAGNOSIS — D62 Acute posthemorrhagic anemia: Secondary | ICD-10-CM | POA: Diagnosis not present

## 2017-06-23 DIAGNOSIS — E46 Unspecified protein-calorie malnutrition: Secondary | ICD-10-CM | POA: Diagnosis not present

## 2017-06-23 DIAGNOSIS — N179 Acute kidney failure, unspecified: Secondary | ICD-10-CM | POA: Diagnosis present

## 2017-06-23 DIAGNOSIS — I10 Essential (primary) hypertension: Secondary | ICD-10-CM | POA: Diagnosis present

## 2017-06-23 DIAGNOSIS — I480 Paroxysmal atrial fibrillation: Secondary | ICD-10-CM | POA: Diagnosis present

## 2017-06-23 DIAGNOSIS — Z8572 Personal history of non-Hodgkin lymphomas: Secondary | ICD-10-CM | POA: Diagnosis not present

## 2017-06-23 DIAGNOSIS — R3915 Urgency of urination: Secondary | ICD-10-CM | POA: Diagnosis not present

## 2017-06-23 DIAGNOSIS — J029 Acute pharyngitis, unspecified: Secondary | ICD-10-CM | POA: Diagnosis not present

## 2017-06-23 DIAGNOSIS — N35911 Unspecified urethral stricture, male, meatal: Secondary | ICD-10-CM | POA: Diagnosis present

## 2017-06-23 DIAGNOSIS — M6282 Rhabdomyolysis: Secondary | ICD-10-CM | POA: Diagnosis present

## 2017-06-23 DIAGNOSIS — Z87440 Personal history of urinary (tract) infections: Secondary | ICD-10-CM | POA: Diagnosis not present

## 2017-06-23 DIAGNOSIS — E785 Hyperlipidemia, unspecified: Secondary | ICD-10-CM | POA: Diagnosis present

## 2017-06-23 DIAGNOSIS — I160 Hypertensive urgency: Secondary | ICD-10-CM | POA: Diagnosis present

## 2017-06-23 DIAGNOSIS — N390 Urinary tract infection, site not specified: Secondary | ICD-10-CM | POA: Diagnosis present

## 2017-06-23 DIAGNOSIS — D72829 Elevated white blood cell count, unspecified: Secondary | ICD-10-CM | POA: Diagnosis not present

## 2017-06-23 DIAGNOSIS — Z923 Personal history of irradiation: Secondary | ICD-10-CM | POA: Diagnosis not present

## 2017-06-23 DIAGNOSIS — K529 Noninfective gastroenteritis and colitis, unspecified: Secondary | ICD-10-CM | POA: Diagnosis present

## 2017-06-23 DIAGNOSIS — Z9221 Personal history of antineoplastic chemotherapy: Secondary | ICD-10-CM | POA: Diagnosis not present

## 2017-06-23 DIAGNOSIS — G600 Hereditary motor and sensory neuropathy: Secondary | ICD-10-CM | POA: Diagnosis present

## 2017-06-23 DIAGNOSIS — J019 Acute sinusitis, unspecified: Secondary | ICD-10-CM | POA: Diagnosis not present

## 2017-06-23 DIAGNOSIS — R339 Retention of urine, unspecified: Secondary | ICD-10-CM | POA: Diagnosis not present

## 2017-06-23 DIAGNOSIS — D696 Thrombocytopenia, unspecified: Secondary | ICD-10-CM | POA: Diagnosis present

## 2017-06-23 DIAGNOSIS — I69351 Hemiplegia and hemiparesis following cerebral infarction affecting right dominant side: Secondary | ICD-10-CM | POA: Diagnosis not present

## 2017-06-23 DIAGNOSIS — R338 Other retention of urine: Secondary | ICD-10-CM | POA: Diagnosis present

## 2017-06-23 DIAGNOSIS — R531 Weakness: Secondary | ICD-10-CM | POA: Diagnosis present

## 2017-06-23 DIAGNOSIS — N39490 Overflow incontinence: Secondary | ICD-10-CM | POA: Diagnosis not present

## 2017-06-23 DIAGNOSIS — R05 Cough: Secondary | ICD-10-CM | POA: Diagnosis not present

## 2017-06-23 DIAGNOSIS — Z87891 Personal history of nicotine dependence: Secondary | ICD-10-CM | POA: Diagnosis not present

## 2017-06-23 LAB — HEMOGLOBIN A1C
Hgb A1c MFr Bld: 7 % — ABNORMAL HIGH (ref 4.8–5.6)
MEAN PLASMA GLUCOSE: 154.2 mg/dL

## 2017-06-23 LAB — COMPREHENSIVE METABOLIC PANEL
ALBUMIN: 2.7 g/dL — AB (ref 3.5–5.0)
ALK PHOS: 50 U/L (ref 38–126)
ALT: 58 U/L (ref 17–63)
AST: 205 U/L — AB (ref 15–41)
Anion gap: 9 (ref 5–15)
BILIRUBIN TOTAL: 0.7 mg/dL (ref 0.3–1.2)
BUN: 39 mg/dL — AB (ref 6–20)
CALCIUM: 7.5 mg/dL — AB (ref 8.9–10.3)
CO2: 19 mmol/L — ABNORMAL LOW (ref 22–32)
Chloride: 108 mmol/L (ref 101–111)
Creatinine, Ser: 1.43 mg/dL — ABNORMAL HIGH (ref 0.61–1.24)
GFR calc Af Amer: 53 mL/min — ABNORMAL LOW (ref >60)
GFR, EST NON AFRICAN AMERICAN: 46 mL/min — AB (ref >60)
GLUCOSE: 199 mg/dL — AB (ref 65–99)
Potassium: 4.4 mmol/L (ref 3.5–5.1)
Sodium: 136 mmol/L (ref 135–145)
TOTAL PROTEIN: 5.2 g/dL — AB (ref 6.5–8.1)

## 2017-06-23 LAB — CK
CK TOTAL: 12430 U/L — AB (ref 49–397)
CK TOTAL: 7663 U/L — AB (ref 49–397)

## 2017-06-23 LAB — CBC
HEMATOCRIT: 41.2 % (ref 39.0–52.0)
Hemoglobin: 14.4 g/dL (ref 13.0–17.0)
MCH: 31.2 pg (ref 26.0–34.0)
MCHC: 35 g/dL (ref 30.0–36.0)
MCV: 89.4 fL (ref 78.0–100.0)
Platelets: 127 10*3/uL — ABNORMAL LOW (ref 150–400)
RBC: 4.61 MIL/uL (ref 4.22–5.81)
RDW: 13.9 % (ref 11.5–15.5)
WBC: 13.3 10*3/uL — AB (ref 4.0–10.5)

## 2017-06-23 LAB — CBG MONITORING, ED
GLUCOSE-CAPILLARY: 157 mg/dL — AB (ref 65–99)
GLUCOSE-CAPILLARY: 194 mg/dL — AB (ref 65–99)
Glucose-Capillary: 205 mg/dL — ABNORMAL HIGH (ref 65–99)
Glucose-Capillary: 127 mg/dL — ABNORMAL HIGH (ref 65–99)

## 2017-06-23 LAB — GLUCOSE, CAPILLARY
GLUCOSE-CAPILLARY: 110 mg/dL — AB (ref 65–99)
GLUCOSE-CAPILLARY: 161 mg/dL — AB (ref 65–99)

## 2017-06-23 LAB — TROPONIN I
Troponin I: 0.15 ng/mL (ref <0.03)
Troponin I: 0.13 ng/mL (ref <0.03)
Troponin I: 0.1 ng/mL (ref <0.03)

## 2017-06-23 MED ORDER — SODIUM CHLORIDE 0.9 % IV BOLUS (SEPSIS)
1000.0000 mL | Freq: Once | INTRAVENOUS | Status: AC
  Administered 2017-06-23: 1000 mL via INTRAVENOUS

## 2017-06-23 MED ORDER — ACETAMINOPHEN 650 MG RE SUPP: 650.0000 mg | Freq: Four times a day (QID) | RECTAL | Status: DC | PRN

## 2017-06-23 MED ORDER — CLOPIDOGREL BISULFATE 75 MG PO TABS
75.0000 mg | ORAL_TABLET | Freq: Every day | ORAL | Status: DC
  Administered 2017-06-23 – 2017-06-28 (×6): 75 mg via ORAL
  Filled 2017-06-23 (×6): qty 1

## 2017-06-23 MED ORDER — LOPERAMIDE HCL 2 MG PO CAPS: 2.0000 mg | ORAL_CAPSULE | Freq: Every day | ORAL | Status: DC | PRN

## 2017-06-23 MED ORDER — ACETAMINOPHEN 325 MG PO TABS: 650.0000 mg | ORAL_TABLET | Freq: Four times a day (QID) | ORAL | Status: DC | PRN

## 2017-06-23 MED ORDER — ASPIRIN EC 81 MG PO TBEC: 81.0000 mg | DELAYED_RELEASE_TABLET | Freq: Every day | ORAL | Status: DC

## 2017-06-23 MED ORDER — ALBUTEROL SULFATE (2.5 MG/3ML) 0.083% IN NEBU: 2.5000 mg | INHALATION_SOLUTION | RESPIRATORY_TRACT | Status: DC | PRN

## 2017-06-23 MED ORDER — DEXTROSE 5 % IV SOLN
1.0000 g | INTRAVENOUS | Status: DC
  Administered 2017-06-23 – 2017-06-27 (×5): 1 g via INTRAVENOUS
  Filled 2017-06-23 (×6): qty 10

## 2017-06-23 MED ORDER — ONDANSETRON HCL 4 MG/2ML IJ SOLN: 4.0000 mg | Freq: Four times a day (QID) | INTRAMUSCULAR | Status: DC | PRN

## 2017-06-23 MED ORDER — TAMSULOSIN HCL 0.4 MG PO CAPS
0.8000 mg | ORAL_CAPSULE | Freq: Every day | ORAL | Status: DC
  Administered 2017-06-23 – 2017-06-28 (×6): 0.8 mg via ORAL
  Filled 2017-06-23 (×6): qty 2

## 2017-06-23 MED ORDER — INSULIN ASPART 100 UNIT/ML ~~LOC~~ SOLN
0.0000 [IU] | Freq: Three times a day (TID) | SUBCUTANEOUS | Status: DC
  Administered 2017-06-23 (×2): 2 [IU] via SUBCUTANEOUS
  Administered 2017-06-24 – 2017-06-25 (×3): 1 [IU] via SUBCUTANEOUS
  Administered 2017-06-25 – 2017-06-27 (×4): 2 [IU] via SUBCUTANEOUS
  Administered 2017-06-27: 5 [IU] via SUBCUTANEOUS
  Administered 2017-06-28: 2 [IU] via SUBCUTANEOUS
  Filled 2017-06-23 (×2): qty 1

## 2017-06-23 MED ORDER — ASPIRIN EC 81 MG PO TBEC
81.0000 mg | DELAYED_RELEASE_TABLET | ORAL | Status: DC
  Administered 2017-06-23 – 2017-06-28 (×3): 81 mg via ORAL
  Filled 2017-06-23 (×3): qty 1

## 2017-06-23 MED ORDER — METOPROLOL TARTRATE 25 MG PO TABS
25.0000 mg | ORAL_TABLET | Freq: Two times a day (BID) | ORAL | Status: DC
  Administered 2017-06-23 – 2017-06-28 (×11): 25 mg via ORAL
  Filled 2017-06-23 (×12): qty 1

## 2017-06-23 MED ORDER — ONDANSETRON HCL 4 MG PO TABS: 4.0000 mg | ORAL_TABLET | Freq: Four times a day (QID) | ORAL | Status: DC | PRN

## 2017-06-23 MED ORDER — HYDROMORPHONE HCL 1 MG/ML IJ SOLN
1.0000 mg | Freq: Once | INTRAMUSCULAR | Status: AC
  Administered 2017-06-23: 0.5 mg via INTRAVENOUS
  Filled 2017-06-23: qty 1

## 2017-06-23 MED ORDER — INSULIN GLARGINE 100 UNIT/ML ~~LOC~~ SOLN
12.0000 [IU] | Freq: Every day | SUBCUTANEOUS | Status: DC
  Administered 2017-06-23 – 2017-06-28 (×6): 12 [IU] via SUBCUTANEOUS
  Filled 2017-06-23 (×6): qty 0.12

## 2017-06-23 MED ORDER — ENOXAPARIN SODIUM 40 MG/0.4ML ~~LOC~~ SOLN
40.0000 mg | SUBCUTANEOUS | Status: DC
  Administered 2017-06-23 – 2017-06-28 (×6): 40 mg via SUBCUTANEOUS
  Filled 2017-06-23 (×7): qty 0.4

## 2017-06-23 MED ORDER — HYDRALAZINE HCL 20 MG/ML IJ SOLN: 10.0000 mg | INTRAMUSCULAR | Status: DC | PRN

## 2017-06-23 MED ORDER — SODIUM CHLORIDE 0.9 % IV SOLN
INTRAVENOUS | Status: DC
  Administered 2017-06-23 – 2017-06-24 (×5): via INTRAVENOUS
  Administered 2017-06-25: 1000 mL via INTRAVENOUS
  Administered 2017-06-26 – 2017-06-27 (×2): via INTRAVENOUS

## 2017-06-23 MED ORDER — LOPERAMIDE HCL 2 MG PO CAPS: 2.0000 mg | ORAL_CAPSULE | ORAL | Status: DC | PRN

## 2017-06-23 MED ORDER — FENTANYL CITRATE (PF) 100 MCG/2ML IJ SOLN: 25.0000 ug | INTRAMUSCULAR | Status: DC | PRN

## 2017-06-23 MED ORDER — INSULIN ASPART 100 UNIT/ML ~~LOC~~ SOLN
0.0000 [IU] | Freq: Every day | SUBCUTANEOUS | Status: DC
Start: 2017-06-23 — End: 2017-06-28
  Administered 2017-06-23 – 2017-06-27 (×3): 2 [IU] via SUBCUTANEOUS
  Filled 2017-06-23: qty 1

## 2017-06-23 NOTE — ED Notes (Signed)
Attempted report 

## 2017-06-23 NOTE — ED Notes (Signed)
CRITICAL VALUE ALERT  Critical Value: Troponin 0.13  Date & Time Notied: 06/23/17 9:07am  Provider Notified: Dr. Eliseo Squires  Orders Received/Actions taken:

## 2017-06-23 NOTE — ED Notes (Signed)
Urology MD at bedside

## 2017-06-23 NOTE — ED Notes (Signed)
Ordered breakfast tray  

## 2017-06-23 NOTE — Progress Notes (Signed)
[name] is a 77 y.o. male patient admitted from ED awake, alert - oriented  X 4 - no acute distress noted.  VSS - Blood pressure (!) 166/46, pulse 62, temperature 97.9 F (36.6 C), temperature source Oral, resp. rate 16, height 5\' 9"  (1.753 m), weight 79.8 kg (176 lb), SpO2 99 %.    IV in place, occlusive dsg intact without redness.  Orientation to room, and floor completed with information packet given to patient/family.  Patient declined safety video at this time.  Admission INP armband ID verified with patient/family, and in place.   SR up x 2, fall assessment complete, with patient and family able to verbalize understanding of risk associated with falls, and verbalized understanding to call nsg before up out of bed.  Call light within reach, patient able to voice, and demonstrate understanding.  Skin, clean-dry- intact without evidence of bruising, or skin tears.   No evidence of skin break down noted on exam.     Will cont to eval and treat per MD orders.  Richardean Chimera, RN 06/23/2017 4:20 PM

## 2017-06-23 NOTE — ED Notes (Signed)
X-ray at bedside

## 2017-06-23 NOTE — Procedures (Signed)
Foley Catheter Placement Note  Indications: 77 y.o. male with hx of BXO and urethral stricture. Now with difficultly passing catheter   Pre-operative Diagnosis: Urinary retention  Post-operative Diagnosis: Same  Surgeon: Alla Feeling, MD, MD  Assistants: None  Procedure Details  Patient was placed in the supine position, prepped with Betadine and draped in the usual sterile fashion.  We injected lidocaine jelly per urethra prior to the procedure.  Lucianne Lei buren sounds were used to gently dilate the urethral meatus to 2fr. No bleeding was noted. We then inserted a 80 French straight tip catheter per urethra which easily passed into the bladder without any resistance at the prostatic urethra.  We achieved return of clear yellow urine and then proceeded to insert 10 mL of sterile water into the Foley balloon.  The catheter was attached to a drainage bag and secured with a StatLock.                  Complications: None; patient tolerated the procedure well.  Plan:   1.  See consult note for details        Attending Attestation: Dr. Jeffie Pollock was available.

## 2017-06-23 NOTE — ED Notes (Signed)
Pt given other 0.5mg  of Dilaudid from previous administration.

## 2017-06-23 NOTE — Progress Notes (Signed)
Pharmacy Antibiotic Note  [name] is a 77 y.o. male admitted on 06/22/2017 with UTI.  Pharmacy has been consulted for Rocephin dosing.  Plan: Rocephin 1g IV Q24H.  Pharmacy will sign off.  Height: 5\' 9"  (175.3 cm) Weight: 176 lb (79.8 kg) IBW/kg (Calculated) : 70.7  Temp (24hrs), Avg:98.1 F (36.7 C), Min:98.1 F (36.7 C), Max:98.1 F (36.7 C)  Recent Labs  Lab 06/22/17 1740  WBC 15.2*  CREATININE 1.88*    Estimated Creatinine Clearance: 33.4 mL/min (A) (by C-G formula based on SCr of 1.88 mg/dL (H)).    Allergies  Allergen Reactions  . Actos [Pioglitazone Hydrochloride] Other (See Comments)    Afib  . Hydrochlorothiazide Other (See Comments)    Lower potassium to low  . Penicillins Rash    Tolerated cefepime, ceftriaxone    Thank you for allowing pharmacy to be a part of this patient's care.  Wynona Neat, PharmD, BCPS  06/23/2017 1:38 AM

## 2017-06-23 NOTE — Progress Notes (Addendum)
PROGRESS NOTE    [name]  JXB:[id number] DOB: [birth year] DOA: 06/22/2017 PCP: Alroy Dust, L.Marlou Sa, MD   Outpatient Specialists:     Brief Narrative:  [name] is a 77 y.o. male with medical history significant of  HTN, afib, NHL s/p chemo/rad, DM type II, urological issues including urethral stricture s/p dilations; who presents with complaints of weakness unable to get off the bathroom toilet of his home since approximately 500.   He was found by his daughter around 1500.  Prior to onset of symptoms patient had been in his normal state of health and had watched the [employer] celebrations on his television at home.  At baseline patient is able to complete all of his ADLs and lives alone.  While getting up to use the bathroom this morning he reports stumbling, but denies having any falls.  While using the bathroom he complained of some associated symptoms of dysuria.  When trying to get up after using the restroom noted that he was unable to stand and sat there for approximately 10 hours.  He reports missing taking all of his medications and not having anything to eat or drink.  Patient has a known history of urological issues for which he patient reports started after being circumcised in his 45s.  Reports requiring multiple dilations in the past, and was given a device to dilate himself at home, but he has not utilized this in several years.  Previously followed by Dr. Jeffie Pollock, but last seen in 2012.  He also incidentally notes that he previously had been on blood thinners but had several issues with bleeding and declined being put on Eliquis.  He was apparently placed on Plavix instead.  Patient complains of developing allergy to Plavix for which he had self decrease dosage only taking the medicine twice per week, and reports completely stopping aspirin altogether approximately 3 months ago due to nosebleeds..   Assessment & Plan:   Principal Problem:   Rhabdomyolysis Active  Problems:   A-fib (HCC)   Hypertension   Thrombocytopenia (HCC)   Weakness   PAF (paroxysmal atrial fibrillation) (HCC)   Diabetes mellitus type 2, uncontrolled (HCC)   Personal history of urethral stricture   Rhabdomyolysis: Acute -CPK came back elevated at 12,430.   -Urinalysis positive for large hemoglobin but negative for RBCs suspect likely myoglobin.  - Aggressive IV fluids of normal saline liter of IV fluids then placed rate of 150 mL/h as tolerated - Daily CPK -daily BMP  Acute renal failure, ureteral stricture, urinary retention: Acute.   -Patient with previous history of ureteral stricture requiring dilations.   -Patient's baseline creatinine previously within normal limits - Hold nephrotoxic agents - Monitor kidney function - renal U/S w/o blockage but shows some cortical thinning  Suspected urinary tract infection: Acute.   - Follow-up urine culture - Continue Rocephin, and de-escalate when medically appropriate  Leukocytosis: Acute. WBC elevated at 15.2 on admission suspect secondary to above. - trend  Hypertensive urgency: Patient had not taken his normal home blood pressure medications. - Continue metoprolol - Hydralazine IV prn  History of CVA with residual deficit: Patient noted to have right-sided weakness as result of previous stroke that is unchanged.  Patient has self changed doses of Plavix  to twice weekly and had completely stopped aspirin although previously supposed to be on aspirin 3 times weekly. - Continue aspirin and Plavix  Generalized weakness: Acute.  Patient reports stumbling around prior to sitting on the toilet this a.m.,  but was unable to ambulate thereafter due to weakness. - Physical therapy to eval and treat  -from ?UTI or quite possibly another CVA as patient was not taking plavix regularly at home  Diabetes mellitus type 2 :  - Check hemoglobin A1c - Hold metformin and repaglinide  - Continue Lantus  - CBGs q. before meals  and at bedtime with sensitive SSI  Mild elevation of troponin -trending down  Paroxysmal atrial fibrillation: Patient currently in sinus rhythm. Chadsvasc score = 6.  Patient apparently refused being placed on Eliquis due to he reports his history of easy bleeding. - Continue to monitor  Thrombocytopenia: Chronic. Platelet count 122 on admission - Continue to monitor  Hyperlipidemia - Held simvastatin pending CPK  Diarrhea: Patient notes history of chronic diarrhea with incontinence of stools for at least the last year. - Continue Imodium as needed  BPH - Continue Flomax  History of non-Hodgkin's lymphoma: Patient reports being status post radiation and chemotherapy.  uretheral stricture s/p dilatation Per urology:  - Continue foley catheter in place for 48 hours, OK to trial of void after that time  - Recommend patient perform self calibration at home with 21fr catheter, once a day after foley is removed  - Renal ultrasound to r/o hydronephrosis given elevated Cr and some concern for high pressure voiding  - Culture urine, abx as directed by culture data - patient should follow up with Dr. Jeffie Pollock 2-4 weeks after discharge    DVT prophylaxis:  Lovenox   Code Status: Full Code   Family Communication: At bedside  Disposition Plan:  PT Eval   Consultants:   urology   Subjective: [name] hurts from sitting so long  Objective: Vitals:   06/23/17 0600 06/23/17 0630 06/23/17 0747 06/23/17 0900  BP: (!) 159/65 (!) 154/62 (!) 132/51 (!) 136/52  Pulse: 61 61 74 61  Resp:  16  16  Temp:      TempSrc:      SpO2: 96% 98% 99% 96%  Weight:      Height:        Intake/Output Summary (Last 24 hours) at 06/23/2017 0937 Last data filed at 06/23/2017 0457 Gross per 24 hour  Intake 2050 ml  Output 420 ml  Net 1630 ml   Filed Weights   06/22/17 1649  Weight: 79.8 kg (176 lb)    Examination:  General exam: appears stated agge Respiratory system: Clear to  auscultation. Respiratory effort normal. Cardiovascular system: S1 & S2 heard, RRR. No JVD, murmurs, rubs, gallops or clicks. No pedal edema. Gastrointestinal system: Abdomen is nondistended, soft and nontender. No organomegaly or masses felt. Normal bowel sounds heard. Central nervous system: mild right sided weakness Extremities: Symmetric 5 x 5 power. Skin: No rashes, lesions or ulcers Psychiatry: Judgement and insight appear normal. Mood & affect appropriate.     Data Reviewed: I have personally reviewed following labs and imaging studies  CBC: Recent Labs  Lab 06/22/17 1740 06/23/17 0325  WBC 15.2* 13.3*  NEUTROABS 12.9*  --   HGB 17.5* 14.4  HCT 50.0 41.2  MCV 89.8 89.4  PLT 122* 784*   Basic Metabolic Panel: Recent Labs  Lab 06/22/17 1740 06/23/17 0325  NA 136 136  K 4.5 4.4  CL 96* 108  CO2 23 19*  GLUCOSE 208* 199*  BUN 33* 39*  CREATININE 1.88* 1.43*  CALCIUM 8.8* 7.5*   GFR: Estimated Creatinine Clearance: 43.9 mL/min (A) (by C-G formula based on SCr of 1.43 mg/dL (H)). Liver  Function Tests: Recent Labs  Lab 06/23/17 0325  AST 205*  ALT 58  ALKPHOS 50  BILITOT 0.7  PROT 5.2*  ALBUMIN 2.7*   No results for input(s): LIPASE, AMYLASE in the last 168 hours. No results for input(s): AMMONIA in the last 168 hours. Coagulation Profile: No results for input(s): INR, PROTIME in the last 168 hours. Cardiac Enzymes: Recent Labs  Lab 06/22/17 1740 06/23/17 0330 06/23/17 0656  CKTOTAL 12,430*  --   --   TROPONINI  --  0.15* 0.13*   BNP (last 3 results) No results for input(s): PROBNP in the last 8760 hours. HbA1C: Recent Labs    06/23/17 0331  HGBA1C 7.0*   CBG: Recent Labs  Lab 06/23/17 0319 06/23/17 0609 06/23/17 0739  GLUCAP 205* 127* 157*   Lipid Profile: No results for input(s): CHOL, HDL, LDLCALC, TRIG, CHOLHDL, LDLDIRECT in the last 72 hours. Thyroid Function Tests: No results for input(s): TSH, T4TOTAL, FREET4, T3FREE, THYROIDAB  in the last 72 hours. Anemia Panel: No results for input(s): VITAMINB12, FOLATE, FERRITIN, TIBC, IRON, RETICCTPCT in the last 72 hours. Urine analysis:    Component Value Date/Time   COLORURINE YELLOW 06/22/2017 2207   APPEARANCEUR HAZY (A) 06/22/2017 2207   LABSPEC 1.011 06/22/2017 2207   PHURINE 5.0 06/22/2017 2207   GLUCOSEU 50 (A) 06/22/2017 2207   HGBUR LARGE (A) 06/22/2017 2207   BILIRUBINUR NEGATIVE 06/22/2017 2207   KETONESUR 5 (A) 06/22/2017 2207   PROTEINUR 100 (A) 06/22/2017 2207   UROBILINOGEN 0.2 05/29/2014 2125   NITRITE NEGATIVE 06/22/2017 2207   LEUKOCYTESUR MODERATE (A) 06/22/2017 2207   Sepsis Labs: @LABRCNTIP (procalcitonin:4,lacticidven:4)  )No results found for this or any previous visit (from the past 240 hour(s)).    Anti-infectives (From admission, onward)   Start     Dose/Rate Route Frequency Ordered Stop   06/23/17 2200  cefTRIAXone (ROCEPHIN) 1 g in dextrose 5 % 50 mL IVPB     1 g 100 mL/hr over 30 Minutes Intravenous Every 24 hours 06/23/17 0139     06/22/17 2300  cefTRIAXone (ROCEPHIN) 1 g in dextrose 5 % 50 mL IVPB     1 g 100 mL/hr over 30 Minutes Intravenous  Once 06/22/17 2254 06/23/17 0032       Radiology Studies: US Renal  Result Date: 06/23/2017 CLINICAL DATA:  Acute onset of renal failure. EXAM: RENAL / URINARY TRACT ULTRASOUND COMPLETE COMPARISON:  PET/CT performed 12/10/2009 FINDINGS: Right Kidney: Length: 10.6 cm. Echogenicity within normal limits. A 2.2 cm cyst is noted at the upper pole of the right kidney. No hydronephrosis visualized. Left Kidney: Length: 12.0 cm. Echogenicity within normal limits. Minimal left renal cortical thinning is noted. No mass or hydronephrosis visualized. Bladder: Decompressed, with a Foley catheter in place. Stones are seen dependently within the gallbladder. IMPRESSION: 1. No evidence of hydronephrosis. 2. Minimal left renal cortical thinning may reflect chronic renal disease. 3. Right renal cyst noted. 4.  Cholelithiasis noted. Gallbladder otherwise grossly unremarkable in appearance. Electronically Signed   By: Garald Balding M.D.   On: 06/23/2017 02:38   Dg Chest Port 1 View  Result Date: 06/23/2017 CLINICAL DATA:  Patient is too weak to stand up. EXAM: PORTABLE CHEST 1 VIEW COMPARISON:  09/23/2015 FINDINGS: Shallow inspiration. Heart size and pulmonary vascularity are normal. Linear scarring in the left mid lung is unchanged since prior study. No airspace disease or consolidation. No blunting of costophrenic angles. No pneumothorax. Calcification of the aorta. IMPRESSION: No evidence of active pulmonary disease.  Aortic atherosclerosis. Electronically Signed   By: Lucienne Capers M.D.   On: 06/23/2017 01:54        Scheduled Meds: . aspirin EC  81 mg Oral Once per day on Mon Wed Fri  . clopidogrel  75 mg Oral Daily  . enoxaparin (LOVENOX) injection  40 mg Subcutaneous Q24H  . insulin aspart  0-5 Units Subcutaneous QHS  . insulin aspart  0-9 Units Subcutaneous TID WC  . insulin glargine  12 Units Subcutaneous Daily  . metoprolol tartrate  25 mg Oral BID  . tamsulosin  0.8 mg Oral Daily   Continuous Infusions: . sodium chloride 150 mL/hr at 06/23/17 0454  . cefTRIAXone (ROCEPHIN)  IV       LOS: 0 days    Time spent: 35 min    Geradine Girt, DO [facility] Pager [phone]  If 7PM-7AM, please contact night-coverage [email or web address] Password [facility] 06/23/2017, 9:37 AM

## 2017-06-23 NOTE — ED Notes (Signed)
Pt arrived to floor, alert oriented and placed on monitor.  Pt is SR with PVCs.  Foley clear yellow urine and to straight drain.  Call bell in reach. Family at bedside

## 2017-06-23 NOTE — ED Notes (Signed)
Patient transported to Ultrasound 

## 2017-06-24 ENCOUNTER — Encounter (HOSPITAL_COMMUNITY): Payer: Self-pay | Admitting: General Practice

## 2017-06-24 LAB — COMPREHENSIVE METABOLIC PANEL
ALT: 54 U/L (ref 17–63)
AST: 113 U/L — AB (ref 15–41)
Albumin: 2.3 g/dL — ABNORMAL LOW (ref 3.5–5.0)
Alkaline Phosphatase: 45 U/L (ref 38–126)
Anion gap: 5 (ref 5–15)
BUN: 24 mg/dL — ABNORMAL HIGH (ref 6–20)
CHLORIDE: 112 mmol/L — AB (ref 101–111)
CO2: 23 mmol/L (ref 22–32)
CREATININE: 0.94 mg/dL (ref 0.61–1.24)
Calcium: 7.7 mg/dL — ABNORMAL LOW (ref 8.9–10.3)
GFR calc non Af Amer: >60 mL/min (ref >60)
Glucose, Bld: 147 mg/dL — ABNORMAL HIGH (ref 65–99)
POTASSIUM: 4.1 mmol/L (ref 3.5–5.1)
SODIUM: 140 mmol/L (ref 135–145)
Total Bilirubin: 0.7 mg/dL (ref 0.3–1.2)
Total Protein: 4.8 g/dL — ABNORMAL LOW (ref 6.5–8.1)

## 2017-06-24 LAB — CBC
HCT: 42.9 % (ref 39.0–52.0)
Hemoglobin: 14.3 g/dL (ref 13.0–17.0)
MCH: 30.4 pg (ref 26.0–34.0)
MCHC: 33.3 g/dL (ref 30.0–36.0)
MCV: 91.3 fL (ref 78.0–100.0)
PLATELETS: 107 10*3/uL — AB (ref 150–400)
RBC: 4.7 MIL/uL (ref 4.22–5.81)
RDW: 14 % (ref 11.5–15.5)
WBC: 14.1 10*3/uL — AB (ref 4.0–10.5)

## 2017-06-24 LAB — CK: Total CK: 3446 U/L — ABNORMAL HIGH (ref 49–397)

## 2017-06-24 LAB — GLUCOSE, CAPILLARY
GLUCOSE-CAPILLARY: 137 mg/dL — AB (ref 65–99)
Glucose-Capillary: 113 mg/dL — ABNORMAL HIGH (ref 65–99)
Glucose-Capillary: 143 mg/dL — ABNORMAL HIGH (ref 65–99)
Glucose-Capillary: 123 mg/dL — ABNORMAL HIGH (ref 65–99)

## 2017-06-24 MED ORDER — ALUM & MAG HYDROXIDE-SIMETH 200-200-20 MG/5ML PO SUSP
30.0000 mL | Freq: Once | ORAL | Status: AC
  Administered 2017-06-24: 30 mL via ORAL
  Filled 2017-06-24: qty 30

## 2017-06-24 NOTE — Evaluation (Signed)
Physical Therapy Evaluation Patient Details Name: [name] MRN: [id number] DOB: 11-02-1940 Today's Date: 06/24/2017   History of Present Illness  Pt is a 77 y/o male admitted with profound weakness of sudden onset, workup pending (UTI (-)).  PMH HTN, Afib, NHL s/p chemo/radiation, DM, CVA with R hemiparesis  Clinical Impression  Pt demonstrates global weakness in BLEs and BUEs.  PTA pt was mod I, lived alone, and managed ADLs and IADLs.  Pt is motivated to return to mod I prior level of function and would benefit from high intensity rehab prior to returning home.      Follow Up Recommendations CIR;Supervision/Assistance - 24 hour    Equipment Recommendations  Other (comment)(tbd in next venue)    Recommendations for Other Services Rehab consult;OT consult     Precautions / Restrictions Precautions Precautions: Fall Restrictions Weight Bearing Restrictions: No      Mobility  Bed Mobility Overal bed mobility: Needs Assistance Bed Mobility: Rolling;Supine to Sit;Sit to Supine Rolling: Supervision   Supine to sit: Max assist;HOB elevated Sit to supine: Mod assist   General bed mobility comments: pt requiring heavy max assist to bring trunk into upright position for supine>sit despite HOB elevated and bedrails.    Transfers Overall transfer level: Needs assistance Equipment used: Rolling walker (2 wheeled) Transfers: Sit to/from Stand Sit to Stand: +2 physical assistance;Total assist         General transfer comment: pt unable to come to full standing at RW despite +2 total assist.  Decreased forward weight shift 2/2 pain and fear of falling, profound LE weakness unable to power up from seated position.   Ambulation/Gait             General Gait Details: unsafe during this session  Stairs            Wheelchair Mobility    Modified Rankin (Stroke Patients Only)       Balance Overall balance assessment: Needs assistance Sitting-balance support:  Bilateral upper extremity supported;Feet supported Sitting balance-Leahy Scale: Poor       Standing balance-Leahy Scale: Zero                               Pertinent Vitals/Pain Pain Assessment: No/denies pain    Home Living Family/patient expects to be discharged to:: Private residence Living Arrangements: Alone Available Help at Discharge: Family;Available 24 hours/day(to provide supervision only) Type of Home: House Home Access: Stairs to enter;Ramped entrance Entrance Stairs-Rails: Right Entrance Stairs-Number of Steps: 3 Home Layout: One level Home Equipment: Walker - 2 wheels;Cane - single point;Bedside commode      Prior Function Level of Independence: Independent with assistive device(s)         Comments: PTA pt was indep with RW/SPC, managing bills, mowing grass, grocery shopping, driving, etc.      Hand Dominance   Dominant Hand: Right    Extremity/Trunk Assessment   Upper Extremity Assessment Upper Extremity Assessment: Defer to OT evaluation    Lower Extremity Assessment Lower Extremity Assessment: RLE deficits/detail;LLE deficits/detail RLE Deficits / Details: grossly 3/5 throughout, pain with hip flexion RLE Sensation: decreased light touch(prior history of neuropathy) LLE Deficits / Details: grossly 3+/5, pain with hip flexion LLE Sensation: decreased light touch(baseline neuropathy)    Cervical / Trunk Assessment Cervical / Trunk Assessment: (forward head)  Communication   Communication: No difficulties  Cognition Arousal/Alertness: Awake/alert Behavior During Therapy: WFL for tasks assessed/performed Overall Cognitive Status:  Within Functional Limits for tasks assessed                                 General Comments: cognitive function appears WFL, but will need to further assessment during length of stay      General Comments      Exercises     Assessment/Plan    PT Assessment Patient needs continued  PT services  PT Problem List Decreased strength;Decreased activity tolerance;Decreased balance;Decreased mobility;Decreased knowledge of use of DME;Decreased safety awareness       PT Treatment Interventions DME instruction;Gait training;Functional mobility training;Therapeutic activities;Therapeutic exercise;Balance training;Neuromuscular re-education;Patient/family education    PT Goals (Current goals can be found in the Care Plan section)  Acute Rehab PT Goals Patient Stated Goal: To get stronger so he can go home PT Goal Formulation: With patient Time For Goal Achievement: 07/08/17 Potential to Achieve Goals: Good    Frequency Min 3X/week   Barriers to discharge Inaccessible home environment;Decreased caregiver support pts daughter only able to provide supervision    Co-evaluation               AM-PAC PT "6 Clicks" Daily Activity  Outcome Measure Difficulty turning over in bed (including adjusting bedclothes, sheets and blankets)?: A Little Difficulty moving from lying on back to sitting on the side of the bed? : A Lot Difficulty sitting down on and standing up from a chair with arms (e.g., wheelchair, bedside commode, etc,.)?: A Lot Help needed moving to and from a bed to chair (including a wheelchair)?: Total Help needed walking in hospital room?: Total Help needed climbing 3-5 steps with a railing? : Total 6 Click Score: 10    End of Session   Activity Tolerance: Patient limited by fatigue Patient left: in bed;with call bell/phone within reach;with family/visitor present Nurse Communication: Mobility status;Need for lift equipment PT Visit Diagnosis: Muscle weakness (generalized) (M62.81);History of falling (Z91.81);Difficulty in walking, not elsewhere classified (R26.2)    Time: 1515-1600 PT Time Calculation (min) (ACUTE ONLY): 45 min   Charges:   PT Evaluation $PT Eval Moderate Complexity: 1 Mod PT Treatments $Therapeutic Activity: 23-37 mins   PT G  Codes:        Shann Medal, PT, DPT 06/24/17 4:15 PM

## 2017-06-24 NOTE — Progress Notes (Signed)
PROGRESS NOTE    [name]  WUJ:[id number] DOB: 08-19-1940 DOA: 06/22/2017 PCP: Alroy Dust, L.Marlou Sa, MD   Outpatient Specialists:     Brief Narrative:  [name] is a 77 y.o. male with medical history significant of  HTN, afib, NHL s/p chemo/rad, DM type II, urological issues including urethral stricture s/p dilations; who presents with complaints of weakness unable to get off the bathroom toilet of his home since approximately 500.   He was found by his daughter around 1500.  Prior to onset of symptoms patient had been in his normal state of health and had watched the [employer] celebrations on his television at home.  At baseline patient is able to complete all of his ADLs and lives alone.   High risk for CVA but appears this episode of weakness may be due to UTI.  Suspect d/c in 1-2 days.   Assessment & Plan:   Principal Problem:   Rhabdomyolysis Active Problems:   A-fib (HCC)   Hypertension   Thrombocytopenia (HCC)   Weakness   PAF (paroxysmal atrial fibrillation) (HCC)   Diabetes mellitus type 2, uncontrolled (HCC)   Personal history of urethral stricture   Rhabdomyolysis: Acute -CPK came back elevated at 12,430-- coming down nicely with agressive IVF -Urinalysis positive for large hemoglobin but negative for RBCs suspect likely myoglobin.  - Daily CPK -daily BMP  Acute renal failure, ureteral stricture, urinary retention: Acute.   -Patient with previous history of ureteral stricture requiring dilations.   -Patient's baseline creatinine previously within normal limits - Hold nephrotoxic agents - Monitor kidney function - renal U/S w/o blockage but shows some cortical thinning  Suspected urinary tract infection: Acute.   - Follow-up urine culture - Continue Rocephin, and de-escalate when medically appropriate  Leukocytosis: Acute. WBC elevated at 15.2 on admission suspect secondary to above. - trend  Hypertensive urgency: Patient had not taken his  normal home blood pressure medications. - Continue metoprolol - Hydralazine IV prn  History of CVA with residual deficit: Patient noted to have right-sided weakness as result of previous stroke that is unchanged.  Patient has self changed doses of Plavix  to twice weekly and had completely stopped aspirin although previously supposed to be on aspirin 3 times weekly. - Continue aspirin and Plavix  Generalized weakness: Acute.  Patient reports stumbling around prior to sitting on the toilet this a.m., but was unable to ambulate thereafter due to weakness. - Physical therapy to eval and treat  -from ?UTI or quite possibly another CVA as patient was not taking plavix regularly at home  Diabetes mellitus type 2 :  - Check hemoglobin A1c - Hold metformin and repaglinide  - Continue Lantus  - CBGs q. before meals and at bedtime with sensitive SSI  Mild elevation of troponin -trending down  Paroxysmal atrial fibrillation: Patient currently in sinus rhythm. Chadsvasc score = 6.  Patient apparently refused being placed on Eliquis due to he reports his history of easy bleeding. - Continue to monitor  Thrombocytopenia: Chronic. Platelet count 122 on admission - Continue to monitor  Hyperlipidemia - Held simvastatin pending CPK  Diarrhea: Patient notes history of chronic diarrhea with incontinence of stools for at least the last year. - Continue Imodium as needed  BPH - Continue Flomax  History of non-Hodgkin's lymphoma: Patient reports being status post radiation and chemotherapy.  uretheral stricture s/p dilatation Per urology:  - Continue foley catheter in place for 48 hours, OK to trial of void after that time  -  Recommend patient perform self calibration at home with 69fr catheter, once a day after foley is removed  - Renal ultrasound to r/o hydronephrosis given elevated Cr and some concern for high pressure voiding  - Culture urine, abx as directed by culture data -  patient should follow up with Dr. Jeffie Pollock 2-4 weeks after discharge    DVT prophylaxis:  Lovenox   Code Status: Full Code   Family Communication: At bedside 1/2  Disposition Plan:  PT Eval   Consultants:   Urology    Subjective: Muscles still sore  Objective: Vitals:   06/23/17 1616 06/23/17 2127 06/24/17 0540 06/24/17 0912  BP: (!) 166/46 (!) 160/52 (!) 175/53 (!) 144/55  Pulse: 62 70 69 80  Resp: 16 18 18    Temp: 97.9 F (36.6 C) 98.7 F (37.1 C) 98.2 F (36.8 C)   TempSrc: Oral Oral Oral   SpO2: 99% 100% 99%   Weight:      Height:        Intake/Output Summary (Last 24 hours) at 06/24/2017 1303 Last data filed at 06/24/2017 0911 Gross per 24 hour  Intake 3842.5 ml  Output 2750 ml  Net 1092.5 ml   Filed Weights   06/22/17 1649  Weight: 79.8 kg (176 lb)    Examination:  General exam: NAD- talking on phone Respiratory system: clear Cardiovascular system: rrr Gastrointestinal system: +BS, soft Central nervous system: min right sided weakness, no other focal deficits Psychiatry: mood    Data Reviewed: I have personally reviewed following labs and imaging studies  CBC: Recent Labs  Lab 06/22/17 1740 06/23/17 0325 06/24/17 0505  WBC 15.2* 13.3* 14.1*  NEUTROABS 12.9*  --   --   HGB 17.5* 14.4 14.3  HCT 50.0 41.2 42.9  MCV 89.8 89.4 91.3  PLT 122* 127* 614*   Basic Metabolic Panel: Recent Labs  Lab 06/22/17 1740 06/23/17 0325 06/24/17 0505  NA 136 136 140  K 4.5 4.4 4.1  CL 96* 108 112*  CO2 23 19* 23  GLUCOSE 208* 199* 147*  BUN 33* 39* 24*  CREATININE 1.88* 1.43* 0.94  CALCIUM 8.8* 7.5* 7.7*   GFR: Estimated Creatinine Clearance: 66.9 mL/min (by C-G formula based on SCr of 0.94 mg/dL). Liver Function Tests: Recent Labs  Lab 06/23/17 0325 06/24/17 0505  AST 205* 113*  ALT 58 54  ALKPHOS 50 45  BILITOT 0.7 0.7  PROT 5.2* 4.8*  ALBUMIN 2.7* 2.3*   No results for input(s): LIPASE, AMYLASE in the last 168 hours. No  results for input(s): AMMONIA in the last 168 hours. Coagulation Profile: No results for input(s): INR, PROTIME in the last 168 hours. Cardiac Enzymes: Recent Labs  Lab 06/22/17 1740 06/23/17 0330 06/23/17 0656 06/23/17 1314 06/24/17 0505  CKTOTAL 12,430*  --   --  7,663* 3,446*  TROPONINI  --  0.15* 0.13* 0.10*  --    BNP (last 3 results) No results for input(s): PROBNP in the last 8760 hours. HbA1C: Recent Labs    06/23/17 0331  HGBA1C 7.0*   CBG: Recent Labs  Lab 06/23/17 1129 06/23/17 1735 06/23/17 2125 06/24/17 0751 06/24/17 1153  GLUCAP 194* 110* 161* 113* 137*   Lipid Profile: No results for input(s): CHOL, HDL, LDLCALC, TRIG, CHOLHDL, LDLDIRECT in the last 72 hours. Thyroid Function Tests: No results for input(s): TSH, T4TOTAL, FREET4, T3FREE, THYROIDAB in the last 72 hours. Anemia Panel: No results for input(s): VITAMINB12, FOLATE, FERRITIN, TIBC, IRON, RETICCTPCT in the last 72 hours. Urine analysis:  Component Value Date/Time   COLORURINE YELLOW 06/22/2017 2207   APPEARANCEUR HAZY (A) 06/22/2017 2207   LABSPEC 1.011 06/22/2017 2207   PHURINE 5.0 06/22/2017 2207   GLUCOSEU 50 (A) 06/22/2017 2207   HGBUR LARGE (A) 06/22/2017 2207   BILIRUBINUR NEGATIVE 06/22/2017 2207   KETONESUR 5 (A) 06/22/2017 2207   PROTEINUR 100 (A) 06/22/2017 2207   UROBILINOGEN 0.2 05/29/2014 2125   NITRITE NEGATIVE 06/22/2017 2207   LEUKOCYTESUR MODERATE (A) 06/22/2017 2207     ) Recent Results (from the past 240 hour(s))  Urine culture     Status: None (Preliminary result)   Collection Time: 06/22/17 10:54 PM  Result Value Ref Range Status   Specimen Description URINE, CATHETERIZED  Final   Special Requests NONE  Final   Culture CULTURE REINCUBATED FOR BETTER GROWTH  Final   Report Status PENDING  Incomplete  Blood culture (routine x 2)     Status: None (Preliminary result)   Collection Time: 06/22/17 11:50 PM  Result Value Ref Range Status   Specimen Description  BLOOD RIGHT HAND  Final   Special Requests IN PEDIATRIC BOTTLE Blood Culture adequate volume  Final   Culture NO GROWTH 1 DAY  Final   Report Status PENDING  Incomplete  Blood culture (routine x 2)     Status: None (Preliminary result)   Collection Time: 06/22/17 11:54 PM  Result Value Ref Range Status   Specimen Description BLOOD RIGHT HAND  Final   Special Requests IN PEDIATRIC BOTTLE Blood Culture adequate volume  Final   Culture NO GROWTH 1 DAY  Final   Report Status PENDING  Incomplete      Anti-infectives (From admission, onward)   Start     Dose/Rate Route Frequency Ordered Stop   06/23/17 2200  cefTRIAXone (ROCEPHIN) 1 g in dextrose 5 % 50 mL IVPB     1 g 100 mL/hr over 30 Minutes Intravenous Every 24 hours 06/23/17 0139     06/22/17 2300  cefTRIAXone (ROCEPHIN) 1 g in dextrose 5 % 50 mL IVPB     1 g 100 mL/hr over 30 Minutes Intravenous  Once 06/22/17 2254 06/23/17 0032       Radiology Studies: US Renal  Result Date: 06/23/2017 CLINICAL DATA:  Acute onset of renal failure. EXAM: RENAL / URINARY TRACT ULTRASOUND COMPLETE COMPARISON:  PET/CT performed 12/10/2009 FINDINGS: Right Kidney: Length: 10.6 cm. Echogenicity within normal limits. A 2.2 cm cyst is noted at the upper pole of the right kidney. No hydronephrosis visualized. Left Kidney: Length: 12.0 cm. Echogenicity within normal limits. Minimal left renal cortical thinning is noted. No mass or hydronephrosis visualized. Bladder: Decompressed, with a Foley catheter in place. Stones are seen dependently within the gallbladder. IMPRESSION: 1. No evidence of hydronephrosis. 2. Minimal left renal cortical thinning may reflect chronic renal disease. 3. Right renal cyst noted. 4. Cholelithiasis noted. Gallbladder otherwise grossly unremarkable in appearance. Electronically Signed   By: Garald Balding M.D.   On: 06/23/2017 02:38   Dg Chest Port 1 View  Result Date: 06/23/2017 CLINICAL DATA:  Patient is too weak to stand up. EXAM:  PORTABLE CHEST 1 VIEW COMPARISON:  09/23/2015 FINDINGS: Shallow inspiration. Heart size and pulmonary vascularity are normal. Linear scarring in the left mid lung is unchanged since prior study. No airspace disease or consolidation. No blunting of costophrenic angles. No pneumothorax. Calcification of the aorta. IMPRESSION: No evidence of active pulmonary disease.  Aortic atherosclerosis. Electronically Signed   By: Oren Beckmann.D.  On: 06/23/2017 01:54        Scheduled Meds: . aspirin EC  81 mg Oral Once per day on Mon Wed Fri  . clopidogrel  75 mg Oral Daily  . enoxaparin (LOVENOX) injection  40 mg Subcutaneous Q24H  . insulin aspart  0-5 Units Subcutaneous QHS  . insulin aspart  0-9 Units Subcutaneous TID WC  . insulin glargine  12 Units Subcutaneous Daily  . metoprolol tartrate  25 mg Oral BID  . tamsulosin  0.8 mg Oral Daily   Continuous Infusions: . sodium chloride 75 mL/hr at 06/24/17 0940  . cefTRIAXone (ROCEPHIN)  IV Stopped (06/23/17 2323)     LOS: 1 day    Time spent: 25 min    Geradine Girt, DO [facility] Pager [phone]  If 7PM-7AM, please contact night-coverage [email or web address] Password [email or web address] 06/24/2017, 1:03 PM

## 2017-06-24 NOTE — Progress Notes (Signed)
While at bedside Central telemetry called to report patient have trigeminy PCVs.  On assessment patient is asymptomatic.[name]  He is in bed resting and watching television.  Will continue to monitor.

## 2017-06-25 ENCOUNTER — Inpatient Hospital Stay (HOSPITAL_COMMUNITY): Payer: Medicare Other

## 2017-06-25 DIAGNOSIS — T796XXS Traumatic ischemia of muscle, sequela: Secondary | ICD-10-CM

## 2017-06-25 LAB — GLUCOSE, CAPILLARY
Glucose-Capillary: 105 mg/dL — ABNORMAL HIGH (ref 65–99)
Glucose-Capillary: 144 mg/dL — ABNORMAL HIGH (ref 65–99)
Glucose-Capillary: 155 mg/dL — ABNORMAL HIGH (ref 65–99)
Glucose-Capillary: 125 mg/dL — ABNORMAL HIGH (ref 65–99)

## 2017-06-25 LAB — CK: CK TOTAL: 1538 U/L — AB (ref 49–397)

## 2017-06-25 NOTE — Progress Notes (Signed)
Inpatient Rehabilitation  Met with patient and daughter at bedside to discuss team's recommendation for IP Rehab.  Shared booklets and answered questions.  They are familiar with our rehab program as the patient has been here before.  Will initiate insurnace authorization and plan to follow up Monday, 06/28/17.  Call if questions.   Carmelia Roller., CCC/SLP Admission Coordinator  [place]  Cell [phone]

## 2017-06-25 NOTE — H&P (Signed)
Physical Medicine and Rehabilitation Admission H&P    Chief Complaint  Patient presents with  . Weakness  : HPI: [name] is a 77 y.o. 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 Per chart review patient lives alone. Independent with a rolling walker a straight point cane prior to admission. He still mows his own grass performs his own grocery shopping and still drives. One level home with 3 steps to entry. Daughter can assist as needed. Presented 06/22/2017 after being found by his daughter in the bathroom unable to get up from the toilet for approximately 10 hours with diffuse weakness. Noted stumbling gait but denied falls. He had complaints of some dysuria.[name] MRI the brain negative for acute changes. WBC 15,200, BUN 33, creatinine 1.88. CPK elevated 12,430, Urine study showed multiple leukocytes large hemoglobin no RBCs no bacteria. Started on empiric antibiotic coverage as well as aggressive IV fluids. Bladder scan 475 mL's. Chest x-ray was negative. Renal ultrasound showed no evidence of hydronephrosis. Urine study negative nitrite and urine culture recently incubated for better growth and remains on empiric Rocephin that has since been discontinued. Blood cultures remain negative. Urology consulted for evaluation of possible urethral stricture and difficult catheterization. A Foley catheter tube was placed await plan for voiding trial. Placed on subcutaneous Lovenox for DVT prophylaxis. CKs trending down nicely 1538-280. Physical and occupational therapy evaluations completed 06/24/2017 with recommendations of physical  medicine rehabilitation consult. Patient was admitted for a comprehensive rehabilitation program    Review of Systems  Constitutional: Negative for fever.  HENT: Positive for hearing loss.   Eyes: Negative for blurred vision.  Cardiovascular: Positive for palpitations and leg swelling.  Gastrointestinal: Positive for constipation. Negative for nausea.  Genitourinary: Positive for dysuria and urgency.  Musculoskeletal: Positive for joint pain and myalgias.  Skin: Negative for rash.  Neurological: Positive for weakness. Negative for seizures.  All other systems reviewed and are negative.  Past Medical History:  Diagnosis Date  . A-fib ([place])   . 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 Cataract   . Charcot-Marie-Tooth disease   . Diabetes mellitus   . Hypertension   . 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 SIRS (systemic inflammatory response syndrome) ([place]) 09/2015  . Stroke ([place])   . Weakness    Past Surgical History:  Procedure Laterality Date  . ANKLE FUSION Left   . CATARACT EXTRACTION    . CIRCUMCISION    . TOE AMPUTATION     right foot third toe   . URETHRA SURGERY     Family History  Problem Relation Age of Onset  . Hypertension Mother   . Hypertension Father    Social History:  reports that he quit smoking about 26 years ago. He has a 70.00 pack-year smoking history. He quit smokeless tobacco use about 27 years ago. He reports that he does not drink alcohol or use drugs. Allergies:  Allergies  Allergen Reactions  . Actos [Pioglitazone Hydrochloride] Other (See Comments)    Afib  . Hydrochlorothiazide Other (See Comments)    Lower potassium to low  . Penicillins Rash    Tolerated  cefepime, ceftriaxone   Medications Prior to Admission  Medication Sig Dispense Refill  . aspirin EC 81 MG EC tablet Take 1 tablet (81 mg total) by mouth daily. (Patient taking differently: Take 81 mg by mouth 3  (three) times a week. Monday, Wednesday and Friday) 30 tablet 0  . clopidogrel (PLAVIX) 75 MG tablet Take 75 mg by mouth daily.    . insulin glargine (LANTUS) 100 UNIT/ML injection Inject 12 Units into the skin daily.     [name] lisinopril (PRINIVIL,ZESTRIL) 10 MG tablet Take 10 mg by mouth daily.    [name] loperamide (IMODIUM A-D) 2 MG tablet Take 2 mg by mouth 4 (four) times daily as needed for diarrhea or loose stools.    . metFORMIN (GLUCOPHAGE) 500 MG tablet Take 500 mg by mouth daily with breakfast.     . metoprolol tartrate (LOPRESSOR) 25 MG tablet Take 0.5 tablets (12.5 mg total) by mouth 2 (two) times daily. (Patient taking differently: Take 25 mg by mouth 2 (two) times daily. ) 60 tablet 0  . repaglinide (PRANDIN) 0.5 MG tablet Take 0.25 mg by mouth 3 (three) times daily before meals.    . simvastatin (ZOCOR) 20 MG tablet Take 20 mg by mouth at bedtime.      . tamsulosin (FLOMAX) 0.4 MG CAPS capsule Take 2 capsules (0.8 mg total) by mouth daily. 30 capsule 0  . cephALEXin (KEFLEX) 500 MG capsule Take 1 capsule (500 mg total) by mouth 2 (two) times daily. (Patient not taking: Reported on 06/22/2017) 10 capsule 0  . saccharomyces boulardii (FLORASTOR) 250 MG capsule Take 1 capsule (250 mg total) by mouth 2 (two) times daily. (Patient not taking: Reported on 06/22/2017) 30 capsule 0    Drug Regimen Review Drug regimen was reviewed and remains appropriate with no significant issues identified  Home: Home Living Family/patient expects to be discharged to:: Private residence Living Arrangements: Alone Available Help at Discharge: Family, Available 24 hours/day(to provide supervision only) Type of Home: House Home Access: Stairs to enter, Ramped entrance Technical brewer of Steps: 3 Entrance Stairs-Rails: Right Home Layout: One level Home Equipment: Environmental consultant - 2 wheels, Cane - single point, Bedside commode   Functional History: Prior Function Level of Independence: Independent with assistive  device(s) Comments: PTA pt was indep with RW/SPC, managing bills, mowing grass, grocery shopping, driving, etc.   Functional Status:  Mobility: Bed Mobility Overal bed mobility: Needs Assistance Bed Mobility: Rolling, Sidelying to Sit Rolling: Min assist Sidelying to sit: Min assist, HOB elevated Supine to sit: Max assist, HOB elevated Sit to supine: Mod assist General bed mobility comments: Pt required min A to elevate trunk. Able to progress LEs off EOB. Use of bed rails and increased time required to scoot to EOB Transfers Overall transfer level: Needs assistance Equipment used: Rolling walker (2 wheeled) Transfers: Sit to/from Stand, Stand Pivot Transfers Sit to Stand: +2 physical assistance, Mod assist Stand pivot transfers: +2 physical assistance, Min assist General transfer comment: Pt able to rise into standing with +2 assist to power up. Min A +2 to steady and take small steps to recliner chair. Ambulation/Gait General Gait Details: Pt able to take a few unsteady steps in place at recliner.     ADL:    Cognition: Cognition Overall Cognitive Status: History of cognitive impairments - at baseline Orientation Level: Oriented X4 Cognition Arousal/Alertness: Awake/alert Behavior During Therapy: WFL for tasks assessed/performed Overall Cognitive Status: History of cognitive impairments - at baseline General Comments: Moments of strong emotion,  but quickly returns to normal  Physical Exam: Blood pressure (!) 149/54, pulse 62, temperature 98.3 F (36.8 C), temperature source Oral, resp. rate 20, height '5\' 9"'$  (1.753 m), weight 79.8 kg (176 lb), SpO2 97 %. Physical Exam  Vitals reviewed. HENT:  Head: Normocephalic.  Eyes: EOM are normal. Right eye exhibits no discharge. Left eye exhibits no discharge.  Neck: Normal range of motion. Neck supple. No tracheal deviation present. No thyromegaly present.  Cardiovascular: Regular rhythm. Exam reveals no gallop and no friction  rub.  No murmur heard. Cardiac rate controlled  Respiratory: Effort normal and breath sounds normal. No respiratory distress. He has no wheezes. He has no rales.  GI: Soft. Bowel sounds are normal. He exhibits no distension.  Skin. Warm and dry Neurological: He is alert and oriented to person, place, and time.   Normal insight and awareness.  Left upper strength grossly 4+ out of 5 proximal to distal.  Right upper extremity 4 to 4+ out of 5 proximal to distal.  Right lower extremity is 2- and 2 out of 5 hip flexion 4 out of 5 knee extension and ankle dorsiflexion/plantarflexion.  Left lower extremity is 3 out of 5 hip flexion, 3+ out of 5 knee extension  and 4+ out of 5 ankle dorsiflexion and plantar flexion.no gross sensory abnormalities    psychiatric: Patient pleasant and appropriate.   Results for orders placed or performed during the hospital encounter of 06/22/17 (from the past 48 hour(s))  CK     Status: Abnormal   Collection Time: 06/23/17  1:14 PM  Result Value Ref Range   Total CK 7,663 (H) 49 - 397 U/L    Comment: RESULTS CONFIRMED BY MANUAL DILUTION  Troponin I     Status: Abnormal   Collection Time: 06/23/17  1:14 PM  Result Value Ref Range   Troponin I 0.10 (HH) <0.03 ng/mL    Comment: CRITICAL VALUE NOTED.  VALUE IS CONSISTENT WITH PREVIOUSLY REPORTED AND CALLED VALUE.  Glucose, capillary     Status: Abnormal   Collection Time: 06/23/17  5:35 PM  Result Value Ref Range   Glucose-Capillary 110 (H) 65 - 99 mg/dL  Glucose, capillary     Status: Abnormal   Collection Time: 06/23/17  9:25 PM  Result Value Ref Range   Glucose-Capillary 161 (H) 65 - 99 mg/dL  CK     Status: Abnormal   Collection Time: 06/24/17  5:05 AM  Result Value Ref Range   Total CK 3,446 (H) 49 - 397 U/L  CBC     Status: Abnormal   Collection Time: 06/24/17  5:05 AM  Result Value Ref Range   WBC 14.1 (H) 4.0 - 10.5 K/uL   RBC 4.70 4.22 - 5.81 MIL/uL   Hemoglobin 14.3 13.0 - 17.0 g/dL   HCT 42.9  39.0 - 52.0 %   MCV 91.3 78.0 - 100.0 fL   MCH 30.4 26.0 - 34.0 pg   MCHC 33.3 30.0 - 36.0 g/dL   RDW 14.0 11.5 - 15.5 %   Platelets 107 (L) 150 - 400 K/uL    Comment: PLATELET COUNT CONFIRMED BY SMEAR  Comprehensive metabolic panel     Status: Abnormal   Collection Time: 06/24/17  5:05 AM  Result Value Ref Range   Sodium 140 135 - 145 mmol/L   Potassium 4.1 3.5 - 5.1 mmol/L   Chloride 112 (H) 101 - 111 mmol/L   CO2 23 22 - 32 mmol/L   Glucose, Bld 147 (  H) 65 - 99 mg/dL   BUN 24 (H) 6 - 20 mg/dL   Creatinine, Ser 0.94 0.61 - 1.24 mg/dL   Calcium 7.7 (L) 8.9 - 10.3 mg/dL   Total Protein 4.8 (L) 6.5 - 8.1 g/dL   Albumin 2.3 (L) 3.5 - 5.0 g/dL   AST 113 (H) 15 - 41 U/L   ALT 54 17 - 63 U/L   Alkaline Phosphatase 45 38 - 126 U/L   Total Bilirubin 0.7 0.3 - 1.2 mg/dL   GFR calc non Af Amer >60 >60 mL/min   GFR calc Af Amer >60 >60 mL/min    Comment: (NOTE) The eGFR has been calculated using the CKD EPI equation. This calculation has not been validated in all clinical situations. eGFR's persistently <60 mL/min signify possible Chronic Kidney Disease.    Anion gap 5 5 - 15  Glucose, capillary     Status: Abnormal   Collection Time: 06/24/17  7:51 AM  Result Value Ref Range   Glucose-Capillary 113 (H) 65 - 99 mg/dL  Glucose, capillary     Status: Abnormal   Collection Time: 06/24/17 11:53 AM  Result Value Ref Range   Glucose-Capillary 137 (H) 65 - 99 mg/dL  Glucose, capillary     Status: Abnormal   Collection Time: 06/24/17  4:44 PM  Result Value Ref Range   Glucose-Capillary 143 (H) 65 - 99 mg/dL  Glucose, capillary     Status: Abnormal   Collection Time: 06/24/17  9:39 PM  Result Value Ref Range   Glucose-Capillary 123 (H) 65 - 99 mg/dL  CK     Status: Abnormal   Collection Time: 06/25/17  3:25 AM  Result Value Ref Range   Total CK 1,538 (H) 49 - 397 U/L  Glucose, capillary     Status: Abnormal   Collection Time: 06/25/17  8:00 AM  Result Value Ref Range    Glucose-Capillary 105 (H) 65 - 99 mg/dL   No results found.     Medical Problem List and Plan: 1.  Debility secondary to rhabdomyolysis related to fall/urosepsis as well as history of CVA with right-sided residual weakness.    -Admit to inpatient rehab    -total CKs trending down 2.  DVT Prophylaxis/Anticoagulation: Subcutaneous Lovenox. Monitor platelet counts in any signs of bleeding 3. Pain Management: Tylenol as needed 4. Mood: Provide emotional support 5. Neuropsych: This patient is capable of making decisions on his own behalf. 6. Skin/Wound Care: Routine skin checks 7. Fluids/Electrolytes/Nutrition: Routine I&O's with follow-up chemistries 8. Hypertension. Norvasc 5 mg daily, Lopressor 25 mg twice a day. Monitor with increased mobility 9. History of urethral stricture multiple dilations. Follow-up urology services.  Rhabdomyolysis continue Flomax 0.8 mg daily.  I reviewed the fact with the patient that I would like to remove Foley in the morning and begin voiding trial 10. PAF. Cardiac rate controlled. Continue aspirin and Plavix 11. Diabetes mellitus with peripheral neuropathy. Hemoglobin A1c 7.0. Lantus insulin 12 units daily. Check blood sugars before meals and at bedtime. Diabetic teaching   Post Admission Physician Evaluation: 1. Functional deficits secondary  to in the setting of chronic right-sided weakness after CVA. 2. Patient is admitted to receive collaborative, interdisciplinary care between the physiatrist, rehab nursing staff, and therapy team. 3. Patient's level of medical complexity and substantial therapy needs in context of that medical necessity cannot be provided at a lesser intensity of care such as a SNF. 4. Patient has experienced substantial functional loss from his/her baseline which was  documented above under the "Functional History" and "Functional Status" headings.  Judging by the patient's diagnosis, physical exam, and functional history, the patient has  potential for functional progress which will result in measurable gains while on inpatient rehab.  These gains will be of substantial and practical use upon discharge  in facilitating mobility and self-care at the household level. 5. Physiatrist will provide 24 hour management of medical needs as well as oversight of the therapy plan/treatment and provide guidance as appropriate regarding the interaction of the two. 6. The Preadmission Screening has been reviewed and patient status is unchanged unless otherwise stated above. 7. 24 hour rehab nursing will assist with bladder management, bowel management, safety, skin/wound care, disease management, medication administration, pain management and patient education  and help integrate therapy concepts, techniques,education, etc. 8. PT will assess and treat for/with: Lower extremity strength, range of motion, stamina, balance, functional mobility, safety, adaptive techniques and equipment, pain management, neuromuscular education.   Goals are: Modified independent to supervision. 9. OT will assess and treat for/with: ADL's, functional mobility, safety, upper extremity strength, adaptive techniques and equipment, neuromuscular reeducation, pain control, ego support.   Goals are: Modified independent to supervision. Therapy may proceed with showering this patient. 10. SLP will assess and treat for/with: n/a.  Goals are: n/a. 11. Case Management and Social Worker will assess and treat for psychological issues and discharge planning. 12. Team conference will be held weekly to assess progress toward goals and to determine barriers to discharge. 13. Patient will receive at least 3 hours of therapy per day at least 5 days per week. 14. ELOS: 14-18vdays       15. Prognosis:  excellent     Meredith Staggers, MD, [facility] 06/28/2017  Lavon Paganini [place], PA-C 06/25/2017

## 2017-06-25 NOTE — Evaluation (Signed)
Occupational Therapy Evaluation Patient Details Name: [name] MRN: [id number] DOB: 06/16/1941 Today's Date: 06/25/2017    History of Present Illness Pt is a 77 y/o male admitted with profound weakness of sudden onset, workup pending (UTI (-)).  PMH HTN, Afib, NHL s/p chemo/radiation, DM, CVA with R hemiparesis   Clinical Impression   Pt admitted with the above diagnoses and presents with below problem list. Pt will benefit from continued acute OT to address the below listed deficits and maximize independence with basic ADLs prior to d/c to next venue. PTA pt was mod I with ADLs, drives during the day, and is out in the community a few times a week. Pt presents with UB/LB generalized weakness impacting balance and activity tolerance with ADLs. Pt currently min-mod +2 physical assist with ADLs. Pt reports he feels a little stronger today compared to yesterday. Pivotol steps EOB to recliner completed today. Given significant acute change in ADL assist level compared to baseline, recommending CIR at d/c for further therapy prior to returning home.     Follow Up Recommendations  CIR    Equipment Recommendations  Other (comment)(defer to next venue)    Recommendations for Other Services       Precautions / Restrictions Precautions Precautions: Fall Restrictions Weight Bearing Restrictions: No      Mobility Bed Mobility Overal bed mobility: Needs Assistance Bed Mobility: Rolling;Sidelying to Sit Rolling: Min assist Sidelying to sit: Min assist;HOB elevated       General bed mobility comments: Pt required min A to elevate trunk. Able to progress LEs off EOB. Use of bed rails and increased time required to scoot to EOB  Transfers Overall transfer level: Needs assistance Equipment used: Rolling walker (2 wheeled) Transfers: Sit to/from [facility] Sit to Stand: +2 physical assistance;Mod assist Stand pivot transfers: +2 physical assistance;Min assist        General transfer comment: Pt able to rise into standing with +2 assist to power up. Min A +2 to steady and take small steps to recliner chair.    Balance Overall balance assessment: Needs assistance Sitting-balance support: Bilateral upper extremity supported;Feet supported Sitting balance-Leahy Scale: Fair Sitting balance - Comments: able to sit EOB for ~3 min with min guard for safety, no assist required for support.   Standing balance support: Bilateral upper extremity supported Standing balance-Leahy Scale: Poor Standing balance comment: Reliant on UE support. Able to stand fully with min guard for ~2 min. Cueing for postural control.                           ADL either performed or assessed with clinical judgement   ADL Overall ADL's : Needs assistance/impaired Eating/Feeding: Set up;Sitting   Grooming: Set up;Sitting Grooming Details (indicate cue type and reason): 1 task in unsupported sitting.  Upper Body Bathing: Moderate assistance;Sitting   Lower Body Bathing: Moderate assistance;+2 for physical assistance;Sit to/from stand   Upper Body Dressing : Moderate assistance;Sitting   Lower Body Dressing: Moderate assistance;+2 for physical assistance;Sit to/from stand   Toilet Transfer: Moderate assistance;+2 for physical assistance;Stand-pivot   Toileting- Clothing Manipulation and Hygiene: Moderate assistance;+2 for physical assistance;Sit to/from stand   Tub/ Banker: Moderate assistance;+2 for physical assistance;Stand-pivot;3 in 1;Rolling walker     General ADL Comments: Pt completed bed mobility, pivotal steps to recliner, stood about 2 minutes with walking in place a little bit incorporated. Pt then fatigued quickly and initiated sitting into recliner.  Vision         Perception     Praxis      Pertinent Vitals/Pain Pain Assessment: Faces Faces Pain Scale: Hurts little more Pain Location: BIL UE and R Foot Pain Descriptors /  Indicators: Aching;Cramping;Discomfort Pain Intervention(s): Monitored during session;Repositioned     Hand Dominance Right   Extremity/Trunk Assessment Upper Extremity Assessment Upper Extremity Assessment: Generalized weakness;Overall [facility] for tasks assessed   Lower Extremity Assessment Lower Extremity Assessment: Defer to PT evaluation       Communication Communication Communication: No difficulties   Cognition Arousal/Alertness: Awake/alert Behavior During Therapy: WFL for tasks assessed/performed Overall Cognitive Status: History of cognitive impairments - at baseline                                 General Comments: Moments of strong emotion, but quickly returns to normal.    General Comments  Pt's daughter and son-in-law present for last part of session.     Exercises General Exercises - Lower Extremity Ankle Circles/Pumps: AROM;Both;10 reps;Seated Long Arc Quad: AROM;Both;10 reps;Seated Heel Slides: AROM;Both;10 reps;Seated Hip ABduction/ADduction: AROM;Both;10 reps;Seated   Shoulder Instructions      Home Living Family/patient expects to be discharged to:: Private residence Living Arrangements: Alone Available Help at Discharge: Family;Available 24 hours/day Type of Home: House Home Access: Stairs to enter;Ramped entrance Entrance Stairs-Number of Steps: 3 Entrance Stairs-Rails: Right Home Layout: One level         Bathroom Toilet: Standard     Home Equipment: Walker - 2 wheels;Cane - single point;Bedside commode          Prior Functioning/Environment Level of Independence: Independent with assistive device(s)        Comments: PTA pt was indep with RW/SPC, managing bills, mowing grass, grocery shopping, driving, etc.         OT Problem List: Decreased strength;Decreased activity tolerance;Impaired balance (sitting and/or standing);Decreased knowledge of use of DME or AE;Decreased knowledge of precautions;Impaired  sensation;Pain      OT Treatment/Interventions: Self-care/ADL training;Therapeutic exercise;Energy conservation;DME and/or AE instruction;Therapeutic activities;Patient/family education;Balance training    OT Goals(Current goals can be found in the care plan section) Acute Rehab OT Goals Patient Stated Goal: To get stronger so he can go home OT Goal Formulation: With patient/family Time For Goal Achievement: 07/09/17 Potential to Achieve Goals: Good ADL Goals Pt Will Perform Grooming: with min guard assist;standing Pt Will Perform Upper Body Bathing: with set-up;sitting Pt Will Perform Lower Body Bathing: with min guard assist;sit to/from stand Pt Will Perform Upper Body Dressing: with set-up;sitting Pt Will Perform Lower Body Dressing: with min guard assist;sit to/from stand Pt Will Transfer to Toilet: with min assist;ambulating Pt Will Perform Toileting - Clothing Manipulation and hygiene: with min guard assist;sit to/from stand Pt Will Perform Tub/Shower Transfer: with min assist;ambulating;3 in 1;rolling walker  OT Frequency: Min 3X/week   Barriers to D/C:            Co-evaluation PT/OT/SLP Co-Evaluation/Treatment: Yes Reason for Co-Treatment: To address functional/ADL transfers;For patient/therapist safety PT goals addressed during session: Mobility/safety with mobility;Strengthening/ROM OT goals addressed during session: ADL's and self-care      AM-PAC PT "6 Clicks" Daily Activity     Outcome Measure Help from another person eating meals?: A Little Help from another person taking care of personal grooming?: A Little Help from another person toileting, which includes using toliet, bedpan, or urinal?: A Lot Help from another person bathing (  including washing, rinsing, drying)?: A Lot Help from another person to put on and taking off regular upper body clothing?: A Little Help from another person to put on and taking off regular lower body clothing?: A Lot 6 Click Score:  15   End of Session Equipment Utilized During Treatment: Gait belt;Rolling walker Nurse Communication: Mobility status  Activity Tolerance: Patient tolerated treatment well;Patient limited by fatigue Patient left: in chair;with call bell/phone within reach;with chair alarm set;with family/visitor present  OT Visit Diagnosis: Unsteadiness on feet (R26.81);Muscle weakness (generalized) (M62.81);History of falling (Z91.81)                Time: 6546-5035 OT Time Calculation (min): 23 min Charges:  OT General Charges $OT Visit: 1 Visit OT Evaluation $OT Eval Low Complexity: 1 Low G-Codes:       Hortencia Pilar 06/25/2017, 12:49 PM

## 2017-06-25 NOTE — Progress Notes (Signed)
PROGRESS NOTE    [name]  QMV:[id number] DOB: August 20, 1940 DOA: 06/22/2017 PCP: Alroy Dust, L.Marlou Sa, MD   Outpatient Specialists:     Brief Narrative:  [name] is a 77 y.o. male with medical history significant of  HTN, afib, NHL s/p chemo/rad, DM type II, history of CVA with right hemiparesis, urological issues including urethral stricture s/p dilations; who presents with complaints of weakness unable to get off the bathroom toilet of his home since approximately 500.   He was found by his daughter around 1500.  Prior to onset of symptoms patient had been in his normal state of health and had watched the [employer] celebrations on his television at home.  At baseline patient is able to complete all of his ADLs and lives alone.   High risk for CVA but appears this episode of weakness may be due to UTI.  Suspect d/c in 1-2 days. mRI pending   Assessment & Plan:   Generalized weakness -Likely due to dehydration and UTI however patient reports worsening right leg weakness from his baseline and hence will check MRI brain to rule out new/recent CVA -Continue antibiotics for UTI will transition to oral antibiotics today -Continue IV fluids for another 24 hoursand, follow CK  Acute renal failure, ureteral stricture, urinary retention: Acute.   -Patient with previous history of ureteral stricture requiring dilations.   -Patient's baseline creatinine previously within normal limits -creatinine improving with hydration, ultrasound without evidence of hydronephrosis only notes mild cortical thinning -Continue Foley catheter, urology consult appreciated -And when he is more ambulatory would be appropriate to DC Foley and start voiding trial then, not ready at this time -also has BPH, continue Flomax -Follow-up with urology Dr.Wrenn  Possible urinary tract infection -Continue ceftriaxone, follow-up urine culture, still pending at this time -Blood cultures negative - Monitor kidney  function - renal U/S w/o blockage but shows some cortical thinning  Rhabdomyolysis: Acute -improving with hydration -CK was significantly elevated at 12,000 on admission, improving -Is unclear if being on the toilet for 10 hours could be the sole cause of this, no history of seizures -Hold statins  Hypertensive urgency -improving, continue metoprolol - Hydralazine IV prn  History of CVA with right hemiparesis -Reports worsening of right leg weakness in the recent week -Check MRI brain, in addition patient had also been using Plavix orally twice a week due to nosebleeds, instead of daily - Continue aspirin and Plavix  Diabetes mellitus type 2 :  - hemoglobin A1c Is 7 - Hold metformin and repaglinide  - Continue Lantus  - CBGs q. before meals and at bedtime with sensitive SSI  Mild elevation of troponin -clinically no evidence of ACS, EKG nonacute and no symptoms -likely due to demand from tolerated hypertension, trending down  Paroxysmal atrial fibrillation:  -Patient currently in sinus rhythm. Chadsvasc score = 6.  per chart review, patient apparently refused being placed on Eliquis due to he reports his history of easy bleeding. - Continue to monitor - he is on dual antiplatelet therapy since his stroke  Thrombocytopenia: Chronic. Platelet count 122 on admission - stable, monitor  Hyperlipidemia - Held simvastatin pending CPK  BPH - Continue Flomax  History of non-Hodgkin's lymphoma:  -Patient reports being status post remote radiation and chemotherapy.   DVT prophylaxis: Lovenox  Code Status:Full Code Family Communication:At bedside 1/2 Disposition Plan:  likley CIR vs SNF pending workup    Consultants:   Urology   CIR   Subjective: -still feels weak  but R leg improving  Objective: Vitals:   06/24/17 0912 06/24/17 1430 06/24/17 2145 06/25/17 0516  BP: (!) 144/55 (!) 155/56 (!) 158/57 (!) 149/54  Pulse: 80 67 64 62  Resp:  16 20 20   Temp:   98.1 F (36.7 C) 98.1 F (36.7 C) 98.3 F (36.8 C)  TempSrc:  Oral Oral Oral  SpO2:  96% 98% 97%  Weight:      Height:        Intake/Output Summary (Last 24 hours) at 06/25/2017 1353 Last data filed at 06/25/2017 1239 Gross per 24 hour  Intake 2905 ml  Output 1400 ml  Net 1505 ml   Filed Weights   06/22/17 1649  Weight: 79.8 kg (176 lb)    Examination:  Gen: Awake, Alert, Oriented X 3,  HEENT: PERRLA, Neck supple, no JVD Lungs: Good air movement bilaterally, CTAB CVS: RRR,No Gallops,Rubs or new Murmurs Abd: soft, Non tender, non distended, BS present Extremities: No Cyanosis, Clubbing or edema Skin: no new rashes Neuro: RLE weakness 3/5, rest 4+/5 Foley in situ     Data Reviewed: I have personally reviewed following labs and imaging studies  CBC: Recent Labs  Lab 06/22/17 1740 06/23/17 0325 06/24/17 0505  WBC 15.2* 13.3* 14.1*  NEUTROABS 12.9*  --   --   HGB 17.5* 14.4 14.3  HCT 50.0 41.2 42.9  MCV 89.8 89.4 91.3  PLT 122* 127* 017*   Basic Metabolic Panel: Recent Labs  Lab 06/22/17 1740 06/23/17 0325 06/24/17 0505  NA 136 136 140  K 4.5 4.4 4.1  CL 96* 108 112*  CO2 23 19* 23  GLUCOSE 208* 199* 147*  BUN 33* 39* 24*  CREATININE 1.88* 1.43* 0.94  CALCIUM 8.8* 7.5* 7.7*   GFR: Estimated Creatinine Clearance: 66.9 mL/min (by C-G formula based on SCr of 0.94 mg/dL). Liver Function Tests: Recent Labs  Lab 06/23/17 0325 06/24/17 0505  AST 205* 113*  ALT 58 54  ALKPHOS 50 45  BILITOT 0.7 0.7  PROT 5.2* 4.8*  ALBUMIN 2.7* 2.3*   No results for input(s): LIPASE, AMYLASE in the last 168 hours. No results for input(s): AMMONIA in the last 168 hours. Coagulation Profile: No results for input(s): INR, PROTIME in the last 168 hours. Cardiac Enzymes: Recent Labs  Lab 06/22/17 1740 06/23/17 0330 06/23/17 0656 06/23/17 1314 06/24/17 0505 06/25/17 0325  CKTOTAL 12,430*  --   --  7,663* 3,446* 1,538*  TROPONINI  --  0.15* 0.13* 0.10*  --   --      BNP (last 3 results) No results for input(s): PROBNP in the last 8760 hours. HbA1C: Recent Labs    06/23/17 0331  HGBA1C 7.0*   CBG: Recent Labs  Lab 06/24/17 1153 06/24/17 1644 06/24/17 2139 06/25/17 0800 06/25/17 1215  GLUCAP 137* 143* 123* 105* 144*   Lipid Profile: No results for input(s): CHOL, HDL, LDLCALC, TRIG, CHOLHDL, LDLDIRECT in the last 72 hours. Thyroid Function Tests: No results for input(s): TSH, T4TOTAL, FREET4, T3FREE, THYROIDAB in the last 72 hours. Anemia Panel: No results for input(s): VITAMINB12, FOLATE, FERRITIN, TIBC, IRON, RETICCTPCT in the last 72 hours. Urine analysis:    Component Value Date/Time   COLORURINE YELLOW 06/22/2017 2207   APPEARANCEUR HAZY (A) 06/22/2017 2207   LABSPEC 1.011 06/22/2017 2207   PHURINE 5.0 06/22/2017 2207   GLUCOSEU 50 (A) 06/22/2017 2207   HGBUR LARGE (A) 06/22/2017 2207   BILIRUBINUR NEGATIVE 06/22/2017 2207   KETONESUR 5 (A) 06/22/2017 2207   PROTEINUR  100 (A) 06/22/2017 2207   UROBILINOGEN 0.2 05/29/2014 2125   NITRITE NEGATIVE 06/22/2017 2207   LEUKOCYTESUR MODERATE (A) 06/22/2017 2207     ) Recent Results (from the past 240 hour(s))  Urine culture     Status: None (Preliminary result)   Collection Time: 06/22/17 10:54 PM  Result Value Ref Range Status   Specimen Description URINE, CATHETERIZED  Final   Special Requests NONE  Final   Culture CULTURE REINCUBATED FOR BETTER GROWTH  Final   Report Status PENDING  Incomplete  Blood culture (routine x 2)     Status: None (Preliminary result)   Collection Time: 06/22/17 11:50 PM  Result Value Ref Range Status   Specimen Description BLOOD RIGHT HAND  Final   Special Requests IN PEDIATRIC BOTTLE Blood Culture adequate volume  Final   Culture NO GROWTH 1 DAY  Final   Report Status PENDING  Incomplete  Blood culture (routine x 2)     Status: None (Preliminary result)   Collection Time: 06/22/17 11:54 PM  Result Value Ref Range Status   Specimen  Description BLOOD RIGHT HAND  Final   Special Requests IN PEDIATRIC BOTTLE Blood Culture adequate volume  Final   Culture NO GROWTH 1 DAY  Final   Report Status PENDING  Incomplete      Anti-infectives (From admission, onward)   Start     Dose/Rate Route Frequency Ordered Stop   06/23/17 2200  cefTRIAXone (ROCEPHIN) 1 g in dextrose 5 % 50 mL IVPB     1 g 100 mL/hr over 30 Minutes Intravenous Every 24 hours 06/23/17 0139     06/22/17 2300  cefTRIAXone (ROCEPHIN) 1 g in dextrose 5 % 50 mL IVPB     1 g 100 mL/hr over 30 Minutes Intravenous  Once 06/22/17 2254 06/23/17 0032       Radiology Studies: No results found.      Scheduled Meds: . aspirin EC  81 mg Oral Once per day on Mon Wed Fri  . clopidogrel  75 mg Oral Daily  . enoxaparin (LOVENOX) injection  40 mg Subcutaneous Q24H  . insulin aspart  0-5 Units Subcutaneous QHS  . insulin aspart  0-9 Units Subcutaneous TID WC  . insulin glargine  12 Units Subcutaneous Daily  . metoprolol tartrate  25 mg Oral BID  . tamsulosin  0.8 mg Oral Daily   Continuous Infusions: . sodium chloride 1,000 mL (06/25/17 1239)  . cefTRIAXone (ROCEPHIN)  IV Stopped (06/24/17 2258)     LOS: 2 days    Time spent: 25 min    Domenic Polite, MD [facility] Page via [email or web address], password [email or web address]  If 7PM-7AM, please contact night-coverage  06/25/2017, 1:53 PM

## 2017-06-25 NOTE — Progress Notes (Signed)
Physical Therapy Treatment Patient Details Name: [name] MRN: [id number] DOB: 1940-08-13 Today's Date: 06/25/2017    History of Present Illness Pt is a 77 y/o male admitted with profound weakness of sudden onset, workup pending (UTI (-)).  PMH HTN, Afib, NHL s/p chemo/radiation, DM, CVA with R hemiparesis    PT Comments    Pt is showing progress towards all goals this session and requiring less assist for mobility than previous sessions. Pt able to stand with +2 mod A and transfer to recliner with min A +2. Pregait activities performed while standing at recliner before pt fatigued and required sitting rest break. Continue to progress pre gait and gait activities in next session. Will continue to follow actually.     Follow Up Recommendations  CIR;Supervision/Assistance - 24 hour     Equipment Recommendations  Other (comment)(tbd in next venue)    Recommendations for Other Services Rehab consult;OT consult     Precautions / Restrictions Precautions Precautions: Fall Restrictions Weight Bearing Restrictions: No    Mobility  Bed Mobility Overal bed mobility: Needs Assistance Bed Mobility: Rolling;Sidelying to Sit Rolling: Min assist Sidelying to sit: Min assist;HOB elevated       General bed mobility comments: Pt required min A to elevate trunk. Able to progress LEs off EOB. Use of bed rails and increased time required to scoot to EOB  Transfers Overall transfer level: Needs assistance Equipment used: Rolling walker (2 wheeled) Transfers: Sit to/from [facility] Sit to Stand: +2 physical assistance;Mod assist Stand pivot transfers: +2 physical assistance;Min assist       General transfer comment: Pt able to rise into standing with +2 assist to power up. Min A +2 to steady and take small steps to recliner chair.  Ambulation/Gait             General Gait Details: Pt able to take a few unsteady steps in place at recliner.    Stairs             Wheelchair Mobility    Modified Rankin (Stroke Patients Only)       Balance Overall balance assessment: Needs assistance Sitting-balance support: Bilateral upper extremity supported;Feet supported Sitting balance-Leahy Scale: Fair Sitting balance - Comments: able to sit EOB for ~3 min with min guard for safety, no assist required for support.   Standing balance support: Bilateral upper extremity supported Standing balance-Leahy Scale: Poor Standing balance comment: Reliant on UE support. Able to stand fully with min guard for ~2 min. Cueing for postural control.                            Cognition Arousal/Alertness: Awake/alert Behavior During Therapy: WFL for tasks assessed/performed Overall Cognitive Status: History of cognitive impairments - at baseline                                 General Comments: Moments of strong emotion, but quickly returns to normal      Exercises General Exercises - Lower Extremity Ankle Circles/Pumps: AROM;Both;10 reps;Seated Long Arc Quad: AROM;Both;10 reps;Seated Heel Slides: AROM;Both;10 reps;Seated Hip ABduction/ADduction: AROM;Both;10 reps;Seated    General Comments        Pertinent Vitals/Pain Pain Assessment: Faces Faces Pain Scale: Hurts little more Pain Location: BIL UE and R Foot Pain Descriptors / Indicators: Aching;Cramping;Discomfort Pain Intervention(s): Monitored during session;Repositioned    Home Living  Prior Function            PT Goals (current goals can now be found in the care plan section) Acute Rehab PT Goals Patient Stated Goal: To get stronger so he can go home PT Goal Formulation: With patient Time For Goal Achievement: 07/08/17 Potential to Achieve Goals: Good Progress towards PT goals: Progressing toward goals    Frequency    Min 3X/week      PT Plan Current plan remains appropriate    Co-evaluation PT/OT/SLP  Co-Evaluation/Treatment: Yes Reason for Co-Treatment: For patient/therapist safety;To address functional/ADL transfers PT goals addressed during session: Mobility/safety with mobility;Strengthening/ROM OT goals addressed during session: ADL's and self-care      AM-PAC PT "6 Clicks" Daily Activity  Outcome Measure  Difficulty turning over in bed (including adjusting bedclothes, sheets and blankets)?: Unable Difficulty moving from lying on back to sitting on the side of the bed? : Unable Difficulty sitting down on and standing up from a chair with arms (e.g., wheelchair, bedside commode, etc,.)?: Unable Help needed moving to and from a bed to chair (including a wheelchair)?: A Lot Help needed walking in hospital room?: A Lot Help needed climbing 3-5 steps with a railing? : Total 6 Click Score: 8    End of Session Equipment Utilized During Treatment: Gait belt Activity Tolerance: Patient tolerated treatment well Patient left: with call bell/phone within reach;with family/visitor present;in chair;with chair alarm set Nurse Communication: Mobility status PT Visit Diagnosis: Muscle weakness (generalized) (M62.81);History of falling (Z91.81);Difficulty in walking, not elsewhere classified (R26.2)     Time: 8588-5027 PT Time Calculation (min) (ACUTE ONLY): 23 min  Charges:  $Therapeutic Activity: 8-22 mins                    G Codes:       Benjiman Core, [place] Pager [phone] Acute Rehab   Allena Katz 06/25/2017, 12:20 PM

## 2017-06-25 NOTE — Consult Note (Signed)
Physical Medicine and Rehabilitation Consult Reason for Consult: Decreased functional mobility Referring Physician: [facility]   HPI: [name] is a 77 y.o. 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 Per chart review patient lives alone. Independent with a rolling walker a straight point cane prior to admission. He still mows his own grass performs his own grocery shopping and still drives. One level home with 3 steps to entry. Daughter can assist as needed. Presented 06/22/2016 after being found by his daughter in the bathroom unable to get up from the toilet for approximately 10 hours with diffuse weakness. Noted stumbling gait but denied falls. He had complaints of some dysuria.. WBC 15,200, BUN 33, creatinine 1.88. CPK elevated 12,430, Urine study showed multiple leukocytes large hemoglobin no RBCs no bacteria. Started on empiric antibiotic coverage as well as aggressive IV fluids. Bladder scan 475 mL's. Chest x-ray was negative. Renal ultrasound showed no evidence of hydronephrosis. Urology consulted for evaluation of possible urethral stricture and difficult catheterization. A Foley catheter tube was placed await plan for voiding trial. Placed on subcutaneous Lovenox for DVT prophylaxis. CKs trending down nicely 1538. Physical therapy evaluation completed 06/24/2016 with recommendations of physical medicine rehabilitation consult.   Review of Systems  Constitutional: Negative for chills and fever.  HENT: Positive for hearing loss.   Eyes: Negative for blurred vision and double vision.  Respiratory: Negative for cough and shortness of  breath.   Cardiovascular: Positive for palpitations and leg swelling.  Gastrointestinal: Positive for constipation. Negative for nausea.  Genitourinary: Positive for dysuria and urgency.  Musculoskeletal: Positive for joint pain and myalgias.  Skin: Negative for rash.  Neurological: Positive for weakness. Negative for seizures.  All other systems reviewed and are negative.  Past Medical History:  Diagnosis Date  . A-fib ([place])   . 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 Cataract   . Charcot-Marie-Tooth disease   . Diabetes mellitus   . Hypertension   . 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 SIRS (systemic inflammatory response syndrome) ([place]) 09/2015  . Stroke ([place])   . Weakness    Past Surgical History:  Procedure Laterality Date  . ANKLE FUSION Left   . CATARACT EXTRACTION    . CIRCUMCISION    . TOE AMPUTATION     right foot third toe   . URETHRA SURGERY     Family History  Problem Relation Age of Onset  . Hypertension Mother   . Hypertension Father    Social History:  reports that he quit smoking about 26 years ago. He has a 70.00 pack-year smoking history. He quit smokeless tobacco use about 27 years ago. He reports that he does not drink alcohol or use drugs. Allergies:  Allergies  Allergen Reactions  . Actos [Pioglitazone Hydrochloride] Other (See Comments)    Afib  . Hydrochlorothiazide Other (See Comments)    Lower potassium to low  . Penicillins Rash    Tolerated cefepime, ceftriaxone   Medications Prior to Admission  Medication Sig Dispense Refill  . aspirin EC 81 MG EC tablet Take 1 tablet (81 mg total) by mouth daily. (Patient taking differently: Take  81 mg by mouth 3 (three) times a week. Monday, Wednesday and Friday) 30 tablet 0  . clopidogrel (PLAVIX) 75 MG tablet Take 75 mg by mouth daily.    . insulin glargine (LANTUS) 100 UNIT/ML injection Inject 12 Units into the skin daily.     [name]  lisinopril (PRINIVIL,ZESTRIL) 10 MG tablet Take 10 mg by mouth daily.    [name] loperamide (IMODIUM A-D) 2 MG tablet Take 2 mg by mouth 4 (four) times daily as needed for diarrhea or loose stools.    . metFORMIN (GLUCOPHAGE) 500 MG tablet Take 500 mg by mouth daily with breakfast.     . metoprolol tartrate (LOPRESSOR) 25 MG tablet Take 0.5 tablets (12.5 mg total) by mouth 2 (two) times daily. (Patient taking differently: Take 25 mg by mouth 2 (two) times daily. ) 60 tablet 0  . repaglinide (PRANDIN) 0.5 MG tablet Take 0.25 mg by mouth 3 (three) times daily before meals.    . simvastatin (ZOCOR) 20 MG tablet Take 20 mg by mouth at bedtime.      . tamsulosin (FLOMAX) 0.4 MG CAPS capsule Take 2 capsules (0.8 mg total) by mouth daily. 30 capsule 0  . cephALEXin (KEFLEX) 500 MG capsule Take 1 capsule (500 mg total) by mouth 2 (two) times daily. (Patient not taking: Reported on 06/22/2017) 10 capsule 0  . saccharomyces boulardii (FLORASTOR) 250 MG capsule Take 1 capsule (250 mg total) by mouth 2 (two) times daily. (Patient not taking: Reported on 06/22/2017) 30 capsule 0    Home: Home Living Family/patient expects to be discharged to:: Private residence Living Arrangements: Alone Available Help at Discharge: Family, Available 24 hours/day(to provide supervision only) Type of Home: House Home Access: Stairs to enter, Ramped entrance Technical brewer of Steps: 3 Entrance Stairs-Rails: Right Home Layout: One level Home Equipment: Environmental consultant - 2 wheels, Cane - single point, Bedside commode  Functional History: Prior Function Level of Independence: Independent with assistive device(s) Comments: PTA pt was indep with RW/SPC, managing bills, mowing grass, grocery shopping, driving, etc.  Functional Status:  Mobility: Bed Mobility Overal bed mobility: Needs Assistance Bed Mobility: Rolling, Supine to Sit, Sit to Supine Rolling: Supervision Supine to sit: Max assist, HOB elevated Sit to supine: Mod  assist General bed mobility comments: pt requiring heavy max assist to bring trunk into upright position for supine>sit despite HOB elevated and bedrails.   Transfers Overall transfer level: Needs assistance Equipment used: Rolling walker (2 wheeled) Transfers: Sit to/from Stand Sit to Stand: +2 physical assistance, Total assist General transfer comment: pt unable to come to full standing at RW despite +2 total assist.  Decreased forward weight shift 2/2 pain and fear of falling, profound LE weakness unable to power up from seated position.  Ambulation/Gait General Gait Details: unsafe during this session    ADL:    Cognition: Cognition Overall Cognitive Status: Within Functional Limits for tasks assessed Cognition Arousal/Alertness: Awake/alert Behavior During Therapy: WFL for tasks assessed/performed Overall Cognitive Status: Within Functional Limits for tasks assessed General Comments: cognitive function appears WFL, but will need to further assessment during length of stay  Blood pressure (!) 149/54, pulse 62, temperature 98.3 F (36.8 C), temperature source Oral, resp. rate 20, height 5\' 9"  (1.753 m), weight 79.8 kg (176 lb), SpO2 97 %. Physical Exam  Vitals reviewed. Constitutional: He is oriented to person, place, and time.  HENT:  Head: Normocephalic.  Eyes: EOM are normal.  Neck: Normal range of motion. Neck supple. No thyromegaly present.  Cardiovascular:  Cardiac rate controlled  Respiratory: Effort normal and breath sounds normal. No respiratory distress.  GI: Soft. Bowel sounds are normal. He exhibits no distension.  Genitourinary:  Genitourinary Comments: Foley tube in place  Neurological: He is alert and oriented to person, place, and time.   LUE 4+/5. RUE 4/5. RLE: 2-HF, KE and 4- ADF/PF. LLE: 3/5 HF, KE and 4+ ADF/PF.   Skin: Skin is warm and dry.    Results for orders placed or performed during the hospital encounter of 06/22/17 (from the past 24 hour(s))   Glucose, capillary     Status: Abnormal   Collection Time: 06/24/17  7:51 AM  Result Value Ref Range   Glucose-Capillary 113 (H) 65 - 99 mg/dL  Glucose, capillary     Status: Abnormal   Collection Time: 06/24/17 11:53 AM  Result Value Ref Range   Glucose-Capillary 137 (H) 65 - 99 mg/dL  Glucose, capillary     Status: Abnormal   Collection Time: 06/24/17  4:44 PM  Result Value Ref Range   Glucose-Capillary 143 (H) 65 - 99 mg/dL  Glucose, capillary     Status: Abnormal   Collection Time: 06/24/17  9:39 PM  Result Value Ref Range   Glucose-Capillary 123 (H) 65 - 99 mg/dL  CK     Status: Abnormal   Collection Time: 06/25/17  3:25 AM  Result Value Ref Range   Total CK 1,538 (H) 49 - 397 U/L   No results found.  Assessment/Plan: Diagnosis: debility/rhabdomyolysis related to fall/Urosepsis 1. Does the need for close, 24 hr/day medical supervision in concert with the patient's rehab needs make it unreasonable for this patient to be served in a less intensive setting? Yes 2. Co-Morbidities requiring supervision/potential complications: DM2, HTn, PAF,  3. Due to bladder management, bowel management, safety, skin/wound care, disease management, medication administration, pain management and patient education, does the patient require 24 hr/day rehab nursing? Yes 4. Does the patient require coordinated care of a physician, rehab nurse, PT (1-2 hrs/day, 5 days/week) and OT (1-2 hrs/day, 5 days/week) to address physical and functional deficits in the context of the above medical diagnosis(es)? Yes Addressing deficits in the following areas: balance, endurance, locomotion, strength, transferring, bowel/bladder control, bathing, dressing, feeding, grooming, toileting and psychosocial support 5. Can the patient actively participate in an intensive therapy program of at least 3 hrs of therapy per day at least 5 days per week? Yes 6. The potential for patient to make measurable gains while on inpatient  rehab is excellent 7. Anticipated functional outcomes upon discharge from inpatient rehab are modified independent to supervision with PT, modified independent and supervision with OT, n/a with SLP. 8. Estimated rehab length of stay to reach the above functional goals is: 14-20 days 9. Anticipated D/C setting: Home 10. Anticipated post D/C treatments: HH therapy and Outpatient therapy 11. Overall Rehab/Functional Prognosis: excellent  RECOMMENDATIONS: This patient's condition is appropriate for continued rehabilitative care in the following setting: CIR Patient has agreed to participate in recommended program. Yes Note that insurance prior authorization may be required for reimbursement for recommended care.  Comment: Rehab Admissions Coordinator to follow up.  Thanks,  Meredith Staggers, MD, Mellody Drown    Lavon Paganini Angiulli, PA-C 06/25/2017

## 2017-06-26 LAB — CBC
HEMATOCRIT: 38.3 % — AB (ref 39.0–52.0)
HEMOGLOBIN: 12.6 g/dL — AB (ref 13.0–17.0)
MCH: 29.2 pg (ref 26.0–34.0)
MCHC: 32.9 g/dL (ref 30.0–36.0)
MCV: 88.7 fL (ref 78.0–100.0)
Platelets: 117 10*3/uL — ABNORMAL LOW (ref 150–400)
RBC: 4.32 MIL/uL (ref 4.22–5.81)
RDW: 13.3 % (ref 11.5–15.5)
WBC: 9.2 10*3/uL (ref 4.0–10.5)

## 2017-06-26 LAB — BASIC METABOLIC PANEL
Anion gap: 8 (ref 5–15)
BUN: 19 mg/dL (ref 6–20)
CHLORIDE: 105 mmol/L (ref 101–111)
CO2: 24 mmol/L (ref 22–32)
CREATININE: 0.74 mg/dL (ref 0.61–1.24)
Calcium: 7.2 mg/dL — ABNORMAL LOW (ref 8.9–10.3)
GFR calc Af Amer: >60 mL/min (ref >60)
GFR calc non Af Amer: >60 mL/min (ref >60)
Glucose, Bld: 171 mg/dL — ABNORMAL HIGH (ref 65–99)
Potassium: 3.1 mmol/L — ABNORMAL LOW (ref 3.5–5.1)
Sodium: 137 mmol/L (ref 135–145)

## 2017-06-26 LAB — GLUCOSE, CAPILLARY
GLUCOSE-CAPILLARY: 197 mg/dL — AB (ref 65–99)
GLUCOSE-CAPILLARY: 247 mg/dL — AB (ref 65–99)
Glucose-Capillary: 97 mg/dL (ref 65–99)
Glucose-Capillary: 168 mg/dL — ABNORMAL HIGH (ref 65–99)

## 2017-06-26 LAB — CK: CK TOTAL: 631 U/L — AB (ref 49–397)

## 2017-06-26 MED ORDER — POTASSIUM CHLORIDE CRYS ER 20 MEQ PO TBCR
40.0000 meq | EXTENDED_RELEASE_TABLET | Freq: Once | ORAL | Status: AC
  Administered 2017-06-26: 40 meq via ORAL
  Filled 2017-06-26: qty 2

## 2017-06-26 MED ORDER — AMLODIPINE BESYLATE 5 MG PO TABS
5.0000 mg | ORAL_TABLET | Freq: Every day | ORAL | Status: DC
  Administered 2017-06-26 – 2017-06-28 (×3): 5 mg via ORAL
  Filled 2017-06-26 (×3): qty 1

## 2017-06-26 NOTE — Progress Notes (Signed)
PROGRESS NOTE    [name]  GUY:[id number] DOB: 1940-08-25 DOA: 06/22/2017 PCP: Alroy Dust, L.Marlou Sa, MD   Outpatient Specialists:     Brief Narrative:  [name] is a 77 y.o. male with medical history significant of  HTN, afib, NHL s/p chemo/rad, DM type II, history of CVA with right hemiparesis, urological issues including urethral stricture s/p dilations; who presents with complaints of weakness unable to get off the bathroom toilet of his home since approximately 500.   He was found by his daughter around 1500.  Prior to onset of symptoms patient had been in his normal state of health and had watched the [employer] celebrations on his television at home.  At baseline patient is able to complete all of his ADLs and lives alone.   High risk for CVA but appears this episode of weakness may be due to UTI.  Suspect d/c in 1-2 days. mRI pending   Assessment & Plan:   Generalized weakness -Likely due to dehydration and UTI.  -Continue antibiotics for UTI will transition to oral antibiotics 1-04   Acute renal failure, ureteral stricture, urinary retention: Acute.   -Patient with previous history of ureteral stricture requiring dilations.   -ultrasound without evidence of hydronephrosis. -Continue Foley catheter, urology consult appreciated -when he is more ambulatory would be appropriate to DC Foley and start voiding trial then, not ready at this time Renal function improving with IV fluids.  -BPH, continue Flomax -Follow-up with urology Dr.Wrenn  Hypokalemia; replete orally.  Check mg level.     Possible urinary tract infection -Continue ceftriaxone, follow-up urine culture pending.  -Blood cultures negative - renal U/S w/o blockage  Rhabdomyolysis: Acute -improving with hydration -CK was significantly elevated at 12,000 on admission. -related to immobilization.  -Hold statins  Hypertensive urgency -continue metoprolol - Hydralazine IV prn -start Norvasc.    Avoid ace due to AKI.   History of CVA with right hemiparesis -Reports worsening of right leg weakness in the recent week -MRI negative for acute stroke.  - Continue aspirin and Plavix  Diabetes mellitus type 2 :  - hemoglobin A1c Is 7 - Hold metformin and repaglinide  - Continue Lantus    Mild elevation of troponin -clinically no evidence of ACS, EKG nonacute and no symptoms -likely due to demand from tolerated hypertension.  Paroxysmal atrial fibrillation:  - on dual antiplatelet therapy.   Thrombocytopenia: Chronic. Platelet count 122 on admission - stable, monitor  Hyperlipidemia - holding statins due to elevation CK.   BPH - Continue Flomax  History of non-Hodgkin's lymphoma:  -Patient reports being status post remote radiation and chemotherapy.   DVT prophylaxis: Lovenox  Code Status:Full Code Family Communication: care discussed with patient.  Disposition Plan:  likley CIR     Consultants:   Urology   CIR   Subjective: He is complaining of foot pain.  Weakness improving. Still he is not able to ambulate   Objective: Vitals:   06/25/17 1524 06/25/17 2224 06/25/17 2308 06/26/17 0649  BP: (!) 158/50 (!) 176/47 (!) 168/60 (!) 171/47  Pulse: 63 (!) 50 64 64  Resp: 18 20  20   Temp: 98.3 F (36.8 C) 98.5 F (36.9 C)  98.7 F (37.1 C)  TempSrc: Oral Oral  Oral  SpO2: 97% 97%  93%  Weight:      Height:        Intake/Output Summary (Last 24 hours) at 06/26/2017 0805 Last data filed at 06/26/2017 0533 Gross per 24 hour  Intake 2536.25 ml  Output 1050 ml  Net 1486.25 ml   Filed Weights   06/22/17 1649  Weight: 79.8 kg (176 lb)    Examination:  Gen: NAD HEENT:PERLA, neck supple Lungs:normal respiratory effort, CTA CVS; S 1, S 2 RRR Abd: BS present, soft, nt Extremities: no cyanosis.  Skin: no new rashes Neuro: RLE weakness 3/5, rest 4+/5     Data Reviewed: I have personally reviewed following labs and imaging  studies  CBC: Recent Labs  Lab 06/22/17 1740 06/23/17 0325 06/24/17 0505 06/26/17 0237  WBC 15.2* 13.3* 14.1* 9.2  NEUTROABS 12.9*  --   --   --   HGB 17.5* 14.4 14.3 12.6*  HCT 50.0 41.2 42.9 38.3*  MCV 89.8 89.4 91.3 88.7  PLT 122* 127* 107* 626*   Basic Metabolic Panel: Recent Labs  Lab 06/22/17 1740 06/23/17 0325 06/24/17 0505 06/26/17 0237  NA 136 136 140 137  K 4.5 4.4 4.1 3.1*  CL 96* 108 112* 105  CO2 23 19* 23 24  GLUCOSE 208* 199* 147* 171*  BUN 33* 39* 24* 19  CREATININE 1.88* 1.43* 0.94 0.74  CALCIUM 8.8* 7.5* 7.7* 7.2*   GFR: Estimated Creatinine Clearance: 78.6 mL/min (by C-G formula based on SCr of 0.74 mg/dL). Liver Function Tests: Recent Labs  Lab 06/23/17 0325 06/24/17 0505  AST 205* 113*  ALT 58 54  ALKPHOS 50 45  BILITOT 0.7 0.7  PROT 5.2* 4.8*  ALBUMIN 2.7* 2.3*   No results for input(s): LIPASE, AMYLASE in the last 168 hours. No results for input(s): AMMONIA in the last 168 hours. Coagulation Profile: No results for input(s): INR, PROTIME in the last 168 hours. Cardiac Enzymes: Recent Labs  Lab 06/22/17 1740 06/23/17 0330 06/23/17 0656 06/23/17 1314 06/24/17 0505 06/25/17 0325 06/26/17 0237  CKTOTAL 12,430*  --   --  7,663* 3,446* 1,538* 631*  TROPONINI  --  0.15* 0.13* 0.10*  --   --   --    BNP (last 3 results) No results for input(s): PROBNP in the last 8760 hours. HbA1C: No results for input(s): HGBA1C in the last 72 hours. CBG: Recent Labs  Lab 06/25/17 0800 06/25/17 1215 06/25/17 1636 06/25/17 2224 06/26/17 0802  GLUCAP 105* 144* 155* 125* 97   Lipid Profile: No results for input(s): CHOL, HDL, LDLCALC, TRIG, CHOLHDL, LDLDIRECT in the last 72 hours. Thyroid Function Tests: No results for input(s): TSH, T4TOTAL, FREET4, T3FREE, THYROIDAB in the last 72 hours. Anemia Panel: No results for input(s): VITAMINB12, FOLATE, FERRITIN, TIBC, IRON, RETICCTPCT in the last 72 hours. Urine analysis:    Component Value  Date/Time   COLORURINE YELLOW 06/22/2017 2207   APPEARANCEUR HAZY (A) 06/22/2017 2207   LABSPEC 1.011 06/22/2017 2207   PHURINE 5.0 06/22/2017 2207   GLUCOSEU 50 (A) 06/22/2017 2207   HGBUR LARGE (A) 06/22/2017 2207   BILIRUBINUR NEGATIVE 06/22/2017 2207   KETONESUR 5 (A) 06/22/2017 2207   PROTEINUR 100 (A) 06/22/2017 2207   UROBILINOGEN 0.2 05/29/2014 2125   NITRITE NEGATIVE 06/22/2017 2207   LEUKOCYTESUR MODERATE (A) 06/22/2017 2207     ) Recent Results (from the past 240 hour(s))  Urine culture     Status: None (Preliminary result)   Collection Time: 06/22/17 10:54 PM  Result Value Ref Range Status   Specimen Description URINE, CATHETERIZED  Final   Special Requests NONE  Final   Culture CULTURE REINCUBATED FOR BETTER GROWTH  Final   Report Status PENDING  Incomplete  Blood culture (routine  x 2)     Status: None (Preliminary result)   Collection Time: 06/22/17 11:50 PM  Result Value Ref Range Status   Specimen Description BLOOD RIGHT HAND  Final   Special Requests IN PEDIATRIC BOTTLE Blood Culture adequate volume  Final   Culture NO GROWTH 2 DAYS  Final   Report Status PENDING  Incomplete  Blood culture (routine x 2)     Status: None (Preliminary result)   Collection Time: 06/22/17 11:54 PM  Result Value Ref Range Status   Specimen Description BLOOD RIGHT HAND  Final   Special Requests IN PEDIATRIC BOTTLE Blood Culture adequate volume  Final   Culture NO GROWTH 2 DAYS  Final   Report Status PENDING  Incomplete      Anti-infectives (From admission, onward)   Start     Dose/Rate Route Frequency Ordered Stop   06/23/17 2200  cefTRIAXone (ROCEPHIN) 1 g in dextrose 5 % 50 mL IVPB     1 g 100 mL/hr over 30 Minutes Intravenous Every 24 hours 06/23/17 0139     06/22/17 2300  cefTRIAXone (ROCEPHIN) 1 g in dextrose 5 % 50 mL IVPB     1 g 100 mL/hr over 30 Minutes Intravenous  Once 06/22/17 2254 06/23/17 0032       Radiology Studies: Mr Brain Wo Contrast  Result Date:  06/25/2017 CLINICAL DATA:  Non-Hodgkin's lymphoma. History of stroke with right-sided weakness. Worsening weakness beginning today. EXAM: MRI HEAD WITHOUT CONTRAST TECHNIQUE: Multiplanar, multiecho pulse sequences of the brain and surrounding structures were obtained without intravenous contrast. COMPARISON:  CT 09/23/2015.  MRI 08/15/2015. FINDINGS: Brain: Diffusion imaging does not show any acute or subacute infarction. Chronic small-vessel ischemic changes are seen throughout the pons. There are old cerebellar infarctions. Cerebral hemispheres show generalized atrophy with moderate chronic small-vessel ischemic changes affecting the deep and subcortical white matter. No large vessel territory infarction. Focus of hemosiderin deposition in the left posterior parietal white matter consistent with an old micro hemorrhagic insult. No sign of acute hemorrhage. No mass lesion, hydrocephalus or extra-axial collection. Vascular: Major vessels at the base of the brain show flow. Skull and upper cervical spine: Negative Sinuses/Orbits: Clear/normal Other: None IMPRESSION: No acute or reversible finding. Generalized atrophy. Extensive chronic ischemic changes throughout the brain as outlined above. Similar appearance to the study of 2017, findings mildly progressive. Electronically Signed   By: Nelson Chimes M.D.   On: 06/25/2017 15:15        Scheduled Meds: . aspirin EC  81 mg Oral Once per day on Mon Wed Fri  . clopidogrel  75 mg Oral Daily  . enoxaparin (LOVENOX) injection  40 mg Subcutaneous Q24H  . insulin aspart  0-5 Units Subcutaneous QHS  . insulin aspart  0-9 Units Subcutaneous TID WC  . insulin glargine  12 Units Subcutaneous Daily  . metoprolol tartrate  25 mg Oral BID  . potassium chloride  40 mEq Oral Once  . tamsulosin  0.8 mg Oral Daily   Continuous Infusions: . sodium chloride 75 mL/hr at 06/26/17 0037  . cefTRIAXone (ROCEPHIN)  IV Stopped (06/26/17 0106)     LOS: 3 days    Time  spent: 25 min    Elmarie Shiley, MD [facility] Page via [email or web address], password [email or web address]  If 7PM-7AM, please contact night-coverage  06/26/2017, 8:05 AM

## 2017-06-27 LAB — BASIC METABOLIC PANEL
ANION GAP: 3 — AB (ref 5–15)
BUN: 15 mg/dL (ref 6–20)
CHLORIDE: 108 mmol/L (ref 101–111)
CO2: 25 mmol/L (ref 22–32)
Calcium: 7.5 mg/dL — ABNORMAL LOW (ref 8.9–10.3)
Creatinine, Ser: 0.66 mg/dL (ref 0.61–1.24)
GFR calc non Af Amer: >60 mL/min (ref >60)
Glucose, Bld: 141 mg/dL — ABNORMAL HIGH (ref 65–99)
POTASSIUM: 3.3 mmol/L — AB (ref 3.5–5.1)
SODIUM: 136 mmol/L (ref 135–145)

## 2017-06-27 LAB — GLUCOSE, CAPILLARY
GLUCOSE-CAPILLARY: 180 mg/dL — AB (ref 65–99)
GLUCOSE-CAPILLARY: 263 mg/dL — AB (ref 65–99)
Glucose-Capillary: 114 mg/dL — ABNORMAL HIGH (ref 65–99)
Glucose-Capillary: 161 mg/dL — ABNORMAL HIGH (ref 65–99)

## 2017-06-27 LAB — CK: Total CK: 280 U/L (ref 49–397)

## 2017-06-27 LAB — MAGNESIUM: Magnesium: 1.5 mg/dL — ABNORMAL LOW (ref 1.7–2.4)

## 2017-06-27 MED ORDER — POTASSIUM CHLORIDE 20 MEQ PO PACK
40.0000 meq | PACK | Freq: Once | ORAL | Status: AC
  Administered 2017-06-27: 40 meq via ORAL
  Filled 2017-06-27: qty 2

## 2017-06-27 MED ORDER — MAGNESIUM SULFATE 2 GM/50ML IV SOLN
2.0000 g | Freq: Once | INTRAVENOUS | Status: AC
  Administered 2017-06-27: 2 g via INTRAVENOUS
  Filled 2017-06-27: qty 50

## 2017-06-27 NOTE — Progress Notes (Signed)
PROGRESS NOTE    [name]  GUY:[id number] DOB: 1940-06-26 DOA: 06/22/2017 PCP: Alroy Dust, L.Marlou Sa, MD   Outpatient Specialists:     Brief Narrative:  [name] is a 77 y.o. male with medical history significant of  HTN, afib, NHL s/p chemo/rad, DM type II, history of CVA with right hemiparesis, urological issues including urethral stricture s/p dilations; who presents with complaints of weakness unable to get off the bathroom toilet of his home since approximately 500.   He was found by his daughter around 1500.  Prior to onset of symptoms patient had been in his normal state of health and had watched the [employer] celebrations on his television at home.  At baseline patient is able to complete all of his ADLs and lives alone.   High risk for CVA but appears this episode of weakness may be due to UTI.  Suspect d/c in 1-2 days. mRI pending   Assessment & Plan:   Generalized weakness -Likely due to dehydration and UTI.  -Continue antibiotics for UTI will transition to oral antibiotics 1-04   Acute renal failure, ureteral stricture, urinary retention: Acute.   -Patient with previous history of ureteral stricture requiring dilations.   -ultrasound without evidence of hydronephrosis. -Continue Foley catheter, urology consult appreciated -when he is more ambulatory would be appropriate to DC Foley and start voiding trial then, not ready at this time Renal function improved  with IV fluids.  -BPH, continue Flomax -Follow-up with urology Dr.Wrenn  Hypokalemia; replete orally.  Replete mg. IV     Possible urinary tract infection -Continue ceftriaxone, follow-up urine culture still  pending.  -Blood cultures negative - renal U/S w/o blockage day 5 IV antibiotics. Still has foley catheter   Rhabdomyolysis: Acute -improving with hydration -CK was significantly elevated at 12,000 on admission. -related to immobilization.  -Hold statins  Hypertensive urgency -continue  metoprolol - Hydralazine IV prn -improved with Norvasc.  Avoid ace due to AKI.   History of CVA with right hemiparesis -Reports worsening of right leg weakness in the recent week -MRI negative for acute stroke.  - Continue aspirin and Plavix  Diabetes mellitus type 2 :  - hemoglobin A1c Is 7 - Hold metformin and repaglinide  - Continue Lantus    Mild elevation of troponin -clinically no evidence of ACS, EKG nonacute and no symptoms -likely due to demand from tolerated hypertension.  Paroxysmal atrial fibrillation:  - on dual antiplatelet therapy.   Thrombocytopenia: Chronic. Platelet count 122 on admission - stable, monitor  Hyperlipidemia - holding statins due to elevation CK.   BPH - Continue Flomax  History of non-Hodgkin's lymphoma:  -Patient reports being status post remote radiation and chemotherapy.   DVT prophylaxis: Lovenox  Code Status:Full Code Family Communication: care discussed with patient.  Disposition Plan:  likley CIR     Consultants:   Urology   CIR   Subjective: He feels stronger, but still not able to ambulate.   Objective: Vitals:   06/26/17 1417 06/26/17 2126 06/26/17 2126 06/27/17 0533  BP: (!) 146/47 (!) 194/53 (!) 194/53 (!) 130/57  Pulse: 67 62 62 64  Resp: 20 20 20 18   Temp: (!) 97.5 F (36.4 C) 98.6 F (37 C) 98.6 F (37 C) 98.2 F (36.8 C)  TempSrc: Oral Oral Oral Oral  SpO2: 93% 100% 100% 96%  Weight:      Height:        Intake/Output Summary (Last 24 hours) at 06/27/2017 1114 Last data filed  at 06/27/2017 1032 Gross per 24 hour  Intake 1580 ml  Output 2550 ml  Net -970 ml   Filed Weights   06/22/17 1649  Weight: 79.8 kg (176 lb)    Examination:  Gen: NAD HEENT: PERLA,.  Lungs: CTA CVS; S 1, S 2 RRR Abd: BS present, soft, nt Extremities: no edema, no cyanosis.  Neuro: RLE weakness 3/5, rest 4+/5     Data Reviewed: I have personally reviewed following labs and imaging studies  CBC: Recent  Labs  Lab 06/22/17 1740 06/23/17 0325 06/24/17 0505 06/26/17 0237  WBC 15.2* 13.3* 14.1* 9.2  NEUTROABS 12.9*  --   --   --   HGB 17.5* 14.4 14.3 12.6*  HCT 50.0 41.2 42.9 38.3*  MCV 89.8 89.4 91.3 88.7  PLT 122* 127* 107* 765*   Basic Metabolic Panel: Recent Labs  Lab 06/22/17 1740 06/23/17 0325 06/24/17 0505 06/26/17 0237 06/27/17 0230  NA 136 136 140 137 136  K 4.5 4.4 4.1 3.1* 3.3*  CL 96* 108 112* 105 108  CO2 23 19* 23 24 25   GLUCOSE 208* 199* 147* 171* 141*  BUN 33* 39* 24* 19 15  CREATININE 1.88* 1.43* 0.94 0.74 0.66  CALCIUM 8.8* 7.5* 7.7* 7.2* 7.5*  MG  --   --   --   --  1.5*   GFR: Estimated Creatinine Clearance: 78.6 mL/min (by C-G formula based on SCr of 0.66 mg/dL). Liver Function Tests: Recent Labs  Lab 06/23/17 0325 06/24/17 0505  AST 205* 113*  ALT 58 54  ALKPHOS 50 45  BILITOT 0.7 0.7  PROT 5.2* 4.8*  ALBUMIN 2.7* 2.3*   No results for input(s): LIPASE, AMYLASE in the last 168 hours. No results for input(s): AMMONIA in the last 168 hours. Coagulation Profile: No results for input(s): INR, PROTIME in the last 168 hours. Cardiac Enzymes: Recent Labs  Lab 06/23/17 0330 06/23/17 0656 06/23/17 1314 06/24/17 0505 06/25/17 0325 06/26/17 0237 06/27/17 0230  CKTOTAL  --   --  7,663* 3,446* 1,538* 631* 280  TROPONINI 0.15* 0.13* 0.10*  --   --   --   --    BNP (last 3 results) No results for input(s): PROBNP in the last 8760 hours. HbA1C: No results for input(s): HGBA1C in the last 72 hours. CBG: Recent Labs  Lab 06/26/17 0802 06/26/17 1205 06/26/17 1642 06/26/17 2130 06/27/17 0805  GLUCAP 97 168* 197* 247* 114*   Lipid Profile: No results for input(s): CHOL, HDL, LDLCALC, TRIG, CHOLHDL, LDLDIRECT in the last 72 hours. Thyroid Function Tests: No results for input(s): TSH, T4TOTAL, FREET4, T3FREE, THYROIDAB in the last 72 hours. Anemia Panel: No results for input(s): VITAMINB12, FOLATE, FERRITIN, TIBC, IRON, RETICCTPCT in the  last 72 hours. Urine analysis:    Component Value Date/Time   COLORURINE YELLOW 06/22/2017 2207   APPEARANCEUR HAZY (A) 06/22/2017 2207   LABSPEC 1.011 06/22/2017 2207   PHURINE 5.0 06/22/2017 2207   GLUCOSEU 50 (A) 06/22/2017 2207   HGBUR LARGE (A) 06/22/2017 2207   BILIRUBINUR NEGATIVE 06/22/2017 2207   KETONESUR 5 (A) 06/22/2017 2207   PROTEINUR 100 (A) 06/22/2017 2207   UROBILINOGEN 0.2 05/29/2014 2125   NITRITE NEGATIVE 06/22/2017 2207   LEUKOCYTESUR MODERATE (A) 06/22/2017 2207     ) Recent Results (from the past 240 hour(s))  Urine culture     Status: None (Preliminary result)   Collection Time: 06/22/17 10:54 PM  Result Value Ref Range Status   Specimen Description URINE, CATHETERIZED  Final   Special Requests NONE  Final   Culture CULTURE REINCUBATED FOR BETTER GROWTH  Final   Report Status PENDING  Incomplete  Blood culture (routine x 2)     Status: None (Preliminary result)   Collection Time: 06/22/17 11:50 PM  Result Value Ref Range Status   Specimen Description BLOOD RIGHT HAND  Final   Special Requests IN PEDIATRIC BOTTLE Blood Culture adequate volume  Final   Culture NO GROWTH 3 DAYS  Final   Report Status PENDING  Incomplete  Blood culture (routine x 2)     Status: None (Preliminary result)   Collection Time: 06/22/17 11:54 PM  Result Value Ref Range Status   Specimen Description BLOOD RIGHT HAND  Final   Special Requests IN PEDIATRIC BOTTLE Blood Culture adequate volume  Final   Culture NO GROWTH 3 DAYS  Final   Report Status PENDING  Incomplete      Anti-infectives (From admission, onward)   Start     Dose/Rate Route Frequency Ordered Stop   06/23/17 2200  cefTRIAXone (ROCEPHIN) 1 g in dextrose 5 % 50 mL IVPB     1 g 100 mL/hr over 30 Minutes Intravenous Every 24 hours 06/23/17 0139     06/22/17 2300  cefTRIAXone (ROCEPHIN) 1 g in dextrose 5 % 50 mL IVPB     1 g 100 mL/hr over 30 Minutes Intravenous  Once 06/22/17 2254 06/23/17 0032        Radiology Studies: Mr Brain Wo Contrast  Result Date: 06/25/2017 CLINICAL DATA:  Non-Hodgkin's lymphoma. History of stroke with right-sided weakness. Worsening weakness beginning today. EXAM: MRI HEAD WITHOUT CONTRAST TECHNIQUE: Multiplanar, multiecho pulse sequences of the brain and surrounding structures were obtained without intravenous contrast. COMPARISON:  CT 09/23/2015.  MRI 08/15/2015. FINDINGS: Brain: Diffusion imaging does not show any acute or subacute infarction. Chronic small-vessel ischemic changes are seen throughout the pons. There are old cerebellar infarctions. Cerebral hemispheres show generalized atrophy with moderate chronic small-vessel ischemic changes affecting the deep and subcortical white matter. No large vessel territory infarction. Focus of hemosiderin deposition in the left posterior parietal white matter consistent with an old micro hemorrhagic insult. No sign of acute hemorrhage. No mass lesion, hydrocephalus or extra-axial collection. Vascular: Major vessels at the base of the brain show flow. Skull and upper cervical spine: Negative Sinuses/Orbits: Clear/normal Other: None IMPRESSION: No acute or reversible finding. Generalized atrophy. Extensive chronic ischemic changes throughout the brain as outlined above. Similar appearance to the study of 2017, findings mildly progressive. Electronically Signed   By: Nelson Chimes M.D.   On: 06/25/2017 15:15        Scheduled Meds: . amLODipine  5 mg Oral Daily  . aspirin EC  81 mg Oral Once per day on Mon Wed Fri  . clopidogrel  75 mg Oral Daily  . enoxaparin (LOVENOX) injection  40 mg Subcutaneous Q24H  . insulin aspart  0-5 Units Subcutaneous QHS  . insulin aspart  0-9 Units Subcutaneous TID WC  . insulin glargine  12 Units Subcutaneous Daily  . metoprolol tartrate  25 mg Oral BID  . tamsulosin  0.8 mg Oral Daily   Continuous Infusions: . cefTRIAXone (ROCEPHIN)  IV Stopped (06/26/17 2214)     LOS: 4 days     Time spent: 25 min    Elmarie Shiley, MD [facility] Page via [email or web address], password [email or web address]  If 7PM-7AM, please contact night-coverage  06/27/2017, 11:14 AM

## 2017-06-28 ENCOUNTER — Encounter (HOSPITAL_COMMUNITY): Payer: Self-pay | Admitting: Nurse Practitioner

## 2017-06-28 ENCOUNTER — Inpatient Hospital Stay (HOSPITAL_COMMUNITY)
Admission: RE | Admit: 2017-06-28 | Discharge: 2017-07-09 | DRG: 057 | Disposition: A | Discharge status: Home Health | Payer: Medicare Other | Source: Intra-hospital | Attending: Physical Medicine & Rehabilitation | Admitting: Physical Medicine & Rehabilitation

## 2017-06-28 DIAGNOSIS — I69351 Hemiplegia and hemiparesis following cerebral infarction affecting right dominant side: Secondary | ICD-10-CM | POA: Diagnosis present

## 2017-06-28 DIAGNOSIS — R5381 Other malaise: Secondary | ICD-10-CM | POA: Diagnosis present

## 2017-06-28 DIAGNOSIS — Z7982 Long term (current) use of aspirin: Secondary | ICD-10-CM

## 2017-06-28 DIAGNOSIS — J040 Acute laryngitis: Secondary | ICD-10-CM | POA: Diagnosis not present

## 2017-06-28 DIAGNOSIS — I480 Paroxysmal atrial fibrillation: Secondary | ICD-10-CM | POA: Diagnosis present

## 2017-06-28 DIAGNOSIS — E46 Unspecified protein-calorie malnutrition: Secondary | ICD-10-CM | POA: Diagnosis not present

## 2017-06-28 DIAGNOSIS — J019 Acute sinusitis, unspecified: Secondary | ICD-10-CM | POA: Diagnosis not present

## 2017-06-28 DIAGNOSIS — Z87440 Personal history of urinary (tract) infections: Secondary | ICD-10-CM

## 2017-06-28 DIAGNOSIS — I10 Essential (primary) hypertension: Secondary | ICD-10-CM | POA: Diagnosis present

## 2017-06-28 DIAGNOSIS — Z888 Allergy status to other drugs, medicaments and biological substances status: Secondary | ICD-10-CM

## 2017-06-28 DIAGNOSIS — Z9221 Personal history of antineoplastic chemotherapy: Secondary | ICD-10-CM | POA: Diagnosis not present

## 2017-06-28 DIAGNOSIS — E1142 Type 2 diabetes mellitus with diabetic polyneuropathy: Secondary | ICD-10-CM | POA: Diagnosis present

## 2017-06-28 DIAGNOSIS — B952 Enterococcus as the cause of diseases classified elsewhere: Secondary | ICD-10-CM | POA: Diagnosis not present

## 2017-06-28 DIAGNOSIS — R059 Cough, unspecified: Secondary | ICD-10-CM

## 2017-06-28 DIAGNOSIS — Z880 Allergy status to penicillin: Secondary | ICD-10-CM

## 2017-06-28 DIAGNOSIS — R32 Unspecified urinary incontinence: Secondary | ICD-10-CM | POA: Diagnosis not present

## 2017-06-28 DIAGNOSIS — Z79899 Other long term (current) drug therapy: Secondary | ICD-10-CM

## 2017-06-28 DIAGNOSIS — Z87448 Personal history of other diseases of urinary system: Secondary | ICD-10-CM

## 2017-06-28 DIAGNOSIS — N179 Acute kidney failure, unspecified: Secondary | ICD-10-CM

## 2017-06-28 DIAGNOSIS — R3915 Urgency of urination: Secondary | ICD-10-CM | POA: Diagnosis not present

## 2017-06-28 DIAGNOSIS — N35819 Other urethral stricture, male, unspecified site: Secondary | ICD-10-CM | POA: Diagnosis present

## 2017-06-28 DIAGNOSIS — Z923 Personal history of irradiation: Secondary | ICD-10-CM | POA: Diagnosis not present

## 2017-06-28 DIAGNOSIS — Z7902 Long term (current) use of antithrombotics/antiplatelets: Secondary | ICD-10-CM | POA: Diagnosis not present

## 2017-06-28 DIAGNOSIS — E8809 Other disorders of plasma-protein metabolism, not elsewhere classified: Secondary | ICD-10-CM | POA: Diagnosis present

## 2017-06-28 DIAGNOSIS — N390 Urinary tract infection, site not specified: Secondary | ICD-10-CM

## 2017-06-28 DIAGNOSIS — Z794 Long term (current) use of insulin: Secondary | ICD-10-CM

## 2017-06-28 DIAGNOSIS — D62 Acute posthemorrhagic anemia: Secondary | ICD-10-CM

## 2017-06-28 DIAGNOSIS — Z8249 Family history of ischemic heart disease and other diseases of the circulatory system: Secondary | ICD-10-CM

## 2017-06-28 DIAGNOSIS — Z8572 Personal history of non-Hodgkin lymphomas: Secondary | ICD-10-CM | POA: Diagnosis not present

## 2017-06-28 DIAGNOSIS — T796XXS Traumatic ischemia of muscle, sequela: Secondary | ICD-10-CM

## 2017-06-28 DIAGNOSIS — Z7983 Long term (current) use of bisphosphonates: Secondary | ICD-10-CM

## 2017-06-28 DIAGNOSIS — Z87891 Personal history of nicotine dependence: Secondary | ICD-10-CM

## 2017-06-28 DIAGNOSIS — R339 Retention of urine, unspecified: Secondary | ICD-10-CM

## 2017-06-28 DIAGNOSIS — N39490 Overflow incontinence: Secondary | ICD-10-CM

## 2017-06-28 DIAGNOSIS — E785 Hyperlipidemia, unspecified: Secondary | ICD-10-CM | POA: Diagnosis present

## 2017-06-28 DIAGNOSIS — Z981 Arthrodesis status: Secondary | ICD-10-CM | POA: Diagnosis not present

## 2017-06-28 DIAGNOSIS — R05 Cough: Secondary | ICD-10-CM

## 2017-06-28 DIAGNOSIS — J029 Acute pharyngitis, unspecified: Secondary | ICD-10-CM

## 2017-06-28 DIAGNOSIS — E1165 Type 2 diabetes mellitus with hyperglycemia: Secondary | ICD-10-CM

## 2017-06-28 LAB — BASIC METABOLIC PANEL
Anion gap: 6 (ref 5–15)
BUN: 15 mg/dL (ref 6–20)
CO2: 26 mmol/L (ref 22–32)
CREATININE: 0.62 mg/dL (ref 0.61–1.24)
Calcium: 7.8 mg/dL — ABNORMAL LOW (ref 8.9–10.3)
Chloride: 105 mmol/L (ref 101–111)
Glucose, Bld: 116 mg/dL — ABNORMAL HIGH (ref 65–99)
POTASSIUM: 3.6 mmol/L (ref 3.5–5.1)
Sodium: 137 mmol/L (ref 135–145)

## 2017-06-28 LAB — GLUCOSE, CAPILLARY
GLUCOSE-CAPILLARY: 104 mg/dL — AB (ref 65–99)
GLUCOSE-CAPILLARY: 217 mg/dL — AB (ref 65–99)
Glucose-Capillary: 198 mg/dL — ABNORMAL HIGH (ref 65–99)
Glucose-Capillary: 258 mg/dL — ABNORMAL HIGH (ref 65–99)

## 2017-06-28 LAB — CULTURE, BLOOD (ROUTINE X 2)
CULTURE: NO GROWTH
Culture: NO GROWTH
SPECIAL REQUESTS: ADEQUATE
SPECIAL REQUESTS: ADEQUATE

## 2017-06-28 LAB — URINE CULTURE: Culture: >=100000 — AB

## 2017-06-28 LAB — MAGNESIUM: MAGNESIUM: 1.8 mg/dL (ref 1.7–2.4)

## 2017-06-28 MED ORDER — AMLODIPINE BESYLATE 5 MG PO TABS: 5.0000 mg | ORAL_TABLET | Freq: Every day | ORAL | 0 refills | Status: DC

## 2017-06-28 MED ORDER — ACETAMINOPHEN 325 MG PO TABS
650.0000 mg | ORAL_TABLET | Freq: Four times a day (QID) | ORAL | Status: DC | PRN
  Administered 2017-06-30: 650 mg via ORAL
  Filled 2017-06-28: qty 2

## 2017-06-28 MED ORDER — ASPIRIN EC 81 MG PO TBEC
81.0000 mg | DELAYED_RELEASE_TABLET | ORAL | Status: DC
Start: 2017-06-30 — End: 2017-07-09
  Administered 2017-06-30 – 2017-07-09 (×5): 81 mg via ORAL
  Filled 2017-06-28 (×3): qty 1

## 2017-06-28 MED ORDER — CLOPIDOGREL BISULFATE 75 MG PO TABS
75.0000 mg | ORAL_TABLET | Freq: Every day | ORAL | Status: DC
  Administered 2017-06-29 – 2017-07-09 (×11): 75 mg via ORAL
  Filled 2017-06-28 (×11): qty 1

## 2017-06-28 MED ORDER — SORBITOL 70 % SOLN: 30.0000 mL | Freq: Every day | Status: DC | PRN

## 2017-06-28 MED ORDER — TAMSULOSIN HCL 0.4 MG PO CAPS
0.8000 mg | ORAL_CAPSULE | Freq: Every day | ORAL | Status: DC
  Administered 2017-06-29 – 2017-07-09 (×11): 0.8 mg via ORAL
  Filled 2017-06-28 (×12): qty 2

## 2017-06-28 MED ORDER — METOPROLOL TARTRATE 25 MG PO TABS: 25.0000 mg | ORAL_TABLET | Freq: Two times a day (BID) | ORAL | 0 refills | Status: DC

## 2017-06-28 MED ORDER — METOPROLOL TARTRATE 25 MG PO TABS
25.0000 mg | ORAL_TABLET | Freq: Two times a day (BID) | ORAL | Status: DC
  Administered 2017-06-28 – 2017-07-09 (×20): 25 mg via ORAL
  Filled 2017-06-28 (×22): qty 1

## 2017-06-28 MED ORDER — ENOXAPARIN SODIUM 40 MG/0.4ML ~~LOC~~ SOLN: 40.0000 mg | SUBCUTANEOUS | Status: DC

## 2017-06-28 MED ORDER — DEXTROSE 5 % IV SOLN: 1.0000 g | INTRAVENOUS | 0 refills | Status: DC

## 2017-06-28 MED ORDER — ACETAMINOPHEN 650 MG RE SUPP: 650.0000 mg | Freq: Four times a day (QID) | RECTAL | Status: DC | PRN

## 2017-06-28 MED ORDER — INSULIN ASPART 100 UNIT/ML ~~LOC~~ SOLN
0.0000 [IU] | Freq: Every day | SUBCUTANEOUS | Status: DC
  Administered 2017-06-28 – 2017-06-30 (×2): 3 [IU] via SUBCUTANEOUS
  Administered 2017-07-01 – 2017-07-04 (×3): 2 [IU] via SUBCUTANEOUS
  Administered 2017-07-06: 3 [IU] via SUBCUTANEOUS
  Administered 2017-07-07 – 2017-07-08 (×2): 2 [IU] via SUBCUTANEOUS

## 2017-06-28 MED ORDER — ALBUTEROL SULFATE (2.5 MG/3ML) 0.083% IN NEBU: 2.5000 mg | INHALATION_SOLUTION | RESPIRATORY_TRACT | Status: DC | PRN

## 2017-06-28 MED ORDER — AMLODIPINE BESYLATE 5 MG PO TABS
5.0000 mg | ORAL_TABLET | Freq: Every day | ORAL | Status: DC
  Administered 2017-06-29 – 2017-06-30 (×2): 5 mg via ORAL
  Filled 2017-06-28 (×2): qty 1

## 2017-06-28 MED ORDER — ENOXAPARIN SODIUM 40 MG/0.4ML ~~LOC~~ SOLN
40.0000 mg | SUBCUTANEOUS | Status: DC
  Administered 2017-06-29 – 2017-07-08 (×10): 40 mg via SUBCUTANEOUS
  Filled 2017-06-28 (×10): qty 0.4

## 2017-06-28 MED ORDER — ONDANSETRON HCL 4 MG/2ML IJ SOLN: 4.0000 mg | Freq: Four times a day (QID) | INTRAMUSCULAR | Status: DC | PRN

## 2017-06-28 MED ORDER — ONDANSETRON HCL 4 MG PO TABS: 4.0000 mg | ORAL_TABLET | Freq: Four times a day (QID) | ORAL | Status: DC | PRN

## 2017-06-28 MED ORDER — SIMETHICONE 80 MG PO CHEW
80.0000 mg | CHEWABLE_TABLET | Freq: Four times a day (QID) | ORAL | Status: DC | PRN
  Administered 2017-07-01 – 2017-07-02 (×2): 80 mg via ORAL
  Filled 2017-06-28 (×2): qty 1

## 2017-06-28 MED ORDER — INSULIN GLARGINE 100 UNIT/ML ~~LOC~~ SOLN
12.0000 [IU] | Freq: Every day | SUBCUTANEOUS | Status: DC
  Administered 2017-06-29 – 2017-07-09 (×11): 12 [IU] via SUBCUTANEOUS
  Filled 2017-06-28 (×11): qty 0.12

## 2017-06-28 MED ORDER — LOPERAMIDE HCL 2 MG PO CAPS: 2.0000 mg | ORAL_CAPSULE | Freq: Every day | ORAL | Status: DC | PRN

## 2017-06-28 NOTE — Progress Notes (Signed)
Patient arrived and oriented to unit. No complaints of pain at this time. Questions answered. Resting comfortably with call bell in place.

## 2017-06-28 NOTE — Progress Notes (Signed)
Inpatient Rehabilitation  Continuing to follow for timing of medical readiness and insurance authorization.  I have called [facility] Medicare and await a call back with a decision today.  Will update that team as soon as I know.  Call if questions.   Carmelia Roller., CCC/SLP Admission Coordinator  [place]  Cell [phone]

## 2017-06-28 NOTE — Progress Notes (Signed)
Occupational Therapy Treatment Patient Details Name: [name] MRN: [id number] DOB: [birth year] Today's Date: 06/28/2017    History of present illness Pt is a 77 y/o male admitted with profound weakness of sudden onset, workup pending (UTI (-)).  PMH HTN, Afib, NHL s/p chemo/radiation, DM, CVA with R hemiparesis   OT comments  Pt making steady progress and is very motivated to work with therpy and return to PLOF. Pt overall Mod A with LB ADL and mod A with mobility from lower surface. Continue to recommend CIR for DC.   Follow Up Recommendations  CIR    Equipment Recommendations       Recommendations for Other Services Rehab consult    Precautions / Restrictions Precautions Precautions: Fall       Mobility Bed Mobility Overal bed mobility: Needs Assistance       Supine to sit: Min assist Sit to supine: Min assist      Transfers Overall transfer level: Needs assistance Equipment used: Rolling walker (2 wheeled) Transfers: Sit to/from Stand Sit to Stand: Min assist;From elevated surface         General transfer comment: vc for Cleora Karnik weight shift prio rot stand; A to control descent    Balance Overall balance assessment: Needs assistance   Sitting balance-Leahy Scale: Fair Sitting balance - Comments: posterior lean at times     Standing balance-Leahy Scale: Poor Standing balance comment: Reliant on UE support.                            ADL either performed or assessed with clinical judgement   ADL Overall ADL's : Needs assistance/impaired     Grooming: Minimal assistance;Standing                   Toilet Transfer: Moderate assistance;RW;Ambulation;BSC           Functional mobility during ADLs: Minimal assistance;Rolling walker(from higher surface) General ADL Comments: unable to reach feet at this time due to pain and weakness; may benefit form AE     Vision       Perception     Praxis      Cognition  Arousal/Alertness: Awake/alert Behavior During Therapy: WFL for tasks assessed/performed Overall Cognitive Status: History of cognitive impairments - at baseline                                          Exercises Exercises: Other exercises Other Exercises Other Exercises: reaching out of base of support x 10 to address balance and trunk control for ADL   Shoulder Instructions       General Comments      Pertinent Vitals/ Pain       Pain Assessment: Faces Faces Pain Scale: Hurts little more Pain Location: scrotal area/foley site Pain Descriptors / Indicators: Discomfort;Sore Pain Intervention(s): Limited activity within patient's tolerance  Home Living   Living Arrangements: Alone Available Help at Discharge: Family;Available 24 hours/day(if needed can stay with his daughter ) Type of Home: House Home Access: Ramped entrance Entrance Stairs-Number of Steps: 3   Home Layout: One level     Bathroom Shower/Tub: Tub/shower unit(bathed at the sink )   Biochemist, clinical: Standard Bathroom Accessibility: Yes How Accessible: Accessible via walker        Lives With: Alone    Prior Functioning/Environment Level of Independence:  Independent with assistive device(s)            Frequency  Min 3X/week        Progress Toward Goals  OT Goals(current goals can now be found in the care plan section)  Progress towards OT goals: Progressing toward goals  Acute Rehab OT Goals Patient Stated Goal: To get stronger so he can go home OT Goal Formulation: With patient/family Time For Goal Achievement: 07/09/17 Potential to Achieve Goals: Good ADL Goals Pt Will Perform Grooming: with min guard assist;standing Pt Will Perform Upper Body Bathing: with set-up;sitting Pt Will Perform Lower Body Bathing: with min guard assist;sit to/from stand Pt Will Perform Upper Body Dressing: with set-up;sitting Pt Will Perform Lower Body Dressing: with min guard assist;sit  to/from stand Pt Will Transfer to Toilet: with min assist;ambulating Pt Will Perform Toileting - Clothing Manipulation and hygiene: with min guard assist;sit to/from stand Pt Will Perform Tub/Shower Transfer: with min assist;ambulating;3 in 1;rolling walker  Plan Discharge plan remains appropriate    Co-evaluation                 AM-PAC PT "6 Clicks" Daily Activity     Outcome Measure   Help from another person eating meals?: None Help from another person taking care of personal grooming?: A Little Help from another person toileting, which includes using toliet, bedpan, or urinal?: A Lot Help from another person bathing (including washing, rinsing, drying)?: A Lot Help from another person to put on and taking off regular upper body clothing?: A Little Help from another person to put on and taking off regular lower body clothing?: A Lot 6 Click Score: 16    End of Session Equipment Utilized During Treatment: Gait belt;Rolling walker  OT Visit Diagnosis: Unsteadiness on feet (R26.81);Muscle weakness (generalized) (M62.81);History of falling (Z91.81)   Activity Tolerance Patient tolerated treatment well   Patient Left in bed;with call bell/phone within reach;with bed alarm set   Nurse Communication Mobility status        Time: 4076-8088 OT Time Calculation (min): 19 min  Charges: OT General Charges $OT Visit: 1 Visit OT Treatments $Self Care/Home Management : 8-22 mins  [place], OT/L  [phone] 06/28/2017   Phyllis Whitefield,HILLARY 06/28/2017, 3:27 PM

## 2017-06-28 NOTE — Progress Notes (Signed)
Patient ID: [name], male   DOB: [birth year], 77 y.o.   MRN: [id number] Patient admitted to 4M10 via wheelchair, escorted by nursing staff.  Patient oriented to rehab unit and verbalized understanding of rehab policies especially falls prevention policies.  Patient appears to be in no immediate distress at this time.  Brita Romp, RN

## 2017-06-28 NOTE — Care Management Note (Signed)
Case Management Note  Patient Details  Name: [name] MRN: [id number] Date of Birth: Oct 13, 1940  Subjective/Objective:    Generalized weakness, hx CVA, ARF                Action/Plan: Discharge Planning: Confirmation received from [facility] rehab RN Coordinator. Scheduled dc to CIR today.   PCP Alroy Dust, L.DEAN   Expected Discharge Date:  06/28/17               Expected Discharge Plan:  IP Rehab Facility  In-House Referral:  Clinical Social Work  Discharge planning Services  CM Consult  Post Acute Care Choice:  IP Rehab Choice offered to:  NA  DME Arranged:  N/A DME Agency:  NA  HH Arranged:  NA HH Agency:  NA  Status of Service:  Completed, signed off  If discussed at [employer], dates discussed:    Additional Comments:  Erenest Rasher, RN 06/28/2017, 3:37 PM

## 2017-06-28 NOTE — Progress Notes (Signed)
PMR Admission Coordinator Pre-Admission Assessment  Patient: [name] is an 77 y.o., male MRN: [id number] DOB: [birth year] Height: 5\' 9"  (175.3 cm) Weight: 79.8 kg (176 lb)                                                                                                                                                  Insurance Information HMO:     PPO:      PCP:      IPA:      80/20:      OTHER:  PRIMARY: U[employer] Medicare       Policy#: [id number]      Subscriber: Self CM Name: Vevelyn Royals       Phone#: [phone]     Fax#: [phone] Pre-Cert#: [id number] given 06/28/17 by Orvan July for 7 days      Employer: Retired Benefits:  Phone #: [phone]     Name: Verified online at [email or web address]   Eff. Date: 06/22/17     Deduct: $0      Out of Pocket Max: [phone]      Life Max: N/A CIR: $430 a day, days 1-4; $0 a day, days 5+      SNF: $0 a day, days 1-20; $160 a day, days 21-62; $0 a day, days 63-100 Outpatient: PT/OT     Co-Pay: $40 per visit  Home Health: PT/OT, 100%      Co-Pay: none DME: 80%     Co-Pay: 20% Providers: In-network   SECONDARY: None      Policy#:       Subscriber:  CM Name:       Phone#:      Fax#:  Pre-Cert#:       Employer:  Benefits:  Phone #:      Name:  Eff. Date:      Deduct:       Out of Pocket Max:       Life Max:  CIR:       SNF:  Outpatient:      Co-Pay:  Home Health:      Co-Pay:  DME:      Co-Pay:   Medicaid Application Date:       Case Manager:  Disability Application Date:       Case Worker:   Emergency Contact Information        Contact Information    Name Relation Home Work [place] Daughter [phone]       Current Medical History  Patient Admitting Diagnosis: debility/rhabdomyolysis related to fall/Urosepsis  History of Present Illness: [name] Beg Clementsis a 77 y.o.right handed malewith history of hypertension, atrial fibrillation maintained on aspirin but stopped taking approximately 3 months ago due to nosebleeds,  non-Hodgkin's lymphoma status post chemotherapy radiation, diabetes mellitus, urethral stricturewith multiple UTIsstatus post multiple dilations, 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  Patient lives alone and prior to admission was independent with a rolling walker a straight point cane. He still mows his own grass, performs his own grocery shopping, and still drives. One level home with 3 steps, ramped entry.Daughter can assist as needed.Presented 01/01/2019after being found by his daughter in the bathroomunable to get up from the toilet for approximately 10 hourswith diffuse weakness. Noted stumbling gait but denied falls. He had complaints of some dysuria.MRI the brain negative for acute changes.WBC 15,200, BUN 33, creatinine 1.88. CPK elevated 12,430, Urine study showed multiple leukocytes large hemoglobin no RBCs no bacteria. Started on empiric antibiotic coverage as well as aggressive IV fluids. Bladder scan 475 mL's. Chest x-ray was negative. Renal ultrasound showed no evidence of hydronephrosis.Urine study negative nitrite and urine culture recently incubated for better growth and remains on empiric Rocephinthat has since been discontinued. Blood cultures remain negative.Urology consulted for evaluation of possible urethral stricture and difficult catheterization. A Foley catheter tube was placed await plan for voiding trial. Placed on subcutaneous Lovenox for DVT prophylaxis. CKs trending down nicely 1538-280. Physicaland occupationaltherapy evaluationscompleted 01/03/2019with recommendations of physical medicine rehabilitation consult.Patient was admitted for a comprehensive rehabilitation program 06/28/2017.     Past Medical History      Past Medical History:  Diagnosis Date  . A-fib ([place])   . 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 Cataract   . Charcot-Marie-Tooth disease    . Diabetes mellitus   . Hypertension   . 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 SIRS (systemic inflammatory response syndrome) ([place]) 09/2015  . Stroke ([place])   . Weakness     Family History  family history includes Hypertension in his father and mother.  Prior Rehab/Hospitalizations:  Has the patient had major surgery during 100 days prior to admission? No  Current Medications   Current Facility-Administered Medications:  .  acetaminophen (TYLENOL) tablet 650 mg, 650 mg, Oral, Q6H PRN **OR** acetaminophen (TYLENOL) suppository 650 mg, 650 mg, Rectal, Q6H PRN, Smith, Rondell A, MD .  albuterol (PROVENTIL) (2.5 MG/3ML) 0.083% nebulizer solution 2.5 mg, 2.5 mg, Nebulization, Q2H PRN, Smith, Rondell A, MD .  amLODipine (NORVASC) tablet 5 mg, 5 mg, Oral, Daily, Regalado, Belkys A, MD, 5 mg at 06/28/17 0945 .  aspirin EC tablet 81 mg, 81 mg, Oral, Once per day on Mon Wed Fri, Smith, Rondell A, MD, 81 mg at 06/28/17 1128 .  cefTRIAXone (ROCEPHIN) 1 g in dextrose 5 % 50 mL IVPB, 1 g, Intravenous, Q24H, Laren Everts, RPH, Stopped at 06/27/17 2216 .  clopidogrel (PLAVIX) tablet 75 mg, 75 mg, Oral, Daily, Tamala Julian, Rondell A, MD, 75 mg at 06/28/17 0954 .  enoxaparin (LOVENOX) injection 40 mg, 40 mg, Subcutaneous, Q24H, Smith, Rondell A, MD, 40 mg at 06/27/17 1248 .  hydrALAZINE (APRESOLINE) injection 10 mg, 10 mg, Intravenous, Q4H PRN, Smith, Rondell A, MD .  insulin aspart (novoLOG) injection 0-5 Units, 0-5 Units, Subcutaneous, QHS, Smith, Rondell A, MD, 2 Units at 06/27/17 2231 .  insulin aspart (novoLOG) injection 0-9 Units, 0-9 Units, Subcutaneous, TID WC, Smith, Rondell A, MD, 5 Units at 06/27/17 1724 .  insulin glargine (LANTUS) injection 12 Units, 12 Units, Subcutaneous, Daily, Norval Morton, MD, 12 Units at 06/28/17 0955 .  loperamide (IMODIUM) capsule 2 mg, 2 mg, Oral, Daily PRN, Tamala Julian, Rondell A, MD .  metoprolol tartrate  (  LOPRESSOR) tablet 25 mg, 25 mg, Oral, BID, Smith, Rondell A, MD, 25 mg at 06/28/17 0953 .  ondansetron (ZOFRAN) tablet 4 mg, 4 mg, Oral, Q6H PRN **OR** ondansetron (ZOFRAN) injection 4 mg, 4 mg, Intravenous, Q6H PRN, Smith, Rondell A, MD .  tamsulosin (FLOMAX) capsule 0.8 mg, 0.8 mg, Oral, Daily, Smith, Rondell A, MD, 0.8 mg at 06/28/17 5176  Patients Current Diet: Diet heart healthy/carb modified Room service appropriate? Yes; Fluid consistency: Thin Diet - low sodium heart healthy  Precautions / Restrictions Precautions Precautions: Fall Restrictions Weight Bearing Restrictions: No   Has the patient had 2 or more falls or a fall with injury in the past year?Yes  Prior Activity Level Limited Community (1-2x/wk): Prior to admission patient went out and drove a couple times a week.  He lived alone and was fully independent and he is eager to regain this independence.    Home Assistive Devices / Equipment Home Assistive Devices/Equipment: Environmental consultant (specify type), Kasandra Knudsen (specify quad or straight), Wheelchair Home Equipment: Walker - 2 wheels, Cane - single point, Bedside commode  Prior Device Use: Indicate devices/aids used by the patient prior to current illness, exacerbation or injury? Walker in the house and Single point cane when outside  Prior Functional Level Prior Function Level of Independence: Independent with assistive device(s) Comments: PTA pt was indep with RW/SPC, managing bills, mowing grass, grocery shopping, driving, etc.   Self Care: Did the patient need help bathing, dressing, using the toilet or eating? Independent  Indoor Mobility: Did the patient need assistance with walking from room to room (with or without device)? Independent  Stairs: Did the patient need assistance with internal or external stairs (with or without device)? Independent  Functional Cognition: Did the patient need help planning regular tasks such as shopping or remembering to take  medications? Independent  Current Functional Level Cognition  Overall Cognitive Status: History of cognitive impairments - at baseline Orientation Level: Oriented X4 General Comments: Moments of strong emotion, but quickly returns to normal.     Extremity Assessment (includes Sensation/Coordination)  Upper Extremity Assessment: Generalized weakness, Overall WFL for tasks assessed  Lower Extremity Assessment: Defer to PT evaluation RLE Deficits / Details: grossly 3/5 throughout, pain with hip flexion RLE Sensation: decreased light touch(prior history of neuropathy) LLE Deficits / Details: grossly 3+/5, pain with hip flexion LLE Sensation: decreased light touch(baseline neuropathy)    ADLs  Overall ADL's : Needs assistance/impaired Eating/Feeding: Set up, Sitting Grooming: Set up, Sitting Grooming Details (indicate cue type and reason): 1 task in unsupported sitting.  Upper Body Bathing: Moderate assistance, Sitting Lower Body Bathing: Moderate assistance, +2 for physical assistance, Sit to/from stand Upper Body Dressing : Moderate assistance, Sitting Lower Body Dressing: Moderate assistance, +2 for physical assistance, Sit to/from stand Toilet Transfer: Moderate assistance, +2 for physical assistance, Stand-pivot Toileting- Clothing Manipulation and Hygiene: Moderate assistance, +2 for physical assistance, Sit to/from stand Tub/ Shower Transfer: Moderate assistance, +2 for physical assistance, Stand-pivot, 3 in 1, Rolling walker General ADL Comments: Pt completed bed mobility, pivotal steps to recliner, stood about 2 minutes with walking in place a little bit incorporated. Pt then fatigued quickly and initiated sitting into recliner.     Mobility  Overal bed mobility: Needs Assistance Bed Mobility: Supine to Sit Rolling: Min assist Sidelying to sit: Min assist, HOB elevated Supine to sit: HOB elevated, Min guard Sit to supine: Mod assist General bed mobility comments:  Close guard as pt transitioned to EOB. Pt required significant time to scoot  out to EOB due to pain from catheter and wounds on his buttocks.     Transfers  Overall transfer level: Needs assistance Equipment used: Rolling walker (2 wheeled) Transfers: Sit to/from Stand Sit to Stand: Mod assist Stand pivot transfers: +2 physical assistance, Min assist General transfer comment: VC's for hand placement on seated surface for safety. Increased time required and assist to power-up to full stand. Uncontrolled descent noted several times throughout.     Ambulation / Gait / Stairs / Wheelchair Mobility  Ambulation/Gait Ambulation/Gait assistance: Min guard, Min assist, +2 safety/equipment Ambulation Distance (Feet): 100 Feet Assistive device: Rolling walker (2 wheeled) Gait Pattern/deviations: Step-through pattern, Decreased stride length, Trunk flexed General Gait Details: VC's for improved posture and general safety. Occasional assist required for walker management but overall pt ambulating without assistance. Chair follow utilized and pt required sudden transfer to sit as he began having a bowel movement while ambulating.  Gait velocity: Decreased Gait velocity interpretation: Below normal speed for age/gender    Posture / Balance Dynamic Sitting Balance Sitting balance - Comments: able to sit EOB for ~3 min with min guard for safety, no assist required for support. Balance Overall balance assessment: Needs assistance Sitting-balance support: Bilateral upper extremity supported, Feet supported Sitting balance-Leahy Scale: Fair Sitting balance - Comments: able to sit EOB for ~3 min with min guard for safety, no assist required for support. Standing balance support: Bilateral upper extremity supported Standing balance-Leahy Scale: Poor Standing balance comment: Reliant on UE support.     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; Blister left buttocks; Moisture associated skin break down to sacrum                   Bowel mgmt: Incontinent, last BM 06/26/17 Bladder mgmt: Foley placed 06/23/17, await voiding trials  Diabetic mgmt: Yes with diet, CBG checks, and self-administered insulin      Previous Home Environment Living Arrangements: Alone  Lives With: Alone Available Help at Discharge: Family, Available 24 hours/day(if needed can stay with his daughter ) Type of Home: House Home Layout: One level Home Access: Ramped entrance Entrance Stairs-Rails: Right Entrance Stairs-Number of Steps: 3 Bathroom Shower/Tub: Tub/shower unit(bathed at the sink ) Biochemist, clinical: Standard Bathroom Accessibility: Yes How Accessible: Accessible via walker [place]: No  Discharge Living Setting Plans for Discharge Living Setting: Other (Comment)(patient's home is 1st choice (see previous home section above for details); however, daughter's home info is below if needed) Type of Home at Discharge: House Discharge Home Layout: One level Discharge Home Access: Stairs to enter Entrance Stairs-Rails: None Entrance Stairs-Number of Steps: 4 Discharge Bathroom Shower/Tub: Tub/shower unit Discharge Bathroom Toilet: Standard Discharge Bathroom Accessibility: Yes How Accessible: Accessible via walker Does the patient have any problems obtaining your medications?: No  Social/Family/Support Systems Patient Roles: Parent Contact Information: Daughter: Thea Silversmith Anticipated Caregiver: Daughter can provide Supervision if needed Anticipated Caregiver's Contact Information: [phone] Ability/Limitations of Caregiver: Can provide Supervision assist, no physical assist Caregiver Availability: 24/7 Discharge Plan Discussed with Primary Caregiver:  Yes Is Caregiver In Agreement with Plan?: Yes Does Caregiver/Family have Issues with Lodging/Transportation while Pt is in Rehab?: No  Goals/Additional Needs Patient/Family Goal for Rehab: PT/OT:  Mod I-Supervision  Expected length of stay: 14-20 days Cultural Considerations: goes to church weekly  Dietary Needs: Heart healthy, Carb. modified diet restrictions  Equipment Needs: TBD Special Service Needs: None Pt/Family Agrees to Admission and willing to participate: Yes Program Orientation Provided & Reviewed with Pt/Caregiver Including Roles  & Responsibilities: Yes Additional Information Needs: Former patient, here after his CVA in 2010 and saw Dr. Naaman Plummer Information Needs to be Provided By: Team FYI  Decrease burden of Care through IP rehab admission: No  Possible need for SNF placement upon discharge: No  Patient Condition: This patient's medical and functional status has changed since the consult dated: 06/25/17 at 11:58 am in which the Rehabilitation Physician determined and documented that the patient's condition is appropriate for intensive rehabilitative care in an inpatient rehabilitation facility. See "History of Present Illness" (above) for medical update. Functional changes are: Mod A transfers and Min guard-Min A 100 feet. Patient's medical and functional status update has been discussed with the Rehabilitation physician and patient remains appropriate for inpatient rehabilitation. Will admit to inpatient rehab today.  Preadmission Screen Completed By:  Gunnar Fusi, 06/28/2017 2:49 PM ______________________________________________________________________   Discussed status with Dr. Naaman Plummer on 06/28/17 at 1450 and received telephone approval for admission today.  Admission Coordinator:  Gunnar Fusi, time 1450/Date 06/28/17             Cosigned by: Meredith Staggers, MD at 06/28/2017 2:56 PM  Revision History

## 2017-06-28 NOTE — Progress Notes (Signed)
CSW notes patient is admitting to CIR. CSW signing off.  Percell Locus Gurtej Noyola LCSWA [phone]

## 2017-06-28 NOTE — H&P (Signed)
Physical Medicine and Rehabilitation Admission H&P       Chief Complaint  Patient presents with  . Weakness  : HPI: [name] Edmonds Clementsis a 77 y.o.right handed malewith history of hypertension, atrial fibrillation maintained on aspirin but stopped taking approximately 3 months ago due to nosebleeds, non-Hodgkin's lymphoma status post chemotherapy radiation, diabetes mellitus, urethral stricture with multiple UTIs status post multiple dilations, 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 Per chart review patient lives alone. Independent with a rolling walker a straight point cane prior to admission. He still mows his own grass performs his own grocery shopping and still drives. One level home with 3 steps to entry.Daughter can assist as needed.Presented 06/22/2017 after being found by his daughter in the bathroomunable to get up from the toilet for approximately 10 hourswith diffuse weakness. Noted stumbling gait but denied falls. He had complaints of some dysuria.[name] MRI the brain negative for acute changes. WBC 15,200, BUN 33, creatinine 1.88. CPK elevated 12,430, Urine study showed multiple leukocytes large hemoglobin no RBCs no bacteria. Started on empiric antibiotic coverage as well as aggressive IV fluids. Bladder scan 475 mL's. Chest x-ray was negative. Renal ultrasound showed no evidence of hydronephrosis. Urine study negative nitrite and urine culture recently incubated for better growth and remains on empiric Rocephin that has since been discontinued. Blood cultures remain negative. Urology consulted for evaluation of possible urethral stricture and difficult catheterization. A Foley catheter tube was placed await plan for voiding trial. Placed on subcutaneous Lovenox for DVT prophylaxis. CKs trending down nicely 1538-280. Physical and occupational therapy evaluations completed 06/24/2017 with recommendations of  physical medicine rehabilitation consult. Patient was admitted for a comprehensive rehabilitation program    Review of Systems  Constitutional: Negative for fever.  HENT: Positive for hearing loss.   Eyes: Negative for blurred vision.  Cardiovascular: Positive for palpitations and leg swelling.  Gastrointestinal: Positive for constipation. Negative for nausea.  Genitourinary: Positive for dysuria and urgency.  Musculoskeletal: Positive for joint pain and myalgias.  Skin: Negative for rash.  Neurological: Positive for weakness. Negative for seizures.  All other systems reviewed and are negative.      Past Medical History:  Diagnosis Date  . A-fib ([place])   . 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 Cataract   . Charcot-Marie-Tooth disease   . Diabetes mellitus   . Hypertension   . 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 SIRS (systemic inflammatory response syndrome) ([place]) 09/2015  . Stroke ([place])   . Weakness         Past Surgical History:  Procedure Laterality Date  . ANKLE FUSION Left   . CATARACT EXTRACTION    . CIRCUMCISION    . TOE AMPUTATION     right foot third toe   . URETHRA SURGERY          Family History  Problem Relation Age of Onset  . Hypertension Mother   . Hypertension Father    Social History:  reports that he quit smoking about 26 years ago. He has a 70.00 pack-year smoking history. He quit smokeless tobacco use about 27 years ago. He reports that he does not drink alcohol or use drugs. Allergies:       Allergies  Allergen Reactions  . Actos [Pioglitazone Hydrochloride] Other (See Comments)    Afib  . Hydrochlorothiazide Other (See Comments)    Lower  potassium to low  . Penicillins Rash    Tolerated cefepime, ceftriaxone         Medications Prior to Admission  Medication Sig Dispense Refill  . aspirin EC 81 MG EC tablet Take 1 tablet (81 mg total)  by mouth daily. (Patient taking differently: Take 81 mg by mouth 3 (three) times a week. Monday, Wednesday and Friday) 30 tablet 0  . clopidogrel (PLAVIX) 75 MG tablet Take 75 mg by mouth daily.    . insulin glargine (LANTUS) 100 UNIT/ML injection Inject 12 Units into the skin daily.     [name] lisinopril (PRINIVIL,ZESTRIL) 10 MG tablet Take 10 mg by mouth daily.    [name] loperamide (IMODIUM A-D) 2 MG tablet Take 2 mg by mouth 4 (four) times daily as needed for diarrhea or loose stools.    . metFORMIN (GLUCOPHAGE) 500 MG tablet Take 500 mg by mouth daily with breakfast.     . metoprolol tartrate (LOPRESSOR) 25 MG tablet Take 0.5 tablets (12.5 mg total) by mouth 2 (two) times daily. (Patient taking differently: Take 25 mg by mouth 2 (two) times daily. ) 60 tablet 0  . repaglinide (PRANDIN) 0.5 MG tablet Take 0.25 mg by mouth 3 (three) times daily before meals.    . simvastatin (ZOCOR) 20 MG tablet Take 20 mg by mouth at bedtime.      . tamsulosin (FLOMAX) 0.4 MG CAPS capsule Take 2 capsules (0.8 mg total) by mouth daily. 30 capsule 0  . cephALEXin (KEFLEX) 500 MG capsule Take 1 capsule (500 mg total) by mouth 2 (two) times daily. (Patient not taking: Reported on 06/22/2017) 10 capsule 0  . saccharomyces boulardii (FLORASTOR) 250 MG capsule Take 1 capsule (250 mg total) by mouth 2 (two) times daily. (Patient not taking: Reported on 06/22/2017) 30 capsule 0    Drug Regimen Review Drug regimen was reviewed and remains appropriate with no significant issues identified  Home: Home Living Family/patient expects to be discharged to:: Private residence Living Arrangements: Alone Available Help at Discharge: Family, Available 24 hours/day(to provide supervision only) Type of Home: House Home Access: Stairs to enter, Ramped entrance Technical brewer of Steps: 3 Entrance Stairs-Rails: Right Home Layout: One level Home Equipment: Environmental consultant - 2 wheels, Cane - single point, Bedside commode     Functional History: Prior Function Level of Independence: Independent with assistive device(s) Comments: PTA pt was indep with RW/SPC, managing bills, mowing grass, grocery shopping, driving, etc.   Functional Status:  Mobility: Bed Mobility Overal bed mobility: Needs Assistance Bed Mobility: Rolling, Sidelying to Sit Rolling: Min assist Sidelying to sit: Min assist, HOB elevated Supine to sit: Max assist, HOB elevated Sit to supine: Mod assist General bed mobility comments: Pt required min A to elevate trunk. Able to progress LEs off EOB. Use of bed rails and increased time required to scoot to EOB Transfers Overall transfer level: Needs assistance Equipment used: Rolling walker (2 wheeled) Transfers: Sit to/from Stand, Stand Pivot Transfers Sit to Stand: +2 physical assistance, Mod assist Stand pivot transfers: +2 physical assistance, Min assist General transfer comment: Pt able to rise into standing with +2 assist to power up. Min A +2 to steady and take small steps to recliner chair. Ambulation/Gait General Gait Details: Pt able to take a few unsteady steps in place at recliner.   ADL:  Cognition: Cognition Overall Cognitive Status: History of cognitive impairments - at baseline Orientation Level: Oriented X4 Cognition Arousal/Alertness: Awake/alert Behavior During Therapy: WFL for tasks assessed/performed Overall Cognitive  Status: History of cognitive impairments - at baseline General Comments: Moments of strong emotion, but quickly returns to normal  Physical Exam: Blood pressure (!) 149/54, pulse 62, temperature 98.3 F (36.8 C), temperature source Oral, resp. rate 20, height '5\' 9"'$  (1.753 m), weight 79.8 kg (176 lb), SpO2 97 %. Physical Exam  Vitals reviewed. HENT:  Head: Normocephalic.  Eyes: EOM are normal. Right eye exhibits no discharge. Left eye exhibits no discharge.  Neck: Normal range of motion. Neck supple. No tracheal deviation present. No  thyromegaly present.  Cardiovascular: Regular rhythm. Exam reveals no gallop and no friction rub.  No murmur heard. Cardiac rate controlled  Respiratory: Effort normal and breath sounds normal. No respiratory distress. He has no wheezes. He has no rales.  GI: Soft. Bowel sounds are normal. He exhibits no distension.  Skin. Warm and dry Neurological: He isalertand oriented to person, place, and time.  Normal insight and awareness.  Left upper strength grossly 4+ out of 5 proximal to distal.  Right upper extremity 4 to 4+ out of 5 proximal to distal.  Right lower extremity is 2- and 2 out of 5 hip flexion 4 out of 5 knee extension and ankle dorsiflexion/plantarflexion.  Left lower extremity is 3 out of 5 hip flexion, 3+ out of 5 knee extension  and 4+ out of 5 ankle dorsiflexion and plantar flexion.no gross sensory abnormalities   psychiatric: Patient pleasant and appropriate.   LabResultsLast48Hours        Results for orders placed or performed during the hospital encounter of 06/22/17 (from the past 48 hour(s))  CK     Status: Abnormal   Collection Time: 06/23/17  1:14 PM  Result Value Ref Range   Total CK 7,663 (H) 49 - 397 U/L    Comment: RESULTS CONFIRMED BY MANUAL DILUTION  Troponin I     Status: Abnormal   Collection Time: 06/23/17  1:14 PM  Result Value Ref Range   Troponin I 0.10 (HH) <0.03 ng/mL    Comment: CRITICAL VALUE NOTED.  VALUE IS CONSISTENT WITH PREVIOUSLY REPORTED AND CALLED VALUE.  Glucose, capillary     Status: Abnormal   Collection Time: 06/23/17  5:35 PM  Result Value Ref Range   Glucose-Capillary 110 (H) 65 - 99 mg/dL  Glucose, capillary     Status: Abnormal   Collection Time: 06/23/17  9:25 PM  Result Value Ref Range   Glucose-Capillary 161 (H) 65 - 99 mg/dL  CK     Status: Abnormal   Collection Time: 06/24/17  5:05 AM  Result Value Ref Range   Total CK 3,446 (H) 49 - 397 U/L  CBC     Status: Abnormal   Collection Time: 06/24/17   5:05 AM  Result Value Ref Range   WBC 14.1 (H) 4.0 - 10.5 K/uL   RBC 4.70 4.22 - 5.81 MIL/uL   Hemoglobin 14.3 13.0 - 17.0 g/dL   HCT 42.9 39.0 - 52.0 %   MCV 91.3 78.0 - 100.0 fL   MCH 30.4 26.0 - 34.0 pg   MCHC 33.3 30.0 - 36.0 g/dL   RDW 14.0 11.5 - 15.5 %   Platelets 107 (L) 150 - 400 K/uL    Comment: PLATELET COUNT CONFIRMED BY SMEAR  Comprehensive metabolic panel     Status: Abnormal   Collection Time: 06/24/17  5:05 AM  Result Value Ref Range   Sodium 140 135 - 145 mmol/L   Potassium 4.1 3.5 - 5.1 mmol/L   Chloride 112 (  H) 101 - 111 mmol/L   CO2 23 22 - 32 mmol/L   Glucose, Bld 147 (H) 65 - 99 mg/dL   BUN 24 (H) 6 - 20 mg/dL   Creatinine, Ser 0.94 0.61 - 1.24 mg/dL   Calcium 7.7 (L) 8.9 - 10.3 mg/dL   Total Protein 4.8 (L) 6.5 - 8.1 g/dL   Albumin 2.3 (L) 3.5 - 5.0 g/dL   AST 113 (H) 15 - 41 U/L   ALT 54 17 - 63 U/L   Alkaline Phosphatase 45 38 - 126 U/L   Total Bilirubin 0.7 0.3 - 1.2 mg/dL   GFR calc non Af Amer >60 >60 mL/min   GFR calc Af Amer >60 >60 mL/min    Comment: (NOTE) The eGFR has been calculated using the CKD EPI equation. This calculation has not been validated in all clinical situations. eGFR's persistently <60 mL/min signify possible Chronic Kidney Disease.    Anion gap 5 5 - 15  Glucose, capillary     Status: Abnormal   Collection Time: 06/24/17  7:51 AM  Result Value Ref Range   Glucose-Capillary 113 (H) 65 - 99 mg/dL  Glucose, capillary     Status: Abnormal   Collection Time: 06/24/17 11:53 AM  Result Value Ref Range   Glucose-Capillary 137 (H) 65 - 99 mg/dL  Glucose, capillary     Status: Abnormal   Collection Time: 06/24/17  4:44 PM  Result Value Ref Range   Glucose-Capillary 143 (H) 65 - 99 mg/dL  Glucose, capillary     Status: Abnormal   Collection Time: 06/24/17  9:39 PM  Result Value Ref Range   Glucose-Capillary 123 (H) 65 - 99 mg/dL  CK     Status: Abnormal   Collection Time: 06/25/17   3:25 AM  Result Value Ref Range   Total CK 1,538 (H) 49 - 397 U/L  Glucose, capillary     Status: Abnormal   Collection Time: 06/25/17  8:00 AM  Result Value Ref Range   Glucose-Capillary 105 (H) 65 - 99 mg/dL     ImagingResults(Last48hours)  No results found.       Medical Problem List and Plan: 1.  Debility secondary to rhabdomyolysis related to fall/urosepsis as well as history of CVA with right-sided residual weakness.              -Admit to inpatient rehab              -total CKs trending down 2.  DVT Prophylaxis/Anticoagulation: Subcutaneous Lovenox. Monitor platelet counts in any signs of bleeding 3. Pain Management: Tylenol as needed 4. Mood: Provide emotional support 5. Neuropsych: This patient is capable of making decisions on his own behalf. 6. Skin/Wound Care: Routine skin checks 7. Fluids/Electrolytes/Nutrition: Routine I&O's with follow-up chemistries 8. Hypertension. Norvasc 5 mg daily, Lopressor 25 mg twice a day. Monitor with increased mobility 9. History of urethral stricture multiple dilations. Follow-up urology services.  Rhabdomyolysis continue Flomax 0.8 mg daily.  I reviewed the fact with the patient that I would like to remove Foley in the morning and begin voiding trial 10. PAF. Cardiac rate controlled. Continue aspirin and Plavix 11. Diabetes mellitus with peripheral neuropathy. Hemoglobin A1c 7.0. Lantus insulin 12 units daily. Check blood sugars before meals and at bedtime. Diabetic teaching   Post Admission Physician Evaluation: 1. Functional deficits secondary  to in the setting of chronic right-sided weakness after CVA. 2. Patient is admitted to receive collaborative, interdisciplinary care between the physiatrist, rehab nursing staff,  and therapy team. 3. Patient's level of medical complexity and substantial therapy needs in context of that medical necessity cannot be provided at a lesser intensity of care such as a SNF. 4. Patient has  experienced substantial functional loss from his/her baseline which was documented above under the "Functional History" and "Functional Status" headings.  Judging by the patient's diagnosis, physical exam, and functional history, the patient has potential for functional progress which will result in measurable gains while on inpatient rehab.  These gains will be of substantial and practical use upon discharge  in facilitating mobility and self-care at the household level. 5. Physiatrist will provide 24 hour management of medical needs as well as oversight of the therapy plan/treatment and provide guidance as appropriate regarding the interaction of the two. 6. The Preadmission Screening has been reviewed and patient status is unchanged unless otherwise stated above. 7. 24 hour rehab nursing will assist with bladder management, bowel management, safety, skin/wound care, disease management, medication administration, pain management and patient education  and help integrate therapy concepts, techniques,education, etc. 8. PT will assess and treat for/with: Lower extremity strength, range of motion, stamina, balance, functional mobility, safety, adaptive techniques and equipment, pain management, neuromuscular education.   Goals are: Modified independent to supervision. 9. OT will assess and treat for/with: ADL's, functional mobility, safety, upper extremity strength, adaptive techniques and equipment, neuromuscular reeducation, pain control, ego support.   Goals are: Modified independent to supervision. Therapy may proceed with showering this patient. 10. SLP will assess and treat for/with: n/a.  Goals are: n/a. 11. Case Management and Social Worker will assess and treat for psychological issues and discharge planning. 12. Team conference will be held weekly to assess progress toward goals and to determine barriers to discharge. 13. Patient will receive at least 3 hours of therapy per day at least 5 days per  week. 14. ELOS: 14-18vdays       15. Prognosis:  excellent     Meredith Staggers, MD, [facility] 06/28/2017  Lavon Paganini [place], PA-C 06/25/2017

## 2017-06-28 NOTE — Care Management Important Message (Signed)
Important Message  Patient Details  Name: [name] MRN: [id number] Date of Birth: February 07, 1941   Medicare Important Message Given:  Yes    Nathen May 06/28/2017, 9:36 AM

## 2017-06-28 NOTE — Discharge Summary (Signed)
Physician Discharge Summary  LEM PEARY BTD:[id number] DOB: 1940-12-12 DOA: 06/22/2017  PCP: Alroy Dust, L.Marlou Sa, MD  Admit date: 06/22/2017 Discharge date: 06/28/2017  Admitted From: Home  Disposition: CIR  Recommendations for Outpatient Follow-up:  1. Follow up with PCP in 1-2 weeks 2. Please obtain BMP/CBC in one week 3. Need voiding trial.  4. Needs ceftriaxone for 2 more days, hopefully can have voiding trial prior stopping antibiotics.     Discharge Condition: stable.  CODE STATUS; full code.  Diet recommendation: Heart Healthy   Brief/Interim Summary:  Brief Narrative:  [name] Clementsis [name] 77 y.o.malewith medical history significant of HTN, afib, NHL s/p chemo/rad, DM type II, history of CVA with right hemiparesis, urological issues including urethral stricture s/p dilations; who presents with complaints of weakness unable to get off the bathroom toilet of his home since approximately 500. He was found by his daughter around 1500. Prior to onset of symptoms patient had been in his normal state of health and had watched the [employer] celebrations on his television at home. At baseline patient is able to complete all of his ADLs and lives alone.   High risk for CVA but appears this episode of weakness may be due to UTI.  Suspect d/c in 1-2 days. mRI pending   Assessment & Plan:   Generalized weakness -Likely due to dehydration and UTI.  -Continue antibiotics for UTI will transition to oral antibiotics 1-04   Acute renal failure, ureteral stricture, urinary retention: Acute.  -Patient with previous history of ureteral stricture requiring dilations.  -ultrasound without evidence of hydronephrosis. -Continue Foley catheter, urology consult appreciated -needs voiding trial.  Renal function improved  with IV fluids.  Per urology:  -Voiding  trial ;  - Recommend patient perform self calibration at home with 11fr catheter, once [name] day after foley is removed  -  patient should follow up with Dr. Jeffie Pollock 2-4 weeks after discharge   -BPH, continue Flomax -Follow-up with urology Dr.Wrenn  Hypokalemia; replete orally.  Replete mg. IV     Possible urinary tract infection -Continue ceftriaxone, follow-up urine culture Aerococcus.  -Blood cultures negative - renal U/S w/o blockage day 6 IV antibiotics. Still has foley catheter  needs voiding trial in 24 hours. Discontinue IV ceftriaxone in 2 days/   Rhabdomyolysis: Acute -improving with hydration -CK was significantly elevated at 12,000 on admission. -related to immobilization.  -resume statins, monitor CK intermittent.   Hypertensive urgency -continue metoprolol - Hydralazine IV prn -improved with Norvasc.  Avoid ace due to AKI.   History of CVA with right hemiparesis -Reports worsening of right leg weakness in the recent week -MRI negative for acute stroke.  - Continue aspirin and Plavix  Diabetes mellitus type 2 :  - hemoglobin A1c Is 7 - Hold metformin and repaglinide  - Continue Lantus    Mild elevation of troponin -clinically no evidence of ACS, EKG nonacute and no symptoms -likely due to demand from tolerated hypertension.  Paroxysmal atrial fibrillation:  - on dual antiplatelet therapy.   Thrombocytopenia: Chronic. Platelet count 122 on admission - stable, monitor  Hyperlipidemia - resume statins. Monitor CK level.   BPH - Continue Flomax  History of non-Hodgkin's lymphoma:  -Patient reports being status post remote radiation and chemotherapy.     Discharge Diagnoses:  Principal Problem:   Rhabdomyolysis Active Problems:   [name]-fib (HCC)   Hypertension   Thrombocytopenia (HCC)   Weakness   PAF (paroxysmal atrial fibrillation) (HCC)   Diabetes mellitus type 2,  uncontrolled ([place])   Personal history of urethral stricture    Discharge Instructions  Discharge Instructions    Diet - low sodium heart healthy   Complete by:  As directed     Increase activity slowly   Complete by:  As directed      Allergies as of 06/28/2017      Reactions   Actos [pioglitazone Hydrochloride] Other (See Comments)   Afib   Hydrochlorothiazide Other (See Comments)   Lower potassium to low   Penicillins Rash   Tolerated cefepime, ceftriaxone      Medication List    STOP taking these medications   cephALEXin 500 MG capsule Commonly known as:  KEFLEX   lisinopril 10 MG tablet Commonly known as:  PRINIVIL,ZESTRIL   metFORMIN 500 MG tablet Commonly known as:  GLUCOPHAGE   repaglinide 0.5 MG tablet Commonly known as:  PRANDIN     TAKE these medications   amLODipine 5 MG tablet Commonly known as:  NORVASC Take 1 tablet (5 mg total) by mouth daily. Start taking on:  06/29/2017   aspirin 81 MG EC tablet Take 1 tablet (81 mg total) by mouth daily. What changed:    when to take this  additional instructions   cefTRIAXone 1 g in dextrose 5 % 50 mL Inject 1 g into the vein daily.   clopidogrel 75 MG tablet Commonly known as:  PLAVIX Take 75 mg by mouth daily.   insulin glargine 100 UNIT/ML injection Commonly known as:  LANTUS Inject 12 Units into the skin daily.   loperamide 2 MG tablet Commonly known as:  IMODIUM [name]-D Take 2 mg by mouth 4 (four) times daily as needed for diarrhea or loose stools.   metoprolol tartrate 25 MG tablet Commonly known as:  LOPRESSOR Take 1 tablet (25 mg total) by mouth 2 (two) times daily.   saccharomyces boulardii 250 MG capsule Commonly known as:  FLORASTOR Take 1 capsule (250 mg total) by mouth 2 (two) times daily.   simvastatin 20 MG tablet Commonly known as:  ZOCOR Take 20 mg by mouth at bedtime.   tamsulosin 0.4 MG Caps capsule Commonly known as:  FLOMAX Take 2 capsules (0.8 mg total) by mouth daily.       Allergies  Allergen Reactions  . Actos [Pioglitazone Hydrochloride] Other (See Comments)    Afib  . Hydrochlorothiazide Other (See Comments)    Lower potassium to low  .  Penicillins Rash    Tolerated cefepime, ceftriaxone    Consultations:  Urology    Procedures/Studies: Mr Brain Wo Contrast  Result Date: 06/25/2017 CLINICAL DATA:  Non-Hodgkin's lymphoma. History of stroke with right-sided weakness. Worsening weakness beginning today. EXAM: MRI HEAD WITHOUT CONTRAST TECHNIQUE: Multiplanar, multiecho pulse sequences of the brain and surrounding structures were obtained without intravenous contrast. COMPARISON:  CT 09/23/2015.  MRI 08/15/2015. FINDINGS: Brain: Diffusion imaging does not show any acute or subacute infarction. Chronic small-vessel ischemic changes are seen throughout the pons. There are old cerebellar infarctions. Cerebral hemispheres show generalized atrophy with moderate chronic small-vessel ischemic changes affecting the deep and subcortical white matter. No large vessel territory infarction. Focus of hemosiderin deposition in the left posterior parietal white matter consistent with an old micro hemorrhagic insult. No sign of acute hemorrhage. No mass lesion, hydrocephalus or extra-axial collection. Vascular: Major vessels at the base of the brain show flow. Skull and upper cervical spine: Negative Sinuses/Orbits: Clear/normal Other: None IMPRESSION: No acute or reversible finding. Generalized atrophy. Extensive chronic ischemic changes  throughout the brain as outlined above. Similar appearance to the study of 2017, findings mildly progressive. Electronically Signed   By: Nelson Chimes M.D.   On: 06/25/2017 15:15   US Renal  Result Date: 06/23/2017 CLINICAL DATA:  Acute onset of renal failure. EXAM: RENAL / URINARY TRACT ULTRASOUND COMPLETE COMPARISON:  PET/CT performed 12/10/2009 FINDINGS: Right Kidney: Length: 10.6 cm. Echogenicity within normal limits. [name] 2.2 cm cyst is noted at the upper pole of the right kidney. No hydronephrosis visualized. Left Kidney: Length: 12.0 cm. Echogenicity within normal limits. Minimal left renal cortical thinning is  noted. No mass or hydronephrosis visualized. Bladder: Decompressed, with [name] Foley catheter in place. Stones are seen dependently within the gallbladder. IMPRESSION: 1. No evidence of hydronephrosis. 2. Minimal left renal cortical thinning may reflect chronic renal disease. 3. Right renal cyst noted. 4. Cholelithiasis noted. Gallbladder otherwise grossly unremarkable in appearance. Electronically Signed   By: Garald Balding M.D.   On: 06/23/2017 02:38   Dg Chest Port 1 View  Result Date: 06/23/2017 CLINICAL DATA:  Patient is too weak to stand up. EXAM: PORTABLE CHEST 1 VIEW COMPARISON:  09/23/2015 FINDINGS: Shallow inspiration. Heart size and pulmonary vascularity are normal. Linear scarring in the left mid lung is unchanged since prior study. No airspace disease or consolidation. No blunting of costophrenic angles. No pneumothorax. Calcification of the aorta. IMPRESSION: No evidence of active pulmonary disease.  Aortic atherosclerosis. Electronically Signed   By: Lucienne Capers M.D.   On: 06/23/2017 01:54    Subjective: Feeling stronger, not at baseline.    Discharge Exam: Vitals:   06/28/17 0945 06/28/17 1404  BP: (!) 109/54 (!) 148/42  Pulse:  68  Resp:  (!) 24  Temp:  98.8 F (37.1 C)  SpO2:  97%   Vitals:   06/27/17 2231 06/28/17 0542 06/28/17 0945 06/28/17 1404  BP:  (!) 162/57 (!) 109/54 (!) 148/42  Pulse: 79 (!) 58  68  Resp:  18  (!) 24  Temp:  98.1 F (36.7 C)  98.8 F (37.1 C)  TempSrc:  Oral    SpO2:  97%  97%  Weight:      Height:        General: Pt is alert, awake, not in acute distress Cardiovascular: RRR, S1/S2 +, no rubs, no gallops Respiratory: CTA bilaterally, no wheezing, no rhonchi Abdominal: Soft, NT, ND, bowel sounds + Extremities: no edema, no cyanosis    The results of significant diagnostics from this hospitalization (including imaging, microbiology, ancillary and laboratory) are listed below for reference.     Microbiology: Recent Results (from  the past 240 hour(s))  Urine culture     Status: Abnormal   Collection Time: 06/22/17 10:54 PM  Result Value Ref Range Status   Specimen Description URINE, CATHETERIZED  Final   Special Requests NONE  Final   Culture ([name])  Final    >=100,000 COLONIES/mL AEROCOCCUS SPECIES Standardized susceptibility testing for this organism is not available.    Report Status 06/28/2017 FINAL  Final  Blood culture (routine x 2)     Status: None   Collection Time: 06/22/17 11:50 PM  Result Value Ref Range Status   Specimen Description BLOOD RIGHT HAND  Final   Special Requests IN PEDIATRIC BOTTLE Blood Culture adequate volume  Final   Culture NO GROWTH 5 DAYS  Final   Report Status 06/28/2017 FINAL  Final  Blood culture (routine x 2)     Status: None   Collection Time: 06/22/17  11:54 PM  Result Value Ref Range Status   Specimen Description BLOOD RIGHT HAND  Final   Special Requests IN PEDIATRIC BOTTLE Blood Culture adequate volume  Final   Culture NO GROWTH 5 DAYS  Final   Report Status 06/28/2017 FINAL  Final     Labs: BNP (last 3 results) No results for input(s): BNP in the last 8760 hours. Basic Metabolic Panel: Recent Labs  Lab 06/23/17 0325 06/24/17 0505 06/26/17 0237 06/27/17 0230 06/28/17 0344  NA 136 140 137 136 137  K 4.4 4.1 3.1* 3.3* 3.6  CL 108 112* 105 108 105  CO2 19* 23 24 25 26   GLUCOSE 199* 147* 171* 141* 116*  BUN 39* 24* 19 15 15   CREATININE 1.43* 0.94 0.74 0.66 0.62  CALCIUM 7.5* 7.7* 7.2* 7.5* 7.8*  MG  --   --   --  1.5* 1.8   Liver Function Tests: Recent Labs  Lab 06/23/17 0325 06/24/17 0505  AST 205* 113*  ALT 58 54  ALKPHOS 50 45  BILITOT 0.7 0.7  PROT 5.2* 4.8*  ALBUMIN 2.7* 2.3*   No results for input(s): LIPASE, AMYLASE in the last 168 hours. No results for input(s): AMMONIA in the last 168 hours. CBC: Recent Labs  Lab 06/22/17 1740 06/23/17 0325 06/24/17 0505 06/26/17 0237  WBC 15.2* 13.3* 14.1* 9.2  NEUTROABS 12.9*  --   --   --   HGB  17.5* 14.4 14.3 12.6*  HCT 50.0 41.2 42.9 38.3*  MCV 89.8 89.4 91.3 88.7  PLT 122* 127* 107* 117*   Cardiac Enzymes: Recent Labs  Lab 06/23/17 0330 06/23/17 0656 06/23/17 1314 06/24/17 0505 06/25/17 0325 06/26/17 0237 06/27/17 0230  CKTOTAL  --   --  7,663* 3,446* 1,538* 631* 280  TROPONINI 0.15* 0.13* 0.10*  --   --   --   --    BNP: Invalid input(s): POCBNP CBG: Recent Labs  Lab 06/27/17 1210 06/27/17 1700 06/27/17 2209 06/28/17 0804 06/28/17 1208  GLUCAP 180* 263* 161* 104* 198*   D-Dimer No results for input(s): DDIMER in the last 72 hours. Hgb A1c No results for input(s): HGBA1C in the last 72 hours. Lipid Profile No results for input(s): CHOL, HDL, LDLCALC, TRIG, CHOLHDL, LDLDIRECT in the last 72 hours. Thyroid function studies No results for input(s): TSH, T4TOTAL, T3FREE, THYROIDAB in the last 72 hours.  Invalid input(s): FREET3 Anemia work up No results for input(s): VITAMINB12, FOLATE, FERRITIN, TIBC, IRON, RETICCTPCT in the last 72 hours. Urinalysis    Component Value Date/Time   COLORURINE YELLOW 06/22/2017 2207   APPEARANCEUR HAZY ([name]) 06/22/2017 2207   LABSPEC 1.011 06/22/2017 2207   PHURINE 5.0 06/22/2017 2207   GLUCOSEU 50 ([name]) 06/22/2017 2207   HGBUR LARGE ([name]) 06/22/2017 2207   BILIRUBINUR NEGATIVE 06/22/2017 2207   KETONESUR 5 ([name]) 06/22/2017 2207   PROTEINUR 100 ([name]) 06/22/2017 2207   UROBILINOGEN 0.2 05/29/2014 2125   NITRITE NEGATIVE 06/22/2017 2207   LEUKOCYTESUR MODERATE ([name]) 06/22/2017 2207   Sepsis Labs Invalid input(s): PROCALCITONIN,  WBC,  LACTICIDVEN Microbiology Recent Results (from the past 240 hour(s))  Urine culture     Status: Abnormal   Collection Time: 06/22/17 10:54 PM  Result Value Ref Range Status   Specimen Description URINE, CATHETERIZED  Final   Special Requests NONE  Final   Culture ([name])  Final    >=100,000 COLONIES/mL AEROCOCCUS SPECIES Standardized susceptibility testing for this organism is not available.     Report Status 06/28/2017 FINAL  Final  Blood culture (routine x 2)     Status: None   Collection Time: 06/22/17 11:50 PM  Result Value Ref Range Status   Specimen Description BLOOD RIGHT HAND  Final   Special Requests IN PEDIATRIC BOTTLE Blood Culture adequate volume  Final   Culture NO GROWTH 5 DAYS  Final   Report Status 06/28/2017 FINAL  Final  Blood culture (routine x 2)     Status: None   Collection Time: 06/22/17 11:54 PM  Result Value Ref Range Status   Specimen Description BLOOD RIGHT HAND  Final   Special Requests IN PEDIATRIC BOTTLE Blood Culture adequate volume  Final   Culture NO GROWTH 5 DAYS  Final   Report Status 06/28/2017 FINAL  Final     Time coordinating discharge: Over 30 minutes  SIGNED:   Elmarie Shiley, MD  [facility] 06/28/2017, 2:41 PM Pager   If 7PM-7AM, please contact night-coverage [email or web address] Password [email or web address]

## 2017-06-28 NOTE — Progress Notes (Signed)
Physical Therapy Treatment Patient Details Name: [name] MRN: [id number] DOB: 09-Feb-1941 Today's Date: 06/28/2017    History of Present Illness Pt is a 77 y/o male admitted with profound weakness of sudden onset, workup pending (UTI (-)).  PMH HTN, Afib, NHL s/p chemo/radiation, DM, CVA with R hemiparesis    PT Comments    Pt progressing towards physical therapy goals. Required occasional assist for balance support and walker management throughout gait training, however pt's main limitations this session appeared to be scooting to EOB/edge of chair, and controlling descent to chair with stand>sit. Pt anticipates d/c to CIR hopefully today. Will continue to follow and progress as able per POC.    Follow Up Recommendations  CIR;Supervision/Assistance - 24 hour     Equipment Recommendations  Other (comment)(tbd in next venue)    Recommendations for Other Services Rehab consult;OT consult     Precautions / Restrictions Precautions Precautions: Fall Restrictions Weight Bearing Restrictions: No    Mobility  Bed Mobility Overal bed mobility: Needs Assistance Bed Mobility: Supine to Sit     Supine to sit: HOB elevated;Min guard     General bed mobility comments: Close guard as pt transitioned to EOB. Pt required significant time to scoot out to EOB due to pain from catheter and wounds on his buttocks.   Transfers Overall transfer level: Needs assistance Equipment used: Rolling walker (2 wheeled) Transfers: Sit to/from Stand Sit to Stand: Mod assist         General transfer comment: VC's for hand placement on seated surface for safety. Increased time required and assist to power-up to full stand. Uncontrolled descent noted several times throughout.   Ambulation/Gait Ambulation/Gait assistance: Min guard;Min assist;+2 safety/equipment Ambulation Distance (Feet): 100 Feet Assistive device: Rolling walker (2 wheeled) Gait Pattern/deviations: Step-through  pattern;Decreased stride length;Trunk flexed Gait velocity: Decreased Gait velocity interpretation: Below normal speed for age/gender General Gait Details: VC's for improved posture and general safety. Occasional assist required for walker management but overall pt ambulating without assistance. Chair follow utilized and pt required sudden transfer to sit as he began having a bowel movement while ambulating.    Stairs            Wheelchair Mobility    Modified Rankin (Stroke Patients Only)       Balance Overall balance assessment: Needs assistance Sitting-balance support: Bilateral upper extremity supported;Feet supported Sitting balance-Leahy Scale: Fair     Standing balance support: Bilateral upper extremity supported Standing balance-Leahy Scale: Poor Standing balance comment: Reliant on UE support.                             Cognition Arousal/Alertness: Awake/alert Behavior During Therapy: WFL for tasks assessed/performed Overall Cognitive Status: History of cognitive impairments - at baseline                                        Exercises      General Comments        Pertinent Vitals/Pain Pain Assessment: Faces Faces Pain Scale: No hurt    Home Living                      Prior Function            PT Goals (current goals can now be found in the care plan section) Acute Rehab  PT Goals Patient Stated Goal: To get stronger so he can go home PT Goal Formulation: With patient Time For Goal Achievement: 07/08/17 Potential to Achieve Goals: Good Progress towards PT goals: Progressing toward goals    Frequency    Min 3X/week      PT Plan Current plan remains appropriate    Co-evaluation              AM-PAC PT "6 Clicks" Daily Activity  Outcome Measure  Difficulty turning over in bed (including adjusting bedclothes, sheets and blankets)?: Unable Difficulty moving from lying on back to sitting on  the side of the bed? : Unable Difficulty sitting down on and standing up from a chair with arms (e.g., wheelchair, bedside commode, etc,.)?: Unable Help needed moving to and from a bed to chair (including a wheelchair)?: A Lot Help needed walking in hospital room?: A Lot Help needed climbing 3-5 steps with a railing? : Total 6 Click Score: 8    End of Session Equipment Utilized During Treatment: Gait belt Activity Tolerance: Patient tolerated treatment well Patient left: with call bell/phone within reach;with family/visitor present;in chair;with chair alarm set Nurse Communication: Mobility status PT Visit Diagnosis: Muscle weakness (generalized) (M62.81);History of falling (Z91.81);Difficulty in walking, not elsewhere classified (R26.2)     Time: 8264-1583 PT Time Calculation (min) (ACUTE ONLY): 35 min  Charges:  $Gait Training: 23-37 mins                    G Codes:       Rolinda Roan, PT, DPT Acute Rehabilitation Services Pager: [place] 06/28/2017, 10:14 AM

## 2017-06-28 NOTE — PMR Pre-admission (Signed)
PMR Admission Coordinator Pre-Admission Assessment  Patient: [name] is an 77 y.o., male MRN: [id number] DOB: 07/31/1940 Height: 5\' 9"  (175.3 cm) Weight: 79.8 kg (176 lb)              Insurance Information HMO:     PPO:      PCP:      IPA:      80/20:      OTHER:  PRIMARY: U[employer] Medicare       Policy#: [id number]      Subscriber: Self CM Name: Vevelyn Royals       Phone#: [phone]     Fax#: [phone] Pre-Cert#: [id number] given 06/28/17 by Orvan July for 7 days      Employer: Retired Benefits:  Phone #: [phone]     Name: Verified online at [email or web address]  Eff. Date: 06/22/17     Deduct: $0      Out of Pocket Max: [phone]      Life Max: N/A CIR: $430 a day, days 1-4; $0 a day, days 5+      SNF: $0 a day, days 1-20; $160 a day, days 21-62; $0 a day, days 63-100 Outpatient: PT/OT     Co-Pay: $40 per visit  Home Health: PT/OT, 100%      Co-Pay: none DME: 80%     Co-Pay: 20% Providers: In-network   SECONDARY: None      Policy#:       Subscriber:  CM Name:       Phone#:      Fax#:  Pre-Cert#:       Employer:  Benefits:  Phone #:      Name:  Eff. Date:      Deduct:       Out of Pocket Max:       Life Max:  CIR:       SNF:  Outpatient:      Co-Pay:  Home Health:      Co-Pay:  DME:      Co-Pay:   Medicaid Application Date:       Case Manager:  Disability Application Date:       Case Worker:   Emergency Contact Information Contact Information    Name Relation Home Work Leroy Daughter [phone]       Current Medical History  Patient Admitting Diagnosis: debility/rhabdomyolysis related to fall/Urosepsis  History of Present Illness: [name] Storlie Clementsis a 77 y.o.right handed malewith history of hypertension, atrial fibrillation maintained on aspirin but stopped taking approximately 3 months ago due to nosebleeds, non-Hodgkin's lymphoma status post chemotherapy radiation, diabetes mellitus, urethral stricture with multiple UTIs status post multiple dilations,  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  Patient lives alone and prior to admission was independent with a rolling walker a straight point cane. He still mows his own grass, performs his own grocery shopping, and still drives. One level home with 3 steps, ramped entry.Daughter can assist as needed.Presented 06/22/2017 after being found by his daughter in the bathroomunable to get up from the toilet for approximately 10 hourswith diffuse weakness. Noted stumbling gait but denied falls. He had complaints of some dysuria. MRI the brain negative for acute changes. WBC 15,200, BUN 33, creatinine 1.88. CPK elevated 12,430, Urine study showed multiple leukocytes large hemoglobin no RBCs no bacteria. Started on empiric antibiotic coverage as well as aggressive IV fluids. Bladder scan 475 mL's. Chest x-ray was negative. Renal ultrasound showed no evidence of  hydronephrosis. Urine study negative nitrite and urine culture recently incubated for better growth and remains on empiric Rocephin that has since been discontinued. Blood cultures remain negative. Urology consulted for evaluation of possible urethral stricture and difficult catheterization. A Foley catheter tube was placed await plan for voiding trial. Placed on subcutaneous Lovenox for DVT prophylaxis. CKs trending down nicely 1538-280. Physical and occupational therapy evaluations completed 06/24/2017 with recommendations of physical medicine rehabilitation consult. Patient was admitted for a comprehensive rehabilitation program 06/28/2017.         Past Medical History  Past Medical History:  Diagnosis Date  . A-fib ([place])   . 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 Cataract   . Charcot-Marie-Tooth disease   . Diabetes mellitus   . Hypertension   . 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 SIRS (systemic  inflammatory response syndrome) ([place]) 09/2015  . Stroke ([place])   . Weakness     Family History  family history includes Hypertension in his father and mother.  Prior Rehab/Hospitalizations:  Has the patient had major surgery during 100 days prior to admission? No  Current Medications   Current Facility-Administered Medications:  .  acetaminophen (TYLENOL) tablet 650 mg, 650 mg, Oral, Q6H PRN **OR** acetaminophen (TYLENOL) suppository 650 mg, 650 mg, Rectal, Q6H PRN, Smith, Rondell A, MD .  albuterol (PROVENTIL) (2.5 MG/3ML) 0.083% nebulizer solution 2.5 mg, 2.5 mg, Nebulization, Q2H PRN, Smith, Rondell A, MD .  amLODipine (NORVASC) tablet 5 mg, 5 mg, Oral, Daily, Regalado, Belkys A, MD, 5 mg at 06/28/17 0945 .  aspirin EC tablet 81 mg, 81 mg, Oral, Once per day on Mon Wed Fri, Smith, Rondell A, MD, 81 mg at 06/28/17 1128 .  cefTRIAXone (ROCEPHIN) 1 g in dextrose 5 % 50 mL IVPB, 1 g, Intravenous, Q24H, Laren Everts, RPH, Stopped at 06/27/17 2216 .  clopidogrel (PLAVIX) tablet 75 mg, 75 mg, Oral, Daily, Tamala Julian, Rondell A, MD, 75 mg at 06/28/17 0954 .  enoxaparin (LOVENOX) injection 40 mg, 40 mg, Subcutaneous, Q24H, Smith, Rondell A, MD, 40 mg at 06/27/17 1248 .  hydrALAZINE (APRESOLINE) injection 10 mg, 10 mg, Intravenous, Q4H PRN, Smith, Rondell A, MD .  insulin aspart (novoLOG) injection 0-5 Units, 0-5 Units, Subcutaneous, QHS, Smith, Rondell A, MD, 2 Units at 06/27/17 2231 .  insulin aspart (novoLOG) injection 0-9 Units, 0-9 Units, Subcutaneous, TID WC, Smith, Rondell A, MD, 5 Units at 06/27/17 1724 .  insulin glargine (LANTUS) injection 12 Units, 12 Units, Subcutaneous, Daily, Norval Morton, MD, 12 Units at 06/28/17 0955 .  loperamide (IMODIUM) capsule 2 mg, 2 mg, Oral, Daily PRN, Tamala Julian, Rondell A, MD .  metoprolol tartrate (LOPRESSOR) tablet 25 mg, 25 mg, Oral, BID, Smith, Rondell A, MD, 25 mg at 06/28/17 0953 .  ondansetron (ZOFRAN) tablet 4 mg, 4 mg, Oral, Q6H PRN **OR**  ondansetron (ZOFRAN) injection 4 mg, 4 mg, Intravenous, Q6H PRN, Smith, Rondell A, MD .  tamsulosin (FLOMAX) capsule 0.8 mg, 0.8 mg, Oral, Daily, Smith, Rondell A, MD, 0.8 mg at 06/28/17 2703  Patients Current Diet: Diet heart healthy/carb modified Room service appropriate? Yes; Fluid consistency: Thin Diet - low sodium heart healthy  Precautions / Restrictions Precautions Precautions: Fall Restrictions Weight Bearing Restrictions: No   Has the patient had 2 or more falls or a fall with injury in the past year?Yes  Prior Activity Level Limited Community (1-2x/wk): Prior to admission patient  went out and drove a couple times a week.  He lived alone and was fully independent and he is eager to regain this independence.    Home Assistive Devices / Equipment Home Assistive Devices/Equipment: Environmental consultant (specify type), Kasandra Knudsen (specify quad or straight), Wheelchair Home Equipment: Walker - 2 wheels, Cane - single point, Bedside commode  Prior Device Use: Indicate devices/aids used by the patient prior to current illness, exacerbation or injury? Walker in the house and Single point cane when outside  Prior Functional Level Prior Function Level of Independence: Independent with assistive device(s) Comments: PTA pt was indep with RW/SPC, managing bills, mowing grass, grocery shopping, driving, etc.   Self Care: Did the patient need help bathing, dressing, using the toilet or eating? Independent  Indoor Mobility: Did the patient need assistance with walking from room to room (with or without device)? Independent  Stairs: Did the patient need assistance with internal or external stairs (with or without device)? Independent  Functional Cognition: Did the patient need help planning regular tasks such as shopping or remembering to take medications? Independent  Current Functional Level Cognition  Overall Cognitive Status: History of cognitive impairments - at baseline Orientation Level: Oriented  X4 General Comments: Moments of strong emotion, but quickly returns to normal.     Extremity Assessment (includes Sensation/Coordination)  Upper Extremity Assessment: Generalized weakness, Overall WFL for tasks assessed  Lower Extremity Assessment: Defer to PT evaluation RLE Deficits / Details: grossly 3/5 throughout, pain with hip flexion RLE Sensation: decreased light touch(prior history of neuropathy) LLE Deficits / Details: grossly 3+/5, pain with hip flexion LLE Sensation: decreased light touch(baseline neuropathy)    ADLs  Overall ADL's : Needs assistance/impaired Eating/Feeding: Set up, Sitting Grooming: Set up, Sitting Grooming Details (indicate cue type and reason): 1 task in unsupported sitting.  Upper Body Bathing: Moderate assistance, Sitting Lower Body Bathing: Moderate assistance, +2 for physical assistance, Sit to/from stand Upper Body Dressing : Moderate assistance, Sitting Lower Body Dressing: Moderate assistance, +2 for physical assistance, Sit to/from stand Toilet Transfer: Moderate assistance, +2 for physical assistance, Stand-pivot Toileting- Clothing Manipulation and Hygiene: Moderate assistance, +2 for physical assistance, Sit to/from stand Tub/ Shower Transfer: Moderate assistance, +2 for physical assistance, Stand-pivot, 3 in 1, Rolling walker General ADL Comments: Pt completed bed mobility, pivotal steps to recliner, stood about 2 minutes with walking in place a little bit incorporated. Pt then fatigued quickly and initiated sitting into recliner.     Mobility  Overal bed mobility: Needs Assistance Bed Mobility: Supine to Sit Rolling: Min assist Sidelying to sit: Min assist, HOB elevated Supine to sit: HOB elevated, Min guard Sit to supine: Mod assist General bed mobility comments: Close guard as pt transitioned to EOB. Pt required significant time to scoot out to EOB due to pain from catheter and wounds on his buttocks.     Transfers  Overall transfer  level: Needs assistance Equipment used: Rolling walker (2 wheeled) Transfers: Sit to/from Stand Sit to Stand: Mod assist Stand pivot transfers: +2 physical assistance, Min assist General transfer comment: VC's for hand placement on seated surface for safety. Increased time required and assist to power-up to full stand. Uncontrolled descent noted several times throughout.     Ambulation / Gait / Stairs / Wheelchair Mobility  Ambulation/Gait Ambulation/Gait assistance: Min guard, Min assist, +2 safety/equipment Ambulation Distance (Feet): 100 Feet Assistive device: Rolling walker (2 wheeled) Gait Pattern/deviations: Step-through pattern, Decreased stride length, Trunk flexed General Gait Details: VC's for improved posture and general safety.  Occasional assist required for walker management but overall pt ambulating without assistance. Chair follow utilized and pt required sudden transfer to sit as he began having a bowel movement while ambulating.  Gait velocity: Decreased Gait velocity interpretation: Below normal speed for age/gender    Posture / Balance Dynamic Sitting Balance Sitting balance - Comments: able to sit EOB for ~3 min with min guard for safety, no assist required for support. Balance Overall balance assessment: Needs assistance Sitting-balance support: Bilateral upper extremity supported, Feet supported Sitting balance-Leahy Scale: Fair Sitting balance - Comments: able to sit EOB for ~3 min with min guard for safety, no assist required for support. Standing balance support: Bilateral upper extremity supported Standing balance-Leahy Scale: Poor Standing balance comment: Reliant on UE support.     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; Blister  left buttocks; Moisture associated skin break down to sacrum                   Bowel mgmt: Incontinent, last BM 06/26/17 Bladder mgmt: Foley placed 06/23/17, await voiding trials  Diabetic mgmt: Yes with diet, CBG checks, and self-administered insulin      Previous Home Environment Living Arrangements: Alone  Lives With: Alone Available Help at Discharge: Family, Available 24 hours/day(if needed can stay with his daughter ) Type of Home: House Home Layout: One level Home Access: Ramped entrance Entrance Stairs-Rails: Right Entrance Stairs-Number of Steps: 3 Bathroom Shower/Tub: Tub/shower unit(bathed at the sink ) Biochemist, clinical: Standard Bathroom Accessibility: Yes How Accessible: Accessible via walker [place]: No  Discharge Living Setting Plans for Discharge Living Setting: Other (Comment)(patient's home is 1st choice (see previous home section above for details); however, daughter's home info is below if needed) Type of Home at Discharge: House Discharge Home Layout: One level Discharge Home Access: Stairs to enter Entrance Stairs-Rails: None Entrance Stairs-Number of Steps: 4 Discharge Bathroom Shower/Tub: Tub/shower unit Discharge Bathroom Toilet: Standard Discharge Bathroom Accessibility: Yes How Accessible: Accessible via walker Does the patient have any problems obtaining your medications?: No  Social/Family/Support Systems Patient Roles: Parent Contact Information: Daughter: Thea Silversmith Anticipated Caregiver: Daughter can provide Supervision if needed Anticipated Caregiver's Contact Information: [phone] Ability/Limitations of Caregiver: Can provide Supervision assist, no physical assist Caregiver Availability: 24/7 Discharge Plan Discussed with Primary Caregiver: Yes Is Caregiver In Agreement with Plan?: Yes Does Caregiver/Family have Issues with Lodging/Transportation while Pt is in Rehab?: No  Goals/Additional Needs Patient/Family Goal for  Rehab: PT/OT: Mod I-Supervision  Expected length of stay: 14-20 days Cultural Considerations: goes to church weekly  Dietary Needs: Heart healthy, Carb. modified diet restrictions  Equipment Needs: TBD Special Service Needs: None Pt/Family Agrees to Admission and willing to participate: Yes Program Orientation Provided & Reviewed with Pt/Caregiver Including Roles  & Responsibilities: Yes Additional Information Needs: Former patient, here after his CVA in 2010 and saw Dr. Naaman Plummer Information Needs to be Provided By: Team FYI  Decrease burden of Care through IP rehab admission: No  Possible need for SNF placement upon discharge: No  Patient Condition: This patient's medical and functional status has changed since the consult dated: 06/25/17 at 11:58 am in which the Rehabilitation Physician determined and documented that the patient's condition  is appropriate for intensive rehabilitative care in an inpatient rehabilitation facility. See "History of Present Illness" (above) for medical update. Functional changes are: Mod A transfers and Min guard-Min A 100 feet. Patient's medical and functional status update has been discussed with the Rehabilitation physician and patient remains appropriate for inpatient rehabilitation. Will admit to inpatient rehab today.  Preadmission Screen Completed By:  Gunnar Fusi, 06/28/2017 2:49 PM ______________________________________________________________________   Discussed status with Dr. Naaman Plummer on 06/28/17 at 1450 and received telephone approval for admission today.  Admission Coordinator:  Gunnar Fusi, time 1450/Date 06/28/17

## 2017-06-28 NOTE — Progress Notes (Signed)
Physical Medicine and Rehabilitation Consult Reason for Consult: Decreased functional mobility Referring Physician: [facility]   HPI: [name] is a 77 y.o. 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 Per chart review patient lives alone. Independent with a rolling walker a straight point cane prior to admission. He still mows his own grass performs his own grocery shopping and still drives. One level home with 3 steps to entry. Daughter can assist as needed. Presented 06/22/2016 after being found by his daughter in the bathroom unable to get up from the toilet for approximately 10 hours with diffuse weakness. Noted stumbling gait but denied falls. He had complaints of some dysuria.. WBC 15,200, BUN 33, creatinine 1.88. CPK elevated 12,430, Urine study showed multiple leukocytes large hemoglobin no RBCs no bacteria. Started on empiric antibiotic coverage as well as aggressive IV fluids. Bladder scan 475 mL's. Chest x-ray was negative. Renal ultrasound showed no evidence of hydronephrosis. Urology consulted for evaluation of possible urethral stricture and difficult catheterization. A Foley catheter tube was placed await plan for voiding trial. Placed on subcutaneous Lovenox for DVT prophylaxis. CKs trending down nicely 1538. Physical therapy evaluation completed 06/24/2016 with recommendations of physical medicine rehabilitation consult.   Review of Systems  Constitutional: Negative for chills and fever.  HENT: Positive for hearing loss.   Eyes: Negative for blurred vision and double vision.  Respiratory: Negative for cough and shortness of breath.    Cardiovascular: Positive for palpitations and leg swelling.  Gastrointestinal: Positive for constipation. Negative for nausea.  Genitourinary: Positive for dysuria and urgency.  Musculoskeletal: Positive for joint pain and myalgias.  Skin: Negative for rash.  Neurological: Positive for weakness. Negative for seizures.  All other systems reviewed and are negative.      Past Medical History:  Diagnosis Date  . A-fib ([place])   . 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 Cataract   . Charcot-Marie-Tooth disease   . Diabetes mellitus   . Hypertension   . 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 SIRS (systemic inflammatory response syndrome) ([place]) 09/2015  . Stroke ([place])   . Weakness         Past Surgical History:  Procedure Laterality Date  . ANKLE FUSION Left   . CATARACT EXTRACTION    . CIRCUMCISION    . TOE AMPUTATION     right foot third toe   . URETHRA SURGERY          Family History  Problem Relation Age of Onset  . Hypertension Mother   . Hypertension Father    Social History:  reports that he quit smoking about 26 years ago. He has a 70.00 pack-year smoking history. He quit smokeless tobacco use about 27 years ago. He reports that he does not drink alcohol or use drugs. Allergies:       Allergies  Allergen Reactions  . Actos [Pioglitazone Hydrochloride] Other (See Comments)    Afib  . Hydrochlorothiazide Other (See Comments)    Lower potassium to low  . Penicillins Rash    Tolerated cefepime, ceftriaxone         Medications Prior to Admission  Medication Sig Dispense Refill  .  aspirin EC 81 MG EC tablet Take 1 tablet (81 mg total) by mouth daily. (Patient taking differently: Take 81 mg by mouth 3 (three) times a week. Monday, Wednesday and Friday) 30 tablet 0  . clopidogrel (PLAVIX) 75 MG tablet Take 75 mg by mouth daily.    . insulin glargine (LANTUS) 100 UNIT/ML  injection Inject 12 Units into the skin daily.     [name] lisinopril (PRINIVIL,ZESTRIL) 10 MG tablet Take 10 mg by mouth daily.    [name] loperamide (IMODIUM A-D) 2 MG tablet Take 2 mg by mouth 4 (four) times daily as needed for diarrhea or loose stools.    . metFORMIN (GLUCOPHAGE) 500 MG tablet Take 500 mg by mouth daily with breakfast.     . metoprolol tartrate (LOPRESSOR) 25 MG tablet Take 0.5 tablets (12.5 mg total) by mouth 2 (two) times daily. (Patient taking differently: Take 25 mg by mouth 2 (two) times daily. ) 60 tablet 0  . repaglinide (PRANDIN) 0.5 MG tablet Take 0.25 mg by mouth 3 (three) times daily before meals.    . simvastatin (ZOCOR) 20 MG tablet Take 20 mg by mouth at bedtime.      . tamsulosin (FLOMAX) 0.4 MG CAPS capsule Take 2 capsules (0.8 mg total) by mouth daily. 30 capsule 0  . cephALEXin (KEFLEX) 500 MG capsule Take 1 capsule (500 mg total) by mouth 2 (two) times daily. (Patient not taking: Reported on 06/22/2017) 10 capsule 0  . saccharomyces boulardii (FLORASTOR) 250 MG capsule Take 1 capsule (250 mg total) by mouth 2 (two) times daily. (Patient not taking: Reported on 06/22/2017) 30 capsule 0    Home: Home Living Family/patient expects to be discharged to:: Private residence Living Arrangements: Alone Available Help at Discharge: Family, Available 24 hours/day(to provide supervision only) Type of Home: House Home Access: Stairs to enter, Ramped entrance Technical brewer of Steps: 3 Entrance Stairs-Rails: Right Home Layout: One level Home Equipment: Environmental consultant - 2 wheels, Cane - single point, Bedside commode  Functional History: Prior Function Level of Independence: Independent with assistive device(s) Comments: PTA pt was indep with RW/SPC, managing bills, mowing grass, grocery shopping, driving, etc.  Functional Status:  Mobility: Bed Mobility Overal bed mobility: Needs Assistance Bed Mobility: Rolling, Supine to Sit, Sit to Supine Rolling:  Supervision Supine to sit: Max assist, HOB elevated Sit to supine: Mod assist General bed mobility comments: pt requiring heavy max assist to bring trunk into upright position for supine>sit despite HOB elevated and bedrails.   Transfers Overall transfer level: Needs assistance Equipment used: Rolling walker (2 wheeled) Transfers: Sit to/from Stand Sit to Stand: +2 physical assistance, Total assist General transfer comment: pt unable to come to full standing at RW despite +2 total assist.  Decreased forward weight shift 2/2 pain and fear of falling, profound LE weakness unable to power up from seated position.  Ambulation/Gait General Gait Details: unsafe during this session  ADL:  Cognition: Cognition Overall Cognitive Status: Within Functional Limits for tasks assessed Cognition Arousal/Alertness: Awake/alert Behavior During Therapy: WFL for tasks assessed/performed Overall Cognitive Status: Within Functional Limits for tasks assessed General Comments: cognitive function appears WFL, but will need to further assessment during length of stay  Blood pressure (!) 149/54, pulse 62, temperature 98.3 F (36.8 C), temperature source Oral, resp. rate 20, height 5\' 9"  (1.753 m), weight 79.8 kg (176 lb), SpO2 97 %. Physical Exam  Vitals reviewed. Constitutional: He is oriented to person, place, and time.  HENT:  Head: Normocephalic.  Eyes:  EOM are normal.  Neck: Normal range of motion. Neck supple. No thyromegaly present.  Cardiovascular:  Cardiac rate controlled  Respiratory: Effort normal and breath sounds normal. No respiratory distress.  GI: Soft. Bowel sounds are normal. He exhibits no distension.  Genitourinary:  Genitourinary Comments: Foley tube in place  Neurological: He is alert and oriented to person, place, and time.   LUE 4+/5. RUE 4/5. RLE: 2-HF, KE and 4- ADF/PF. LLE: 3/5 HF, KE and 4+ ADF/PF.   Skin: Skin is warm and dry.  Assessment/Plan: Diagnosis:  debility/rhabdomyolysis related to fall/Urosepsis 1. Does the need for close, 24 hr/day medical supervision in concert with the patient's rehab needs make it unreasonable for this patient to be served in a less intensive setting? Yes 2. Co-Morbidities requiring supervision/potential complications: DM2, HTn, PAF,  3. Due to bladder management, bowel management, safety, skin/wound care, disease management, medication administration, pain management and patient education, does the patient require 24 hr/day rehab nursing? Yes 4. Does the patient require coordinated care of a physician, rehab nurse, PT (1-2 hrs/day, 5 days/week) and OT (1-2 hrs/day, 5 days/week) to address physical and functional deficits in the context of the above medical diagnosis(es)? Yes Addressing deficits in the following areas: balance, endurance, locomotion, strength, transferring, bowel/bladder control, bathing, dressing, feeding, grooming, toileting and psychosocial support 5. Can the patient actively participate in an intensive therapy program of at least 3 hrs of therapy per day at least 5 days per week? Yes 6. The potential for patient to make measurable gains while on inpatient rehab is excellent 7. Anticipated functional outcomes upon discharge from inpatient rehab are modified independent to supervision with PT, modified independent and supervision with OT, n/a with SLP. 8. Estimated rehab length of stay to reach the above functional goals is: 14-20 days 9. Anticipated D/C setting: Home 10. Anticipated post D/C treatments: HH therapy and Outpatient therapy 11. Overall Rehab/Functional Prognosis: excellent  RECOMMENDATIONS: This patient's condition is appropriate for continued rehabilitative care in the following setting: CIR Patient has agreed to participate in recommended program. Yes Note that insurance prior authorization may be required for reimbursement for recommended care.  Comment: Rehab Admissions  Coordinator to follow up.  Thanks,  Meredith Staggers, MD, Mellody Drown    Lavon Paganini Angiulli, PA-C 06/25/2017          Revision History                        Routing History

## 2017-06-28 NOTE — Progress Notes (Addendum)
Inpatient Rehabilitation  I have reviewed insurance verification letter and patient is in favor of IP Rehab.  I also received insurance authorization and medical clearance.  Will proceed with admission to IP Rehab today.  Discussed with team; call if questions.   Carmelia Roller., CCC/SLP Admission Coordinator  [place]  Cell [phone]

## 2017-06-29 ENCOUNTER — Inpatient Hospital Stay (HOSPITAL_COMMUNITY): Payer: Medicare Other | Admitting: Physical Therapy

## 2017-06-29 ENCOUNTER — Inpatient Hospital Stay (HOSPITAL_COMMUNITY): Payer: Medicare Other | Admitting: Occupational Therapy

## 2017-06-29 ENCOUNTER — Other Ambulatory Visit: Payer: Self-pay

## 2017-06-29 DIAGNOSIS — E1165 Type 2 diabetes mellitus with hyperglycemia: Secondary | ICD-10-CM

## 2017-06-29 DIAGNOSIS — I10 Essential (primary) hypertension: Secondary | ICD-10-CM

## 2017-06-29 DIAGNOSIS — Z87448 Personal history of other diseases of urinary system: Secondary | ICD-10-CM

## 2017-06-29 DIAGNOSIS — E1142 Type 2 diabetes mellitus with diabetic polyneuropathy: Secondary | ICD-10-CM

## 2017-06-29 DIAGNOSIS — D62 Acute posthemorrhagic anemia: Secondary | ICD-10-CM

## 2017-06-29 LAB — CBC WITH DIFFERENTIAL/PLATELET
BASOS PCT: 0 %
Basophils Absolute: 0 10*3/uL (ref 0.0–0.1)
EOS ABS: 0.7 10*3/uL (ref 0.0–0.7)
EOS PCT: 5 %
HCT: 37.3 % — ABNORMAL LOW (ref 39.0–52.0)
Hemoglobin: 12.5 g/dL — ABNORMAL LOW (ref 13.0–17.0)
Lymphocytes Relative: 8 %
Lymphs Abs: 1.1 10*3/uL (ref 0.7–4.0)
MCH: 29.6 pg (ref 26.0–34.0)
MCHC: 33.5 g/dL (ref 30.0–36.0)
MCV: 88.4 fL (ref 78.0–100.0)
MONO ABS: 1.7 10*3/uL — AB (ref 0.1–1.0)
MONOS PCT: 13 %
NEUTROS PCT: 74 %
Neutro Abs: 9.6 10*3/uL — ABNORMAL HIGH (ref 1.7–7.7)
PLATELETS: 196 10*3/uL (ref 150–400)
RBC: 4.22 MIL/uL (ref 4.22–5.81)
RDW: 13.2 % (ref 11.5–15.5)
WBC: 13.1 10*3/uL — ABNORMAL HIGH (ref 4.0–10.5)

## 2017-06-29 LAB — GLUCOSE, CAPILLARY
GLUCOSE-CAPILLARY: 143 mg/dL — AB (ref 65–99)
GLUCOSE-CAPILLARY: 200 mg/dL — AB (ref 65–99)
GLUCOSE-CAPILLARY: 219 mg/dL — AB (ref 65–99)
Glucose-Capillary: 94 mg/dL (ref 65–99)

## 2017-06-29 LAB — COMPREHENSIVE METABOLIC PANEL
ALBUMIN: 2 g/dL — AB (ref 3.5–5.0)
ALT: 39 U/L (ref 17–63)
ANION GAP: 7 (ref 5–15)
AST: 28 U/L (ref 15–41)
Alkaline Phosphatase: 44 U/L (ref 38–126)
BUN: 17 mg/dL (ref 6–20)
CO2: 26 mmol/L (ref 22–32)
Calcium: 7.8 mg/dL — ABNORMAL LOW (ref 8.9–10.3)
Chloride: 103 mmol/L (ref 101–111)
Creatinine, Ser: 0.72 mg/dL (ref 0.61–1.24)
GFR calc Af Amer: >60 mL/min (ref >60)
GFR calc non Af Amer: >60 mL/min (ref >60)
GLUCOSE: 202 mg/dL — AB (ref 65–99)
POTASSIUM: 3.6 mmol/L (ref 3.5–5.1)
SODIUM: 136 mmol/L (ref 135–145)
TOTAL PROTEIN: 4.6 g/dL — AB (ref 6.5–8.1)
Total Bilirubin: 1 mg/dL (ref 0.3–1.2)

## 2017-06-29 MED ORDER — COLLAGENASE 250 UNIT/GM EX OINT
TOPICAL_OINTMENT | Freq: Every day | CUTANEOUS | Status: DC
  Administered 2017-06-29 – 2017-07-02 (×4): via TOPICAL
  Filled 2017-06-29: qty 30

## 2017-06-29 NOTE — Evaluation (Signed)
Occupational Therapy Assessment and Plan  Patient Details  Name: [name] MRN: [id number] Date of Birth: 06/05/1941  OT Diagnosis: muscle weakness (generalized) Rehab Potential: Rehab Potential (ACUTE ONLY): Good ELOS: 10-14 days   Today's Date: 06/29/2017 OT Individual Time: 6387-5643 OT Individual Time Calculation (min): 61 min     Problem List:  Patient Active Problem List   Diagnosis Date Noted  . Benign essential HTN   . H/O urethral stricture   . Acute blood loss anemia   . Poorly controlled type 2 diabetes mellitus with peripheral neuropathy ([place])   . Debility 06/28/2017  . Personal history of urethral stricture 06/23/2017  . Rhabdomyolysis 06/23/2017  . Fever 09/23/2015  . SIRS (systemic inflammatory response syndrome) ([place]) 09/23/2015  . Near syncope 09/23/2015  . PAF (paroxysmal atrial fibrillation) ([place]) 09/23/2015  . Diabetes mellitus type 2, uncontrolled ([place]) 09/23/2015  . 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 Vertebrobasilar artery syndrome   . Orthostatic hypotension   . Insulin dependent diabetes mellitus ([place])   . Falls frequently   . Weakness 08/15/2015  . Cellulitis of right upper arm 06/02/2014  . Blood poisoning   . Encephalopathy 05/29/2014  . Sepsis ([place]) 05/29/2014  . UTI (lower urinary tract infection) 09/16/2013  . Recurrent falls 09/16/2013  . History of CVA (cerebrovascular accident) 09/16/2013  . Leukocytosis 09/16/2013  . Thrombocytopenia ([place]) 09/16/2013  . 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 Diabetes mellitus ([place]) 10/09/2011  . Hypertension 10/09/2011  . A-fib [facility])     Past Medical History:  Past Medical History:  Diagnosis Date  . A-fib ([place])   . 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 Cataract   . Charcot-Marie-Tooth disease   . Diabetes mellitus   . Hypertension   . 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 SIRS (systemic inflammatory response syndrome) ([place])  09/2015  . Stroke ([place])   . Weakness    Past Surgical History:  Past Surgical History:  Procedure Laterality Date  . ANKLE FUSION Left   . CATARACT EXTRACTION    . CIRCUMCISION    . TOE AMPUTATION     right foot third toe   . URETHRA SURGERY      Assessment & Plan Clinical Impression: Patient is a 77 y.o. year old male with recent admission to the hospital on 01/01/2019after being found by his daughter in the bathroomunable to get up from the toilet for approximately 10 hourswith diffuse weakness. Noted stumbling gait but denied falls. He had complaints of some dysuria.Marland KitchenMRI the brain negative for acute changes.WBC 15,200, BUN 33, creatinine 1.88. CPK elevated 12,430, Urine study showed multiple leukocytes large hemoglobin no RBCs no bacteria. Started on empiric antibiotic coverage as well as aggressive IV fluids. Bladder scan 475 mL's. Chest x-ray was negative. Renal ultrasound showed no evidence of hydronephrosis.Urine study negative nitrite and urine culture recently incubated for better growth and remains on empiric Rocephinthat has since been discontinued. Blood cultures remain negative.Urology consulted for evaluation of possible urethral stricture and difficult catheterization.  Patient transferred to CIR on 06/28/2017 .    Patient currently requires mod with basic self-care skills secondary to muscle weakness and decreased standing balance and decreased balance strategies.  Prior to hospitalization, patient could complete ADLs with modified independent .  Patient will benefit from skilled intervention to decrease level of assist with basic self-care skills and increase independence with basic self-care skills prior to  discharge home with care partner.  Anticipate patient will require 24 hour supervision and follow up home health.  OT - End of Session Activity Tolerance: Tolerates 30+ min activity with multiple rests Endurance Deficit: Yes OT Assessment Rehab Potential (ACUTE  ONLY): Good OT Barriers to Discharge: Decreased caregiver support OT Barriers to Discharge Comments: Pt lived alone PTA, will need to go to his daughters house for supervison at discharge.   OT Patient demonstrates impairments in the following area(s): Balance;Endurance OT Basic ADL's Functional Problem(s): Grooming;Bathing;Dressing;Toileting OT Advanced ADL's Functional Problem(s): Simple Meal Preparation;Laundry;Light Housekeeping OT Transfers Functional Problem(s): Toilet OT Plan OT Intensity: Minimum of 1-2 x/day, 45 to 90 minutes OT Frequency: 5 out of 7 days OT Duration/Estimated Length of Stay: 10-14 days OT Treatment/Interventions: Teacher, English as a foreign language;Discharge planning;Self Care/advanced ADL retraining;Therapeutic Activities;UE/LE Coordination activities;Patient/family education;Therapeutic Exercise;UE/LE Strength taining/ROM;Functional mobility training OT Self Feeding Anticipated Outcome(s): independent OT Basic Self-Care Anticipated Outcome(s): supervision OT Toileting Anticipated Outcome(s): supervision OT Bathroom Transfers Anticipated Outcome(s): supervision OT Recommendation Patient destination: Home Follow Up Recommendations: Home health OT;24 hour supervision/assistance Equipment Recommended: None recommended by OT   Skilled Therapeutic Intervention Pt completed selfcare retraining sit to stand at the sink during session.  At home he reports sponge bathing all the time and states he is familiar with a tub bench but is not interesting in using one.  He demonstrated decreased forward trunk flexion for sit to stand and stand to sit transitions during selfcare tasks.  Mod assist needed to complete sit to stand from the bed and from the wheelchair, with mod demonstrational cueing for technique.  Once standing he was able to maintain with min assist while completing peri washing.  He needed assistance with donning brief and button up pants  this session secondary to catheter.  Feel he will likely be able to complete LB dressing at min assist level once this is discharged.  Pt left at bedside in wheelchair with call button and phone in reach.    OT Evaluation Precautions/Restrictions  Precautions Precautions: Fall Restrictions Weight Bearing Restrictions: No  Pain   No report of pain when asked  Home Living/Prior [place] expects to be discharged to:: Private residence Living Arrangements: Alone Available Help at Discharge: Family, Available 24 hours/day Type of Home: House Home Access: Ramped entrance Entrance Stairs-Number of Steps: 3 Entrance Stairs-Rails: Right Home Layout: One level Bathroom Shower/Tub: Optometrist: Yes  Lives With: Alone IADL History Homemaking Responsibilities: Yes Meal Prep Responsibility: Primary Laundry Responsibility: Primary Shopping Responsibility: Primary Current License: Yes Mode of Transportation: (SUV) Occupation: Retired Prior Function Level of Independence: Independent with transfers, Independent with basic ADLs  Able to Take Stairs?: Yes Driving: Yes Vocation: Retired Comments: PTA pt was indep with RW/SPC, managing bills, mowing grass, grocery shopping, driving, etc.  ADL  See Function Section of chart for details  Vision Baseline Vision/History: Wears glasses Wears Glasses: Reading only Patient Visual Report: No change from baseline Vision Assessment?: No apparent visual deficits Perception  Perception: Within Functional Limits Praxis Praxis: Intact Cognition Overall Cognitive Status: Within Functional Limits for tasks assessed Arousal/Alertness: Awake/alert Year: 2019 Month: January Day of Week: Correct Memory: Appears intact Immediate Memory Recall: Sock;Blue;Bed Memory Recall: Sock;Blue;Bed Memory Recall Sock: Without Cue Memory Recall Blue: With Cue Memory Recall Bed:  Without Cue Attention: Sustained Sustained Attention: Appears intact Awareness: Appears intact Problem Solving: Appears intact Safety/Judgment: Appears intact Sensation Sensation Light Touch: Appears Intact(Sensation intact in BUEs) Stereognosis: Not tested  Hot/Cold: Not tested Proprioception: Not tested Coordination Gross Motor Movements are Fluid and Coordinated: No Fine Motor Movements are Fluid and Coordinated: No Coordination and Movement Description: Pt with slower movements in the RUE secondary to previous CVA but uses functionally at a diminshed level during selfcare tasks.   Motor  Motor Motor: Hemiplegia;Other (comment) Motor - Skilled Clinical Observations: Mild R hemi, generalized weakness Mobility  Bed Mobility Bed Mobility: Rolling Right;Rolling Left;Supine to Sit;Sit to Supine Rolling Right: 5: Supervision Rolling Right Details: Verbal cues for technique Rolling Left: 5: Supervision Rolling Left Details: Verbal cues for technique Supine to Sit: 4: Min assist Supine to Sit Details: Verbal cues for technique;Manual facilitation for weight shifting Sit to Supine: 4: Min assist Sit to Supine - Details: Verbal cues for technique;Manual facilitation for weight shifting Transfers Transfers: Sit to Stand Sit to Stand: With upper extremity assist;From bed;3: Mod assist Sit to Stand Details: Verbal cues for technique;Tactile cues for weight beaing;Tactile cues for initiation;Tactile cues for placement Stand to Sit: 3: Mod assist;To bed;With upper extremity assist  Trunk/Postural Assessment  Cervical Assessment Cervical Assessment: Exceptions to WFL(forward cervical flexion at rest) Thoracic Assessment Thoracic Assessment: Within Functional Limits Lumbar Assessment Lumbar Assessment: Exceptions to WFL(posterior pelvic tilt at rest) Postural Control Postural Control: Within Functional Limits  Balance Balance Balance Assessed: Yes Static Sitting Balance Static  Sitting - Balance Support: No upper extremity supported;Feet supported Static Sitting - Level of Assistance: 5: Stand by assistance Dynamic Sitting Balance Dynamic Sitting - Balance Support: No upper extremity supported;Feet supported;During functional activity Dynamic Sitting - Level of Assistance: 5: Stand by assistance Static Standing Balance Static Standing - Balance Support: During functional activity Static Standing - Level of Assistance: 4: Min assist Dynamic Standing Balance Dynamic Standing - Balance Support: During functional activity;Bilateral upper extremity supported Dynamic Standing - Level of Assistance: 4: Min assist Extremity/Trunk Assessment RUE Assessment RUE Assessment: Exceptions to [facility] for all joints with strength 4/5 throughout.  Pt with history of mild RUE hemiparesis, so coordination and movement is slightly less than normal.  Noted 5th digit flexor contracture at the IP and DIP.  ) LUE Assessment LUE Assessment: Within Functional Limits(Strength 3+-4 /5 throughout)   See Function Navigator for Current Functional Status.   Refer to Care Plan for Long Term Goals  Recommendations for other services: None    Discharge Criteria: Patient will be discharged from OT if patient refuses treatment 3 consecutive times without medical reason, if treatment goals not met, if there is a change in medical status, if patient makes no progress towards goals or if patient is discharged from hospital.  The above assessment, treatment plan, treatment alternatives and goals were discussed and mutually agreed upon: by patient  Temeca Somma OTR/L 06/29/2017, 5:12 PM

## 2017-06-29 NOTE — Progress Notes (Signed)
Social Work Assessment and Plan Social Work Assessment and Plan  Patient Details  Name: [name] MRN: [id number] Date of Birth: January 19, 1941  Today's Date: 06/29/2017  Problem List:  Patient Active Problem List   Diagnosis Date Noted  . Benign essential HTN   . H/O urethral stricture   . Acute blood loss anemia   . Poorly controlled type 2 diabetes mellitus with peripheral neuropathy ([place])   . Debility 06/28/2017  . Personal history of urethral stricture 06/23/2017  . Rhabdomyolysis 06/23/2017  . Fever 09/23/2015  . SIRS (systemic inflammatory response syndrome) ([place]) 09/23/2015  . Near syncope 09/23/2015  . PAF (paroxysmal atrial fibrillation) ([place]) 09/23/2015  . Diabetes mellitus type 2, uncontrolled ([place]) 09/23/2015  . Stenosis of artery ([place])   . Vertebrobasilar artery syndrome   . Orthostatic hypotension   . Insulin dependent diabetes mellitus ([place])   . Falls frequently   . Weakness 08/15/2015  . Cellulitis of right upper arm 06/02/2014  . Blood poisoning   . Encephalopathy 05/29/2014  . Sepsis ([place]) 05/29/2014  . UTI (lower urinary tract infection) 09/16/2013  . Recurrent falls 09/16/2013  . History of CVA (cerebrovascular accident) 09/16/2013  . Leukocytosis 09/16/2013  . Thrombocytopenia ([place]) 09/16/2013  . 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 Diabetes mellitus ([place]) 10/09/2011  . Hypertension 10/09/2011  . A-fib [facility])    Past Medical History:  Past Medical History:  Diagnosis Date  . A-fib ([place])   . 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 Cataract   . Charcot-Marie-Tooth disease   . Diabetes mellitus   . Hypertension   . 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 SIRS (systemic inflammatory response syndrome) ([place]) 09/2015  . Stroke ([place])   . Weakness    Past Surgical History:  Past Surgical History:  Procedure Laterality Date  . ANKLE FUSION Left   . CATARACT EXTRACTION    .  CIRCUMCISION    . TOE AMPUTATION     right foot third toe   . URETHRA SURGERY     Social History:  reports that he quit smoking about 26 years ago. He has a 70.00 pack-year smoking history. He quit smokeless tobacco use about 27 years ago. He reports that he does not drink alcohol or use drugs.  Family / Support Systems Marital Status: Widow/Widower How Long?: 1 1/2 years Patient Roles: Parent Children: Geophysicist/field seismologist [phone] Other Supports: Friends Anticipated Caregiver: Daughter is involved and supportive and can only provide supervision level due to health issues Ability/Limitations of Caregiver: Daughter's health issues-needs back surgery soon Caregiver Availability: 24/7 Family Dynamics: Close knit family just the three of them-pt, daughter and son in-law who are supportive of one another and will do what they can for them. Pt has been one who has stayed active and done what he could for himself and others. He still mows his yard to stay active.  Social History Preferred language: English Religion: Non-Denominational Cultural Background: No issues Education: High School Read: Yes Write: Yes Employment Status: Retired Freight forwarder Issues: No issues Guardian/Conservator: None-according to MD pt is capable of making his own decisions while here. Daughter plans to be here often and know what is going on with him   Abuse/Neglect Abuse/Neglect Assessment Can Be Completed: Yes Physical Abuse: Denies Verbal Abuse: Denies Sexual Abuse: Denies Exploitation of patient/patient's resources: Denies Self-Neglect: Denies  Emotional Status Pt's affect, behavior adn adjustment status:  Pt is motivated to do well here and will do his best. He has been here before 8 years ago after his stroke. He wants to get mod/i and be able to live alone and do for himself. His daughter is willing to assist and have him at her home, but will await his progress here. Recent  Psychosocial Issues: other health issues but was managing at home alone Pyschiatric History: No history deferred depression screening due to doing well and able to verbalize his feelings and concerns. May benefit from seeing neuro-psych while here due to all of his health issues and still grieving the loss of his wife. Substance Abuse History: No issues  Patient / Family Perceptions, Expectations & Goals Pt/Family understanding of illness & functional limitations: Pt and daughter can explain his condition and medical issues. Both talk with the MD's and feelt heir questions have been answered and know the plan forward. Pt hopes to get stronger here and mobile that is his goal for CIR. 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, retiree, church member, home owner, etc Anticipated changes in roles/activities/participation: resume Pt/family expectations/goals: Pt states: " I want to be able to go home to my house and be independent again."  Daughter states: " He can come to my home and I can be there for him we will wait and see."  [employer]: Other (Comment)(had in the past) Premorbid Home Care/DME Agencies: Other (Comment)(has RW, BSC, Cane) Transportation available at discharge: Daughter pt drove prior to admission Resource referrals recommended: Neuropsychology, Support group (specify)  Discharge Planning Living Arrangements: Alone Support Systems: Children, Immunologist, Friends/neighbors Type of Residence: Private residence Insurance Resources: Multimedia programmer (specify)(UHC-Medicare) Financial Resources: Radio broadcast assistant Screen Referred: No Living Expenses: Own Money Management: Patient Does the patient have any problems obtaining your medications?: No Home Management: Self-daughter limited due to her back issues Patient/Family Preliminary Plans: Return home to his home if possible if he is mod/i or can go to his daughter's home if he  needs someone with him. Daughter can do supervision level only so pt will need to be mobile before he leaves here. Await therapy evaluations today. Sw Barriers to Discharge: Decreased caregiver support Sw Barriers to Discharge Comments: Daughter has health sisues back surgery coming up-supervision levle only Social Work Anticipated Follow Up Needs: HH/OP, Support Group  Clinical Impression Pleasant gentleman who is motivated and has ben on the rehab unit-8 years ago. He was doing well until this and lived alone with daughter checking on him daily. She is very involved and will assist if needed. Will await team's evaluations and work on a safe discharge plan. May benefit from neuro-psych for coping while here, still grieving his wife who died 1 1/2 years ago.  Elease Hashimoto 06/29/2017, 10:56 AM

## 2017-06-29 NOTE — Care Management Note (Signed)
[facility] Individual Statement of Services  Patient Name:  [name]  Date:  06/29/2017  Welcome to the [facility].  Our goal is to provide you with an individualized program based on your diagnosis and situation, designed to meet your specific needs.  With this comprehensive rehabilitation program, you will be expected to participate in at least 3 hours of rehabilitation therapies Monday-Friday, with modified therapy programming on the weekends.  Your rehabilitation program will include the following services:  Physical Therapy (PT), Occupational Therapy (OT), 24 hour per day rehabilitation nursing, Therapeutic Recreaction (TR), Neuropsychology, Case Management (Social Worker), Rehabilitation Medicine, Nutrition Services and Pharmacy Services  Weekly team conferences will be held on Wednesday to discuss your progress.  Your Social Worker will talk with you frequently to get your input and to update you on team discussions.  Team conferences with you and your family in attendance may also be held.  Expected length of stay: 10-14 days Overall anticipated outcome: supervision-set up/cueing  Depending on your progress and recovery, your program may change. Your Social Worker will coordinate services and will keep you informed of any changes. Your Social Worker's name and contact numbers are listed  below.  The following services may also be recommended but are not provided by the [facility] will be made to provide these services after discharge if needed.  Arrangements include referral to agencies that provide these services.  Your insurance has been verified to be:  UHC-Medicare Your primary doctor is:  Donnie Coffin  Pertinent information will be shared with your doctor and your insurance company.  Social  Worker:  Ovidio Kin, [place] or ([phone]  Information discussed with and copy given to patient by: Elease Hashimoto, 06/29/2017, 10:59 AM

## 2017-06-29 NOTE — Progress Notes (Signed)
[facility] PHYSICAL MEDICINE & REHABILITATION     PROGRESS NOTE  Subjective/Complaints:  Pt seen lying in bed this AM.  He states it took him some time to go to sleep and when he finally went to sleep he was woken up.  He is ready to begin therapies.  ROS: Denies CP, SOB, N/V/D.  Objective: Vital Signs: Blood pressure (!) 148/47, pulse 67, temperature 97.9 F (36.6 C), temperature source Oral, resp. rate 16, height 5\' 9"  (1.753 m), weight 88.1 kg (194 lb 3.6 oz), SpO2 97 %. No results found. No results for input(s): WBC, HGB, HCT, PLT in the last 72 hours. Recent Labs    06/27/17 0230 06/28/17 0344  NA 136 137  K 3.3* 3.6  CL 108 105  GLUCOSE 141* 116*  BUN 15 15  CREATININE 0.66 0.62  CALCIUM 7.5* 7.8*   CBG (last 3)  Recent Labs    06/28/17 1934 06/28/17 2129 06/29/17 0630  GLUCAP 217* 258* 94    Wt Readings from Last 3 Encounters:  06/29/17 88.1 kg (194 lb 3.6 oz)  06/22/17 79.8 kg (176 lb)  09/23/15 87.2 kg (192 lb 4.8 oz)    Physical Exam:  BP (!) 148/47 (BP Location: Right Arm)   Pulse 67   Temp 97.9 F (36.6 C) (Oral)   Resp 16   Ht 5\' 9"  (1.753 m)   Wt 88.1 kg (194 lb 3.6 oz)   SpO2 97%   BMI 28.68 kg/m  Gen: NAD. Vital signs reviewed.  HENT: Normocephalic. Atraumatic. Eyes:EOMare normal. No discharge.  Cardiovascular:Regular rate and rhythm. No  Respiratory:Effort normaland breath sounds normal.  GI: Bowel sounds are normal. He exhibitsno distension. Musculoskeletal: No edema. No tenderness in extremities. Neurological: He isalertand oriented Normal insight and awareness.  Motor: Left upper strength:  4+/5 proximal to distal.  Right upper extremity: 4+/5 proximal to distal.  Right lower extremity: 3/5 hip flexion, 4/5 knee extension and ankle dorsiflexion/plantarflexion.  Left lower extremity: 3/5 hip flexion,4/5 knee extension, 4+/5 ankle dorsiflexion and plantar flexion. Sensation diminished to light touch b/l LE distal to  calf Skin. Warm and dry Psychiatric: Patient pleasant and appropriate.  Assessment/Plan: 1. Functional deficits secondary to fall/urosepsis as well as history of CVA with right-sided residual weakness which require 3+ hours per day of interdisciplinary therapy in a comprehensive inpatient rehab setting. Physiatrist is providing close team supervision and 24 hour management of active medical problems listed below. Physiatrist and rehab team continue to assess barriers to discharge/monitor patient progress toward functional and medical goals.  Function:  Bathing Bathing position      Bathing parts      Bathing assist        Upper Body Dressing/Undressing Upper body dressing                    Upper body assist        Lower Body Dressing/Undressing Lower body dressing                                  Lower body assist        Toileting Toileting          Toileting assist     Transfers Chair/bed Clinical biochemist  Cognition Comprehension Comprehension assist level: Follows complex conversation/direction with extra time/assistive device  Expression Expression assist level: Expresses complex ideas: With extra time/assistive device  Social Interaction Social Interaction assist level: Interacts appropriately with others with medication or extra time (anti-anxiety, antidepressant).  Problem Solving Problem solving assist level: Solves complex problems: With extra time  Memory Memory assist level: More than reasonable amount of time    Medical Problem List and Plan: 1.  Debility secondary to rhabdomyolysis related to fall/urosepsis as well as history of CVA with right-sided residual weakness.    Begin CIR   Notes reviewed, images reviewed 2.  DVT Prophylaxis/Anticoagulation: Subcutaneous Lovenox. Monitor platelet counts in any signs of bleeding 3. Pain Management: Tylenol as needed 4.  Mood: Provide emotional support 5. Neuropsych: This patient is capable of making decisions on his own behalf. 6. Skin/Wound Care: Routine skin checks 7. Fluids/Electrolytes/Nutrition: Routine I&O's   BMP within acceptable range on 1/7 8. Hypertension. Norvasc 5 mg daily, Lopressor 25 mg twice a day.   Monitor with increased mobility 9. History of urethral stricture multiple dilations. Follow-up urology services.  Rhabdomyolysis continue Flomax 0.8 mg daily.     Will d/c foley and start voiding trial 10. PAF. Cardiac rate controlled. Continue aspirin and Plavix 11. Diabetes mellitus with peripheral neuropathy. Hemoglobin A1c 7.0. Lantus insulin 12 units daily. Check blood sugars before meals and at bedtime. Diabetic teaching   Monitor with increased mobility 12. ABLA  Hb 12.6 on 1/5   Cont to monitor  LOS (Days) 1 A FACE TO FACE EVALUATION WAS PERFORMED  [name] 06/29/2017 8:54 AM

## 2017-06-29 NOTE — IPOC Note (Signed)
Overall Plan of Care [facility]) Patient Details Name: ASER NYLUND MRN: [id number] DOB: 04-Apr-1941  Admitting Diagnosis: Debility  Hospital Problems: Active Problems:   Debility   Benign essential HTN   H/O urethral stricture   Acute blood loss anemia   Poorly controlled type 2 diabetes mellitus with peripheral neuropathy (HCC)    Functional Problem List: Nursing Bladder, Bowel, Skin Integrity, Sensory, Safety, Medication Management, Motor, Pain  PT Balance, Endurance, Motor, Safety, Sensory  OT Balance, Endurance  SLP    TR         Basic ADL's: OT Grooming, Bathing, Dressing, Toileting     Advanced  ADL's: OT Simple Meal Preparation, Laundry, Light Housekeeping     Transfers: PT Bed Mobility, Bed to Chair, Car, [employer], Floor  OT Toilet     Locomotion: PT Ambulation, Emergency planning/management officer, Stairs     Additional Impairments: OT    SLP        TR      Anticipated Outcomes Item Anticipated Outcome  Self Feeding independent  Swallowing      Basic self-care  supervision  Toileting  supervision   Bathroom Transfers supervision  Bowel/Bladder  Min assist (had prostate issues prior to admission)  Transfers  supervision  Locomotion  Supervision  Communication     Cognition     Pain  < 3  Safety/Judgment  Mod I   Therapy Plan: PT Intensity: Minimum of 1-2 x/day ,45 to 90 minutes PT Frequency: 5 out of 7 days PT Duration Estimated Length of Stay: 10-14 days OT Intensity: Minimum of 1-2 x/day, 45 to 90 minutes OT Frequency: 5 out of 7 days OT Duration/Estimated Length of Stay: 10-14 days      Team Interventions: Nursing Interventions Patient/Family Education, Pain Management, Bladder Management, Disease Management/Prevention, Medication Management, Discharge Planning, Skin Care/Wound Management  PT interventions Ambulation/gait training, Disease management/prevention, Pain management, Stair training, Visual/perceptual remediation/compensation,  Wheelchair propulsion/positioning, Therapeutic Activities, Patient/family education, DME/adaptive equipment instruction, Training and development officer, Cognitive remediation/compensation, Functional electrical stimulation, Psychosocial support, Therapeutic Exercise, UE/LE Strength taining/ROM, Skin care/wound management, Functional mobility training, Community reintegration, Discharge planning, Neuromuscular re-education, Splinting/orthotics, UE/LE Coordination activities  OT Interventions Training and development officer, DME/adaptive equipment instruction, Discharge planning, Self Care/advanced ADL retraining, Therapeutic Activities, UE/LE Coordination activities, Patient/family education, Therapeutic Exercise, UE/LE Strength taining/ROM, Functional mobility training  SLP Interventions    TR Interventions    SW/CM Interventions Discharge Planning, Psychosocial Support, Patient/Family Education   Barriers to Discharge MD  Medical stability  Nursing      PT Home environment access/layout, Wound Care pt w/ bruises/wounds on buttocks from fall, home has uneven surfaces (both high pile carpet and hardwood), ramped entrance  OT Decreased caregiver support Pt lived alone PTA, will need to go to his daughters house for supervison at discharge.    SLP      SW Decreased caregiver support Daughter has health sisues back surgery coming up-supervision levle only   Team Discharge Planning: Destination: PT-Home ,OT- Home , SLP-  Projected Follow-up: PT-Home health PT, OT-  Home health OT, 24 hour supervision/assistance, SLP-  Projected Equipment Needs: PT-To be determined, OT- None recommended by OT, SLP-  Equipment Details: PT-already has w/c, RW, rollator, single point cane, and bedside commode, OT-  Patient/family involved in discharge planning: PT- Patient,  OT-Patient, SLP-   MD ELOS: 10-14 days Medical Rehab Prognosis:  Excellent Assessment:  77 y.o. right handed male with history of hypertension,  atrial fibrillation maintained on aspirin but stopped taking approximately 3 months ago  due to nosebleeds, non-Hodgkin's lymphoma status post chemotherapy radiation, diabetes mellitus, urethral stricture with multiple UTIs status post multiple dilations, CVA 2010 with right-sided residual weakness and received inpatient rehabilitation services and maintained on Plavix. Presented 06/22/2017 after being found by his daughter in the bathroom unable to get up from the toilet for approximately 10 hours with diffuse weakness. Noted stumbling gait but denied falls. He had complaints of some dysuria.[name] MRI the brain negative for acute changes. WBC 15,200, BUN 33, creatinine 1.88. CPK elevated 12,430, Urine study showed multiple leukocytes large hemoglobin no RBCs no bacteria. Started on empiric antibiotic coverage as well as aggressive IV fluids. Bladder scan 475 mL's. Chest x-ray was negative. Renal ultrasound showed no evidence of hydronephrosis. Urine study negative nitrite and empiric Rocephin discontinued. Blood cultures remain negative. Urology consulted for evaluation of possible urethral stricture and difficult catheterization. A Foley catheter tube was placed. CKs trending down nicely.  Pt with resulting functional deficits with mobility, transfers, self-care.  Will set goals for Supervision with PT/OT.      See Team Conference Notes for weekly updates to the plan of care

## 2017-06-29 NOTE — Progress Notes (Addendum)
Physical Therapy Assessment and Plan  Patient Details  Name: [name] MRN: [id number] Date of Birth: March 09, 1941  PT Diagnosis: Abnormality of gait, Coordination disorder, Difficulty walking, Hemiplegia dominant, Impaired sensation and Muscle weakness Rehab Potential: Good ELOS: 10-14 days   Today's Date: 06/29/2017 PT Individual Time: 4742-5956 AND 1330-1445 PT Individual Time Calculation (min): 55 min  AND 75 min  Problem List:  Patient Active Problem List   Diagnosis Date Noted  . Benign essential HTN   . H/O urethral stricture   . Acute blood loss anemia   . Poorly controlled type 2 diabetes mellitus with peripheral neuropathy ([place])   . Debility 06/28/2017  . Personal history of urethral stricture 06/23/2017  . Rhabdomyolysis 06/23/2017  . Fever 09/23/2015  . SIRS (systemic inflammatory response syndrome) ([place]) 09/23/2015  . Near syncope 09/23/2015  . PAF (paroxysmal atrial fibrillation) ([place]) 09/23/2015  . Diabetes mellitus type 2, uncontrolled ([place]) 09/23/2015  . Stenosis of artery ([place])   . Vertebrobasilar artery syndrome   . Orthostatic hypotension   . Insulin dependent diabetes mellitus ([place])   . Falls frequently   . Weakness 08/15/2015  . Cellulitis of right upper arm 06/02/2014  . Blood poisoning   . Encephalopathy 05/29/2014  . Sepsis ([place]) 05/29/2014  . UTI (lower urinary tract infection) 09/16/2013  . Recurrent falls 09/16/2013  . History of CVA (cerebrovascular accident) 09/16/2013  . Leukocytosis 09/16/2013  . Thrombocytopenia ([place]) 09/16/2013  . 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 Diabetes mellitus ([place]) 10/09/2011  . Hypertension 10/09/2011  . A-fib [facility])     Past Medical History:  Past Medical History:  Diagnosis Date  . A-fib ([place])   . 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 Cataract   . Charcot-Marie-Tooth disease   . Diabetes mellitus   . Hypertension   . 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 SIRS (systemic inflammatory response syndrome) ([place]) 09/2015  . Stroke ([place])   . Weakness    Past Surgical History:  Past Surgical History:  Procedure Laterality Date  . ANKLE FUSION Left   . CATARACT EXTRACTION    . CIRCUMCISION    . TOE AMPUTATION     right foot third toe   . URETHRA SURGERY      Assessment & Plan Clinical Impression: Patient is a 77 y.o.right handed malewith history of hypertension, atrial fibrillation maintained on aspirin but stopped taking approximately 3 months ago due to nosebleeds, non-Hodgkin's lymphoma status post chemotherapy radiation, diabetes mellitus, urethral stricturewith multiple UTIsstatus post multiple dilations, 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 Per chart review patient lives alone. Independent with a rolling walker a straight point cane prior to admission. He still mows his own grass performs his own grocery shopping and still drives. One level home with 3 steps to entry.Daughter can assist as needed.Presented 01/01/2019after being found by his daughter in the bathroomunable to get up from the toilet for approximately 10 hourswith diffuse weakness. Noted stumbling gait but denied falls. He had complaints of some dysuria.Marland KitchenMRI the brain negative for acute changes.WBC 15,200, BUN 33, creatinine 1.88. CPK elevated 12,430, Urine study showed multiple leukocytes large hemoglobin no RBCs no bacteria. Started on empiric antibiotic coverage as well as aggressive IV fluids. Bladder scan 475 mL's. Chest x-ray was negative. Renal ultrasound showed no evidence of hydronephrosis.Urine study negative nitrite and urine culture recently incubated for better growth and  remains on empiric Rocephinthat has since been discontinued. Blood cultures remain negative.Urology consulted for evaluation of possible urethral stricture and difficult catheterization. A  Foley catheter tube was placed await plan for voiding trial. Placed on subcutaneous Lovenox for DVT prophylaxis. CKs trending down nicely 1538-280. Physicaland occupationaltherapy evaluationscompleted 01/03/2019with recommendations of physical medicine rehabilitation consult. Patient transferred to CIR on 06/28/2017 .   Patient currently requires mod with mobility secondary to muscle weakness, decreased cardiorespiratoy endurance, decreased coordination and decreased standing balance, hemiplegia and decreased balance strategies.  Prior to hospitalization, patient was modified independent  with mobility and lived with Alone in a House home.  Home access is 3Ramped entrance.  Patient will benefit from skilled PT intervention to maximize safe functional mobility, minimize fall risk and decrease caregiver burden for planned discharge home with 24 hour supervision.  Anticipate patient will benefit from follow up [place] at discharge.  PT - End of Session Activity Tolerance: Tolerates 10 - 20 min activity with multiple rests Endurance Deficit: Yes Endurance Deficit Description: decreased PT Assessment Rehab Potential (ACUTE/IP ONLY): Good PT Barriers to Discharge: Home environment access/layout;Wound Care PT Barriers to Discharge Comments: pt w/ bruises/wounds on buttocks from fall, home has uneven surfaces (both high pile carpet and hardwood), ramped entrance PT Patient demonstrates impairments in the following area(s): Balance;Endurance;Motor;Safety;Sensory PT Transfers Functional Problem(s): Bed Mobility;Bed to Chair;Car;Furniture;Floor PT Locomotion Functional Problem(s): Ambulation;Wheelchair Mobility;Stairs PT Plan PT Intensity: Minimum of 1-2 x/day ,45 to 90 minutes PT Frequency: 5 out of 7 days PT Duration Estimated Length of Stay: 10-14 days PT Treatment/Interventions: Ambulation/gait training;Disease management/prevention;Pain management;Stair training;Visual/perceptual  remediation/compensation;Wheelchair propulsion/positioning;Therapeutic Activities;Patient/family education;DME/adaptive equipment instruction;Balance/vestibular training;Cognitive remediation/compensation;Functional electrical stimulation;Psychosocial support;Therapeutic Exercise;UE/LE Strength taining/ROM;Skin care/wound management;Functional mobility training;Community reintegration;Discharge planning;Neuromuscular re-education;Splinting/orthotics;UE/LE Coordination activities PT Transfers Anticipated Outcome(s): supervision PT Locomotion Anticipated Outcome(s): Supervision PT Recommendation Recommendations for Other Services: Therapeutic Recreation consult Therapeutic Recreation Interventions: Outing/community reintergration Follow Up Recommendations: Home health PT Patient destination: Home Equipment Recommended: To be determined Equipment Details: already has w/c, RW, rollator, single point cane, and bedside commode  Skilled Therapeutic Intervention  Session 1:  Pt supine upon arrival and agreeable to session, c/o pain as listed below. PT instructed patient in PT Evaluation and initiated treatment intervention; see below for results. PT educated patient in [place], rehab potential, rehab goals, and discharge recommendations. Required increased time for all mobility 2/2 fatigue and moving pt cautiously for safety. Replaced to more appropriate fitting w/c for pt's size and height. Ended session in w/c and in care of RN, all needs met.   Session 2:  Pt in w/c upon arrival and agreeable to therapy, no c/o pain. Pt self-propelled w/c around unit in 100-150' bouts w/ supervision using BUEs. Worked on w/c mgmt to lower w/c so pt can get feet to floor to assist w/ self-propelling and for better UE grip on wheels. Pt performed LE strengthening exercise during this time including heel slides 3x20, knee marches 3x20, heel raises 3x20, and toe raises 3x20. Pt reports increase ease w/ self-propelling w/c after  adjustments were made, pt had decreased fatigue w/ self-propelling as well. Performed car transfer as well w/ mod assist overall 2/2 height of car, pt and pt's daughter both have trucks. Returned to room in w/c and transferred back to EOB and supine w/ min assist. Ended session in supine, call bell within reach and all needs met.   PT Evaluation Precautions/Restrictions Precautions Precautions: Fall Restrictions Weight Bearing Restrictions: No Pain Pain Assessment Pain Assessment: 0-10 Pain Score: 5  Pain Location:  Foot Pain Orientation: Right;Left Pain Descriptors / Indicators: Aching;Burning Pain Onset: On-going Home Living/Prior Functioning Home Living Available Help at Discharge: Family;Available 24 hours/day(able to move in with daughter temporarily at discharge, daughter able to provide 24/7 assistance/supervision) Type of Home: House Home Access: Ramped entrance Entrance Stairs-Number of Steps: 3 Entrance Stairs-Rails: Right Home Layout: One level Bathroom Shower/Tub: Chiropodist: Standard Bathroom Accessibility: Yes  Lives With: Alone Prior Function Level of Independence: Independent with basic ADLs;Independent with homemaking with ambulation;Independent with gait;Independent with transfers;Requires assistive device for independence(uses walker when carrying things around the house, cane all other times)  Able to Take Stairs?: Yes Driving: Yes Vocation: Retired Comments: PTA pt was indep with RW/SPC, managing bills, mowing grass, grocery shopping, driving, etc.  Vision/Perception  Perception Perception: Within Functional Limits Praxis Praxis: Intact  Cognition Overall Cognitive Status: No family/caregiver present to determine baseline cognitive functioning Arousal/Alertness: Awake/alert Orientation Level: Oriented X4 Attention: Sustained Sustained Attention: Appears intact Memory: Appears intact Awareness: Appears intact Problem Solving:  Appears intact Safety/Judgment: Appears intact Sensation Sensation Light Touch: Impaired by gross assessment(LE sensation absent in stocking distribution bilaterally, normal sensation at mid lower leg and above) Proprioception: Impaired by gross assessment(Impaired in LE stocking distribution bilaterally) Coordination Gross Motor Movements are Fluid and Coordinated: No Fine Motor Movements are Fluid and Coordinated: Yes Coordination and Movement Description: Very slow and cautious moving  Motor  Motor Motor: Hemiplegia;Other (comment) Motor - Skilled Clinical Observations: Mild R hemi, generalized weakness  Mobility Bed Mobility Bed Mobility: Rolling Right;Rolling Left;Supine to Sit;Sit to Supine Rolling Right: 5: Supervision Rolling Right Details: Verbal cues for technique Rolling Left: 5: Supervision Rolling Left Details: Verbal cues for technique Supine to Sit: 4: Min assist Supine to Sit Details: Verbal cues for technique;Manual facilitation for weight shifting Sit to Supine: 4: Min assist Sit to Supine - Details: Verbal cues for technique;Manual facilitation for weight shifting Transfers Transfers: Yes Sit to Stand: From elevated surface;3: Mod assist Sit to Stand Details: Verbal cues for technique;Tactile cues for weight beaing;Tactile cues for initiation;Tactile cues for placement Stand to Sit: 4: Min guard Stand Pivot Transfers: 4: Min Psychologist, occupational Details: Verbal cues for precautions/safety;Verbal cues for technique;Verbal cues for safe use of DME/AE Locomotion  Ambulation Ambulation: Yes Ambulation/Gait Assistance: 4: Min guard Assistive device: Rolling walker Gait Gait: Yes Gait Pattern: Impaired Gait Pattern: Poor foot clearance - right;Trunk flexed;Shuffle Gait velocity: Decreased Stairs / Additional Locomotion Stairs: No Architect: Yes Wheelchair Assistance: 5: Investment banker, operational Details: Verbal  cues for safe use of DME/AE;Verbal cues for precautions/safety;Verbal cues for Marketing executive: Both upper extremities Wheelchair Parts Management: Needs assistance Distance: 150'  Trunk/Postural Assessment  Cervical Assessment Cervical Assessment: Exceptions to WFL(forward head posture) Thoracic Assessment Thoracic Assessment: Within Functional Limits Lumbar Assessment Lumbar Assessment: Exceptions to WFL(posterior pelvic tilt ) Postural Control Postural Control: Within Functional Limits  Balance Balance Balance Assessed: Yes Static Sitting Balance Static Sitting - Balance Support: No upper extremity supported;Feet supported Static Sitting - Level of Assistance: 5: Stand by assistance Dynamic Sitting Balance Dynamic Sitting - Balance Support: No upper extremity supported;Feet supported;During functional activity Dynamic Sitting - Level of Assistance: 5: Stand by assistance Static Standing Balance Static Standing - Balance Support: Bilateral upper extremity supported;During functional activity Static Standing - Level of Assistance: 5: Stand by assistance Extremity Assessment  RLE Assessment RLE Assessment: Exceptions to WFL(4/5 globally, increased ankle stiffness) LLE Assessment LLE Assessment: Exceptions to WFL(4+ to 5/5 globally, increased ankle  stiffness)   See Function Navigator for Current Functional Status.   Refer to Care Plan for Long Term Goals  Recommendations for other services: Therapeutic Recreation  Outing/community reintegration  Discharge Criteria: Patient will be discharged from PT if patient refuses treatment 3 consecutive times without medical reason, if treatment goals not met, if there is a change in medical status, if patient makes no progress towards goals or if patient is discharged from hospital.  The above assessment, treatment plan, treatment alternatives and goals were discussed and mutually agreed upon: by patient  Harley Fitzwater K  Arnette 06/29/2017, 10:04 AM

## 2017-06-29 NOTE — Consult Note (Addendum)
[facility] Nurse wound consult note Reason for Consult: Consult requested for left toes and buttocks.  Pt states he fell at home and hit the toilet and developed bruising to his buttocks and toes from hitting objects. Wound type: Pt has dry scabs to 2nd 3rd and 4th toes, which remove easily.  2nd toe with intact skin underneath.  3rd toe and 4th toe with pink moist wound beds, each site is a partial thickness wound; approx .4X.4X.1cm.  5th toe outer area with .3X.3cm dark colored bruise with intact skin. Right buttock with linear patchy area; previous bruising and blister have evolved into  unstageable pressure injuries; location is 4X.2cm; dark loose eschar, no odor or drainage.  Left buttock with small area of similar appearance; .2X.2cm Injury POA: Yes Dressing procedure/placement/frequency: Foam dressing to protect left toes.  Santyl ointment for enzymatic debridement of nonviable tissue to right buttocks.  Discussed plan of care and pt verbalized understanding. Please re-consult if further assistance is needed.  Thank-you,  Julien Girt MSN, [place], [place], [place], [place]

## 2017-06-30 ENCOUNTER — Inpatient Hospital Stay (HOSPITAL_COMMUNITY): Payer: Medicare Other | Admitting: Occupational Therapy

## 2017-06-30 ENCOUNTER — Inpatient Hospital Stay (HOSPITAL_COMMUNITY): Payer: Medicare Other | Admitting: Physical Therapy

## 2017-06-30 DIAGNOSIS — E46 Unspecified protein-calorie malnutrition: Secondary | ICD-10-CM

## 2017-06-30 DIAGNOSIS — E1142 Type 2 diabetes mellitus with diabetic polyneuropathy: Secondary | ICD-10-CM

## 2017-06-30 DIAGNOSIS — D72829 Elevated white blood cell count, unspecified: Secondary | ICD-10-CM

## 2017-06-30 DIAGNOSIS — E8809 Other disorders of plasma-protein metabolism, not elsewhere classified: Secondary | ICD-10-CM

## 2017-06-30 LAB — GLUCOSE, CAPILLARY
GLUCOSE-CAPILLARY: 140 mg/dL — AB (ref 65–99)
Glucose-Capillary: 131 mg/dL — ABNORMAL HIGH (ref 65–99)
Glucose-Capillary: 189 mg/dL — ABNORMAL HIGH (ref 65–99)
Glucose-Capillary: 275 mg/dL — ABNORMAL HIGH (ref 65–99)

## 2017-06-30 MED ORDER — AMLODIPINE BESYLATE 10 MG PO TABS
10.0000 mg | ORAL_TABLET | Freq: Every day | ORAL | Status: DC
  Administered 2017-07-01 – 2017-07-09 (×8): 10 mg via ORAL
  Filled 2017-06-30 (×9): qty 1

## 2017-06-30 MED ORDER — PRO-STAT SUGAR FREE PO LIQD
30.0000 mL | Freq: Two times a day (BID) | ORAL | Status: DC
  Administered 2017-06-30 – 2017-07-08 (×16): 30 mL via ORAL
  Filled 2017-06-30 (×19): qty 30

## 2017-06-30 MED ORDER — AMLODIPINE BESYLATE 5 MG PO TABS
5.0000 mg | ORAL_TABLET | Freq: Once | ORAL | Status: AC
  Administered 2017-06-30: 5 mg via ORAL
  Filled 2017-06-30: qty 1

## 2017-06-30 MED ORDER — BETHANECHOL CHLORIDE 10 MG PO TABS
10.0000 mg | ORAL_TABLET | Freq: Three times a day (TID) | ORAL | Status: DC
  Administered 2017-06-30 – 2017-07-02 (×7): 10 mg via ORAL
  Filled 2017-06-30 (×7): qty 1

## 2017-06-30 NOTE — Progress Notes (Signed)
Occupational Therapy Session Note  Patient Details  Name: [name] MRN: [id number] Date of Birth: September 04, 1940  Today's Date: 06/30/2017 OT Individual Time: 1749-4496 OT Individual Time Calculation (min): 59 min    Short Term Goals: Week 1:  OT Short Term Goal 1 (Week 1): Pt will complete toilet transfers with min assist and use of the RW for support.   OT Short Term Goal 2 (Week 1): Pt will complete LB bathing sit to stand with min guard assist.  OT Short Term Goal 3 (Week 1): Pt will complete LB dressing with min guard assist sit to stand.  OT Short Term Goal 4 (Week 1): Pt will stand at the sink to complete at least 3 grooming tasks with min guard assist.    Skilled Therapeutic Interventions/Progress Updates:    Pt propelled his wheelchair to the therapy gym with supervision and one rest break, using all four extremities to push the wheelchair.  Once in the therapy gym, he transferred to the therapy mat with min assist using the RW for support.  Session focused on further sit to stand transitions as well as timing and sequencing of knee and hip extension.  Pt has improved with his ability to complete forward trunk flexion when initiating sit to stand but still demonstrates weakness, with the need for min assist at this time.  Hip extension is delayed as well resulting in the pt extending his knees and then bringing his trunk upright afterward.  Had him work on standing balance while engaged in therapeutic activity, at times requiring both hands.  He does exhibit LOB posteriorly when standing without UE support and having to complete reaching activities.  He finished session by ambulating all the way back to his room with min guard assist using the RW.  Once in the room he removed his LB clothing except for the brief with mod assist from therapist and transitioned to supine in the bed to rest.  Call button and phone in reach.    Therapy Documentation Precautions:  Precautions Precautions:  Fall Restrictions Weight Bearing Restrictions: No  Pain: Pain Assessment Pain Assessment: No/denies pain ADL: See Function Navigator for Current Functional Status.   Therapy/Group: Individual Therapy  Angeletta Goelz OTR/L 06/30/2017, 4:51 PM

## 2017-06-30 NOTE — Progress Notes (Signed)
Occupational Therapy Session Note  Patient Details  Name: [name] MRN: [id number] Date of Birth: 09-Apr-1941  Today's Date: 06/30/2017 OT Individual Time: 0917-1000 OT Individual Time Calculation (min): 43 min    Short Term Goals: Week 1:  OT Short Term Goal 1 (Week 1): Pt will complete toilet transfers with min assist and use of the RW for support.   OT Short Term Goal 2 (Week 1): Pt will complete LB bathing sit to stand with min guard assist.  OT Short Term Goal 3 (Week 1): Pt will complete LB dressing with min guard assist sit to stand.  OT Short Term Goal 4 (Week 1): Pt will stand at the sink to complete at least 3 grooming tasks with min guard assist.    Skilled Therapeutic Interventions/Progress Updates:    Pt completed shaving at the sink from wheelchair per request during the session.  He was able to open all containers, apply shaving cream, and shave using the RUE.  He needed increased time to complete shaving secondary to history of right hemiparesis.  Therapist had to provide min assist with shaving as well secondary to time as pt could not complete task in 40 mins.  He reports also using and electric razor at home as well.  Pt finished session with call button and phone in reach and still sitting in wheelchair at bedside.   Therapy Documentation Precautions:  Precautions Precautions: Fall Restrictions Weight Bearing Restrictions: No  Pain: Pain Assessment Pain Assessment: No/denies pain ADL: See Function Navigator for Current Functional Status.   Therapy/Group: Individual Therapy  Layana Konkel OTR/L 06/30/2017, 12:23 PM

## 2017-06-30 NOTE — Progress Notes (Signed)
[facility] PHYSICAL MEDICINE & REHABILITATION     PROGRESS NOTE  Subjective/Complaints:  Pt seen lying in bed this AM.  He slept better overnight.  He states he was "worn out" from therapies yesterday. He states he was told he needed to be cathed, but he refused.  He states he would rather have a UTI.   ROS: Denies CP, SOB, N/V/D.  Objective: Vital Signs: Blood pressure (!) 169/57, pulse 64, temperature 98.1 F (36.7 C), temperature source Oral, resp. rate 18, height 5\' 9"  (1.753 m), weight 88 kg (194 lb 0.1 oz), SpO2 98 %. No results found. Recent Labs    06/29/17 0925  WBC 13.1*  HGB 12.5*  HCT 37.3*  PLT 196   Recent Labs    06/28/17 0344 06/29/17 0925  NA 137 136  K 3.6 3.6  CL 105 103  GLUCOSE 116* 202*  BUN 15 17  CREATININE 0.62 0.72  CALCIUM 7.8* 7.8*   CBG (last 3)  Recent Labs    06/29/17 1728 06/29/17 2129 06/30/17 0619  GLUCAP 200* 219* 140*    Wt Readings from Last 3 Encounters:  06/30/17 88 kg (194 lb 0.1 oz)  06/22/17 79.8 kg (176 lb)  09/23/15 87.2 kg (192 lb 4.8 oz)    Physical Exam:  BP (!) 169/57   Pulse 64   Temp 98.1 F (36.7 C) (Oral)   Resp 18   Ht 5\' 9"  (1.753 m)   Wt 88 kg (194 lb 0.1 oz)   SpO2 98%   BMI 28.65 kg/m  Gen: NAD. Vital signs reviewed.  HENT: Normocephalic. Atraumatic. Eyes:EOMare normal. No discharge.  Cardiovascular:RRR. No  Respiratory:Effort normal and breath sounds normal.  GI: Bowel sounds are normal. He exhibitsno distension. Musculoskeletal: No edema. No tenderness in extremities. Neurological: He isalertand oriented Normal insight and awareness.  Motor: Left upper strength: 4+/5 proximal to distal.  Right upper extremity: 4+/5 proximal to distal.  Right lower extremity: 3/5 hip flexion, 4/5 knee extension and ankle dorsiflexion/plantarflexion (improving).  Left lower extremity: 3/5 hip flexion,4/5 knee extension, 4+/5 ankle dorsiflexion and plantar flexion. Sensation diminished to light  touch b/l LE distal to calf Skin. Warm and dry Psychiatric: Patient pleasant and appropriate.  Assessment/Plan: 1. Functional deficits secondary to fall/urosepsis as well as history of CVA with right-sided residual weakness which require 3+ hours per day of interdisciplinary therapy in a comprehensive inpatient rehab setting. Physiatrist is providing close team supervision and 24 hour management of active medical problems listed below. Physiatrist and rehab team continue to assess barriers to discharge/monitor patient progress toward functional and medical goals.  Function:  Bathing Bathing position   Position: Wheelchair/chair at sink  Bathing parts Body parts bathed by patient: Right arm, Left arm, Chest, Abdomen, Right upper leg, Left upper leg, Left lower leg, Right lower leg Body parts bathed by helper: Back, Front perineal area, Buttocks  Bathing assist Assist Level: Touching or steadying assistance(Pt > 75%)      Upper Body Dressing/Undressing Upper body dressing   What is the patient wearing?: Pull over shirt/dress     Pull over shirt/dress - Perfomed by patient: Thread/unthread right sleeve, Thread/unthread left sleeve, Put head through opening, Pull shirt over trunk          Upper body assist Assist Level: Supervision or verbal cues      Lower Body Dressing/Undressing Lower body dressing   What is the patient wearing?: Pants, Non-skid slipper socks     Pants- Performed by patient: Thread/unthread  right pants leg Pants- Performed by helper: Thread/unthread left pants leg, Pull pants up/down, Fasten/unfasten pants Non-skid slipper socks- Performed by patient: Don/doff right sock, Don/doff left sock                    Lower body assist        Toileting Toileting          Toileting assist     Transfers Chair/bed transfer   Chair/bed transfer method: Stand pivot Chair/bed transfer assist level: Moderate assist (Pt 50 - 74%/lift or lower) Chair/bed  transfer assistive device: Armrests, Medical sales representative     Max distance: 100' Assist level: Touching or steadying assistance (Pt > 75%)   Wheelchair   Type: Manual Max wheelchair distance: 150' Assist Level: Supervision or verbal cues  Cognition Comprehension Comprehension assist level: Follows complex conversation/direction with no assist  Expression Expression assist level: Expresses complex ideas: With no assist  Social Interaction Social Interaction assist level: Interacts appropriately with others - No medications needed.  Problem Solving Problem solving assist level: Solves complex problems: Recognizes & self-corrects  Memory Memory assist level: Complete Independence: No helper    Medical Problem List and Plan: 1.  Debility secondary to rhabdomyolysis related to fall/urosepsis as well as history of CVA with right-sided residual weakness.    Cont CIR 2.  DVT Prophylaxis/Anticoagulation: Subcutaneous Lovenox. Monitor platelet counts in any signs of bleeding 3. Pain Management: Tylenol as needed 4. Mood: Provide emotional support 5. Neuropsych: This patient is capable of making decisions on his own behalf. 6. Skin/Wound Care: Routine skin checks 7. Fluids/Electrolytes/Nutrition: Routine I&O's   BMP within acceptable range on 1/8, except for glucose 8. Hypertension.    Norvasc 5 mg daily, increased to 10 on 1/9   Lopressor 25 mg twice a day   Cont to monitor  9. History of urethral stricture multiple dilations. Follow-up urology services.  Rhabdomyolysis continue Flomax 0.8 mg daily.     Bethanechol started on 1/9   D/ced foley    Pt voiding with some retention 10. PAF. Cardiac rate controlled. Continue aspirin and Plavix 11. Diabetes mellitus with peripheral neuropathy. Hemoglobin A1c 7.0. Lantus insulin 12 units daily. Check blood sugars before meals and at bedtime. Diabetic teaching   Stabilizing, cont to monitor with increased mobility 12. ABLA  Hb  12.5 on 1/8   Cont to monitor 13. Leukocytosis   WBCs 13.1 on 1/8   Afebrile  UA/Ucx ordered 14. Hypoalbuminemia  Supplement initiated on 1/9  LOS (Days) 2 A FACE TO FACE EVALUATION WAS PERFORMED  Athol Bolds Lorie Phenix 06/30/2017 8:23 AM

## 2017-06-30 NOTE — Progress Notes (Signed)
Social Work Patient ID: [name], male   DOB: 1941-02-16, 77 y.o.   MRN: [id number]  Met with pt to discuss team conference goals supervision level and target discharge date 1/18. He would like to go back to his home but if he needs someone with him he will need to go to daughter's home then when able back to his home. He will not be cathed and has been refusing this. MD trying to check for a UTI. Discussed home health follow up and if any equipment needs. Will call daughter to see if any questions pt reports having a migraine today and will not come in until tomorrow

## 2017-06-30 NOTE — Progress Notes (Signed)
Pt urinated about 0200, but bladder scan post void was 425 ml. Pt refused to be cath stating, "It hurts whenever they do it". Pt was educated on the complications of not emptying his bladder, but he said that he will not change his mind about his decision.  Will continue monitoring

## 2017-06-30 NOTE — Progress Notes (Signed)
Physical Therapy Session Note  Patient Details  Name: [name] MRN: [id number] Date of Birth: July 17, 1940  Today's Date: 06/30/2017 PT Individual Time: 1115-1200 PT Individual Time Calculation (min): 45 min   Short Term Goals: Week 1:  PT Short Term Goal 1 (Week 1): Pt will perform sit<>stand transfer from low surface w/ min assist  PT Short Term Goal 2 (Week 1): Pt will ambulate 150' w/ LRAD w/ min assist  PT Short Term Goal 3 (Week 1): Pt will negotiate 4 steps w/ min assist  PT Short Term Goal 4 (Week 1): Pt will tolerate 30 min of OOB activity w/o increase in fatigue  Skilled Therapeutic Interventions/Progress Updates:    Pt seated in w/c upon therapist arrival, pt is agreeable to participate in therapy session. Pt reports no pain at this time, does have LE and UE soreness from therapy yesterday. Educated pt that soreness is normal when increasing activity level and strengthening muscles. Sit to stand Mod Assist from w/c to RW. Ambulation x 150 ft with RW and Min Assist for balance, pt keeps head down during ambulation and reports onset of dizziness if he keeps his head up. Pt reports this has been an issue for him since his prior CVA. Sit to/from stand x 5 reps from elevated mat with focus on UE placement and weight shift forwards to complete transfer safely, Min Assist. Pt remains Mod Assist for transfers to/from w/c due to lower height and decreased eccentric control when sitting. Toilet transfer Mod Assist with use of grab bars and RW. Pt left seated in w/c in room with needs in reach.   Therapy Documentation Precautions:  Precautions Precautions: Fall Restrictions Weight Bearing Restrictions: No  See Function Navigator for Current Functional Status.   Therapy/Group: Individual Therapy  Excell Seltzer, PT, DPT  06/30/2017, 1:22 PM

## 2017-06-30 NOTE — Progress Notes (Signed)
Physical Therapy Session Note  Patient Details  Name: [name] MRN: [id number] Date of Birth: 14-Mar-1941  Today's Date: 06/30/2017 PT Individual Time: 0805-0850 PT Individual Time Calculation (min): 45 min   Short Term Goals: Week 1:  PT Short Term Goal 1 (Week 1): Pt will perform sit<>stand transfer from low surface w/ min assist  PT Short Term Goal 2 (Week 1): Pt will ambulate 150' w/ LRAD w/ min assist  PT Short Term Goal 3 (Week 1): Pt will negotiate 4 steps w/ min assist  PT Short Term Goal 4 (Week 1): Pt will tolerate 30 min of OOB activity w/o increase in fatigue  Skilled Therapeutic Interventions/Progress Updates: Pt presented in bed agreeable to therapy. Performed rolling bed mobility to change brief, rolling min guard with cues for use of bed rail. Performed supine to sit min guard with HOB elevated and increased time. Performed sit to/from stand with bed elevated and min A. Cues for increasing anterior wt shift and for placing LLE fully on floor. Once in standing pt able to maintain fair standing balance to complete clothing management. Performed stand pivot to w/c and performed w/c propulsion throughout unit with use of BLE and BUE for endurance and strengthening. Pt remained in w/c at end of session with call bell within reach and needs met.      Therapy Documentation Precautions:  Precautions Precautions: Fall Restrictions Weight Bearing Restrictions: No General:   Vital Signs: Therapy Vitals Pulse Rate: 64 BP: (!) 169/57 Pain: Pain Assessment Pain Assessment: No/denies pain   See Function Navigator for Current Functional Status.   Therapy/Group: Individual Therapy  Armoni Depass  Grier Vu, PTA  06/30/2017, 10:02 AM

## 2017-06-30 NOTE — Patient Care Conference (Signed)
Inpatient RehabilitationTeam Conference and Plan of Care Update Date: 06/30/2017   Time: 2:30 PM    Patient Name: [name]      Medical Record Number: [id number]  Date of Birth: 1940/12/28 Sex: Male         Room/Bed: 4M10C/4M10C-01 Payor Info: Payor: Marine scientist / Plan: [employer] MEDICARE / Product Type: *No Product type* /    Admitting Diagnosis: Debility  Admit Date/Time:  06/28/2017  4:41 PM Admission Comments: No comment available   Primary Diagnosis:  <principal problem not specified> Principal Problem: <principal problem not specified>  Patient Active Problem List   Diagnosis Date Noted  . Hypoalbuminemia due to protein-calorie malnutrition ([facility])   . 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 Benign essential HTN   . H/O urethral stricture   . Acute blood loss anemia   . Poorly controlled type 2 diabetes mellitus with peripheral neuropathy ([place])   . Debility 06/28/2017  . Personal history of urethral stricture 06/23/2017  . Rhabdomyolysis 06/23/2017  . Fever 09/23/2015  . SIRS (systemic inflammatory response syndrome) ([place]) 09/23/2015  . Near syncope 09/23/2015  . PAF (paroxysmal atrial fibrillation) ([place]) 09/23/2015  . Diabetes mellitus type 2, uncontrolled ([place]) 09/23/2015  . 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 Vertebrobasilar artery syndrome   . Orthostatic hypotension   . Insulin dependent diabetes mellitus ([place])   . Falls frequently   . Weakness 08/15/2015  . Cellulitis of right upper arm 06/02/2014  . Blood poisoning   . Encephalopathy 05/29/2014  . Sepsis ([place]) 05/29/2014  . UTI (lower urinary tract infection) 09/16/2013  . Recurrent falls 09/16/2013  . History of CVA (cerebrovascular accident) 09/16/2013  . Leukocytosis 09/16/2013  . Thrombocytopenia ([place]) 09/16/2013  . 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 Diabetes mellitus ([place]) 10/09/2011  . Hypertension 10/09/2011  . A-fib [facility])     Expected Discharge Date: Expected  Discharge Date: 07/09/17  Team Members Present: Physician leading conference: Dr. Delice Lesch Social Worker Present: Ovidio Kin, LCSW Nurse Present: Leonette Nutting, RN PT Present: Leavy Cella, PT OT Present: Clyda Greener, OT SLP Present: Windell Moulding, SLP PPS Coordinator present : Daiva Nakayama, RN, CRRN     Current Status/Progress Goal Weekly Team Focus  Medical   Debility secondary to rhabdomyolysis related to fall/urosepsis as well as history of CVA with right-sided residual weakness  Improve mobility, self-care, leukocytosis, BP, urinary retention  See above   Bowel/Bladder   \  Void without cath, regain continence of B/B  Encourage pt to allow I/O if necessary   Swallow/Nutrition/ Hydration             ADL's   Pt completes UB selfcare at supervision level and LB selfcare at mod assist level.  Transfers are at a mod assist level as well with use of the RW for support.  supervision level overall  selfcare retraining, transfer training, balance retraining, pt/family education, neuromuscular re-education   Mobility   min guard gait w/ RW 100', supervisoin w/c 125', mod assist sit>stand, min assist stand pivot transfer w/ RW  supervision overall, household ambulator  functional LE strengthening, functional mobility, endurance   Communication             Safety/Cognition/ Behavioral Observations            Pain   C/O pain 4/10  Pain < 2/10  Assess Qshift and PRN   Skin                *  See Care Plan and progress notes for long and short-term goals.     Barriers to Discharge  Current Status/Progress Possible Resolutions Date Resolved   Physician    Medical stability     See above  Therapies, optimize BP meds, follow labs, meds for urinary retention      Nursing                  PT  Home environment access/layout;Wound Care  pt w/ bruises/wounds on buttocks from fall, home has uneven surfaces (both high pile carpet and hardwood), ramped entrance              OT Decreased  caregiver support  Pt lived alone PTA, will need to go to his daughters house for supervison at discharge.               SLP                SW Decreased caregiver support Daughter has health sisues back surgery coming up-supervision levle only            Discharge Planning/Teaching Needs:  HOme either to daughter's home or his home. Probably to daughter's where she can provide 24 hr supervision only due to her back issues. She plans to be here daily and observe pt in therapies.      Team Discussion:  Goals of supervision level due to balance and endurance. Timed tolieting for continence. Nursing trying tio check UA pt refuses to be I & O cath. MD adjusting BP and BS medicines. Pt has his own way of doing things. Daughter has been in and observed in therapies.  Revisions to Treatment Plan:  DC 1/18    Continued Need for Acute Rehabilitation Level of Care: The patient requires daily medical management by a physician with specialized training in physical medicine and rehabilitation for the following conditions: Daily direction of a multidisciplinary physical rehabilitation program to ensure safe treatment while eliciting the highest outcome that is of practical value to the patient.: Yes Daily medical management of patient stability for increased activity during participation in an intensive rehabilitation regime.: Yes Daily analysis of laboratory values and/or radiology reports with any subsequent need for medication adjustment of medical intervention for : Blood pressure problems;Urological problems  Alexis Reber, Gardiner Rhyme 07/01/2017, 8:54 AM

## 2017-07-01 ENCOUNTER — Inpatient Hospital Stay (HOSPITAL_COMMUNITY): Payer: Medicare Other | Admitting: Physical Therapy

## 2017-07-01 ENCOUNTER — Inpatient Hospital Stay (HOSPITAL_COMMUNITY): Payer: Medicare Other | Admitting: Occupational Therapy

## 2017-07-01 ENCOUNTER — Other Ambulatory Visit: Payer: Self-pay

## 2017-07-01 DIAGNOSIS — R3915 Urgency of urination: Secondary | ICD-10-CM

## 2017-07-01 LAB — URINALYSIS, COMPLETE (UACMP) WITH MICROSCOPIC
Bilirubin Urine: NEGATIVE
GLUCOSE, UA: 50 mg/dL — AB
Ketones, ur: NEGATIVE mg/dL
NITRITE: NEGATIVE
PROTEIN: NEGATIVE mg/dL
SPECIFIC GRAVITY, URINE: 1.014 (ref 1.005–1.030)
pH: 5 (ref 5.0–8.0)

## 2017-07-01 LAB — GLUCOSE, CAPILLARY
GLUCOSE-CAPILLARY: 243 mg/dL — AB (ref 65–99)
Glucose-Capillary: 107 mg/dL — ABNORMAL HIGH (ref 65–99)
Glucose-Capillary: 138 mg/dL — ABNORMAL HIGH (ref 65–99)
Glucose-Capillary: 138 mg/dL — ABNORMAL HIGH (ref 65–99)

## 2017-07-01 NOTE — Progress Notes (Signed)
[facility] PHYSICAL MEDICINE & REHABILITATION     PROGRESS NOTE  Subjective/Complaints:  Patient seen lying in bed this morning. He notes improvement in bladder, but continues to have urinary urgency.  ROS: + Urinary urgency. Denies CP, SOB, N/V/D.  Objective: Vital Signs: Blood pressure (!) 161/60, pulse 66, temperature 98.9 F (37.2 C), temperature source Oral, resp. rate 16, height 5\' 9"  (1.753 m), weight 88 kg (194 lb 0.1 oz), SpO2 100 %. No results found. Recent Labs    06/29/17 0925  WBC 13.1*  HGB 12.5*  HCT 37.3*  PLT 196   Recent Labs    06/29/17 0925  NA 136  K 3.6  CL 103  GLUCOSE 202*  BUN 17  CREATININE 0.72  CALCIUM 7.8*   CBG (last 3)  Recent Labs    06/30/17 1658 06/30/17 2130 07/01/17 0622  GLUCAP 189* 275* 107*    Wt Readings from Last 3 Encounters:  06/30/17 88 kg (194 lb 0.1 oz)  06/22/17 79.8 kg (176 lb)  09/23/15 87.2 kg (192 lb 4.8 oz)    Physical Exam:  BP (!) 161/60   Pulse 66   Temp 98.9 F (37.2 C) (Oral)   Resp 16   Ht 5\' 9"  (1.753 m)   Wt 88 kg (194 lb 0.1 oz)   SpO2 100%   BMI 28.65 kg/m  Gen: NAD. Vital signs reviewed.  HENT: Normocephalic. Atraumatic. Eyes:EOMare normal. No discharge.  Cardiovascular:RRR. No  Respiratory:Effort normal and breath sounds normal.  GI: Bowel sounds are normal. He exhibitsno distension. Musculoskeletal: No edema. No tenderness in extremities. Neurological: He isalertand oriented Normal insight and awareness.  Motor: Left upper strength: 4+/5 proximal to distal.  Right upper extremity: 4+/5 proximal to distal.  Right lower extremity: 4/5 hip flexion, 4+/5 knee extension and ankle dorsiflexion/plantarflexion.  Left lower extremity: 4/5 hip flexion,4+/5 knee extension, 4+/5 ankle dorsiflexion and plantar flexion. Sensation diminished to light touch b/l LE distal to calf Skin. Warm and dry Psychiatric: Patient pleasant and appropriate.  Assessment/Plan: 1. Functional deficits  secondary to fall/urosepsis as well as history of CVA with right-sided residual weakness which require 3+ hours per day of interdisciplinary therapy in a comprehensive inpatient rehab setting. Physiatrist is providing close team supervision and 24 hour management of active medical problems listed below. Physiatrist and rehab team continue to assess barriers to discharge/monitor patient progress toward functional and medical goals.  Function:  Bathing Bathing position   Position: Wheelchair/chair at sink  Bathing parts Body parts bathed by patient: Right arm, Left arm, Chest, Abdomen, Right upper leg, Left upper leg, Left lower leg, Right lower leg Body parts bathed by helper: Back, Front perineal area, Buttocks  Bathing assist Assist Level: Touching or steadying assistance(Pt > 75%)      Upper Body Dressing/Undressing Upper body dressing   What is the patient wearing?: Pull over shirt/dress     Pull over shirt/dress - Perfomed by patient: Thread/unthread right sleeve, Thread/unthread left sleeve, Put head through opening, Pull shirt over trunk          Upper body assist Assist Level: Supervision or verbal cues      Lower Body Dressing/Undressing Lower body dressing   What is the patient wearing?: Pants, Non-skid slipper socks     Pants- Performed by patient: Thread/unthread right pants leg Pants- Performed by helper: Thread/unthread left pants leg, Pull pants up/down, Fasten/unfasten pants Non-skid slipper socks- Performed by patient: Don/doff right sock, Don/doff left sock  Lower body assist        Toileting Toileting          Toileting assist     Transfers Chair/bed transfer   Chair/bed transfer method: Ambulatory Chair/bed transfer assist level: Moderate assist (Pt 50 - 74%/lift or lower) Chair/bed transfer assistive device: Armrests, Medical sales representative     Max distance: 150' Assist level: Touching or steadying  assistance (Pt > 75%)   Wheelchair   Type: Manual Max wheelchair distance: 150' Assist Level: Supervision or verbal cues  Cognition Comprehension Comprehension assist level: Follows complex conversation/direction with extra time/assistive device  Expression Expression assist level: Expresses complex ideas: With extra time/assistive device  Social Interaction Social Interaction assist level: Interacts appropriately with others - No medications needed.  Problem Solving Problem solving assist level: Solves complex problems: With extra time  Memory Memory assist level: Complete Independence: No helper    Medical Problem List and Plan: 1.  Debility secondary to rhabdomyolysis related to fall/urosepsis as well as history of CVA with right-sided residual weakness.    Cont CIR 2.  DVT Prophylaxis/Anticoagulation: Subcutaneous Lovenox. Monitor platelet counts in any signs of bleeding 3. Pain Management: Tylenol as needed 4. Mood: Provide emotional support 5. Neuropsych: This patient is capable of making decisions on his own behalf. 6. Skin/Wound Care: Routine skin checks 7. Fluids/Electrolytes/Nutrition: Routine I&O's   BMP within acceptable range on 1/8, except for glucose 8. Hypertension.    Norvasc 5 mg daily, increased to 10 on 1/9   Lopressor 25 mg twice a day   Remains elevated, will consider further increase tomorrow. 9. History of urethral stricture multiple dilations. Follow-up urology services.  Rhabdomyolysis continue Flomax 0.8 mg daily.     Bethanechol started on 1/9   D/ced foley    Pt voiding with some retention 10. PAF. Cardiac rate controlled. Continue aspirin and Plavix 11. Diabetes mellitus with peripheral neuropathy. Hemoglobin A1c 7.0. Lantus insulin 12 units daily. Check blood sugars before meals and at bedtime. Diabetic teaching   Slightly labile, but overall controlled on 1/10 12. ABLA  Hb 12.5 on 1/8   Cont to monitor 13. Leukocytosis   WBCs 13.1 on 1/8    Afebrile  UA/Ucx pending 14. Hypoalbuminemia  Supplement initiated on 1/9  LOS (Days) 3 A FACE TO FACE EVALUATION WAS PERFORMED  Tierany Appleby Lorie Phenix 07/01/2017 8:57 AM

## 2017-07-01 NOTE — Progress Notes (Signed)
Social Work Patient ID: [name], male   DOB: [birth year], 77 y.o.   MRN: [id number]  Have given pt information on [employer], [employer] and [employer] for he and his daughter to go over and when she is here. Pt is interesting in getting a system when he returns home so he doesn't get stuck again and not be able to let anyone know. He sat on the commode for hours according to him.

## 2017-07-01 NOTE — Progress Notes (Signed)
Occupational Therapy Session Note  Patient Details  Name: [name] MRN: [id number] Date of Birth: 12-Feb-1941  Today's Date: 07/01/2017 OT Individual Time: 1447-1530 OT Individual Time Calculation (min): 43 min    Short Term Goals: Week 1:  OT Short Term Goal 1 (Week 1): Pt will complete toilet transfers with min assist and use of the RW for support.   OT Short Term Goal 2 (Week 1): Pt will complete LB bathing sit to stand with min guard assist.  OT Short Term Goal 3 (Week 1): Pt will complete LB dressing with min guard assist sit to stand.  OT Short Term Goal 4 (Week 1): Pt will stand at the sink to complete at least 3 grooming tasks with min guard assist.    Skilled Therapeutic Interventions/Progress Updates:    Pt completed wheelchair mobility to the ADL apartment with supervision.  He was able to complete wheelchair to bed transfer and bed to to couch transfer with min assist using the RW.  Mod assist needed for sit to stand from the lower couch however.  Pt then ambulated to the kitchen and utilized the RW for transporting items around using the counter tops and flat surfaces with min guard assist.  Pt has walker tray at home for use with meals as well and he transports them as needed.  He reports preparing only cold items or using microwave meals, no stove use.  Had pt ambulate back to the room with the RW and min guard.  Once on the EOB he removed all LB clothing and button up shirt.  Mod assist for sit to stand from EOB without gripper socks on secondary to feet sliding in front of him.  Pt voices awareness that this is not safe as well, but therapist had him complete to see if he could weight bear over his feet without them sliding.  Finished session with pt in bed, bed alarm on, and call button and phone in reach.    Therapy Documentation Precautions:  Precautions Precautions: Fall Restrictions Weight Bearing Restrictions: No  Pain: Pain Assessment Pain Assessment:  No/denies pain ADL: See Function Navigator for Current Functional Status.   Therapy/Group: Individual Therapy  Novice Vrba OTR/L 07/01/2017, 4:15 PM

## 2017-07-01 NOTE — Progress Notes (Signed)
Physical Therapy Session Note  Patient Details  Name: [name] MRN: [id number] Date of Birth: October 10, 1940  Today's Date: 07/01/2017 PT Individual Time: 0930-1010 AND 1300-1356 PT Individual Time Calculation (min): 40 min AND 56 min  Short Term Goals: Week 1:  PT Short Term Goal 1 (Week 1): Pt will perform sit<>stand transfer from low surface w/ min assist  PT Short Term Goal 2 (Week 1): Pt will ambulate 150' w/ LRAD w/ min assist  PT Short Term Goal 3 (Week 1): Pt will negotiate 4 steps w/ min assist  PT Short Term Goal 4 (Week 1): Pt will tolerate 30 min of OOB activity w/o increase in fatigue  Skilled Therapeutic Interventions/Progress Updates:   Session 1:  Pt supine upon arrival and agreeable to therapy, c/o 1/10 pain in bilateral feet and some soreness throughout whole body from therapies. Ambulated to/from gym, ~200' w/ min guard using RW to work on endurance. Practiced sit<>stands from average height chair w/ min assist fading to close supervision after multiple reps. Pt able to verbalize cues and technique for sit<>stand transfer w/o prompting, "nose over toes" and appropriate hand placement. 5x sit<>stand w/ RW support in standing and 3x sit<>stand w/o UE support to stabilize. Returned to room and ended session sitting up in recliner, call bell within reach and all needs met.    Session 2:  Pt in w/c upon arrival and agreeable to therapy, no c/o pain. Set-up assist to don shoes w/ supervision. Pt self-propelled w/c to/from gym w/ supervision to work on independence w/ mobility. Worked on LE strengthening and functional endurance this session. Negotiated 4 steps w/ bilateral handrails w/ min guard for safety. Discussed home set-up at daughter's house (likely discharge location). Daughter has 3 steps in front w/o rails and 4-5 steps in back w/ 1 rail, pt agrees using back entrance w/ rail will be safest way to get in and out of home. Additionally discussed energy conservation  techniques throughout session while utilizing help from daughter, son-in-law, and grandson at daughter's house. Pt verbalized understanding that he will likely need 24/7 supervision upon discharge for some time as his endurance and strength improves. Performed NuStep @ L1 for 10 min to work on cardiovascular endurance, 13 on Borg scale. Performed Berg Balance Scale 2nd half of session, scored 27/56. Returned to room in w/c and transferred to EOB and to supine w/ min assist. Ended session in supine, call bell within reach and all needs met.   Therapy Documentation Precautions:  Precautions Precautions: Fall Restrictions Weight Bearing Restrictions: No Vital Signs: Therapy Vitals Pulse Rate: 66 BP: (!) 161/60 Pain: Pain Assessment Pain Assessment: 0-10 Pain Score: 1  Pain Type: Neuropathic pain Pain Location: Foot Pain Orientation: Right;Left  See Function Navigator for Current Functional Status.   Therapy/Group: Individual Therapy  [name] 07/01/2017, 11:00 AM

## 2017-07-01 NOTE — Progress Notes (Signed)
Occupational Therapy Session Note  Patient Details  Name: RONAV FURNEY MRN: [id number] Date of Birth: 12/13/1940  Today's Date: 07/01/2017 OT Individual Time: 3428-7681 OT Individual Time Calculation (min): 54 min    Short Term Goals: Week 1:  OT Short Term Goal 1 (Week 1): Pt will complete toilet transfers with min assist and use of the RW for support.   OT Short Term Goal 2 (Week 1): Pt will complete LB bathing sit to stand with min guard assist.  OT Short Term Goal 3 (Week 1): Pt will complete LB dressing with min guard assist sit to stand.  OT Short Term Goal 4 (Week 1): Pt will stand at the sink to complete at least 3 grooming tasks with min guard assist.    Skilled Therapeutic Interventions/Progress Updates:    Pt completed bathing and dressing sit to stand at the sink during session.  Min assist for transfers and for sit to stand from the wheelchair with all bathing and dressing of his LB.  He was able to complete all UB selfcare and donning pullover shirt with supervision as well, except for washing his back.  He still exhibits LOB posteriorly in standing when attempting to tuck his shirt in and fasten his belt, without UE support on the sink or walker.  Pt left in wheelchair at end of session with call button and phone in reach.    Therapy Documentation Precautions:  Precautions Precautions: Fall Restrictions Weight Bearing Restrictions: No  Pain: Pain Assessment Pain Assessment: No/denies pain Pain Score: 1  Pain Type: Neuropathic pain Pain Location: Foot Pain Orientation: Right;Left ADL: See Function Navigator for Current Functional Status.   Therapy/Group: Individual Therapy  Sherell Christoffel OTR/L 07/01/2017, 12:18 PM

## 2017-07-02 ENCOUNTER — Inpatient Hospital Stay (HOSPITAL_COMMUNITY): Payer: Medicare Other | Admitting: Physical Therapy

## 2017-07-02 ENCOUNTER — Inpatient Hospital Stay (HOSPITAL_COMMUNITY): Payer: Medicare Other | Admitting: Occupational Therapy

## 2017-07-02 DIAGNOSIS — N390 Urinary tract infection, site not specified: Secondary | ICD-10-CM

## 2017-07-02 DIAGNOSIS — R339 Retention of urine, unspecified: Secondary | ICD-10-CM

## 2017-07-02 LAB — GLUCOSE, CAPILLARY
GLUCOSE-CAPILLARY: 147 mg/dL — AB (ref 65–99)
Glucose-Capillary: 128 mg/dL — ABNORMAL HIGH (ref 65–99)
Glucose-Capillary: 127 mg/dL — ABNORMAL HIGH (ref 65–99)
Glucose-Capillary: 177 mg/dL — ABNORMAL HIGH (ref 65–99)

## 2017-07-02 MED ORDER — NITROFURANTOIN MONOHYD MACRO 100 MG PO CAPS
100.0000 mg | ORAL_CAPSULE | Freq: Two times a day (BID) | ORAL | Status: AC
  Administered 2017-07-02 – 2017-07-08 (×14): 100 mg via ORAL
  Filled 2017-07-02 (×14): qty 1

## 2017-07-02 MED ORDER — BETHANECHOL CHLORIDE 25 MG PO TABS
25.0000 mg | ORAL_TABLET | Freq: Three times a day (TID) | ORAL | Status: DC
  Administered 2017-07-02 – 2017-07-03 (×3): 25 mg via ORAL
  Filled 2017-07-02 (×4): qty 1

## 2017-07-02 NOTE — Progress Notes (Signed)
Occupational Therapy Session Note  Patient Details  Name: [name] MRN: [id number] Date of Birth: Aug 09, 1940  Today's Date: 07/02/2017 OT Individual Time: 1104-1200 OT Individual Time Calculation (min): 56 min    Short Term Goals: Week 1:  OT Short Term Goal 1 (Week 1): Pt will complete toilet transfers with min assist and use of the RW for support.   OT Short Term Goal 2 (Week 1): Pt will complete LB bathing sit to stand with min guard assist.  OT Short Term Goal 3 (Week 1): Pt will complete LB dressing with min guard assist sit to stand.  OT Short Term Goal 4 (Week 1): Pt will stand at the sink to complete at least 3 grooming tasks with min guard assist.    Skilled Therapeutic Interventions/Progress Updates:    Pt completed donning shoes with setup on the EOB during session.  He was able to then ambulate to the therapy gym with min guard assist using the RW for support.  Once in the gym, concentrated therapy session on static and dynamic standing balance.  Has pt stand to complete peg diagram task while having to reach side to side to pick up pegs.  One LOB to the right side noted when reaching.  Next had him work on standing on foam surface statically.  Mod hand held assist needed to step onto the foam with min assist, holding therapist hand to keep his balance for 2 mins.  Second attempt had pt use the RW to step up on the foam with min assist.  Once on the foam the walker was removed and he maintained static standing for 2 mins with occasional min assist for balance.  Pt unable to use ankle and hip strategies efficiently to complete task.  Finished session with functional mobility back to the room with the RW.  Call button and phone in reach with pt sitting in wheelchair at bedside.    Therapy Documentation Precautions:  Precautions Precautions: Fall Restrictions Weight Bearing Restrictions: No  Pain: Pain Assessment Pain Assessment: No/denies pain ADL: See Function  Navigator for Current Functional Status.   Therapy/Group: Individual Therapy  Teisha Trowbridge OTR/L 07/02/2017, 12:12 PM

## 2017-07-02 NOTE — Progress Notes (Signed)
Physical Therapy Session Note  Patient Details  Name: [name] MRN: [id number] Date of Birth: [birth year]  Today's Date: 07/02/2017 PT Individual Time: 0800-0910 AND 1300-1400 PT Individual Time Calculation (min): 70 min AND 60 min  Short Term Goals: Week 1:  PT Short Term Goal 1 (Week 1): Pt will perform sit<>stand transfer from low surface w/ min assist  PT Short Term Goal 2 (Week 1): Pt will ambulate 150' w/ LRAD w/ min assist  PT Short Term Goal 3 (Week 1): Pt will negotiate 4 steps w/ min assist  PT Short Term Goal 4 (Week 1): Pt will tolerate 30 min of OOB activity w/o increase in fatigue  Skilled Therapeutic Interventions/Progress Updates:   Session 1:  Pt sitting at EOB upon arrival and agreeable to therapy, no c/o pain. Provided w/ set-up assist to don clothes, min assist to pull LE garments up over hips. Ambulated to/from therapy gym w/ close supervision using RW, ~200' each way, to work on functional endurance. Performed multiple sit<>stands in gym w/ emphasis on decreasing reliance on UE assist to stand and stabilize in stance. Performed standing tasks in 4-5 min bouts w/ emphasis on increasing thoracic extension in stance, maintaining static balance w/o UE support on walker while performing bimanual task, and increasing standing tolerance. Close supervision for all standing tasks, occasional min assist to boost into standing w/ increased fatigue. Performed [employer] limits of stability training, w/ Ue support 70% accuracy and 50% accuracy on beginner and intermediate levels respectively, 38% accuracy w/o UE support on beginner level. Occasional manual facilitation of hip strategies, especially for R anterior weight-shifting, verbal cues to engage hip strategies. Ambulated back to room and ended session sitting at EOB, call bell within reach and all needs met.   Pt noted multiple times throughout session that he felt as if he was voiding in his brief, but refused to go to bathroom  or back to room to change. Pt repeatedly stated, "It's easier to let the nurses clean me up in bed when I am done going in my brief". Discussed that pt needed to learn how to perform those tasks himself because his daughter cannot provide physical assistance at home. Discussed technique to change brief while sitting on commode that pt can perform himself, however he continued to refuse education. Made NT aware of pt's status at end of session and pt's refusal of self-care education.   Session 2:  Pt in w/c and agreeable to therapy, c/o LE soreness but tolerable. Ambulated to/from therapy gym w/ RW and close supervision to work on functional endurance. Performed NuStep 10 min @ L2 for LE strengthening when pt noted to have soiled LE garments. Ambulated back to room and repeated conversation described above, pt agreeable to try cleaning up w/ set-up assistance in standing. Required increased time once back in room to stand at EOB and perform pericare. Pt w/ urinary and bowel incontinence requiring min cues for technique to clean up. Ended session on bedside commode as pt reporting he needed to void again. Call bell within reach and all needs met. NT made aware of pt's status.   Therapy Documentation Precautions:  Precautions Precautions: Fall Restrictions Weight Bearing Restrictions: No Vital Signs:   See Function Navigator for Current Functional Status.   Therapy/Group: Individual Therapy  Oluwatomisin Deman K Arnette 07/02/2017, 11:03 AM

## 2017-07-02 NOTE — Progress Notes (Signed)
[facility] PHYSICAL MEDICINE & REHABILITATION     PROGRESS NOTE  Subjective/Complaints:  Pt seen sitting up at the edge of the bed this AM.  He slept well overnight.  He notes some improvement in bladder function.   ROS: Denies CP, SOB, N/V/D.  Objective: Vital Signs: Blood pressure (!) 143/68, pulse 68, temperature 97.9 F (36.6 C), temperature source Oral, resp. rate 16, height 5\' 9"  (1.753 m), weight 88 kg (194 lb 0.1 oz), SpO2 98 %. No results found. No results for input(s): WBC, HGB, HCT, PLT in the last 72 hours. No results for input(s): NA, K, CL, GLUCOSE, BUN, CREATININE, CALCIUM in the last 72 hours.  Invalid input(s): CO CBG (last 3)  Recent Labs    07/01/17 1637 07/01/17 2050 07/02/17 0637  GLUCAP 138* 243* 128*    Wt Readings from Last 3 Encounters:  06/30/17 88 kg (194 lb 0.1 oz)  06/22/17 79.8 kg (176 lb)  09/23/15 87.2 kg (192 lb 4.8 oz)    Physical Exam:  BP (!) 143/68 (BP Location: Left Arm)   Pulse 68   Temp 97.9 F (36.6 C) (Oral)   Resp 16   Ht 5\' 9"  (1.753 m)   Wt 88 kg (194 lb 0.1 oz)   SpO2 98%   BMI 28.65 kg/m  Gen: NAD. Vital signs reviewed.  HENT: Normocephalic. Atraumatic. Eyes:EOMare normal. No discharge.  Cardiovascular:RRR. No JVD. Respiratory:Effort normal and breath sounds normal.  GI: Bowel sounds are normal. He exhibitsno distension. Musculoskeletal: No edema. No tenderness in extremities. Neurological: He isalertand oriented Normal insight and awareness.  Motor: Left upper strength: 4+/5 proximal to distal.  Right upper extremity: 4+/5 proximal to distal.  Right lower extremity: 4/5 hip flexion, 4+/5 knee extension and ankle dorsiflexion/plantarflexion.  Left lower extremity: 4/5 hip flexion,4+/5 knee extension, 4+/5 ankle dorsiflexion and plantar flexion (stable). Sensation diminished to light touch b/l LE distal to calf (baseline) Skin. Warm and dry Psychiatric: Patient pleasant and  appropriate.  Assessment/Plan: 1. Functional deficits secondary to fall/urosepsis as well as history of CVA with right-sided residual weakness which require 3+ hours per day of interdisciplinary therapy in a comprehensive inpatient rehab setting. Physiatrist is providing close team supervision and 24 hour management of active medical problems listed below. Physiatrist and rehab team continue to assess barriers to discharge/monitor patient progress toward functional and medical goals.  Function:  Bathing Bathing position   Position: Wheelchair/chair at sink  Bathing parts Body parts bathed by patient: Right arm, Left arm, Chest, Abdomen, Right upper leg, Left upper leg, Right lower leg, Left lower leg, Front perineal area, Buttocks Body parts bathed by helper: Back  Bathing assist Assist Level: Touching or steadying assistance(Pt > 75%)      Upper Body Dressing/Undressing Upper body dressing   What is the patient wearing?: Pull over shirt/dress, Button up shirt     Pull over shirt/dress - Perfomed by patient: Thread/unthread right sleeve, Thread/unthread left sleeve, Put head through opening, Pull shirt over trunk   Button up shirt - Perfomed by patient: Thread/unthread right sleeve, Thread/unthread left sleeve, Button/unbutton shirt, Pull shirt around back      Upper body assist Assist Level: Supervision or verbal cues      Lower Body Dressing/Undressing Lower body dressing   What is the patient wearing?: Pants, Non-skid slipper socks     Pants- Performed by patient: Thread/unthread right pants leg, Thread/unthread left pants leg, Pull pants up/down, Fasten/unfasten pants Pants- Performed by helper: Thread/unthread left pants leg, Pull  pants up/down, Fasten/unfasten pants Non-skid slipper socks- Performed by patient: Don/doff right sock, Don/doff left sock                    Lower body assist Assist for lower body dressing: Touching or steadying assistance (Pt > 75%)       Toileting Toileting     Toileting steps completed by helper: Adjust clothing prior to toileting, Performs perineal hygiene, Adjust clothing after toileting(per Berkley Harvey, NT)    Toileting assist     Transfers Chair/bed transfer   Chair/bed transfer method: Stand pivot, Ambulatory Chair/bed transfer assist level: Touching or steadying assistance (Pt > 75%) Chair/bed transfer assistive device: Armrests, Medical sales representative     Max distance: 200' Assist level: Touching or steadying assistance (Pt > 75%)   Wheelchair   Type: Manual Max wheelchair distance: 200' Assist Level: Supervision or verbal cues  Cognition Comprehension Comprehension assist level: Follows complex conversation/direction with extra time/assistive device  Expression Expression assist level: Expresses complex ideas: With extra time/assistive device  Social Interaction Social Interaction assist level: Interacts appropriately with others - No medications needed.  Problem Solving Problem solving assist level: Solves complex problems: With extra time  Memory Memory assist level: Complete Independence: No helper    Medical Problem List and Plan: 1.  Debility secondary to rhabdomyolysis related to fall/urosepsis as well as history of CVA with right-sided residual weakness.    Cont CIR 2.  DVT Prophylaxis/Anticoagulation: Subcutaneous Lovenox. Monitor platelet counts in any signs of bleeding 3. Pain Management: Tylenol as needed 4. Mood: Provide emotional support 5. Neuropsych: This patient is capable of making decisions on his own behalf. 6. Skin/Wound Care: Routine skin checks 7. Fluids/Electrolytes/Nutrition: Routine I&O's   BMP within acceptable range on 1/8, except for glucose 8. Hypertension.    Norvasc 5 mg daily, increased to 10 on 1/9   Lopressor 25 mg twice a day   Appears to be improving 9. History of urethral stricture multiple dilations. Follow-up urology services.   Rhabdomyolysis continue Flomax 0.8 mg daily.     Bethanechol started on 1/9, increased on 1/11   D/ced foley    Pt voiding with retention 10. PAF. Cardiac rate controlled. Continue aspirin and Plavix 11. Diabetes mellitus with peripheral neuropathy. Hemoglobin A1c 7.0. Lantus insulin 12 units daily. Check blood sugars before meals and at bedtime. Diabetic teaching   Slightly labile, but appears to be improving on 1/11 12. ABLA  Hb 12.5 on 1/8   Cont to monitor 13. Leukocytosis   WBCs 13.1 on 1/8   Afebrile  UA +, Ucx pending   Empiric Macrobid started on 1/11   Labs ordered for Monday 14. Hypoalbuminemia  Supplement initiated on 1/9  LOS (Days) 4 A FACE TO FACE EVALUATION WAS PERFORMED  Silvana Holecek Lorie Phenix 07/02/2017 9:33 AM

## 2017-07-02 NOTE — Progress Notes (Signed)
Pt is voiding however he does have high PVR's on occasion. Pt is refusing to be straight cathed at this time. Risks and benefits reviewed with pt. Pt still refuses.

## 2017-07-03 ENCOUNTER — Inpatient Hospital Stay (HOSPITAL_COMMUNITY): Payer: Medicare Other

## 2017-07-03 ENCOUNTER — Inpatient Hospital Stay (HOSPITAL_COMMUNITY): Payer: Medicare Other | Admitting: Occupational Therapy

## 2017-07-03 ENCOUNTER — Inpatient Hospital Stay (HOSPITAL_COMMUNITY): Payer: Medicare Other | Admitting: Physical Therapy

## 2017-07-03 DIAGNOSIS — N39490 Overflow incontinence: Secondary | ICD-10-CM

## 2017-07-03 LAB — URINE CULTURE: Culture: 20000 — AB

## 2017-07-03 LAB — GLUCOSE, CAPILLARY
GLUCOSE-CAPILLARY: 133 mg/dL — AB (ref 65–99)
GLUCOSE-CAPILLARY: 148 mg/dL — AB (ref 65–99)
GLUCOSE-CAPILLARY: 220 mg/dL — AB (ref 65–99)
Glucose-Capillary: 106 mg/dL — ABNORMAL HIGH (ref 65–99)

## 2017-07-03 MED ORDER — LISINOPRIL 5 MG PO TABS
2.5000 mg | ORAL_TABLET | Freq: Every day | ORAL | Status: DC
  Administered 2017-07-03 – 2017-07-04 (×2): 2.5 mg via ORAL
  Filled 2017-07-03 (×3): qty 1

## 2017-07-03 MED ORDER — BETHANECHOL CHLORIDE 25 MG PO TABS
50.0000 mg | ORAL_TABLET | Freq: Three times a day (TID) | ORAL | Status: DC
  Administered 2017-07-03 – 2017-07-09 (×18): 50 mg via ORAL
  Filled 2017-07-03 (×18): qty 2

## 2017-07-03 NOTE — Progress Notes (Signed)
Occupational Therapy Session Note  Patient Details  Name: [name] MRN: [id number] Date of Birth: 06-17-1941  Today's Date: 07/03/2017 OT Individual Time: 0800-0900 OT Individual Time Calculation (min): 60 min    Short Term Goals: Week 1:  OT Short Term Goal 1 (Week 1): Pt will complete toilet transfers with min assist and use of the RW for support.   OT Short Term Goal 2 (Week 1): Pt will complete LB bathing sit to stand with min guard assist.  OT Short Term Goal 3 (Week 1): Pt will complete LB dressing with min guard assist sit to stand.  OT Short Term Goal 4 (Week 1): Pt will stand at the sink to complete at least 3 grooming tasks with min guard assist.    Skilled Therapeutic Interventions/Progress Updates:    1:1 Pt in bed when arrived. Ambulated with min guard to the bathroom for toileting. Pt able to perform toileting with setup - however when voiding with BSC over commode pt unable to get all urine in the toilet- voiding on LB clothing and floor. A to remove all wet clothing. Participated in bathing and dressing at sink level with setup. Pt able to perform sit to stands with min guard with encouragement for increased forward flexion to decr feet slipping on floor. Pt able to dress with setup with more than reasonable amt of time. Trial of use of SPC with functional ambulation with min A - min guard. Pt did demonstrate increased forward flexion without use of RW. During ambulation pt became incontinent of BM in pants. Returned to room to clean up.  Left pt on toilet to finish with nursing,   Therapy Documentation Precautions:  Precautions Precautions: Fall Restrictions Weight Bearing Restrictions: No Pain:  no c/o pain in session Other Treatments:    See Function Navigator for Current Functional Status.   Therapy/Group: Individual Therapy  Willeen Cass [facility] 07/03/2017, 8:44 AM

## 2017-07-03 NOTE — Progress Notes (Signed)
Physical Therapy Session Note  Patient Details  Name: MASSIMILIANO ROHLEDER MRN: [id number] Date of Birth: [birth year]  Today's Date: 07/03/2017 PT Individual Time: 8099-8338 PT Individual Time Calculation (min): 70 min   Short Term Goals: Week 1:  PT Short Term Goal 1 (Week 1): Pt will perform sit<>stand transfer from low surface w/ min assist  PT Short Term Goal 2 (Week 1): Pt will ambulate 150' w/ LRAD w/ min assist  PT Short Term Goal 3 (Week 1): Pt will negotiate 4 steps w/ min assist  PT Short Term Goal 4 (Week 1): Pt will tolerate 30 min of OOB activity w/o increase in fatigue  Skilled Therapeutic Interventions/Progress Updates:   Pt in w/c upon arrival and agreeable to therapy, c/o LE soreness and stiffness. Ambulated to gym using RW w/ supervision. Performed dynamic sitting and standing balance tasks w/ emphasis on responding to perturbations w/ trunk and postural control. Tossed beach ball in seated w/ 1# and 2# dowel rods, close supervision for safety and verbal cues for balance strategies. Performed dynamic standing tasks w/ UE support on cane w/ close supervision, threw horseshoes, sorted bean bags, and matching cards. Able to tolerate standing w/ cane for 5-10 minutes at a time before needing seated rest break. Practiced ambulating w/ cane in gym w/ verbal cues for gait pattern. When using cane, pt ambulates w/ antalgic gait pattern, flexed trunk posture, and shuffling pattern. He reports this is his baseline gait w/ the cane. Ambulated back to room w/ cane and min guard for safety. Ended session in w/c, call bell within reach and all needs met.   Therapy Documentation Precautions:  Precautions Precautions: Fall Restrictions Weight Bearing Restrictions: No  See Function Navigator for Current Functional Status.   Therapy/Group: Individual Therapy  Maryalice Pasley K Arnette 07/03/2017, 12:16 PM

## 2017-07-03 NOTE — Progress Notes (Signed)
[facility] PHYSICAL MEDICINE & REHABILITATION     PROGRESS NOTE  Subjective/Complaints:  Patient seen lying in bed this morning. He states he slept well overnight. He notes he had some incontinence overnight, but it is improving.  ROS: + Urinary incontinence. Denies CP, SOB, N/V/D.  Objective: Vital Signs: Blood pressure (!) 149/49, pulse (!) 59, temperature 97.8 F (36.6 C), temperature source Oral, resp. rate 16, height 5\' 9"  (1.753 m), weight 88 kg (194 lb 0.1 oz), SpO2 99 %. No results found. No results for input(s): WBC, HGB, HCT, PLT in the last 72 hours. No results for input(s): NA, K, CL, GLUCOSE, BUN, CREATININE, CALCIUM in the last 72 hours.  Invalid input(s): CO CBG (last 3)  Recent Labs    07/02/17 2040 07/03/17 0635 07/03/17 1154  GLUCAP 177* 106* 133*    Wt Readings from Last 3 Encounters:  06/30/17 88 kg (194 lb 0.1 oz)  06/22/17 79.8 kg (176 lb)  09/23/15 87.2 kg (192 lb 4.8 oz)    Physical Exam:  BP (!) 149/49 (BP Location: Right Arm)   Pulse (!) 59   Temp 97.8 F (36.6 C) (Oral)   Resp 16   Ht 5\' 9"  (1.753 m)   Wt 88 kg (194 lb 0.1 oz)   SpO2 99%   BMI 28.65 kg/m  Gen: NAD. Vital signs reviewed.  HENT: Normocephalic. Atraumatic. Eyes:EOMare normal. No discharge.  Cardiovascular:RRR. No JVD. Respiratory:Effort normal and breath sounds normal.  GI: Bowel sounds are normal. He exhibitsno distension. Musculoskeletal: No edema. No tenderness in extremities. Neurological: He isalertand oriented Normal insight and awareness.  Motor:  Right lower extremity: 4/5 hip flexion, 4+/5 knee extension and ankle dorsiflexion/plantarflexion.  Left lower extremity: 4/5 hip flexion,4+/5 knee extension, 4+/5 ankle dorsiflexion and plantar flexion (unchanged). Sensation diminished to light touch b/l LE distal to calf (baseline) Skin. Warm and dry Psychiatric: Patient pleasant and appropriate.  Assessment/Plan: 1. Functional deficits secondary to  fall/urosepsis as well as history of CVA with right-sided residual weakness which require 3+ hours per day of interdisciplinary therapy in a comprehensive inpatient rehab setting. Physiatrist is providing close team supervision and 24 hour management of active medical problems listed below. Physiatrist and rehab team continue to assess barriers to discharge/monitor patient progress toward functional and medical goals.  Function:  Bathing Bathing position   Position: Wheelchair/chair at sink  Bathing parts Body parts bathed by patient: Right arm, Left arm, Chest, Abdomen, Right upper leg, Left upper leg, Right lower leg, Left lower leg, Front perineal area, Buttocks Body parts bathed by helper: Back  Bathing assist Assist Level: Touching or steadying assistance(Pt > 75%)      Upper Body Dressing/Undressing Upper body dressing   What is the patient wearing?: Pull over shirt/dress, Button up shirt     Pull over shirt/dress - Perfomed by patient: Thread/unthread right sleeve, Thread/unthread left sleeve, Put head through opening, Pull shirt over trunk   Button up shirt - Perfomed by patient: Thread/unthread right sleeve, Thread/unthread left sleeve, Button/unbutton shirt, Pull shirt around back      Upper body assist Assist Level: Supervision or verbal cues      Lower Body Dressing/Undressing Lower body dressing   What is the patient wearing?: Pants, Non-skid slipper socks     Pants- Performed by patient: Thread/unthread right pants leg, Thread/unthread left pants leg, Pull pants up/down, Fasten/unfasten pants Pants- Performed by helper: Thread/unthread left pants leg, Pull pants up/down, Fasten/unfasten pants Non-skid slipper socks- Performed by patient: Don/doff right  sock, Don/doff left sock                    Lower body assist Assist for lower body dressing: Touching or steadying assistance (Pt > 75%)      Toileting Toileting     Toileting steps completed by helper:  Adjust clothing prior to toileting, Performs perineal hygiene, Adjust clothing after toileting    Toileting assist Assist level: Two helpers   Transfers Chair/bed transfer   Chair/bed transfer method: Ambulatory Chair/bed transfer assist level: Touching or steadying assistance (Pt > 75%) Chair/bed transfer assistive device: Armrests, Cane     Locomotion Ambulation     Max distance: 200' Assist level: Touching or steadying assistance (Pt > 75%)   Wheelchair   Type: Manual Max wheelchair distance: 200' Assist Level: Supervision or verbal cues  Cognition Comprehension Comprehension assist level: Follows complex conversation/direction with no assist, Understands basic 90% of the time/cues < 10% of the time  Expression Expression assist level: Expresses complex ideas: With no assist, Expresses basic 75 - 89% of the time/requires cueing 10 - 24% of the time. Needs helper to occlude trach/needs to repeat words.  Social Interaction Social Interaction assist level: Interacts appropriately with others - No medications needed., Interacts appropriately 75 - 89% of the time - Needs redirection for appropriate language or to initiate interaction.  Problem Solving Problem solving assist level: Solves complex problems: Recognizes & self-corrects, Solves basic 90% of the time/requires cueing < 10% of the time  Memory Memory assist level: Complete Independence: No helper, Recognizes or recalls 90% of the time/requires cueing < 10% of the time    Medical Problem List and Plan: 1.  Debility secondary to rhabdomyolysis related to fall/urosepsis as well as history of CVA with right-sided residual weakness.    Cont CIR 2.  DVT Prophylaxis/Anticoagulation: Subcutaneous Lovenox. Monitor platelet counts in any signs of bleeding 3. Pain Management: Tylenol as needed 4. Mood: Provide emotional support 5. Neuropsych: This patient is capable of making decisions on his own behalf. 6. Skin/Wound Care: Routine  skin checks 7. Fluids/Electrolytes/Nutrition: Routine I&O's   BMP within acceptable range on 1/8, except for glucose 8. Hypertension.    Norvasc 5 mg daily, increased to 10 on 1/9   Lisinopril 2.5 started on 1/12   Lopressor 25 mg twice a day   Appears to be improving 9. History of urethral stricture multiple dilations. Follow-up urology services.  Rhabdomyolysis continue Flomax 0.8 mg daily.     Bethanechol started on 1/9, increased on 1/11, increased again on 1/12   D/ced foley    Pt voiding with retention 10. PAF. Cardiac rate controlled. Continue aspirin and Plavix 11. Diabetes mellitus with peripheral neuropathy. Hemoglobin A1c 7.0. Lantus insulin 12 units daily. Check blood sugars before meals and at bedtime. Diabetic teaching   Overall controlled on 1/12 12. ABLA  Hb 12.5 on 1/8   Labs ordered for Monday   Cont to monitor 13. Leukocytosis   WBCs 13.1 on 1/8   Labs ordered for Monday   Afebrile  UA +, Ucx enterococcus faecalis   Empiric Macrobid started on 1/11-1/17   Labs ordered for Monday 14. Hypoalbuminemia  Supplement initiated on 1/9  LOS (Days) 5 A FACE TO FACE EVALUATION WAS PERFORMED  [name] Lorie Phenix 07/03/2017 12:35 PM

## 2017-07-03 NOTE — Progress Notes (Signed)
Physical Therapy Session Note  Patient Details  Name: [name] MRN: [id number] Date of Birth: 1941-06-18  Today's Date: 07/03/2017 PT Individual Time: 7939-0300 PT Individual Time Calculation (min): 58 min   Short Term Goals: Week 1:  PT Short Term Goal 1 (Week 1): Pt will perform sit<>stand transfer from low surface w/ min assist  PT Short Term Goal 2 (Week 1): Pt will ambulate 150' w/ LRAD w/ min assist  PT Short Term Goal 3 (Week 1): Pt will negotiate 4 steps w/ min assist  PT Short Term Goal 4 (Week 1): Pt will tolerate 30 min of OOB activity w/o increase in fatigue  Skilled Therapeutic Interventions/Progress Updates:    Pt seated in w/c upon PT arrival, agreeable to therapy tx and denies pain. Pt ambulated from room<>gym with SPC with min assist, verbal cues for techniques. Pt worked on dynamic standing balance without UE support to throw horse shoes, play connect 4, perform toe taps, complete pvc pipe puzzle while standing on foam, sidestepping, and participate in ball toss, supervision to min assist balance. Pt used nustep x 6 minutes on workload 5 for global strengthening and endurance. Performed 2 x 5 sit<>stands without UE support for strengthening from elevated mat. Pt ambulated back to room with SPC, min assist and left seated in w/c with needs in reach.   Therapy Documentation Precautions:  Precautions Precautions: Fall Restrictions Weight Bearing Restrictions: No   See Function Navigator for Current Functional Status.   Therapy/Group: Individual Therapy  Netta Corrigan, PT, DPT 07/03/2017, 3:46 PM

## 2017-07-04 ENCOUNTER — Inpatient Hospital Stay (HOSPITAL_COMMUNITY): Payer: Medicare Other

## 2017-07-04 LAB — GLUCOSE, CAPILLARY
GLUCOSE-CAPILLARY: 210 mg/dL — AB (ref 65–99)
Glucose-Capillary: 107 mg/dL — ABNORMAL HIGH (ref 65–99)
Glucose-Capillary: 133 mg/dL — ABNORMAL HIGH (ref 65–99)
Glucose-Capillary: 175 mg/dL — ABNORMAL HIGH (ref 65–99)

## 2017-07-04 NOTE — Progress Notes (Signed)
[place] PHYSICAL MEDICINE & REHABILITATION     PROGRESS NOTE  Subjective/Complaints:  Pt seen laying in bed this AM.  He states he slept well overnight, but had incontinence this AM.  He is unhappy with his food.  He does note that he is getting stronger.   ROS: +Urinary incontinence. Denies CP, SOB, N/V/D.  Objective: Vital Signs: Blood pressure (!) 150/58, pulse (!) 59, temperature 97.6 F (36.4 C), temperature source Oral, resp. rate 16, height 5\' 9"  (1.753 m), weight 88 kg (194 lb 0.1 oz), SpO2 100 %. No results found. No results for input(s): WBC, HGB, HCT, PLT in the last 72 hours. No results for input(s): NA, K, CL, GLUCOSE, BUN, CREATININE, CALCIUM in the last 72 hours.  Invalid input(s): CO CBG (last 3)  Recent Labs    07/03/17 1709 07/03/17 2118 07/04/17 0645  GLUCAP 148* 220* 107*    Wt Readings from Last 3 Encounters:  06/30/17 88 kg (194 lb 0.1 oz)  06/22/17 79.8 kg (176 lb)  09/23/15 87.2 kg (192 lb 4.8 oz)    Physical Exam:  BP (!) 150/58 (BP Location: Right Arm)   Pulse (!) 59   Temp 97.6 F (36.4 C) (Oral)   Resp 16   Ht 5\' 9"  (1.753 m)   Wt 88 kg (194 lb 0.1 oz)   SpO2 100%   BMI 28.65 kg/m  Gen: NAD. Vital signs reviewed.  HENT: Normocephalic. Atraumatic. Eyes:EOMare normal. No discharge.  Cardiovascular:RRR. No JVD. Respiratory:Effort normal and breath sounds normal.  GI: Bowel sounds are normal. He exhibitsno distension. Musculoskeletal: No edema. No tenderness in extremities. Neurological: He isalertand oriented Normal insight and awareness.  Motor:  Right lower extremity: 4/5 hip flexion, 4+/5 knee extension and ankle dorsiflexion/plantarflexion.  Left lower extremity: 4/5 hip flexion,4+/5 knee extension, 4+/5 ankle dorsiflexion and plantar flexion (improving). Sensation diminished to light touch b/l LE distal to calf (baseline) Skin. Warm and dry Psychiatric: Patient pleasant and appropriate.  Assessment/Plan: 1.  Functional deficits secondary to fall/urosepsis as well as history of CVA with right-sided residual weakness which require 3+ hours per day of interdisciplinary therapy in a comprehensive inpatient rehab setting. Physiatrist is providing close team supervision and 24 hour management of active medical problems listed below. Physiatrist and rehab team continue to assess barriers to discharge/monitor patient progress toward functional and medical goals.  Function:  Bathing Bathing position   Position: Wheelchair/chair at sink  Bathing parts Body parts bathed by patient: Right arm, Left arm, Chest, Abdomen, Right upper leg, Left upper leg, Right lower leg, Left lower leg, Front perineal area, Buttocks Body parts bathed by helper: Back  Bathing assist Assist Level: Touching or steadying assistance(Pt > 75%)      Upper Body Dressing/Undressing Upper body dressing   What is the patient wearing?: Pull over shirt/dress, Button up shirt     Pull over shirt/dress - Perfomed by patient: Thread/unthread right sleeve, Thread/unthread left sleeve, Put head through opening, Pull shirt over trunk   Button up shirt - Perfomed by patient: Thread/unthread right sleeve, Thread/unthread left sleeve, Button/unbutton shirt, Pull shirt around back      Upper body assist Assist Level: Supervision or verbal cues      Lower Body Dressing/Undressing Lower body dressing   What is the patient wearing?: Pants, Non-skid slipper socks     Pants- Performed by patient: Thread/unthread right pants leg, Thread/unthread left pants leg, Pull pants up/down, Fasten/unfasten pants Pants- Performed by helper: Thread/unthread left pants leg, Pull pants  up/down, Fasten/unfasten pants Non-skid slipper socks- Performed by patient: Don/doff right sock, Don/doff left sock                    Lower body assist Assist for lower body dressing: Touching or steadying assistance (Pt > 75%)      Toileting Toileting      Toileting steps completed by helper: Adjust clothing prior to toileting, Performs perineal hygiene, Adjust clothing after toileting    Toileting assist Assist level: Two helpers   Transfers Chair/bed transfer   Chair/bed transfer method: Ambulatory Chair/bed transfer assist level: Touching or steadying assistance (Pt > 75%) Chair/bed transfer assistive device: Armrests, Cane     Locomotion Ambulation     Max distance: 200' Assist level: Touching or steadying assistance (Pt > 75%)   Wheelchair   Type: Manual Max wheelchair distance: 200' Assist Level: Supervision or verbal cues  Cognition Comprehension Comprehension assist level: Follows complex conversation/direction with no assist  Expression Expression assist level: Expresses complex ideas: With no assist  Social Interaction Social Interaction assist level: Interacts appropriately with others - No medications needed.  Problem Solving Problem solving assist level: Solves complex problems: Recognizes & self-corrects  Memory Memory assist level: Complete Independence: No helper    Medical Problem List and Plan: 1.  Debility secondary to rhabdomyolysis related to fall/urosepsis as well as history of CVA with right-sided residual weakness.    Cont CIR 2.  DVT Prophylaxis/Anticoagulation: Subcutaneous Lovenox. Monitor platelet counts in any signs of bleeding 3. Pain Management: Tylenol as needed 4. Mood: Provide emotional support 5. Neuropsych: This patient is capable of making decisions on his own behalf. 6. Skin/Wound Care: Routine skin checks 7. Fluids/Electrolytes/Nutrition: Routine I&O's   BMP within acceptable range on 1/8, except for glucose 8. Hypertension.    Norvasc 5 mg daily, increased to 10 on 1/9   Lisinopril 2.5 started on 1/12   Lopressor 25 mg twice a day   Appears to be improving 9. History of urethral stricture multiple dilations. Follow-up urology services.  Rhabdomyolysis continue Flomax 0.8 mg daily.      Bethanechol started on 1/9, increased on 1/11, increased again on 1/12   D/ced foley    Pt voiding with retention   PVRs reordered 10. PAF. Cardiac rate controlled. Continue aspirin and Plavix 11. Diabetes mellitus with peripheral neuropathy. Hemoglobin A1c 7.0. Lantus insulin 12 units daily. Check blood sugars before meals and at bedtime. Diabetic teaching   Overall controlled on 1/13 12. ABLA  Hb 12.5 on 1/8   Labs ordered for tomorrow   Cont to monitor 13. Leukocytosis   WBCs 13.1 on 1/8   Labs ordered for tomorrow   Afebrile  UA +, Ucx enterococcus faecalis   Empiric Macrobid started on 1/11-1/17   Labs ordered for tomorrow 14. Hypoalbuminemia  Supplement initiated on 1/9  LOS (Days) 6 A FACE TO FACE EVALUATION WAS PERFORMED  Lisle Skillman Lorie Phenix 07/04/2017 8:49 AM

## 2017-07-04 NOTE — Progress Notes (Signed)
Physical Therapy Session Note  Patient Details  Name: [name] MRN: [id number] Date of Birth: [birth year]  Today's Date: 07/04/2017 PT Individual Time: 0801-0900 PT Individual Time Calculation (min): 59 min   Short Term Goals: Week 1:  PT Short Term Goal 1 (Week 1): Pt will perform sit<>stand transfer from low surface w/ min assist  PT Short Term Goal 2 (Week 1): Pt will ambulate 150' w/ LRAD w/ min assist  PT Short Term Goal 3 (Week 1): Pt will negotiate 4 steps w/ min assist  PT Short Term Goal 4 (Week 1): Pt will tolerate 30 min of OOB activity w/o increase in fatigue  Skilled Therapeutic Interventions/Progress Updates:    Pt sitting EOB upon PT arrival, agreeable to therapy tx and denies pain. Pt performed upper and lower body dressing from bedside working on seated and standing balance during dressing. Pt reported needing to go to the bathroom. While ambulating from bed>bathroom with SPC, pt incontinent of bowel. Pt performed multiple sit<>stands throughout session in order to clean lower body with washcloth, don clean brief and don clean clothes. Pt ambulated from bathroom>sink in order to wash hands, using SPC and min assist. Pt ambulated from sink>w/c with min assist using SPC. Pt left seated in w/c at end of session with needs in reach.   Therapy Documentation Precautions:  Precautions Precautions: Fall Restrictions Weight Bearing Restrictions: No   See Function Navigator for Current Functional Status.   Therapy/Group: Individual Therapy  Netta Corrigan, PT, DPT 07/04/2017, 7:49 AM

## 2017-07-05 ENCOUNTER — Inpatient Hospital Stay (HOSPITAL_COMMUNITY): Payer: Medicare Other

## 2017-07-05 ENCOUNTER — Inpatient Hospital Stay (HOSPITAL_COMMUNITY): Payer: Medicare Other | Admitting: Occupational Therapy

## 2017-07-05 DIAGNOSIS — R05 Cough: Secondary | ICD-10-CM

## 2017-07-05 DIAGNOSIS — R059 Cough, unspecified: Secondary | ICD-10-CM

## 2017-07-05 LAB — GLUCOSE, CAPILLARY
GLUCOSE-CAPILLARY: 113 mg/dL — AB (ref 65–99)
GLUCOSE-CAPILLARY: 110 mg/dL — AB (ref 65–99)
GLUCOSE-CAPILLARY: 183 mg/dL — AB (ref 65–99)
Glucose-Capillary: 91 mg/dL (ref 65–99)

## 2017-07-05 LAB — CBC WITH DIFFERENTIAL/PLATELET
BASOS ABS: 0 10*3/uL (ref 0.0–0.1)
BASOS PCT: 0 %
Eosinophils Absolute: 0.6 10*3/uL (ref 0.0–0.7)
Eosinophils Relative: 4 %
HEMATOCRIT: 40.3 % (ref 39.0–52.0)
HEMOGLOBIN: 13.5 g/dL (ref 13.0–17.0)
LYMPHS PCT: 10 %
Lymphs Abs: 1.5 10*3/uL (ref 0.7–4.0)
MCH: 30.3 pg (ref 26.0–34.0)
MCHC: 33.5 g/dL (ref 30.0–36.0)
MCV: 90.4 fL (ref 78.0–100.0)
MONO ABS: 1.2 10*3/uL — AB (ref 0.1–1.0)
Monocytes Relative: 8 %
NEUTROS ABS: 11.4 10*3/uL — AB (ref 1.7–7.7)
NEUTROS PCT: 78 %
Platelets: 225 10*3/uL (ref 150–400)
RBC: 4.46 MIL/uL (ref 4.22–5.81)
RDW: 13.1 % (ref 11.5–15.5)
WBC: 14.7 10*3/uL — AB (ref 4.0–10.5)

## 2017-07-05 LAB — CREATININE, SERUM: Creatinine, Ser: 0.67 mg/dL (ref 0.61–1.24)

## 2017-07-05 LAB — BASIC METABOLIC PANEL
ANION GAP: 9 (ref 5–15)
BUN: 23 mg/dL — ABNORMAL HIGH (ref 6–20)
CALCIUM: 8.7 mg/dL — AB (ref 8.9–10.3)
CO2: 26 mmol/L (ref 22–32)
Chloride: 102 mmol/L (ref 101–111)
Creatinine, Ser: 0.79 mg/dL (ref 0.61–1.24)
GLUCOSE: 177 mg/dL — AB (ref 65–99)
POTASSIUM: 3.9 mmol/L (ref 3.5–5.1)
Sodium: 137 mmol/L (ref 135–145)

## 2017-07-05 MED ORDER — LISINOPRIL 5 MG PO TABS
7.5000 mg | ORAL_TABLET | Freq: Every day | ORAL | Status: DC
  Administered 2017-07-06 – 2017-07-09 (×4): 7.5 mg via ORAL
  Filled 2017-07-05 (×4): qty 2

## 2017-07-05 MED ORDER — ALUM & MAG HYDROXIDE-SIMETH 200-200-20 MG/5ML PO SUSP
30.0000 mL | Freq: Four times a day (QID) | ORAL | Status: DC | PRN
  Administered 2017-07-05 – 2017-07-06 (×2): 30 mL via ORAL
  Filled 2017-07-05 (×2): qty 30

## 2017-07-05 NOTE — Progress Notes (Signed)
Occupational Therapy Weekly Progress Note  Patient Details  Name: [name] MRN: [id number] Date of Birth: 1941-01-13  Beginning of progress report period: June 29, 2017 End of progress report period: July 05, 2016  Today's Date: 07/05/2017 OT Individual Time: 1101-1200 OT Individual Time Calculation (min): 59 min    Patient has met 4 of 4 short term goals.  Pt continues to make steady progress with OT to a min guard assist level for most selfcare tasks and toileting using the RW for support.  Sit to stand had improved to a supervision to min guard assist level with use of UEs to assist.  He still demonstrates decreased static and dynamic standing balance, at times demonstrating decreased LOB posteriorly.  Feel this is likely due to the lack of sensation in his feet and his inability to make postural balance adjustments to prevent him from losing his balance.  Feel overall he is on target for supervision level goals with expected discharge later this week.  Will continue with current OT POC until discharge.   Patient continues to demonstrate the following deficits: muscle weakness and decreased standing balance and decreased balance strategies and therefore will continue to benefit from skilled OT intervention to enhance overall performance with BADL and Reduce care partner burden.  Patient progressing toward long term goals..  Continue plan of care.  OT Short Term Goals Week 2:  OT Short Term Goal 1 (Week 2): Continue working on current LTGs set at supervision level overall.    Skilled Therapeutic Interventions/Progress Updates:    Pt completed functional mobility from his room to the gym and back during session with min guard assist, using the RW for support.  He was able to work on standing balance in the gym while engaged in [place].  He was able to maintain static standing balance with supervision initially, but then needed min assist when standing on a foam surface  and completing the same task using BUEs.  He was able to then use the Nustep for 15 mins.  First set completed for 5 mins on level 5 resistance with steps maintained at 53 per minute.  Increased resistance for second 5 mins to level 6 with steps per minute at 47-48 per minute.  He completed the final set for 5 mins using just his LEs with steps per minute around 43.  Returned to room after brief rest break with pt left sitting on EOB in preparation for lunch.  Call button and phone in reach.  Safety belt also in place.     Therapy Documentation Precautions:  Precautions Precautions: Fall Restrictions Weight Bearing Restrictions: No  Pain: Pain Assessment Pain Assessment: 0-10 Pain Score: 1  Pain Type: Chronic pain Pain Location: Foot Pain Orientation: Right;Left Pain Intervention(s): Repositioned;Emotional support ADL: See Function Navigator for Current Functional Status.   Therapy/Group: Individual Therapy  Advika Mclelland OTR/L 07/05/2017, 12:19 PM

## 2017-07-05 NOTE — Progress Notes (Signed)
[place] PHYSICAL MEDICINE & REHABILITATION     PROGRESS NOTE  Subjective/Complaints:  Patient seen sitting up in her to the bed this morning. He states he slept well overnight, but this morning feels "sick, likely of a cold with productive sputum"  ROS: +Sick. Denies CP, SOB, N/V/D.  Objective: Vital Signs: Blood pressure (!) 147/50, pulse (!) 58, temperature 97.8 F (36.6 C), temperature source Oral, resp. rate 16, height 5\' 9"  (1.753 m), weight 88 kg (194 lb 0.1 oz), SpO2 98 %. No results found. No results for input(s): WBC, HGB, HCT, PLT in the last 72 hours. Recent Labs    07/05/17 0514  CREATININE 0.67   CBG (last 3)  Recent Labs    07/04/17 1651 07/04/17 2112 07/05/17 0641  GLUCAP 175* 210* 91    Wt Readings from Last 3 Encounters:  06/30/17 88 kg (194 lb 0.1 oz)  06/22/17 79.8 kg (176 lb)  09/23/15 87.2 kg (192 lb 4.8 oz)    Physical Exam:  BP (!) 147/50 (BP Location: Left Arm)   Pulse (!) 58   Temp 97.8 F (36.6 C) (Oral)   Resp 16   Ht 5\' 9"  (1.753 m)   Wt 88 kg (194 lb 0.1 oz)   SpO2 98%   BMI 28.65 kg/m  Gen: NAD. Vital signs reviewed.  HENT: Normocephalic. Atraumatic. Eyes:EOMare normal. No discharge.  Cardiovascular:RRR. No JVD. Respiratory:Effort normal and breath sounds normal.  GI: Bowel sounds are normal. He exhibitsno distension. Musculoskeletal: No edema. No tenderness in extremities. Neurological: He isalertand oriented Normal insight and awareness.  Motor:  Right lower extremity: 4/5 hip flexion, 4+/5 knee extension and ankle dorsiflexion/plantarflexion.  Left lower extremity: 4/5 hip flexion,4+/5 knee extension, 4+/5 ankle dorsiflexion and plantar flexion (stable). Sensation diminished to light touch b/l LE distal to calf (baseline) Skin. Warm and dry Psychiatric: Patient pleasant and appropriate.  Assessment/Plan: 1. Functional deficits secondary to fall/urosepsis as well as history of CVA with right-sided residual  weakness which require 3+ hours per day of interdisciplinary therapy in a comprehensive inpatient rehab setting. Physiatrist is providing close team supervision and 24 hour management of active medical problems listed below. Physiatrist and rehab team continue to assess barriers to discharge/monitor patient progress toward functional and medical goals.  Function:  Bathing Bathing position   Position: Wheelchair/chair at sink  Bathing parts Body parts bathed by patient: Right arm, Left arm, Chest, Abdomen, Right upper leg, Left upper leg, Right lower leg, Left lower leg, Front perineal area, Buttocks Body parts bathed by helper: Back  Bathing assist Assist Level: Touching or steadying assistance(Pt > 75%)      Upper Body Dressing/Undressing Upper body dressing   What is the patient wearing?: Pull over shirt/dress, Button up shirt     Pull over shirt/dress - Perfomed by patient: Thread/unthread right sleeve, Thread/unthread left sleeve, Put head through opening, Pull shirt over trunk   Button up shirt - Perfomed by patient: Thread/unthread right sleeve, Thread/unthread left sleeve, Button/unbutton shirt, Pull shirt around back      Upper body assist Assist Level: Supervision or verbal cues      Lower Body Dressing/Undressing Lower body dressing   What is the patient wearing?: Pants, Non-skid slipper socks     Pants- Performed by patient: Thread/unthread right pants leg, Thread/unthread left pants leg, Pull pants up/down, Fasten/unfasten pants Pants- Performed by helper: Thread/unthread left pants leg, Pull pants up/down, Fasten/unfasten pants Non-skid slipper socks- Performed by patient: Don/doff right sock, Don/doff left sock  Lower body assist Assist for lower body dressing: Touching or steadying assistance (Pt > 75%)      Toileting Toileting     Toileting steps completed by helper: Adjust clothing prior to toileting, Performs perineal hygiene,  Adjust clothing after toileting    Toileting assist Assist level: Touching or steadying assistance (Pt.75%)   Transfers Chair/bed transfer   Chair/bed transfer method: Ambulatory Chair/bed transfer assist level: Touching or steadying assistance (Pt > 75%) Chair/bed transfer assistive device: Armrests, Cane     Locomotion Ambulation     Max distance: 10 ft Assist level: Touching or steadying assistance (Pt > 75%)   Wheelchair   Type: Manual Max wheelchair distance: 200' Assist Level: Supervision or verbal cues  Cognition Comprehension Comprehension assist level: Follows complex conversation/direction with no assist  Expression Expression assist level: Expresses complex ideas: With no assist  Social Interaction Social Interaction assist level: Interacts appropriately with others - No medications needed.  Problem Solving Problem solving assist level: Solves complex problems: Recognizes & self-corrects  Memory Memory assist level: Complete Independence: No helper    Medical Problem List and Plan: 1.  Debility secondary to rhabdomyolysis related to fall/urosepsis as well as history of CVA with right-sided residual weakness.    Cont CIR 2.  DVT Prophylaxis/Anticoagulation: Subcutaneous Lovenox. Monitor platelet counts in any signs of bleeding 3. Pain Management: Tylenol as needed 4. Mood: Provide emotional support 5. Neuropsych: This patient is capable of making decisions on his own behalf. 6. Skin/Wound Care: Routine skin checks 7. Fluids/Electrolytes/Nutrition: Routine I&O's   BMP within acceptable range on 1/8, except for glucose, CR WNL 8. Hypertension.    Norvasc 5 mg daily, increased to 10 on 1/9   Lisinopril 2.5 started on 1/12, increased to 7.5 on 1/14   Lopressor 25 mg twice a day 9. History of urethral stricture multiple dilations. Follow-up urology services.  Rhabdomyolysis continue Flomax 0.8 mg daily.     Bethanechol started on 1/9, increased on 1/11, increased  again on 1/12   D/ced foley    Pt voiding with retention   PVRs overall improving 10. PAF. Cardiac rate controlled. Continue aspirin and Plavix 11. Diabetes mellitus with peripheral neuropathy. Hemoglobin A1c 7.0. Lantus insulin 12 units daily. Check blood sugars before meals and at bedtime. Diabetic teaching   Overall controlled on 1/14 12. ABLA  Hb 12.5 on 1/8   Labs pending   Cont to monitor 13. Leukocytosis   WBCs 13.1 on 1/8   Labs pending   Afebrile  UA +, Ucx enterococcus faecalis   Empiric Macrobid started on 1/11-1/17 14. Hypoalbuminemia  Supplement initiated on 1/9 15. Productive cough   CXR ordered 1/14  LOS (Days) 7 A FACE TO FACE EVALUATION WAS PERFORMED  [name] Lorie Phenix 07/05/2017 8:27 AM

## 2017-07-05 NOTE — Plan of Care (Signed)
  Progressing Consults [facility] GENERAL PATIENT EDUCATION Description See Patient Education module for education specifics. 07/05/2017 1240 - Progressing by Brita Romp, RN Skin Care Protocol Initiated - if Braden Score 18 or less Description If consults are not indicated, leave blank or document N/A 07/05/2017 1240 - Progressing by Brita Romp, RN Diabetes Guidelines if Diabetic/Glucose > 140 Description If diabetic or lab glucose is > 140 mg/dl - Initiate Diabetes/Hyperglycemia Guidelines & Document Interventions  07/05/2017 1240 - Progressing by Brita Romp, RN RH BLADDER ELIMINATION RH STG MANAGE BLADDER WITH EQUIPMENT WITH ASSISTANCE Description STG Manage Bladder With Equipment With max Assistance  07/05/2017 1240 - Progressing by Brita Romp, RN RH SKIN INTEGRITY RH STG SKIN FREE OF INFECTION/BREAKDOWN Description Patients skin will remain free from further infection or breakdown with min assist.  07/05/2017 1240 - Progressing by Brita Romp, RN RH STG MAINTAIN SKIN INTEGRITY WITH ASSISTANCE Description STG Maintain Skin Integrity With min Assistance.  07/05/2017 1240 - Progressing by Brita Romp, RN RH SAFETY RH STG ADHERE TO SAFETY PRECAUTIONS W/ASSISTANCE/DEVICE Description STG Adhere to Safety Precautions With mod I Assistance/Device.  07/05/2017 1240 - Progressing by Brita Romp, RN RH PAIN MANAGEMENT RH STG PAIN MANAGED AT OR BELOW PT'S PAIN GOAL Description < 3  07/05/2017 1240 - Progressing by Brita Romp, RN

## 2017-07-05 NOTE — Progress Notes (Signed)
Physical Therapy Session Note  Patient Details  Name: [name] MRN: [id number] Date of Birth: 29-Sep-1940  Today's Date: 07/05/2017 PT Individual Time: 0800-0900, 9622-2979 PT Individual Time Calculation (min): 60 min , 62 min   Short Term Goals: Week 1:  PT Short Term Goal 1 (Week 1): Pt will perform sit<>stand transfer from low surface w/ min assist  PT Short Term Goal 2 (Week 1): Pt will ambulate 150' w/ LRAD w/ min assist  PT Short Term Goal 3 (Week 1): Pt will negotiate 4 steps w/ min assist  PT Short Term Goal 4 (Week 1): Pt will tolerate 30 min of OOB activity w/o increase in fatigue  Skilled Therapeutic Interventions/Progress Updates:    Session 1: Pt seated EOB upon PT arrival, agreeable to therapy tx and denies pain. Pt seated EOB performed upper and lower body dressing with set up assist, performed sit<>stand to pull pants over hips. Pt ambulated from room<>gym x 150 ft each direction using SPC with min assist. Pt performed LE therex for strengthening, 2 x 10 each: standing hamstring curls, calf raises, hip abduction, hip extension and marches; seated hip abduction orange TB, plantar flexion with orange TB, LAQ with 2# and seated marches.Pt performed seated therex for UE strength with orange therabang, 2 x 10 each: shoulder abduction, diagonals and rows. Pt ambulated back to room using SPC and min assist, left seated in w/c with needs in reach.   Session 2: Pt supine in bed upon PT arrival, agreeable to therapy tx and denies pain. Pt ambulated from room<>gym x 150 ft each direction with RW and with supervision. Pt worked on standing balance to complete dog puzzle while standing on wedge without UE support. Pt used cybex kinetron on 15 cm/sec x 1 trial sitting and x 2 trials in standing for LE strengthening and endurance. Pt ambulated x 80 ft with RW and supervision working on changes in speed and obstacle navigation. Pt worked on LE strengthening to perform sit<>stands from  elevated mat height without UE support, verbal cues for eccentric control. Pt ambulated back to room and left seated in w/c with needs in reach.    Therapy Documentation Precautions:  Precautions Precautions: Fall Restrictions Weight Bearing Restrictions: No   See Function Navigator for Current Functional Status.   Therapy/Group: Individual Therapy  Netta Corrigan, PT, DPT 07/05/2017, 7:56 AM

## 2017-07-06 ENCOUNTER — Inpatient Hospital Stay (HOSPITAL_COMMUNITY): Payer: Medicare Other

## 2017-07-06 ENCOUNTER — Inpatient Hospital Stay (HOSPITAL_COMMUNITY): Payer: Medicare Other | Admitting: Physical Therapy

## 2017-07-06 ENCOUNTER — Inpatient Hospital Stay (HOSPITAL_COMMUNITY): Payer: Medicare Other | Admitting: Occupational Therapy

## 2017-07-06 DIAGNOSIS — J029 Acute pharyngitis, unspecified: Secondary | ICD-10-CM

## 2017-07-06 LAB — GLUCOSE, CAPILLARY
GLUCOSE-CAPILLARY: 294 mg/dL — AB (ref 65–99)
Glucose-Capillary: 134 mg/dL — ABNORMAL HIGH (ref 65–99)

## 2017-07-06 MED ORDER — MENTHOL 3 MG MT LOZG
1.0000 | LOZENGE | OROMUCOSAL | Status: DC | PRN
  Administered 2017-07-06: 3 mg via ORAL
  Filled 2017-07-06 (×3): qty 9

## 2017-07-06 NOTE — Progress Notes (Signed)
Occupational Therapy Session Note  Patient Details  Name: [name] MRN: [id number] Date of Birth: Jan 07, 1941  Today's Date: 07/06/2017 OT Individual Time: 1300-1401 OT Individual Time Calculation (min): 61 min    Short Term Goals: Week 2:  OT Short Term Goal 1 (Week 2): Continue working on current LTGs set at supervision level overall.    Skilled Therapeutic Interventions/Progress Updates:    Pt worked on standing at the sink to complete shaving task.  During activity he immediately stated he had to go to the bathroom and then completed transfer to the toilet with rails using the RW.  Slight bowel incontinence noted in the brief but pt still able to have a significant bowel movement.  Supervision for sit to stand and for performing peri cleaning.  Min assist for balance while attempting to fasten pants and tuck in shirt tail.   He then ambulated back to the sink to wash his hands and complete shaving.  Supervision for completion of both tasks with transition back to the bedside recliner to complete session.  Pt was left in bedside recliner with call button and phone in reach.  Pt's daughter in for session as well and observer therapy.  Discussed recommendations for supervision with transfers and toileting and the need for a BSC at the bedside at night secondary to bowel urgency/incontinence.    Therapy Documentation Precautions:  Precautions Precautions: Fall Restrictions Weight Bearing Restrictions: No  Pain: Pain Assessment Pain Assessment: No/denies pain ADL: See Function Navigator for Current Functional Status.   Therapy/Group: Individual Therapy  Shekela Goodridge OTR/L 07/06/2017, 3:51 PM

## 2017-07-06 NOTE — Progress Notes (Signed)
Physical Therapy Session Note  Patient Details  Name: [name] MRN: [id number] Date of Birth: 1941/04/21  Today's Date: 07/06/2017 PT Individual Time: 1000-1058, 7517-0017 PT Individual Time Calculation (min): 58 min , 45 min   Short Term Goals: Week 1:  PT Short Term Goal 1 (Week 1): Pt will perform sit<>stand transfer from low surface w/ min assist  PT Short Term Goal 2 (Week 1): Pt will ambulate 150' w/ LRAD w/ min assist  PT Short Term Goal 3 (Week 1): Pt will negotiate 4 steps w/ min assist  PT Short Term Goal 4 (Week 1): Pt will tolerate 30 min of OOB activity w/o increase in fatigue  Skilled Therapeutic Interventions/Progress Updates:   Session 1:  Pt supine in bed upon PT arrival, agreeable to therapy tx and denies pain. Pt reports being incontinent of bladder, and that he will likely need to have a BM soon. Therapist suggested getting on the toilet before heading to the gym and pt reports "I do not want to sit on that toilet, it is uncomfortable. They can change me in bed." Therapist explained the importance of using the toilet as he will do at home, pt agreeable. Pt ambulated from bed>bathroom with supervision and walker. Pt continent of bowel after therapist encouraged sitting on the toilet, pt performed sit<>stands with min assist using grab bars for clothing management and to clean peri area. Pt sat in w/c while washing hands and completed upper/lower body dressing with supervision. Pt ambulated from room<>gym x 200 ft each way with SPC, therapist explained that RW or rollator will be the safest option at home and the pt will work towards using [facility] in therapy only. Pt worked on dynamic standing balance on foam to play horse shoes and to perform toe taps without UE support. Pt left seated in w/c at end of session with needs in reach.   Session 2: Pt seated in recliner upon PT arrival, agreeable to therapy tx and denies pain. Pt ambulated from room<>gym x 200 ft each direction  using a rollator with supervision. Pt worked on LE strengthening to perform 2 x 5 sit<>stands from elevated mat without UE support. Pt used nustep x 8 minutes on level 6 working on LE strengthening and endurance. Pt worked on dynamic standing balance while navigating around obstacles, stepping over objects and gait with head turns all while using rollator for UE support. Pt worked on standing balance while playing a game of checkers. Pt ambulated back to room with rollator, left seated EOB with needs in reach.   Therapy Documentation Precautions:  Precautions Precautions: Fall Restrictions Weight Bearing Restrictions: No   See Function Navigator for Current Functional Status.   Therapy/Group: Individual Therapy  Netta Corrigan, PT, DPT 07/06/2017, 10:20 AM

## 2017-07-06 NOTE — Progress Notes (Signed)
[place] PHYSICAL MEDICINE & REHABILITATION     PROGRESS NOTE  Subjective/Complaints:  Pt seen lying in bed this AM.  He states he slept well overnight.  He complains of a sore throay and asks for lozenges.   ROS: +Sore throat. Denies CP, SOB, N/V/D.  Objective: Vital Signs: Blood pressure (!) 128/49, pulse 74, temperature 98.8 F (37.1 C), temperature source Oral, resp. rate 16, height 5\' 9"  (1.753 m), weight 88 kg (194 lb 0.1 oz), SpO2 95 %. Dg Chest 2 View  Result Date: 07/05/2017 CLINICAL DATA:  Productive cough.  Wheezing. EXAM: CHEST  2 VIEW COMPARISON:  06/23/2017 and 09/23/2015 FINDINGS: The heart size and pulmonary vascularity are normal. Minimal linear scarring in the lingula, unchanged. Slight blunting of posterior costophrenic angles consistent with tiny effusions. Bones are normal. Aortic atherosclerosis. IMPRESSION: 1. Tiny bilateral pleural effusions. 2. No other acute abnormalities. 3. Aortic atherosclerosis. Electronically Signed   By: Lorriane Shire M.D.   On: 07/05/2017 13:14   Recent Labs    07/05/17 0910  WBC 14.7*  HGB 13.5  HCT 40.3  PLT 225   Recent Labs    07/05/17 0514 07/05/17 0910  NA  --  137  K  --  3.9  CL  --  102  GLUCOSE  --  177*  BUN  --  23*  CREATININE 0.67 0.79  CALCIUM  --  8.7*   CBG (last 3)  Recent Labs    07/05/17 1640 07/05/17 1947 07/06/17 0650  GLUCAP 110* 183* 134*    Wt Readings from Last 3 Encounters:  06/30/17 88 kg (194 lb 0.1 oz)  06/22/17 79.8 kg (176 lb)  09/23/15 87.2 kg (192 lb 4.8 oz)    Physical Exam:  BP (!) 128/49   Pulse 74   Temp 98.8 F (37.1 C) (Oral)   Resp 16   Ht 5\' 9"  (1.753 m)   Wt 88 kg (194 lb 0.1 oz)   SpO2 95%   BMI 28.65 kg/m  Gen: NAD. Vital signs reviewed.  HENT: Normocephalic. Atraumatic. +Hoarse voice.  Eyes:EOMare normal. No discharge.  Cardiovascular:RRR. No JVD. Respiratory:Effort normal and breath sounds normal.  GI: Bowel sounds are normal. He exhibitsno  distension. Musculoskeletal: No edema. No tenderness in extremities. Neurological: He isalertand oriented Normal insight and awareness.  Motor:  Right lower extremity: 4-/5 hip flexion, 4+/5 knee extension and ankle dorsiflexion/plantarflexion.  Left lower extremity: 4/5 hip flexion,4+/5 knee extension, 4+/5 ankle dorsiflexion and plantar flexion (stable). Sensation diminished to light touch b/l LE distal to calf (baseline) Skin. Warm and dry Psychiatric: Patient pleasant and appropriate.  Assessment/Plan: 1. Functional deficits secondary to fall/urosepsis as well as history of CVA with right-sided residual weakness which require 3+ hours per day of interdisciplinary therapy in a comprehensive inpatient rehab setting. Physiatrist is providing close team supervision and 24 hour management of active medical problems listed below. Physiatrist and rehab team continue to assess barriers to discharge/monitor patient progress toward functional and medical goals.  Function:  Bathing Bathing position   Position: Wheelchair/chair at sink  Bathing parts Body parts bathed by patient: Right arm, Left arm, Chest, Abdomen, Right upper leg, Left upper leg, Right lower leg, Left lower leg, Front perineal area, Buttocks Body parts bathed by helper: Back  Bathing assist Assist Level: Touching or steadying assistance(Pt > 75%)      Upper Body Dressing/Undressing Upper body dressing   What is the patient wearing?: Pull over shirt/dress, Button up shirt  Pull over shirt/dress - Perfomed by patient: Thread/unthread right sleeve, Thread/unthread left sleeve, Put head through opening, Pull shirt over trunk   Button up shirt - Perfomed by patient: Thread/unthread right sleeve, Thread/unthread left sleeve, Button/unbutton shirt, Pull shirt around back      Upper body assist Assist Level: Supervision or verbal cues      Lower Body Dressing/Undressing Lower body dressing   What is the patient  wearing?: Pants, Non-skid slipper socks     Pants- Performed by patient: Thread/unthread right pants leg, Thread/unthread left pants leg, Pull pants up/down, Fasten/unfasten pants Pants- Performed by helper: Thread/unthread left pants leg, Pull pants up/down, Fasten/unfasten pants Non-skid slipper socks- Performed by patient: Don/doff right sock, Don/doff left sock                    Lower body assist Assist for lower body dressing: Touching or steadying assistance (Pt > 75%)      Toileting Toileting     Toileting steps completed by helper: Adjust clothing prior to toileting, Performs perineal hygiene, Adjust clothing after toileting    Toileting assist Assist level: Touching or steadying assistance (Pt.75%)   Transfers Chair/bed transfer   Chair/bed transfer method: Ambulatory Chair/bed transfer assist level: Touching or steadying assistance (Pt > 75%) Chair/bed transfer assistive device: Armrests, Medical sales representative     Max distance: 150 ft Assist level: Touching or steadying assistance (Pt > 75%)   Wheelchair   Type: Manual Max wheelchair distance: 200' Assist Level: Supervision or verbal cues  Cognition Comprehension Comprehension assist level: Follows basic conversation/direction with no assist  Expression Expression assist level: Expresses basic needs/ideas: With no assist  Social Interaction Social Interaction assist level: Interacts appropriately 90% of the time - Needs monitoring or encouragement for participation or interaction.  Problem Solving Problem solving assist level: Solves basic problems with no assist  Memory Memory assist level: Recognizes or recalls 90% of the time/requires cueing < 10% of the time    Medical Problem List and Plan: 1.  Debility secondary to rhabdomyolysis related to fall/urosepsis as well as history of CVA with right-sided residual weakness.    Cont CIR 2.  DVT Prophylaxis/Anticoagulation: Subcutaneous Lovenox.  Monitor platelet counts in any signs of bleeding 3. Pain Management: Tylenol as needed 4. Mood: Provide emotional support 5. Neuropsych: This patient is capable of making decisions on his own behalf. 6. Skin/Wound Care: Routine skin checks 7. Fluids/Electrolytes/Nutrition: Routine I&O's   BMP within acceptable range on 1/14, except for glucose 8. Hypertension.    Norvasc 5 mg daily, increased to 10 on 1/9   Lisinopril 2.5 started on 1/12, increased to 7.5 on 1/14   Lopressor 25 mg twice a day   Improving 9. History of urethral stricture multiple dilations. Follow-up urology services.  Rhabdomyolysis continue Flomax 0.8 mg daily.     Bethanechol started on 1/9, increased on 1/11, increased again on 1/12   D/ced foley    Pt voiding with retention   PVRs overall improving, not performed recently, will speak with nursing 10. PAF. Cardiac rate controlled. Continue aspirin and Plavix 11. Diabetes mellitus with peripheral neuropathy. Hemoglobin A1c 7.0. Lantus insulin 12 units daily. Check blood sugars before meals and at bedtime. Diabetic teaching   Overall controlled on 1/15 12. ABLA: Resolved  Hb 13.5 on 1/14   Cont to monitor 13. Acute lower UTI   WBCs 14.7 on 1/14   Afebrile  UA +, Ucx enterococcus faecalis  Empiric Macrobid started on 1/11-1/17 14. Hypoalbuminemia  Supplement initiated on 1/9 15. Productive cough   CXR 1/14 reviewed, unremarkable for acute process   Improving 16. Sore throat  Lozenges ordered on 1/14  LOS (Days) 8 A FACE TO FACE EVALUATION WAS PERFORMED  Claude Waldman Lorie Phenix 07/06/2017 8:45 AM

## 2017-07-07 ENCOUNTER — Inpatient Hospital Stay (HOSPITAL_COMMUNITY): Payer: Medicare Other

## 2017-07-07 ENCOUNTER — Inpatient Hospital Stay (HOSPITAL_COMMUNITY): Payer: Medicare Other | Admitting: Occupational Therapy

## 2017-07-07 DIAGNOSIS — J019 Acute sinusitis, unspecified: Secondary | ICD-10-CM

## 2017-07-07 LAB — GLUCOSE, CAPILLARY
GLUCOSE-CAPILLARY: 137 mg/dL — AB (ref 65–99)
GLUCOSE-CAPILLARY: 244 mg/dL — AB (ref 65–99)
Glucose-Capillary: 104 mg/dL — ABNORMAL HIGH (ref 65–99)
Glucose-Capillary: 240 mg/dL — ABNORMAL HIGH (ref 65–99)

## 2017-07-07 NOTE — Patient Care Conference (Signed)
Inpatient RehabilitationTeam Conference and Plan of Care Update Date: 07/07/2017   Time: 2:20 PM    Patient Name: [name]      Medical Record Number: [id number]  Date of Birth: 25-Oct-1940 Sex: Male         Room/Bed: 4M10C/4M10C-01 Payor Info: Payor: Marine scientist / Plan: [employer] MEDICARE / Product Type: *No Product type* /    Admitting Diagnosis: Debility  Admit Date/Time:  06/28/2017  4:41 PM Admission Comments: No comment available   Primary Diagnosis:  <principal problem not specified> Principal Problem: <principal problem not specified>  Patient Active Problem List   Diagnosis Date Noted  . Acute non-recurrent sinusitis   . Sore throat   . Cough   . Overflow incontinence of urine   . Urinary retention   . Acute lower UTI   . Urinary urgency   . Hypoalbuminemia due to protein-calorie malnutrition ([place])   . 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 Benign essential HTN   . H/O urethral stricture   . Acute blood loss anemia   . Poorly controlled type 2 diabetes mellitus with peripheral neuropathy ([place])   . Debility 06/28/2017  . Personal history of urethral stricture 06/23/2017  . Rhabdomyolysis 06/23/2017  . Fever 09/23/2015  . SIRS (systemic inflammatory response syndrome) ([place]) 09/23/2015  . Near syncope 09/23/2015  . PAF (paroxysmal atrial fibrillation) ([place]) 09/23/2015  . Diabetes mellitus type 2, uncontrolled ([place]) 09/23/2015  . 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 Vertebrobasilar artery syndrome   . Orthostatic hypotension   . Insulin dependent diabetes mellitus ([place])   . Falls frequently   . Weakness 08/15/2015  . Cellulitis of right upper arm 06/02/2014  . Blood poisoning   . Encephalopathy 05/29/2014  . Sepsis ([place]) 05/29/2014  . UTI (lower urinary tract infection) 09/16/2013  . Recurrent falls 09/16/2013  . History of CVA (cerebrovascular accident) 09/16/2013  . Leukocytosis 09/16/2013  . Thrombocytopenia ([place]) 09/16/2013  .  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 Diabetes mellitus ([place]) 10/09/2011  . Hypertension 10/09/2011  . A-fib [facility])     Expected Discharge Date: Expected Discharge Date: 07/09/17  Team Members Present: Physician leading conference: Dr. Delice Lesch Social Worker Present: Ovidio Kin, LCSW Nurse Present: Dorien Chihuahua, RN PT Present: Michaelene Song, PT OT Present: Clyda Greener, OT SLP Present: Windell Moulding, SLP PPS Coordinator present : Daiva Nakayama, RN, CRRN     Current Status/Progress Goal Weekly Team Focus  Medical   Debility secondary to rhabdomyolysis related to fall/urosepsis as well as history of CVA with right-sided residual weakness.   Improve mobility, self-care, BP, DM, UTI  See above   Bowel/Bladder   incontinent of b/b  regain continence of b/b with min assist  toilet program encourage pt to allow I/O as needed   Swallow/Nutrition/ Hydration             ADL's   Pt completes UB bathing and dressing with supervision, LB bathing and dressing with supervision as well.   Decreased dynamic standing balance   supervision level overall  selfcare retraining, transfer training, balance retraining, pt/family education, neuromuscular re-education   Mobility   supervision with RW, min assist with SPC, supervision bed mobility and transfers, min assist steps with handrail  supervision overall, household ambulator  function LE strengthening, gait, endurance, awareness, balance, pt education   Communication             Safety/Cognition/ Behavioral Observations  Pain   denies any pain at present   pain <=2/10  assess qshift and prn   Skin   MASD on buttocks and scrotum Blister left buttocks abrasion on left toes   improving skin condition no further breakdown  assess skin qshift and prn      *See Care Plan and progress notes for long and short-term goals.     Barriers to Discharge  Current Status/Progress Possible Resolutions Date Resolved   Physician    Medical  stability     See above  Therapies, optimize BP/DM meds, follow labs, abx for UTI, meds for retention      Nursing                  PT                    OT                  SLP                SW                Discharge Planning/Teaching Needs:  Going to daughter's home until he doesn't require supervision and can return to his own home.      Team Discussion:  Progressing toward his goals of supervision level. Voiding now but RN still monitoring. Pt refuses I & O caths, feels not necessary. Unsteady at times pt just dismisses this. Will try to get daughter in for education prior to discharge home on Friday. Pt has his own way of doing things, safer when uses rollator rolling walker. Medically stable for discharge Friday  Revisions to Treatment Plan:  DC 1/18    Continued Need for Acute Rehabilitation Level of Care: The patient requires daily medical management by a physician with specialized training in physical medicine and rehabilitation for the following conditions: Daily direction of a multidisciplinary physical rehabilitation program to ensure safe treatment while eliciting the highest outcome that is of practical value to the patient.: Yes Daily medical management of patient stability for increased activity during participation in an intensive rehabilitation regime.: Yes Daily analysis of laboratory values and/or radiology reports with any subsequent need for medication adjustment of medical intervention for : Blood pressure problems;Urological problems;Other;Diabetes problems  Elease Hashimoto 07/07/2017, 2:36 PM

## 2017-07-07 NOTE — Progress Notes (Signed)
Occupational Therapy Session Note  Patient Details  Name: [name] MRN: [id number] Date of Birth: 12-29-1940  Today's Date: 07/07/2017 OT Individual Time: 1305-1330 OT Individual Time Calculation (min): 25 min   Short Term Goals: Week 2:  OT Short Term Goal 1 (Week 2): Continue working on current LTGs set at supervision level overall.    Skilled Therapeutic Interventions/Progress Updates:    OT treatment session focused on UB there-ex. Using level 2 orange thera-band, pt completed 3 sets of 15 bicep curl and triceps press. Pt then utilized 2lb weighted bar for ball toss/hit  15 x3. Pt left seated in wc at end of session with needs met.  Therapy Documentation Precautions:  Precautions Precautions: Fall Restrictions Weight Bearing Restrictions: No Pain: Pain Assessment Pain Assessment: No/denies pain  See Function Navigator for Current Functional Status.  Therapy/Group: Individual Therapy  Valma Cava 07/07/2017, 1:36 PM

## 2017-07-07 NOTE — Progress Notes (Signed)
Social Work Patient ID: [name], male   DOB: 1941-04-17, 77 y.o.   MRN: [id number]  Met with pt to discuss team conference progression toward his supervision level goals and on target for Friday discharge. He is going to his daughter's home until he is able to return to his own. She can provide supervision level to him, but no physical assist. He has all of his equipment and requests [facility] for follow up since he has had them before. Will make referral and work on discharge for Friday.

## 2017-07-07 NOTE — Progress Notes (Signed)
Occupational Therapy Note  Patient Details  Name: [name] MRN: [id number] Date of Birth: [birth year]  Today's Date: 07/06/2017 OT Individual Time:  -  11:30-12:00  1:1 no c/o pain in session Therapeutic activity with focus on functional ambulation with SPC v Rollator.  Pt reports he is still going to use the cane at home despite recommendations from team to use RW v Rollator for bilateral UE support. Pt is min A with SPC and close supervision with bilateral UE support on a AD during functional tasks. Pt also education and demonstrated safe floor transfers with instructional  cues and close supervision with extra time. Ambulated back to room with Rollator with close supervision.       Willeen Cass [facility] 07/07/2017, 7:31 AM

## 2017-07-07 NOTE — Progress Notes (Signed)
Occupational Therapy Session Note  Patient Details  Name: [name] MRN: [id number] Date of Birth: 1940/11/19  Today's Date: 07/07/2017 OT Individual Time: 7342-8768 OT Individual Time Calculation (min): 26 min    Short Term Goals: Week 2:  OT Short Term Goal 1 (Week 2): Continue working on current LTGs set at supervision level overall.    Skilled Therapeutic Interventions/Progress Updates:    Pt completed functional mobility from the therapy gym to the ADL apartment with supervision using the RW.  Once in the ADL apartment he was able to complete transfer to the lower couch as well as the bed with supervision and mod instructional cueing.  He tends to leave the rollator over to the side and then turn to where he is going to sit, instead of backing all the way up to the surface he is going to sit on.  He finished session with ambulation back to the room where he was left sitting on the EOB with bed alarm on and call button and phone in reach.    Therapy Documentation Precautions:  Precautions Precautions: Fall Restrictions Weight Bearing Restrictions: No  Pain: Slight back pain reported at 4/10 sitting on the therapy mat Changed positions with pt reporting relief.    See Function Navigator for Current Functional Status.   Therapy/Group: Individual Therapy  Loella Hickle OTR/L 07/07/2017, 4:03 PM

## 2017-07-07 NOTE — Progress Notes (Signed)
[place] PHYSICAL MEDICINE & REHABILITATION     PROGRESS NOTE  Subjective/Complaints:  Pt seen laying in bed this AM.  He slept fairly overnight, but woke up due to incontinence.  He is upset about taking a long time to get Maalox yesterday.  He also notes transient hearing difficulty, which he states happens to him when he gets a cold.  ROS: +Sore throat. Denies CP, SOB, N/V/D.  Objective: Vital Signs: Blood pressure 140/66, pulse 61, temperature 98.1 F (36.7 C), temperature source Oral, resp. rate 16, height 5\' 9"  (1.753 m), weight 79.2 kg (174 lb 9.7 oz), SpO2 99 %. Dg Chest 2 View  Result Date: 07/05/2017 CLINICAL DATA:  Productive cough.  Wheezing. EXAM: CHEST  2 VIEW COMPARISON:  06/23/2017 and 09/23/2015 FINDINGS: The heart size and pulmonary vascularity are normal. Minimal linear scarring in the lingula, unchanged. Slight blunting of posterior costophrenic angles consistent with tiny effusions. Bones are normal. Aortic atherosclerosis. IMPRESSION: 1. Tiny bilateral pleural effusions. 2. No other acute abnormalities. 3. Aortic atherosclerosis. Electronically Signed   By: Lorriane Shire M.D.   On: 07/05/2017 13:14   Recent Labs    07/05/17 0910  WBC 14.7*  HGB 13.5  HCT 40.3  PLT 225   Recent Labs    07/05/17 0514 07/05/17 0910  NA  --  137  K  --  3.9  CL  --  102  GLUCOSE  --  177*  BUN  --  23*  CREATININE 0.67 0.79  CALCIUM  --  8.7*   CBG (last 3)  Recent Labs    07/06/17 0650 07/06/17 2213 07/07/17 0608  GLUCAP 134* 294* 104*    Wt Readings from Last 3 Encounters:  07/07/17 79.2 kg (174 lb 9.7 oz)  06/22/17 79.8 kg (176 lb)  09/23/15 87.2 kg (192 lb 4.8 oz)    Physical Exam:  BP 140/66 (BP Location: Left Arm)   Pulse 61   Temp 98.1 F (36.7 C) (Oral)   Resp 16   Ht 5\' 9"  (1.753 m)   Wt 79.2 kg (174 lb 9.7 oz)   SpO2 99%   BMI 25.78 kg/m  Gen: NAD. Vital signs reviewed.  HENT: Normocephalic. Atraumatic. +Hoarse voice.  Eyes:EOMare  normal. No discharge.  Cardiovascular:RRR. No JVD. Respiratory:Effort normal and breath sounds normal.  GI: Bowel sounds are normal. He exhibitsno distension. Musculoskeletal: No edema. No tenderness in extremities. Neurological: He isalertand oriented Normal insight and awareness.  Motor:  Right lower extremity: 4-/5 hip flexion, 4+/5 knee extension and ankle dorsiflexion/plantarflexion.  Left lower extremity: 4/5 hip flexion,4+/5 knee extension, 4+/5 ankle dorsiflexion and plantar flexion (unchanged). Sensation diminished to light touch b/l LE distal to calf (baseline) Skin. Warm and dry Psychiatric: Patient pleasant and appropriate.  Assessment/Plan: 1. Functional deficits secondary to fall/urosepsis as well as history of CVA with right-sided residual weakness which require 3+ hours per day of interdisciplinary therapy in a comprehensive inpatient rehab setting. Physiatrist is providing close team supervision and 24 hour management of active medical problems listed below. Physiatrist and rehab team continue to assess barriers to discharge/monitor patient progress toward functional and medical goals.  Function:  Bathing Bathing position   Position: Wheelchair/chair at sink  Bathing parts Body parts bathed by patient: Right arm, Left arm, Chest, Abdomen, Right upper leg, Left upper leg, Right lower leg, Left lower leg, Front perineal area, Buttocks Body parts bathed by helper: Back  Bathing assist Assist Level: Touching or steadying assistance(Pt > 75%)  Upper Body Dressing/Undressing Upper body dressing   What is the patient wearing?: Pull over shirt/dress, Button up shirt     Pull over shirt/dress - Perfomed by patient: Thread/unthread right sleeve, Thread/unthread left sleeve, Put head through opening, Pull shirt over trunk   Button up shirt - Perfomed by patient: Thread/unthread right sleeve, Thread/unthread left sleeve, Button/unbutton shirt, Pull shirt around  back      Upper body assist Assist Level: Supervision or verbal cues      Lower Body Dressing/Undressing Lower body dressing   What is the patient wearing?: Pants, Non-skid slipper socks     Pants- Performed by patient: Thread/unthread right pants leg, Thread/unthread left pants leg, Pull pants up/down, Fasten/unfasten pants Pants- Performed by helper: Thread/unthread left pants leg, Pull pants up/down, Fasten/unfasten pants Non-skid slipper socks- Performed by patient: Don/doff right sock, Don/doff left sock                    Lower body assist Assist for lower body dressing: Touching or steadying assistance (Pt > 75%)      Toileting Toileting     Toileting steps completed by helper: Adjust clothing prior to toileting, Performs perineal hygiene, Adjust clothing after toileting    Toileting assist Assist level: Supervision or verbal cues   Transfers Chair/bed transfer   Chair/bed transfer method: Ambulatory Chair/bed transfer assist level: Touching or steadying assistance (Pt > 75%) Chair/bed transfer assistive device: Armrests, Cane     Locomotion Ambulation     Max distance: 150 ft Assist level: Touching or steadying assistance (Pt > 75%)   Wheelchair   Type: Manual Max wheelchair distance: 200' Assist Level: Supervision or verbal cues  Cognition Comprehension Comprehension assist level: Follows basic conversation/direction with no assist  Expression Expression assist level: Expresses basic needs/ideas: With no assist  Social Interaction Social Interaction assist level: Interacts appropriately 90% of the time - Needs monitoring or encouragement for participation or interaction.  Problem Solving Problem solving assist level: Solves basic problems with no assist  Memory Memory assist level: Recognizes or recalls 90% of the time/requires cueing < 10% of the time    Medical Problem List and Plan: 1.  Debility secondary to rhabdomyolysis related to  fall/urosepsis as well as history of CVA with right-sided residual weakness.    Cont CIR 2.  DVT Prophylaxis/Anticoagulation: Subcutaneous Lovenox. Monitor platelet counts in any signs of bleeding 3. Pain Management: Tylenol as needed 4. Mood: Provide emotional support 5. Neuropsych: This patient is capable of making decisions on his own behalf. 6. Skin/Wound Care: Routine skin checks 7. Fluids/Electrolytes/Nutrition: Routine I&O's   BMP within acceptable range on 1/14, except for glucose 8. Hypertension.    Norvasc 5 mg daily, increased to 10 on 1/9   Lisinopril 2.5 started on 1/12, increased to 7.5 on 1/14   Lopressor 25 mg twice a day   Overall controlled on 1/16 9. History of urethral stricture multiple dilations. Follow-up urology services.  Rhabdomyolysis continue Flomax 0.8 mg daily.     Bethanechol started on 1/9, increased on 1/11, increased again on 1/12   D/ced foley    Pt voiding with retention   PVRs overall improving, will speak with nursing 10. PAF. Cardiac rate controlled. Continue aspirin and Plavix 11. Diabetes mellitus with peripheral neuropathy. Hemoglobin A1c 7.0. Lantus insulin 12 units daily. Check blood sugars before meals and at bedtime. Diabetic teaching   1 elevation yesterday, otherwise relatively controlled 1/16 12. ABLA: Resolved  Hb 13.5 on 1/14  Cont to monitor 13. Acute lower UTI   WBCs 14.7 on 1/14   Afebrile  UA +, Ucx enterococcus faecalis   Empiric Macrobid started on 1/11-1/17 14. Hypoalbuminemia  Supplement initiated on 1/9 15. Productive cough   CXR 1/14 reviewed, unremarkable for acute process   Improving 16. Sinusitis   Lozenges ordered on 1/14   Conservative management at present  LOS (Days) 9 A FACE TO FACE EVALUATION WAS PERFORMED  Jekhi Bolin Lorie Phenix 07/07/2017 11:39 AM

## 2017-07-07 NOTE — Progress Notes (Signed)
Physical Therapy Session Note  Patient Details  Name: [name] MRN: [id number] Date of Birth: 02/18/1941  Today's Date: 07/07/2017 PT Individual Time: 0623-7628, 3151-7616 PT Individual Time Calculation (min): 57 min , 30 min   Short Term Goals: Week 1:  PT Short Term Goal 1 (Week 1): Pt will perform sit<>stand transfer from low surface w/ min assist  PT Short Term Goal 2 (Week 1): Pt will ambulate 150' w/ LRAD w/ min assist  PT Short Term Goal 3 (Week 1): Pt will negotiate 4 steps w/ min assist  PT Short Term Goal 4 (Week 1): Pt will tolerate 30 min of OOB activity w/o increase in fatigue  Skilled Therapeutic Interventions/Progress Updates:    Session 1: Pt seated EOB with RN upon PT arrival, agreeable to therapy tx and denies pain. Pt agreeable to try using the bathroom this AM prior to going to the gym. Pt ambulated from bed>bathroom using RW with supervision and performed all clothing management with supervision. Pt performed sit<>stands from toilet using grab bars in order to clean peri area, continent of bowel and bladder this AM. Pt ambulated to sink with RW and supervision in order to wash hands and then sat EOB to perform upper and lower body dressing with supervision. Pt ambulated from room<>gym x 150 ft each direction using rollator with supervision. Pt worked on LE strengthening and standing balance this session. Pt performed 2 x 5 sit<>stands from elevated mat without UE support and a wedge under feet to increased weightbearing through heels. Pt performed 2 x 10 lateral step ups on each side for LE strengthening. Pt ambulated back to room using rollator and with supervision, left seated EOB with needs in reach and bed alarm set.   Session 2: Pt seated in recliner upon PT arrival, agreeable to therapy tx and denies pain. Pt ambulated from room>gym using rollator with supervision. Pt performed gastroc and soleous stretch on 3 inch step, B LEs 2 x 30 sec each. Pt  ascended/descended 12 steps using single handrail and with min assist for LE strengthening. Pt worked on sidestepping with theraband around LEs for resistance working on LE strength. Pt performed 2 x 5 sit<>stands without UE support for LE strengthening with min assist. Pt left in gym at end of session with hand off to OT.   Therapy Documentation Precautions:  Precautions Precautions: Fall Restrictions Weight Bearing Restrictions: No   See Function Navigator for Current Functional Status.   Therapy/Group: Individual Therapy  Netta Corrigan, PT, DPT 07/07/2017, 7:59 AM

## 2017-07-07 NOTE — Progress Notes (Signed)
Occupational Therapy Session Note  Patient Details  Name: [name] MRN: [id number] Date of Birth: 09/11/1940  Today's Date: 07/07/2017 OT Individual Time: 1001-1101 OT Individual Time Calculation (min): 60 min    Short Term Goals: Week 2:  OT Short Term Goal 1 (Week 2): Continue working on current LTGs set at supervision level overall.    Skilled Therapeutic Interventions/Progress Updates:    Pt completed functional mobility to the [facility] gym using the rollator for use of the Biodex and Dynavision.  He declined needing to bath as he still has a cold and did not want to get wet.  He was able to ambulate to the chairs at the elevator and then needed one rest break.  Once mobility was completed had pt complete several intervals of using the Dynavision while having pt stand on foam surface.  Min guard assist to maintain balance with pt at times using his off hand to stabilize himself on the Dynavision.  He also completed 2 sets with standing on solid surface, still with LOB and min assist needed for balance.  Also had pt complete 3 intervals of use with the Biodex.   He scored 16 % on random control with min guard assist and then finished with catch game.  Pt with increased use of head and trunk movements instead of ankle and hip strategies.  Had pt ambulate back to the room at end of session, without stopping, with close supervision.  He finished session in bedside recliner with call button and phone in reach.    Therapy Documentation Precautions:  Precautions Precautions: Fall Restrictions Weight Bearing Restrictions: No   Pain: Pain Assessment Pain Assessment: No/denies pain Faces Pain Scale: No hurt ADL: See Function Navigator for Current Functional Status.   Therapy/Group: Individual Therapy  Shyanne Mcclary OTR/L 07/07/2017, 12:31 PM

## 2017-07-08 ENCOUNTER — Inpatient Hospital Stay (HOSPITAL_COMMUNITY): Payer: Medicare Other | Admitting: Occupational Therapy

## 2017-07-08 ENCOUNTER — Inpatient Hospital Stay (HOSPITAL_COMMUNITY): Payer: Medicare Other | Admitting: Physical Therapy

## 2017-07-08 DIAGNOSIS — J040 Acute laryngitis: Secondary | ICD-10-CM

## 2017-07-08 LAB — GLUCOSE, CAPILLARY
GLUCOSE-CAPILLARY: 97 mg/dL (ref 65–99)
GLUCOSE-CAPILLARY: 108 mg/dL — AB (ref 65–99)
Glucose-Capillary: 139 mg/dL — ABNORMAL HIGH (ref 65–99)
Glucose-Capillary: 203 mg/dL — ABNORMAL HIGH (ref 65–99)

## 2017-07-08 NOTE — Discharge Instructions (Signed)
Inpatient Rehab Discharge Instructions  EDIBERTO SENS Discharge date and time: No discharge date for patient encounter.   Activities/Precautions/ Functional Status: Activity: activity as tolerated Diet: diabetic diet Wound Care: keep wound clean and dry Functional status:  ___ No restrictions     ___ Walk up steps independently ___ 24/7 supervision/assistance   ___ Walk up steps with assistance ___ Intermittent supervision/assistance  ___ Bathe/dress independently ___ Walk with walker     _x__ Bathe/dress with assistance ___ Walk Independently    ___ Shower independently ___ Walk with assistance    ___ Shower with assistance ___ No alcohol     ___ Return to work/school ________  Special Instructions: Foam dressing to left toes change every 3 days as needed  Santyl ointment right buttocks cover with moist gauze dressing and foam dressing change every 3 days as needed   COMMUNITY REFERRALS UPON DISCHARGE:    Home Health:   PT, RN, [facility]   Date of last service:07/09/2017  Medical Equipment/Items Ordered:None Needed-has all from previous admits      My questions have been answered and I understand these instructions. I will adhere to these goals and the provided educational materials after my discharge from the hospital.  Patient/Caregiver Signature _______________________________ Date __________  Clinician Signature _______________________________________ Date __________  Please bring this form and your medication list with you to all your follow-up doctor's appointments.

## 2017-07-08 NOTE — Progress Notes (Signed)
Occupational Therapy Discharge Summary  Patient Details  Name: [name] MRN: [id number] Date of Birth: 29-Nov-1940  Today's Date: 07/08/2017 OT Individual Time: 1100-1201 OT Individual Time Calculation (min): 61 min    Session Note:  Pt completed functional mobility to the day room, declining bathing again secondary to his cold.  Supervision for mobility, without rest break to and from the day room during session using the RW.  Once in the dayroom worked on dynamic standing balance using horse shoe tossing activity with use of the walker.  Issued reacher for picking up the horseshoes from the floor.  He also has two reachers at home as well for retrieving items from the floor.  Pt completed four intervals of tossing them and retrieving them without rest break, standing consecutively for more than 10 mins without rest.  Next had pt ambulate over to the UE ergonometer where he completed 2 sets of 5 mins each with resistance set on level 8.  RPMs maintained at an average of 25.  First set completed peddling forward and second set backwards.  Finished session with ambulation back to the room where he was left sitting on the EOB with call button and phone in reach waiting on his lunch.    Patient has met 9 of 9 long term goals due to improved activity tolerance, improved balance, postural control and ability to compensate for deficits.  Patient to discharge at overall Supervision level.  Patient's care partner is independent to provide the necessary physical assistance at discharge.    Reasons goals not met: NA  Recommendation:  Patient will benefit from ongoing skilled OT services in home health setting to continue to advance functional skills in the area of BADL and Reduce care partner burden.  Pt still demonstrates decreased static and dynamic standing balance for selfcare tasks.  Feel he will benefit from [facility] eval for safety and continued progression back to modified independent level for ADLs  and IADLs so that he can return home alone.    Equipment: No equipment provided  Reasons for discharge: treatment goals met and discharge from hospital  Patient/family agrees with progress made and goals achieved: Yes  OT Discharge Precautions/Restrictions  Precautions Precautions: Fall Restrictions Weight Bearing Restrictions: No  Pain Pain Assessment Pain Assessment: No/denies pain ADL  See Function Section of chart for details  Vision Baseline Vision/History: Wears glasses Wears Glasses: Reading only Patient Visual Report: No change from baseline Vision Assessment?: No apparent visual deficits Perception  Perception: Within Functional Limits Praxis Praxis: Intact Cognition Overall Cognitive Status: Within Functional Limits for tasks assessed Arousal/Alertness: Awake/alert Orientation Level: Oriented X4 Attention: Sustained Sustained Attention: Appears intact Memory: Appears intact Awareness: Appears intact Problem Solving: Appears intact Safety/Judgment: Appears intact Sensation Sensation Light Touch: Impaired by gross assessment Stereognosis: Appears Intact Hot/Cold: Appears Intact Proprioception: Appears Intact Additional Comments: Sensation intact in BUEs but impaired in LEs secondary to neuropathy Coordination Gross Motor Movements are Fluid and Coordinated: Yes Fine Motor Movements are Fluid and Coordinated: No Coordination and Movement Description: Pt with slower movements in the RUE secondary to previous CVA but uses functionally at a diminshed level during selfcare tasks.   Motor  Motor Motor: Hemiplegia Motor - Skilled Clinical Observations: Mild R hemi, generalized weakness Mobility  Transfers Transfers: Sit to Stand Sit to Stand: 5: Supervision;With upper extremity assist;With armrests;From chair/3-in-1 Stand to Sit: 5: Supervision;With armrests;With upper extremity assist;To chair/3-in-1  Trunk/Postural Assessment  Cervical  Assessment Cervical Assessment: Exceptions to WFL(increased forward head) Thoracic  Assessment Thoracic Assessment: Within Functional Limits Lumbar Assessment Lumbar Assessment: Exceptions to WFL(slight thoracic kyphosis)  Balance Balance Balance Assessed: Yes Static Sitting Balance Static Sitting - Balance Support: No upper extremity supported;Feet supported Static Sitting - Level of Assistance: 7: Independent Dynamic Sitting Balance Dynamic Sitting - Balance Support: No upper extremity supported;Feet supported;During functional activity Dynamic Sitting - Level of Assistance: 6: Modified independent (Device/Increase time) Static Standing Balance Static Standing - Balance Support: During functional activity Static Standing - Level of Assistance: 5: Stand by assistance Dynamic Standing Balance Dynamic Standing - Balance Support: During functional activity Dynamic Standing - Level of Assistance: 5: Stand by assistance Extremity/Trunk Assessment RUE Assessment RUE Assessment: Exceptions to [facility] for selfcare tasks.  Slight impairment with coordination secondary to old CVA but does not affect him functional ) LUE Assessment LUE Assessment: Within Functional Limits   See Function Navigator for Current Functional Status.  Kenidi Elenbaas OTR/L 07/08/2017, 12:21 PM

## 2017-07-08 NOTE — Discharge Summary (Signed)
Discharge summary job [phone]

## 2017-07-08 NOTE — Discharge Summary (Signed)
[name], [name]               ACCOUNT NO.:  [id number]  MEDICAL RECORD NO.:  [id number]  LOCATION:  4M10C                        FACILITY:  [facility]  PHYSICIAN:  Delice Lesch, MD        DATE OF BIRTH:  [birth year]  DATE OF ADMISSION:  06/28/2017 DATE OF DISCHARGE:  07/09/2017                              DISCHARGE SUMMARY   DISCHARGE DIAGNOSES: 1. Debility related to rhabdomyolysis, urosepsis with history of     cerebrovascular accident. 2. Subcutaneous Lovenox for deep vein thrombosis prophylaxis. 3. Pain management. 4. Hypertension. 5. History of urethral stricture and multiple dilations. 6. Paroxysmal atrial fibrillation. 7. Diabetes mellitus. 8. Peripheral neuropathy. 9. Acute blood loss anemia. 10.Enterococcus faecalis urinary tract infection. 11.Decreased nutritional storage. 58. 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  Received inpatient rehab services.  Maintained on Plavix.  The patient lives alone, independent with a rolling walker and straight cane prior to admission.  He still mosses on grass.  Presented on June 22, 2017, after being found down by his daughter in the bathroom, unable to get up from the toilet for approximately 10 hours with diffuse weakness.  He complained of some dysuria.  MRI of the brain negative for acute changes.  WBC 15,200, BUN 33, creatinine 1.88, CPK 12,430.  Urine study showed multiple leukocytes, large hemoglobin, no rbc's, no bacteria. Started on empiric antibiotic coverage, aggressive IV fluids.  Chest x- ray, negative.  Renal ultrasound, no hydronephrosis.  Urine study, negative nitrite.  Urine culture was re-incubated.  Blood  cultures negative.  Neurology consulted for possible urethral stricture, difficult catheterization.  Foley tube was placed.  Plan voiding trial. Subcutaneous Lovenox for DVT prophylaxis.  CK is trending downward to 1538.  Physical and occupational therapy ongoing.  The patient was admitted for comprehensive rehab program.  PAST MEDICAL HISTORY:  See discharge diagnoses.  SOCIAL HISTORY:  He lives alone, was using a walker prior to admission. He has a supportive daughter.  FUNCTIONAL STATUS UPON ADMISSION TO REHAB SERVICES:  Moderate assist sit to stand, moderate assist sit to supine, mod to max assist activities of daily living.  PHYSICAL EXAMINATION:  VITAL SIGNS:  Blood pressure 149/54, pulse 62, temperature 98, and respirations 20. GENERAL:  Alert male, in no acute distress.  Oriented to person, place, and time. HEENT:  EOMs intact. NECK:  Supple, nontender.  No JVD. CARDIAC:  Rate controlled. ABDOMEN:  Soft, nontender.  Good bowel sounds. LUNGS:  Decreased breath sounds at the bases.  Clear to auscultation.  REHABILITATION HOSPITAL COURSE:  The patient was admitted to Inpatient Rehab Services with therapies initiated on a 3-hour daily basis, consisting of physical therapy, occupational therapy, and rehabilitation nursing.  The following issues were addressed during the patient's rehabilitation stay.  Pertaining to Mr. Mizzell' debility related to rhabdomyolysis, fall, urosepsis, remained stable.  He was participating with his therapies.  Noted history of CVA in the past with right-sided residual weakness.  He continued on aspirin and Plavix therapy.  Blood pressures overall controlled on Norvasc 10 mg daily, low dose lisinopril as well as Lopressor.  History of urethral strictures, multiple dilations.  He would follow up with Urology Services.  He continued on Flomax as well as Urecholine.  The patient voiding without retention. Foley tube had since been discontinued.   Postvoid residuals continued to improve overall.  He would follow up outpatient with Urology Services. Cardiac, rate controlled.  No chest pain or shortness of breath. Diabetes mellitus, peripheral neuropathy.  Hemoglobin A1c of 7.  He continued on insulin therapy with full diabetic teaching.  Acute lower UTI empiric Macrobid through July 08, 2017.  He remained afebrile. He did have a productive cough.  Chest x-ray unremarkable for acute process.  The patient received weekly collaborative interdisciplinary team conferences to discuss estimated length of stay, family teaching, any barriers to discharge.  He ambulates from the bedroom to the bathroom using a rolling walker supervision, performed all clothing management with supervision.  Performed sit to stand from toilet using grab bars supervision.  Ambulates to the rehab gymnasium, 150 feet, rolling walker, supervision.  Strength and endurance greatly improved. The patient encouraged overall progress.  Plan was discharge to home.  DISCHARGE MEDICATIONS: 1. Norvasc 10 mg p.o. daily. 2. Aspirin 81 mg p.o. 3 times weekly. 3. Urecholine 50 mg p.o. t.i.d. 4. Plavix 75 mg p.o. daily. 5. Lantus insulin 12 units daily. 6. Lisinopril 7.5 mg p.o. daily. 7. Lopressor 25 mg p.o. b.i.d. 8. Flomax 0.8 mg p.o. daily. 9. Tylenol as needed. 10. Zocor 20 mg daily at bedtime  DIET:  His diet was a diabetic diet.  FOLLOWUP:  He would follow up with Dr. Delice Lesch at the Outpatient Rehab Service office as directed, Dr. Irine Seal, Urology Service, call for appointment, Donnie Coffin, Medical Management.  SPECIAL INSTRUCTIONS:  No driving.     Lauraine Rinne, P.A.   ______________________________ Delice Lesch, MD    DA/MEDQ  D:  07/08/2017  T:  07/08/2017  Job:  [id number]  cc:   Marshall Cork. Jeffie Pollock, M.D. Donavan Burnet, M.D. Delice Lesch, MD

## 2017-07-08 NOTE — Progress Notes (Signed)
[place] PHYSICAL MEDICINE & REHABILITATION     PROGRESS NOTE  Subjective/Complaints:  Patient seen lying in bed this morning. He states he slept better overnight. He is upset that he had some issues getting his throat lozenges yesterday.  ROS: +Sore throat. Denies CP, SOB, N/V/D.  Objective: Vital Signs: Blood pressure (!) 141/32, pulse 71, temperature 97.8 F (36.6 C), temperature source Oral, resp. rate 16, height 5\' 9"  (1.753 m), weight 79.2 kg (174 lb 9.7 oz), SpO2 98 %. No results found. Recent Labs    07/05/17 0910  WBC 14.7*  HGB 13.5  HCT 40.3  PLT 225   Recent Labs    07/05/17 0910  NA 137  K 3.9  CL 102  GLUCOSE 177*  BUN 23*  CREATININE 0.79  CALCIUM 8.7*   CBG (last 3)  Recent Labs    07/07/17 1708 07/07/17 2038 07/08/17 0617  GLUCAP 240* 244* 97    Wt Readings from Last 3 Encounters:  07/07/17 79.2 kg (174 lb 9.7 oz)  06/22/17 79.8 kg (176 lb)  09/23/15 87.2 kg (192 lb 4.8 oz)    Physical Exam:  BP (!) 141/32   Pulse 71   Temp 97.8 F (36.6 C) (Oral)   Resp 16   Ht 5\' 9"  (1.753 m)   Wt 79.2 kg (174 lb 9.7 oz)   SpO2 98%   BMI 25.78 kg/m  Gen: NAD. Vital signs reviewed.  HENT: Normocephalic. Atraumatic. +Hoarse voice.  Eyes:EOMare normal. No discharge.  Cardiovascular:RRR. No JVD. Respiratory:Effort normal and breath sounds normal.  GI: Bowel sounds are normal. He exhibitsno distension. Musculoskeletal: No edema. No tenderness in extremities. Neurological: He isalertand oriented Normal insight and awareness.  Motor:  Right lower extremity: 4-/5 hip flexion, 4+/5 knee extension and ankle dorsiflexion/plantarflexion.  Left lower extremity: 4/5 hip flexion,4+/5 knee extension, 4+/5 ankle dorsiflexion and plantar flexion (stable). Skin. Warm and dry Psychiatric: Patient pleasant and appropriate.  Assessment/Plan: 1. Functional deficits secondary to fall/urosepsis as well as history of CVA with right-sided residual weakness  which require 3+ hours per day of interdisciplinary therapy in a comprehensive inpatient rehab setting. Physiatrist is providing close team supervision and 24 hour management of active medical problems listed below. Physiatrist and rehab team continue to assess barriers to discharge/monitor patient progress toward functional and medical goals.  Function:  Bathing Bathing position   Position: Wheelchair/chair at sink  Bathing parts Body parts bathed by patient: Right arm, Left arm, Chest, Abdomen, Right upper leg, Left upper leg, Right lower leg, Left lower leg, Front perineal area, Buttocks Body parts bathed by helper: Back  Bathing assist Assist Level: Touching or steadying assistance(Pt > 75%)      Upper Body Dressing/Undressing Upper body dressing   What is the patient wearing?: Pull over shirt/dress, Button up shirt     Pull over shirt/dress - Perfomed by patient: Thread/unthread right sleeve, Thread/unthread left sleeve, Put head through opening, Pull shirt over trunk   Button up shirt - Perfomed by patient: Thread/unthread right sleeve, Thread/unthread left sleeve, Button/unbutton shirt, Pull shirt around back      Upper body assist Assist Level: Supervision or verbal cues      Lower Body Dressing/Undressing Lower body dressing   What is the patient wearing?: Pants, Non-skid slipper socks     Pants- Performed by patient: Thread/unthread right pants leg, Thread/unthread left pants leg, Pull pants up/down, Fasten/unfasten pants Pants- Performed by helper: Thread/unthread left pants leg, Pull pants up/down, Fasten/unfasten pants Non-skid slipper socks-  Performed by patient: Don/doff right sock, Don/doff left sock                    Lower body assist Assist for lower body dressing: Touching or steadying assistance (Pt > 75%)      Toileting Toileting     Toileting steps completed by helper: Adjust clothing prior to toileting, Performs perineal hygiene, Adjust  clothing after toileting    Toileting assist Assist level: More than reasonable time   Transfers Chair/bed transfer   Chair/bed transfer method: Ambulatory Chair/bed transfer assist level: Supervision or verbal cues Chair/bed transfer assistive device: Armrests, Medical sales representative     Max distance: 200' Assist level: Supervision or verbal cues   Wheelchair   Type: Manual Max wheelchair distance: 200' Assist Level: Supervision or verbal cues  Cognition Comprehension Comprehension assist level: Follows basic conversation/direction with no assist  Expression Expression assist level: Expresses basic needs/ideas: With no assist  Social Interaction Social Interaction assist level: Interacts appropriately 90% of the time - Needs monitoring or encouragement for participation or interaction.  Problem Solving Problem solving assist level: Solves basic problems with no assist  Memory Memory assist level: Recognizes or recalls 90% of the time/requires cueing < 10% of the time    Medical Problem List and Plan: 1.  Debility secondary to rhabdomyolysis related to fall/urosepsis as well as history of CVA with right-sided residual weakness.    Cont CIR, plan for DC tomorrow 2.  DVT Prophylaxis/Anticoagulation: Subcutaneous Lovenox. Monitor platelet counts in any signs of bleeding 3. Pain Management: Tylenol as needed 4. Mood: Provide emotional support 5. Neuropsych: This patient is capable of making decisions on his own behalf. 6. Skin/Wound Care: Routine skin checks 7. Fluids/Electrolytes/Nutrition: Routine I&O's   BMP within acceptable range on 1/14, except for glucose 8. Hypertension.    Norvasc 5 mg daily, increased to 10 on 1/9   Lisinopril 2.5 started on 1/12, increased to 7.5 on 1/14   Lopressor 25 mg twice a day   Overall controlled on 1/17 9. History of urethral stricture multiple dilations. Follow-up urology services.  Rhabdomyolysis continue Flomax 0.8 mg daily.      Bethanechol started on 1/9, increased on 1/11, increased again on 1/12   D/ced foley    Pt voiding with retention   Discussed with nursing, PVRs improving and patient able to empty with double voids. 10. PAF. Cardiac rate controlled. Continue aspirin and Plavix 11. Diabetes mellitus with peripheral neuropathy. Hemoglobin A1c 7.0. Lantus insulin 12 units daily. Check blood sugars before meals and at bedtime. Diabetic teaching   Relatively controlled with some elevations yesterday 12. ABLA: Resolved  Hb 13.5 on 1/14   Cont to monitor 13. Acute lower UTI   WBCs 14.7 on 1/14   Afebrile  UA +, Ucx enterococcus faecalis   Empiric Macrobid 1/11-1/17 14. Hypoalbuminemia  Supplement initiated on 1/9 15. Sinusitis/laryngitis   Lozenges ordered on 1/14   Conservative management at present  LOS (Days) 10 A FACE TO FACE EVALUATION WAS PERFORMED  Kayleeann Huxford Lorie Phenix 07/08/2017 8:39 AM

## 2017-07-08 NOTE — Progress Notes (Signed)
Physical Therapy Discharge Summary  Patient Details  Name: [name] MRN: [id number] Date of Birth: Jun 13, 1941  Today's Date: 07/08/2017 PT Individual Time: 0800-0910 AND 1330-1425 PT Individual Time Calculation (min): 70 min AND 55 min    Session 1:  Pt sitting EOB in care of RN and agreeable to therapy, no c/o pain. Maintained dynamic sitting balance w/ supervision while putting on shirt, pants, and shoes w/ set-up assist only. Performed bed mobility at Mod I level w/o use of rails. Ambulated to/from toilet in bathroom w/ RW and supervision, confirmed pt would be using bedside commode while at daughter's home. Discussed AD use at home including RW for in-home ambulation and rollator for community ambulation 2/2 impaired endurance and need to sit down to rest. Discussed graduating to cane for community ambulation once pt and daughter feel his endurance and strength is adequate, pt verbalized understanding and in agreement. Daughter only able to provide supervision level assistance and pt verbalizes understanding to have supervision for all mobility at home at discharge for safety. Ambulated 150' to perform car transfer in gym. Performed car transfer at level of daughter's truck as this is what pt will be going home in, supervision level assist and verbal cues for technique. Practiced transfer at height of pt's truck (higher than daughter's) and required min assist. Ambulated to gym to practice floor transfer, pt able to verbalize and perform correct technique w/o prompting however required min assist to boost back to mat 2/2 LE fatigue. Discussed being able to direct son-in-law or grandson for assistance as daughter cannot provide that level of assistance, pt verbalized understanding. Returned to room and ended session sitting EOB, call bell within reach and all needs met.   Session 2:  Pt sitting EOB and agreeable to therapy, no c/o pain. Ambulated to/from therapy gym w/ supervision using RW and  practiced negotiating 12 stairs w/ supervision. Performed Berg Balance Scale. Practiced ambulating in home-like environment w/ uneven surfaces and obstacles w/ close supervision in 40-50' bouts. Ambulated back to room and ended session sitting EOB, call bell within reach and all needs met.   Patient has met 6 of 6 long term goals due to improved activity tolerance, improved balance and increased strength.  Patient to discharge at an ambulatory level Supervision.   Patient's care partner is independent to provide the necessary physical assistance at discharge.  Reasons goals not met: n/a  Recommendation:  Patient will benefit from ongoing skilled PT services in home health setting to continue to advance safe functional mobility, address ongoing impairments in functional endurance, LE strength, balance, and minimize fall risk.  Equipment: No equipment provided  Reasons for discharge: treatment goals met and discharge from hospital  Patient/family agrees with progress made and goals achieved: Yes  PT Discharge Precautions/Restrictions Precautions Precautions: Fall Restrictions Weight Bearing Restrictions: No Vital Signs Therapy Vitals Pulse Rate: 71 BP: (!) 141/32 Vision/Perception  Perception Perception: Within Functional Limits Praxis Praxis: Intact  Cognition Overall Cognitive Status: Within Functional Limits for tasks assessed Arousal/Alertness: Awake/alert Orientation Level: Oriented X4 Attention: Sustained Sustained Attention: Appears intact Memory: Appears intact Awareness: Appears intact Problem Solving: Appears intact Safety/Judgment: Appears intact Sensation Sensation Light Touch: Impaired by gross assessment(Impaired sensation BLEs in feet up to mid-lower leg) Proprioception: Appears Intact Coordination Gross Motor Movements are Fluid and Coordinated: Yes Fine Motor Movements are Fluid and Coordinated: No Motor  Motor Motor: Hemiplegia Motor - Skilled  Clinical Observations: Mild R hemi, generalized weakness  Mobility Bed Mobility Bed Mobility: Rolling  Right;Rolling Left;Supine to Sit;Sit to Supine Rolling Right: 6: Modified independent (Device/Increase time) Rolling Left: 6: Modified independent (Device/Increase time) Supine to Sit: 6: Modified independent (Device/Increase time) Sit to Supine: 6: Modified independent (Device/Increase time) Transfers Transfers: Yes Sit to Stand: 5: Supervision Stand to Sit: 5: Supervision Stand Pivot Transfers: 5: Supervision Locomotion  Ambulation Ambulation: Yes Ambulation/Gait Assistance: 5: Supervision Assistive device: Rolling walker Gait Gait: Yes Gait Pattern: Impaired Gait Pattern: Poor foot clearance - right;Trunk flexed;Shuffle Gait velocity: Decreased Stairs / Additional Locomotion Stairs: Yes Stairs Assistance: 5: Supervision Stair Management Technique: One rail Left(L ascending) Number of Stairs: 12 Height of Stairs: 6 Wheelchair Mobility Wheelchair Mobility: No  Trunk/Postural Assessment  Cervical Assessment Cervical Assessment: Exceptions to WFL(forward head posture) Thoracic Assessment Thoracic Assessment: Within Functional Limits Lumbar Assessment Lumbar Assessment: Exceptions to WFL(posterior pelvic tilt) Postural Control Postural Control: Within Functional Limits  Balance Balance Balance Assessed: Yes Standardized Balance Assessment Standardized Balance Assessment: Berg Balance Test Berg Balance Test Sit to Stand: Able to stand without using hands and stabilize independently Standing Unsupported: Able to stand 30 seconds unsupported Sitting with Back Unsupported but Feet Supported on Floor or Stool: Able to sit safely and securely 2 minutes Stand to Sit: Controls descent by using hands Transfers: Able to transfer safely, definite need of hands Standing Unsupported with Eyes Closed: Able to stand 10 seconds with supervision Standing Ubsupported with Feet  Together: Able to place feet together independently but unable to hold for 30 seconds From Standing, Reach Forward with Outstretched Arm: Can reach forward >12 cm safely (5") From Standing Position, Pick up Object from Floor: Unable to pick up shoe, but reaches 2-5 cm (1-2") from shoe and balances independently From Standing Position, Turn to Look Behind Over each Shoulder: Needs supervision when turning Turn 360 Degrees: Needs assistance while turning Standing Unsupported, Alternately Place Feet on Step/Stool: Able to complete >2 steps/needs minimal assist Standing Unsupported, One Foot in Front: Needs help to step but can hold 15 seconds Standing on One Leg: Unable to try or needs assist to prevent fall Total Score: 29 Static Sitting Balance Static Sitting - Balance Support: No upper extremity supported;Feet supported Static Sitting - Level of Assistance: 6: Modified independent (Device/Increase time) Dynamic Sitting Balance Dynamic Sitting - Balance Support: No upper extremity supported;Feet supported;During functional activity(while putting on LE garments) Dynamic Sitting - Level of Assistance: 5: Stand by assistance Static Standing Balance Static Standing - Balance Support: During functional activity;Bilateral upper extremity supported Static Standing - Level of Assistance: 5: Stand by assistance Dynamic Standing Balance Dynamic Standing - Balance Support: During functional activity;Bilateral upper extremity supported Dynamic Standing - Level of Assistance: 5: Stand by assistance Extremity Assessment  RLE Assessment RLE Assessment: Exceptions to WFL(4 to 4+/5 globally, increased ankle stiffness) LLE Assessment LLE Assessment: Exceptions to WFL(strength WFL, increased ankle stiffness)   See Function Navigator for Current Functional Status.  Madissen Wyse K Arnette 07/08/2017, 8:18 AM

## 2017-07-08 NOTE — Progress Notes (Signed)
Social Work  Discharge Note  The overall goal for the admission was met for:   Discharge location: Yes-HOME TO [place].   Length of Stay: Yes-11 DAYS  Discharge activity level: Yes-SUPERVISION LEVEL  Home/community participation: Yes  Services provided included: MD, RD, PT, OT, RN, CM, Pharmacy and SW  Financial Services: Private Insurance: [facility]  Follow-up services arranged: Home Health: [facility]-PT,OT,RN and Patient/Family request agency HH: USED THEM BEFORE, DME: NO NEEDS  Comments (or additional information):DAUGHTER HAS BEEN IN AND OBSERVED PT IN THERAPIES, CAN PROVIDE SUPERVISION LEVEL AT HOME.  Patient/Family verbalized understanding of follow-up arrangements: Yes  Individual responsible for coordination of the follow-up plan: SELF & MARIA-DAUGHTER  Confirmed correct DME delivered: Elease Hashimoto 07/08/2017    Elease Hashimoto

## 2017-07-09 LAB — GLUCOSE, CAPILLARY: Glucose-Capillary: 91 mg/dL (ref 65–99)

## 2017-07-09 MED ORDER — METOPROLOL TARTRATE 25 MG PO TABS: 25.0000 mg | ORAL_TABLET | Freq: Two times a day (BID) | ORAL | 0 refills | Status: DC

## 2017-07-09 MED ORDER — BETHANECHOL CHLORIDE 50 MG PO TABS: 50.0000 mg | ORAL_TABLET | Freq: Three times a day (TID) | ORAL | 0 refills | Status: DC

## 2017-07-09 MED ORDER — INSULIN GLARGINE 100 UNITS/ML SOLOSTAR PEN: 12.0000 [IU] | PEN_INJECTOR | Freq: Every day | SUBCUTANEOUS | 11 refills | Status: DC

## 2017-07-09 MED ORDER — SIMVASTATIN 20 MG PO TABS: 20.0000 mg | ORAL_TABLET | Freq: Every day | ORAL | 0 refills | Status: DC

## 2017-07-09 MED ORDER — TAMSULOSIN HCL 0.4 MG PO CAPS: 0.8000 mg | ORAL_CAPSULE | Freq: Every day | ORAL | 0 refills | Status: DC

## 2017-07-09 MED ORDER — AMLODIPINE BESYLATE 10 MG PO TABS: 10.0000 mg | ORAL_TABLET | Freq: Every day | ORAL | 0 refills | Status: DC

## 2017-07-09 MED ORDER — CLOPIDOGREL BISULFATE 75 MG PO TABS: 75.0000 mg | ORAL_TABLET | Freq: Every day | ORAL | 0 refills | Status: DC

## 2017-07-09 MED ORDER — LISINOPRIL 2.5 MG PO TABS: 7.5000 mg | ORAL_TABLET | Freq: Every day | ORAL | 0 refills | Status: DC

## 2017-07-09 NOTE — Progress Notes (Signed)
[place] PHYSICAL MEDICINE & REHABILITATION     PROGRESS NOTE  Subjective/Complaints:  Patient seen lying in bed this morning. He states he slept well overnight. He states he slept the best he is ever slept. He also notes improvement in his URI. He is excited about going home today.  ROS: Denies CP, SOB, N/V/D.  Objective: Vital Signs: Blood pressure (!) 150/56, pulse 64, temperature 97.7 F (36.5 C), temperature source Oral, resp. rate 18, height 5\' 9"  (1.753 m), weight 79.2 kg (174 lb 9.7 oz), SpO2 99 %. No results found. No results for input(s): WBC, HGB, HCT, PLT in the last 72 hours. No results for input(s): NA, K, CL, GLUCOSE, BUN, CREATININE, CALCIUM in the last 72 hours.  Invalid input(s): CO CBG (last 3)  Recent Labs    07/08/17 1725 07/08/17 2120 07/09/17 0633  GLUCAP 139* 203* 91    Wt Readings from Last 3 Encounters:  07/07/17 79.2 kg (174 lb 9.7 oz)  06/22/17 79.8 kg (176 lb)  09/23/15 87.2 kg (192 lb 4.8 oz)    Physical Exam:  BP (!) 150/56 (BP Location: Right Arm)   Pulse 64   Temp 97.7 F (36.5 C) (Oral)   Resp 18   Ht 5\' 9"  (1.753 m)   Wt 79.2 kg (174 lb 9.7 oz)   SpO2 99%   BMI 25.78 kg/m  Gen: NAD. Vital signs reviewed.  HENT: Normocephalic. Atraumatic. +Hoarse voice, improving.  Eyes:EOMare normal. No discharge.  Cardiovascular:RRR. No JVD. Respiratory:Effort normal and breath sounds normal.  GI: Bowel sounds are normal. He exhibitsno distension. Musculoskeletal: No edema. No tenderness in extremities. Neurological: He isalertand oriented Normal insight and awareness.  Motor:  Right lower extremity: 4-/5 hip flexion, 4+/5 knee extension and ankle dorsiflexion/plantarflexion. (improving) Left lower extremity: 4/5 hip flexion,4+/5 knee extension, 4+/5 ankle dorsiflexion and plantar flexion (improving). Skin. Warm and dry Psychiatric: Patient pleasant and appropriate.  Assessment/Plan: 1. Functional deficits secondary to  fall/urosepsis as well as history of CVA with right-sided residual weakness which require 3+ hours per day of interdisciplinary therapy in a comprehensive inpatient rehab setting. Physiatrist is providing close team supervision and 24 hour management of active medical problems listed below. Physiatrist and rehab team continue to assess barriers to discharge/monitor patient progress toward functional and medical goals.  Function:  Bathing Bathing position   Position: Wheelchair/chair at sink  Bathing parts Body parts bathed by patient: Right arm, Left arm, Chest, Abdomen, Front perineal area, Buttocks, Right upper leg, Left upper leg, Left lower leg, Right lower leg, Back Body parts bathed by helper: Back  Bathing assist Assist Level: Supervision or verbal cues      Upper Body Dressing/Undressing Upper body dressing   What is the patient wearing?: Button up shirt, Pull over shirt/dress     Pull over shirt/dress - Perfomed by patient: Thread/unthread right sleeve, Thread/unthread left sleeve, Put head through opening, Pull shirt over trunk   Button up shirt - Perfomed by patient: Thread/unthread right sleeve, Thread/unthread left sleeve, Button/unbutton shirt, Pull shirt around back      Upper body assist Assist Level: Supervision or verbal cues      Lower Body Dressing/Undressing Lower body dressing   What is the patient wearing?: Pants, Non-skid slipper socks, Shoes     Pants- Performed by patient: Thread/unthread right pants leg, Thread/unthread left pants leg, Pull pants up/down, Fasten/unfasten pants Pants- Performed by helper: Thread/unthread left pants leg, Pull pants up/down, Fasten/unfasten pants Non-skid slipper socks- Performed by patient: Don/doff  right sock, Don/doff left sock       Shoes - Performed by patient: Don/doff right shoe, Don/doff left shoe, Fasten right, Fasten left            Lower body assist Assist for lower body dressing: Supervision or verbal  cues      Toileting Toileting   Toileting steps completed by patient: Adjust clothing prior to toileting, Performs perineal hygiene, Adjust clothing after toileting Toileting steps completed by helper: Adjust clothing prior to toileting, Performs perineal hygiene, Adjust clothing after toileting    Toileting assist Assist level: Touching or steadying assistance (Pt.75%)   Transfers Chair/bed transfer   Chair/bed transfer method: Ambulatory Chair/bed transfer assist level: Supervision or verbal cues Chair/bed transfer assistive device: Armrests, Medical sales representative     Max distance: 200' Assist level: Supervision or verbal cues   Wheelchair   Type: Manual Max wheelchair distance: 200' Assist Level: Supervision or verbal cues  Cognition Comprehension Comprehension assist level: Follows complex conversation/direction with no assist  Expression Expression assist level: Expresses complex ideas: With no assist  Social Interaction Social Interaction assist level: Interacts appropriately with others - No medications needed.  Problem Solving Problem solving assist level: Solves complex problems: Recognizes & self-corrects  Memory Memory assist level: Complete Independence: No helper    Medical Problem List and Plan: 1.  Debility secondary to rhabdomyolysis related to fall/urosepsis as well as history of CVA with right-sided residual weakness.    D/c today  Will see patient for transitional care management in 1-2 weeks 2.  DVT Prophylaxis/Anticoagulation: Subcutaneous Lovenox. Monitor platelet counts in any signs of bleeding 3. Pain Management: Tylenol as needed 4. Mood: Provide emotional support 5. Neuropsych: This patient is capable of making decisions on his own behalf. 6. Skin/Wound Care: Routine skin checks 7. Fluids/Electrolytes/Nutrition: Routine I&O's   BMP within acceptable range on 1/14, except for glucose 8. Hypertension.    Norvasc 5 mg daily, increased  to 10 on 1/9   Lisinopril 2.5 started on 1/12, increased to 7.5 on 1/14   Lopressor 25 mg twice a day   Slightly labile, but overall controlled on 1/18 9. History of urethral stricture multiple dilations. Follow-up urology services.  Rhabdomyolysis continue Flomax 0.8 mg daily.     Bethanechol started on 1/9, increased on 1/11, increased again on 1/12   D/ced foley    Pt voiding with retention   Discussed with nursing, PVRs improving and patient able to empty with double voids.   Encourage time voiding 10. PAF. Cardiac rate controlled. Continue aspirin and Plavix 11. Diabetes mellitus with peripheral neuropathy. Hemoglobin A1c 7.0. Lantus insulin 12 units daily. Check blood sugars before meals and at bedtime. Diabetic teaching   Relatively controlled on 1/18 12. ABLA: Resolved  Hb 13.5 on 1/14   Cont to monitor 13. Acute lower UTI   WBCs 14.7 on 1/14   Afebrile  UA +, Ucx enterococcus faecalis   Empiric Macrobid completed 1/11-1/17   Follow up labs as outpatient 14. Hypoalbuminemia  Supplement initiated on 1/9 15. Sinusitis/laryngitis   Lozenges ordered on 1/14   Conservative management at present 16. Leukocytosis   Recent UTI + URI  Afebrile, clinically stable   Follow up labs as outpatient  LOS (Days) 11 A FACE TO FACE EVALUATION WAS PERFORMED  Ankit Lorie Phenix 07/09/2017 8:27 AM

## 2017-07-09 NOTE — Progress Notes (Signed)
Patient and family discussed all the discharged instructions with Linna Hoff A,PA before they left.

## 2017-07-22 ENCOUNTER — Encounter: Payer: Medicare Other | Attending: Physical Medicine & Rehabilitation | Admitting: Physical Medicine & Rehabilitation

## 2017-07-22 ENCOUNTER — Encounter: Payer: Self-pay | Admitting: Physical Medicine & Rehabilitation

## 2017-07-22 VITALS — BP 136/60 | HR 58 | Resp 14

## 2017-07-22 DIAGNOSIS — R269 Unspecified abnormalities of gait and mobility: Secondary | ICD-10-CM | POA: Diagnosis not present

## 2017-07-22 DIAGNOSIS — I69351 Hemiplegia and hemiparesis following cerebral infarction affecting right dominant side: Secondary | ICD-10-CM | POA: Insufficient documentation

## 2017-07-22 DIAGNOSIS — Z87891 Personal history of nicotine dependence: Secondary | ICD-10-CM | POA: Insufficient documentation

## 2017-07-22 DIAGNOSIS — G600 Hereditary motor and sensory neuropathy: Secondary | ICD-10-CM | POA: Insufficient documentation

## 2017-07-22 DIAGNOSIS — N35919 Unspecified urethral stricture, male, unspecified site: Secondary | ICD-10-CM | POA: Diagnosis not present

## 2017-07-22 DIAGNOSIS — Z87448 Personal history of other diseases of urinary system: Secondary | ICD-10-CM

## 2017-07-22 DIAGNOSIS — M6282 Rhabdomyolysis: Secondary | ICD-10-CM | POA: Diagnosis not present

## 2017-07-22 DIAGNOSIS — B9789 Other viral agents as the cause of diseases classified elsewhere: Secondary | ICD-10-CM | POA: Diagnosis not present

## 2017-07-22 DIAGNOSIS — Z9181 History of falling: Secondary | ICD-10-CM | POA: Insufficient documentation

## 2017-07-22 DIAGNOSIS — I10 Essential (primary) hypertension: Secondary | ICD-10-CM

## 2017-07-22 DIAGNOSIS — E119 Type 2 diabetes mellitus without complications: Secondary | ICD-10-CM | POA: Diagnosis not present

## 2017-07-22 DIAGNOSIS — Z8572 Personal history of non-Hodgkin lymphomas: Secondary | ICD-10-CM | POA: Insufficient documentation

## 2017-07-22 DIAGNOSIS — Z9221 Personal history of antineoplastic chemotherapy: Secondary | ICD-10-CM | POA: Insufficient documentation

## 2017-07-22 DIAGNOSIS — Z8249 Family history of ischemic heart disease and other diseases of the circulatory system: Secondary | ICD-10-CM | POA: Diagnosis not present

## 2017-07-22 DIAGNOSIS — R5381 Other malaise: Secondary | ICD-10-CM | POA: Insufficient documentation

## 2017-07-22 DIAGNOSIS — Z89421 Acquired absence of other right toe(s): Secondary | ICD-10-CM | POA: Insufficient documentation

## 2017-07-22 DIAGNOSIS — J069 Acute upper respiratory infection, unspecified: Secondary | ICD-10-CM | POA: Insufficient documentation

## 2017-07-22 DIAGNOSIS — J040 Acute laryngitis: Secondary | ICD-10-CM

## 2017-07-22 DIAGNOSIS — Z87440 Personal history of urinary (tract) infections: Secondary | ICD-10-CM | POA: Diagnosis not present

## 2017-07-22 DIAGNOSIS — Z981 Arthrodesis status: Secondary | ICD-10-CM | POA: Insufficient documentation

## 2017-07-22 DIAGNOSIS — Z923 Personal history of irradiation: Secondary | ICD-10-CM | POA: Insufficient documentation

## 2017-07-22 DIAGNOSIS — I480 Paroxysmal atrial fibrillation: Secondary | ICD-10-CM | POA: Diagnosis not present

## 2017-07-22 NOTE — Progress Notes (Signed)
Subjective:    Patient ID: [name], male    DOB: 1941-04-25, 77 y.o.   MRN: [id number]  HPI 77 year old right-handed male with history of hypertension, atrial fibrillation, maintained on aspirin, but stopped taking it due to nosebleeds, non-Hodgkin's lymphoma with chemotherapy and radiation, urethral strictures and multiple UTIs as well as CVA in 2010 presents for hospital follow up after receiving CIR for debility.   DATE OF ADMISSION:  06/28/2017 DATE OF DISCHARGE:  07/09/2017  Daughter present, who supplements history. At discharge, pt was instructed to follow up with Urology, which he did. He saw his PCP as well.  BP is controlled. CBGs have been controlled. He states his cold is almost resolved.  He did not live the lozanges in the hospital, but states he purchases "Halls" OTC and they have been more effective for him. Denies falls.  Therapies: In the process of being set up DME: Previously possessed  Mobility: Walker at all times  Pain Inventory Average Pain 2 Pain Right Now 0 My pain is no pain  In the last 24 hours, has pain interfered with the following? General activity 0 Relation with others 0 Enjoyment of life 0 What TIME of day is your pain at its worst? no pain Sleep (in general) Good  Pain is worse with: no pain Pain improves with: no pain Relief from Meds: 0  Mobility use a cane use a walker ability to climb steps?  yes do you drive?  yes  Function disabled: date disabled . retired I need assistance with the following:  household duties  Neuro/Psych numbness tingling trouble walking  Prior Studies hospital f/u  Physicians involved in your care hospital f/u   Family History  Problem Relation Age of Onset  . Hypertension Mother   . Hypertension Father    Social History   Socioeconomic History  . Marital status: Widowed    Spouse name: None  . Number of children: None  . Years of education: None  . Highest education level:  None  Social Needs  . Financial resource strain: None  . Food insecurity - worry: None  . Food insecurity - inability: None  . Transportation needs - medical: None  . Transportation needs - non-medical: None  Occupational History  . None  Tobacco Use  . Smoking status: Former Smoker    Packs/day: 2.00    Years: 35.00    Pack years: 70.00    Last attempt to quit: 06/03/1991    Years since quitting: 26.1  . Smokeless tobacco: Former Systems developer    Quit date: 06/26/1990  Substance and Sexual Activity  . Alcohol use: No  . Drug use: No  . Sexual activity: None  Other Topics Concern  . None  Social History Narrative  . None   Past Surgical History:  Procedure Laterality Date  . ANKLE FUSION Left   . CATARACT EXTRACTION    . CIRCUMCISION    . TOE AMPUTATION     right foot third toe   . URETHRA SURGERY     Past Medical History:  Diagnosis Date  . A-fib ([place])   . 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 B-cell lymphoma ([place])   . Cataract   . Charcot-Marie-Tooth disease   . Diabetes mellitus   . Hypertension   . 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 SIRS (systemic inflammatory response syndrome) ([place]) 09/2015  . Stroke ([place])   . Weakness  BP 136/60 (BP Location: Right Arm, Patient Position: Sitting, Cuff Size: Normal)   Pulse (!) 58   Resp 14   SpO2 97%   Opioid Risk Score:   Fall Risk Score:  `1  Depression screen PHQ 2/9  Depression screen PHQ 2/9 06/24/2011  Decreased Interest 0  Down, Depressed, Hopeless 0  PHQ - 2 Score 0     Review of Systems  Constitutional: Negative.   HENT: Negative.   Eyes: Negative.   Respiratory: Negative.   Cardiovascular: Positive for leg swelling.  Gastrointestinal: Positive for constipation and diarrhea.  Endocrine: Negative.        High blood sugar  Genitourinary: Negative.   Musculoskeletal: Positive for gait problem.  Skin: Negative.   Allergic/Immunologic: Negative.   Neurological:  Positive for numbness.       Tingling  Hematological: Bruises/bleeds easily.  Psychiatric/Behavioral: Negative.   All other systems reviewed and are negative.      Objective:   Physical Exam Gen: NAD. Vital signs reviewed.  HENT: Normocephalic. Atraumatic.  Eyes: EOM are normal. No discharge.  Cardiovascular: RRR. No JVD. Respiratory: Effort normal and breath sounds normal.  GI: Bowel sounds are normal. He exhibits no distension.  Musculoskeletal: No edema. No tenderness in extremities. Neurological: He is alert and oriented Normal insight and awareness.  Motor:  Right lower extremity: 4--4/5 hip flexion, 4+/5 knee extension and ankle dorsiflexion/plantarflexion.  Left lower extremity: 4/5 hip flexion, 4+/5 knee extension, 4+/5 ankle dorsiflexion and plantar flexion. Skin. Warm and dry Psychiatric: Patient pleasant and appropriate.    Assessment & Plan:  77 year old right-handed male with history of hypertension, atrial fibrillation, maintained on aspirin, but stopped taking it due to nosebleeds, non-Hodgkin's lymphoma with chemotherapy and radiation, urethral strictures and multiple UTIs as well as CVA in 2010 presents for hospital follow up after receiving CIR for debility.   1. Debility secondary to rhabdomyolysis related to fall/urosepsis as well as history of CVA with right-sided residual weakness.   Follow up with therapies, instructed patient to call if still no follow up from therapies to potentially switch to a different company  2. Hypertension.    Cont meds  3. History of urethral stricture multiple dilations.    Cont follow-up urology    4. PAF:   Cont meds   5. Diabetes mellitus    Cont meds  Relatively controlled at present  6. Viral URI  Cont conservative care  7. Gait abnormality  Cont walker for safety  Cont therapies   Meds reviewed Referrals reviewed All questions answered

## 2017-09-02 ENCOUNTER — Encounter: Payer: Self-pay | Admitting: Physical Medicine & Rehabilitation

## 2017-09-02 ENCOUNTER — Encounter: Payer: Medicare Other | Attending: Physical Medicine & Rehabilitation | Admitting: Physical Medicine & Rehabilitation

## 2017-09-02 VITALS — BP 156/66 | HR 55 | Resp 14

## 2017-09-02 DIAGNOSIS — M6282 Rhabdomyolysis: Secondary | ICD-10-CM | POA: Diagnosis not present

## 2017-09-02 DIAGNOSIS — Z87891 Personal history of nicotine dependence: Secondary | ICD-10-CM | POA: Diagnosis not present

## 2017-09-02 DIAGNOSIS — Z9221 Personal history of antineoplastic chemotherapy: Secondary | ICD-10-CM | POA: Insufficient documentation

## 2017-09-02 DIAGNOSIS — R269 Unspecified abnormalities of gait and mobility: Secondary | ICD-10-CM | POA: Diagnosis not present

## 2017-09-02 DIAGNOSIS — Z8572 Personal history of non-Hodgkin lymphomas: Secondary | ICD-10-CM | POA: Insufficient documentation

## 2017-09-02 DIAGNOSIS — I69351 Hemiplegia and hemiparesis following cerebral infarction affecting right dominant side: Secondary | ICD-10-CM | POA: Insufficient documentation

## 2017-09-02 DIAGNOSIS — E119 Type 2 diabetes mellitus without complications: Secondary | ICD-10-CM | POA: Insufficient documentation

## 2017-09-02 DIAGNOSIS — Z89421 Acquired absence of other right toe(s): Secondary | ICD-10-CM | POA: Diagnosis not present

## 2017-09-02 DIAGNOSIS — Z87448 Personal history of other diseases of urinary system: Secondary | ICD-10-CM

## 2017-09-02 DIAGNOSIS — Z8249 Family history of ischemic heart disease and other diseases of the circulatory system: Secondary | ICD-10-CM | POA: Diagnosis not present

## 2017-09-02 DIAGNOSIS — R5381 Other malaise: Secondary | ICD-10-CM | POA: Diagnosis not present

## 2017-09-02 DIAGNOSIS — N35919 Unspecified urethral stricture, male, unspecified site: Secondary | ICD-10-CM | POA: Diagnosis not present

## 2017-09-02 DIAGNOSIS — Z981 Arthrodesis status: Secondary | ICD-10-CM | POA: Insufficient documentation

## 2017-09-02 DIAGNOSIS — Z923 Personal history of irradiation: Secondary | ICD-10-CM | POA: Diagnosis not present

## 2017-09-02 DIAGNOSIS — I10 Essential (primary) hypertension: Secondary | ICD-10-CM | POA: Insufficient documentation

## 2017-09-02 DIAGNOSIS — Z9181 History of falling: Secondary | ICD-10-CM | POA: Diagnosis not present

## 2017-09-02 DIAGNOSIS — J069 Acute upper respiratory infection, unspecified: Secondary | ICD-10-CM | POA: Diagnosis not present

## 2017-09-02 DIAGNOSIS — Z87440 Personal history of urinary (tract) infections: Secondary | ICD-10-CM | POA: Insufficient documentation

## 2017-09-02 DIAGNOSIS — I480 Paroxysmal atrial fibrillation: Secondary | ICD-10-CM | POA: Diagnosis not present

## 2017-09-02 DIAGNOSIS — G600 Hereditary motor and sensory neuropathy: Secondary | ICD-10-CM | POA: Insufficient documentation

## 2017-09-02 NOTE — Progress Notes (Signed)
Subjective:    Patient ID: [name], male    DOB: 1940/10/08, 77 y.o.   MRN: [id number]  HPI 77 year old right-handed male with history of hypertension, atrial fibrillation, maintained on aspirin, but stopped taking it due to nosebleeds, non-Hodgkin's lymphoma with chemotherapy and radiation, urethral strictures and multiple UTIs as well as CVA in 2010 presents for follow up for debility.   Last clinic visit 07/22/17.  Since that time, pt notes improvement.  He started and completed therapies.  His BP remains slightly elevated. Denies falls. He states he saw Urology.   Pain Inventory Average Pain 0 Pain Right Now 0 My pain is no pain  In the last 24 hours, has pain interfered with the following? General activity 0 Relation with others 0 Enjoyment of life 0 What TIME of day is your pain at its worst? no pain Sleep (in general) Good  Pain is worse with: no pain Pain improves with: no pain Relief from Meds: 0  Mobility use a cane use a walker ability to climb steps?  yes do you drive?  yes transfers alone Do you have any goals in this area?  yes  Function disabled: date disabled . retired Do you have any goals in this area?  yes  Neuro/Psych numbness tingling trouble walking dizziness  Prior Studies hospital f/u  Physicians involved in your care hospital f/u   Family History  Problem Relation Age of Onset  . Hypertension Mother   . Hypertension Father    Social History   Socioeconomic History  . Marital status: Widowed    Spouse name: None  . Number of children: None  . Years of education: None  . Highest education level: None  Social Needs  . Financial resource strain: None  . Food insecurity - worry: None  . Food insecurity - inability: None  . Transportation needs - medical: None  . Transportation needs - non-medical: None  Occupational History  . None  Tobacco Use  . Smoking status: Former Smoker    Packs/day: 2.00    Years: 35.00      Pack years: 70.00    Last attempt to quit: 06/03/1991    Years since quitting: 26.2  . Smokeless tobacco: Former Systems developer    Quit date: 06/26/1990  Substance and Sexual Activity  . Alcohol use: No  . Drug use: No  . Sexual activity: None  Other Topics Concern  . None  Social History Narrative  . None   Past Surgical History:  Procedure Laterality Date  . ANKLE FUSION Left   . CATARACT EXTRACTION    . CIRCUMCISION    . TOE AMPUTATION     right foot third toe   . URETHRA SURGERY     Past Medical History:  Diagnosis Date  . A-fib ([place])   . 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 Cataract   . Charcot-Marie-Tooth disease   . Diabetes mellitus   . Hypertension   . 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 SIRS (systemic inflammatory response syndrome) ([place]) 09/2015  . Stroke ([place])   . Weakness    BP (!) 156/66 (BP Location: Right Arm, Patient Position: Sitting, Cuff Size: Normal)   Pulse (!) 55   Resp 14   SpO2 98%   Opioid Risk Score:   Fall Risk Score:  `1  Depression screen PHQ 2/9  Depression screen [facility] 2/9 06/24/2011  Decreased Interest 0  Down, Depressed, Hopeless 0  PHQ - 2 Score 0     Review of Systems  Constitutional: Negative.   HENT: Negative.   Eyes: Negative.   Respiratory: Negative.   Cardiovascular: Positive for leg swelling.  Gastrointestinal: Positive for constipation and diarrhea.  Endocrine: Negative.        High blood sugar  Genitourinary: Negative.   Musculoskeletal: Positive for gait problem.  Skin: Negative.   Allergic/Immunologic: Negative.   Neurological: Positive for numbness.       Tingling  Hematological: Bruises/bleeds easily.  Psychiatric/Behavioral: Negative.   All other systems reviewed and are negative.     Objective:   Physical Exam Gen: NAD. Vital signs reviewed.  HENT: Normocephalic. Atraumatic.  Eyes: EOM are normal. No discharge.  Cardiovascular:  Irregularly irregular. No JVD. Respiratory: Effort normal and breath sounds normal.  GI: Bowel sounds are normal. He exhibits no distension.  Musculoskeletal: No edema. No tenderness in extremities. Neurological: He is alert and oriented Normal insight and awareness.  Motor:  Right lower extremity: 4/5 hip flexion, 4+/5 knee extension and 4/5 ankle dorsiflexion/plantarflexion.  Left lower extremity: 4+/5 hip flexion, 4+/5 knee extension, 4/5 ankle dorsiflexion and plantar flexion. Skin. Warm and dry Psychiatric: Patient pleasant and appropriate.    Assessment & Plan:  77 year old right-handed male with history of hypertension, atrial fibrillation, maintained on aspirin, but stopped taking it due to nosebleeds, non-Hodgkin's lymphoma with chemotherapy and radiation, urethral strictures and multiple UTIs as well as CVA in 2010 presents for follow up for debility.   1. Debility secondary to rhabdomyolysis related to fall/urosepsis as well as history of CVA with right-sided residual weakness.   Completed therapies, cont HEP  2. Hypertension.    Elevated today, but states WNL at home.  3. History of urethral stricture multiple dilations.    Cont follow-up urology, pt cancelled last appointment because he states he was doing well and does not need to follow up   4. PAF:   Cont meds   5. Gait abnormality  Cont cane for safety  Cont HEP

## 2018-03-04 ENCOUNTER — Ambulatory Visit: Payer: Medicare Other | Admitting: Physical Medicine & Rehabilitation

## 2019-02-08 ENCOUNTER — Encounter (HOSPITAL_COMMUNITY): Payer: Self-pay

## 2019-02-08 ENCOUNTER — Emergency Department (HOSPITAL_COMMUNITY): Payer: Medicare Other

## 2019-02-08 ENCOUNTER — Inpatient Hospital Stay (HOSPITAL_COMMUNITY)
Admission: EM | Admit: 2019-02-08 | Discharge: 2019-02-10 | DRG: 872 | Disposition: A | Discharge status: Home Health | Payer: Medicare Other | Attending: Family Medicine | Admitting: Family Medicine

## 2019-02-08 ENCOUNTER — Other Ambulatory Visit: Payer: Self-pay

## 2019-02-08 DIAGNOSIS — Z87891 Personal history of nicotine dependence: Secondary | ICD-10-CM

## 2019-02-08 DIAGNOSIS — Z20828 Contact with and (suspected) exposure to other viral communicable diseases: Secondary | ICD-10-CM | POA: Diagnosis present

## 2019-02-08 DIAGNOSIS — W010XXA Fall on same level from slipping, tripping and stumbling without subsequent striking against object, initial encounter: Secondary | ICD-10-CM | POA: Diagnosis present

## 2019-02-08 DIAGNOSIS — E1142 Type 2 diabetes mellitus with diabetic polyneuropathy: Secondary | ICD-10-CM | POA: Diagnosis present

## 2019-02-08 DIAGNOSIS — S32000A Wedge compression fracture of unspecified lumbar vertebra, initial encounter for closed fracture: Secondary | ICD-10-CM

## 2019-02-08 DIAGNOSIS — Z8673 Personal history of transient ischemic attack (TIA), and cerebral infarction without residual deficits: Secondary | ICD-10-CM

## 2019-02-08 DIAGNOSIS — I69351 Hemiplegia and hemiparesis following cerebral infarction affecting right dominant side: Secondary | ICD-10-CM

## 2019-02-08 DIAGNOSIS — Z8572 Personal history of non-Hodgkin lymphomas: Secondary | ICD-10-CM

## 2019-02-08 DIAGNOSIS — Z79899 Other long term (current) drug therapy: Secondary | ICD-10-CM

## 2019-02-08 DIAGNOSIS — Z7902 Long term (current) use of antithrombotics/antiplatelets: Secondary | ICD-10-CM

## 2019-02-08 DIAGNOSIS — I10 Essential (primary) hypertension: Secondary | ICD-10-CM | POA: Diagnosis present

## 2019-02-08 DIAGNOSIS — A419 Sepsis, unspecified organism: Principal | ICD-10-CM | POA: Diagnosis present

## 2019-02-08 DIAGNOSIS — S32030A Wedge compression fracture of third lumbar vertebra, initial encounter for closed fracture: Secondary | ICD-10-CM

## 2019-02-08 DIAGNOSIS — S32038A Other fracture of third lumbar vertebra, initial encounter for closed fracture: Secondary | ICD-10-CM | POA: Diagnosis present

## 2019-02-08 DIAGNOSIS — N390 Urinary tract infection, site not specified: Secondary | ICD-10-CM | POA: Diagnosis not present

## 2019-02-08 DIAGNOSIS — I480 Paroxysmal atrial fibrillation: Secondary | ICD-10-CM | POA: Diagnosis present

## 2019-02-08 DIAGNOSIS — Y92008 Other place in unspecified non-institutional (private) residence as the place of occurrence of the external cause: Secondary | ICD-10-CM

## 2019-02-08 DIAGNOSIS — Z7982 Long term (current) use of aspirin: Secondary | ICD-10-CM

## 2019-02-08 DIAGNOSIS — Z794 Long term (current) use of insulin: Secondary | ICD-10-CM

## 2019-02-08 LAB — CBC WITH DIFFERENTIAL/PLATELET
Abs Immature Granulocytes: 0.33 10*3/uL — ABNORMAL HIGH (ref 0.00–0.07)
Basophils Absolute: 0.1 10*3/uL (ref 0.0–0.1)
Basophils Relative: 0 %
Eosinophils Absolute: 0 10*3/uL (ref 0.0–0.5)
Eosinophils Relative: 0 %
HCT: 48.3 % (ref 39.0–52.0)
Hemoglobin: 16.2 g/dL (ref 13.0–17.0)
Immature Granulocytes: 1 %
Lymphocytes Relative: 2 %
Lymphs Abs: 0.5 10*3/uL — ABNORMAL LOW (ref 0.7–4.0)
MCH: 30.5 pg (ref 26.0–34.0)
MCHC: 33.5 g/dL (ref 30.0–36.0)
MCV: 90.8 fL (ref 80.0–100.0)
Monocytes Absolute: 1.6 10*3/uL — ABNORMAL HIGH (ref 0.1–1.0)
Monocytes Relative: 7 %
Neutro Abs: 22.3 10*3/uL — ABNORMAL HIGH (ref 1.7–7.7)
Neutrophils Relative %: 90 %
Platelets: 127 10*3/uL — ABNORMAL LOW (ref 150–400)
RBC: 5.32 MIL/uL (ref 4.22–5.81)
RDW: 12.8 % (ref 11.5–15.5)
WBC: 24.8 10*3/uL — ABNORMAL HIGH (ref 4.0–10.5)
nRBC: 0 % (ref 0.0–0.2)

## 2019-02-08 LAB — BASIC METABOLIC PANEL
Anion gap: 11 (ref 5–15)
BUN: 28 mg/dL — ABNORMAL HIGH (ref 8–23)
CO2: 23 mmol/L (ref 22–32)
Calcium: 8.6 mg/dL — ABNORMAL LOW (ref 8.9–10.3)
Chloride: 105 mmol/L (ref 98–111)
Creatinine, Ser: 1.16 mg/dL (ref 0.61–1.24)
GFR calc Af Amer: >60 mL/min (ref >60)
GFR calc non Af Amer: >60 mL/min (ref >60)
Glucose, Bld: 83 mg/dL (ref 70–99)
Potassium: 3.7 mmol/L (ref 3.5–5.1)
Sodium: 139 mmol/L (ref 135–145)

## 2019-02-08 LAB — URINALYSIS, ROUTINE W REFLEX MICROSCOPIC
Bilirubin Urine: NEGATIVE
Glucose, UA: NEGATIVE mg/dL
Ketones, ur: 20 mg/dL — AB
Nitrite: NEGATIVE
Protein, ur: 30 mg/dL — AB
Specific Gravity, Urine: 1.016 (ref 1.005–1.030)
WBC, UA: >50 WBC/hpf — ABNORMAL HIGH (ref 0–5)
pH: 5 (ref 5.0–8.0)

## 2019-02-08 MED ORDER — HYDROMORPHONE HCL 1 MG/ML IJ SOLN
0.5000 mg | INTRAMUSCULAR | Status: DC | PRN
  Administered 2019-02-08 – 2019-02-09 (×2): 0.5 mg via INTRAVENOUS
  Filled 2019-02-08 (×3): qty 1

## 2019-02-08 MED ORDER — FENTANYL CITRATE (PF) 100 MCG/2ML IJ SOLN
25.0000 ug | Freq: Once | INTRAMUSCULAR | Status: AC
  Administered 2019-02-08: 17:00:00 25 ug via INTRAVENOUS
  Filled 2019-02-08: qty 2

## 2019-02-08 MED ORDER — SODIUM CHLORIDE 0.9 % IV BOLUS
1000.0000 mL | Freq: Once | INTRAVENOUS | Status: AC
  Administered 2019-02-08: 16:00:00 1000 mL via INTRAVENOUS

## 2019-02-08 MED ORDER — FENTANYL CITRATE (PF) 100 MCG/2ML IJ SOLN: 25.0000 ug | INTRAMUSCULAR | Status: DC | PRN

## 2019-02-08 MED ORDER — SODIUM CHLORIDE 0.9 % IV BOLUS: 500.0000 mL | Freq: Once | INTRAVENOUS | Status: DC

## 2019-02-08 MED ORDER — ENOXAPARIN SODIUM 40 MG/0.4ML ~~LOC~~ SOLN: 40.0000 mg | SUBCUTANEOUS | Status: DC

## 2019-02-08 MED ORDER — SODIUM CHLORIDE 0.9 % IV SOLN
1.0000 g | Freq: Once | INTRAVENOUS | Status: AC
  Administered 2019-02-08: 19:00:00 1 g via INTRAVENOUS
  Filled 2019-02-08: qty 10

## 2019-02-08 MED ORDER — FENTANYL CITRATE (PF) 100 MCG/2ML IJ SOLN
50.0000 ug | Freq: Once | INTRAMUSCULAR | Status: AC
  Administered 2019-02-08: 18:00:00 50 ug via INTRAVENOUS
  Filled 2019-02-08: qty 2

## 2019-02-08 NOTE — ED Notes (Signed)
ED TO INPATIENT HANDOFF REPORT  ED Nurse Name and Phone #: [phone] Brittay Mogle  S Name/Age/Gender [name] 78 y.o. male Room/Bed: 041C/041C  Code Status   Code Status: Prior  Home/SNF/Other Home Patient oriented to: self, place, time and situation Is this baseline? Yes   Triage Complete: Triage complete  Chief Complaint FALL BACK PAIN  Triage Note  From home via ems; c/o mid-low back pain; pt had fall with morning while standing at toilet, became weak; no loc, did not hit head; pt on Plavix; pain 5/10  112/62 P 90 97% RA RR 16 CBG 111 Temp 99.13F   Allergies Allergies  Allergen Reactions  . Prevnar [Pneumococcal 13-Val Conj Vacc] Anaphylaxis  . Actos [Pioglitazone Hydrochloride] Other (See Comments)    Afib  . Hydrochlorothiazide Other (See Comments)    Lower potassium to low  . Penicillins Rash    Tolerated cefepime, ceftriaxone  Did it involve swelling of the face/tongue/throat, SOB, or low BP? No Did it involve sudden or severe rash/hives, skin peeling, or any reaction on the inside of your mouth or nose?Yes Did you need to seek medical attention at a hospital or doctor's office. Yes When did it last happen?78 years old If all above answers are "NO", may proceed with cephalosporin use.    Level of Care/Admitting Diagnosis ED Disposition    ED Disposition Condition Comment   Admit  Hospital Area: [facility] [[id number]]  Level of Care: Telemetry Medical [104]  I expect the patient will be discharged within 24 hours: No (not a candidate for 5C-Observation unit)  Covid Evaluation: Asymptomatic Screening Protocol (No Symptoms)  Diagnosis: Compression fracture of lumbar spine, non-traumatic, initial encounter [facility]) [970263]  Admitting Physician: Rise Patience [phone]  Attending Physician: [name]  PT Class (Do Not Modify): Observation [104]  PT Acc Code (Do Not Modify): Observation [10022]        B Medical/Surgery History Past Medical History:  Diagnosis Date  . A-fib ([place])   . 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 Cataract   . Charcot-Marie-Tooth disease   . Diabetes mellitus   . Hypertension   . 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 SIRS (systemic inflammatory response syndrome) ([place]) 09/2015  . Stroke ([place])   . Weakness    Past Surgical History:  Procedure Laterality Date  . ANKLE FUSION Left   . CATARACT EXTRACTION    . CIRCUMCISION    . TOE AMPUTATION     right foot third toe   . URETHRA SURGERY       A IV Location/Drains/Wounds Patient Lines/Drains/Airways Status   Active Line/Drains/Airways    Name:   Placement date:   Placement time:   Site:   Days:   Peripheral IV 02/08/19 Left Antecubital   02/08/19    1549    Antecubital   less than 1   Pressure Injury 06/28/17 Unstageable - Full thickness tissue loss in which the base of the ulcer is covered by slough (yellow, tan, gray, green or brown) and/or eschar (tan, brown or black) in the wound bed. Pink open blister- from patient sitting on toi   06/28/17    1727     590          Intake/Output Last 24 hours  Intake/Output Summary (Last 24 hours) at 02/08/2019 2320 Last data filed at 02/08/2019 2200 Gross per 24 hour  Intake 1100 ml  Output -  Net 1100 ml    Labs/Imaging Results for orders placed or performed during the hospital encounter of 02/08/19 (from the past 48 hour(s))  Basic metabolic panel     Status: Abnormal   Collection Time: 02/08/19 11:41 AM  Result Value Ref Range   Sodium 139 135 - 145 mmol/L   Potassium 3.7 3.5 - 5.1 mmol/L   Chloride 105 98 - 111 mmol/L   CO2 23 22 - 32 mmol/L   Glucose, Bld 83 70 - 99 mg/dL   BUN 28 (H) 8 - 23 mg/dL   Creatinine, Ser 1.16 0.61 - 1.24 mg/dL   Calcium 8.6 (L) 8.9 - 10.3 mg/dL   GFR calc non Af Amer >60 >60 mL/min   GFR calc Af Amer >60 >60 mL/min   Anion gap 11 5 - 15     Comment: Performed at [facility], [place] [address], [place], [place] [address]  CBC with Differential     Status: Abnormal   Collection Time: 02/08/19 11:41 AM  Result Value Ref Range   WBC 24.8 (H) 4.0 - 10.5 K/uL   RBC 5.32 4.22 - 5.81 MIL/uL   Hemoglobin 16.2 13.0 - 17.0 g/dL   HCT 48.3 39.0 - 52.0 %   MCV 90.8 80.0 - 100.0 fL   MCH 30.5 26.0 - 34.0 pg   MCHC 33.5 30.0 - 36.0 g/dL   RDW 12.8 11.5 - 15.5 %   Platelets 127 (L) 150 - 400 K/uL   nRBC 0.0 0.0 - 0.2 %   Neutrophils Relative % 90 %   Neutro Abs 22.3 (H) 1.7 - 7.7 K/uL   Lymphocytes Relative 2 %   Lymphs Abs 0.5 (L) 0.7 - 4.0 K/uL   Monocytes Relative 7 %   Monocytes Absolute 1.6 (H) 0.1 - 1.0 K/uL   Eosinophils Relative 0 %   Eosinophils Absolute 0.0 0.0 - 0.5 K/uL   Basophils Relative 0 %   Basophils Absolute 0.1 0.0 - 0.1 K/uL   Immature Granulocytes 1 %   Abs Immature Granulocytes 0.33 (H) 0.00 - 0.07 K/uL    Comment: Performed at [facility] [address] [place], [place] [address]  Urinalysis, Routine w reflex microscopic     Status: Abnormal   Collection Time: 02/08/19  6:22 PM  Result Value Ref Range   Color, Urine AMBER (A) YELLOW    Comment: BIOCHEMICALS MAY BE AFFECTED BY COLOR   APPearance CLOUDY (A) CLEAR   Specific Gravity, Urine 1.016 1.005 - 1.030   pH 5.0 5.0 - 8.0   Glucose, UA NEGATIVE NEGATIVE mg/dL   Hgb urine dipstick MODERATE (A) NEGATIVE   Bilirubin Urine NEGATIVE NEGATIVE   Ketones, ur 20 (A) NEGATIVE mg/dL   Protein, ur 30 (A) NEGATIVE mg/dL   Nitrite NEGATIVE NEGATIVE   Leukocytes,Ua MODERATE (A) NEGATIVE   RBC / HPF 21-50 0 - 5 RBC/hpf   WBC, UA >50 (H) 0 - 5 WBC/hpf   Bacteria, UA MANY (A) NONE SEEN   Squamous Epithelial / LPF 0-5 0 - 5   WBC Clumps PRESENT    Mucus PRESENT    Hyaline Casts, UA PRESENT     Comment: Performed at [facility], [place] [address], [place], [place] [address]   Ct Thoracic Spine Wo Contrast  Result Date: 02/08/2019 CLINICAL  DATA:  78 year old male with fall and mid to lower back pain. EXAM: CT THORACIC AND LUMBAR SPINE WITHOUT  CONTRAST TECHNIQUE: Multidetector CT imaging of the thoracic and lumbar spine was performed without contrast. Multiplanar CT image reconstructions were also generated. COMPARISON:  Chest radiograph dated 07/05/2017 and CT of the abdomen pelvis dated 10/07/2012. FINDINGS: CT THORACIC SPINE FINDINGS Alignment: Normal. Vertebrae: No acute fracture. Osteopenia. The posterior elements are intact. Paraspinal and other soft tissues: No paraspinal fluid collection or hematoma. There is atherosclerotic calcification of the visualized aorta. Coronary vascular calcifications noted. Disc levels: Multilevel degenerative changes. There is disc desiccation and vacuum phenomena primarily at T7-T10. CT LUMBAR SPINE FINDINGS Segmentation: 6 non-rib-bearing vertebra. Alignment: No acute subluxation. Vertebrae: There is nondisplaced fracture of the inferior endplate of the L3 (third non-rib-bearing vertebra). The bones are osteopenic. No other acute fracture identified. No retropulsed fragment. Paraspinal and other soft tissues: Atherosclerotic calcification of the aorta. There are multiple sigmoid diverticulosis. No paraspinal soft tissue swelling or fluid collection or hematoma. Disc levels: Multilevel degenerative disc disease. There is lower lumbar disc desiccation and vacuum phenomena. Multilevel facet arthropathy. IMPRESSION: 1. No acute/traumatic thoracic spine pathology. 2. Mild nondisplaced compression fracture of the inferior endplate of the third nonrib bearing lumbar spine vertebra. 3.  Aortic Atherosclerosis (ICD10-I70.0). Electronically Signed   By: Anner Crete M.D.   On: 02/08/2019 14:42   Ct Lumbar Spine Wo Contrast  Result Date: 02/08/2019 CLINICAL DATA:  78 year old male with fall and mid to lower back pain. EXAM: CT THORACIC AND LUMBAR SPINE WITHOUT CONTRAST TECHNIQUE: Multidetector CT imaging of the  thoracic and lumbar spine was performed without contrast. Multiplanar CT image reconstructions were also generated. COMPARISON:  Chest radiograph dated 07/05/2017 and CT of the abdomen pelvis dated 10/07/2012. FINDINGS: CT THORACIC SPINE FINDINGS Alignment: Normal. Vertebrae: No acute fracture. Osteopenia. The posterior elements are intact. Paraspinal and other soft tissues: No paraspinal fluid collection or hematoma. There is atherosclerotic calcification of the visualized aorta. Coronary vascular calcifications noted. Disc levels: Multilevel degenerative changes. There is disc desiccation and vacuum phenomena primarily at T7-T10. CT LUMBAR SPINE FINDINGS Segmentation: 6 non-rib-bearing vertebra. Alignment: No acute subluxation. Vertebrae: There is nondisplaced fracture of the inferior endplate of the L3 (third non-rib-bearing vertebra). The bones are osteopenic. No other acute fracture identified. No retropulsed fragment. Paraspinal and other soft tissues: Atherosclerotic calcification of the aorta. There are multiple sigmoid diverticulosis. No paraspinal soft tissue swelling or fluid collection or hematoma. Disc levels: Multilevel degenerative disc disease. There is lower lumbar disc desiccation and vacuum phenomena. Multilevel facet arthropathy. IMPRESSION: 1. No acute/traumatic thoracic spine pathology. 2. Mild nondisplaced compression fracture of the inferior endplate of the third nonrib bearing lumbar spine vertebra. 3.  Aortic Atherosclerosis (ICD10-I70.0). Electronically Signed   By: Anner Crete M.D.   On: 02/08/2019 14:42    Pending Labs Unresulted Labs (From admission, onward)    Start     Ordered   02/08/19 1902  SARS CORONAVIRUS 2 Nasal Swab Aptima Multi Swab  (Asymptomatic/Tier 2 Patients Labs)  Once,   STAT    Question Answer Comment  Is this test for diagnosis or screening Screening   Symptomatic for COVID-19 as defined by CDC No   Hospitalized for COVID-19 No   Admitted to ICU for  COVID-19 No   Previously tested for COVID-19 Unknown   Resident in a congregate (group) care setting No   Employed in healthcare setting No      02/08/19 1901   02/08/19 1123  Urine culture  ONCE - STAT,   STAT     02/08/19 1123   Signed  and Held  Hemoglobin A1c  Once,   R    Comments: To assess prior glycemic control    Signed and Held   Signed and Held  CBC  (enoxaparin (LOVENOX)    CrCl >/= 30 ml/min)  Once,   R    Comments: Baseline for enoxaparin therapy IF NOT ALREADY DRAWN.  Notify MD if PLT < 100 K.    Signed and Held   Signed and Held  Creatinine, serum  (enoxaparin (LOVENOX)    CrCl >/= 30 ml/min)  Once,   R    Comments: Baseline for enoxaparin therapy IF NOT ALREADY DRAWN.    Signed and Held   Signed and Held  Creatinine, serum  (enoxaparin (LOVENOX)    CrCl >/= 30 ml/min)  Weekly,   R    Comments: while on enoxaparin therapy    Signed and Held   Signed and Held  Comprehensive metabolic panel  Tomorrow morning,   R     Signed and Held   Signed and Held  CBC WITH DIFFERENTIAL  Tomorrow morning,   R     Signed and Held          Vitals/Pain Today's Vitals   02/08/19 2000 02/08/19 2100 02/08/19 2130 02/08/19 2319  BP: 140/74 (!) 128/53 (!) 127/56   Pulse: 81 83 85   Resp: (!) 22 (!) 22 20   Temp:      TempSrc:      SpO2: 97% 94% 97%   Weight:      Height:      PainSc:    4     Isolation Precautions No active isolations  Medications Medications  HYDROmorphone (DILAUDID) injection 0.5 mg (0.5 mg Intravenous Given 02/08/19 2241)  sodium chloride 0.9 % bolus 1,000 mL (0 mLs Intravenous Stopped 02/08/19 1710)  fentaNYL (SUBLIMAZE) injection 25 mcg (25 mcg Intravenous Given 02/08/19 1713)  fentaNYL (SUBLIMAZE) injection 50 mcg (50 mcg Intravenous Given 02/08/19 1823)  cefTRIAXone (ROCEPHIN) 1 g in sodium chloride 0.9 % 100 mL IVPB (0 g Intravenous Stopped 02/08/19 2200)    Mobility walks with device High fall risk   Focused  Assessments Musculoskeletal   R Recommendations: See Admitting Provider Note  Report given to:   Additional Notes: Pt is A&Ox4.  C/O back pain.  Has very little urine output.  Bedrest due to fracture in spine.  Dilaudid 0.5mg  given with relief

## 2019-02-08 NOTE — ED Triage Notes (Signed)
From home via ems; c/o mid-low back pain; pt had fall with morning while standing at toilet, became weak; no loc, did not hit head; pt on Plavix; pain 5/10  112/62 P 90 97% RA RR 16 CBG 111 Temp 99.78F

## 2019-02-08 NOTE — ED Provider Notes (Signed)
[facility] EMERGENCY DEPARTMENT Provider Note   CSN: [id number] Arrival date & time: 02/08/19  1057    History   Chief Complaint Chief Complaint  Patient presents with   Back Pain    HPI MUHANNAD BIGNELL is a 78 y.o. male with past medical history of paroxysmal atrial fibrillation, insulin-dependent type 2 diabetes, stroke with residual right-sided weakness, hypertension, B-cell lymphoma, presenting to the emergency department via EMS after a fall that occurred prior to arrival.  Patient states he was up to use the restroom when he lost his balance and fell down onto his bottom and then back onto his back.  He endorses midline low back pain that is worse with movement.  He states he was unable to get up on his own, however was able to call out for help.  Only on the ground for about 30 minutes.  He states this morning he was feeling some generalized weakness and dizziness, with some associated dysuria.  He states he is felt similar symptoms in the past when he had a UTI.  He did not hit his head or pass out.  He has no other injuries or complaints.  He is on Plavix.  His pain is minimal right now 4/10 severity.  He denies any new numbness or weakness of extremities, saddle paresthesias.  He endorses chronic diabetic neuropathy and has decreased sensation to the lower legs bilaterally.    The history is provided by the patient.    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/2010; xrt comp 03/2010   SIRS (systemic inflammatory response syndrome) ([place]) 09/2015   Stroke ([place])    Weakness     Patient Active Problem List   Diagnosis Date Noted   Laryngitis    Acute non-recurrent sinusitis    Sore throat    Cough    Overflow incontinence of  urine    Urinary retention    Acute lower UTI    Urinary urgency    Hypoalbuminemia due to protein-calorie malnutrition ([place])    Type 2 diabetes mellitus with peripheral neuropathy (HCC)    Benign essential HTN    H/O urethral stricture    Acute blood loss anemia    Poorly controlled type 2 diabetes mellitus with peripheral neuropathy ([place])    Debility 06/28/2017   Personal history of urethral stricture 06/23/2017   Rhabdomyolysis 06/23/2017   Fever 09/23/2015   SIRS (systemic inflammatory response syndrome) ([place]) 09/23/2015   Near syncope 09/23/2015   PAF (paroxysmal atrial fibrillation) ([place]) 09/23/2015   Diabetes mellitus type 2, uncontrolled ([place]) 09/23/2015   Stenosis of artery (HCC)    Vertebrobasilar artery syndrome    Orthostatic hypotension    Insulin dependent diabetes mellitus ([place])    Falls frequently    Weakness 08/15/2015   Cellulitis of right upper arm 06/02/2014   Blood poisoning    Encephalopathy 05/29/2014   Sepsis ([place]) 05/29/2014   UTI (lower urinary tract infection) 09/16/2013   Recurrent falls 09/16/2013   History of CVA (cerebrovascular accident) 09/16/2013   Leukocytosis 09/16/2013   Thrombocytopenia ([facility]) 09/16/2013   B-cell lymphoma ([place]) 10/09/2011   Diabetes mellitus ([place]) 10/09/2011   Hypertension 10/09/2011   A-fib ([place])  Past Surgical History:  Procedure Laterality Date   ANKLE FUSION Left    CATARACT EXTRACTION     CIRCUMCISION     TOE AMPUTATION     right foot third toe    URETHRA SURGERY          Home Medications    Prior to Admission medications   Medication Sig Start Date End Date Taking? Authorizing Provider  amLODipine (NORVASC) 10 MG tablet Take 1 tablet (10 mg total) by mouth daily. Patient taking differently: Take 5 mg by mouth 2 (two) times daily.  07/09/17   Angiulli, Lavon Paganini, PA-C  aspirin EC 81 MG EC tablet Take 1 tablet (81 mg total) by mouth daily. Patient taking  differently: Take 81 mg by mouth 3 (three) times a week. Monday, Wednesday and Friday 08/19/15   Florencia Reasons, MD  clopidogrel (PLAVIX) 75 MG tablet Take 1 tablet (75 mg total) by mouth daily. 07/09/17   Angiulli, Lavon Paganini, PA-C  insulin glargine (LANTUS) 100 unit/mL SOPN Inject 0.12 mLs (12 Units total) into the skin daily. 07/09/17   Angiulli, Lavon Paganini, PA-C  lisinopril (PRINIVIL,ZESTRIL) 2.5 MG tablet Take 3 tablets (7.5 mg total) by mouth daily. 07/09/17   Angiulli, Lavon Paganini, PA-C  metoprolol tartrate (LOPRESSOR) 25 MG tablet Take 1 tablet (25 mg total) by mouth 2 (two) times daily. 07/09/17   Angiulli, Lavon Paganini, PA-C  simvastatin (ZOCOR) 20 MG tablet Take 1 tablet (20 mg total) by mouth at bedtime. 07/09/17   Angiulli, Lavon Paganini, PA-C  tamsulosin (FLOMAX) 0.4 MG CAPS capsule Take 2 capsules (0.8 mg total) by mouth daily. 07/09/17   Angiulli, Lavon Paganini, PA-C    Family History Family History  Problem Relation Age of Onset   Hypertension Mother    Hypertension Father     Social History Social History   Tobacco Use   Smoking status: Former Smoker    Packs/day: 2.00    Years: 35.00    Pack years: 70.00    Quit date: 06/03/1991    Years since quitting: 27.7   Smokeless tobacco: Former Systems developer    Quit date: 06/26/1990  Substance Use Topics   Alcohol use: No   Drug use: No     Allergies   Prevnar [pneumococcal 13-val conj vacc], Actos [pioglitazone hydrochloride], Hydrochlorothiazide, and Penicillins   Review of Systems Review of Systems  Constitutional: Positive for chills. Negative for fever.  Respiratory: Negative for cough.   Gastrointestinal: Negative for abdominal pain.  Genitourinary: Positive for dysuria. Negative for frequency.  Musculoskeletal: Positive for back pain.  Neurological: Positive for weakness (generalized, no unilateral weakness).  Psychiatric/Behavioral: Negative for confusion.  All other systems reviewed and are negative.    Physical Exam Updated Vital  Signs BP (!) 111/54    Pulse 80    Temp 98.3 F (36.8 C) (Oral)    Resp (!) 29    Ht 5\' 9"  (1.753 m)    Wt 77.1 kg    SpO2 96%    BMI 25.10 kg/m   Physical Exam Vitals signs and nursing note reviewed.  Constitutional:      General: He is not in acute distress.    Appearance: He is well-developed.  HENT:     Head: Normocephalic and atraumatic.  Eyes:     Conjunctiva/sclera: Conjunctivae normal.  Cardiovascular:     Rate and Rhythm: Normal rate and regular rhythm.  Pulmonary:     Effort: Pulmonary effort is normal. No respiratory distress.  Breath sounds: Normal breath sounds.  Abdominal:     General: Bowel sounds are normal.     Palpations: Abdomen is soft.     Tenderness: There is no abdominal tenderness. There is no guarding or rebound.  Musculoskeletal:     Comments: Midline upper L-spine tenderness, no bruising, no bony step-offs or gross deformities. Pt actively moving both LE without difficulty, pt flexing hips and knees during evaluation to 90 deg.   Skin:    General: Skin is warm.     Comments: Chronic skin changes to lower extremities.  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  Neurological:     Mental Status: He is alert.  Psychiatric:        Behavior: Behavior normal.      ED Treatments / Results  Labs (all labs ordered are listed, but only abnormal results are displayed) Labs Reviewed  BASIC METABOLIC PANEL - Abnormal; Notable for the following components:      Result Value   BUN 28 (*)    Calcium 8.6 (*)    All other components within normal limits  CBC WITH DIFFERENTIAL/PLATELET - Abnormal; Notable for the following components:   WBC 24.8 (*)    Platelets 127 (*)    Neutro Abs 22.3 (*)    Lymphs Abs 0.5 (*)    Monocytes Absolute 1.6 (*)    Abs Immature Granulocytes 0.33 (*)    All other components within normal limits  URINALYSIS, ROUTINE W REFLEX MICROSCOPIC - Abnormal; Notable for the following components:   Color, Urine AMBER (*)     APPearance CLOUDY (*)    Hgb urine dipstick MODERATE (*)    Ketones, ur 20 (*)    Protein, ur 30 (*)    Leukocytes,Ua MODERATE (*)    WBC, UA >50 (*)    Bacteria, UA MANY (*)    All other components within normal limits  URINE CULTURE  SARS CORONAVIRUS 2    EKG None  Radiology Ct Thoracic Spine Wo Contrast  Result Date: 02/08/2019 CLINICAL DATA:  78 year old male with fall and mid to lower back pain. EXAM: CT THORACIC AND LUMBAR SPINE WITHOUT CONTRAST TECHNIQUE: Multidetector CT imaging of the thoracic and lumbar spine was performed without contrast. Multiplanar CT image reconstructions were also generated. COMPARISON:  Chest radiograph dated 07/05/2017 and CT of the abdomen pelvis dated 10/07/2012. FINDINGS: CT THORACIC SPINE FINDINGS Alignment: Normal. Vertebrae: No acute fracture. Osteopenia. The posterior elements are intact. Paraspinal and other soft tissues: No paraspinal fluid collection or hematoma. There is atherosclerotic calcification of the visualized aorta. Coronary vascular calcifications noted. Disc levels: Multilevel degenerative changes. There is disc desiccation and vacuum phenomena primarily at T7-T10. CT LUMBAR SPINE FINDINGS Segmentation: 6 non-rib-bearing vertebra. Alignment: No acute subluxation. Vertebrae: There is nondisplaced fracture of the inferior endplate of the L3 (third non-rib-bearing vertebra). The bones are osteopenic. No other acute fracture identified. No retropulsed fragment. Paraspinal and other soft tissues: Atherosclerotic calcification of the aorta. There are multiple sigmoid diverticulosis. No paraspinal soft tissue swelling or fluid collection or hematoma. Disc levels: Multilevel degenerative disc disease. There is lower lumbar disc desiccation and vacuum phenomena. Multilevel facet arthropathy. IMPRESSION: 1. No acute/traumatic thoracic spine pathology. 2. Mild nondisplaced compression fracture of the inferior endplate of the third nonrib bearing lumbar  spine vertebra. 3.  Aortic Atherosclerosis (ICD10-I70.0). Electronically Signed   By: Anner Crete M.D.   On: 02/08/2019 14:42   Ct Lumbar Spine Wo Contrast  Result Date: 02/08/2019  CLINICAL DATA:  78 year old male with fall and mid to lower back pain. EXAM: CT THORACIC AND LUMBAR SPINE WITHOUT CONTRAST TECHNIQUE: Multidetector CT imaging of the thoracic and lumbar spine was performed without contrast. Multiplanar CT image reconstructions were also generated. COMPARISON:  Chest radiograph dated 07/05/2017 and CT of the abdomen pelvis dated 10/07/2012. FINDINGS: CT THORACIC SPINE FINDINGS Alignment: Normal. Vertebrae: No acute fracture. Osteopenia. The posterior elements are intact. Paraspinal and other soft tissues: No paraspinal fluid collection or hematoma. There is atherosclerotic calcification of the visualized aorta. Coronary vascular calcifications noted. Disc levels: Multilevel degenerative changes. There is disc desiccation and vacuum phenomena primarily at T7-T10. CT LUMBAR SPINE FINDINGS Segmentation: 6 non-rib-bearing vertebra. Alignment: No acute subluxation. Vertebrae: There is nondisplaced fracture of the inferior endplate of the L3 (third non-rib-bearing vertebra). The bones are osteopenic. No other acute fracture identified. No retropulsed fragment. Paraspinal and other soft tissues: Atherosclerotic calcification of the aorta. There are multiple sigmoid diverticulosis. No paraspinal soft tissue swelling or fluid collection or hematoma. Disc levels: Multilevel degenerative disc disease. There is lower lumbar disc desiccation and vacuum phenomena. Multilevel facet arthropathy. IMPRESSION: 1. No acute/traumatic thoracic spine pathology. 2. Mild nondisplaced compression fracture of the inferior endplate of the third nonrib bearing lumbar spine vertebra. 3.  Aortic Atherosclerosis (ICD10-I70.0). Electronically Signed   By: Anner Crete M.D.   On: 02/08/2019 14:42    Procedures Procedures  (including critical care time)  Medications Ordered in ED Medications  sodium chloride 0.9 % bolus 1,000 mL (0 mLs Intravenous Stopped 02/08/19 1710)  fentaNYL (SUBLIMAZE) injection 25 mcg (25 mcg Intravenous Given 02/08/19 1713)  fentaNYL (SUBLIMAZE) injection 50 mcg (50 mcg Intravenous Given 02/08/19 1823)  cefTRIAXone (ROCEPHIN) 1 g in sodium chloride 0.9 % 100 mL IVPB (1 g Intravenous New Bag/Given 02/08/19 1921)     Initial Impression / Assessment and Plan / ED Course  I have reviewed the triage vital signs and the nursing notes.  Pertinent labs & imaging results that were available during my care of the patient were reviewed by me and considered in my medical decision making (see chart for details).        Patient presenting with a fall due to generalized weakness today.  He fell back onto his bottom and has sudden onset of low back pain that is worse with movement.  No new neuro deficits.  He does have urinary symptoms consistent with prior UTI.  Labs reveal significant leukocytosis of 24.8.  His urine shows large signs of infection, culture sent.  Given a dose of IV Rocephin.  His CT scan shows a new L3 compression fracture.  His pain is treated, progressively worsening with movement.  Given patient's generalized weakness causing fall with UTI and large leukocytosis and new compression fracture, will admit to medicine.  Final Clinical Impressions(s) / ED Diagnoses   Final diagnoses:  Acute lower UTI  Closed compression fracture of L3 lumbar vertebra, initial encounter [facility])    ED Discharge Orders    None       Ulyana Pitones, [place] N, PA-C 02/08/19 2136    Veryl Speak, MD 02/11/19 [phone]

## 2019-02-08 NOTE — H&P (Signed)
History and Physical    [name] UXN:[id number] DOB: 30-Aug-1940 DOA: 02/08/2019  PCP: Alroy Dust, L.Marlou Sa, MD  Patient coming from: Home.  Chief Complaint: Fall and back pain.  HPI: [name] is a 78 y.o. male with history of diabetes mellitus type 2, hypertension, lymphoma in remission, A. fib not on anticoagulation with history of stroke with right-sided weakness was brought to the ER after patient had a fall at home.  Patient states he has been feeling weak for last 2 days and had gone to the bathroom and use the commode.  Following which he came back to the room when he suddenly fell but did not hit his head or lose consciousness.  Did not have any chest pain or shortness of breath.  He had called his daughter for help and patient was brought to the ER.  ED Course: In the ER patient was complaining of low back pain and had a CT of the T-spine and L-spine which showed L3 compression fracture.  Given the pain patient has been admitted for further management.  Patient was febrile in the ER with temperature 102 F with leukocytosis.  UA is compatible UTI patient does have some difficulty urination for many weeks now.  Cultures obtained started on empiric antibiotics for UTI.  Review of Systems: As per HPI, rest all negative.   Past Medical History:  Diagnosis Date  . A-fib ([place])   . 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 Cataract   . Charcot-Marie-Tooth disease   . Diabetes mellitus   . Hypertension   . 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 SIRS (systemic inflammatory response syndrome) ([place]) 09/2015  . Stroke ([place])   . Weakness     Past Surgical History:  Procedure Laterality Date  . ANKLE FUSION Left   . CATARACT EXTRACTION    . CIRCUMCISION    . TOE AMPUTATION     right foot third toe   . URETHRA SURGERY       reports that he quit smoking about 27 years ago. He has a 70.00 pack-year smoking  history. He quit smokeless tobacco use about 28 years ago. He reports that he does not drink alcohol or use drugs.  Allergies  Allergen Reactions  . Prevnar [Pneumococcal 13-Val Conj Vacc] Anaphylaxis  . Actos [Pioglitazone Hydrochloride] Other (See Comments)    Afib  . Hydrochlorothiazide Other (See Comments)    Lower potassium to low  . Penicillins Rash    Tolerated cefepime, ceftriaxone  Did it involve swelling of the face/tongue/throat, SOB, or low BP? No Did it involve sudden or severe rash/hives, skin peeling, or any reaction on the inside of your mouth or nose?Yes Did you need to seek medical attention at a hospital or doctor's office. Yes When did it last happen?78 years old If all above answers are "NO", may proceed with cephalosporin use.    Family History  Problem Relation Age of Onset  . Hypertension Mother   . Hypertension Father     Prior to Admission medications   Medication Sig Start Date End Date Taking? Authorizing Provider  amLODipine (NORVASC) 10 MG tablet Take 1 tablet (10 mg total) by mouth daily. Patient taking differently: Take 5 mg by mouth 2 (two) times daily.  07/09/17  Yes Angiulli, Lavon Paganini, PA-C  aspirin EC 81 MG EC tablet Take 1 tablet (81 mg total) by mouth daily.  Patient taking differently: Take 81 mg by mouth 3 (three) times a week. Monday, Wednesday and Friday 08/19/15  Yes Florencia Reasons, MD  Insulin Degludec (TRESIBA) 100 UNIT/ML SOLN Inject 18 Units into the skin daily.   Yes [provider]  lisinopril (PRINIVIL,ZESTRIL) 2.5 MG tablet Take 3 tablets (7.5 mg total) by mouth daily. Patient taking differently: Take 2.5 mg by mouth 2 (two) times daily.  07/09/17  Yes Angiulli, Lavon Paganini, PA-C  metoprolol tartrate (LOPRESSOR) 25 MG tablet Take 1 tablet (25 mg total) by mouth 2 (two) times daily. Patient taking differently: Take 12.5 mg by mouth 2 (two) times daily.  07/09/17  Yes Angiulli, Lavon Paganini, PA-C  simvastatin (ZOCOR) 20 MG tablet Take  1 tablet (20 mg total) by mouth at bedtime. 07/09/17  Yes Angiulli, Lavon Paganini, PA-C    Physical Exam: Constitutional: Moderately built and nourished. Vitals:   02/08/19 1930 02/08/19 2000 02/08/19 2100 02/08/19 2130  BP: (!) 132/57 140/74 (!) 128/53 (!) 127/56  Pulse: 82 81 83 85  Resp: (!) 21 (!) 22 (!) 22 20  Temp:      TempSrc:      SpO2: 96% 97% 94% 97%  Weight:      Height:       Eyes: Anicteric no pallor. ENMT: No discharge from the ears eyes nose or mouth. Neck: No mass felt.  No neck rigidity. Respiratory: No rhonchi or crepitations. Cardiovascular: S1-S2 heard. Abdomen: Soft nontender bowel sounds present. Musculoskeletal: No edema. Skin: No rash. Neurologic: Alert awake oriented to time place and person.  Has mild weakness of the right side from previous stroke. Psychiatric: Appears normal per normal affect.   Labs on Admission: I have personally reviewed following labs and imaging studies  CBC: Recent Labs  Lab 02/08/19 1141  WBC 24.8*  NEUTROABS 22.3*  HGB 16.2  HCT 48.3  MCV 90.8  PLT 716*   Basic Metabolic Panel: Recent Labs  Lab 02/08/19 1141  NA 139  K 3.7  CL 105  CO2 23  GLUCOSE 83  BUN 28*  CREATININE 1.16  CALCIUM 8.6*   GFR: Estimated Creatinine Clearance: 53.3 mL/min (by C-G formula based on SCr of 1.16 mg/dL). Liver Function Tests: No results for input(s): AST, ALT, ALKPHOS, BILITOT, PROT, ALBUMIN in the last 168 hours. No results for input(s): LIPASE, AMYLASE in the last 168 hours. No results for input(s): AMMONIA in the last 168 hours. Coagulation Profile: No results for input(s): INR, PROTIME in the last 168 hours. Cardiac Enzymes: No results for input(s): CKTOTAL, CKMB, CKMBINDEX, TROPONINI in the last 168 hours. BNP (last 3 results) No results for input(s): PROBNP in the last 8760 hours. HbA1C: No results for input(s): HGBA1C in the last 72 hours. CBG: No results for input(s): GLUCAP in the last 168 hours. Lipid Profile:  No results for input(s): CHOL, HDL, LDLCALC, TRIG, CHOLHDL, LDLDIRECT in the last 72 hours. Thyroid Function Tests: No results for input(s): TSH, T4TOTAL, FREET4, T3FREE, THYROIDAB in the last 72 hours. Anemia Panel: No results for input(s): VITAMINB12, FOLATE, FERRITIN, TIBC, IRON, RETICCTPCT in the last 72 hours. Urine analysis:    Component Value Date/Time   COLORURINE AMBER (A) 02/08/2019 1822   APPEARANCEUR CLOUDY (A) 02/08/2019 1822   LABSPEC 1.016 02/08/2019 1822   PHURINE 5.0 02/08/2019 1822   GLUCOSEU NEGATIVE 02/08/2019 1822   HGBUR MODERATE (A) 02/08/2019 1822   BILIRUBINUR NEGATIVE 02/08/2019 1822   KETONESUR 20 (A) 02/08/2019 1822   PROTEINUR 30 (A) 02/08/2019 1822  UROBILINOGEN 0.2 05/29/2014 2125   NITRITE NEGATIVE 02/08/2019 1822   LEUKOCYTESUR MODERATE (A) 02/08/2019 1822   Sepsis Labs: @LABRCNTIP (procalcitonin:4,lacticidven:4) )No results found for this or any previous visit (from the past 240 hour(s)).   Radiological Exams on Admission: Ct Thoracic Spine Wo Contrast  Result Date: 02/08/2019 CLINICAL DATA:  78 year old male with fall and mid to lower back pain. EXAM: CT THORACIC AND LUMBAR SPINE WITHOUT CONTRAST TECHNIQUE: Multidetector CT imaging of the thoracic and lumbar spine was performed without contrast. Multiplanar CT image reconstructions were also generated. COMPARISON:  Chest radiograph dated 07/05/2017 and CT of the abdomen pelvis dated 10/07/2012. FINDINGS: CT THORACIC SPINE FINDINGS Alignment: Normal. Vertebrae: No acute fracture. Osteopenia. The posterior elements are intact. Paraspinal and other soft tissues: No paraspinal fluid collection or hematoma. There is atherosclerotic calcification of the visualized aorta. Coronary vascular calcifications noted. Disc levels: Multilevel degenerative changes. There is disc desiccation and vacuum phenomena primarily at T7-T10. CT LUMBAR SPINE FINDINGS Segmentation: 6 non-rib-bearing vertebra. Alignment: No acute  subluxation. Vertebrae: There is nondisplaced fracture of the inferior endplate of the L3 (third non-rib-bearing vertebra). The bones are osteopenic. No other acute fracture identified. No retropulsed fragment. Paraspinal and other soft tissues: Atherosclerotic calcification of the aorta. There are multiple sigmoid diverticulosis. No paraspinal soft tissue swelling or fluid collection or hematoma. Disc levels: Multilevel degenerative disc disease. There is lower lumbar disc desiccation and vacuum phenomena. Multilevel facet arthropathy. IMPRESSION: 1. No acute/traumatic thoracic spine pathology. 2. Mild nondisplaced compression fracture of the inferior endplate of the third nonrib bearing lumbar spine vertebra. 3.  Aortic Atherosclerosis (ICD10-I70.0). Electronically Signed   By: Anner Crete M.D.   On: 02/08/2019 14:42   Ct Lumbar Spine Wo Contrast  Result Date: 02/08/2019 CLINICAL DATA:  78 year old male with fall and mid to lower back pain. EXAM: CT THORACIC AND LUMBAR SPINE WITHOUT CONTRAST TECHNIQUE: Multidetector CT imaging of the thoracic and lumbar spine was performed without contrast. Multiplanar CT image reconstructions were also generated. COMPARISON:  Chest radiograph dated 07/05/2017 and CT of the abdomen pelvis dated 10/07/2012. FINDINGS: CT THORACIC SPINE FINDINGS Alignment: Normal. Vertebrae: No acute fracture. Osteopenia. The posterior elements are intact. Paraspinal and other soft tissues: No paraspinal fluid collection or hematoma. There is atherosclerotic calcification of the visualized aorta. Coronary vascular calcifications noted. Disc levels: Multilevel degenerative changes. There is disc desiccation and vacuum phenomena primarily at T7-T10. CT LUMBAR SPINE FINDINGS Segmentation: 6 non-rib-bearing vertebra. Alignment: No acute subluxation. Vertebrae: There is nondisplaced fracture of the inferior endplate of the L3 (third non-rib-bearing vertebra). The bones are osteopenic. No other  acute fracture identified. No retropulsed fragment. Paraspinal and other soft tissues: Atherosclerotic calcification of the aorta. There are multiple sigmoid diverticulosis. No paraspinal soft tissue swelling or fluid collection or hematoma. Disc levels: Multilevel degenerative disc disease. There is lower lumbar disc desiccation and vacuum phenomena. Multilevel facet arthropathy. IMPRESSION: 1. No acute/traumatic thoracic spine pathology. 2. Mild nondisplaced compression fracture of the inferior endplate of the third nonrib bearing lumbar spine vertebra. 3.  Aortic Atherosclerosis (ICD10-I70.0). Electronically Signed   By: Anner Crete M.D.   On: 02/08/2019 14:42     Assessment/Plan Principal Problem:   Lumbar compression fracture (HCC) Active Problems:   History of CVA (cerebrovascular accident)   PAF (paroxysmal atrial fibrillation) (HCC)   Type 2 diabetes mellitus with peripheral neuropathy (HCC)   Compression fracture of lumbar spine, non-traumatic, initial encounter ([place])    1. Lumbar compression fracture involving the L3 status post fall -we  will keep patient on pain relief medications.  Consult orthopedics in the morning.  May need brace. 2. UTI on ceftriaxone follow cultures. 3. Leukocytosis likely from UTI follow cultures and CBC. 4. Diabetes mellitus type 2 on Tresiba insulin.  Place patient on sliding scale coverage. 5. Hypertension on Norvasc metoprolol and lisinopril. 6. History of proximal atrial fibrillation per the chart. 7. History of stroke with right-sided weakness on antiplatelet agents and statins.  Medications confirmed with patient's daughter.  DVT prophylaxis: SCDs for now until we can opinion from orthopedics. Code Status: Full code. Family Communication: Patient's daughter. Disposition Plan: To be determined. Consults called: Physical therapy. Admission status: Observation.   Rise Patience MD [facility] Pager [phone].  If 7PM-7AM,  please contact night-coverage [email or web address] Password [facility]  02/08/2019, 10:28 PM

## 2019-02-09 DIAGNOSIS — I480 Paroxysmal atrial fibrillation: Secondary | ICD-10-CM | POA: Diagnosis present

## 2019-02-09 DIAGNOSIS — N390 Urinary tract infection, site not specified: Secondary | ICD-10-CM | POA: Diagnosis present

## 2019-02-09 DIAGNOSIS — I69351 Hemiplegia and hemiparesis following cerebral infarction affecting right dominant side: Secondary | ICD-10-CM | POA: Diagnosis not present

## 2019-02-09 DIAGNOSIS — I10 Essential (primary) hypertension: Secondary | ICD-10-CM | POA: Diagnosis present

## 2019-02-09 DIAGNOSIS — Z7982 Long term (current) use of aspirin: Secondary | ICD-10-CM | POA: Diagnosis not present

## 2019-02-09 DIAGNOSIS — Z79899 Other long term (current) drug therapy: Secondary | ICD-10-CM | POA: Diagnosis not present

## 2019-02-09 DIAGNOSIS — Y92008 Other place in unspecified non-institutional (private) residence as the place of occurrence of the external cause: Secondary | ICD-10-CM | POA: Diagnosis not present

## 2019-02-09 DIAGNOSIS — Z87891 Personal history of nicotine dependence: Secondary | ICD-10-CM | POA: Diagnosis not present

## 2019-02-09 DIAGNOSIS — S32038A Other fracture of third lumbar vertebra, initial encounter for closed fracture: Secondary | ICD-10-CM | POA: Diagnosis present

## 2019-02-09 DIAGNOSIS — E1142 Type 2 diabetes mellitus with diabetic polyneuropathy: Secondary | ICD-10-CM | POA: Diagnosis present

## 2019-02-09 DIAGNOSIS — Z7902 Long term (current) use of antithrombotics/antiplatelets: Secondary | ICD-10-CM | POA: Diagnosis not present

## 2019-02-09 DIAGNOSIS — Z20828 Contact with and (suspected) exposure to other viral communicable diseases: Secondary | ICD-10-CM | POA: Diagnosis present

## 2019-02-09 DIAGNOSIS — A419 Sepsis, unspecified organism: Secondary | ICD-10-CM | POA: Diagnosis present

## 2019-02-09 DIAGNOSIS — Z8572 Personal history of non-Hodgkin lymphomas: Secondary | ICD-10-CM | POA: Diagnosis not present

## 2019-02-09 DIAGNOSIS — S32030A Wedge compression fracture of third lumbar vertebra, initial encounter for closed fracture: Secondary | ICD-10-CM | POA: Diagnosis not present

## 2019-02-09 DIAGNOSIS — W010XXA Fall on same level from slipping, tripping and stumbling without subsequent striking against object, initial encounter: Secondary | ICD-10-CM | POA: Diagnosis present

## 2019-02-09 DIAGNOSIS — Z794 Long term (current) use of insulin: Secondary | ICD-10-CM | POA: Diagnosis not present

## 2019-02-09 LAB — COMPREHENSIVE METABOLIC PANEL
ALT: 29 U/L (ref 0–44)
AST: 48 U/L — ABNORMAL HIGH (ref 15–41)
Albumin: 2.9 g/dL — ABNORMAL LOW (ref 3.5–5.0)
Alkaline Phosphatase: 65 U/L (ref 38–126)
Anion gap: 12 (ref 5–15)
BUN: 37 mg/dL — ABNORMAL HIGH (ref 8–23)
CO2: 22 mmol/L (ref 22–32)
Calcium: 8.4 mg/dL — ABNORMAL LOW (ref 8.9–10.3)
Chloride: 105 mmol/L (ref 98–111)
Creatinine, Ser: 1.23 mg/dL (ref 0.61–1.24)
GFR calc Af Amer: >60 mL/min (ref >60)
GFR calc non Af Amer: 56 mL/min — ABNORMAL LOW (ref >60)
Glucose, Bld: 92 mg/dL (ref 70–99)
Potassium: 5.1 mmol/L (ref 3.5–5.1)
Sodium: 139 mmol/L (ref 135–145)
Total Bilirubin: 0.7 mg/dL (ref 0.3–1.2)
Total Protein: 5.8 g/dL — ABNORMAL LOW (ref 6.5–8.1)

## 2019-02-09 LAB — GLUCOSE, CAPILLARY
Glucose-Capillary: 73 mg/dL (ref 70–99)
Glucose-Capillary: 161 mg/dL — ABNORMAL HIGH (ref 70–99)
Glucose-Capillary: 134 mg/dL — ABNORMAL HIGH (ref 70–99)
Glucose-Capillary: 152 mg/dL — ABNORMAL HIGH (ref 70–99)

## 2019-02-09 LAB — CBC WITH DIFFERENTIAL/PLATELET
Abs Immature Granulocytes: 0.2 10*3/uL — ABNORMAL HIGH (ref 0.00–0.07)
Basophils Absolute: 0.1 10*3/uL (ref 0.0–0.1)
Basophils Relative: 0 %
Eosinophils Absolute: 0.1 10*3/uL (ref 0.0–0.5)
Eosinophils Relative: 0 %
HCT: 46.2 % (ref 39.0–52.0)
Hemoglobin: 15.7 g/dL (ref 13.0–17.0)
Immature Granulocytes: 1 %
Lymphocytes Relative: 2 %
Lymphs Abs: 0.6 10*3/uL — ABNORMAL LOW (ref 0.7–4.0)
MCH: 30.7 pg (ref 26.0–34.0)
MCHC: 34 g/dL (ref 30.0–36.0)
MCV: 90.2 fL (ref 80.0–100.0)
Monocytes Absolute: 1.4 10*3/uL — ABNORMAL HIGH (ref 0.1–1.0)
Monocytes Relative: 6 %
Neutro Abs: 21.3 10*3/uL — ABNORMAL HIGH (ref 1.7–7.7)
Neutrophils Relative %: 91 %
Platelets: 124 10*3/uL — ABNORMAL LOW (ref 150–400)
RBC: 5.12 MIL/uL (ref 4.22–5.81)
RDW: 13 % (ref 11.5–15.5)
WBC: 23.6 10*3/uL — ABNORMAL HIGH (ref 4.0–10.5)
nRBC: 0 % (ref 0.0–0.2)

## 2019-02-09 LAB — HEMOGLOBIN A1C
Hgb A1c MFr Bld: 7.6 % — ABNORMAL HIGH (ref 4.8–5.6)
Mean Plasma Glucose: 171.42 mg/dL

## 2019-02-09 LAB — SARS CORONAVIRUS 2 (TAT 6-24 HRS): SARS Coronavirus 2: NEGATIVE

## 2019-02-09 LAB — MRSA PCR SCREENING

## 2019-02-09 MED ORDER — ONDANSETRON HCL 4 MG PO TABS: 4.0000 mg | ORAL_TABLET | Freq: Four times a day (QID) | ORAL | Status: DC | PRN

## 2019-02-09 MED ORDER — ONDANSETRON HCL 4 MG/2ML IJ SOLN: 4.0000 mg | Freq: Four times a day (QID) | INTRAMUSCULAR | Status: DC | PRN

## 2019-02-09 MED ORDER — SENNA 8.6 MG PO TABS
1.0000 | ORAL_TABLET | Freq: Every day | ORAL | Status: DC
  Administered 2019-02-09: 8.6 mg via ORAL
  Filled 2019-02-09 (×2): qty 1

## 2019-02-09 MED ORDER — AMLODIPINE BESYLATE 5 MG PO TABS
5.0000 mg | ORAL_TABLET | Freq: Two times a day (BID) | ORAL | Status: DC
  Administered 2019-02-09 – 2019-02-10 (×3): 5 mg via ORAL
  Filled 2019-02-09 (×3): qty 1

## 2019-02-09 MED ORDER — LIDOCAINE VISCOUS HCL 2 % MT SOLN
15.0000 mL | Freq: Once | OROMUCOSAL | Status: DC
  Filled 2019-02-09 (×2): qty 15

## 2019-02-09 MED ORDER — INSULIN GLARGINE 100 UNIT/ML ~~LOC~~ SOLN
18.0000 [IU] | Freq: Every day | SUBCUTANEOUS | Status: DC
  Administered 2019-02-09 – 2019-02-10 (×2): 18 [IU] via SUBCUTANEOUS
  Filled 2019-02-09 (×2): qty 0.18

## 2019-02-09 MED ORDER — ACETAMINOPHEN 325 MG PO TABS
650.0000 mg | ORAL_TABLET | Freq: Four times a day (QID) | ORAL | Status: DC | PRN
  Administered 2019-02-09 – 2019-02-10 (×2): 650 mg via ORAL
  Filled 2019-02-09 (×2): qty 2

## 2019-02-09 MED ORDER — ACETAMINOPHEN 650 MG RE SUPP: 650.0000 mg | Freq: Four times a day (QID) | RECTAL | Status: DC | PRN

## 2019-02-09 MED ORDER — SIMVASTATIN 20 MG PO TABS
20.0000 mg | ORAL_TABLET | Freq: Every day | ORAL | Status: DC
  Administered 2019-02-09: 20 mg via ORAL
  Filled 2019-02-09: qty 1

## 2019-02-09 MED ORDER — INSULIN ASPART 100 UNIT/ML ~~LOC~~ SOLN
0.0000 [IU] | Freq: Three times a day (TID) | SUBCUTANEOUS | Status: DC
  Administered 2019-02-09: 17:00:00 1 [IU] via SUBCUTANEOUS
  Administered 2019-02-09 – 2019-02-10 (×2): 2 [IU] via SUBCUTANEOUS
  Administered 2019-02-10: 09:00:00 1 [IU] via SUBCUTANEOUS

## 2019-02-09 MED ORDER — LISINOPRIL 5 MG PO TABS
2.5000 mg | ORAL_TABLET | Freq: Two times a day (BID) | ORAL | Status: DC
  Administered 2019-02-09 – 2019-02-10 (×3): 2.5 mg via ORAL
  Filled 2019-02-09 (×3): qty 1

## 2019-02-09 MED ORDER — ALUM & MAG HYDROXIDE-SIMETH 200-200-20 MG/5ML PO SUSP
30.0000 mL | Freq: Once | ORAL | Status: DC
  Filled 2019-02-09: qty 30

## 2019-02-09 MED ORDER — ASPIRIN EC 81 MG PO TBEC
81.0000 mg | DELAYED_RELEASE_TABLET | ORAL | Status: DC
  Administered 2019-02-10: 81 mg via ORAL
  Filled 2019-02-09 (×2): qty 1

## 2019-02-09 MED ORDER — CLOPIDOGREL BISULFATE 75 MG PO TABS
75.0000 mg | ORAL_TABLET | Freq: Every day | ORAL | Status: DC
  Administered 2019-02-09 – 2019-02-10 (×2): 75 mg via ORAL
  Filled 2019-02-09 (×2): qty 1

## 2019-02-09 MED ORDER — SODIUM CHLORIDE 0.9 % IV SOLN
1.0000 g | INTRAVENOUS | Status: DC
  Administered 2019-02-09: 14:00:00 1 g via INTRAVENOUS
  Filled 2019-02-09: qty 10

## 2019-02-09 MED ORDER — HYDRALAZINE HCL 20 MG/ML IJ SOLN: 10.0000 mg | Freq: Four times a day (QID) | INTRAMUSCULAR | Status: DC | PRN

## 2019-02-09 MED ORDER — METOPROLOL TARTRATE 25 MG PO TABS
12.5000 mg | ORAL_TABLET | Freq: Two times a day (BID) | ORAL | Status: DC
  Administered 2019-02-09 – 2019-02-10 (×3): 12.5 mg via ORAL
  Filled 2019-02-09 (×3): qty 1

## 2019-02-09 NOTE — Progress Notes (Signed)
Patient admitted this morning due to fall and L3 compression fracture.  Patient seen and examined.  He states that his pain is very well controlled as long as he is still but it hurts him when he moves.  He is comfortable.  Lungs clear to auscultation.  Abdomen soft and nontender.  I have consulted neurosurgery and they looked at his scans and I received a call from on-call neurosurgeon that they do not need to see patient and instead they recommend TLSO brace for the patient which they were going to order.  We will consult PT OT to see him after that.

## 2019-02-09 NOTE — Progress Notes (Signed)
Orthopedic Tech Progress Note Patient Details:  [name] [birth year] [id number] Called in order to [facility] for a LUMBAR SACRAL SUPPORT  Patient ID: [name], male   DOB: [birth year], 78 y.o.   MRN: [id number]   Janit Pagan 02/09/2019, 10:17 AM

## 2019-02-09 NOTE — Consult Note (Signed)
Reason for Consult:L 3 fracture Referring Physician: [name] is an 78 y.o. male.   HPI (per Hospitalist): [name] is a 78 y.o. male with history of diabetes mellitus type 2, hypertension, lymphoma in remission, A. fib not on anticoagulation with history of stroke with right-sided weakness was brought to the ER after patient had a fall at home.  Patient states he has been feeling weak for last 2 days and had gone to the bathroom and use the commode.  Following which he came back to the room when he suddenly fell but did not hit his head or lose consciousness.  Did not have any chest pain or shortness of breath.  He had called his daughter for help and patient was brought to the ER.  ED Course: In the ER patient was complaining of low back pain and had a CT of the T-spine and L-spine which showed L3 compression fracture.  Given the pain patient has been admitted for further management.  Patient was febrile in the ER with temperature 102 F with leukocytosis.  UA is compatible UTI patient does have some difficulty urination for many weeks now.  Cultures obtained started on empiric antibiotics for UTI.  I reviewed the CT scan and have recommended TLSO brace when patient is up and outpatient followup for this fracture in one month.  Past Medical History:  Diagnosis Date  . A-fib ([place])   . 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 B-cell lymphoma ([place])   . Cataract   . Charcot-Marie-Tooth disease   . Diabetes mellitus   . Hypertension   . 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 SIRS (systemic inflammatory response syndrome) ([place]) 09/2015  . Stroke ([place])   . Weakness     Past Surgical History:  Procedure Laterality Date  . ANKLE FUSION Left   . CATARACT EXTRACTION    . CIRCUMCISION    . TOE AMPUTATION     right foot third toe   . URETHRA SURGERY      Family History  Problem Relation Age of Onset  . Hypertension Mother   .  Hypertension Father     Social History:  reports that he quit smoking about 27 years ago. He has a 70.00 pack-year smoking history. He quit smokeless tobacco use about 28 years ago. He reports that he does not drink alcohol or use drugs.  Allergies:  Allergies  Allergen Reactions  . Prevnar [Pneumococcal 13-Val Conj Vacc] Anaphylaxis  . Actos [Pioglitazone Hydrochloride] Other (See Comments)    Afib  . Hydrochlorothiazide Other (See Comments)    Lower potassium to low  . Penicillins Rash    Tolerated cefepime, ceftriaxone  Did it involve swelling of the face/tongue/throat, SOB, or low BP? No Did it involve sudden or severe rash/hives, skin peeling, or any reaction on the inside of your mouth or nose?Yes Did you need to seek medical attention at a hospital or doctor's office. Yes When did it last happen?78 years old If all above answers are "NO", may proceed with cephalosporin use.    Medications: I have reviewed the patient's current medications.  Results for orders placed or performed during the hospital encounter of 02/08/19 (from the past 48 hour(s))  Basic metabolic panel     Status: Abnormal   Collection Time: 02/08/19 11:41 AM  Result Value Ref Range   Sodium 139 135 - 145 mmol/L   Potassium 3.7 3.5 -  5.1 mmol/L   Chloride 105 98 - 111 mmol/L   CO2 23 22 - 32 mmol/L   Glucose, Bld 83 70 - 99 mg/dL   BUN 28 (H) 8 - 23 mg/dL   Creatinine, Ser 1.16 0.61 - 1.24 mg/dL   Calcium 8.6 (L) 8.9 - 10.3 mg/dL   GFR calc non Af Amer >60 >60 mL/min   GFR calc Af Amer >60 >60 mL/min   Anion gap 11 5 - 15    Comment: Performed at [facility] [address], [place], [place] [address]  CBC with Differential     Status: Abnormal   Collection Time: 02/08/19 11:41 AM  Result Value Ref Range   WBC 24.8 (H) 4.0 - 10.5 K/uL   RBC 5.32 4.22 - 5.81 MIL/uL   Hemoglobin 16.2 13.0 - 17.0 g/dL   HCT 48.3 39.0 - 52.0 %   MCV 90.8 80.0 - 100.0 fL   MCH 30.5 26.0 - 34.0 pg    MCHC 33.5 30.0 - 36.0 g/dL   RDW 12.8 11.5 - 15.5 %   Platelets 127 (L) 150 - 400 K/uL   nRBC 0.0 0.0 - 0.2 %   Neutrophils Relative % 90 %   Neutro Abs 22.3 (H) 1.7 - 7.7 K/uL   Lymphocytes Relative 2 %   Lymphs Abs 0.5 (L) 0.7 - 4.0 K/uL   Monocytes Relative 7 %   Monocytes Absolute 1.6 (H) 0.1 - 1.0 K/uL   Eosinophils Relative 0 %   Eosinophils Absolute 0.0 0.0 - 0.5 K/uL   Basophils Relative 0 %   Basophils Absolute 0.1 0.0 - 0.1 K/uL   Immature Granulocytes 1 %   Abs Immature Granulocytes 0.33 (H) 0.00 - 0.07 K/uL    Comment: Performed at [facility] [address] [place], [place] [address]  Urinalysis, Routine w reflex microscopic     Status: Abnormal   Collection Time: 02/08/19  6:22 PM  Result Value Ref Range   Color, Urine AMBER (A) YELLOW    Comment: BIOCHEMICALS MAY BE AFFECTED BY COLOR   APPearance CLOUDY (A) CLEAR   Specific Gravity, Urine 1.016 1.005 - 1.030   pH 5.0 5.0 - 8.0   Glucose, UA NEGATIVE NEGATIVE mg/dL   Hgb urine dipstick MODERATE (A) NEGATIVE   Bilirubin Urine NEGATIVE NEGATIVE   Ketones, ur 20 (A) NEGATIVE mg/dL   Protein, ur 30 (A) NEGATIVE mg/dL   Nitrite NEGATIVE NEGATIVE   Leukocytes,Ua MODERATE (A) NEGATIVE   RBC / HPF 21-50 0 - 5 RBC/hpf   WBC, UA >50 (H) 0 - 5 WBC/hpf   Bacteria, UA MANY (A) NONE SEEN   Squamous Epithelial / LPF 0-5 0 - 5   WBC Clumps PRESENT    Mucus PRESENT    Hyaline Casts, UA PRESENT     Comment: Performed at [facility], [place] [address], [place], [place] [address]  SARS CORONAVIRUS 2 Nasal Swab Aptima Multi Swab     Status: None   Collection Time: 02/08/19  7:23 PM   Specimen: Aptima Multi Swab; Nasal Swab  Result Value Ref Range   SARS Coronavirus 2 NEGATIVE NEGATIVE    Comment: (NOTE) SARS-CoV-2 target nucleic acids are NOT DETECTED. The SARS-CoV-2 RNA is generally detectable in upper and lower respiratory specimens during the acute phase of infection. Negative results do not preclude  SARS-CoV-2 infection, do not rule out co-infections with other pathogens, and should not be used as the sole basis for  treatment or other patient management decisions. Negative results must be combined with clinical observations, patient history, and epidemiological information. The expected result is Negative. Fact Sheet for Patients: SugarRoll.be Fact Sheet for Healthcare Providers: https://www.woods-mathews.com/ This test is not yet approved or cleared by the [place] FDA and  has been authorized for detection and/or diagnosis of SARS-CoV-2 by FDA under an Emergency Use Authorization (EUA). This EUA will remain  in effect (meaning this test can be used) for the duration of the COVID-19 declaration under Section 56 4(b)(1) of the Act, 21 U.S.C. section 360bbb-3(b)(1), unless the authorization is terminated or revoked sooner. Performed at [facility], [place] [address] [place], [place] [address]   MRSA PCR Screening     Status: Abnormal   Collection Time: 02/09/19 12:27 AM   Specimen: Nasal Mucosa; Nasopharyngeal  Result Value Ref Range   MRSA by PCR (A) NEGATIVE    INVALID, UNABLE TO DETERMINE THE PRESENCE OF TARGET DUE TO SPECIMEN INTEGRITY. RECOLLECTION REQUESTED.    Comment: NOTIFIED RHINEHART,D RN 02/09/2019 AT [address]063 [address],P Performed at [facility], [place] [address], [place], [place] [address]   Hemoglobin A1c     Status: Abnormal   Collection Time: 02/09/19 12:37 AM  Result Value Ref Range   Hgb A1c MFr Bld 7.6 (H) 4.8 - 5.6 %    Comment: (NOTE) Pre diabetes:          5.7%-6.4% Diabetes:              >6.4% Glycemic control for   <7.0% adults with diabetes    Mean Plasma Glucose 171.42 mg/dL    Comment: Performed at [facility] [address], [place], [place] [address]  Comprehensive metabolic panel     Status: Abnormal   Collection Time: 02/09/19 12:37 AM  Result Value Ref Range   Sodium 139 135 - 145  mmol/L   Potassium 5.1 3.5 - 5.1 mmol/L    Comment: DELTA CHECK NOTED NO VISIBLE HEMOLYSIS    Chloride 105 98 - 111 mmol/L   CO2 22 22 - 32 mmol/L   Glucose, Bld 92 70 - 99 mg/dL   BUN 37 (H) 8 - 23 mg/dL   Creatinine, Ser 1.23 0.61 - 1.24 mg/dL   Calcium 8.4 (L) 8.9 - 10.3 mg/dL   Total Protein 5.8 (L) 6.5 - 8.1 g/dL   Albumin 2.9 (L) 3.5 - 5.0 g/dL   AST 48 (H) 15 - 41 U/L   ALT 29 0 - 44 U/L   Alkaline Phosphatase 65 38 - 126 U/L   Total Bilirubin 0.7 0.3 - 1.2 mg/dL   GFR calc non Af Amer 56 (L) >60 mL/min   GFR calc Af Amer >60 >60 mL/min   Anion gap 12 5 - 15    Comment: Performed at [facility], [place] [address], [place],  [address]  CBC WITH DIFFERENTIAL     Status: Abnormal   Collection Time: 02/09/19 12:37 AM  Result Value Ref Range   WBC 23.6 (H) 4.0 - 10.5 K/uL   RBC 5.12 4.22 - 5.81 MIL/uL   Hemoglobin 15.7 13.0 - 17.0 g/dL   HCT 46.2 39.0 - 52.0 %   MCV 90.2 80.0 - 100.0 fL   MCH 30.7 26.0 - 34.0 pg   MCHC 34.0 30.0 - 36.0 g/dL   RDW 13.0 11.5 - 15.5 %   Platelets 124 (L) 150 - 400 K/uL    Comment: REPEATED TO VERIFY  nRBC 0.0 0.0 - 0.2 %   Neutrophils Relative % 91 %   Neutro Abs 21.3 (H) 1.7 - 7.7 K/uL   Lymphocytes Relative 2 %   Lymphs Abs 0.6 (L) 0.7 - 4.0 K/uL   Monocytes Relative 6 %   Monocytes Absolute 1.4 (H) 0.1 - 1.0 K/uL   Eosinophils Relative 0 %   Eosinophils Absolute 0.1 0.0 - 0.5 K/uL   Basophils Relative 0 %   Basophils Absolute 0.1 0.0 - 0.1 K/uL   Immature Granulocytes 1 %   Abs Immature Granulocytes 0.20 (H) 0.00 - 0.07 K/uL    Comment: Performed at [facility] [address], [place], [place] [address]  Culture, blood (routine x 2)     Status: None (Preliminary result)   Collection Time: 02/09/19  2:48 AM   Specimen: BLOOD LEFT FOREARM  Result Value Ref Range   Specimen Description BLOOD LEFT FOREARM    Special Requests      BOTTLES DRAWN AEROBIC AND ANAEROBIC Blood Culture adequate volume Performed at  [facility], [place] [address], [place], [place] [address]    Culture PENDING    Report Status PENDING   Glucose, capillary     Status: None   Collection Time: 02/09/19  7:02 AM  Result Value Ref Range   Glucose-Capillary 73 70 - 99 mg/dL    Ct Thoracic Spine Wo Contrast  Result Date: 02/08/2019 CLINICAL DATA:  78 year old male with fall and mid to lower back pain. EXAM: CT THORACIC AND LUMBAR SPINE WITHOUT CONTRAST TECHNIQUE: Multidetector CT imaging of the thoracic and lumbar spine was performed without contrast. Multiplanar CT image reconstructions were also generated. COMPARISON:  Chest radiograph dated 07/05/2017 and CT of the abdomen pelvis dated 10/07/2012. FINDINGS: CT THORACIC SPINE FINDINGS Alignment: Normal. Vertebrae: No acute fracture. Osteopenia. The posterior elements are intact. Paraspinal and other soft tissues: No paraspinal fluid collection or hematoma. There is atherosclerotic calcification of the visualized aorta. Coronary vascular calcifications noted. Disc levels: Multilevel degenerative changes. There is disc desiccation and vacuum phenomena primarily at T7-T10. CT LUMBAR SPINE FINDINGS Segmentation: 6 non-rib-bearing vertebra. Alignment: No acute subluxation. Vertebrae: There is nondisplaced fracture of the inferior endplate of the L3 (third non-rib-bearing vertebra). The bones are osteopenic. No other acute fracture identified. No retropulsed fragment. Paraspinal and other soft tissues: Atherosclerotic calcification of the aorta. There are multiple sigmoid diverticulosis. No paraspinal soft tissue swelling or fluid collection or hematoma. Disc levels: Multilevel degenerative disc disease. There is lower lumbar disc desiccation and vacuum phenomena. Multilevel facet arthropathy. IMPRESSION: 1. No acute/traumatic thoracic spine pathology. 2. Mild nondisplaced compression fracture of the inferior endplate of the third nonrib bearing lumbar spine vertebra. 3.  Aortic  Atherosclerosis (ICD10-I70.0). Electronically Signed   By: Anner Crete M.D.   On: 02/08/2019 14:42   Ct Lumbar Spine Wo Contrast  Result Date: 02/08/2019 CLINICAL DATA:  78 year old male with fall and mid to lower back pain. EXAM: CT THORACIC AND LUMBAR SPINE WITHOUT CONTRAST TECHNIQUE: Multidetector CT imaging of the thoracic and lumbar spine was performed without contrast. Multiplanar CT image reconstructions were also generated. COMPARISON:  Chest radiograph dated 07/05/2017 and CT of the abdomen pelvis dated 10/07/2012. FINDINGS: CT THORACIC SPINE FINDINGS Alignment: Normal. Vertebrae: No acute fracture. Osteopenia. The posterior elements are intact. Paraspinal and other soft tissues: No paraspinal fluid collection or hematoma. There is atherosclerotic calcification of the visualized aorta. Coronary vascular calcifications noted. Disc levels: Multilevel degenerative changes. There is disc desiccation and  vacuum phenomena primarily at T7-T10. CT LUMBAR SPINE FINDINGS Segmentation: 6 non-rib-bearing vertebra. Alignment: No acute subluxation. Vertebrae: There is nondisplaced fracture of the inferior endplate of the L3 (third non-rib-bearing vertebra). The bones are osteopenic. No other acute fracture identified. No retropulsed fragment. Paraspinal and other soft tissues: Atherosclerotic calcification of the aorta. There are multiple sigmoid diverticulosis. No paraspinal soft tissue swelling or fluid collection or hematoma. Disc levels: Multilevel degenerative disc disease. There is lower lumbar disc desiccation and vacuum phenomena. Multilevel facet arthropathy. IMPRESSION: 1. No acute/traumatic thoracic spine pathology. 2. Mild nondisplaced compression fracture of the inferior endplate of the third nonrib bearing lumbar spine vertebra. 3.  Aortic Atherosclerosis (ICD10-I70.0). Electronically Signed   By: Anner Crete M.D.   On: 02/08/2019 14:42    Review of Systems - Negative except Per HPI     Blood pressure (!) 123/50, pulse 97, temperature 97.8 F (36.6 C), temperature source Oral, resp. rate 16, height 5\' 9"  (1.753 m), weight 77.1 kg, SpO2 92 %. Physical Exam  Assessment/Plan: I reviewed the CT scan and have recommended TLSO brace when patient is up for stability and pain management for this fracture and outpatient followup with X rays in one month.  Peggyann Shoals, MD 02/09/2019, 10:09 AM

## 2019-02-10 DIAGNOSIS — A419 Sepsis, unspecified organism: Principal | ICD-10-CM

## 2019-02-10 DIAGNOSIS — S32030A Wedge compression fracture of third lumbar vertebra, initial encounter for closed fracture: Secondary | ICD-10-CM

## 2019-02-10 LAB — URINE CULTURE: Culture: >=100000 — AB

## 2019-02-10 LAB — CBC WITH DIFFERENTIAL/PLATELET
Abs Immature Granulocytes: 0.07 10*3/uL (ref 0.00–0.07)
Basophils Absolute: 0.1 10*3/uL (ref 0.0–0.1)
Basophils Relative: 0 %
Eosinophils Absolute: 0.3 10*3/uL (ref 0.0–0.5)
Eosinophils Relative: 2 %
HCT: 45.7 % (ref 39.0–52.0)
Hemoglobin: 15.3 g/dL (ref 13.0–17.0)
Immature Granulocytes: 1 %
Lymphocytes Relative: 8 %
Lymphs Abs: 1.2 10*3/uL (ref 0.7–4.0)
MCH: 30.3 pg (ref 26.0–34.0)
MCHC: 33.5 g/dL (ref 30.0–36.0)
MCV: 90.5 fL (ref 80.0–100.0)
Monocytes Absolute: 2 10*3/uL — ABNORMAL HIGH (ref 0.1–1.0)
Monocytes Relative: 13 %
Neutro Abs: 11.6 10*3/uL — ABNORMAL HIGH (ref 1.7–7.7)
Neutrophils Relative %: 76 %
Platelets: 123 10*3/uL — ABNORMAL LOW (ref 150–400)
RBC: 5.05 MIL/uL (ref 4.22–5.81)
RDW: 13 % (ref 11.5–15.5)
WBC: 15.2 10*3/uL — ABNORMAL HIGH (ref 4.0–10.5)
nRBC: 0 % (ref 0.0–0.2)

## 2019-02-10 LAB — BASIC METABOLIC PANEL
Anion gap: 10 (ref 5–15)
BUN: 40 mg/dL — ABNORMAL HIGH (ref 8–23)
CO2: 21 mmol/L — ABNORMAL LOW (ref 22–32)
Calcium: 8.2 mg/dL — ABNORMAL LOW (ref 8.9–10.3)
Chloride: 105 mmol/L (ref 98–111)
Creatinine, Ser: 1.01 mg/dL (ref 0.61–1.24)
GFR calc Af Amer: >60 mL/min (ref >60)
GFR calc non Af Amer: >60 mL/min (ref >60)
Glucose, Bld: 129 mg/dL — ABNORMAL HIGH (ref 70–99)
Potassium: 4.3 mmol/L (ref 3.5–5.1)
Sodium: 136 mmol/L (ref 135–145)

## 2019-02-10 LAB — MAGNESIUM: Magnesium: 2.2 mg/dL (ref 1.7–2.4)

## 2019-02-10 LAB — GLUCOSE, CAPILLARY
Glucose-Capillary: 116 mg/dL — ABNORMAL HIGH (ref 70–99)
Glucose-Capillary: 178 mg/dL — ABNORMAL HIGH (ref 70–99)

## 2019-02-10 MED ORDER — SULFAMETHOXAZOLE-TRIMETHOPRIM 800-160 MG PO TABS: 1.0000 | ORAL_TABLET | Freq: Two times a day (BID) | ORAL | 0 refills | Status: AC

## 2019-02-10 MED ORDER — ALUM & MAG HYDROXIDE-SIMETH 200-200-20 MG/5ML PO SUSP
30.0000 mL | Freq: Four times a day (QID) | ORAL | Status: DC | PRN
  Administered 2019-02-10: 09:00:00 30 mL via ORAL
  Filled 2019-02-10: qty 30

## 2019-02-10 NOTE — Discharge Summary (Signed)
Physician Discharge Summary  [name] [id number] DOB: 1941/03/31 DOA: 02/08/2019  PCP: Alroy Dust, L.Marlou Sa, MD  Admit date: 02/08/2019 Discharge date: 02/10/2019  Admitted From: Home Disposition: Home  Recommendations for Outpatient Follow-up:  1. Follow up with PCP in 1-2 weeks 2. Please obtain BMP/CBC in one week 3. Please follow up on the following pending results:  Home Health: Yes Equipment/Devices: TLSO brace  Discharge Condition: Stable CODE STATUS: Full code Diet recommendation: Cardiac  Subjective: Patient seen and examined.  He states that his pain is very well controlled when he is wearing TLSO brace.  He also told me that he is going to live with his daughter for at least a month and is ready to go.  Brief/Interim Summary: [name] is a 78 y.o. male with history of diabetes mellitus type 2, hypertension, lymphoma in remission, A. fib not on anticoagulation with history of stroke with right-sided weakness was brought to the ER after patient had a fall at home.  Patient states he has been feeling weak for last 2 days and had gone to the bathroom and use the commode.  Following which he came back to the room when he suddenly fell but did not hit his head or lose consciousness.  Did not have any chest pain or shortness of breath.  He had called his daughter for help and patient was brought to the ER.  In the ER patient was complaining of low back pain and had a CT of the T-spine and L-spine which showed L3 compression fracture.  Given the pain patient has been admitted for further management.  Patient was febrile in the ER with temperature 102 F with leukocytosis.  Patient did not endorse any urinary complaints however his UA was compatible with UTI.  He was diagnosed with sepsis secondary to UTI and was started on Rocephin empirically.  He was then admitted under hospitalist service.  Neurosurgery was consulted however they reviewed his films remotely and they  recommended TLSO brace and in fact they ordered this for patient which patient received and following that, patient was evaluated by PT and they recommended discharging home with home health.  Patient needs to follow-up with PCP within a week and neurosurgery within few weeks.  His urine culture has remained negative however due to strong evidence of sepsis and UTI, I am going to treat him for 5 more days with Bactrim DS.  Patient told me that he is going to live with his daughter and has enough support.  I also spoke to patient's daughter Thea Silversmith over the phone and she is in agreement with the plan.  Discharge Diagnoses:  Active Problems:   History of CVA (cerebrovascular accident)   Sepsis secondary to UTI ([place])   PAF (paroxysmal atrial fibrillation) (H[facility])   Type 2 diabetes mellitus with peripheral neuropathy ([facility])   Closed compression fracture of L3 lumbar vertebra, initial encounter [facility])    Discharge Instructions  Discharge Instructions    Discharge patient   Complete by: As directed    Discharge disposition: 06-Home-Health Care Svc   Discharge patient date: 02/10/2019     Allergies as of 02/10/2019      Reactions   Prevnar [pneumococcal 13-val Conj Vacc] Anaphylaxis   Actos [pioglitazone Hydrochloride] Other (See Comments)   Afib   Hydrochlorothiazide Other (See Comments)   Lower potassium to low   Penicillins Rash   Tolerated cefepime, ceftriaxone Did it involve swelling of the face/tongue/throat, SOB, or low BP? No Did  it involve sudden or severe rash/hives, skin peeling, or any reaction on the inside of your mouth or nose?Yes Did you need to seek medical attention at a hospital or doctor's office. Yes When did it last happen?78 years old If all above answers are "NO", may proceed with cephalosporin use.      Medication List    TAKE these medications   amLODipine 10 MG tablet Commonly known as: NORVASC Take 1 tablet (10 mg total) by mouth daily. What  changed:   how much to take  when to take this   aspirin 81 MG EC tablet Take 1 tablet (81 mg total) by mouth daily. What changed:   when to take this  additional instructions   lisinopril 2.5 MG tablet Commonly known as: ZESTRIL Take 3 tablets (7.5 mg total) by mouth daily. What changed:   how much to take  when to take this   metoprolol tartrate 25 MG tablet Commonly known as: LOPRESSOR Take 1 tablet (25 mg total) by mouth 2 (two) times daily. What changed: how much to take   simvastatin 20 MG tablet Commonly known as: ZOCOR Take 1 tablet (20 mg total) by mouth at bedtime.   sulfamethoxazole-trimethoprim 800-160 MG tablet Commonly known as: BACTRIM DS Take 1 tablet by mouth 2 (two) times daily for 5 days.   Tresiba 100 UNIT/ML Soln Generic drug: Insulin Degludec Inject 18 Units into the skin daily.      Follow-up Information    Alroy Dust, L.Marlou Sa, MD Follow up in 1 week(s).   Specialty: Family Medicine Contact information: [address] [place] [address] [phone]          Allergies  Allergen Reactions  . Prevnar [Pneumococcal 13-Val Conj Vacc] Anaphylaxis  . Actos [Pioglitazone Hydrochloride] Other (See Comments)    Afib  . Hydrochlorothiazide Other (See Comments)    Lower potassium to low  . Penicillins Rash    Tolerated cefepime, ceftriaxone  Did it involve swelling of the face/tongue/throat, SOB, or low BP? No Did it involve sudden or severe rash/hives, skin peeling, or any reaction on the inside of your mouth or nose?Yes Did you need to seek medical attention at a hospital or doctor's office. Yes When did it last happen?78 years old If all above answers are "NO", may proceed with cephalosporin use.    Consultations: Neurosurgery   Procedures/Studies: Ct Thoracic Spine Wo Contrast  Result Date: 02/08/2019 CLINICAL DATA:  78 year old male with fall and mid to lower back pain. EXAM: CT THORACIC AND LUMBAR  SPINE WITHOUT CONTRAST TECHNIQUE: Multidetector CT imaging of the thoracic and lumbar spine was performed without contrast. Multiplanar CT image reconstructions were also generated. COMPARISON:  Chest radiograph dated 07/05/2017 and CT of the abdomen pelvis dated 10/07/2012. FINDINGS: CT THORACIC SPINE FINDINGS Alignment: Normal. Vertebrae: No acute fracture. Osteopenia. The posterior elements are intact. Paraspinal and other soft tissues: No paraspinal fluid collection or hematoma. There is atherosclerotic calcification of the visualized aorta. Coronary vascular calcifications noted. Disc levels: Multilevel degenerative changes. There is disc desiccation and vacuum phenomena primarily at T7-T10. CT LUMBAR SPINE FINDINGS Segmentation: 6 non-rib-bearing vertebra. Alignment: No acute subluxation. Vertebrae: There is nondisplaced fracture of the inferior endplate of the L3 (third non-rib-bearing vertebra). The bones are osteopenic. No other acute fracture identified. No retropulsed fragment. Paraspinal and other soft tissues: Atherosclerotic calcification of the aorta. There are multiple sigmoid diverticulosis. No paraspinal soft tissue swelling or fluid collection or hematoma. Disc levels: Multilevel degenerative disc disease.  There is lower lumbar disc desiccation and vacuum phenomena. Multilevel facet arthropathy. IMPRESSION: 1. No acute/traumatic thoracic spine pathology. 2. Mild nondisplaced compression fracture of the inferior endplate of the third nonrib bearing lumbar spine vertebra. 3.  Aortic Atherosclerosis (ICD10-I70.0). Electronically Signed   By: Anner Crete M.D.   On: 02/08/2019 14:42   Ct Lumbar Spine Wo Contrast  Result Date: 02/08/2019 CLINICAL DATA:  78 year old male with fall and mid to lower back pain. EXAM: CT THORACIC AND LUMBAR SPINE WITHOUT CONTRAST TECHNIQUE: Multidetector CT imaging of the thoracic and lumbar spine was performed without contrast. Multiplanar CT image reconstructions  were also generated. COMPARISON:  Chest radiograph dated 07/05/2017 and CT of the abdomen pelvis dated 10/07/2012. FINDINGS: CT THORACIC SPINE FINDINGS Alignment: Normal. Vertebrae: No acute fracture. Osteopenia. The posterior elements are intact. Paraspinal and other soft tissues: No paraspinal fluid collection or hematoma. There is atherosclerotic calcification of the visualized aorta. Coronary vascular calcifications noted. Disc levels: Multilevel degenerative changes. There is disc desiccation and vacuum phenomena primarily at T7-T10. CT LUMBAR SPINE FINDINGS Segmentation: 6 non-rib-bearing vertebra. Alignment: No acute subluxation. Vertebrae: There is nondisplaced fracture of the inferior endplate of the L3 (third non-rib-bearing vertebra). The bones are osteopenic. No other acute fracture identified. No retropulsed fragment. Paraspinal and other soft tissues: Atherosclerotic calcification of the aorta. There are multiple sigmoid diverticulosis. No paraspinal soft tissue swelling or fluid collection or hematoma. Disc levels: Multilevel degenerative disc disease. There is lower lumbar disc desiccation and vacuum phenomena. Multilevel facet arthropathy. IMPRESSION: 1. No acute/traumatic thoracic spine pathology. 2. Mild nondisplaced compression fracture of the inferior endplate of the third nonrib bearing lumbar spine vertebra. 3.  Aortic Atherosclerosis (ICD10-I70.0). Electronically Signed   By: Anner Crete M.D.   On: 02/08/2019 14:42     Discharge Exam: Vitals:   02/10/19 0435 02/10/19 0747  BP: (!) 146/69 (!) 138/54  Pulse: 76 83  Resp: 20 17  Temp: 98.3 F (36.8 C)   SpO2: 95% 97%   Vitals:   02/09/19 2003 02/09/19 2310 02/10/19 0435 02/10/19 0747  BP: (!) 143/65 (!) 155/68 (!) 146/69 (!) 138/54  Pulse: 90 91 76 83  Resp: (!) 22  20 17   Temp: 99.4 F (37.4 C)  98.3 F (36.8 C)   TempSrc: Oral  Oral   SpO2: 92%  95% 97%  Weight:      Height:        General: Pt is alert, awake,  not in acute distress Cardiovascular: RRR, S1/S2 +, no rubs, no gallops Respiratory: CTA bilaterally, no wheezing, no rhonchi Abdominal: Soft, NT, ND, bowel sounds + Extremities: no edema, no cyanosis    The results of significant diagnostics from this hospitalization (including imaging, microbiology, ancillary and laboratory) are listed below for reference.     Microbiology: Recent Results (from the past 240 hour(s))  Urine culture     Status: None (Preliminary result)   Collection Time: 02/08/19  6:19 PM   Specimen: Urine, Clean Catch  Result Value Ref Range Status   Specimen Description URINE, CLEAN CATCH  Final   Special Requests NONE  Final   Culture   Final    CULTURE REINCUBATED FOR BETTER GROWTH Performed at [facility], [place] [address], [place], [place] [address]    Report Status PENDING  Incomplete  SARS CORONAVIRUS 2 Nasal Swab Aptima Multi Swab     Status: None   Collection Time: 02/08/19  7:23 PM   Specimen: Aptima Multi Swab; Nasal Swab  Result Value Ref Range Status   SARS Coronavirus 2 NEGATIVE NEGATIVE Final    Comment: (NOTE) SARS-CoV-2 target nucleic acids are NOT DETECTED. The SARS-CoV-2 RNA is generally detectable in upper and lower respiratory specimens during the acute phase of infection. Negative results do not preclude SARS-CoV-2 infection, do not rule out co-infections with other pathogens, and should not be used as the sole basis for treatment or other patient management decisions. Negative results must be combined with clinical observations, patient history, and epidemiological information. The expected result is Negative. Fact Sheet for Patients: SugarRoll.be Fact Sheet for Healthcare Providers: https://www.woods-mathews.com/ This test is not yet approved or cleared by the [place] FDA and  has been authorized for detection and/or diagnosis of SARS-CoV-2 by FDA under an Emergency Use  Authorization (EUA). This EUA will remain  in effect (meaning this test can be used) for the duration of the COVID-19 declaration under Section 56 4(b)(1) of the Act, 21 U.S.C. section 360bbb-3(b)(1), unless the authorization is terminated or revoked sooner. Performed at [facility], [place] [address] [place], [place] [address]   MRSA PCR Screening     Status: Abnormal   Collection Time: 02/09/19 12:27 AM   Specimen: Nasal Mucosa; Nasopharyngeal  Result Value Ref Range Status   MRSA by PCR (A) NEGATIVE Final    INVALID, UNABLE TO DETERMINE THE PRESENCE OF TARGET DUE TO SPECIMEN INTEGRITY. RECOLLECTION REQUESTED.    Comment: NOTIFIED RHINEHART,D RN 02/09/2019 AT [id number] SKEEN,P Performed at [facility], [place] [address] [place], [place] [address]   Culture, blood (routine x 2)     Status: None (Preliminary result)   Collection Time: 02/09/19  2:37 AM   Specimen: BLOOD  Result Value Ref Range Status   Specimen Description BLOOD RIGHT ANTECUBITAL  Final   Special Requests   Final    BOTTLES DRAWN AEROBIC ONLY Blood Culture results may not be optimal due to an inadequate volume of blood received in culture bottles   Culture   Final    NO GROWTH 1 DAY Performed at [facility], [place] [address], [place], [place] [address]    Report Status PENDING  Incomplete  Culture, blood (routine x 2)     Status: None (Preliminary result)   Collection Time: 02/09/19  2:48 AM   Specimen: BLOOD LEFT FOREARM  Result Value Ref Range Status   Specimen Description BLOOD LEFT FOREARM  Final   Special Requests   Final    BOTTLES DRAWN AEROBIC AND ANAEROBIC Blood Culture adequate volume   Culture   Final    NO GROWTH 1 DAY Performed at [facility], [place] [address] [place], [place] [address]    Report Status PENDING  Incomplete     Labs: BNP (last 3 results) No results for input(s): BNP in the last 8760 hours. Basic Metabolic Panel: Recent Labs  Lab 02/08/19 1141  02/09/19 0037 02/10/19 0749  NA 139 139 136  K 3.7 5.1 4.3  CL 105 105 105  CO2 23 22 21*  GLUCOSE 83 92 129*  BUN 28* 37* 40*  CREATININE 1.16 1.23 1.01  CALCIUM 8.6* 8.4* 8.2*  MG  --   --  2.2   Liver Function Tests: Recent Labs  Lab 02/09/19 0037  AST 48*  ALT 29  ALKPHOS 65  BILITOT 0.7  PROT 5.8*  ALBUMIN 2.9*   No results for input(s): LIPASE, AMYLASE in the last 168 hours. No results for input(s): AMMONIA  in the last 168 hours. CBC: Recent Labs  Lab 02/08/19 1141 02/09/19 0037 02/10/19 0749  WBC 24.8* 23.6* 15.2*  NEUTROABS 22.3* 21.3* 11.6*  HGB 16.2 15.7 15.3  HCT 48.3 46.2 45.7  MCV 90.8 90.2 90.5  PLT 127* 124* 123*   Cardiac Enzymes: No results for input(s): CKTOTAL, CKMB, CKMBINDEX, TROPONINI in the last 168 hours. BNP: Invalid input(s): POCBNP CBG: Recent Labs  Lab 02/09/19 1304 02/09/19 1630 02/09/19 2228 02/10/19 0638 02/10/19 1250  GLUCAP 161* 134* 152* 116* 178*   D-Dimer No results for input(s): DDIMER in the last 72 hours. Hgb A1c Recent Labs    02/09/19 0037  HGBA1C 7.6*   Lipid Profile No results for input(s): CHOL, HDL, LDLCALC, TRIG, CHOLHDL, LDLDIRECT in the last 72 hours. Thyroid function studies No results for input(s): TSH, T4TOTAL, T3FREE, THYROIDAB in the last 72 hours.  Invalid input(s): FREET3 Anemia work up No results for input(s): VITAMINB12, FOLATE, FERRITIN, TIBC, IRON, RETICCTPCT in the last 72 hours. Urinalysis    Component Value Date/Time   COLORURINE AMBER (A) 02/08/2019 1822   APPEARANCEUR CLOUDY (A) 02/08/2019 1822   LABSPEC 1.016 02/08/2019 1822   PHURINE 5.0 02/08/2019 1822   GLUCOSEU NEGATIVE 02/08/2019 1822   HGBUR MODERATE (A) 02/08/2019 1822   BILIRUBINUR NEGATIVE 02/08/2019 1822   KETONESUR 20 (A) 02/08/2019 1822   PROTEINUR 30 (A) 02/08/2019 1822   UROBILINOGEN 0.2 05/29/2014 2125   NITRITE NEGATIVE 02/08/2019 1822   LEUKOCYTESUR MODERATE (A) 02/08/2019 1822   Sepsis Labs Invalid  input(s): PROCALCITONIN,  WBC,  LACTICIDVEN Microbiology Recent Results (from the past 240 hour(s))  Urine culture     Status: None (Preliminary result)   Collection Time: 02/08/19  6:19 PM   Specimen: Urine, Clean Catch  Result Value Ref Range Status   Specimen Description URINE, CLEAN CATCH  Final   Special Requests NONE  Final   Culture   Final    CULTURE REINCUBATED FOR BETTER GROWTH Performed at [facility], [place] [address], [place], [place] [address]    Report Status PENDING  Incomplete  SARS CORONAVIRUS 2 Nasal Swab Aptima Multi Swab     Status: None   Collection Time: 02/08/19  7:23 PM   Specimen: Aptima Multi Swab; Nasal Swab  Result Value Ref Range Status   SARS Coronavirus 2 NEGATIVE NEGATIVE Final    Comment: (NOTE) SARS-CoV-2 target nucleic acids are NOT DETECTED. The SARS-CoV-2 RNA is generally detectable in upper and lower respiratory specimens during the acute phase of infection. Negative results do not preclude SARS-CoV-2 infection, do not rule out co-infections with other pathogens, and should not be used as the sole basis for treatment or other patient management decisions. Negative results must be combined with clinical observations, patient history, and epidemiological information. The expected result is Negative. Fact Sheet for Patients: SugarRoll.be Fact Sheet for Healthcare Providers: https://www.woods-mathews.com/ This test is not yet approved or cleared by the [place] FDA and  has been authorized for detection and/or diagnosis of SARS-CoV-2 by FDA under an Emergency Use Authorization (EUA). This EUA will remain  in effect (meaning this test can be used) for the duration of the COVID-19 declaration under Section 56 4(b)(1) of the Act, 21 U.S.C. section 360bbb-3(b)(1), unless the authorization is terminated or revoked sooner. Performed at [facility], [place] [address], [place],  [place] [address]   MRSA PCR Screening     Status: Abnormal   Collection Time: 02/09/19 12:27 AM   Specimen: Nasal  Mucosa; Nasopharyngeal  Result Value Ref Range Status   MRSA by PCR (A) NEGATIVE Final    INVALID, UNABLE TO DETERMINE THE PRESENCE OF TARGET DUE TO SPECIMEN INTEGRITY. RECOLLECTION REQUESTED.    Comment: NOTIFIED RHINEHART,D RN 02/09/2019 AT [id number] SKEEN,P Performed at [facility], [place] [address], [place], [place] [address]   Culture, blood (routine x 2)     Status: None (Preliminary result)   Collection Time: 02/09/19  2:37 AM   Specimen: BLOOD  Result Value Ref Range Status   Specimen Description BLOOD RIGHT ANTECUBITAL  Final   Special Requests   Final    BOTTLES DRAWN AEROBIC ONLY Blood Culture results may not be optimal due to an inadequate volume of blood received in culture bottles   Culture   Final    NO GROWTH 1 DAY Performed at [facility], [place] [address] [place], [place] [address]    Report Status PENDING  Incomplete  Culture, blood (routine x 2)     Status: None (Preliminary result)   Collection Time: 02/09/19  2:48 AM   Specimen: BLOOD LEFT FOREARM  Result Value Ref Range Status   Specimen Description BLOOD LEFT FOREARM  Final   Special Requests   Final    BOTTLES DRAWN AEROBIC AND ANAEROBIC Blood Culture adequate volume   Culture   Final    NO GROWTH 1 DAY Performed at [facility], [place] [address], [place], [place] [address]    Report Status PENDING  Incomplete     Time coordinating discharge: Over 30 minutes  SIGNED:   Darliss Cheney, MD  [facility] 02/10/2019, 1:20 PM Pager [phone]  If 7PM-7AM, please contact night-coverage [email or web address] Password [email or web address]

## 2019-02-10 NOTE — Evaluation (Signed)
Physical Therapy Evaluation Patient Details Name: [name] MRN: [id number] DOB: 1941-04-26 Today's Date: 02/10/2019   History of Present Illness  Pt is a 78 y.o. M with significant PMH of diabetes mellitus type 2, hypertension, lymphoma in remission, A. fib not on antiocoagulation, history of stroke with right sided weakness who presents after a fall, CT showing L3 compression fracture.  Clinical Impression  Pt admitted with above. PT evaluation limited by pt diarrhea with movement, pt states he typically wears briefs at home. Pt requiring min assist for transfers, ambulating limited room distances with walker and min guard assist. Reports good pain control. Education provided re: spinal precautions, brace use. Pt typically lives alone, but plans to discharge home with his daughter who is able to provide 24/7 assist. Recommending HHPT to maximize functional independence.     Follow Up Recommendations Home health PT;Supervision/Assistance - 24 hour    Equipment Recommendations  None recommended by PT    Recommendations for Other Services OT consult     Precautions / Restrictions Precautions Precautions: Back;Fall Precaution Booklet Issued: No Precaution Comments: verbally reviewed precautions Required Braces or Orthoses: Spinal Brace Spinal Brace: Thoracolumbosacral orthotic Restrictions Weight Bearing Restrictions: No      Mobility  Bed Mobility Overal bed mobility: Needs Assistance Bed Mobility: Rolling;Sidelying to Sit Rolling: Modified independent (Device/Increase time) Sidelying to sit: Supervision       General bed mobility comments: HOB elevated, heavy use of bed rail  Transfers Overall transfer level: Needs assistance Equipment used: Rolling walker (2 wheeled) Transfers: Sit to/from Stand Sit to Stand: Min assist         General transfer comment: MinA to boost up, slow to rise. x3 sit to stands from edge of bed for peri  care  Ambulation/Gait Ambulation/Gait assistance: Min guard Gait Distance (Feet): 10 Feet Assistive device: Rolling walker (2 wheeled) Gait Pattern/deviations: Step-through pattern;Decreased stride length;Trunk flexed Gait velocity: decreased Gait velocity interpretation: <1.8 ft/sec, indicate of risk for recurrent falls General Gait Details: Slow pace, increased trunk/hip flexion  Stairs            Wheelchair Mobility    Modified Rankin (Stroke Patients Only)       Balance Overall balance assessment: Needs assistance Sitting-balance support: Feet supported Sitting balance-Leahy Scale: Fair Sitting balance - Comments: Able to don socks with supervision   Standing balance support: During functional activity;No upper extremity supported Standing balance-Leahy Scale: Fair                               Pertinent Vitals/Pain Pain Assessment: Faces Faces Pain Scale: Hurts a little bit Pain Location: back Pain Descriptors / Indicators: Guarding Pain Intervention(s): Monitored during session    Home Living Family/patient expects to be discharged to:: Private residence Living Arrangements: Children(daughter) Available Help at Discharge: Family;Available 24 hours/day Type of Home: House Home Access: Stairs to enter   [employer] of Steps: 4 Home Layout: One level Home Equipment: Cane - single point;Cane - quad Additional Comments: Plans to d/c home with daughter, typically lives alone    Prior Function Level of Independence: Needs assistance   Gait / Transfers Assistance Needed: uses SPC inside, quad cane outside  ADL's / Homemaking Assistance Needed: drives, bird baths, microwaves meals, manages medication/finances, calls in groceries to [employer]        Extremity/Trunk Assessment   Upper Extremity Assessment Upper Extremity Assessment:  Generalized weakness    Lower Extremity Assessment Lower Extremity  Assessment: Generalized weakness;RLE deficits/detail RLE Deficits / Details: residual weakness from stroke    Cervical / Trunk Assessment Cervical / Trunk Assessment: Kyphotic  Communication   Communication: No difficulties  Cognition Arousal/Alertness: Awake/alert Behavior During Therapy: WFL for tasks assessed/performed Overall Cognitive Status: Within Functional Limits for tasks assessed                                 General Comments: WFL for basic mobility, will further assess      General Comments      Exercises     Assessment/Plan    PT Assessment Patient needs continued PT services  PT Problem List Decreased strength;Decreased activity tolerance;Decreased balance;Decreased mobility       PT Treatment Interventions DME instruction;Gait training;Stair training;Functional mobility training;Therapeutic exercise;Therapeutic activities;Balance training;Patient/family education    PT Goals (Current goals can be found in the Care Plan section)  Acute Rehab PT Goals Patient Stated Goal: "go home with my daughter." PT Goal Formulation: With patient Time For Goal Achievement: 02/24/19 Potential to Achieve Goals: Good    Frequency Min 3X/week   Barriers to discharge        Co-evaluation               AM-PAC PT "6 Clicks" Mobility  Outcome Measure Help needed turning from your back to your side while in a flat bed without using bedrails?: None Help needed moving from lying on your back to sitting on the side of a flat bed without using bedrails?: A Little Help needed moving to and from a bed to a chair (including a wheelchair)?: A Little Help needed standing up from a chair using your arms (e.g., wheelchair or bedside chair)?: A Little Help needed to walk in hospital room?: A Little Help needed climbing 3-5 steps with a railing? : A Lot 6 Click Score: 18    End of Session Equipment Utilized During Treatment: Back brace Activity Tolerance:  Other (comment)(limited by diarrhea) Patient left: in chair;with call bell/phone within reach;with chair alarm set Nurse Communication: Mobility status PT Visit Diagnosis: Unsteadiness on feet (R26.81);History of falling (Z91.81);Difficulty in walking, not elsewhere classified (R26.2)    Time: [id number] PT Time Calculation (min) (ACUTE ONLY): 34 min   Charges:   PT Evaluation $PT Eval Moderate Complexity: 1 Mod PT Treatments $Therapeutic Activity: 8-22 mins        Ellamae Sia, PT, DPT Acute Rehabilitation Services Pager [phone] Office [phone]   Willy Eddy 02/10/2019, 9:48 AM

## 2019-02-10 NOTE — Discharge Instructions (Signed)
Lumbar Spine Fracture A lumbar spine fracture is a break in one of the bones of the lower back. Lumbar spine fractures can vary from mild to severe. The most severe types are those that:  Cause the broken bones to move out of place (unstable).  Injure or press on the spinal cord. During recovery, it is normal to have pain and stiffness in the lower back for weeks. What are the causes? This condition may be caused by:  A fall.  A car accident.  A gunshot wound.  A hard, direct hit to the back. What increases the risk? You are more likely to develop this condition if:  You are in a situation that could result in a fall or other violent injury.  You have a condition that causes weakness in the bones (osteoporosis). What are the signs or symptoms? The main symptom of this condition is severe pain in the lower back. If a fracture is complex or severe, there may also be:  A misshapen or swollen area on the lower back.  Limited ability to move an area of the lower back.  Inability to empty the bladder (urinary retention).  Loss of bowel or bladder control (incontinence).  Loss of strength or sensation in the legs, feet, and toes.  Inability to move (paralysis). How is this diagnosed? This condition is diagnosed based on:  A physical exam.  Symptoms and what happened just before they developed.  The results of imaging tests, such as an X-ray, CT scan, or MRI. If your nerves have been damaged, you may also have other tests to find out the extent of the damage. How is this treated? Treatment for this condition depends on how severe the injury is. Most fractures can be treated with:  A back brace.  Bed rest and activity restrictions.  Pain medicine.  Physical therapy. Fractures that are complex, involve multiple bones, or make the spine unstable may require surgery. Surgery is done:  To remove pressure from the nerves or spinal cord.  To stabilize the broken pieces of  bone. Follow these instructions at home: Medicines  Take over-the-counter and prescription medicines only as told by your health care provider.  Do not drive or use heavy machinery while taking prescription pain medicine.  If you are taking prescription pain medicine, take actions to prevent or treat constipation. Your health care provider may recommend that you: ? Drink enough fluid to keep your urine pale yellow. ? Eat foods that are high in fiber, such as fresh fruits and vegetables, whole grains, and beans. ? Limit foods that are high in fat and processed sugars, such as fried or sweet foods. ? Take an over-the-counter or prescription medicine for constipation. If you have a brace:  Wear the back brace as told by your health care provider. Remove it only as told by your health care provider.  Keep the brace clean.  If the brace is not waterproof: ? Do not let it get wet. ? Cover it with a watertight covering when you take a bath or a shower. Activity  Stay in bed (on bed rest) only as directed by your health care provider.  Do exercises to improve motion and strength in your back (physical therapy), if your health care provider tells you to do so.  Return to your normal activities as directed by your health care provider. Ask your health care provider what activities are safe for you. Managing pain, stiffness, and swelling   If directed, put ice  on the injured area: ? If you have a removable brace, remove it as told by your health care provider. ? Put ice in a plastic bag. ? Place a towel between your skin and the bag. ? Leave the ice on for 20 minutes, 2-3 times a day. General instructions  Do not use any products that contain nicotine or tobacco, such as cigarettes and e-cigarettes. These can delay healing after injury. If you need help quitting, ask your health care provider.  Do not drink alcohol. Alcohol can interfere with your treatment.  Keep all follow-up visits  as directed by your health care provider. This is important. ? Failing to follow up as recommended could result in permanent injury, disability, or long-lasting (chronic) pain. Contact a health care provider if:  You have a fever.  Your pain medicine is not helping.  Your pain does not get better over time.  You cannot return to your normal activities as planned or expected. Get help right away if:  You have difficulty breathing.  Your pain is very bad and it suddenly gets worse.  You have numbness, tingling, or weakness in any part of your body.  You are unable to empty your bladder.  You cannot control your bladder or bowels.  You are unable to move any body part (paralysis) that is below the level of your injury.  You vomit.  You have pain in your abdomen. Summary  A lumbar spine fracture is a break in one of the bones of the lower back.  The main symptom of this condition is severe pain in the lower back. If a fracture is complex, there may also be numbness, tingling, or paralysis in the legs.  Treatment depends on how severe the injury is. Most fractures can be treated with a back brace, bed rest and activity restrictions, pain medicine, and physical therapy.  Fractures that are complex, involve multiple bones, or make the spine unstable may require surgery. This information is not intended to replace advice given to you by your health care provider. Make sure you discuss any questions you have with your health care provider. Document Released: 09/23/2006 Document Revised: 07/24/2017 Document Reviewed: 07/24/2017 Elsevier Patient Education  2020 [employer].

## 2019-02-14 LAB — CULTURE, BLOOD (ROUTINE X 2)
Culture: NO GROWTH
Culture: NO GROWTH
Special Requests: ADEQUATE

## 2019-02-23 ENCOUNTER — Emergency Department (HOSPITAL_COMMUNITY): Payer: Medicare Other

## 2019-02-23 ENCOUNTER — Encounter (HOSPITAL_COMMUNITY): Payer: Self-pay | Admitting: Emergency Medicine

## 2019-02-23 ENCOUNTER — Inpatient Hospital Stay (HOSPITAL_COMMUNITY)
Admission: EM | Admit: 2019-02-23 | Discharge: 2019-03-03 | DRG: 872 | Disposition: A | Discharge status: Home Health | Payer: Medicare Other | Attending: Student | Admitting: Student

## 2019-02-23 ENCOUNTER — Other Ambulatory Visit: Payer: Self-pay

## 2019-02-23 DIAGNOSIS — K8020 Calculus of gallbladder without cholecystitis without obstruction: Secondary | ICD-10-CM

## 2019-02-23 DIAGNOSIS — Z7982 Long term (current) use of aspirin: Secondary | ICD-10-CM

## 2019-02-23 DIAGNOSIS — Z8673 Personal history of transient ischemic attack (TIA), and cerebral infarction without residual deficits: Secondary | ICD-10-CM | POA: Diagnosis not present

## 2019-02-23 DIAGNOSIS — Z887 Allergy status to serum and vaccine status: Secondary | ICD-10-CM

## 2019-02-23 DIAGNOSIS — Z923 Personal history of irradiation: Secondary | ICD-10-CM

## 2019-02-23 DIAGNOSIS — I480 Paroxysmal atrial fibrillation: Secondary | ICD-10-CM | POA: Diagnosis present

## 2019-02-23 DIAGNOSIS — E860 Dehydration: Secondary | ICD-10-CM | POA: Diagnosis present

## 2019-02-23 DIAGNOSIS — R700 Elevated erythrocyte sedimentation rate: Secondary | ICD-10-CM

## 2019-02-23 DIAGNOSIS — Z981 Arthrodesis status: Secondary | ICD-10-CM

## 2019-02-23 DIAGNOSIS — I69341 Monoplegia of lower limb following cerebral infarction affecting right dominant side: Secondary | ICD-10-CM

## 2019-02-23 DIAGNOSIS — Z89421 Acquired absence of other right toe(s): Secondary | ICD-10-CM

## 2019-02-23 DIAGNOSIS — E1142 Type 2 diabetes mellitus with diabetic polyneuropathy: Secondary | ICD-10-CM | POA: Diagnosis present

## 2019-02-23 DIAGNOSIS — D72829 Elevated white blood cell count, unspecified: Secondary | ICD-10-CM

## 2019-02-23 DIAGNOSIS — A4181 Sepsis due to Enterococcus: Secondary | ICD-10-CM | POA: Diagnosis present

## 2019-02-23 DIAGNOSIS — Z794 Long term (current) use of insulin: Secondary | ICD-10-CM | POA: Diagnosis not present

## 2019-02-23 DIAGNOSIS — E785 Hyperlipidemia, unspecified: Secondary | ICD-10-CM | POA: Diagnosis present

## 2019-02-23 DIAGNOSIS — C8510 Unspecified B-cell lymphoma, unspecified site: Secondary | ICD-10-CM | POA: Diagnosis present

## 2019-02-23 DIAGNOSIS — R7881 Bacteremia: Secondary | ICD-10-CM | POA: Diagnosis not present

## 2019-02-23 DIAGNOSIS — E119 Type 2 diabetes mellitus without complications: Secondary | ICD-10-CM | POA: Diagnosis present

## 2019-02-23 DIAGNOSIS — Z8249 Family history of ischemic heart disease and other diseases of the circulatory system: Secondary | ICD-10-CM

## 2019-02-23 DIAGNOSIS — E1159 Type 2 diabetes mellitus with other circulatory complications: Secondary | ICD-10-CM | POA: Diagnosis not present

## 2019-02-23 DIAGNOSIS — Z8572 Personal history of non-Hodgkin lymphomas: Secondary | ICD-10-CM | POA: Diagnosis not present

## 2019-02-23 DIAGNOSIS — Z87891 Personal history of nicotine dependence: Secondary | ICD-10-CM

## 2019-02-23 DIAGNOSIS — M4856XA Collapsed vertebra, not elsewhere classified, lumbar region, initial encounter for fracture: Secondary | ICD-10-CM | POA: Diagnosis present

## 2019-02-23 DIAGNOSIS — N390 Urinary tract infection, site not specified: Secondary | ICD-10-CM | POA: Diagnosis present

## 2019-02-23 DIAGNOSIS — R531 Weakness: Secondary | ICD-10-CM

## 2019-02-23 DIAGNOSIS — Z20828 Contact with and (suspected) exposure to other viral communicable diseases: Secondary | ICD-10-CM | POA: Diagnosis present

## 2019-02-23 DIAGNOSIS — Z9221 Personal history of antineoplastic chemotherapy: Secondary | ICD-10-CM | POA: Diagnosis not present

## 2019-02-23 DIAGNOSIS — R296 Repeated falls: Secondary | ICD-10-CM | POA: Diagnosis present

## 2019-02-23 DIAGNOSIS — Z7902 Long term (current) use of antithrombotics/antiplatelets: Secondary | ICD-10-CM

## 2019-02-23 DIAGNOSIS — B952 Enterococcus as the cause of diseases classified elsewhere: Secondary | ICD-10-CM | POA: Diagnosis not present

## 2019-02-23 DIAGNOSIS — M549 Dorsalgia, unspecified: Secondary | ICD-10-CM | POA: Diagnosis not present

## 2019-02-23 DIAGNOSIS — R7982 Elevated C-reactive protein (CRP): Secondary | ICD-10-CM | POA: Diagnosis present

## 2019-02-23 DIAGNOSIS — I4891 Unspecified atrial fibrillation: Secondary | ICD-10-CM | POA: Diagnosis present

## 2019-02-23 DIAGNOSIS — Z880 Allergy status to penicillin: Secondary | ICD-10-CM

## 2019-02-23 DIAGNOSIS — Z87440 Personal history of urinary (tract) infections: Secondary | ICD-10-CM

## 2019-02-23 DIAGNOSIS — I10 Essential (primary) hypertension: Secondary | ICD-10-CM | POA: Diagnosis not present

## 2019-02-23 DIAGNOSIS — E1165 Type 2 diabetes mellitus with hyperglycemia: Secondary | ICD-10-CM

## 2019-02-23 DIAGNOSIS — Z888 Allergy status to other drugs, medicaments and biological substances status: Secondary | ICD-10-CM

## 2019-02-23 DIAGNOSIS — Z79899 Other long term (current) drug therapy: Secondary | ICD-10-CM

## 2019-02-23 DIAGNOSIS — R399 Unspecified symptoms and signs involving the genitourinary system: Secondary | ICD-10-CM

## 2019-02-23 LAB — BASIC METABOLIC PANEL
Anion gap: 12 (ref 5–15)
BUN: 32 mg/dL — ABNORMAL HIGH (ref 8–23)
CO2: 23 mmol/L (ref 22–32)
Calcium: 8.7 mg/dL — ABNORMAL LOW (ref 8.9–10.3)
Chloride: 101 mmol/L (ref 98–111)
Creatinine, Ser: 1.08 mg/dL (ref 0.61–1.24)
GFR calc Af Amer: >60 mL/min (ref >60)
GFR calc non Af Amer: >60 mL/min (ref >60)
Glucose, Bld: 137 mg/dL — ABNORMAL HIGH (ref 70–99)
Potassium: 4.4 mmol/L (ref 3.5–5.1)
Sodium: 136 mmol/L (ref 135–145)

## 2019-02-23 LAB — URINALYSIS, ROUTINE W REFLEX MICROSCOPIC
Bilirubin Urine: NEGATIVE
Glucose, UA: NEGATIVE mg/dL
Ketones, ur: NEGATIVE mg/dL
Nitrite: NEGATIVE
Protein, ur: NEGATIVE mg/dL
Specific Gravity, Urine: 1.035 — ABNORMAL HIGH (ref 1.005–1.030)
pH: 6 (ref 5.0–8.0)

## 2019-02-23 LAB — SEDIMENTATION RATE: Sed Rate: 57 mm/hr — ABNORMAL HIGH (ref 0–16)

## 2019-02-23 LAB — CBC
HCT: 46.7 % (ref 39.0–52.0)
Hemoglobin: 15.5 g/dL (ref 13.0–17.0)
MCH: 30.3 pg (ref 26.0–34.0)
MCHC: 33.2 g/dL (ref 30.0–36.0)
MCV: 91.4 fL (ref 80.0–100.0)
Platelets: 225 10*3/uL (ref 150–400)
RBC: 5.11 MIL/uL (ref 4.22–5.81)
RDW: 13.2 % (ref 11.5–15.5)
WBC: 23.1 10*3/uL — ABNORMAL HIGH (ref 4.0–10.5)
nRBC: 0 % (ref 0.0–0.2)

## 2019-02-23 LAB — SARS CORONAVIRUS 2 BY RT PCR (HOSPITAL ORDER, PERFORMED IN ~~LOC~~ HOSPITAL LAB): SARS Coronavirus 2: NEGATIVE

## 2019-02-23 LAB — C-REACTIVE PROTEIN: CRP: 21.7 mg/dL — ABNORMAL HIGH (ref <1.0)

## 2019-02-23 LAB — GLUCOSE, CAPILLARY
Glucose-Capillary: 120 mg/dL — ABNORMAL HIGH (ref 70–99)
Glucose-Capillary: 62 mg/dL — ABNORMAL LOW (ref 70–99)

## 2019-02-23 LAB — CBG MONITORING, ED: Glucose-Capillary: 105 mg/dL — ABNORMAL HIGH (ref 70–99)

## 2019-02-23 LAB — LACTIC ACID, PLASMA: Lactic Acid, Venous: 1.9 mmol/L (ref 0.5–1.9)

## 2019-02-23 MED ORDER — INSULIN ASPART 100 UNIT/ML ~~LOC~~ SOLN: 0.0000 [IU] | Freq: Every day | SUBCUTANEOUS | Status: DC

## 2019-02-23 MED ORDER — SODIUM CHLORIDE 0.9 % IV BOLUS
1000.0000 mL | Freq: Once | INTRAVENOUS | Status: AC
  Administered 2019-02-23: 10:00:00 1000 mL via INTRAVENOUS

## 2019-02-23 MED ORDER — ONDANSETRON HCL 4 MG/2ML IJ SOLN: 4.0000 mg | Freq: Four times a day (QID) | INTRAMUSCULAR | Status: DC | PRN

## 2019-02-23 MED ORDER — ONDANSETRON HCL 4 MG PO TABS: 4.0000 mg | ORAL_TABLET | Freq: Four times a day (QID) | ORAL | Status: DC | PRN

## 2019-02-23 MED ORDER — SODIUM CHLORIDE 0.9 % IV SOLN
1.0000 g | Freq: Once | INTRAVENOUS | Status: AC
  Administered 2019-02-23: 10:00:00 1 g via INTRAVENOUS
  Filled 2019-02-23: qty 10

## 2019-02-23 MED ORDER — IOHEXOL 300 MG/ML  SOLN
100.0000 mL | Freq: Once | INTRAMUSCULAR | Status: AC | PRN
  Administered 2019-02-23: 100 mL via INTRAVENOUS

## 2019-02-23 MED ORDER — INSULIN ASPART 100 UNIT/ML ~~LOC~~ SOLN
0.0000 [IU] | Freq: Three times a day (TID) | SUBCUTANEOUS | Status: DC
  Administered 2019-02-25 (×2): 2 [IU] via SUBCUTANEOUS
  Administered 2019-02-27 – 2019-03-01 (×3): 1 [IU] via SUBCUTANEOUS
  Administered 2019-03-02: 2 [IU] via SUBCUTANEOUS

## 2019-02-23 MED ORDER — SODIUM CHLORIDE 0.9 % IV SOLN
1.0000 g | INTRAVENOUS | Status: DC
  Administered 2019-02-23 – 2019-02-25 (×2): 1 g via INTRAVENOUS
  Filled 2019-02-23: qty 10
  Filled 2019-02-23: qty 1

## 2019-02-23 MED ORDER — ENOXAPARIN SODIUM 40 MG/0.4ML ~~LOC~~ SOLN
40.0000 mg | SUBCUTANEOUS | Status: DC
  Administered 2019-02-24 – 2019-02-25 (×2): 40 mg via SUBCUTANEOUS
  Filled 2019-02-23 (×6): qty 0.4

## 2019-02-23 MED ORDER — FENTANYL CITRATE (PF) 100 MCG/2ML IJ SOLN
50.0000 ug | Freq: Once | INTRAMUSCULAR | Status: AC
  Administered 2019-02-23: 10:00:00 50 ug via INTRAVENOUS
  Filled 2019-02-23: qty 2

## 2019-02-23 MED ORDER — MORPHINE SULFATE (PF) 4 MG/ML IV SOLN
4.0000 mg | Freq: Once | INTRAVENOUS | Status: AC
  Administered 2019-02-23: 4 mg via INTRAVENOUS
  Filled 2019-02-23: qty 1

## 2019-02-23 MED ORDER — SODIUM CHLORIDE 0.9% FLUSH
3.0000 mL | Freq: Once | INTRAVENOUS | Status: AC
  Administered 2019-02-23: 3 mL via INTRAVENOUS

## 2019-02-23 MED ORDER — SODIUM CHLORIDE 0.9 % IV SOLN
INTRAVENOUS | Status: DC
  Administered 2019-02-23 – 2019-02-24 (×2): via INTRAVENOUS
  Administered 2019-02-25: 100 mL/h via INTRAVENOUS

## 2019-02-23 MED ORDER — GADOBUTROL 1 MMOL/ML IV SOLN
7.5000 mL | Freq: Once | INTRAVENOUS | Status: AC | PRN
  Administered 2019-02-23: 7.5 mL via INTRAVENOUS

## 2019-02-23 NOTE — ED Notes (Signed)
Patient transported to Ultrasound 

## 2019-02-23 NOTE — ED Provider Notes (Addendum)
Signed out by Dr Darl Householder that patient with general weakness, too weak to walk, chills, urinary symptoms but with under-whelming ua, wbc elevated, and that when ED workup resulted, to admit to hospitalists.   MRi reviewed by me - no disciitis/acute infection.  U/s - gallstones, no cholecystitis.   Recheck chest cta. abd soft nt.   Pt with small skin ulceration plantar aspect left foot, proximal to 3rd toe. No tenderness, no erythema, no necrotic or gangrenous tissue, no malodor, no discharge.   Source infection not entirely clear. Has urinary symptoms, was given rocephin. Cultures pending.  Of note, recent u cx with atypical bacteria, Kocuria - ?whether possible recurrent infection.  Will admit to medicine service. Observation.       Lajean Saver, MD 02/23/19 [phone]

## 2019-02-23 NOTE — ED Notes (Signed)
ED TO INPATIENT HANDOFF REPORT  ED Nurse Name and Phone #: Benjamine Mola [phone]  S Name/Age/Gender [name] 78 y.o. male Room/Bed: 013C/013C  Code Status   Code Status: Prior  Home/SNF/Other Home Patient oriented to: self, place, time and situation Is this baseline? Yes   Triage Complete: Triage complete  Chief Complaint sick  Triage Note Patient with increase generalized weakness in the last week.  He has been diagnosed with recent UTI. Patient having trouble with mobility.  No chest pain, no shortness of breath, CAOx4.     Allergies Allergies  Allergen Reactions  . Prevnar [Pneumococcal 13-Val Conj Vacc] Anaphylaxis  . Actos [Pioglitazone Hydrochloride] Other (See Comments)    Afib  . Hydrochlorothiazide Other (See Comments)    Lower potassium to low  . Penicillins Rash    Tolerated cefepime, ceftriaxone  Did it involve swelling of the face/tongue/throat, SOB, or low BP? No Did it involve sudden or severe rash/hives, skin peeling, or any reaction on the inside of your mouth or nose?Yes Did you need to seek medical attention at a hospital or doctor's office. Yes When did it last happen?78 years old If all above answers are "NO", may proceed with cephalosporin use.    Level of Care/Admitting Diagnosis ED Disposition    ED Disposition Condition [facility] Area: [place] [[id number]]  Level of Care: Med-Surg [16]  Covid Evaluation: Asymptomatic Screening Protocol (No Symptoms)  Diagnosis: Weakness generalized [id number]  Admitting Physician: Elwyn Reach [[id number]]  Attending Physician: Elwyn Reach [[id number]]  Estimated length of stay: past midnight tomorrow  Certification:: I certify this patient will need inpatient services for at least 2 midnights  PT Class (Do Not Modify): Inpatient [101]  PT Acc Code (Do Not Modify): Private [1]       B Medical/Surgery History Past Medical History:  Diagnosis Date  . A-fib  ([place])   . 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 B-cell lymphoma ([place])   . Cataract   . Charcot-Marie-Tooth disease   . Diabetes mellitus   . Hypertension   . 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 SIRS (systemic inflammatory response syndrome) ([place]) 09/2015  . Stroke ([place])   . Weakness    Past Surgical History:  Procedure Laterality Date  . ANKLE FUSION Left   . CATARACT EXTRACTION    . CIRCUMCISION    . TOE AMPUTATION     right foot third toe   . URETHRA SURGERY       A IV Location/Drains/Wounds Patient Lines/Drains/Airways Status   Active Line/Drains/Airways    Name:   Placement date:   Placement time:   Site:   Days:   Peripheral IV 02/23/19 Left Antecubital   02/23/19    0939    Antecubital   less than 1   Pressure Injury 06/28/17 Unstageable - Full thickness tissue loss in which the base of the ulcer is covered by slough (yellow, tan, gray, green or brown) and/or eschar (tan, brown or black) in the wound bed. Pink open blister- from patient sitting on toi   06/28/17    1727     605          Intake/Output Last 24 hours  Intake/Output Summary (Last 24 hours) at 02/23/2019 2205 Last data filed at 02/23/2019 1056 Gross per 24 hour  Intake 1132.3 ml  Output -  Net 1132.3 ml    Labs/Imaging  Results for orders placed or performed during the hospital encounter of 02/23/19 (from the past 48 hour(s))  Basic metabolic panel     Status: Abnormal   Collection Time: 02/23/19  6:52 AM  Result Value Ref Range   Sodium 136 135 - 145 mmol/L   Potassium 4.4 3.5 - 5.1 mmol/L   Chloride 101 98 - 111 mmol/L   CO2 23 22 - 32 mmol/L   Glucose, Bld 137 (H) 70 - 99 mg/dL   BUN 32 (H) 8 - 23 mg/dL   Creatinine, Ser 1.08 0.61 - 1.24 mg/dL   Calcium 8.7 (L) 8.9 - 10.3 mg/dL   GFR calc non Af Amer >60 >60 mL/min   GFR calc Af Amer >60 >60 mL/min   Anion gap 12 5 - 15    Comment: Performed at [facility], [place] [address] [place], [place]  [address]  CBC     Status: Abnormal   Collection Time: 02/23/19  6:52 AM  Result Value Ref Range   WBC 23.1 (H) 4.0 - 10.5 K/uL   RBC 5.11 4.22 - 5.81 MIL/uL   Hemoglobin 15.5 13.0 - 17.0 g/dL   HCT 46.7 39.0 - 52.0 %   MCV 91.4 80.0 - 100.0 fL   MCH 30.3 26.0 - 34.0 pg   MCHC 33.2 30.0 - 36.0 g/dL   RDW 13.2 11.5 - 15.5 %   Platelets 225 150 - 400 K/uL   nRBC 0.0 0.0 - 0.2 %    Comment: Performed at [facility], [place] [address] [place], [place] [address]  Lactic acid, plasma     Status: None   Collection Time: 02/23/19  9:52 AM  Result Value Ref Range   Lactic Acid, Venous 1.9 0.5 - 1.9 mmol/L    Comment: Performed at [facility] [address], [place], [place] [address]  SARS Coronavirus 2 [facility] order, Performed in [facility] hospital lab) Nasopharyngeal Nasopharyngeal Swab     Status: None   Collection Time: 02/23/19 10:01 AM   Specimen: Nasopharyngeal Swab  Result Value Ref Range   SARS Coronavirus 2 NEGATIVE NEGATIVE    Comment: (NOTE) If result is NEGATIVE SARS-CoV-2 target nucleic acids are NOT DETECTED. The SARS-CoV-2 RNA is generally detectable in upper and lower  respiratory specimens during the acute phase of infection. The lowest  concentration of SARS-CoV-2 viral copies this assay can detect is 250  copies / mL. A negative result does not preclude SARS-CoV-2 infection  and should not be used as the sole basis for treatment or other  patient management decisions.  A negative result may occur with  improper specimen collection / handling, submission of specimen other  than nasopharyngeal swab, presence of viral mutation(s) within the  areas targeted by this assay, and inadequate number of viral copies  (<250 copies / mL). A negative result must be combined with clinical  observations, patient history, and epidemiological information. If result is POSITIVE SARS-CoV-2 target nucleic acids are DETECTED. The SARS-CoV-2 RNA is generally detectable in  upper and lower  respiratory specimens dur ing the acute phase of infection.  Positive  results are indicative of active infection with SARS-CoV-2.  Clinical  correlation with patient history and other diagnostic information is  necessary to determine patient infection status.  Positive results do  not rule out bacterial infection or co-infection with other viruses. If result is PRESUMPTIVE POSTIVE SARS-CoV-2 nucleic acids MAY BE PRESENT.   A presumptive positive result was  obtained on the submitted specimen  and confirmed on repeat testing.  While 2019 novel coronavirus  (SARS-CoV-2) nucleic acids may be present in the submitted sample  additional confirmatory testing may be necessary for epidemiological  and / or clinical management purposes  to differentiate between  SARS-CoV-2 and other Sarbecovirus currently known to infect humans.  If clinically indicated additional testing with an alternate test  methodology [phone]) is advised. The SARS-CoV-2 RNA is generally  detectable in upper and lower respiratory sp ecimens during the acute  phase of infection. The expected result is Negative. Fact Sheet for Patients:  StrictlyIdeas.no Fact Sheet for Healthcare Providers: BankingDealers.co.za This test is not yet approved or cleared by the [place] FDA and has been authorized for detection and/or diagnosis of SARS-CoV-2 by FDA under an Emergency Use Authorization (EUA).  This EUA will remain in effect (meaning this test can be used) for the duration of the COVID-19 declaration under Section 564(b)(1) of the Act, 21 U.S.C. section 360bbb-3(b)(1), unless the authorization is terminated or revoked sooner. Performed at [facility], [place] [address] [place], [place] [address]   CBG monitoring, ED     Status: Abnormal   Collection Time: 02/23/19 10:29 AM  Result Value Ref Range   Glucose-Capillary 105 (H) 70 - 99 mg/dL  Urinalysis,  Routine w reflex microscopic     Status: Abnormal   Collection Time: 02/23/19 10:57 AM  Result Value Ref Range   Color, Urine YELLOW YELLOW   APPearance CLEAR CLEAR   Specific Gravity, Urine 1.035 (H) 1.005 - 1.030   pH 6.0 5.0 - 8.0   Glucose, UA NEGATIVE NEGATIVE mg/dL   Hgb urine dipstick SMALL (A) NEGATIVE   Bilirubin Urine NEGATIVE NEGATIVE   Ketones, ur NEGATIVE NEGATIVE mg/dL   Protein, ur NEGATIVE NEGATIVE mg/dL   Nitrite NEGATIVE NEGATIVE   Leukocytes,Ua TRACE (A) NEGATIVE   RBC / HPF 0-5 0 - 5 RBC/hpf   WBC, UA 0-5 0 - 5 WBC/hpf   Bacteria, UA RARE (A) NONE SEEN   Squamous Epithelial / LPF 0-5 0 - 5   Mucus PRESENT    Hyaline Casts, UA PRESENT     Comment: Performed at [facility], [address] [place], [place] [address]  Sedimentation rate     Status: Abnormal   Collection Time: 02/23/19 12:40 PM  Result Value Ref Range   Sed Rate 57 (H) 0 - 16 mm/hr    Comment: Performed at [facility] [address] [place], [place] [address]  C-reactive protein     Status: Abnormal   Collection Time: 02/23/19 12:40 PM  Result Value Ref Range   CRP 21.7 (H) <1.0 mg/dL    Comment: Performed at [facility], [place] [address] [place], [place] [address]   Mr Lumbar Spine W Wo Contrast  Result Date: 02/23/2019 CLINICAL DATA:  Back pain.  Recent fall. EXAM: MRI LUMBAR SPINE WITHOUT AND WITH CONTRAST TECHNIQUE: Multiplanar and multiecho pulse sequences of the lumbar spine were obtained without and with intravenous contrast. CONTRAST:  7.5 mL Gadavist intravenous contrast. COMPARISON:  CT lumbar spine from same day. FINDINGS: Segmentation: Transitional lumbosacral anatomy again identified with fully lumbarized S1 segment, in keeping with numbering system used on prior studies. Alignment:  Physiologic. Vertebrae: Acute minimally depressed compression fractures of the L1 superior endplate and L3 inferior endplate with associated marrow edema and enhancement. Additional  acute nondisplaced fracture of the lower sacrum at the S4-S5 level.  No evidence of discitis. No focal bone lesion. Conus medullaris and cauda equina: Conus extends to the L1 level. Conus and cauda equina appear normal. No intradural enhancement. Paraspinal and other soft tissues: Negative. Disc levels: T12-L1:  Negative. L1-L2:  Negative. L2-L3:  Negative. L3-L4:  Mild disc bulging.  No stenosis. L4-L5: Mild disc bulging with superimposed broad-based left foraminal disc protrusion encroaching on the exiting left L4 nerve root. Mild bilateral facet arthropathy. Mild left neuroforaminal stenosis. No spinal canal or right neuroforaminal stenosis. L5-S1: Mild disc bulging, endplate spurring, and bilateral facet arthropathy. Mild to moderate right lateral recess stenosis. Mild left greater than right neuroforaminal stenosis. No spinal canal stenosis. S1-S2: Negative. IMPRESSION: 1. Acute minimally depressed compression fractures of the L1 superior endplate and L3 inferior endplate. No retropulsion. 2. Additional acute nondisplaced fracture of the lower sacrum. 3. Mild spondylosis at L4-L5 and L5-S1 as described above. Electronically Signed   By: Titus Dubin M.D.   On: 02/23/2019 15:11   Ct Abdomen Pelvis W Contrast  Result Date: 02/23/2019 CLINICAL DATA:  78 year old male with abdominal pain. EXAM: CT ABDOMEN AND PELVIS WITH CONTRAST TECHNIQUE: Multidetector CT imaging of the abdomen and pelvis was performed using the standard protocol following bolus administration of intravenous contrast. CONTRAST:  116mL OMNIPAQUE IOHEXOL 300 MG/ML  SOLN COMPARISON:  Lumbar spine CT dated 02/23/2019 and CT of the abdomen pelvis dated 10/07/2012. FINDINGS: Lower chest: Minimal bibasilar linear atelectasis/scarring. The visualized lung bases are clear. Advanced multi vessel coronary vascular calcification noted. No intra-abdominal free air or free fluid. Hepatobiliary: The liver is unremarkable. No intrahepatic biliary ductal  dilatation. Multiple small stones noted within the gallbladder. No pericholecystic fluid or evidence of acute cholecystitis by CT. Ultrasound may provide better evaluation if there is clinical concern for acute cholecystitis. Pancreas: Unremarkable. No pancreatic ductal dilatation or surrounding inflammatory changes. Spleen: There is a 1 cm calcified splenic granuloma. Several additional splenic hypodense lesions are not characterized but may represent cysts or hemangioma. Adrenals/Urinary Tract: The adrenal glands are unremarkable. There is no hydronephrosis on either side. There is symmetric enhancement and excretion of contrast by both kidneys. Bilateral renal cysts and smaller hypodense lesions which are too small to characterize. The largest cyst measures approximately 15 mm in the upper pole the right kidney. The visualized ureters appear unremarkable. There is trabeculated appearance of the bladder wall likely related to chronic bladder outlet obstruction. Stomach/Bowel: There is no bowel obstruction or active inflammation. The appendix is normal. Vascular/Lymphatic: Advanced aortoiliac atherosclerotic disease. The IVC is unremarkable. No portal venous gas. There is no adenopathy. Reproductive: Enlarged prostate gland measuring 5 cm in transverse axial diameter. Other: None Musculoskeletal: Osteopenia with degenerative changes of the spine. Fracture of the inferior endplate of the L3 extending to the anterior cortex as seen on the lumbar spine CT of table 02/08/2019. No new fracture. IMPRESSION: 1. No acute intra-abdominal or pelvic pathology. No bowel obstruction or active inflammation. Normal appendix. 2. Cholelithiasis. 3. L3 inferior endplate compression fracture. 4. Advanced aortic Atherosclerosis (ICD10-I70.0). Electronically Signed   By: Anner Crete M.D.   On: 02/23/2019 11:01   Ct L-spine No Charge  Result Date: 02/23/2019 CLINICAL DATA:  78 year old male with back pain. EXAM: CT LUMBAR SPINE  WITHOUT CONTRAST TECHNIQUE: Multidetector CT imaging of the lumbar spine was performed without intravenous contrast administration. Multiplanar CT image reconstructions were also generated. COMPARISON:  Lumbar spine CT dated 02/08/2019 and CT of the abdomen pelvis dated 02/23/2019 FINDINGS: Segmentation: 6 lumbar type vertebra. The  lowermost vertebra is designated as a lumbarized S1. Alignment: No acute subluxation. Vertebrae: Fracture inferior endplate of the L3 extending into the anterior vertebral body cortex. This has not significantly changed since the prior CT. No retropulsion. No other acute fracture. The bones are osteopenic. Paraspinal and other soft tissues: Negative. Disc levels: Multilevel degenerative disc disease. There is disc desiccation and vacuum phenomena at L4-L5 and L5-S1. The neural foramina remain patent. Mild diffuse disc bulge at L3-L4, L4-5, and L5-S1. IMPRESSION: 1. No new fracture. Fracture of the inferior endplate of L3 extending into the anterior vertebral body cortex, unchanged compared to the prior CT. No retropulsion. 2. Osteopenia with multilevel degenerative disc disease and mild diffuse disc bulge at L3-L4, L4-5, and L5-S1. Electronically Signed   By: Anner Crete M.D.   On: 02/23/2019 10:54   Dg Chest Port 1 View  Result Date: 02/23/2019 CLINICAL DATA:  Weakness. EXAM: PORTABLE CHEST 1 VIEW COMPARISON:  Radiograph of July 05, 2017. FINDINGS: The heart size and mediastinal contours are within normal limits. Atherosclerosis of thoracic aorta is noted. Hypoinflation of the lungs is noted with minimal bibasilar subsegmental atelectasis. No pneumothorax or pleural effusion is noted. The visualized skeletal structures are unremarkable. IMPRESSION: Hypoinflation of the lungs with minimal bibasilar subsegmental atelectasis. Aortic Atherosclerosis (ICD10-I70.0). Electronically Signed   By: Marijo Conception M.D.   On: 02/23/2019 09:50   US Abdomen Limited Ruq  Result Date:  02/23/2019 CLINICAL DATA:  Abdominal pain.  Gallstones on CT earlier this day. EXAM: ULTRASOUND ABDOMEN LIMITED RIGHT UPPER QUADRANT COMPARISON:  Abdominal CT earlier this day. FINDINGS: Gallbladder: Physiologically distended with intraluminal gallstone. Normal wall thickness of 2 mm. No pericholecystic fluid. No sonographic Murphy sign noted by sonographer. Common bile duct: Diameter: 6 mm, normal. Liver: No focal lesion identified. Within normal limits in parenchymal echogenicity. Portal vein is patent on color Doppler imaging with normal direction of blood flow towards the liver. Other: None. IMPRESSION: Gallstones without sonographic findings of acute cholecystitis. No biliary dilatation. Electronically Signed   By: Keith Rake M.D.   On: 02/23/2019 19:27    Pending Labs Unresulted Labs (From admission, onward)    Start     Ordered   02/23/19 0921  Blood culture (routine x 2)  BLOOD CULTURE X 2,   STAT     02/23/19 0921   02/23/19 0921  Urine culture  ONCE - STAT,   STAT     02/23/19 0921   Signed and Held  CBC  (enoxaparin (LOVENOX)    CrCl >/= 30 ml/min)  Once,   R    Comments: Baseline for enoxaparin therapy IF NOT ALREADY DRAWN.  Notify MD if PLT < 100 K.    Signed and Held   Signed and Held  Creatinine, serum  (enoxaparin (LOVENOX)    CrCl >/= 30 ml/min)  Once,   R    Comments: Baseline for enoxaparin therapy IF NOT ALREADY DRAWN.    Signed and Held   Signed and Held  Creatinine, serum  (enoxaparin (LOVENOX)    CrCl >/= 30 ml/min)  Weekly,   R    Comments: while on enoxaparin therapy    Signed and Held   Signed and Held  Comprehensive metabolic panel  Tomorrow morning,   R     Signed and Held   Signed and Held  CBC  Tomorrow morning,   R     Signed and Held   Signed and Held  TSH  Once,  R     Signed and Held          Vitals/Pain Today's Vitals   02/23/19 1924 02/23/19 2008 02/23/19 2014 02/23/19 2130  BP:   139/60   Pulse:   76   Resp:   16   Temp:      TempSrc:       SpO2:   95%   Weight:      Height:      PainSc: 0-No pain 0-No pain  0-No pain    Isolation Precautions No active isolations  Medications Medications  sodium chloride flush (NS) 0.9 % injection 3 mL (3 mLs Intravenous Given 02/23/19 0957)  sodium chloride 0.9 % bolus 1,000 mL (0 mLs Intravenous Stopped 02/23/19 1056)  cefTRIAXone (ROCEPHIN) 1 g in sodium chloride 0.9 % 100 mL IVPB (0 g Intravenous Stopped 02/23/19 1026)  fentaNYL (SUBLIMAZE) injection 50 mcg (50 mcg Intravenous Given 02/23/19 0955)  iohexol (OMNIPAQUE) 300 MG/ML solution 100 mL (100 mLs Intravenous Contrast Given 02/23/19 1040)  gadobutrol (GADAVIST) 1 MMOL/ML injection 7.5 mL (7.5 mLs Intravenous Contrast Given 02/23/19 1441)  morphine 4 MG/ML injection 4 mg (4 mg Intravenous Given 02/23/19 1802)    Mobility walks with person assist Low fall risk   Focused Assessments MSK   R Recommendations: See Admitting Provider Note  Report given to:   Additional Notes:

## 2019-02-23 NOTE — H&P (Signed)
History and Physical   [name] [id number] DOB: Sep 08, 1940 DOA: 02/23/2019  Referring MD/NP/PA: Dr. Ashok Cordia  PCP: Alroy Dust, L.Marlou Sa, MD   Outpatient Specialists: None  Patient coming from: Home  Chief Complaint: Generalized weakness  HPI: [name] is a 78 y.o. male with medical history significant of B-cell lymphoma, hypertension, diabetes, atrial fibrillation and previous CVA who came to the ER with generalized weakness and unable to walk.  Patient has had progressive weakness for the last week.  He was recently diagnosed with UTI and treated with IV Rocephin.  Patient has not fully recovered since then.  He has continued to be weak and debilitated.  Patient was seen today and worked up for his generalized weakness which has made it impossible for him to even get out of bed.  He has had urinalysis that suggest possible UTI still.  It is still not impressive.  Patient continues to have urinary symptoms.  Work-up including abdominal ultrasound showed gallstones but no cholecystitis.  MRI showed no evidence of discitis or any disc issues.  He was noted to have mild fever.  Patient is being admitted with generalized weakness probably UTI related..  ED Course: Temperature is 98.9 blood pressure 150/61 pulse 93 respiratory of 20 oxygen sat 98% on room air.  He had glucose of 62 at some point in the ER indicating occasional hypoglycemia.  CRP 21.7 and sed rate of 57.  Sodium 136 potassium 4.4 chloride 101 CO2 23 with glucose 137.  BUN 32 creatinine 1.08 calcium 8.7 with a gap of 12.  Lactic acid 1.9.  White count is 23 point thousand hemoglobin 15.5 and platelet 225.  CT abdomen pelvis and the MRI of the lumbar spine showed mild L1 superior endplate compression fracture which is acute.  Review of Systems: As per HPI otherwise 10 point review of systems negative.    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 He has a 70.00 pack-year smoking history. He quit smokeless tobacco use about 28 years ago. He reports that he does not drink alcohol or use drugs.  Allergies  Allergen Reactions   Prevnar [Pneumococcal 13-Val Conj Vacc] Anaphylaxis   Actos [Pioglitazone Hydrochloride] Other (See Comments)    Afib   Hydrochlorothiazide Other (See Comments)    Lower potassium to low   Penicillins Rash    Tolerated cefepime, ceftriaxone  Did it involve swelling of the face/tongue/throat, SOB, or low BP? No Did it involve sudden or severe rash/hives, skin peeling, or any reaction on the inside of your mouth or nose?Yes Did you need to seek medical attention at a hospital or doctor's office. Yes When did it last happen?78 years old If all above answers are NO, may proceed with cephalosporin use.  Family History  Problem Relation Age of Onset   Hypertension Mother    Hypertension Father      Prior to Admission medications   Medication Sig Start Date End Date Taking? Authorizing Provider  amLODipine (NORVASC) 10 MG tablet Take 1 tablet (10 mg total) by mouth daily. Patient taking differently: Take 5 mg by mouth 2 (two) times daily.  07/09/17   Angiulli, Lavon Paganini, PA-C  aspirin EC 81 MG EC tablet Take 1 tablet (81 mg total) by mouth daily. Patient taking differently: Take 81 mg by mouth 3 (three) times a week. Monday, Wednesday and Friday 08/19/15   Florencia Reasons, MD  Insulin Degludec (TRESIBA)  100 UNIT/ML SOLN Inject 18 Units into the skin daily.    [provider]  lisinopril (PRINIVIL,ZESTRIL) 2.5 MG tablet Take 3 tablets (7.5 mg total) by mouth daily. Patient taking differently: Take 2.5 mg by mouth 2 (two) times daily.  07/09/17   Angiulli, Lavon Paganini, PA-C  metoprolol tartrate (LOPRESSOR) 25 MG tablet Take 1 tablet (25 mg total) by mouth 2 (two) times daily. Patient taking differently: Take 12.5 mg by mouth 2 (two) times daily.  07/09/17   Angiulli, Lavon Paganini, PA-C  simvastatin (ZOCOR) 20 MG tablet Take 1 tablet (20 mg total) by mouth at bedtime. 07/09/17   Cathlyn Parsons, PA-C    Physical Exam: Vitals:   02/23/19 1315 02/23/19 1330 02/23/19 1530 02/23/19 1806  BP: (!) 108/55 120/66 (!) 127/58 (!) 109/49  Pulse: 79 77 77 77  Resp: 16 16 16 14   Temp:      TempSrc:      SpO2: 96% 97% 96% 98%  Weight:      Height:          Constitutional: Weak and chronically ill looking Vitals:   02/23/19 1315 02/23/19 1330 02/23/19 1530 02/23/19 1806  BP: (!) 108/55 120/66 (!) 127/58 (!) 109/49  Pulse: 79 77 77 77  Resp: 16 16 16 14   Temp:      TempSrc:      SpO2: 96% 97% 96% 98%  Weight:      Height:       Eyes: PERRL, lids and conjunctivae normal, sunken eyes ENMT: Mucous membranes are dry. Posterior pharynx clear of any exudate or lesions.Normal dentition.  Neck: normal, supple, no masses, no thyromegaly Respiratory: clear to auscultation bilaterally, no wheezing, no crackles. Normal respiratory effort. No accessory muscle use.  Cardiovascular: Tachycardic, no murmurs / rubs / gallops. No extremity edema. 2+ pedal pulses. No carotid bruits.  Abdomen: no tenderness, no masses palpated. No hepatosplenomegaly. Bowel sounds positive.  Musculoskeletal: no clubbing / cyanosis. No joint deformity upper and lower extremities. Good ROM, no contractures. Normal muscle tone.  Skin: no rashes, lesions, ulcers. No induration Neurologic: CN 2-12 grossly intact. Sensation intact,  DTR normal. Strength 5/5 in all 4.  Psychiatric: Normal judgment and insight. Alert and oriented x 3. Normal mood.     Labs on Admission: I have personally reviewed following labs and imaging studies  CBC: Recent Labs  Lab 02/23/19 0652  WBC 23.1*  HGB 15.5  HCT 46.7  MCV 91.4  PLT [id number]   Basic Metabolic Panel: Recent Labs  Lab 02/23/19 0652  NA 136  K 4.4  CL 101  CO2 23  GLUCOSE 137*  BUN 32*  CREATININE 1.08  CALCIUM 8.7*   GFR: Estimated Creatinine Clearance: 57.3 mL/min (by C-G formula based on SCr of 1.08 mg/dL). Liver Function Tests: No results for  input(s): AST, ALT, ALKPHOS, BILITOT, PROT, ALBUMIN in the last 168 hours. No results for input(s): LIPASE, AMYLASE in the last 168 hours. No results for input(s): AMMONIA in the last 168 hours. Coagulation Profile: No results for input(s): INR, PROTIME in the last 168 hours. Cardiac Enzymes: No results for input(s): CKTOTAL, CKMB, CKMBINDEX, TROPONINI in the last 168 hours. BNP (last 3 results) No results for input(s): PROBNP in the last 8760 hours. HbA1C: No results for input(s): HGBA1C in the last 72 hours. CBG: Recent Labs  Lab 02/23/19 1029  GLUCAP 105*   Lipid Profile: No results for input(s): CHOL, HDL, LDLCALC, TRIG, CHOLHDL, LDLDIRECT in the last 72 hours. Thyroid Function Tests: No results for input(s): TSH, T4TOTAL, FREET4, T3FREE, THYROIDAB in the last 72 hours. Anemia Panel: No results for input(s): VITAMINB12, FOLATE, FERRITIN, TIBC, IRON, RETICCTPCT in the last 72 hours. Urine analysis:    Component Value Date/Time   COLORURINE YELLOW 02/23/2019 1057   APPEARANCEUR CLEAR 02/23/2019 1057   LABSPEC 1.035 (H) 02/23/2019 1057   PHURINE 6.0 02/23/2019 1057   GLUCOSEU NEGATIVE 02/23/2019 1057   HGBUR SMALL (A) 02/23/2019 1057   BILIRUBINUR NEGATIVE 02/23/2019 1057   KETONESUR NEGATIVE 02/23/2019 1057   PROTEINUR NEGATIVE 02/23/2019 1057   UROBILINOGEN 0.2 05/29/2014 2125   NITRITE NEGATIVE  02/23/2019 1057   LEUKOCYTESUR TRACE (A) 02/23/2019 1057   Sepsis Labs: @LABRCNTIP (procalcitonin:4,lacticidven:4) ) Recent Results (from the past 240 hour(s))  SARS Coronavirus 2 [facility] order, Performed in [facility] hospital lab) Nasopharyngeal Nasopharyngeal Swab     Status: None   Collection Time: 02/23/19 10:01 AM   Specimen: Nasopharyngeal Swab  Result Value Ref Range Status   SARS Coronavirus 2 NEGATIVE NEGATIVE Final    Comment: (NOTE) If result is NEGATIVE SARS-CoV-2 target nucleic acids are NOT DETECTED. The SARS-CoV-2 RNA is generally detectable in upper and lower  respiratory specimens during the acute phase of infection. The lowest  concentration of SARS-CoV-2 viral copies this assay can detect is 250  copies / mL. A negative result does not preclude SARS-CoV-2 infection  and should not be used as the sole basis for treatment or other  patient management decisions.  A negative result may occur with  improper specimen collection / handling, submission of specimen other  than nasopharyngeal swab, presence of viral mutation(s) within the  areas targeted by this assay, and inadequate number of viral copies  (<250 copies / mL). A negative result must be combined with clinical  observations, patient history, and epidemiological information. If result is POSITIVE SARS-CoV-2 target nucleic acids are DETECTED. The SARS-CoV-2 RNA is generally detectable in upper and lower  respiratory specimens dur ing the acute phase of infection.  Positive  results are indicative of active infection with SARS-CoV-2.  Clinical  correlation with patient history and other diagnostic information is  necessary to determine patient infection status.  Positive results do  not rule out bacterial infection or co-infection with other viruses. If result is PRESUMPTIVE POSTIVE SARS-CoV-2 nucleic acids MAY BE PRESENT.   A presumptive positive result was obtained on the submitted specimen  and  confirmed on repeat testing.  While 2019 novel coronavirus  (SARS-CoV-2) nucleic acids may be present in the submitted sample  additional confirmatory testing may be necessary for epidemiological  and / or clinical management purposes  to differentiate between  SARS-CoV-2 and other Sarbecovirus currently known to infect humans.  If clinically indicated additional testing with an alternate test  methodology [phone]) is advised. The SARS-CoV-2 RNA  is generally  detectable in upper and lower respiratory sp ecimens during the acute  phase of infection. The expected result is Negative. Fact Sheet for Patients:  StrictlyIdeas.no Fact Sheet for Healthcare Providers: BankingDealers.co.za This test is not yet approved or cleared by the [place] FDA and has been authorized for detection and/or diagnosis of SARS-CoV-2 by FDA under an Emergency Use Authorization (EUA).  This EUA will remain in effect (meaning this test can be used) for the duration of the COVID-19 declaration under Section 564(b)(1) of the Act, 21 U.S.C. section 360bbb-3(b)(1), unless the authorization is terminated or revoked sooner. Performed at [facility], [place] [address], [place], [place] [address]      Radiological Exams on Admission: Mr Lumbar Spine W Wo Contrast  Result Date: 02/23/2019 CLINICAL DATA:  Back pain.  Recent fall. EXAM: MRI LUMBAR SPINE WITHOUT AND WITH CONTRAST TECHNIQUE: Multiplanar and multiecho pulse sequences of the lumbar spine were obtained without and with intravenous contrast. CONTRAST:  7.5 mL Gadavist intravenous contrast. COMPARISON:  CT lumbar spine from same day. FINDINGS: Segmentation: Transitional lumbosacral anatomy again identified with fully lumbarized S1 segment, in keeping with numbering system used on prior studies. Alignment:  Physiologic. Vertebrae: Acute minimally depressed compression fractures of the L1 superior endplate and L3  inferior endplate with associated marrow edema and enhancement. Additional acute nondisplaced fracture of the lower sacrum at the S4-S5 level. No evidence of discitis. No focal bone lesion. Conus medullaris and cauda equina: Conus extends to the L1 level. Conus and cauda equina appear normal. No intradural enhancement. Paraspinal and other soft tissues: Negative. Disc levels: T12-L1:  Negative. L1-L2:  Negative. L2-L3:  Negative. L3-L4:  Mild disc bulging.  No stenosis. L4-L5: Mild disc bulging with superimposed broad-based left foraminal disc protrusion encroaching on the exiting left L4 nerve root. Mild bilateral facet arthropathy. Mild left neuroforaminal stenosis. No spinal canal or right neuroforaminal stenosis. L5-S1: Mild disc bulging, endplate spurring, and bilateral facet arthropathy. Mild to moderate right lateral recess stenosis. Mild left greater than right neuroforaminal stenosis. No spinal canal stenosis. S1-S2: Negative. IMPRESSION: 1. Acute minimally depressed compression fractures of the L1 superior endplate and L3 inferior endplate. No retropulsion. 2. Additional acute nondisplaced fracture of the lower sacrum. 3. Mild spondylosis at L4-L5 and L5-S1 as described above. Electronically Signed   By: Titus Dubin M.D.   On: 02/23/2019 15:11   Ct Abdomen Pelvis W Contrast  Result Date: 02/23/2019 CLINICAL DATA:  78 year old male with abdominal pain. EXAM: CT ABDOMEN AND PELVIS WITH CONTRAST TECHNIQUE: Multidetector CT imaging of the abdomen and pelvis was performed using the standard protocol following bolus administration of intravenous contrast. CONTRAST:  173mL OMNIPAQUE IOHEXOL 300 MG/ML  SOLN COMPARISON:  Lumbar spine CT dated 02/23/2019 and CT of the abdomen pelvis dated 10/07/2012. FINDINGS: Lower chest: Minimal bibasilar linear atelectasis/scarring. The visualized lung bases are clear. Advanced multi vessel coronary vascular calcification noted. No intra-abdominal free air or free fluid.  Hepatobiliary: The liver is unremarkable. No intrahepatic biliary ductal dilatation. Multiple small stones noted within the gallbladder. No pericholecystic fluid or evidence of acute cholecystitis by CT. Ultrasound may provide better evaluation if there is clinical concern for acute cholecystitis. Pancreas: Unremarkable. No pancreatic ductal dilatation or surrounding inflammatory changes. Spleen: There is a 1 cm calcified splenic granuloma. Several additional splenic hypodense lesions are not characterized but may represent cysts or hemangioma. Adrenals/Urinary Tract: The adrenal glands are unremarkable. There is no hydronephrosis on either side. There is symmetric enhancement  and excretion of contrast by both kidneys. Bilateral renal cysts and smaller hypodense lesions which are too small to characterize. The largest cyst measures approximately 15 mm in the upper pole the right kidney. The visualized ureters appear unremarkable. There is trabeculated appearance of the bladder wall likely related to chronic bladder outlet obstruction. Stomach/Bowel: There is no bowel obstruction or active inflammation. The appendix is normal. Vascular/Lymphatic: Advanced aortoiliac atherosclerotic disease. The IVC is unremarkable. No portal venous gas. There is no adenopathy. Reproductive: Enlarged prostate gland measuring 5 cm in transverse axial diameter. Other: None Musculoskeletal: Osteopenia with degenerative changes of the spine. Fracture of the inferior endplate of the L3 extending to the anterior cortex as seen on the lumbar spine CT of table 02/08/2019. No new fracture. IMPRESSION: 1. No acute intra-abdominal or pelvic pathology. No bowel obstruction or active inflammation. Normal appendix. 2. Cholelithiasis. 3. L3 inferior endplate compression fracture. 4. Advanced aortic Atherosclerosis (ICD10-I70.0). Electronically Signed   By: Anner Crete M.D.   On: 02/23/2019 11:01   Ct L-spine No Charge  Result Date:  02/23/2019 CLINICAL DATA:  78 year old male with back pain. EXAM: CT LUMBAR SPINE WITHOUT CONTRAST TECHNIQUE: Multidetector CT imaging of the lumbar spine was performed without intravenous contrast administration. Multiplanar CT image reconstructions were also generated. COMPARISON:  Lumbar spine CT dated 02/08/2019 and CT of the abdomen pelvis dated 02/23/2019 FINDINGS: Segmentation: 6 lumbar type vertebra. The lowermost vertebra is designated as a lumbarized S1. Alignment: No acute subluxation. Vertebrae: Fracture inferior endplate of the L3 extending into the anterior vertebral body cortex. This has not significantly changed since the prior CT. No retropulsion. No other acute fracture. The bones are osteopenic. Paraspinal and other soft tissues: Negative. Disc levels: Multilevel degenerative disc disease. There is disc desiccation and vacuum phenomena at L4-L5 and L5-S1. The neural foramina remain patent. Mild diffuse disc bulge at L3-L4, L4-5, and L5-S1. IMPRESSION: 1. No new fracture. Fracture of the inferior endplate of L3 extending into the anterior vertebral body cortex, unchanged compared to the prior CT. No retropulsion. 2. Osteopenia with multilevel degenerative disc disease and mild diffuse disc bulge at L3-L4, L4-5, and L5-S1. Electronically Signed   By: Anner Crete M.D.   On: 02/23/2019 10:54   Dg Chest Port 1 View  Result Date: 02/23/2019 CLINICAL DATA:  Weakness. EXAM: PORTABLE CHEST 1 VIEW COMPARISON:  Radiograph of July 05, 2017. FINDINGS: The heart size and mediastinal contours are within normal limits. Atherosclerosis of thoracic aorta is noted. Hypoinflation of the lungs is noted with minimal bibasilar subsegmental atelectasis. No pneumothorax or pleural effusion is noted. The visualized skeletal structures are unremarkable. IMPRESSION: Hypoinflation of the lungs with minimal bibasilar subsegmental atelectasis. Aortic Atherosclerosis (ICD10-I70.0). Electronically Signed   By: Marijo Conception M.D.   On: 02/23/2019 09:50   US Abdomen Limited Ruq  Result Date: 02/23/2019 CLINICAL DATA:  Abdominal pain.  Gallstones on CT earlier this day. EXAM: ULTRASOUND ABDOMEN LIMITED RIGHT UPPER QUADRANT COMPARISON:  Abdominal CT earlier this day. FINDINGS: Gallbladder: Physiologically distended with intraluminal gallstone. Normal wall thickness of 2 mm. No pericholecystic fluid. No sonographic Murphy sign noted by sonographer. Common bile duct: Diameter: 6 mm, normal. Liver: No focal lesion identified. Within normal limits in parenchymal echogenicity. Portal vein is patent on color Doppler imaging with normal direction of blood flow towards the liver. Other: None. IMPRESSION: Gallstones without sonographic findings of acute cholecystitis. No biliary dilatation. Electronically Signed   By: Keith Rake M.D.   On: 02/23/2019 19:27  EKG: Independently reviewed.  It shows sinus tachycardia with a rate of 106.  Incomplete right bundle branch block and left anterior fascicular block visible.  No significant ST change  Assessment/Plan Principal Problem:   Weakness generalized Active Problems:   A-fib ([facility])   B-cell lymphoma ([facility])   Diabetes mellitus ([place])   Hypertension   Lower urinary tract infectious disease   Recurrent falls   History of CVA (cerebrovascular accident)   Leukocytosis     #1 generalized weakness: Most likely secondary to acute illness.  Patient has significant UTI recently.  Still appears to have residuals UTI with symptoms.  Dehydration is noted with leukocytosis.  Also patient has acute compression fracture but is not complaining of significant pain.  We will admit the patient, treat the UTI and hydrate.  PT OT consultation and will follow recommendations on disposition.  #2 UTI: Last urine culture grew unusual bacteria-Kocuria.  This should have responded to the Rocephin.  Will resume Rocephin and repeat urine and blood cultures.  #3 hypertension: Resume and  continue with home regimen.  #4 recurrent falls: This may explain the compression fracture.  Again will need physical therapy evaluation  #5 diabetes: Sliding scale insulin with supportive care  #6 leukocytosis: Secondary to the UTI.  Continue monitor  #7 A. fib: Now in sinus with tachycardia.  Monitor closely.   DVT prophylaxis: Lovenox Code Status: Full code Family Communication: No family at bedside Disposition Plan: To be determined Consults called: None Admission status: Inpatient  Severity of Illness: The appropriate patient status for this patient is INPATIENT. Inpatient status is judged to be reasonable and necessary in order to provide the required intensity of service to ensure the patient's safety. The patient's presenting symptoms, physical exam findings, and initial radiographic and laboratory data in the context of their chronic comorbidities is felt to place them at high risk for further clinical deterioration. Furthermore, it is not anticipated that the patient will be medically stable for discharge from the hospital within 2 midnights of admission. The following factors support the patient status of inpatient.   " The patient's presenting symptoms include generalized weakness. " The worrisome physical exam findings include evidence of dehydration and weakness. " The initial radiographic and laboratory data are worrisome because of compression fracture of L1. " The chronic co-morbidities include diabetes with UTIs.   * I certify that at the point of admission it is my clinical judgment that the patient will require inpatient hospital care spanning beyond 2 midnights from the point of admission due to high intensity of service, high risk for further deterioration and high frequency of surveillance required.Barbette Merino MD [facility] Pager [phone]  If 7PM-7AM, please contact night-coverage [email or web address] Password [email or web address]  02/23/2019, 8:08 PM

## 2019-02-23 NOTE — Progress Notes (Addendum)
Pt. CBG  Was 60 @ 2300, dinner and  2 apple juice given, asymptomatic, will notify attending physician and recheck  It in 15 minutes  Thanks!. Rechecked blood sugar and it was  120 @ 2350. Will continue to monitor

## 2019-02-23 NOTE — ED Provider Notes (Signed)
[facility] DEPARTMENT Provider Note   CSN: [id number] Arrival date & time: 02/23/19  9417     History   Chief Complaint Chief Complaint  Patient presents with   Weakness    HPI [name] is a 78 y.o. male hx of afib, DM, here with weakness, leg pain.  Patient was recently admitted to the hospital and discharged about a week ago for urinary tract infection and a fall.  At that time, he was also diagnosed with L3 lumbar fracture and was given a TLSO brace.  He states that he has been taking Bactrim as prescribed and just finished this several days ago.  He states that he has been progressively weaker over the last several days .  He denies any new falls or injuries and has been wearing his brace .  Has some lower back pain that is persistent.  Denies any weakness or numbness. He states that he urinates in a adult diaper and his urine does have increased odor and he is urinating more frequently.      The history is provided by the patient.    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; xrt comp 03/2010   SIRS (systemic inflammatory response syndrome) ([place]) 09/2015   Stroke ([place])    Weakness     Patient Active Problem List   Diagnosis Date Noted   Lumbar compression fracture ([place]) 02/08/2019   Closed compression fracture of L3 lumbar vertebra, initial encounter ([place]) 02/08/2019   Laryngitis    Acute non-recurrent sinusitis    Sore throat    Cough    Overflow incontinence of urine    Urinary retention    Acute lower UTI    Urinary urgency    Hypoalbuminemia due to protein-calorie malnutrition ([place])    Type 2 diabetes mellitus with peripheral neuropathy (HCC)    Benign essential HTN    H/O urethral  stricture    Acute blood loss anemia    Poorly controlled type 2 diabetes mellitus with peripheral neuropathy ([place])    Debility 06/28/2017   Personal history of urethral stricture 06/23/2017   Rhabdomyolysis 06/23/2017   Fever 09/23/2015   SIRS (systemic inflammatory response syndrome) ([place]) 09/23/2015   Near syncope 09/23/2015   PAF (paroxysmal atrial fibrillation) ([place]) 09/23/2015   Diabetes mellitus type 2, uncontrolled ([place]) 09/23/2015   Stenosis of artery (HCC)    Vertebrobasilar artery syndrome    Orthostatic hypotension    Insulin dependent diabetes mellitus ([place])    Falls frequently    Weakness 08/15/2015   Cellulitis of right upper arm 06/02/2014   Sepsis secondary to UTI ([place])    Encephalopathy 05/29/2014   Sepsis ([place]) 05/29/2014   UTI (lower urinary tract infection) 09/16/2013   Recurrent falls 09/16/2013   History of CVA (cerebrovascular accident) 09/16/2013   Leukocytosis 09/16/2013   Thrombocytopenia ([place]) 09/16/2013   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 Authorizing Provider  amLODipine (NORVASC) 10 MG tablet Take 1 tablet (10 mg total) by mouth daily. Patient taking differently: Take 5 mg by mouth 2 (two) times daily.  07/09/17   Angiulli, Lavon Paganini, PA-C  aspirin EC 81 MG EC tablet Take 1 tablet (81 mg total) by mouth daily. Patient taking differently: Take 81 mg by mouth 3 (three) times a week. Monday, Wednesday and Friday 08/19/15   Florencia Reasons, MD  Insulin Degludec (TRESIBA) 100 UNIT/ML SOLN Inject 18 Units into the skin daily.    [provider]  lisinopril  (PRINIVIL,ZESTRIL) 2.5 MG tablet Take 3 tablets (7.5 mg total) by mouth daily. Patient taking differently: Take 2.5 mg by mouth 2 (two) times daily.  07/09/17   Angiulli, Lavon Paganini, PA-C  metoprolol tartrate (LOPRESSOR) 25 MG tablet Take 1 tablet (25 mg total) by mouth 2 (two) times daily. Patient taking differently: Take 12.5 mg by mouth 2 (two) times daily.  07/09/17   Angiulli, Lavon Paganini, PA-C  simvastatin (ZOCOR) 20 MG tablet Take 1 tablet (20 mg total) by mouth at bedtime. 07/09/17   Angiulli, Lavon Paganini, PA-C    Family History Family History  Problem Relation Age of Onset   Hypertension Mother    Hypertension Father     Social History Social History   Tobacco Use   Smoking status: Former Smoker    Packs/day: 2.00    Years: 35.00    Pack years: 70.00    Quit date: 06/03/1991    Years since quitting: 27.7   Smokeless tobacco: Former Systems developer    Quit date: 06/26/1990  Substance Use Topics   Alcohol use: No   Drug use: No     Allergies   Prevnar [pneumococcal 13-val conj vacc], Actos [pioglitazone hydrochloride], Hydrochlorothiazide, and Penicillins   Review of Systems Review of Systems  Genitourinary: Positive for flank pain.  Neurological: Positive for weakness.  All other systems reviewed and are negative.    Physical Exam Updated Vital Signs BP 126/61    Pulse 72    Temp 98.9 F (37.2 C) (Oral)    Resp 18    Ht _0  (1.753 m)    Wt 78 kg    SpO2 97%    BMI 25.40 kg/m   Physical Exam Vitals signs and nursing note reviewed.  HENT:     Nose: Nose normal.     Mouth/Throat:     Mouth: Mucous membranes are moist.  Eyes:     Extraocular Movements: Extraocular movements intact.     Pupils: Pupils are equal, round, and reactive to light.  Neck:     Musculoskeletal: Normal range of motion.  Cardiovascular:     Rate and Rhythm: Normal rate.     Pulses: Normal pulses.  Pulmonary:     Effort: Pulmonary effort is normal.     Breath sounds: Normal breath sounds.   Abdominal:     General: Abdomen is flat.     Palpations: Abdomen is soft.  Musculoskeletal: Normal range of motion.     Comments: Mild diffuse lower lumbar tenderness   Skin:    General: Skin is warm.     Capillary Refill: Capillary refill takes less than 2 seconds.  Neurological:     General: No focal deficit present.  Mental Status: He is alert and oriented to person, place, and time.     Comments: Strength 4/5 bilateral legs, nl reflexes. + straight leg raise R leg   Psychiatric:        Mood and Affect: Mood normal.        Behavior: Behavior normal.      ED Treatments / Results  Labs (all labs ordered are listed, but only abnormal results are displayed) Labs Reviewed  BASIC METABOLIC PANEL - Abnormal; Notable for the following components:      Result Value   Glucose, Bld 137 (*)    BUN 32 (*)    Calcium 8.7 (*)    All other components within normal limits  CBC - Abnormal; Notable for the following components:   WBC 23.1 (*)    All other components within normal limits  URINALYSIS, ROUTINE W REFLEX MICROSCOPIC - Abnormal; Notable for the following components:   Specific Gravity, Urine 1.035 (*)    Hgb urine dipstick SMALL (*)    Leukocytes,Ua TRACE (*)    Bacteria, UA RARE (*)    All other components within normal limits  CBG MONITORING, ED - Abnormal; Notable for the following components:   Glucose-Capillary 105 (*)    All other components within normal limits  SARS CORONAVIRUS 2 (HOSPITAL ORDER, [facility])  CULTURE, BLOOD (ROUTINE X 2)  CULTURE, BLOOD (ROUTINE X 2)  URINE CULTURE  LACTIC ACID, PLASMA  SEDIMENTATION RATE  C-REACTIVE PROTEIN    EKG EKG Interpretation  Date/Time:  Thursday February 23 2019 06:59:33 EDT Ventricular Rate:  106 PR Interval:  174 QRS Duration: 118 QT Interval:  352 QTC Calculation: 467 R Axis:   -73 Text Interpretation:  Sinus tachycardia Incomplete right bundle branch block Left anterior  fascicular block Septal infarct , age undetermined Abnormal ECG No significant change since last tracing Confirmed by Wandra Arthurs ([id number]) on 02/23/2019 9:19:25 AM   Radiology Ct Abdomen Pelvis W Contrast  Result Date: 02/23/2019 CLINICAL DATA:  78 year old male with abdominal pain. EXAM: CT ABDOMEN AND PELVIS WITH CONTRAST TECHNIQUE: Multidetector CT imaging of the abdomen and pelvis was performed using the standard protocol following bolus administration of intravenous contrast. CONTRAST:  197m OMNIPAQUE IOHEXOL 300 MG/ML  SOLN COMPARISON:  Lumbar spine CT dated 02/23/2019 and CT of the abdomen pelvis dated 10/07/2012. FINDINGS: Lower chest: Minimal bibasilar linear atelectasis/scarring. The visualized lung bases are clear. Advanced multi vessel coronary vascular calcification noted. No intra-abdominal free air or free fluid. Hepatobiliary: The liver is unremarkable. No intrahepatic biliary ductal dilatation. Multiple small stones noted within the gallbladder. No pericholecystic fluid or evidence of acute cholecystitis by CT. Ultrasound may provide better evaluation if there is clinical concern for acute cholecystitis. Pancreas: Unremarkable. No pancreatic ductal dilatation or surrounding inflammatory changes. Spleen: There is a 1 cm calcified splenic granuloma. Several additional splenic hypodense lesions are not characterized but may represent cysts or hemangioma. Adrenals/Urinary Tract: The adrenal glands are unremarkable. There is no hydronephrosis on either side. There is symmetric enhancement and excretion of contrast by both kidneys. Bilateral renal cysts and smaller hypodense lesions which are too small to characterize. The largest cyst measures approximately 15 mm in the upper pole the right kidney. The visualized ureters appear unremarkable. There is trabeculated appearance of the bladder wall likely related to chronic bladder outlet obstruction. Stomach/Bowel: There is no bowel obstruction or active  inflammation. The appendix is normal. Vascular/Lymphatic: Advanced aortoiliac atherosclerotic disease. The IVC is  unremarkable. No portal venous gas. There is no adenopathy. Reproductive: Enlarged prostate gland measuring 5 cm in transverse axial diameter. Other: None Musculoskeletal: Osteopenia with degenerative changes of the spine. Fracture of the inferior endplate of the L3 extending to the anterior cortex as seen on the lumbar spine CT of table 02/08/2019. No new fracture. IMPRESSION: 1. No acute intra-abdominal or pelvic pathology. No bowel obstruction or active inflammation. Normal appendix. 2. Cholelithiasis. 3. L3 inferior endplate compression fracture. 4. Advanced aortic Atherosclerosis (ICD10-I70.0). Electronically Signed   By: Anner Crete M.D.   On: 02/23/2019 11:01   Ct L-spine No Charge  Result Date: 02/23/2019 CLINICAL DATA:  78 year old male with back pain. EXAM: CT LUMBAR SPINE WITHOUT CONTRAST TECHNIQUE: Multidetector CT imaging of the lumbar spine was performed without intravenous contrast administration. Multiplanar CT image reconstructions were also generated. COMPARISON:  Lumbar spine CT dated 02/08/2019 and CT of the abdomen pelvis dated 02/23/2019 FINDINGS: Segmentation: 6 lumbar type vertebra. The lowermost vertebra is designated as a lumbarized S1. Alignment: No acute subluxation. Vertebrae: Fracture inferior endplate of the L3 extending into the anterior vertebral body cortex. This has not significantly changed since the prior CT. No retropulsion. No other acute fracture. The bones are osteopenic. Paraspinal and other soft tissues: Negative. Disc levels: Multilevel degenerative disc disease. There is disc desiccation and vacuum phenomena at L4-L5 and L5-S1. The neural foramina remain patent. Mild diffuse disc bulge at L3-L4, L4-5, and L5-S1. IMPRESSION: 1. No new fracture. Fracture of the inferior endplate of L3 extending into the anterior vertebral body cortex, unchanged compared  to the prior CT. No retropulsion. 2. Osteopenia with multilevel degenerative disc disease and mild diffuse disc bulge at L3-L4, L4-5, and L5-S1. Electronically Signed   By: Anner Crete M.D.   On: 02/23/2019 10:54   Dg Chest Port 1 View  Result Date: 02/23/2019 CLINICAL DATA:  Weakness. EXAM: PORTABLE CHEST 1 VIEW COMPARISON:  Radiograph of July 05, 2017. FINDINGS: The heart size and mediastinal contours are within normal limits. Atherosclerosis of thoracic aorta is noted. Hypoinflation of the lungs is noted with minimal bibasilar subsegmental atelectasis. No pneumothorax or pleural effusion is noted. The visualized skeletal structures are unremarkable. IMPRESSION: Hypoinflation of the lungs with minimal bibasilar subsegmental atelectasis. Aortic Atherosclerosis (ICD10-I70.0). Electronically Signed   By: Marijo Conception M.D.   On: 02/23/2019 09:50    Procedures Procedures (including critical care time)  Medications Ordered in ED Medications  sodium chloride flush (NS) 0.9 % injection 3 mL (3 mLs Intravenous Given 02/23/19 0957)  sodium chloride 0.9 % bolus 1,000 mL (0 mLs Intravenous Stopped 02/23/19 1056)  cefTRIAXone (ROCEPHIN) 1 g in sodium chloride 0.9 % 100 mL IVPB (0 g Intravenous Stopped 02/23/19 1026)  fentaNYL (SUBLIMAZE) injection 50 mcg (50 mcg Intravenous Given 02/23/19 0955)  iohexol (OMNIPAQUE) 300 MG/ML solution 100 mL (100 mLs Intravenous Contrast Given 02/23/19 1040)     Initial Impression / Assessment and Plan / ED Course  I have reviewed the triage vital signs and the nursing notes.  Pertinent labs & imaging results that were available during my care of the patient were reviewed by me and considered in my medical decision making (see chart for details).       [name] is a 78 y.o. male here with leg pain, back pain, weakness, flank pain. Recent admission for UTI and finished bactrim. Also had lower lumbar fracture and has TLSO brace. No new falls or injury but has  positive straight leg raise  on the right and decreased strength bilaterally. No meningeal signs of suggest meningitis. Consider sepsis from pyelo vs UTI vs pneumonia.  Will get labs, UA, lactate, cultures, CT ab/pel, CXR.   1:23 PM WBC is 23. UA normal. Given that patient had a fall 2 weeks ago and has unexplained leukocytosis and worsening bilateral leg weakness, will get MRI lumbar spine to r/o epidural abscess. Given rocephin empirically since he was recently admitted for UTI  3:16 PM MRI pending. ESR and CRP elevated. Signed out to Dr. Ashok Cordia. Anticipate admission regardless of MRI results.    Final Clinical Impressions(s) / ED Diagnoses   Final diagnoses:  Back pain    ED Discharge Orders    None       Drenda Freeze, MD 02/23/19 [phone]

## 2019-02-23 NOTE — ED Notes (Signed)
Patient transported to MRI 

## 2019-02-23 NOTE — ED Triage Notes (Signed)
Patient with increase generalized weakness in the last week.  He has been diagnosed with recent UTI. Patient having trouble with mobility.  No chest pain, no shortness of breath, CAOx4.

## 2019-02-23 NOTE — ED Notes (Signed)
MS     Thea Silversmith pts daughter call @ [phone] with updates, she had to head home

## 2019-02-24 DIAGNOSIS — I10 Essential (primary) hypertension: Secondary | ICD-10-CM

## 2019-02-24 DIAGNOSIS — I480 Paroxysmal atrial fibrillation: Secondary | ICD-10-CM

## 2019-02-24 DIAGNOSIS — E1159 Type 2 diabetes mellitus with other circulatory complications: Secondary | ICD-10-CM

## 2019-02-24 DIAGNOSIS — Z794 Long term (current) use of insulin: Secondary | ICD-10-CM

## 2019-02-24 DIAGNOSIS — R700 Elevated erythrocyte sedimentation rate: Secondary | ICD-10-CM

## 2019-02-24 DIAGNOSIS — Z8673 Personal history of transient ischemic attack (TIA), and cerebral infarction without residual deficits: Secondary | ICD-10-CM

## 2019-02-24 LAB — COMPREHENSIVE METABOLIC PANEL
ALT: 19 U/L (ref 0–44)
AST: 26 U/L (ref 15–41)
Albumin: 2.3 g/dL — ABNORMAL LOW (ref 3.5–5.0)
Alkaline Phosphatase: 79 U/L (ref 38–126)
Anion gap: 10 (ref 5–15)
BUN: 29 mg/dL — ABNORMAL HIGH (ref 8–23)
CO2: 22 mmol/L (ref 22–32)
Calcium: 8.2 mg/dL — ABNORMAL LOW (ref 8.9–10.3)
Chloride: 105 mmol/L (ref 98–111)
Creatinine, Ser: 0.77 mg/dL (ref 0.61–1.24)
GFR calc Af Amer: >60 mL/min (ref >60)
GFR calc non Af Amer: >60 mL/min (ref >60)
Glucose, Bld: 135 mg/dL — ABNORMAL HIGH (ref 70–99)
Potassium: 3.8 mmol/L (ref 3.5–5.1)
Sodium: 137 mmol/L (ref 135–145)
Total Bilirubin: 0.7 mg/dL (ref 0.3–1.2)
Total Protein: 5.8 g/dL — ABNORMAL LOW (ref 6.5–8.1)

## 2019-02-24 LAB — CBC
HCT: 42.3 % (ref 39.0–52.0)
Hemoglobin: 14.1 g/dL (ref 13.0–17.0)
MCH: 30.1 pg (ref 26.0–34.0)
MCHC: 33.3 g/dL (ref 30.0–36.0)
MCV: 90.2 fL (ref 80.0–100.0)
Platelets: 189 10*3/uL (ref 150–400)
RBC: 4.69 MIL/uL (ref 4.22–5.81)
RDW: 13.2 % (ref 11.5–15.5)
WBC: 14 10*3/uL — ABNORMAL HIGH (ref 4.0–10.5)
nRBC: 0 % (ref 0.0–0.2)

## 2019-02-24 LAB — TSH: TSH: 1.638 u[IU]/mL (ref 0.350–4.500)

## 2019-02-24 LAB — GLUCOSE, CAPILLARY
Glucose-Capillary: 115 mg/dL — ABNORMAL HIGH (ref 70–99)
Glucose-Capillary: 102 mg/dL — ABNORMAL HIGH (ref 70–99)
Glucose-Capillary: 116 mg/dL — ABNORMAL HIGH (ref 70–99)
Glucose-Capillary: 159 mg/dL — ABNORMAL HIGH (ref 70–99)

## 2019-02-24 MED ORDER — CLOPIDOGREL BISULFATE 75 MG PO TABS
75.0000 mg | ORAL_TABLET | Freq: Every day | ORAL | Status: DC
  Administered 2019-02-24 – 2019-03-03 (×8): 75 mg via ORAL
  Filled 2019-02-24 (×8): qty 1

## 2019-02-24 MED ORDER — VANCOMYCIN HCL IN DEXTROSE 1-5 GM/200ML-% IV SOLN
1000.0000 mg | Freq: Two times a day (BID) | INTRAVENOUS | Status: DC
  Administered 2019-02-24 – 2019-03-03 (×14): 1000 mg via INTRAVENOUS
  Filled 2019-02-24 (×17): qty 200

## 2019-02-24 MED ORDER — LISINOPRIL 5 MG PO TABS
2.5000 mg | ORAL_TABLET | Freq: Two times a day (BID) | ORAL | Status: DC
  Administered 2019-02-24 (×2): 2.5 mg via ORAL
  Filled 2019-02-24 (×2): qty 1

## 2019-02-24 MED ORDER — ASPIRIN EC 81 MG PO TBEC
81.0000 mg | DELAYED_RELEASE_TABLET | ORAL | Status: DC
  Filled 2019-02-24 (×2): qty 1

## 2019-02-24 MED ORDER — SIMVASTATIN 20 MG PO TABS
20.0000 mg | ORAL_TABLET | Freq: Every day | ORAL | Status: DC
  Administered 2019-02-24 – 2019-03-02 (×7): 20 mg via ORAL
  Filled 2019-02-24 (×7): qty 1

## 2019-02-24 MED ORDER — HYDROCODONE-ACETAMINOPHEN 5-325 MG PO TABS
1.0000 | ORAL_TABLET | Freq: Four times a day (QID) | ORAL | Status: DC | PRN
  Administered 2019-02-24 – 2019-03-03 (×12): 1 via ORAL
  Filled 2019-02-24 (×13): qty 1

## 2019-02-24 MED ORDER — AMLODIPINE BESYLATE 10 MG PO TABS
10.0000 mg | ORAL_TABLET | Freq: Every day | ORAL | Status: DC
  Administered 2019-02-24: 09:00:00 10 mg via ORAL
  Filled 2019-02-24: qty 1

## 2019-02-24 MED ORDER — INSULIN GLARGINE 100 UNIT/ML ~~LOC~~ SOLN
18.0000 [IU] | Freq: Every day | SUBCUTANEOUS | Status: DC
  Administered 2019-02-24 – 2019-03-03 (×8): 18 [IU] via SUBCUTANEOUS
  Filled 2019-02-24 (×8): qty 0.18

## 2019-02-24 MED ORDER — METOPROLOL TARTRATE 25 MG PO TABS
12.5000 mg | ORAL_TABLET | Freq: Two times a day (BID) | ORAL | Status: DC
  Administered 2019-02-24 (×2): 12.5 mg via ORAL
  Filled 2019-02-24 (×2): qty 1

## 2019-02-24 NOTE — Plan of Care (Signed)
  Problem: Pain Managment: Goal: General experience of comfort will improve Outcome: Progressing   

## 2019-02-24 NOTE — Plan of Care (Signed)
  Problem: Education: Goal: Knowledge of General Education information will improve Description Including pain rating scale, medication(s)/side effects and non-pharmacologic comfort measures Outcome: Progressing   Problem: Health Behavior/Discharge Planning: Goal: Ability to manage health-related needs will improve Outcome: Progressing   

## 2019-02-24 NOTE — Evaluation (Signed)
Physical Therapy Evaluation Patient Details Name: [name] MRN: [id number] DOB: 08/25/1940 Today's Date: 02/24/2019   History of Present Illness  Pt is a 78 y.o. M who presents from home with generalized weakness and recent UTI. Pt with significant PMH of diabetes mellitus type 2, hypertension, lymphoma in remission, A. fib not on antiocoagulation, history of stroke with right sided weakness who presents after a fall, CT showing L3 compression fracture.  Clinical Impression  Pt presents with an overall decrease in functional mobility secondary to above. PTA, pt was living with daughter and required assist as needed secondary to weakness, using SPC and QC as needed, pt also has RW. Educ on precautions, positioning, therex, and importance of mobility. Today, pt able to perform all mobility with min assist including sit<>stand, gait up to 10 ft and 4 steps with B rails, pt continues to be limited by LE weakness and low back pain. Pt would benefit from continued acute PT services to maximize functional mobility and independence prior to d/c home with home health services.     Follow Up Recommendations Home health PT;Supervision/Assistance - 24 hour    Equipment Recommendations  None recommended by PT    Recommendations for Other Services OT consult     Precautions / Restrictions Precautions Precautions: Fall;Back Restrictions Weight Bearing Restrictions: No      Mobility  Bed Mobility Overal bed mobility: Needs Assistance Bed Mobility: Rolling;Sidelying to Sit Rolling: Supervision Sidelying to sit: Min guard       General bed mobility comments: HOB elevated, heavy use of bed rail  Transfers Overall transfer level: Needs assistance Equipment used: Rolling walker (2 wheeled) Transfers: Sit to/from Stand Sit to Stand: Min assist         General transfer comment: min assist from sit<>stand from recline or bed, performed x 3 sit<>stands throughout session with cues for  hand placement  Ambulation/Gait Ambulation/Gait assistance: Min assist Gait Distance (Feet): 10 Feet Assistive device: Rolling walker (2 wheeled) Gait Pattern/deviations: Step-through pattern;Decreased stride length;Trunk flexed Gait velocity: decreased Gait velocity interpretation: <1.31 ft/sec, indicative of household ambulator General Gait Details: Slow pace, increased trunk/hip flexion  Stairs Stairs: Yes Stairs assistance: Min assist Stair Management: Two rails;Step to pattern Number of Stairs: 4 General stair comments: step to pattern using B rails with min assist for safety/balance      Balance Overall balance assessment: Needs assistance Sitting-balance support: Feet supported Sitting balance-Leahy Scale: Fair     Standing balance support: During functional activity;Bilateral upper extremity supported Standing balance-Leahy Scale: Poor Standing balance comment: pt maintains static standing balance without UE support with min guard assist. pt requires UE support on RW for dynamic balance activities this session, min assist                             Pertinent Vitals/Pain Pain Assessment: Faces Faces Pain Scale: Hurts little more Pain Location: back Pain Descriptors / Indicators: Guarding;Grimacing Pain Intervention(s): Limited activity within patient's tolerance;Monitored during session    Home Living Family/patient expects to be discharged to:: Private residence Living Arrangements: Children(daughter)   Type of Home: House Home Access: Stairs to enter Entrance Stairs-Rails: Right Entrance Stairs-Number of Steps: 4 Home Layout: One level Home Equipment: Cane - single point;Cane - quad;Walker - 2 wheels;Wheelchair - manual Additional Comments: Plans to d/c home with daughter, typically lives alone    Prior Function Level of Independence: Needs assistance   Gait / Transfers Assistance Needed:  uses SPC inside, quad cane outside            Hand Dominance        Extremity/Trunk Assessment   Upper Extremity Assessment Upper Extremity Assessment: Overall WFL for tasks assessed    Lower Extremity Assessment Lower Extremity Assessment: Generalized weakness    Cervical / Trunk Assessment Cervical / Trunk Assessment: Kyphotic  Communication   Communication: No difficulties  Cognition Arousal/Alertness: Awake/alert Behavior During Therapy: WFL for tasks assessed/performed Overall Cognitive Status: Within Functional Limits for tasks assessed                                 General Comments: WFL for basic mobility, will further assess      General Comments      Exercises General Exercises - Lower Extremity Ankle Circles/Pumps: Strengthening;Both;10 reps Quad Sets: Strengthening;Both;10 reps   Assessment/Plan    PT Assessment Patient needs continued PT services  PT Problem List Decreased strength;Decreased activity tolerance;Decreased balance;Decreased mobility;Decreased coordination;Decreased safety awareness       PT Treatment Interventions DME instruction;Gait training;Stair training;Functional mobility training;Therapeutic exercise;Therapeutic activities;Balance training;Patient/family education    PT Goals (Current goals can be found in the Care Plan section)  Acute Rehab PT Goals Patient Stated Goal: "go home with my daughter." PT Goal Formulation: With patient Time For Goal Achievement: 03/10/19 Potential to Achieve Goals: Good    Frequency Min 3X/week   Barriers to discharge        Co-evaluation               AM-PAC PT "6 Clicks" Mobility  Outcome Measure Help needed turning from your back to your side while in a flat bed without using bedrails?: None Help needed moving from lying on your back to sitting on the side of a flat bed without using bedrails?: A Little Help needed moving to and from a bed to a chair (including a wheelchair)?: A Little Help needed standing  up from a chair using your arms (e.g., wheelchair or bedside chair)?: A Little Help needed to walk in hospital room?: A Little Help needed climbing 3-5 steps with a railing? : A Little 6 Click Score: 19    End of Session Equipment Utilized During Treatment: Gait belt Activity Tolerance: Patient tolerated treatment well Patient left: in chair;with call bell/phone within reach;with chair alarm set Nurse Communication: Mobility status PT Visit Diagnosis: Unsteadiness on feet (R26.81);History of falling (Z91.81);Difficulty in walking, not elsewhere classified (R26.2)    Time: [id number] PT Time Calculation (min) (ACUTE ONLY): 28 min   Charges:   PT Evaluation $PT Eval Moderate Complexity: 1 Mod PT Treatments $Therapeutic Activity: 8-22 mins        Netta Corrigan, PT, DPT Acute Rehab Office [place] 02/24/2019, 12:50 PM

## 2019-02-24 NOTE — Progress Notes (Signed)
Pharmacy Antibiotic Note  [name] is a 78 y.o. male admitted on 02/23/2019 with generalized weakness.  Pharmacy has been consulted for vancomycin dosing for enterococcal UTI.  Medical hx includes: B-cell lymphoma, hypertension, diabetes, atrial fibrillation, previous CVA and recurrent falls. Patient had recent UTI (>100,000 colonies/mL Kocuria species), treated with ceftriaxone. Current urine cx growing >100,000 colonies/mL Enterococcus faecalis, susceptibilities pending.  WBC 23>down to 14 today; afebrile; Scr 0.77; CrCl 77.3 ml/min (renal function stable)  Plan: Vancomycin 1 gm IV Q 12 hrs (estimated vanc AUC on this regimen, using Scr 0.8, is 519; goal vanc AUC is 400-550). Monitor renal function, cx, WBC  Height: 5\' 9"  (175.3 cm) Weight: 172 lb (78 kg) IBW/kg (Calculated) : 70.7  Temp (24hrs), Avg:98.4 F (36.9 C), Min:98.1 F (36.7 C), Max:98.6 F (37 C)  Recent Labs  Lab 02/23/19 0652 02/23/19 0952 02/24/19 0410  WBC 23.1*  --  14.0*  CREATININE 1.08  --  0.77  LATICACIDVEN  --  1.9  --     Estimated Creatinine Clearance: 77.3 mL/min (by C-G formula based on SCr of 0.77 mg/dL).    Allergies  Allergen Reactions  . Prevnar [Pneumococcal 13-Val Conj Vacc] Anaphylaxis  . Actos [Pioglitazone Hydrochloride] Other (See Comments)    Afib  . Hydrochlorothiazide Other (See Comments)    Lower potassium to low  . Penicillins Rash    Tolerated cefepime, ceftriaxone  Did it involve swelling of the face/tongue/throat, SOB, or low BP? No Did it involve sudden or severe rash/hives, skin peeling, or any reaction on the inside of your mouth or nose?Yes Did you need to seek medical attention at a hospital or doctor's office. Yes When did it last happen?78 years old If all above answers are "NO", may proceed with cephalosporin use.    Antimicrobials this admission: 9/3 ceftriaxone>>   Microbiology results: 9/3 BCx X 2: NGTD 9/3 UCx: >100,000 colonies/mL Enterococcus  faecalis, susceptibilities pending 9/3 COVID: negative  Thank you for allowing pharmacy to be a part of this patient's care.  Gillermina Hu, PharmD, BCPS, [facility] Clinical Pharmacist 02/24/2019 3:23 PM

## 2019-02-24 NOTE — Progress Notes (Addendum)
PROGRESS NOTE  [name] [id number] DOB: January 18, 1941 DOA: 02/23/2019 PCP: Alroy Dust, L.Marlou Sa, MD   LOS: 1 day   Brief Narrative / Interim history: 78 y.o. male with medical history significant of B-cell lymphoma, hypertension, diabetes, atrial fibrillation and previous CVA who came to the ER with generalized weakness and unable to walk.  Patient has had progressive weakness for the last week.  He was recently diagnosed with UTI and treated with IV Rocephin.  Patient has not fully recovered since then.  He has continued to be weak and debilitated.   Subjective: Complains of significant weakness present on my evaluation without any improvement.  Also complains of back pain at the site of where he had the fracture  Assessment & Plan: Principal Problem:   Weakness generalized Active Problems:   A-fib ([facility])   B-cell lymphoma ([place])   Diabetes mellitus ([place])   Hypertension   Lower urinary tract infectious disease   Recurrent falls   History of CVA (cerebrovascular accident)   Leukocytosis   Principal Problem Recurrent UTI / generalized weakness -Patient recently hospitalized with fever, weakness and symptoms of UTI.  He was treated with Rocephin and then transitioned to Bactrim upon discharge. -Urine cultures at that time showed Kocuria species -Current cultures show enterococcus faecalis, until speciation is back will add on gram-positive coverage with Vancomycin -Significant leukocytosis on admission with white count of 24, improving today.  He remains extremely debilitated and after discharge few weeks ago he bounced back within a matter of 2 weeks.   Active Problems Recurrent falls /recent L3 compression fracture -Evaluated by Dtr. Vertell Limber with neurosurgery during his prior hospital stay on 02/09/2019, recommendations are for TLSO brace when patient is up for stability, pain management and outpatient follow-up with x-rays in 1 month. -Continue PT  Hypertension -Continue  Norvasc, lisinopril, metoprolol  Hyperlipidemia -Continue statin   Type 2 diabetes mellitus -Continue Lantus/sliding scale  History of non-Hodgkin's lymphoma status post chemotherapy and radiation  History of urethral strictures with recurrent UTIs -Per prior notes, do not have access to urology outpatient info.  On antibiotics as above  Paroxysmal A. fib -Only on aspirin, currently in sinus  History of CVA -Continue Plavix, aspirin   Scheduled Meds: . amLODipine  10 mg Oral Daily  . aspirin EC  81 mg Oral 3 times weekly  . clopidogrel  75 mg Oral Daily  . enoxaparin (LOVENOX) injection  40 mg Subcutaneous Q24H  . insulin aspart  0-5 Units Subcutaneous QHS  . insulin aspart  0-9 Units Subcutaneous TID WC  . insulin glargine  18 Units Subcutaneous Daily  . lisinopril  2.5 mg Oral BID  . metoprolol tartrate  12.5 mg Oral BID  . simvastatin  20 mg Oral QHS   Continuous Infusions: . sodium chloride 100 mL/hr at 02/23/19 2306  . cefTRIAXone (ROCEPHIN)  IV 1 g (02/23/19 2315)   PRN Meds:.HYDROcodone-acetaminophen, ondansetron **OR** ondansetron (ZOFRAN) IV  DVT prophylaxis: Lovenox Code Status: Full code Family Communication: d/w patient  Disposition Plan: TBD  Consultants:   None   Procedures:   None   Antimicrobials:  Ceftriaxone 9/3 >>  Vancomycin 9/4 >>    Objective: Vitals:   02/23/19 2215 02/23/19 2234 02/24/19 0401 02/24/19 0801  BP: (!) 141/63 (!) 150/61 (!) 152/71 (!) 116/56  Pulse: 81 84 90 94  Resp:  17 17 19   Temp:  98.1 F (36.7 C) 98.6 F (37 C) 98.6 F (37 C)  TempSrc:  Oral Oral Oral  SpO2:  96% 97% 99% 98%  Weight:      Height:       No intake or output data in the 24 hours ending 02/24/19 1459 Filed Weights   02/23/19 0957  Weight: 78 kg    Examination:  Constitutional: NAD Eyes: PERRL, lids and conjunctivae normal ENMT: Mucous membranes are moist.  Respiratory: clear to auscultation bilaterally, no wheezing, no crackles.  Normal respiratory effort.  Cardiovascular: Regular rate and rhythm, no murmurs / rubs / gallops. No LE edema.  Abdomen: no tenderness. Bowel sounds positive.  Musculoskeletal: no clubbing / cyanosis.  Skin: no rashes Neurologic: Generalized weakness present Psychiatric: Normal judgment and insight. Alert and oriented x 3. Normal mood.    Data Reviewed: I have independently reviewed following labs and imaging studies   CBC: Recent Labs  Lab 02/23/19 0652 02/24/19 0410  WBC 23.1* 14.0*  HGB 15.5 14.1  HCT 46.7 42.3  MCV 91.4 90.2  PLT 225 [id number]   Basic Metabolic Panel: Recent Labs  Lab 02/23/19 0652 02/24/19 0410  NA 136 137  K 4.4 3.8  CL 101 105  CO2 23 22  GLUCOSE 137* 135*  BUN 32* 29*  CREATININE 1.08 0.77  CALCIUM 8.7* 8.2*   GFR: Estimated Creatinine Clearance: 77.3 mL/min (by C-G formula based on SCr of 0.77 mg/dL). Liver Function Tests: Recent Labs  Lab 02/24/19 0410  AST 26  ALT 19  ALKPHOS 79  BILITOT 0.7  PROT 5.8*  ALBUMIN 2.3*   No results for input(s): LIPASE, AMYLASE in the last 168 hours. No results for input(s): AMMONIA in the last 168 hours. Coagulation Profile: No results for input(s): INR, PROTIME in the last 168 hours. Cardiac Enzymes: No results for input(s): CKTOTAL, CKMB, CKMBINDEX, TROPONINI in the last 168 hours. BNP (last 3 results) No results for input(s): PROBNP in the last 8760 hours. HbA1C: No results for input(s): HGBA1C in the last 72 hours. CBG: Recent Labs  Lab 02/23/19 1029 02/23/19 2311 02/23/19 2350 02/24/19 0639 02/24/19 1232  GLUCAP 105* 62* 120* 115* 102*   Lipid Profile: No results for input(s): CHOL, HDL, LDLCALC, TRIG, CHOLHDL, LDLDIRECT in the last 72 hours. Thyroid Function Tests: Recent Labs    02/24/19 0410  TSH 1.638   Anemia Panel: No results for input(s): VITAMINB12, FOLATE, FERRITIN, TIBC, IRON, RETICCTPCT in the last 72 hours. Urine analysis:    Component Value Date/Time   COLORURINE  YELLOW 02/23/2019 1057   APPEARANCEUR CLEAR 02/23/2019 1057   LABSPEC 1.035 (H) 02/23/2019 1057   PHURINE 6.0 02/23/2019 1057   GLUCOSEU NEGATIVE 02/23/2019 1057   HGBUR SMALL (A) 02/23/2019 1057   BILIRUBINUR NEGATIVE 02/23/2019 1057   KETONESUR NEGATIVE 02/23/2019 1057   PROTEINUR NEGATIVE 02/23/2019 1057   UROBILINOGEN 0.2 05/29/2014 2125   NITRITE NEGATIVE 02/23/2019 1057   LEUKOCYTESUR TRACE (A) 02/23/2019 1057   Sepsis Labs: Invalid input(s): PROCALCITONIN, LACTICIDVEN  Recent Results (from the past 240 hour(s))  Blood culture (routine x 2)     Status: None (Preliminary result)   Collection Time: 02/23/19  9:52 AM   Specimen: BLOOD  Result Value Ref Range Status   Specimen Description BLOOD BLOOD LEFT FOREARM  Final   Special Requests   Final    BOTTLES DRAWN AEROBIC AND ANAEROBIC Blood Culture results may not be optimal due to an inadequate volume of blood received in culture bottles   Culture   Final    NO GROWTH 1 DAY Performed at [facility], [place]  [address], [place], [place] [address]    Report Status PENDING  Incomplete  Blood culture (routine x 2)     Status: None (Preliminary result)   Collection Time: 02/23/19  9:58 AM   Specimen: BLOOD  Result Value Ref Range Status   Specimen Description BLOOD SITE NOT SPECIFIED  Final   Special Requests   Final    BOTTLES DRAWN AEROBIC AND ANAEROBIC Blood Culture results may not be optimal due to an inadequate volume of blood received in culture bottles   Culture   Final    NO GROWTH 1 DAY Performed at [facility], [place] [address] [place], [place] [address]    Report Status PENDING  Incomplete  SARS Coronavirus 2 [facility] order, Performed in [facility] hospital lab) Nasopharyngeal Nasopharyngeal Swab     Status: None   Collection Time: 02/23/19 10:01 AM   Specimen: Nasopharyngeal Swab  Result Value Ref Range Status   SARS Coronavirus 2 NEGATIVE NEGATIVE Final    Comment: (NOTE) If result is NEGATIVE  SARS-CoV-2 target nucleic acids are NOT DETECTED. The SARS-CoV-2 RNA is generally detectable in upper and lower  respiratory specimens during the acute phase of infection. The lowest  concentration of SARS-CoV-2 viral copies this assay can detect is 250  copies / mL. A negative result does not preclude SARS-CoV-2 infection  and should not be used as the sole basis for treatment or other  patient management decisions.  A negative result may occur with  improper specimen collection / handling, submission of specimen other  than nasopharyngeal swab, presence of viral mutation(s) within the  areas targeted by this assay, and inadequate number of viral copies  (<250 copies / mL). A negative result must be combined with clinical  observations, patient history, and epidemiological information. If result is POSITIVE SARS-CoV-2 target nucleic acids are DETECTED. The SARS-CoV-2 RNA is generally detectable in upper and lower  respiratory specimens dur ing the acute phase of infection.  Positive  results are indicative of active infection with SARS-CoV-2.  Clinical  correlation with patient history and other diagnostic information is  necessary to determine patient infection status.  Positive results do  not rule out bacterial infection or co-infection with other viruses. If result is PRESUMPTIVE POSTIVE SARS-CoV-2 nucleic acids MAY BE PRESENT.   A presumptive positive result was obtained on the submitted specimen  and confirmed on repeat testing.  While 2019 novel coronavirus  (SARS-CoV-2) nucleic acids may be present in the submitted sample  additional confirmatory testing may be necessary for epidemiological  and / or clinical management purposes  to differentiate between  SARS-CoV-2 and other Sarbecovirus currently known to infect humans.  If clinically indicated additional testing with an alternate test  methodology [phone]) is advised. The SARS-CoV-2 RNA is generally  detectable in upper  and lower respiratory sp ecimens during the acute  phase of infection. The expected result is Negative. Fact Sheet for Patients:  StrictlyIdeas.no Fact Sheet for Healthcare Providers: BankingDealers.co.za This test is not yet approved or cleared by the [place] FDA and has been authorized for detection and/or diagnosis of SARS-CoV-2 by FDA under an Emergency Use Authorization (EUA).  This EUA will remain in effect (meaning this test can be used) for the duration of the COVID-19 declaration under Section 564(b)(1) of the Act, 21 U.S.C. section 360bbb-3(b)(1), unless the authorization is terminated or revoked sooner. Performed at [facility], [place] [address] [place], [place] [address]   Urine culture  Status: Abnormal (Preliminary result)   Collection Time: 02/23/19 10:57 AM   Specimen: Urine, Random  Result Value Ref Range Status   Specimen Description URINE, RANDOM  Final   Special Requests NONE  Final   Culture (A)  Final    >=100,000 COLONIES/mL ENTEROCOCCUS FAECALIS SUSCEPTIBILITIES TO FOLLOW Performed at [facility], [place] [address], [place], [place] [address]    Report Status PENDING  Incomplete      Radiology Studies: Mr Lumbar Spine W Wo Contrast  Result Date: 02/23/2019 CLINICAL DATA:  Back pain.  Recent fall. EXAM: MRI LUMBAR SPINE WITHOUT AND WITH CONTRAST TECHNIQUE: Multiplanar and multiecho pulse sequences of the lumbar spine were obtained without and with intravenous contrast. CONTRAST:  7.5 mL Gadavist intravenous contrast. COMPARISON:  CT lumbar spine from same day. FINDINGS: Segmentation: Transitional lumbosacral anatomy again identified with fully lumbarized S1 segment, in keeping with numbering system used on prior studies. Alignment:  Physiologic. Vertebrae: Acute minimally depressed compression fractures of the L1 superior endplate and L3 inferior endplate with associated marrow edema and  enhancement. Additional acute nondisplaced fracture of the lower sacrum at the S4-S5 level. No evidence of discitis. No focal bone lesion. Conus medullaris and cauda equina: Conus extends to the L1 level. Conus and cauda equina appear normal. No intradural enhancement. Paraspinal and other soft tissues: Negative. Disc levels: T12-L1:  Negative. L1-L2:  Negative. L2-L3:  Negative. L3-L4:  Mild disc bulging.  No stenosis. L4-L5: Mild disc bulging with superimposed broad-based left foraminal disc protrusion encroaching on the exiting left L4 nerve root. Mild bilateral facet arthropathy. Mild left neuroforaminal stenosis. No spinal canal or right neuroforaminal stenosis. L5-S1: Mild disc bulging, endplate spurring, and bilateral facet arthropathy. Mild to moderate right lateral recess stenosis. Mild left greater than right neuroforaminal stenosis. No spinal canal stenosis. S1-S2: Negative. IMPRESSION: 1. Acute minimally depressed compression fractures of the L1 superior endplate and L3 inferior endplate. No retropulsion. 2. Additional acute nondisplaced fracture of the lower sacrum. 3. Mild spondylosis at L4-L5 and L5-S1 as described above. Electronically Signed   By: Titus Dubin M.D.   On: 02/23/2019 15:11   Ct Abdomen Pelvis W Contrast  Result Date: 02/23/2019 CLINICAL DATA:  78 year old male with abdominal pain. EXAM: CT ABDOMEN AND PELVIS WITH CONTRAST TECHNIQUE: Multidetector CT imaging of the abdomen and pelvis was performed using the standard protocol following bolus administration of intravenous contrast. CONTRAST:  176mL OMNIPAQUE IOHEXOL 300 MG/ML  SOLN COMPARISON:  Lumbar spine CT dated 02/23/2019 and CT of the abdomen pelvis dated 10/07/2012. FINDINGS: Lower chest: Minimal bibasilar linear atelectasis/scarring. The visualized lung bases are clear. Advanced multi vessel coronary vascular calcification noted. No intra-abdominal free air or free fluid. Hepatobiliary: The liver is unremarkable. No  intrahepatic biliary ductal dilatation. Multiple small stones noted within the gallbladder. No pericholecystic fluid or evidence of acute cholecystitis by CT. Ultrasound may provide better evaluation if there is clinical concern for acute cholecystitis. Pancreas: Unremarkable. No pancreatic ductal dilatation or surrounding inflammatory changes. Spleen: There is a 1 cm calcified splenic granuloma. Several additional splenic hypodense lesions are not characterized but may represent cysts or hemangioma. Adrenals/Urinary Tract: The adrenal glands are unremarkable. There is no hydronephrosis on either side. There is symmetric enhancement and excretion of contrast by both kidneys. Bilateral renal cysts and smaller hypodense lesions which are too small to characterize. The largest cyst measures approximately 15 mm in the upper pole the right kidney. The visualized ureters appear unremarkable. There is trabeculated appearance of the  bladder wall likely related to chronic bladder outlet obstruction. Stomach/Bowel: There is no bowel obstruction or active inflammation. The appendix is normal. Vascular/Lymphatic: Advanced aortoiliac atherosclerotic disease. The IVC is unremarkable. No portal venous gas. There is no adenopathy. Reproductive: Enlarged prostate gland measuring 5 cm in transverse axial diameter. Other: None Musculoskeletal: Osteopenia with degenerative changes of the spine. Fracture of the inferior endplate of the L3 extending to the anterior cortex as seen on the lumbar spine CT of table 02/08/2019. No new fracture. IMPRESSION: 1. No acute intra-abdominal or pelvic pathology. No bowel obstruction or active inflammation. Normal appendix. 2. Cholelithiasis. 3. L3 inferior endplate compression fracture. 4. Advanced aortic Atherosclerosis (ICD10-I70.0). Electronically Signed   By: Anner Crete M.D.   On: 02/23/2019 11:01   Ct L-spine No Charge  Result Date: 02/23/2019 CLINICAL DATA:  78 year old male with back  pain. EXAM: CT LUMBAR SPINE WITHOUT CONTRAST TECHNIQUE: Multidetector CT imaging of the lumbar spine was performed without intravenous contrast administration. Multiplanar CT image reconstructions were also generated. COMPARISON:  Lumbar spine CT dated 02/08/2019 and CT of the abdomen pelvis dated 02/23/2019 FINDINGS: Segmentation: 6 lumbar type vertebra. The lowermost vertebra is designated as a lumbarized S1. Alignment: No acute subluxation. Vertebrae: Fracture inferior endplate of the L3 extending into the anterior vertebral body cortex. This has not significantly changed since the prior CT. No retropulsion. No other acute fracture. The bones are osteopenic. Paraspinal and other soft tissues: Negative. Disc levels: Multilevel degenerative disc disease. There is disc desiccation and vacuum phenomena at L4-L5 and L5-S1. The neural foramina remain patent. Mild diffuse disc bulge at L3-L4, L4-5, and L5-S1. IMPRESSION: 1. No new fracture. Fracture of the inferior endplate of L3 extending into the anterior vertebral body cortex, unchanged compared to the prior CT. No retropulsion. 2. Osteopenia with multilevel degenerative disc disease and mild diffuse disc bulge at L3-L4, L4-5, and L5-S1. Electronically Signed   By: Anner Crete M.D.   On: 02/23/2019 10:54   Dg Chest Port 1 View  Result Date: 02/23/2019 CLINICAL DATA:  Weakness. EXAM: PORTABLE CHEST 1 VIEW COMPARISON:  Radiograph of July 05, 2017. FINDINGS: The heart size and mediastinal contours are within normal limits. Atherosclerosis of thoracic aorta is noted. Hypoinflation of the lungs is noted with minimal bibasilar subsegmental atelectasis. No pneumothorax or pleural effusion is noted. The visualized skeletal structures are unremarkable. IMPRESSION: Hypoinflation of the lungs with minimal bibasilar subsegmental atelectasis. Aortic Atherosclerosis (ICD10-I70.0). Electronically Signed   By: Marijo Conception M.D.   On: 02/23/2019 09:50   US Abdomen  Limited Ruq  Result Date: 02/23/2019 CLINICAL DATA:  Abdominal pain.  Gallstones on CT earlier this day. EXAM: ULTRASOUND ABDOMEN LIMITED RIGHT UPPER QUADRANT COMPARISON:  Abdominal CT earlier this day. FINDINGS: Gallbladder: Physiologically distended with intraluminal gallstone. Normal wall thickness of 2 mm. No pericholecystic fluid. No sonographic Murphy sign noted by sonographer. Common bile duct: Diameter: 6 mm, normal. Liver: No focal lesion identified. Within normal limits in parenchymal echogenicity. Portal vein is patent on color Doppler imaging with normal direction of blood flow towards the liver. Other: None. IMPRESSION: Gallstones without sonographic findings of acute cholecystitis. No biliary dilatation. Electronically Signed   By: Keith Rake M.D.   On: 02/23/2019 19:27    Marzetta Board, MD, PhD [facility]  Contact via  [email or web address]  [place] P: [phone] F: [phone]

## 2019-02-25 LAB — URINE CULTURE: Culture: >=100000 — AB

## 2019-02-25 LAB — CBC
HCT: 37.3 % — ABNORMAL LOW (ref 39.0–52.0)
Hemoglobin: 12.4 g/dL — ABNORMAL LOW (ref 13.0–17.0)
MCH: 30.2 pg (ref 26.0–34.0)
MCHC: 33.2 g/dL (ref 30.0–36.0)
MCV: 90.8 fL (ref 80.0–100.0)
Platelets: 170 10*3/uL (ref 150–400)
RBC: 4.11 MIL/uL — ABNORMAL LOW (ref 4.22–5.81)
RDW: 13.1 % (ref 11.5–15.5)
WBC: 12.5 10*3/uL — ABNORMAL HIGH (ref 4.0–10.5)
nRBC: 0 % (ref 0.0–0.2)

## 2019-02-25 LAB — BLOOD CULTURE ID PANEL (REFLEXED)

## 2019-02-25 LAB — BASIC METABOLIC PANEL
Anion gap: 9 (ref 5–15)
BUN: 21 mg/dL (ref 8–23)
CO2: 22 mmol/L (ref 22–32)
Calcium: 7.9 mg/dL — ABNORMAL LOW (ref 8.9–10.3)
Chloride: 107 mmol/L (ref 98–111)
Creatinine, Ser: 0.65 mg/dL (ref 0.61–1.24)
GFR calc Af Amer: >60 mL/min (ref >60)
GFR calc non Af Amer: >60 mL/min (ref >60)
Glucose, Bld: 84 mg/dL (ref 70–99)
Potassium: 3.7 mmol/L (ref 3.5–5.1)
Sodium: 138 mmol/L (ref 135–145)

## 2019-02-25 LAB — GLUCOSE, CAPILLARY
Glucose-Capillary: 155 mg/dL — ABNORMAL HIGH (ref 70–99)
Glucose-Capillary: 118 mg/dL — ABNORMAL HIGH (ref 70–99)
Glucose-Capillary: 163 mg/dL — ABNORMAL HIGH (ref 70–99)
Glucose-Capillary: 161 mg/dL — ABNORMAL HIGH (ref 70–99)

## 2019-02-25 MED ORDER — AMLODIPINE BESYLATE 5 MG PO TABS
5.0000 mg | ORAL_TABLET | Freq: Two times a day (BID) | ORAL | Status: DC
  Administered 2019-02-25 – 2019-03-03 (×13): 5 mg via ORAL
  Filled 2019-02-25 (×13): qty 1

## 2019-02-25 MED ORDER — LISINOPRIL 10 MG PO TABS
10.0000 mg | ORAL_TABLET | Freq: Every day | ORAL | Status: DC
  Administered 2019-02-25 – 2019-03-03 (×7): 10 mg via ORAL
  Filled 2019-02-25 (×7): qty 1

## 2019-02-25 MED ORDER — METOPROLOL TARTRATE 25 MG PO TABS
25.0000 mg | ORAL_TABLET | Freq: Two times a day (BID) | ORAL | Status: DC
  Administered 2019-02-25 – 2019-03-03 (×12): 25 mg via ORAL
  Filled 2019-02-25 (×13): qty 1

## 2019-02-25 MED ORDER — ASPIRIN EC 81 MG PO TBEC
81.0000 mg | DELAYED_RELEASE_TABLET | Freq: Every day | ORAL | Status: DC
  Administered 2019-02-25 – 2019-03-02 (×6): 81 mg via ORAL
  Filled 2019-02-25 (×6): qty 1

## 2019-02-25 NOTE — Progress Notes (Signed)
PROGRESS NOTE  [name] [id number] DOB: [birth year] DOA: 02/23/2019 PCP: Alroy Dust, L.Marlou Sa, MD   LOS: 2 days   Brief Narrative / Interim history: 78 y.o. male with medical history significant of B-cell lymphoma, hypertension, diabetes, atrial fibrillation and previous CVA who came to the ER with generalized weakness and unable to walk.  Patient has had progressive weakness for the last week.  He was recently diagnosed with UTI and treated with IV Rocephin.  Patient has not fully recovered since then.  He has continued to be weak and debilitated.   Subjective: Still feeling weak this morning, worked with physical therapy yesterday but felt wiped after his session  Assessment & Plan: Principal Problem:   Weakness generalized Active Problems:   A-fib (HCC)   B-cell lymphoma ([place])   Diabetes mellitus ([place])   Hypertension   Lower urinary tract infectious disease   Recurrent falls   History of CVA (cerebrovascular accident)   Leukocytosis   Principal Problem Recurrent UTI / generalized weakness /GPC bacteremia -Patient recently hospitalized with fever, weakness and symptoms of UTI.  He was treated with Rocephin and then transitioned to Bactrim upon discharge. -Urine cultures at that time showed Kocuria species -Current cultures show enterococcus faecalis, patient was added on vancomycin on 9/4.  Blood cultures today show GPC's 1/4 bottles, discussed with pharmacy and have to continue IV vancomycin while awaiting speciation.   Active Problems Recurrent falls /recent L3 compression fracture -Evaluated by Dtr. Vertell Limber with neurosurgery during his prior hospital stay on 02/09/2019, recommendations are for TLSO brace when patient is up for stability, pain management and outpatient follow-up with x-rays in 1 month. -Continue PT, recommended home health daughter is somewhat concerned about that.  Last time he was hospitalized she feels like nothing was arranged they had absolutely no help  until he was rehospitalized.  Hypertension -Continue Norvasc, lisinopril, metoprolol  Hyperlipidemia -Continue statin   Type 2 diabetes mellitus -Continue Lantus/sliding scale, glucose well controlled today, keep on same regimen  History of non-Hodgkin's lymphoma status post chemotherapy and radiation  History of urethral strictures with recurrent UTIs -Per prior notes, do not have access to urology outpatient info.  On antibiotics as above  Paroxysmal A. fib -Only on aspirin, currently in sinus  History of CVA -Continue Plavix, aspirin   Scheduled Meds:  amLODipine  5 mg Oral BID   aspirin EC  81 mg Oral Daily   clopidogrel  75 mg Oral Daily   enoxaparin (LOVENOX) injection  40 mg Subcutaneous Q24H   insulin aspart  0-5 Units Subcutaneous QHS   insulin aspart  0-9 Units Subcutaneous TID WC   insulin glargine  18 Units Subcutaneous Daily   lisinopril  10 mg Oral Daily   metoprolol tartrate  25 mg Oral BID   simvastatin  20 mg Oral QHS   Continuous Infusions:  sodium chloride 100 mL/hr (02/25/19 0431)   vancomycin 1,000 mg (02/25/19 0432)   PRN Meds:.HYDROcodone-acetaminophen, ondansetron **OR** ondansetron (ZOFRAN) IV  DVT prophylaxis: Lovenox Code Status: Full code Family Communication: d/w patient and call daughter and discussed with her over the phone Disposition Plan: TBD  Consultants:   None   Procedures:   None   Antimicrobials:  Ceftriaxone 9/3 >> 9/5  Vancomycin 9/4 >>    Objective: Vitals:   02/24/19 1429 02/24/19 1938 02/25/19 0341 02/25/19 0812  BP: (!) 125/48 (!) 126/50 (!) 124/56 (!) 141/54  Pulse: 76 77 63 76  Resp: 17 17 17 16   Temp:  98.3  F (36.8 C) 97.6 F (36.4 C) 98.5 F (36.9 C)  TempSrc:  Oral Oral   SpO2: 96% 99% 98% 100%  Weight:      Height:        Intake/Output Summary (Last 24 hours) at 02/25/2019 1145 Last data filed at 02/25/2019 0900 Gross per 24 hour  Intake 720 ml  Output --  Net 720 ml   Filed  Weights   02/23/19 0957  Weight: 78 kg    Examination:  Constitutional: No distress, sitting in bed Eyes: No scleral icterus ENMT: Moist mucous membranes Respiratory: Clear to auscultation bilaterally without wheezing or crackles, normal respiratory effort Cardiovascular: Regular rate and rhythm, no murmurs appreciated.  No peripheral edema Abdomen: Soft, nontender, nondistended, positive bowel sounds Musculoskeletal: no clubbing / cyanosis.  Skin: No rashes seen Neurologic: Generalized weakness, no focal deficits Psychiatric: Normal judgment and insight. Alert and oriented x 3. Normal mood.    Data Reviewed: I have independently reviewed following labs and imaging studies   CBC: Recent Labs  Lab 02/23/19 0652 02/24/19 0410 02/25/19 0434  WBC 23.1* 14.0* 12.5*  HGB 15.5 14.1 12.4*  HCT 46.7 42.3 37.3*  MCV 91.4 90.2 90.8  PLT 225 189 [id number]   Basic Metabolic Panel: Recent Labs  Lab 02/23/19 0652 02/24/19 0410 02/25/19 0434  NA 136 137 138  K 4.4 3.8 3.7  CL 101 105 107  CO2 23 22 22   GLUCOSE 137* 135* 84  BUN 32* 29* 21  CREATININE 1.08 0.77 0.65  CALCIUM 8.7* 8.2* 7.9*   GFR: Estimated Creatinine Clearance: 77.3 mL/min (by C-G formula based on SCr of 0.65 mg/dL). Liver Function Tests: Recent Labs  Lab 02/24/19 0410  AST 26  ALT 19  ALKPHOS 79  BILITOT 0.7  PROT 5.8*  ALBUMIN 2.3*   No results for input(s): LIPASE, AMYLASE in the last 168 hours. No results for input(s): AMMONIA in the last 168 hours. Coagulation Profile: No results for input(s): INR, PROTIME in the last 168 hours. Cardiac Enzymes: No results for input(s): CKTOTAL, CKMB, CKMBINDEX, TROPONINI in the last 168 hours. BNP (last 3 results) No results for input(s): PROBNP in the last 8760 hours. HbA1C: No results for input(s): HGBA1C in the last 72 hours. CBG: Recent Labs  Lab 02/24/19 0639 02/24/19 1232 02/24/19 1805 02/24/19 2122 02/25/19 0634  GLUCAP 115* 102* 116* 159* 155*    Lipid Profile: No results for input(s): CHOL, HDL, LDLCALC, TRIG, CHOLHDL, LDLDIRECT in the last 72 hours. Thyroid Function Tests: Recent Labs    02/24/19 0410  TSH 1.638   Anemia Panel: No results for input(s): VITAMINB12, FOLATE, FERRITIN, TIBC, IRON, RETICCTPCT in the last 72 hours. Urine analysis:    Component Value Date/Time   COLORURINE YELLOW 02/23/2019 1057   APPEARANCEUR CLEAR 02/23/2019 1057   LABSPEC 1.035 (H) 02/23/2019 1057   PHURINE 6.0 02/23/2019 1057   GLUCOSEU NEGATIVE 02/23/2019 1057   HGBUR SMALL (A) 02/23/2019 1057   BILIRUBINUR NEGATIVE 02/23/2019 1057   KETONESUR NEGATIVE 02/23/2019 1057   PROTEINUR NEGATIVE 02/23/2019 1057   UROBILINOGEN 0.2 05/29/2014 2125   NITRITE NEGATIVE 02/23/2019 1057   LEUKOCYTESUR TRACE (A) 02/23/2019 1057   Sepsis Labs: Invalid input(s): PROCALCITONIN, LACTICIDVEN  Recent Results (from the past 240 hour(s))  Blood culture (routine x 2)     Status: None (Preliminary result)   Collection Time: 02/23/19  9:52 AM   Specimen: BLOOD  Result Value Ref Range Status   Specimen Description BLOOD BLOOD LEFT FOREARM  Final  Special Requests   Final    BOTTLES DRAWN AEROBIC AND ANAEROBIC Blood Culture results may not be optimal due to an inadequate volume of blood received in culture bottles   Culture  Setup Time   Final    GRAM POSITIVE COCCI AEROBIC BOTTLE ONLY CRITICAL RESULT CALLED TO, READ BACK BY AND VERIFIED WITH: [place] [id number] FCP Performed at [facility] [address], [place], [place] [address]    Culture GRAM POSITIVE COCCI  Final   Report Status PENDING  Incomplete  Blood Culture ID Panel (Reflexed)     Status: None   Collection Time: 02/23/19  9:52 AM  Result Value Ref Range Status   Enterococcus species NOT DETECTED NOT DETECTED Final   Listeria monocytogenes NOT DETECTED NOT DETECTED Final   Staphylococcus species NOT DETECTED NOT DETECTED Final   Staphylococcus aureus (BCID) NOT  DETECTED NOT DETECTED Final   Streptococcus species NOT DETECTED NOT DETECTED Final   Streptococcus agalactiae NOT DETECTED NOT DETECTED Final   Streptococcus pneumoniae NOT DETECTED NOT DETECTED Final   Streptococcus pyogenes NOT DETECTED NOT DETECTED Final   Acinetobacter baumannii NOT DETECTED NOT DETECTED Final   Enterobacteriaceae species NOT DETECTED NOT DETECTED Final   Enterobacter cloacae complex NOT DETECTED NOT DETECTED Final   Escherichia coli NOT DETECTED NOT DETECTED Final   Klebsiella oxytoca NOT DETECTED NOT DETECTED Final   Klebsiella pneumoniae NOT DETECTED NOT DETECTED Final   Proteus species NOT DETECTED NOT DETECTED Final   Serratia marcescens NOT DETECTED NOT DETECTED Final   Haemophilus influenzae NOT DETECTED NOT DETECTED Final   Neisseria meningitidis NOT DETECTED NOT DETECTED Final   Pseudomonas aeruginosa NOT DETECTED NOT DETECTED Final   Candida albicans NOT DETECTED NOT DETECTED Final   Candida glabrata NOT DETECTED NOT DETECTED Final   Candida krusei NOT DETECTED NOT DETECTED Final   Candida parapsilosis NOT DETECTED NOT DETECTED Final   Candida tropicalis NOT DETECTED NOT DETECTED Final    Comment: Performed at [facility], [place] [address] [place], [place] [address]  Blood culture (routine x 2)     Status: None (Preliminary result)   Collection Time: 02/23/19  9:58 AM   Specimen: BLOOD  Result Value Ref Range Status   Specimen Description BLOOD SITE NOT SPECIFIED  Final   Special Requests   Final    BOTTLES DRAWN AEROBIC AND ANAEROBIC Blood Culture results may not be optimal due to an inadequate volume of blood received in culture bottles   Culture   Final    NO GROWTH 1 DAY Performed at [facility], [place] [address] [place], [place] [address]    Report Status PENDING  Incomplete  SARS Coronavirus 2 [facility] order, Performed in [facility] hospital lab) Nasopharyngeal Nasopharyngeal Swab     Status: None   Collection Time: 02/23/19 10:01  AM   Specimen: Nasopharyngeal Swab  Result Value Ref Range Status   SARS Coronavirus 2 NEGATIVE NEGATIVE Final    Comment: (NOTE) If result is NEGATIVE SARS-CoV-2 target nucleic acids are NOT DETECTED. The SARS-CoV-2 RNA is generally detectable in upper and lower  respiratory specimens during the acute phase of infection. The lowest  concentration of SARS-CoV-2 viral copies this assay can detect is 250  copies / mL. A negative result does not preclude SARS-CoV-2 infection  and should not be used as the sole basis for treatment or other  patient management decisions.  A negative result may occur with  improper specimen collection / handling, submission of specimen other  than nasopharyngeal swab, presence of viral mutation(s) within the  areas targeted by this assay, and inadequate number of viral copies  (<250 copies / mL). A negative result must be combined with clinical  observations, patient history, and epidemiological information. If result is POSITIVE SARS-CoV-2 target nucleic acids are DETECTED. The SARS-CoV-2 RNA is generally detectable in upper and lower  respiratory specimens dur ing the acute phase of infection.  Positive  results are indicative of active infection with SARS-CoV-2.  Clinical  correlation with patient history and other diagnostic information is  necessary to determine patient infection status.  Positive results do  not rule out bacterial infection or co-infection with other viruses. If result is PRESUMPTIVE POSTIVE SARS-CoV-2 nucleic acids MAY BE PRESENT.   A presumptive positive result was obtained on the submitted specimen  and confirmed on repeat testing.  While 2019 novel coronavirus  (SARS-CoV-2) nucleic acids may be present in the submitted sample  additional confirmatory testing may be necessary for epidemiological  and / or clinical management purposes  to differentiate between  SARS-CoV-2 and other Sarbecovirus currently known to infect humans.   If clinically indicated additional testing with an alternate test  methodology [phone]) is advised. The SARS-CoV-2 RNA is generally  detectable in upper and lower respiratory sp ecimens during the acute  phase of infection. The expected result is Negative. Fact Sheet for Patients:  StrictlyIdeas.no Fact Sheet for Healthcare Providers: BankingDealers.co.za This test is not yet approved or cleared by the [place] FDA and has been authorized for detection and/or diagnosis of SARS-CoV-2 by FDA under an Emergency Use Authorization (EUA).  This EUA will remain in effect (meaning this test can be used) for the duration of the COVID-19 declaration under Section 564(b)(1) of the Act, 21 U.S.C. section 360bbb-3(b)(1), unless the authorization is terminated or revoked sooner. Performed at [facility], [place] [address] [place], [place] [address]   Urine culture     Status: Abnormal   Collection Time: 02/23/19 10:57 AM   Specimen: Urine, Random  Result Value Ref Range Status   Specimen Description URINE, RANDOM  Final   Special Requests   Final    NONE Performed at [facility], [place] [address], [place],  [address]    Culture >=100,000 COLONIES/mL ENTEROCOCCUS FAECALIS (A)  Final   Report Status 02/25/2019 FINAL  Final   Organism ID, Bacteria ENTEROCOCCUS FAECALIS (A)  Final      Susceptibility   Enterococcus faecalis - MIC*    AMPICILLIN <=2 SENSITIVE Sensitive     LEVOFLOXACIN >=8 RESISTANT Resistant     NITROFURANTOIN 64 INTERMEDIATE Intermediate     VANCOMYCIN 1 SENSITIVE Sensitive     * >=100,000 COLONIES/mL ENTEROCOCCUS FAECALIS      Radiology Studies: Mr Lumbar Spine W Wo Contrast  Result Date: 02/23/2019 CLINICAL DATA:  Back pain.  Recent fall. EXAM: MRI LUMBAR SPINE WITHOUT AND WITH CONTRAST TECHNIQUE: Multiplanar and multiecho pulse sequences of the lumbar spine were obtained without and with intravenous  contrast. CONTRAST:  7.5 mL Gadavist intravenous contrast. COMPARISON:  CT lumbar spine from same day. FINDINGS: Segmentation: Transitional lumbosacral anatomy again identified with fully lumbarized S1 segment, in keeping with numbering system used on prior studies. Alignment:  Physiologic. Vertebrae: Acute minimally depressed compression fractures of the L1 superior endplate and L3 inferior endplate with associated marrow edema and enhancement. Additional acute nondisplaced fracture of the lower sacrum at the S4-S5  level. No evidence of discitis. No focal bone lesion. Conus medullaris and cauda equina: Conus extends to the L1 level. Conus and cauda equina appear normal. No intradural enhancement. Paraspinal and other soft tissues: Negative. Disc levels: T12-L1:  Negative. L1-L2:  Negative. L2-L3:  Negative. L3-L4:  Mild disc bulging.  No stenosis. L4-L5: Mild disc bulging with superimposed broad-based left foraminal disc protrusion encroaching on the exiting left L4 nerve root. Mild bilateral facet arthropathy. Mild left neuroforaminal stenosis. No spinal canal or right neuroforaminal stenosis. L5-S1: Mild disc bulging, endplate spurring, and bilateral facet arthropathy. Mild to moderate right lateral recess stenosis. Mild left greater than right neuroforaminal stenosis. No spinal canal stenosis. S1-S2: Negative. IMPRESSION: 1. Acute minimally depressed compression fractures of the L1 superior endplate and L3 inferior endplate. No retropulsion. 2. Additional acute nondisplaced fracture of the lower sacrum. 3. Mild spondylosis at L4-L5 and L5-S1 as described above. Electronically Signed   By: Titus Dubin M.D.   On: 02/23/2019 15:11   US Abdomen Limited Ruq  Result Date: 02/23/2019 CLINICAL DATA:  Abdominal pain.  Gallstones on CT earlier this day. EXAM: ULTRASOUND ABDOMEN LIMITED RIGHT UPPER QUADRANT COMPARISON:  Abdominal CT earlier this day. FINDINGS: Gallbladder: Physiologically distended with intraluminal  gallstone. Normal wall thickness of 2 mm. No pericholecystic fluid. No sonographic Murphy sign noted by sonographer. Common bile duct: Diameter: 6 mm, normal. Liver: No focal lesion identified. Within normal limits in parenchymal echogenicity. Portal vein is patent on color Doppler imaging with normal direction of blood flow towards the liver. Other: None. IMPRESSION: Gallstones without sonographic findings of acute cholecystitis. No biliary dilatation. Electronically Signed   By: Keith Rake M.D.   On: 02/23/2019 19:27    Marzetta Board, MD, PhD [facility]  Contact via  [email or web address]  [place] P: [phone] F: [phone]

## 2019-02-25 NOTE — Plan of Care (Signed)

## 2019-02-25 NOTE — TOC Progression Note (Signed)
Transition of Care [facility]) - Progression Note    Patient Details  Name: CHASITY BLUE MRN: [id number] Date of Birth: [birth year]  Transition of [facility]) CM/SW Contact  Claudie Leach, RN Phone Number: 02/25/2019, 2:51 PM  Clinical Narrative:    Pt to d/c home with [facility] PT, OT, NA, CSW. Patient is a readmission and no HH was arranged at last discharge.  The patient's daughter reports trying to set up [facility] through PCP for the last week with no success.  Daughter reports they were finally set up with [facility] but never heard from them.  The daughter is willing for patient to come home but also is willing for him to go to SNF if PT deems necessary.    [facility] cannot find record of patient and cannot take the referral.  [facility] is able to take this referral with start of care Tuesday or Wednesday.     The daughter states they have all necessary DME.    Barriers to Discharge: No Barriers Identified  Expected Discharge Plan and Services      HH Arranged: PT, OT, Nurse's Aide, Social Work [facility] Agency: [place] ([place]) Date [place]: 02/25/19 Time [place]: 1450 Representative spoke with at [place]: Floydene Flock

## 2019-02-25 NOTE — Progress Notes (Signed)
PHARMACY - PHYSICIAN COMMUNICATION CRITICAL VALUE ALERT - BLOOD CULTURE IDENTIFICATION (BCID)  [name] is an 78 y.o. male who presented to [facility] on 02/23/2019 with a chief complaint of generalized weakness.  Assessment: 56 YOM on Vancomycin for Enterococcal UTI. Now with 1 of 4 blood cultures showing GPC in the aerobic bottle only with BCID undetected. This could represent a contaminant or recurrent Kocuria species since the patient also grew this out on urine cultures from 8/19.   Name of physician (or Provider) ContactedRenne Crigler  Current antibiotics: Vancomycin   Changes to prescribed antibiotics recommended:  Patient is on recommended antibiotics - No changes needed. Would recommend to continue with Vancomycin while awaiting further ID on this organism given recent Kocuria in urine culture. Also treating Enterococcal UTI.   Results for orders placed or performed during the hospital encounter of 02/23/19  Blood Culture ID Panel (Reflexed) (Collected: 02/23/2019  9:52 AM)  Result Value Ref Range   Enterococcus species NOT DETECTED NOT DETECTED   Listeria monocytogenes NOT DETECTED NOT DETECTED   Staphylococcus species NOT DETECTED NOT DETECTED   Staphylococcus aureus (BCID) NOT DETECTED NOT DETECTED   Streptococcus species NOT DETECTED NOT DETECTED   Streptococcus agalactiae NOT DETECTED NOT DETECTED   Streptococcus pneumoniae NOT DETECTED NOT DETECTED   Streptococcus pyogenes NOT DETECTED NOT DETECTED   Acinetobacter baumannii NOT DETECTED NOT DETECTED   Enterobacteriaceae species NOT DETECTED NOT DETECTED   Enterobacter cloacae complex NOT DETECTED NOT DETECTED   Escherichia coli NOT DETECTED NOT DETECTED   Klebsiella oxytoca NOT DETECTED NOT DETECTED   Klebsiella pneumoniae NOT DETECTED NOT DETECTED   Proteus species NOT DETECTED NOT DETECTED   Serratia marcescens NOT DETECTED NOT DETECTED   Haemophilus influenzae NOT DETECTED NOT DETECTED   Neisseria meningitidis NOT  DETECTED NOT DETECTED   Pseudomonas aeruginosa NOT DETECTED NOT DETECTED   Candida albicans NOT DETECTED NOT DETECTED   Candida glabrata NOT DETECTED NOT DETECTED   Candida krusei NOT DETECTED NOT DETECTED   Candida parapsilosis NOT DETECTED NOT DETECTED   Candida tropicalis NOT DETECTED NOT DETECTED    Thank you for allowing pharmacy to be a part of this patient's care.  Alycia Rossetti, PharmD, BCPS Clinical Pharmacist Clinical phone for 02/25/2019: [phone] 02/25/2019 11:24 AM   **Pharmacist phone directory can now be found on [email or web address] (PW [email or web address]).  Listed under [place].

## 2019-02-26 ENCOUNTER — Other Ambulatory Visit: Payer: Self-pay

## 2019-02-26 LAB — GLUCOSE, CAPILLARY
Glucose-Capillary: 103 mg/dL — ABNORMAL HIGH (ref 70–99)
Glucose-Capillary: 114 mg/dL — ABNORMAL HIGH (ref 70–99)
Glucose-Capillary: 135 mg/dL — ABNORMAL HIGH (ref 70–99)
Glucose-Capillary: 94 mg/dL (ref 70–99)

## 2019-02-26 NOTE — Plan of Care (Signed)
  Problem: Education: Goal: Knowledge of General Education information will improve Description: Including pain rating scale, medication(s)/side effects and non-pharmacologic comfort measures 02/26/2019 1134 by Cephus Shelling, RN Outcome: Progressing 02/26/2019 1134 by Cephus Shelling, RN Outcome: Progressing   Problem: Health Behavior/Discharge Planning: Goal: Ability to manage health-related needs will improve 02/26/2019 1134 by Cephus Shelling, RN Outcome: Progressing 02/26/2019 1134 by Cephus Shelling, RN Outcome: Progressing   Problem: Activity: Goal: Risk for activity intolerance will decrease 02/26/2019 1134 by Cephus Shelling, RN Outcome: Progressing 02/26/2019 1134 by Cephus Shelling, RN Outcome: Progressing   Problem: Nutrition: Goal: Adequate nutrition will be maintained Outcome: Progressing   Problem: Elimination: Goal: Will not experience complications related to bowel motility 02/26/2019 1134 by Cephus Shelling, RN Outcome: Progressing 02/26/2019 1134 by Cephus Shelling, RN Outcome: Progressing Goal: Will not experience complications related to urinary retention Outcome: Progressing   Problem: Pain Managment: Goal: General experience of comfort will improve 02/26/2019 1134 by Cephus Shelling, RN Outcome: Progressing 02/26/2019 1134 by Cephus Shelling, RN Outcome: Progressing   Problem: Safety: Goal: Ability to remain free from injury will improve 02/26/2019 1134 by Cephus Shelling, RN Outcome: Progressing 02/26/2019 1134 by Cephus Shelling, RN Outcome: Progressing   Problem: Skin Integrity: Goal: Risk for impaired skin integrity will decrease Outcome: Progressing

## 2019-02-26 NOTE — Progress Notes (Signed)
PROGRESS NOTE  [name] [id number] DOB: 09-12-1940 DOA: 02/23/2019 PCP: Alroy Dust, L.Marlou Sa, MD   LOS: 3 days   Brief Narrative / Interim history: 78 y.o. male with medical history significant of B-cell lymphoma, hypertension, diabetes, atrial fibrillation and previous CVA who came to the ER with generalized weakness and unable to walk.  Patient has had progressive weakness for the last week.  He was recently diagnosed with UTI and treated with IV Rocephin.  Patient has not fully recovered since then.  He has continued to be weak and debilitated.   Subjective: Feeling better today, denies any fever or chills, no abdominal pain, no nausea or vomiting.  Feeling stronger  Assessment & Plan: Principal Problem:   Weakness generalized Active Problems:   A-fib (HCC)   B-cell lymphoma (HCC)   Diabetes mellitus ([place])   Hypertension   Lower urinary tract infectious disease   Recurrent falls   History of CVA (cerebrovascular accident)   Leukocytosis   Principal Problem Recurrent UTI / generalized weakness /GPC bacteremia -Patient recently hospitalized with fever, weakness and symptoms of UTI.  He was treated with Rocephin and then transitioned to Bactrim upon discharge. -Urine cultures at that time showed Kocuria species -Current cultures show enterococcus faecalis, patient was added on vancomycin on 9/4.  Blood cultures showing GPC's 1/4 bottles, discussed with pharmacy and have to continue IV vancomycin while awaiting speciation.  This is still pending today  Active Problems Recurrent falls /recent L3 compression fracture -Evaluated by Dtr. Vertell Limber with neurosurgery during his prior hospital stay on 02/09/2019, recommendations are for TLSO brace when patient is up for stability, pain management and outpatient follow-up with x-rays in 1 month. -Continue PT, recommended home health daughter is somewhat concerned about that.  Last time he was hospitalized she feels like nothing was  arranged they had absolutely no help until he was rehospitalized. -Discussed with care management, advance home care will evaluate patient at home on Tuesday if he is discharged by then  Hypertension -Continue Norvasc, lisinopril, metoprolol, blood pressure stable  Hyperlipidemia -Continue statin   Type 2 diabetes mellitus -Continue Lantus/sliding scale, glucose well controlled today, keep on same regimen  History of non-Hodgkin's lymphoma status post chemotherapy and radiation  History of urethral strictures with recurrent UTIs -Per prior notes, do not have access to urology outpatient info.  On antibiotics as above  Paroxysmal A. fib -Only on aspirin, currently in sinus  History of CVA -Continue Plavix, aspirin   Scheduled Meds:  amLODipine  5 mg Oral BID   aspirin EC  81 mg Oral Daily   clopidogrel  75 mg Oral Daily   enoxaparin (LOVENOX) injection  40 mg Subcutaneous Q24H   insulin aspart  0-5 Units Subcutaneous QHS   insulin aspart  0-9 Units Subcutaneous TID WC   insulin glargine  18 Units Subcutaneous Daily   lisinopril  10 mg Oral Daily   metoprolol tartrate  25 mg Oral BID   simvastatin  20 mg Oral QHS   Continuous Infusions:  vancomycin 1,000 mg (02/26/19 0556)   PRN Meds:.HYDROcodone-acetaminophen, ondansetron **OR** ondansetron (ZOFRAN) IV  DVT prophylaxis: Lovenox Code Status: Full code Family Communication: Discussed with daughter 9/5 Disposition Plan: TBD  Consultants:   None   Procedures:   None   Antimicrobials:  Ceftriaxone 9/3 >> 9/5  Vancomycin 9/4 >>    Objective: Vitals:   02/25/19 1543 02/25/19 2041 02/26/19 0318 02/26/19 0911  BP: (!) 142/51 (!) 116/49 (!) 154/64 (!) 137/58  Pulse: 76  69 80 74  Resp: 18 18 18 18   Temp: 98.3 F (36.8 C) 98.5 F (36.9 C) 98.6 F (37 C) 98.1 F (36.7 C)  TempSrc: Oral Oral Oral Oral  SpO2: 98% 97% 97% 98%  Weight:      Height:        Intake/Output Summary (Last 24 hours) at  02/26/2019 1214 Last data filed at 02/26/2019 0900 Gross per 24 hour  Intake 1000 ml  Output --  Net 1000 ml   Filed Weights   02/23/19 0957  Weight: 78 kg    Examination:  Constitutional: NAD, sitting in bed Eyes: No scleral icterus ENMT: Moist mucous membranes Respiratory: Clear to auscultation bilaterally, no wheezing, no crackles Cardiovascular: Regular rate and rhythm, no murmurs.  No peripheral edema Abdomen: Soft, NT, ND, positive bowel sounds Musculoskeletal: no clubbing / cyanosis.  Skin: No rash seen Neurologic: Nonfocal, equal strength Psychiatric: Normal judgment and insight. Alert and oriented x 3. Normal mood.    Data Reviewed: I have independently reviewed following labs and imaging studies   CBC: Recent Labs  Lab 02/23/19 0652 02/24/19 0410 02/25/19 0434  WBC 23.1* 14.0* 12.5*  HGB 15.5 14.1 12.4*  HCT 46.7 42.3 37.3*  MCV 91.4 90.2 90.8  PLT 225 189 [id number]   Basic Metabolic Panel: Recent Labs  Lab 02/23/19 0652 02/24/19 0410 02/25/19 0434  NA 136 137 138  K 4.4 3.8 3.7  CL 101 105 107  CO2 23 22 22   GLUCOSE 137* 135* 84  BUN 32* 29* 21  CREATININE 1.08 0.77 0.65  CALCIUM 8.7* 8.2* 7.9*   GFR: Estimated Creatinine Clearance: 77.3 mL/min (by C-G formula based on SCr of 0.65 mg/dL). Liver Function Tests: Recent Labs  Lab 02/24/19 0410  AST 26  ALT 19  ALKPHOS 79  BILITOT 0.7  PROT 5.8*  ALBUMIN 2.3*   No results for input(s): LIPASE, AMYLASE in the last 168 hours. No results for input(s): AMMONIA in the last 168 hours. Coagulation Profile: No results for input(s): INR, PROTIME in the last 168 hours. Cardiac Enzymes: No results for input(s): CKTOTAL, CKMB, CKMBINDEX, TROPONINI in the last 168 hours. BNP (last 3 results) No results for input(s): PROBNP in the last 8760 hours. HbA1C: No results for input(s): HGBA1C in the last 72 hours. CBG: Recent Labs  Lab 02/25/19 1150 02/25/19 1626 02/25/19 2104 02/26/19 0642 02/26/19 1159   GLUCAP 118* 163* 161* 103* 114*   Lipid Profile: No results for input(s): CHOL, HDL, LDLCALC, TRIG, CHOLHDL, LDLDIRECT in the last 72 hours. Thyroid Function Tests: Recent Labs    02/24/19 0410  TSH 1.638   Anemia Panel: No results for input(s): VITAMINB12, FOLATE, FERRITIN, TIBC, IRON, RETICCTPCT in the last 72 hours. Urine analysis:    Component Value Date/Time   COLORURINE YELLOW 02/23/2019 1057   APPEARANCEUR CLEAR 02/23/2019 1057   LABSPEC 1.035 (H) 02/23/2019 1057   PHURINE 6.0 02/23/2019 1057   GLUCOSEU NEGATIVE 02/23/2019 1057   HGBUR SMALL (A) 02/23/2019 1057   BILIRUBINUR NEGATIVE 02/23/2019 1057   KETONESUR NEGATIVE 02/23/2019 1057   PROTEINUR NEGATIVE 02/23/2019 1057   UROBILINOGEN 0.2 05/29/2014 2125   NITRITE NEGATIVE 02/23/2019 1057   LEUKOCYTESUR TRACE (A) 02/23/2019 1057   Sepsis Labs: Invalid input(s): PROCALCITONIN, LACTICIDVEN  Recent Results (from the past 240 hour(s))  Blood culture (routine x 2)     Status: None (Preliminary result)   Collection Time: 02/23/19  9:52 AM   Specimen: BLOOD  Result Value Ref Range Status  Specimen Description BLOOD BLOOD LEFT FOREARM  Final   Special Requests   Final    BOTTLES DRAWN AEROBIC AND ANAEROBIC Blood Culture results may not be optimal due to an inadequate volume of blood received in culture bottles   Culture  Setup Time   Final    GRAM POSITIVE COCCI AEROBIC BOTTLE ONLY CRITICAL RESULT CALLED TO, READ BACK BY AND VERIFIED WITH: [place] [id number] [place] Performed at [facility], [place] [address] [place], [place] [address]    Culture GRAM POSITIVE COCCI  Final   Report Status PENDING  Incomplete  Blood Culture ID Panel (Reflexed)     Status: None   Collection Time: 02/23/19  9:52 AM  Result Value Ref Range Status   Enterococcus species NOT DETECTED NOT DETECTED Final   Listeria monocytogenes NOT DETECTED NOT DETECTED Final   Staphylococcus species NOT DETECTED NOT DETECTED Final    Staphylococcus aureus (BCID) NOT DETECTED NOT DETECTED Final   Streptococcus species NOT DETECTED NOT DETECTED Final   Streptococcus agalactiae NOT DETECTED NOT DETECTED Final   Streptococcus pneumoniae NOT DETECTED NOT DETECTED Final   Streptococcus pyogenes NOT DETECTED NOT DETECTED Final   Acinetobacter baumannii NOT DETECTED NOT DETECTED Final   Enterobacteriaceae species NOT DETECTED NOT DETECTED Final   Enterobacter cloacae complex NOT DETECTED NOT DETECTED Final   Escherichia coli NOT DETECTED NOT DETECTED Final   Klebsiella oxytoca NOT DETECTED NOT DETECTED Final   Klebsiella pneumoniae NOT DETECTED NOT DETECTED Final   Proteus species NOT DETECTED NOT DETECTED Final   Serratia marcescens NOT DETECTED NOT DETECTED Final   Haemophilus influenzae NOT DETECTED NOT DETECTED Final   Neisseria meningitidis NOT DETECTED NOT DETECTED Final   Pseudomonas aeruginosa NOT DETECTED NOT DETECTED Final   Candida albicans NOT DETECTED NOT DETECTED Final   Candida glabrata NOT DETECTED NOT DETECTED Final   Candida krusei NOT DETECTED NOT DETECTED Final   Candida parapsilosis NOT DETECTED NOT DETECTED Final   Candida tropicalis NOT DETECTED NOT DETECTED Final    Comment: Performed at [facility], [place] [address] [place], [place] [address]  Blood culture (routine x 2)     Status: None (Preliminary result)   Collection Time: 02/23/19  9:58 AM   Specimen: BLOOD  Result Value Ref Range Status   Specimen Description BLOOD SITE NOT SPECIFIED  Final   Special Requests   Final    BOTTLES DRAWN AEROBIC AND ANAEROBIC Blood Culture results may not be optimal due to an inadequate volume of blood received in culture bottles   Culture   Final    NO GROWTH 3 DAYS Performed at [facility], [place] [address] [place], [place] [address]    Report Status PENDING  Incomplete  SARS Coronavirus 2 [facility] order, Performed in [facility] hospital lab) Nasopharyngeal Nasopharyngeal Swab     Status:  None   Collection Time: 02/23/19 10:01 AM   Specimen: Nasopharyngeal Swab  Result Value Ref Range Status   SARS Coronavirus 2 NEGATIVE NEGATIVE Final    Comment: (NOTE) If result is NEGATIVE SARS-CoV-2 target nucleic acids are NOT DETECTED. The SARS-CoV-2 RNA is generally detectable in upper and lower  respiratory specimens during the acute phase of infection. The lowest  concentration of SARS-CoV-2 viral copies this assay can detect is 250  copies / mL. A negative result does not preclude SARS-CoV-2 infection  and should not be used as the sole basis for treatment or other  patient  management decisions.  A negative result may occur with  improper specimen collection / handling, submission of specimen other  than nasopharyngeal swab, presence of viral mutation(s) within the  areas targeted by this assay, and inadequate number of viral copies  (<250 copies / mL). A negative result must be combined with clinical  observations, patient history, and epidemiological information. If result is POSITIVE SARS-CoV-2 target nucleic acids are DETECTED. The SARS-CoV-2 RNA is generally detectable in upper and lower  respiratory specimens dur ing the acute phase of infection.  Positive  results are indicative of active infection with SARS-CoV-2.  Clinical  correlation with patient history and other diagnostic information is  necessary to determine patient infection status.  Positive results do  not rule out bacterial infection or co-infection with other viruses. If result is PRESUMPTIVE POSTIVE SARS-CoV-2 nucleic acids MAY BE PRESENT.   A presumptive positive result was obtained on the submitted specimen  and confirmed on repeat testing.  While 2019 novel coronavirus  (SARS-CoV-2) nucleic acids may be present in the submitted sample  additional confirmatory testing may be necessary for epidemiological  and / or clinical management purposes  to differentiate between  SARS-CoV-2 and other  Sarbecovirus currently known to infect humans.  If clinically indicated additional testing with an alternate test  methodology [phone]) is advised. The SARS-CoV-2 RNA is generally  detectable in upper and lower respiratory sp ecimens during the acute  phase of infection. The expected result is Negative. Fact Sheet for Patients:  StrictlyIdeas.no Fact Sheet for Healthcare Providers: BankingDealers.co.za This test is not yet approved or cleared by the [place] FDA and has been authorized for detection and/or diagnosis of SARS-CoV-2 by FDA under an Emergency Use Authorization (EUA).  This EUA will remain in effect (meaning this test can be used) for the duration of the COVID-19 declaration under Section 564(b)(1) of the Act, 21 U.S.C. section 360bbb-3(b)(1), unless the authorization is terminated or revoked sooner. Performed at [facility], [place] [address] [place], [place] [address]   Urine culture     Status: Abnormal   Collection Time: 02/23/19 10:57 AM   Specimen: Urine, Random  Result Value Ref Range Status   Specimen Description URINE, RANDOM  Final   Special Requests   Final    NONE Performed at [facility], [place] [address] [place], [place] [address]    Culture >=100,000 COLONIES/mL ENTEROCOCCUS FAECALIS (A)  Final   Report Status 02/25/2019 FINAL  Final   Organism ID, Bacteria ENTEROCOCCUS FAECALIS (A)  Final      Susceptibility   Enterococcus faecalis - MIC*    AMPICILLIN <=2 SENSITIVE Sensitive     LEVOFLOXACIN >=8 RESISTANT Resistant     NITROFURANTOIN 64 INTERMEDIATE Intermediate     VANCOMYCIN 1 SENSITIVE Sensitive     * >=100,000 COLONIES/mL ENTEROCOCCUS FAECALIS      Radiology Studies: No results found.  Marzetta Board, MD, PhD [facility]  Contact via  [email or web address]  [place] P: [phone] F: [phone]

## 2019-02-27 ENCOUNTER — Encounter (HOSPITAL_COMMUNITY): Payer: Self-pay | Admitting: *Deleted

## 2019-02-27 LAB — BASIC METABOLIC PANEL
Anion gap: 9 (ref 5–15)
BUN: 16 mg/dL (ref 8–23)
CO2: 25 mmol/L (ref 22–32)
Calcium: 7.7 mg/dL — ABNORMAL LOW (ref 8.9–10.3)
Chloride: 102 mmol/L (ref 98–111)
Creatinine, Ser: 0.72 mg/dL (ref 0.61–1.24)
GFR calc Af Amer: >60 mL/min (ref >60)
GFR calc non Af Amer: >60 mL/min (ref >60)
Glucose, Bld: 76 mg/dL (ref 70–99)
Potassium: 3.4 mmol/L — ABNORMAL LOW (ref 3.5–5.1)
Sodium: 136 mmol/L (ref 135–145)

## 2019-02-27 LAB — CULTURE, BLOOD (ROUTINE X 2)

## 2019-02-27 LAB — GLUCOSE, CAPILLARY
Glucose-Capillary: 72 mg/dL (ref 70–99)
Glucose-Capillary: 104 mg/dL — ABNORMAL HIGH (ref 70–99)
Glucose-Capillary: 145 mg/dL — ABNORMAL HIGH (ref 70–99)
Glucose-Capillary: 123 mg/dL — ABNORMAL HIGH (ref 70–99)

## 2019-02-27 MED ORDER — SODIUM CHLORIDE 0.9% FLUSH: 10.0000 mL | INTRAVENOUS | Status: DC | PRN

## 2019-02-27 MED ORDER — POTASSIUM CHLORIDE CRYS ER 20 MEQ PO TBCR
40.0000 meq | EXTENDED_RELEASE_TABLET | Freq: Once | ORAL | Status: AC
  Administered 2019-02-27: 40 meq via ORAL
  Filled 2019-02-27: qty 2

## 2019-02-27 MED ORDER — SODIUM CHLORIDE 0.9% FLUSH
10.0000 mL | Freq: Two times a day (BID) | INTRAVENOUS | Status: DC
  Administered 2019-02-27 – 2019-03-03 (×4): 10 mL

## 2019-02-27 NOTE — Progress Notes (Signed)
PROGRESS NOTE  [name] [id number] DOB: 04/13/1941 DOA: 02/23/2019 PCP: Alroy Dust, L.Marlou Sa, MD   LOS: 4 days   Brief Narrative / Interim history: 78 y.o. male with medical history significant of B-cell lymphoma, hypertension, diabetes, atrial fibrillation and previous CVA who came to the ER with generalized weakness and unable to walk.  Patient has had progressive weakness for the last week.  He was recently diagnosed with UTI and treated with IV Rocephin.  Patient has not fully recovered since then.  He has continued to be weak and debilitated.   Subjective: No chest pain, no abdominal pain, no nausea or vomiting.  No fever or chills.  Assessment & Plan: Principal Problem:   Weakness generalized Active Problems:   A-fib (HCC)   B-cell lymphoma (HCC)   Diabetes mellitus ([place])   Hypertension   Lower urinary tract infectious disease   Recurrent falls   History of CVA (cerebrovascular accident)   Leukocytosis   Principal Problem Enterococcus UTI / Aerococcus bacteremia in aerobic bottle -Patient was admitted to the hospital with generalized weakness, hypotension, significantly elevated leukocytosis with a white count of 25K and borderline hypotension. -He was initially placed on Rocephin, he was transitioned to vancomycin on 02/24/2019 when cultures were positive for GPC and urine showed enterococcus -Given unusual combination between urinary and blood cultures, case was discussed with Dr. Baxter Flattery with infectious disease, she recommends treatment with IV vancomycin for total of 7 days.  Patient is allergic to penicillin.  He has already received 4 days, he will need 3 additional days of IV vancomycin  Active Problems Recurrent falls /recent L3 compression fracture -Evaluated by Dtr. Vertell Limber with neurosurgery during his prior hospital stay on 02/09/2019, recommendations are for TLSO brace when patient is up for stability, pain management and outpatient follow-up with x-rays in 1  month. -PT evaluated patient in the hospital, home health PT has been arranged  Hypertension -Continue Norvasc, lisinopril, metoprolol, blood pressure stable today  Hyperlipidemia -Continue statin   Type 2 diabetes mellitus -Continue Lantus/sliding scale, CBGs stable  CBG (last 3)  Recent Labs    02/26/19 1626 02/26/19 2119 02/27/19 0725  GLUCAP 135* 94 72   History of non-Hodgkin's lymphoma status post chemotherapy and radiation  History of urethral strictures with recurrent UTIs -Per prior notes, do not have access to urology outpatient info.  On antibiotics as above  Paroxysmal A. fib -Remains in sinus  History of CVA -Continue Plavix, aspirin   Scheduled Meds:  amLODipine  5 mg Oral BID   aspirin EC  81 mg Oral Daily   clopidogrel  75 mg Oral Daily   enoxaparin (LOVENOX) injection  40 mg Subcutaneous Q24H   insulin aspart  0-5 Units Subcutaneous QHS   insulin aspart  0-9 Units Subcutaneous TID WC   insulin glargine  18 Units Subcutaneous Daily   lisinopril  10 mg Oral Daily   metoprolol tartrate  25 mg Oral BID   simvastatin  20 mg Oral QHS   Continuous Infusions:  vancomycin 1,000 mg (02/27/19 0421)   PRN Meds:.HYDROcodone-acetaminophen, ondansetron **OR** ondansetron (ZOFRAN) IV  DVT prophylaxis: Lovenox Code Status: Full code Family Communication: Discussed with daughter 9/5 and 9/7 Disposition Plan: TBD  Consultants:   None   Procedures:   None   Antimicrobials:  Ceftriaxone 9/3 >> 9/5  Vancomycin 9/4 >>    Objective: Vitals:   02/26/19 1626 02/26/19 2116 02/27/19 0454 02/27/19 0500  BP: (!) 135/59 129/66 (!) 139/44   Pulse: 77 65  67   Resp: 17 16 16    Temp: 98.2 F (36.8 C) 98.3 F (36.8 C) 98.5 F (36.9 C)   TempSrc: Oral Oral Oral   SpO2: 97% 96% 95% 95%  Weight:      Height:        Intake/Output Summary (Last 24 hours) at 02/27/2019 1149 Last data filed at 02/27/2019 1100 Gross per 24 hour  Intake 1660 ml   Output --  Net 1660 ml   Filed Weights   02/23/19 0957  Weight: 78 kg    Examination:  Constitutional: No distress Eyes: No scleral icterus ENMT: Moist mucous membranes Respiratory: Clear bilaterally, no wheezing, no crackles Cardiovascular: Regular rate and rhythm, no murmurs.  No edema Abdomen: Soft, nontender, nondistended Musculoskeletal: no clubbing / cyanosis.  Skin: No rashes seen Neurologic: No focal deficits Psychiatric: Normal judgment and insight. Alert and oriented x 3. Normal mood.    Data Reviewed: I have independently reviewed following labs and imaging studies   CBC: Recent Labs  Lab 02/23/19 0652 02/24/19 0410 02/25/19 0434  WBC 23.1* 14.0* 12.5*  HGB 15.5 14.1 12.4*  HCT 46.7 42.3 37.3*  MCV 91.4 90.2 90.8  PLT 225 189 [id number]   Basic Metabolic Panel: Recent Labs  Lab 02/23/19 0652 02/24/19 0410 02/25/19 0434 02/27/19 0404  NA 136 137 138 136  K 4.4 3.8 3.7 3.4*  CL 101 105 107 102  CO2 23 22 22 25   GLUCOSE 137* 135* 84 76  BUN 32* 29* 21 16  CREATININE 1.08 0.77 0.65 0.72  CALCIUM 8.7* 8.2* 7.9* 7.7*   GFR: Estimated Creatinine Clearance: 77.3 mL/min (by C-G formula based on SCr of 0.72 mg/dL). Liver Function Tests: Recent Labs  Lab 02/24/19 0410  AST 26  ALT 19  ALKPHOS 79  BILITOT 0.7  PROT 5.8*  ALBUMIN 2.3*   No results for input(s): LIPASE, AMYLASE in the last 168 hours. No results for input(s): AMMONIA in the last 168 hours. Coagulation Profile: No results for input(s): INR, PROTIME in the last 168 hours. Cardiac Enzymes: No results for input(s): CKTOTAL, CKMB, CKMBINDEX, TROPONINI in the last 168 hours. BNP (last 3 results) No results for input(s): PROBNP in the last 8760 hours. HbA1C: No results for input(s): HGBA1C in the last 72 hours. CBG: Recent Labs  Lab 02/26/19 0642 02/26/19 1159 02/26/19 1626 02/26/19 2119 02/27/19 0725  GLUCAP 103* 114* 135* 94 72   Lipid Profile: No results for input(s): CHOL,  HDL, LDLCALC, TRIG, CHOLHDL, LDLDIRECT in the last 72 hours. Thyroid Function Tests: No results for input(s): TSH, T4TOTAL, FREET4, T3FREE, THYROIDAB in the last 72 hours. Anemia Panel: No results for input(s): VITAMINB12, FOLATE, FERRITIN, TIBC, IRON, RETICCTPCT in the last 72 hours. Urine analysis:    Component Value Date/Time   COLORURINE YELLOW 02/23/2019 1057   APPEARANCEUR CLEAR 02/23/2019 1057   LABSPEC 1.035 (H) 02/23/2019 1057   PHURINE 6.0 02/23/2019 1057   GLUCOSEU NEGATIVE 02/23/2019 1057   HGBUR SMALL (A) 02/23/2019 1057   BILIRUBINUR NEGATIVE 02/23/2019 1057   KETONESUR NEGATIVE 02/23/2019 1057   PROTEINUR NEGATIVE 02/23/2019 1057   UROBILINOGEN 0.2 05/29/2014 2125   NITRITE NEGATIVE 02/23/2019 1057   LEUKOCYTESUR TRACE (A) 02/23/2019 1057   Sepsis Labs: Invalid input(s): PROCALCITONIN, LACTICIDVEN  Recent Results (from the past 240 hour(s))  Blood culture (routine x 2)     Status: Abnormal   Collection Time: 02/23/19  9:52 AM   Specimen: BLOOD  Result Value Ref Range Status   Specimen  Description BLOOD BLOOD LEFT FOREARM  Final   Special Requests   Final    BOTTLES DRAWN AEROBIC AND ANAEROBIC Blood Culture results may not be optimal due to an inadequate volume of blood received in culture bottles   Culture  Setup Time   Final    GRAM POSITIVE COCCI AEROBIC BOTTLE ONLY CRITICAL RESULT CALLED TO, READ BACK BY AND VERIFIED WITH: PHARMD ELIZABETH MARTIN 1015 [id number] FCP    Culture (A)  Final    AEROCOCCUS URINAE Standardized susceptibility testing for this organism is not available. Performed at [facility], [place] [address], [place], [place] [address]    Report Status 02/27/2019 FINAL  Final  Blood Culture ID Panel (Reflexed)     Status: None   Collection Time: 02/23/19  9:52 AM  Result Value Ref Range Status   Enterococcus species NOT DETECTED NOT DETECTED Final   Listeria monocytogenes NOT DETECTED NOT DETECTED Final   Staphylococcus species NOT  DETECTED NOT DETECTED Final   Staphylococcus aureus (BCID) NOT DETECTED NOT DETECTED Final   Streptococcus species NOT DETECTED NOT DETECTED Final   Streptococcus agalactiae NOT DETECTED NOT DETECTED Final   Streptococcus pneumoniae NOT DETECTED NOT DETECTED Final   Streptococcus pyogenes NOT DETECTED NOT DETECTED Final   Acinetobacter baumannii NOT DETECTED NOT DETECTED Final   Enterobacteriaceae species NOT DETECTED NOT DETECTED Final   Enterobacter cloacae complex NOT DETECTED NOT DETECTED Final   Escherichia coli NOT DETECTED NOT DETECTED Final   Klebsiella oxytoca NOT DETECTED NOT DETECTED Final   Klebsiella pneumoniae NOT DETECTED NOT DETECTED Final   Proteus species NOT DETECTED NOT DETECTED Final   Serratia marcescens NOT DETECTED NOT DETECTED Final   Haemophilus influenzae NOT DETECTED NOT DETECTED Final   Neisseria meningitidis NOT DETECTED NOT DETECTED Final   Pseudomonas aeruginosa NOT DETECTED NOT DETECTED Final   Candida albicans NOT DETECTED NOT DETECTED Final   Candida glabrata NOT DETECTED NOT DETECTED Final   Candida krusei NOT DETECTED NOT DETECTED Final   Candida parapsilosis NOT DETECTED NOT DETECTED Final   Candida tropicalis NOT DETECTED NOT DETECTED Final    Comment: Performed at [facility], [place] [address] [place], [place] [address]  Blood culture (routine x 2)     Status: None (Preliminary result)   Collection Time: 02/23/19  9:58 AM   Specimen: BLOOD  Result Value Ref Range Status   Specimen Description BLOOD SITE NOT SPECIFIED  Final   Special Requests   Final    BOTTLES DRAWN AEROBIC AND ANAEROBIC Blood Culture results may not be optimal due to an inadequate volume of blood received in culture bottles   Culture   Final    NO GROWTH 4 DAYS Performed at [facility], [place] [address] [place], [place] [address]    Report Status PENDING  Incomplete  SARS Coronavirus 2 [facility] order, Performed in [facility] hospital lab) Nasopharyngeal  Nasopharyngeal Swab     Status: None   Collection Time: 02/23/19 10:01 AM   Specimen: Nasopharyngeal Swab  Result Value Ref Range Status   SARS Coronavirus 2 NEGATIVE NEGATIVE Final    Comment: (NOTE) If result is NEGATIVE SARS-CoV-2 target nucleic acids are NOT DETECTED. The SARS-CoV-2 RNA is generally detectable in upper and lower  respiratory specimens during the acute phase of infection. The lowest  concentration of SARS-CoV-2 viral copies this assay can detect is 250  copies / mL. A negative result does not preclude SARS-CoV-2 infection  and should  not be used as the sole basis for treatment or other  patient management decisions.  A negative result may occur with  improper specimen collection / handling, submission of specimen other  than nasopharyngeal swab, presence of viral mutation(s) within the  areas targeted by this assay, and inadequate number of viral copies  (<250 copies / mL). A negative result must be combined with clinical  observations, patient history, and epidemiological information. If result is POSITIVE SARS-CoV-2 target nucleic acids are DETECTED. The SARS-CoV-2 RNA is generally detectable in upper and lower  respiratory specimens dur ing the acute phase of infection.  Positive  results are indicative of active infection with SARS-CoV-2.  Clinical  correlation with patient history and other diagnostic information is  necessary to determine patient infection status.  Positive results do  not rule out bacterial infection or co-infection with other viruses. If result is PRESUMPTIVE POSTIVE SARS-CoV-2 nucleic acids MAY BE PRESENT.   A presumptive positive result was obtained on the submitted specimen  and confirmed on repeat testing.  While 2019 novel coronavirus  (SARS-CoV-2) nucleic acids may be present in the submitted sample  additional confirmatory testing may be necessary for epidemiological  and / or clinical management purposes  to differentiate between   SARS-CoV-2 and other Sarbecovirus currently known to infect humans.  If clinically indicated additional testing with an alternate test  methodology [id number]) is advised. The SARS-CoV-2 RNA is generally  detectable in upper and lower respiratory sp ecimens during the acute  phase of infection. The expected result is Negative. Fact Sheet for Patients:  StrictlyIdeas.no Fact Sheet for Healthcare Providers: BankingDealers.co.za This test is not yet approved or cleared by the [place] FDA and has been authorized for detection and/or diagnosis of SARS-CoV-2 by FDA under an Emergency Use Authorization (EUA).  This EUA will remain in effect (meaning this test can be used) for the duration of the COVID-19 declaration under Section 564(b)(1) of the Act, 21 U.S.C. section 360bbb-3(b)(1), unless the authorization is terminated or revoked sooner. Performed at [facility], [place] [address], [place], [place] [address]   Urine culture     Status: Abnormal   Collection Time: 02/23/19 10:57 AM   Specimen: Urine, Random  Result Value Ref Range Status   Specimen Description URINE, RANDOM  Final   Special Requests   Final    NONE Performed at [facility], [place] [address] [place], [place] [address]    Culture >=100,000 COLONIES/mL ENTEROCOCCUS FAECALIS (A)  Final   Report Status 02/25/2019 FINAL  Final   Organism ID, Bacteria ENTEROCOCCUS FAECALIS (A)  Final      Susceptibility   Enterococcus faecalis - MIC*    AMPICILLIN <=2 SENSITIVE Sensitive     LEVOFLOXACIN >=8 RESISTANT Resistant     NITROFURANTOIN 64 INTERMEDIATE Intermediate     VANCOMYCIN 1 SENSITIVE Sensitive     * >=100,000 COLONIES/mL ENTEROCOCCUS FAECALIS      Radiology Studies: No results found.  Marzetta Board, MD, PhD [facility]  Contact via  [email or web address]  [place] P: [phone] F: [phone]

## 2019-02-27 NOTE — Progress Notes (Signed)
Physical Therapy Treatment Patient Details Name: [name] MRN: [id number] DOB: Sep 22, 1940 Today's Date: 02/27/2019    History of Present Illness Pt is a 78 y.o. M who presents from home with generalized weakness and recent UTI. Pt with significant PMH of diabetes mellitus type 2, hypertension, lymphoma in remission, A. fib not on antiocoagulation, history of stroke with right sided weakness who presents after a fall, CT showing L3 compression fracture.    PT Comments    Pt progressing with functional mobility and able to tolerate increased activity this session including gait training in the hallway using RW with min assist, seated LE exercises and ambulating within his room to/from bathroom, min assist. Pt continues to be limited by fatigue and generalized weakness, recommending home with home health PT in order to address these limitations and maximize independence with functional mobility. Pt's daughter present this session, education provided on pt's current mobility status.   Follow Up Recommendations  Home health PT;Supervision/Assistance - 24 hour     Equipment Recommendations  None recommended by PT    Recommendations for Other Services OT consult     Precautions / Restrictions      Mobility  Bed Mobility Overal bed mobility: Needs Assistance Bed Mobility: Rolling;Sidelying to Sit Rolling: Supervision Sidelying to sit: Min assist       General bed mobility comments: HOB elevated, heavy use of bed rail  Transfers Overall transfer level: Needs assistance Equipment used: Rolling walker (2 wheeled) Transfers: Sit to/from Stand Sit to Stand: Min assist         General transfer comment: min assist from sit<>stands from recliner, bed and toilet this session. Performed x 3 sit<>stands throughout session with cues for hand placement  Ambulation/Gait Ambulation/Gait assistance: Min assist Gait Distance (Feet): 40 Feet Assistive device: Rolling walker (2  wheeled) Gait Pattern/deviations: Step-through pattern;Decreased stride length;Trunk flexed Gait velocity: decreased   General Gait Details: Slow pace, increased trunk/hip flexion. Pt ambulated throughout unit this session x 40 ft with RW, pt also ambulated in/out of bathroom this session 2 x 10 ft with increased cues for safety with turning to sit on toilet   Stairs             Wheelchair Mobility    Modified Rankin (Stroke Patients Only)       Balance Overall balance assessment: Needs assistance Sitting-balance support: Feet supported Sitting balance-Leahy Scale: Fair     Standing balance support: During functional activity;Bilateral upper extremity supported Standing balance-Leahy Scale: Poor Standing balance comment: pt maintains static standing balance without UE support with min guard assist. pt requires UE support on RW for dynamic balance activities this session, min assist                            Cognition Arousal/Alertness: Awake/alert Behavior During Therapy: WFL for tasks assessed/performed Overall Cognitive Status: Within Functional Limits for tasks assessed                                 General Comments: WFL for basic mobility, will further assess      Exercises General Exercises - Lower Extremity Ankle Circles/Pumps: Strengthening;Both;10 reps Long Arc Quad: Strengthening;Both;10 reps    General Comments General comments (skin integrity, edema, etc.): Pt's daughter present throughout session, education on current level of function      Pertinent Vitals/Pain Pain Assessment: Faces Faces Pain Scale:  Hurts little more Pain Location: back Pain Descriptors / Indicators: Guarding;Grimacing    Home Living                      Prior Function            PT Goals (current goals can now be found in the care plan section) Progress towards PT goals: Progressing toward goals    Frequency    Min  3X/week      PT Plan Current plan remains appropriate    Co-evaluation              AM-PAC PT "6 Clicks" Mobility   Outcome Measure  Help needed turning from your back to your side while in a flat bed without using bedrails?: None Help needed moving from lying on your back to sitting on the side of a flat bed without using bedrails?: A Little Help needed moving to and from a bed to a chair (including a wheelchair)?: A Little Help needed standing up from a chair using your arms (e.g., wheelchair or bedside chair)?: A Little Help needed to walk in hospital room?: A Little Help needed climbing 3-5 steps with a railing? : A Little 6 Click Score: 19    End of Session Equipment Utilized During Treatment: Gait belt;Back brace Activity Tolerance: Patient tolerated treatment well Patient left: in chair;with call bell/phone within reach;with chair alarm set;with family/visitor present Nurse Communication: Mobility status PT Visit Diagnosis: Unsteadiness on feet (R26.81);History of falling (Z91.81);Difficulty in walking, not elsewhere classified (R26.2)     Time: [id number] PT Time Calculation (min) (ACUTE ONLY): 35 min  Charges:  $Gait Training: 8-22 mins $Therapeutic Activity: 8-22 mins                     Netta Corrigan, PT, DPT Acute Rehab Office [place] 02/27/2019, 3:44 PM

## 2019-02-27 NOTE — Progress Notes (Signed)
Pharmacy Antibiotic Note  [name] is a 78 y.o. male admitted on 02/23/2019 with generalized weakness.  Pharmacy has been consulted for vancomycin dosing for enterococcal UTI.  Medical hx includes: B-cell lymphoma, hypertension, diabetes, atrial fibrillation, previous CVA and recurrent falls. Patient had recent UTI (>100,000 colonies/mL Kocuria species), treated with ceftriaxone. Current urine cx growing >100,000 colonies/mL Enterococcus faecalis (amp and vanc sens, FQ resistant). Current Bcx growing aerococcus urinae, susceptibilities reported not available  WBC 23>down to 12.5 on 9/5; afebrile; Scr 0.72; CrCl 77.3 ml/min (renal function stable)  Plan: Cont Vancomycin 1 gm IV Q 12 hrs (9/4 estimated vanc AUC on this regimen, using Scr 0.8, is 519; goal vanc AUC is 400-550). Monitor renal function, cx, WBC  Height: 5\' 9"  (175.3 cm) Weight: 172 lb (78 kg) IBW/kg (Calculated) : 70.7  Temp (24hrs), Avg:98.3 F (36.8 C), Min:98.2 F (36.8 C), Max:98.5 F (36.9 C)  Recent Labs  Lab 02/23/19 0652 02/23/19 0952 02/24/19 0410 02/25/19 0434 02/27/19 0404  WBC 23.1*  --  14.0* 12.5*  --   CREATININE 1.08  --  0.77 0.65 0.72  LATICACIDVEN  --  1.9  --   --   --     Estimated Creatinine Clearance: 77.3 mL/min (by C-G formula based on SCr of 0.72 mg/dL).    Allergies  Allergen Reactions  . Prevnar [Pneumococcal 13-Val Conj Vacc] Anaphylaxis  . Actos [Pioglitazone Hydrochloride] Other (See Comments)    Afib  . Hydrochlorothiazide Other (See Comments)    Lower potassium to low  . Penicillins Rash    Tolerated cefepime, ceftriaxone  Did it involve swelling of the face/tongue/throat, SOB, or low BP? No Did it involve sudden or severe rash/hives, skin peeling, or any reaction on the inside of your mouth or nose?Yes Did you need to seek medical attention at a hospital or doctor's office. Yes When did it last happen?78 years old If all above answers are "NO", may proceed with  cephalosporin use.    Antimicrobials this admission: 9/3 ceftriaxone>> 9/5 9/4 vanc>>  Microbiology results: 9/3 BCx: 1/4 GPC growing but undetected on BCID>> aerococcus urinae, reported susc not available 9/3 UCx: >100,000 colonies/mL Enterococcus faecalis, (amp and vanc sens, FQ resistant) 9/3 COVID: negative  Thank you for allowing pharmacy to be a part of this patient's care.  Gwenlyn Fudge - Student PharmD 02/27/2019 10:13 AM

## 2019-02-28 DIAGNOSIS — B952 Enterococcus as the cause of diseases classified elsewhere: Secondary | ICD-10-CM

## 2019-02-28 DIAGNOSIS — N390 Urinary tract infection, site not specified: Secondary | ICD-10-CM

## 2019-02-28 LAB — CBC
HCT: 37.9 % — ABNORMAL LOW (ref 39.0–52.0)
Hemoglobin: 12.7 g/dL — ABNORMAL LOW (ref 13.0–17.0)
MCH: 30.6 pg (ref 26.0–34.0)
MCHC: 33.5 g/dL (ref 30.0–36.0)
MCV: 91.3 fL (ref 80.0–100.0)
Platelets: 227 10*3/uL (ref 150–400)
RBC: 4.15 MIL/uL — ABNORMAL LOW (ref 4.22–5.81)
RDW: 12.9 % (ref 11.5–15.5)
WBC: 11.8 10*3/uL — ABNORMAL HIGH (ref 4.0–10.5)
nRBC: 0 % (ref 0.0–0.2)

## 2019-02-28 LAB — BASIC METABOLIC PANEL
Anion gap: 7 (ref 5–15)
BUN: 21 mg/dL (ref 8–23)
CO2: 26 mmol/L (ref 22–32)
Calcium: 8.1 mg/dL — ABNORMAL LOW (ref 8.9–10.3)
Chloride: 106 mmol/L (ref 98–111)
Creatinine, Ser: 0.77 mg/dL (ref 0.61–1.24)
GFR calc Af Amer: >60 mL/min (ref >60)
GFR calc non Af Amer: >60 mL/min (ref >60)
Glucose, Bld: 67 mg/dL — ABNORMAL LOW (ref 70–99)
Potassium: 3.7 mmol/L (ref 3.5–5.1)
Sodium: 139 mmol/L (ref 135–145)

## 2019-02-28 LAB — CULTURE, BLOOD (ROUTINE X 2): Culture: NO GROWTH

## 2019-02-28 LAB — GLUCOSE, CAPILLARY
Glucose-Capillary: 87 mg/dL (ref 70–99)
Glucose-Capillary: 101 mg/dL — ABNORMAL HIGH (ref 70–99)
Glucose-Capillary: 138 mg/dL — ABNORMAL HIGH (ref 70–99)
Glucose-Capillary: 137 mg/dL — ABNORMAL HIGH (ref 70–99)

## 2019-02-28 NOTE — Progress Notes (Signed)
PROGRESS NOTE  DORAL WENHOLD [id number] DOB: Oct 01, 1940 DOA: 02/23/2019 PCP: Alroy Dust, L.Marlou Sa, MD   LOS: 5 days   Brief Narrative / Interim history: 78 y.o. male with medical history significant of B-cell lymphoma, hypertension, diabetes, atrial fibrillation and previous CVA who came to the ER with generalized weakness and unable to walk.  Patient has had progressive weakness for the last week.  He was recently diagnosed with UTI and treated with IV Rocephin.  Patient has not fully recovered since then.  He has continued to be weak and debilitated.   Subjective: Feels well, no complaints  Assessment & Plan: Principal Problem:   Weakness generalized Active Problems:   A-fib (HCC)   B-cell lymphoma (HCC)   Diabetes mellitus (HCC)   Hypertension   Lower urinary tract infectious disease   Recurrent falls   History of CVA (cerebrovascular accident)   Leukocytosis   Principal Problem Enterococcus UTI / Aerococcus bacteremia in aerobic bottle -Patient was admitted to the hospital with generalized weakness, hypotension, significantly elevated leukocytosis with a white count of 25K and borderline hypotension. -He was initially placed on Rocephin, he was transitioned to vancomycin on 02/24/2019 when cultures were positive for GPC and urine showed enterococcus -Given unusual combination between urinary and blood cultures, case was discussed with Dr. Baxter Flattery with infectious disease, she recommends treatment with IV vancomycin for total of 7 days.  Patient is allergic to penicillin.  He has already received 5 days, he will need 2 additional days of IV vancomycin  Active Problems Recurrent falls /recent L3 compression fracture -Evaluated by Dtr. Vertell Limber with neurosurgery during his prior hospital stay on 02/09/2019, recommendations are for TLSO brace when patient is up for stability, pain management and outpatient follow-up with x-rays in 1 month. -PT evaluated patient in the hospital, home  health PT has been arranged  Hypertension -Continue Norvasc, lisinopril, metoprolol, blood pressure stable today  Hyperlipidemia -Continue statin   Type 2 diabetes mellitus -Continue Lantus/sliding scale, CBGs stable  CBG (last 3)  Recent Labs    02/27/19 1717 02/27/19 2150 02/28/19 0631  GLUCAP 145* 123* 87   History of non-Hodgkin's lymphoma status post chemotherapy and radiation  History of urethral strictures with recurrent UTIs -Per prior notes, do not have access to urology outpatient info.  On antibiotics as above  Paroxysmal A. fib -Remains in sinus  History of CVA -Continue Plavix, aspirin   Scheduled Meds: . amLODipine  5 mg Oral BID  . aspirin EC  81 mg Oral Daily  . clopidogrel  75 mg Oral Daily  . enoxaparin (LOVENOX) injection  40 mg Subcutaneous Q24H  . insulin aspart  0-5 Units Subcutaneous QHS  . insulin aspart  0-9 Units Subcutaneous TID WC  . insulin glargine  18 Units Subcutaneous Daily  . lisinopril  10 mg Oral Daily  . metoprolol tartrate  25 mg Oral BID  . simvastatin  20 mg Oral QHS  . sodium chloride flush  10-40 mL Intracatheter Q12H   Continuous Infusions: . vancomycin 1,000 mg (02/28/19 0455)   PRN Meds:.HYDROcodone-acetaminophen, ondansetron **OR** ondansetron (ZOFRAN) IV, sodium chloride flush  DVT prophylaxis: Lovenox Code Status: Full code Family Communication: Discussed with daughter 9/5 and 9/7 Disposition Plan: TBD  Consultants:   None   Procedures:   None   Antimicrobials:  Ceftriaxone 9/3 >> 9/5  Vancomycin 9/4 >>    Objective: Vitals:   02/27/19 1557 02/27/19 2138 02/28/19 0434 02/28/19 0912  BP: (!) 115/55 (!) 141/61 122/65 136/62  Pulse:  72 75 65 77  Resp: 14 16 16 16   Temp: 98.4 F (36.9 C) 98.3 F (36.8 C) 97.9 F (36.6 C) 98.4 F (36.9 C)  TempSrc: Oral Oral Oral Oral  SpO2: 97% 99% 94% 98%  Weight:      Height:        Intake/Output Summary (Last 24 hours) at 02/28/2019 1149 Last data filed  at 02/27/2019 1500 Gross per 24 hour  Intake 120 ml  Output -  Net 120 ml   Filed Weights   02/23/19 0957  Weight: 78 kg    Examination:  Constitutional: NAD Respiratory: CTA Cardiovascular: RRR   Data Reviewed: I have independently reviewed following labs and imaging studies   CBC: Recent Labs  Lab 02/23/19 0652 02/24/19 0410 02/25/19 0434 02/28/19 0410  WBC 23.1* 14.0* 12.5* 11.8*  HGB 15.5 14.1 12.4* 12.7*  HCT 46.7 42.3 37.3* 37.9*  MCV 91.4 90.2 90.8 91.3  PLT 225 189 170 [id number]   Basic Metabolic Panel: Recent Labs  Lab 02/23/19 0652 02/24/19 0410 02/25/19 0434 02/27/19 0404 02/28/19 0410  NA 136 137 138 136 139  K 4.4 3.8 3.7 3.4* 3.7  CL 101 105 107 102 106  CO2 23 22 22 25 26   GLUCOSE 137* 135* 84 76 67*  BUN 32* 29* 21 16 21   CREATININE 1.08 0.77 0.65 0.72 0.77  CALCIUM 8.7* 8.2* 7.9* 7.7* 8.1*   GFR: Estimated Creatinine Clearance: 77.3 mL/min (by C-G formula based on SCr of 0.77 mg/dL). Liver Function Tests: Recent Labs  Lab 02/24/19 0410  AST 26  ALT 19  ALKPHOS 79  BILITOT 0.7  PROT 5.8*  ALBUMIN 2.3*   No results for input(s): LIPASE, AMYLASE in the last 168 hours. No results for input(s): AMMONIA in the last 168 hours. Coagulation Profile: No results for input(s): INR, PROTIME in the last 168 hours. Cardiac Enzymes: No results for input(s): CKTOTAL, CKMB, CKMBINDEX, TROPONINI in the last 168 hours. BNP (last 3 results) No results for input(s): PROBNP in the last 8760 hours. HbA1C: No results for input(s): HGBA1C in the last 72 hours. CBG: Recent Labs  Lab 02/27/19 0725 02/27/19 1210 02/27/19 1717 02/27/19 2150 02/28/19 0631  GLUCAP 72 104* 145* 123* 87   Lipid Profile: No results for input(s): CHOL, HDL, LDLCALC, TRIG, CHOLHDL, LDLDIRECT in the last 72 hours. Thyroid Function Tests: No results for input(s): TSH, T4TOTAL, FREET4, T3FREE, THYROIDAB in the last 72 hours. Anemia Panel: No results for input(s): VITAMINB12,  FOLATE, FERRITIN, TIBC, IRON, RETICCTPCT in the last 72 hours. Urine analysis:    Component Value Date/Time   COLORURINE YELLOW 02/23/2019 1057   APPEARANCEUR CLEAR 02/23/2019 1057   LABSPEC 1.035 (H) 02/23/2019 1057   PHURINE 6.0 02/23/2019 1057   GLUCOSEU NEGATIVE 02/23/2019 1057   HGBUR SMALL (A) 02/23/2019 1057   BILIRUBINUR NEGATIVE 02/23/2019 1057   KETONESUR NEGATIVE 02/23/2019 1057   PROTEINUR NEGATIVE 02/23/2019 1057   UROBILINOGEN 0.2 05/29/2014 2125   NITRITE NEGATIVE 02/23/2019 1057   LEUKOCYTESUR TRACE (A) 02/23/2019 1057   Sepsis Labs: Invalid input(s): PROCALCITONIN, LACTICIDVEN  Recent Results (from the past 240 hour(s))  Blood culture (routine x 2)     Status: Abnormal   Collection Time: 02/23/19  9:52 AM   Specimen: BLOOD  Result Value Ref Range Status   Specimen Description BLOOD BLOOD LEFT FOREARM  Final   Special Requests   Final    BOTTLES DRAWN AEROBIC AND ANAEROBIC Blood Culture results may not be optimal due to  an inadequate volume of blood received in culture bottles   Culture  Setup Time   Final    GRAM POSITIVE COCCI AEROBIC BOTTLE ONLY CRITICAL RESULT CALLED TO, READ BACK BY AND VERIFIED WITH: PHARMD ELIZABETH MARTIN 1015 [id number] FCP    Culture (A)  Final    AEROCOCCUS URINAE Standardized susceptibility testing for this organism is not available. Performed at [facility], [place] [address], [place], [place] [address]    Report Status 02/27/2019 FINAL  Final  Blood Culture ID Panel (Reflexed)     Status: None   Collection Time: 02/23/19  9:52 AM  Result Value Ref Range Status   Enterococcus species NOT DETECTED NOT DETECTED Final   Listeria monocytogenes NOT DETECTED NOT DETECTED Final   Staphylococcus species NOT DETECTED NOT DETECTED Final   Staphylococcus aureus (BCID) NOT DETECTED NOT DETECTED Final   Streptococcus species NOT DETECTED NOT DETECTED Final   Streptococcus agalactiae NOT DETECTED NOT DETECTED Final   Streptococcus  pneumoniae NOT DETECTED NOT DETECTED Final   Streptococcus pyogenes NOT DETECTED NOT DETECTED Final   Acinetobacter baumannii NOT DETECTED NOT DETECTED Final   Enterobacteriaceae species NOT DETECTED NOT DETECTED Final   Enterobacter cloacae complex NOT DETECTED NOT DETECTED Final   Escherichia coli NOT DETECTED NOT DETECTED Final   Klebsiella oxytoca NOT DETECTED NOT DETECTED Final   Klebsiella pneumoniae NOT DETECTED NOT DETECTED Final   Proteus species NOT DETECTED NOT DETECTED Final   Serratia marcescens NOT DETECTED NOT DETECTED Final   Haemophilus influenzae NOT DETECTED NOT DETECTED Final   Neisseria meningitidis NOT DETECTED NOT DETECTED Final   Pseudomonas aeruginosa NOT DETECTED NOT DETECTED Final   Candida albicans NOT DETECTED NOT DETECTED Final   Candida glabrata NOT DETECTED NOT DETECTED Final   Candida krusei NOT DETECTED NOT DETECTED Final   Candida parapsilosis NOT DETECTED NOT DETECTED Final   Candida tropicalis NOT DETECTED NOT DETECTED Final    Comment: Performed at [facility], [place] [address] [place], [place] [address]  Blood culture (routine x 2)     Status: None   Collection Time: 02/23/19  9:58 AM   Specimen: BLOOD  Result Value Ref Range Status   Specimen Description BLOOD SITE NOT SPECIFIED  Final   Special Requests   Final    BOTTLES DRAWN AEROBIC AND ANAEROBIC Blood Culture results may not be optimal due to an inadequate volume of blood received in culture bottles   Culture   Final    NO GROWTH 5 DAYS Performed at [facility], [place] [address], [place], [place] [address]    Report Status 02/28/2019 FINAL  Final  SARS Coronavirus 2 [facility] order, Performed in [facility] hospital lab) Nasopharyngeal Nasopharyngeal Swab     Status: None   Collection Time: 02/23/19 10:01 AM   Specimen: Nasopharyngeal Swab  Result Value Ref Range Status   SARS Coronavirus 2 NEGATIVE NEGATIVE Final    Comment: (NOTE) If result is NEGATIVE SARS-CoV-2 target  nucleic acids are NOT DETECTED. The SARS-CoV-2 RNA is generally detectable in upper and lower  respiratory specimens during the acute phase of infection. The lowest  concentration of SARS-CoV-2 viral copies this assay can detect is 250  copies / mL. A negative result does not preclude SARS-CoV-2 infection  and should not be used as the sole basis for treatment or other  patient management decisions.  A negative result may occur with  improper specimen collection / handling, submission of specimen other  than nasopharyngeal swab, presence of viral mutation(s) within the  areas targeted by this assay, and inadequate number of viral copies  (<250 copies / mL). A negative result must be combined with clinical  observations, patient history, and epidemiological information. If result is POSITIVE SARS-CoV-2 target nucleic acids are DETECTED. The SARS-CoV-2 RNA is generally detectable in upper and lower  respiratory specimens dur ing the acute phase of infection.  Positive  results are indicative of active infection with SARS-CoV-2.  Clinical  correlation with patient history and other diagnostic information is  necessary to determine patient infection status.  Positive results do  not rule out bacterial infection or co-infection with other viruses. If result is PRESUMPTIVE POSTIVE SARS-CoV-2 nucleic acids MAY BE PRESENT.   A presumptive positive result was obtained on the submitted specimen  and confirmed on repeat testing.  While 2019 novel coronavirus  (SARS-CoV-2) nucleic acids may be present in the submitted sample  additional confirmatory testing may be necessary for epidemiological  and / or clinical management purposes  to differentiate between  SARS-CoV-2 and other Sarbecovirus currently known to infect humans.  If clinically indicated additional testing with an alternate test  methodology [phone]) is advised. The SARS-CoV-2 RNA is generally  detectable in upper and lower  respiratory sp ecimens during the acute  phase of infection. The expected result is Negative. Fact Sheet for Patients:  StrictlyIdeas.no Fact Sheet for Healthcare Providers: BankingDealers.co.za This test is not yet approved or cleared by the [place] FDA and has been authorized for detection and/or diagnosis of SARS-CoV-2 by FDA under an Emergency Use Authorization (EUA).  This EUA will remain in effect (meaning this test can be used) for the duration of the COVID-19 declaration under Section 564(b)(1) of the Act, 21 U.S.C. section 360bbb-3(b)(1), unless the authorization is terminated or revoked sooner. Performed at [facility], [place] [address], [place], [place] [address]   Urine culture     Status: Abnormal   Collection Time: 02/23/19 10:57 AM   Specimen: Urine, Random  Result Value Ref Range Status   Specimen Description URINE, RANDOM  Final   Special Requests   Final    NONE Performed at [facility], [place] [address], [place], [place] [address]    Culture >=100,000 COLONIES/mL ENTEROCOCCUS FAECALIS (A)  Final   Report Status 02/25/2019 FINAL  Final   Organism ID, Bacteria ENTEROCOCCUS FAECALIS (A)  Final      Susceptibility   Enterococcus faecalis - MIC*    AMPICILLIN <=2 SENSITIVE Sensitive     LEVOFLOXACIN >=8 RESISTANT Resistant     NITROFURANTOIN 64 INTERMEDIATE Intermediate     VANCOMYCIN 1 SENSITIVE Sensitive     * >=100,000 COLONIES/mL ENTEROCOCCUS FAECALIS      Radiology Studies: No results found.  Marzetta Board, MD, PhD [facility]  Contact via  [email or web address]  [place] P: [phone] F: [phone]

## 2019-02-28 NOTE — Evaluation (Signed)
Occupational Therapy Evaluation Patient Details Name: [name] MRN: [id number] DOB: August 05, 1940 Today's Date: 02/28/2019    History of Present Illness Pt is a 78 y.o. M who presents from home with generalized weakness and recent UTI. Pt with significant PMH of diabetes mellitus type 2, hypertension, lymphoma in remission, A. fib not on antiocoagulation, history of stroke with right sided weakness who presents after a fall, CT showing L3 compression fracture.   Clinical Impression   This 78 yo male admitted with above presents to acute OT with increased back pain with movement, decreased balance, decreased mobility and flexibility due to back pain all affecting his PLOF of being able to manage his basic ADLs by himself. He will benefit from acute OT with follow up [place].    Follow Up Recommendations  Home health OT;Supervision/Assistance - 24 hour    Equipment Recommendations  None recommended by OT       Precautions / Restrictions Precautions Precautions: Fall;Back Precaution Comments: verbally reviewed precautions Required Braces or Orthoses: Spinal Brace Spinal Brace: Thoracolumbosacral orthotic;Applied in sitting position Restrictions Weight Bearing Restrictions: No      Mobility Bed Mobility Overal bed mobility: Needs Assistance Bed Mobility: Rolling;Sidelying to Sit Rolling: Supervision Sidelying to sit: Supervision;HOB elevated       General bed mobility comments: Pt reports he has ordered a bed that will allow him to adjust head, foot, and height of bed  Transfers Overall transfer level: Needs assistance Equipment used: Rolling walker (2 wheeled) Transfers: Sit to/from Stand Sit to Stand: Min assist;From elevated surface              Balance Overall balance assessment: Needs assistance Sitting-balance support: Bilateral upper extremity supported;Feet supported Sitting balance-Leahy Scale: Poor     Standing balance support: During functional  activity;Bilateral upper extremity supported Standing balance-Leahy Scale: Poor                             ADL either performed or assessed with clinical judgement   ADL Overall ADL's : Needs assistance/impaired Eating/Feeding: Independent;Sitting   Grooming: Set up;Sitting   Upper Body Bathing: Set up;Sitting   Lower Body Bathing: Minimal assistance;Sit to/from stand   Upper Body Dressing : Set up;Sitting   Lower Body Dressing: Minimal assistance;Sit to/from stand   Toilet Transfer: Minimal assistance;Ambulation;RW Toilet Transfer Details (indicate cue type and reason): Bed> ambulated 100 feet>recliner Toileting- Clothing Manipulation and Hygiene: Minimal assistance;Sit to/from stand         General ADL Comments: Spoke to dtr on phone, she no longer has a tub seat so if pt feels he needs one (normally on sponge baths) then she is aware they will need to get one for him. Max A to don brace at EOB due to pain and inability to let go with both hands at same time     Vision Patient Visual Report: No change from baseline              Pertinent Vitals/Pain Pain Assessment: Faces Faces Pain Scale: Hurts little more Pain Location: back with sitting up before brace on Pain Descriptors / Indicators: Guarding;Grimacing Pain Intervention(s): Limited activity within patient's tolerance;Monitored during session;Repositioned     Hand Dominance Right   Extremity/Trunk Assessment Upper Extremity Assessment Upper Extremity Assessment: Overall WFL for tasks assessed           Communication Communication Communication: No difficulties   Cognition Arousal/Alertness: Awake/alert Behavior During Therapy: [facility] for tasks  assessed/performed Overall Cognitive Status: Within Functional Limits for tasks assessed                                                Home Living Family/patient expects to be discharged to:: Private residence Living  Arrangements: Children Available Help at Discharge: Family;Available 24 hours/day Type of Home: House Home Access: Stairs to enter [employer] of Steps: 4 Entrance Stairs-Rails: Right Home Layout: One level     Bathroom Shower/Tub: Tub/shower unit;Curtain   Biochemist, clinical: Standard     Home Equipment: Cane - single point;Cane - quad;Walker - 2 wheels;Wheelchair - manual;Bedside commode   Additional Comments: Plans to d/c home with daughter, typically lives alone      Prior Functioning/Environment Level of Independence: Needs assistance  Gait / Transfers Assistance Needed: uses SPC inside, quad cane outside ADL's / [employer] Assistance Needed: drives, bird baths, microwaves meals, manages medication/finances, calls in groceries to [employer]            OT Problem List: Decreased range of motion;Impaired balance (sitting and/or standing);Pain      OT Treatment/Interventions: Self-care/ADL training;DME and/or AE instruction;Patient/family education;Balance training    OT Goals(Current goals can be found in the care plan section) Acute Rehab OT Goals Patient Stated Goal: to go to my dtrs home and get better OT Goal Formulation: With patient Time For Goal Achievement: 03/14/19 Potential to Achieve Goals: Good  OT Frequency: Min 2X/week              AM-PAC OT "6 Clicks" Daily Activity     Outcome Measure Help from another person eating meals?: None Help from another person taking care of personal grooming?: A Little Help from another person toileting, which includes using toliet, bedpan, or urinal?: A Little Help from another person bathing (including washing, rinsing, drying)?: A Little Help from another person to put on and taking off regular upper body clothing?: A Little Help from another person to put on and taking off regular lower body clothing?: A Little 6 Click Score: 19   End of Session Equipment Utilized During Treatment: Gait belt;Rolling  walker;Back brace  Activity Tolerance: Patient tolerated treatment well Patient left: in chair;with call bell/phone within reach;with chair alarm set  OT Visit Diagnosis: Other abnormalities of gait and mobility (R26.89);Unsteadiness on feet (R26.81);Pain Pain - part of body: (back)                Time: [id number] OT Time Calculation (min): 28 min Charges:  OT General Charges $OT Visit: 1 Visit OT Evaluation $OT Eval Moderate Complexity: 1 Mod OT Treatments $Self Care/Home Management : 8-22 mins  [place], OTR/L Acute [employer] Pager [phone] Office [phone]     Almon Register 02/28/2019, 1:19 PM

## 2019-02-28 NOTE — Plan of Care (Signed)

## 2019-03-01 DIAGNOSIS — R7881 Bacteremia: Secondary | ICD-10-CM

## 2019-03-01 DIAGNOSIS — I4891 Unspecified atrial fibrillation: Secondary | ICD-10-CM

## 2019-03-01 DIAGNOSIS — R296 Repeated falls: Secondary | ICD-10-CM

## 2019-03-01 LAB — GLUCOSE, CAPILLARY
Glucose-Capillary: 88 mg/dL (ref 70–99)
Glucose-Capillary: 98 mg/dL (ref 70–99)
Glucose-Capillary: 138 mg/dL — ABNORMAL HIGH (ref 70–99)
Glucose-Capillary: 146 mg/dL — ABNORMAL HIGH (ref 70–99)

## 2019-03-01 MED ORDER — ACETAMINOPHEN 500 MG PO TABS
1000.0000 mg | ORAL_TABLET | Freq: Four times a day (QID) | ORAL | Status: DC | PRN
  Administered 2019-03-01 – 2019-03-03 (×4): 1000 mg via ORAL
  Filled 2019-03-01 (×4): qty 2

## 2019-03-01 NOTE — Progress Notes (Signed)
PT Cancellation Note  Patient Details Name: [name] MRN: [id number] DOB: 18-Nov-1940   Cancelled Treatment:    Reason Eval/Treat Not Completed: (P) Patient declined, no reason specified;Pain limiting ability to participate(PTA entered room on arrival and patient reports, "I'm not doing therapy I have a broke back."  PTA educated on the benefit of functional mobility and he became quickly agitated.  Unable to reason with patient and left room.)   Aries Townley Eli Hose 03/01/2019, 3:39 PM  Governor Rooks, PTA Acute Rehabilitation Services Pager [phone] Office [phone]

## 2019-03-01 NOTE — Care Management Important Message (Signed)
Important Message  Patient Details  Name: [name] MRN: [id number] Date of Birth: [birth year]   Medicare Important Message Given:  Yes     Memory Argue 03/01/2019, 4:26 PM

## 2019-03-01 NOTE — Progress Notes (Signed)
PROGRESS NOTE  [name] [id number] DOB: 1940/09/23   PCP: Alroy Dust, L.Marlou Sa, MD  Patient is from: Home  DOA: 02/23/2019 LOS: 6  Brief Narrative / Interim history: 78 y.o.malewith medical history significant ofB-cell lymphoma, hypertension, diabetes, atrial fibrillation and previous CVA who came to the ER with generalized weakness and unable to walk. Patient has had progressive weakness for the last week. He was recently diagnosed with UTI and treated with IV Rocephin. Patient has not fully recovered since then. He has continued to be weak and debilitated.   Subjective: No major events overnight of this morning.  Complains about left lower back pain.  He says he take 2 of the extra strength Tylenol at home.  He voices concern about getting addicted to [facility] and tries to avoid.  He reports bilateral lower extremity numbness below his knee which is chronic.  He also reports chronic right lower extremity weakness.  Denies new focal neuro symptoms.  Denies chest pain, dyspnea, abdominal pain, nausea, vomiting, bowel or bladder issue.  Objective: Vitals:   02/28/19 2049 03/01/19 0529 03/01/19 0829 03/01/19 1415  BP: 135/67 (!) 142/61 (!) 138/59 (!) 140/55  Pulse: 79 67 72 71  Resp: 14 16 16 16   Temp: 99.4 F (37.4 C) 98.2 F (36.8 C) 97.7 F (36.5 C) 98.6 F (37 C)  TempSrc: Oral Oral Oral Oral  SpO2: 97% 98% 97% 98%  Weight:      Height:        Intake/Output Summary (Last 24 hours) at 03/01/2019 1653 Last data filed at 03/01/2019 1300 Gross per 24 hour  Intake 1360 ml  Output -  Net 1360 ml   Filed Weights   02/23/19 0957  Weight: 78 kg    Examination:  GENERAL: No acute distress.  Appears well.  HEENT: MMM.  Vision and hearing grossly intact.  NECK: Supple.  No apparent JVD.  RESP:  No IWOB. Good air movement bilaterally. CVS:  RRR. Heart sounds normal.  ABD/GI/GU: Bowel sounds present. Soft. Non tender.  MSK/EXT:  Moves extremities. No apparent deformity  or edema.  Tenderness over left lumbosacral paraspinal muscles.  Mild ecchymosis over left hip. SKIN: Mild ecchymosis over left hip. NEURO: Awake, alert and oriented appropriately.  Paresthesia in both lower extremities below his knees.  Motor strength 4/5 in right lower extremity, 5/5 elsewhere. PSYCH: Calm. Normal affect.   Assessment & Plan: Sepsis due to enterococcus UTI / Aerococcus bacteremia in aerobic bottle -Sepsis physiology resolved. -Allergic to penicillin. -Rocephin>>vancomycin on 02/24/2019-plan to complete 7 days course per ID recommendation  Recurrent falls /recent L3 compression fracture -Evaluated by NS, Dr. Vertell Limber during his previous hospitalization on 8/20 -TLSO brace when patient is up for stability, pain management and outpatient follow-up with x-rays in 1 month. -PT recommended home health PT  Hypertension: Normotensive. -Continue Norvasc, lisinopril and metoprolol,  Hyperlipidemia -Continue statin   Type 2 diabetes mellitus: Controlled. -Continue Lantus/sliding scale, CBGs stable CBG (last 3)  Recent Labs    03/01/19 0658 03/01/19 1139 03/01/19 1643  GLUCAP 88 98 138*    History of non-Hodgkin's lymphoma status post chemotherapy and radiation -Outpatient follow-up.  History of urethral strictures with recurrent UTIs -On antibiotics as above -Outpatient urology follow-up  Paroxysmal A. fib: Unclear about this diagnosis.  His EKG back to 2015 did not reveal A. fib.  He is on metoprolol.  Not on anticoagulation.  But on Plavix and aspirin for CVA. -We will continue home medication now -Encourage clarification with his PCP  about the diagnosis of A. fib.  History of CVA: Residual right lower extremity weakness. -Continue Plavix, aspirin  DVT prophylaxis: Subcu Lovenox. Code Status: Full code Family Communication: Patient and/or RN. Available if any question. Disposition Plan: Remains inpatient to complete IV vancomycin.  Anticipate discharge on  9/11 Consultants: ID  Procedures:  None  Microbiology summarized: 9/3-blood culture Aerococcus urinae in aerobic bottles 9/3-urine culture Enterococcus faecalis resistant to Levaquin and nitrofurantoin 9/3-COVID-19 negative  Antimicrobials: Anti-infectives (From admission, onward)   Start     Dose/Rate Route Frequency Ordered Stop   02/24/19 1600  vancomycin (VANCOCIN) IVPB 1000 mg/200 mL premix     1,000 mg 200 mL/hr over 60 Minutes Intravenous Every 12 hours 02/24/19 1534     02/24/19 0000  cefTRIAXone (ROCEPHIN) 1 g in sodium chloride 0.9 % 100 mL IVPB  Status:  Discontinued     1 g 200 mL/hr over 30 Minutes Intravenous Every 24 hours 02/23/19 2253 02/25/19 1109   02/23/19 0930  cefTRIAXone (ROCEPHIN) 1 g in sodium chloride 0.9 % 100 mL IVPB     1 g 200 mL/hr over 30 Minutes Intravenous  Once 02/23/19 0921 02/23/19 1026      Sch Meds:  Scheduled Meds: . amLODipine  5 mg Oral BID  . aspirin EC  81 mg Oral Daily  . clopidogrel  75 mg Oral Daily  . enoxaparin (LOVENOX) injection  40 mg Subcutaneous Q24H  . insulin aspart  0-5 Units Subcutaneous QHS  . insulin aspart  0-9 Units Subcutaneous TID WC  . insulin glargine  18 Units Subcutaneous Daily  . lisinopril  10 mg Oral Daily  . metoprolol tartrate  25 mg Oral BID  . simvastatin  20 mg Oral QHS  . sodium chloride flush  10-40 mL Intracatheter Q12H   Continuous Infusions: . vancomycin 1,000 mg (03/01/19 1604)   PRN Meds:.acetaminophen, HYDROcodone-acetaminophen, ondansetron **OR** ondansetron (ZOFRAN) IV, sodium chloride flush   I have personally reviewed the following labs and images: CBC: Recent Labs  Lab 02/23/19 0652 02/24/19 0410 02/25/19 0434 02/28/19 0410  WBC 23.1* 14.0* 12.5* 11.8*  HGB 15.5 14.1 12.4* 12.7*  HCT 46.7 42.3 37.3* 37.9*  MCV 91.4 90.2 90.8 91.3  PLT 225 189 170 227   BMP &GFR Recent Labs  Lab 02/23/19 0652 02/24/19 0410 02/25/19 0434 02/27/19 0404 02/28/19 0410  NA 136 137 138  136 139  K 4.4 3.8 3.7 3.4* 3.7  CL 101 105 107 102 106  CO2 23 22 22 25 26   GLUCOSE 137* 135* 84 76 67*  BUN 32* 29* 21 16 21   CREATININE 1.08 0.77 0.65 0.72 0.77  CALCIUM 8.7* 8.2* 7.9* 7.7* 8.1*   Estimated Creatinine Clearance: 77.3 mL/min (by C-G formula based on SCr of 0.77 mg/dL). Liver & Pancreas: Recent Labs  Lab 02/24/19 0410  AST 26  ALT 19  ALKPHOS 79  BILITOT 0.7  PROT 5.8*  ALBUMIN 2.3*   No results for input(s): LIPASE, AMYLASE in the last 168 hours. No results for input(s): AMMONIA in the last 168 hours. Diabetic: No results for input(s): HGBA1C in the last 72 hours. Recent Labs  Lab 02/28/19 1659 02/28/19 2105 03/01/19 0658 03/01/19 1139 03/01/19 1643  GLUCAP 138* 137* 88 98 138*   Cardiac Enzymes: No results for input(s): CKTOTAL, CKMB, CKMBINDEX, TROPONINI in the last 168 hours. No results for input(s): PROBNP in the last 8760 hours. Coagulation Profile: No results for input(s): INR, PROTIME in the last 168 hours. Thyroid Function  Tests: No results for input(s): TSH, T4TOTAL, FREET4, T3FREE, THYROIDAB in the last 72 hours. Lipid Profile: No results for input(s): CHOL, HDL, LDLCALC, TRIG, CHOLHDL, LDLDIRECT in the last 72 hours. Anemia Panel: No results for input(s): VITAMINB12, FOLATE, FERRITIN, TIBC, IRON, RETICCTPCT in the last 72 hours. Urine analysis:    Component Value Date/Time   COLORURINE YELLOW 02/23/2019 1057   APPEARANCEUR CLEAR 02/23/2019 1057   LABSPEC 1.035 (H) 02/23/2019 1057   PHURINE 6.0 02/23/2019 1057   GLUCOSEU NEGATIVE 02/23/2019 1057   HGBUR SMALL (A) 02/23/2019 1057   BILIRUBINUR NEGATIVE 02/23/2019 1057   KETONESUR NEGATIVE 02/23/2019 1057   PROTEINUR NEGATIVE 02/23/2019 1057   UROBILINOGEN 0.2 05/29/2014 2125   NITRITE NEGATIVE 02/23/2019 1057   LEUKOCYTESUR TRACE (A) 02/23/2019 1057   Sepsis Labs: Invalid input(s): PROCALCITONIN, Vera  Microbiology: Recent Results (from the past 240 hour(s))  Blood  culture (routine x 2)     Status: Abnormal   Collection Time: 02/23/19  9:52 AM   Specimen: BLOOD  Result Value Ref Range Status   Specimen Description BLOOD BLOOD LEFT FOREARM  Final   Special Requests   Final    BOTTLES DRAWN AEROBIC AND ANAEROBIC Blood Culture results may not be optimal due to an inadequate volume of blood received in culture bottles   Culture  Setup Time   Final    GRAM POSITIVE COCCI AEROBIC BOTTLE ONLY CRITICAL RESULT CALLED TO, READ BACK BY AND VERIFIED WITH: PHARMD Bowlus [id number] FCP    Culture (A)  Final    AEROCOCCUS URINAE Standardized susceptibility testing for this organism is not available. Performed at [facility], [place] [address], [place], [place] [address]    Report Status 02/27/2019 FINAL  Final  Blood Culture ID Panel (Reflexed)     Status: None   Collection Time: 02/23/19  9:52 AM  Result Value Ref Range Status   Enterococcus species NOT DETECTED NOT DETECTED Final   Listeria monocytogenes NOT DETECTED NOT DETECTED Final   Staphylococcus species NOT DETECTED NOT DETECTED Final   Staphylococcus aureus (BCID) NOT DETECTED NOT DETECTED Final   Streptococcus species NOT DETECTED NOT DETECTED Final   Streptococcus agalactiae NOT DETECTED NOT DETECTED Final   Streptococcus pneumoniae NOT DETECTED NOT DETECTED Final   Streptococcus pyogenes NOT DETECTED NOT DETECTED Final   Acinetobacter baumannii NOT DETECTED NOT DETECTED Final   Enterobacteriaceae species NOT DETECTED NOT DETECTED Final   Enterobacter cloacae complex NOT DETECTED NOT DETECTED Final   Escherichia coli NOT DETECTED NOT DETECTED Final   Klebsiella oxytoca NOT DETECTED NOT DETECTED Final   Klebsiella pneumoniae NOT DETECTED NOT DETECTED Final   Proteus species NOT DETECTED NOT DETECTED Final   Serratia marcescens NOT DETECTED NOT DETECTED Final   Haemophilus influenzae NOT DETECTED NOT DETECTED Final   Neisseria meningitidis NOT DETECTED NOT DETECTED Final    Pseudomonas aeruginosa NOT DETECTED NOT DETECTED Final   Candida albicans NOT DETECTED NOT DETECTED Final   Candida glabrata NOT DETECTED NOT DETECTED Final   Candida krusei NOT DETECTED NOT DETECTED Final   Candida parapsilosis NOT DETECTED NOT DETECTED Final   Candida tropicalis NOT DETECTED NOT DETECTED Final    Comment: Performed at [facility], [place] [address], [place], [place] [address]  Blood culture (routine x 2)     Status: None   Collection Time: 02/23/19  9:58 AM   Specimen: BLOOD  Result Value Ref Range Status   Specimen Description BLOOD SITE NOT SPECIFIED  Final   Special Requests   Final    BOTTLES DRAWN AEROBIC AND ANAEROBIC Blood Culture results may not be optimal due to an inadequate volume of blood received in culture bottles   Culture   Final    NO GROWTH 5 DAYS Performed at [facility], [place] [address] [place], [place] [address]    Report Status 02/28/2019 FINAL  Final  SARS Coronavirus 2 [facility] order, Performed in [facility] hospital lab) Nasopharyngeal Nasopharyngeal Swab     Status: None   Collection Time: 02/23/19 10:01 AM   Specimen: Nasopharyngeal Swab  Result Value Ref Range Status   SARS Coronavirus 2 NEGATIVE NEGATIVE Final    Comment: (NOTE) If result is NEGATIVE SARS-CoV-2 target nucleic acids are NOT DETECTED. The SARS-CoV-2 RNA is generally detectable in upper and lower  respiratory specimens during the acute phase of infection. The lowest  concentration of SARS-CoV-2 viral copies this assay can detect is 250  copies / mL. A negative result does not preclude SARS-CoV-2 infection  and should not be used as the sole basis for treatment or other  patient management decisions.  A negative result may occur with  improper specimen collection / handling, submission of specimen other  than nasopharyngeal swab, presence of viral mutation(s) within the  areas targeted by this assay, and inadequate number of viral copies  (<250 copies / mL).  A negative result must be combined with clinical  observations, patient history, and epidemiological information. If result is POSITIVE SARS-CoV-2 target nucleic acids are DETECTED. The SARS-CoV-2 RNA is generally detectable in upper and lower  respiratory specimens dur ing the acute phase of infection.  Positive  results are indicative of active infection with SARS-CoV-2.  Clinical  correlation with patient history and other diagnostic information is  necessary to determine patient infection status.  Positive results do  not rule out bacterial infection or co-infection with other viruses. If result is PRESUMPTIVE POSTIVE SARS-CoV-2 nucleic acids MAY BE PRESENT.   A presumptive positive result was obtained on the submitted specimen  and confirmed on repeat testing.  While 2019 novel coronavirus  (SARS-CoV-2) nucleic acids may be present in the submitted sample  additional confirmatory testing may be necessary for epidemiological  and / or clinical management purposes  to differentiate between  SARS-CoV-2 and other Sarbecovirus currently known to infect humans.  If clinically indicated additional testing with an alternate test  methodology [phone]) is advised. The SARS-CoV-2 RNA is generally  detectable in upper and lower respiratory sp ecimens during the acute  phase of infection. The expected result is Negative. Fact Sheet for Patients:  StrictlyIdeas.no Fact Sheet for Healthcare Providers: BankingDealers.co.za This test is not yet approved or cleared by the [place] FDA and has been authorized for detection and/or diagnosis of SARS-CoV-2 by FDA under an Emergency Use Authorization (EUA).  This EUA will remain in effect (meaning this test can be used) for the duration of the COVID-19 declaration under Section 564(b)(1) of the Act, 21 U.S.C. section 360bbb-3(b)(1), unless the authorization is terminated or revoked sooner.  Performed at [facility], [place] [address] [place], [place] [address]   Urine culture     Status: Abnormal   Collection Time: 02/23/19 10:57 AM   Specimen: Urine, Random  Result Value Ref Range Status   Specimen Description URINE, RANDOM  Final   Special Requests   Final    NONE Performed at [facility], [place] [address] [place], [place]  [address]    Culture >=100,000 COLONIES/mL ENTEROCOCCUS FAECALIS (A)  Final   Report Status 02/25/2019 FINAL  Final   Organism ID, Bacteria ENTEROCOCCUS FAECALIS (A)  Final      Susceptibility   Enterococcus faecalis - MIC*    AMPICILLIN <=2 SENSITIVE Sensitive     LEVOFLOXACIN >=8 RESISTANT Resistant     NITROFURANTOIN 64 INTERMEDIATE Intermediate     VANCOMYCIN 1 SENSITIVE Sensitive     * >=100,000 COLONIES/mL ENTEROCOCCUS FAECALIS    Radiology Studies: No results found.   25 minutes with more than 50% spent in reviewing records, counseling patient and coordinating care.  Kendrew Paci T. Potter  If 7PM-7AM, please contact night-coverage [email or web address] Password [email or web address] 03/01/2019, 4:53 PM

## 2019-03-01 NOTE — Plan of Care (Signed)

## 2019-03-02 LAB — GLUCOSE, CAPILLARY
Glucose-Capillary: 81 mg/dL (ref 70–99)
Glucose-Capillary: 104 mg/dL — ABNORMAL HIGH (ref 70–99)
Glucose-Capillary: 197 mg/dL — ABNORMAL HIGH (ref 70–99)
Glucose-Capillary: 132 mg/dL — ABNORMAL HIGH (ref 70–99)

## 2019-03-02 LAB — CREATININE, SERUM
Creatinine, Ser: 0.7 mg/dL (ref 0.61–1.24)
GFR calc Af Amer: >60 mL/min (ref >60)
GFR calc non Af Amer: >60 mL/min (ref >60)

## 2019-03-02 NOTE — Progress Notes (Signed)
Physical Therapy Treatment Patient Details Name: [name] MRN: [id number] DOB: 1940/07/23 Today's Date: 03/02/2019    History of Present Illness Pt is a 78 y.o. M who presents from home with generalized weakness and recent UTI. Of note, recent admission 02/08/19 with fall sustaining L3 compression fx; pt discharged home with aspen lumbar corset. Other PMH includes DM 2, HTN, lymphoma in remission, A. fib not on antiocoagulation, stroke with right side weakness.   PT Comments    Pt declining any out of bed mobility since yesterday saying, "My back is broken, I cannot move until it heals..." Max education on current condition, precautions and importance of mobility. Pt only agreeable to supine BLE therex. Increased time discussing d/c recommendations and DME needs. Pt adamant against SNF due to bad experience; plans to d/c to daughter's home. Pt will require hoyer lift as daughter unable to provide necessary physical assist to stand/transfer; pt in agreement with need for this and BSC. Will already have hospital bed, w/c, RW. Pt states he will d/c tomorrow.   Follow Up Recommendations  Home health PT;Supervision/Assistance - 24 hour (declined SNF; will not tolerate CIR)     Equipment Recommendations  3in1 (PT);Other (comment)(hoyer lift)    Recommendations for Other Services       Precautions / Restrictions Precautions Precautions: Fall;Back Precaution Comments: verbally reviewed precautions Required Braces or Orthoses: Spinal Brace Spinal Brace: Thoracolumbosacral orthotic;Applied in sitting position Restrictions Weight Bearing Restrictions: No    Mobility  Bed Mobility               General bed mobility comments: Able to roll with use of bed rail mod indep; adamantly declining OOB mobility despite max encouragement/education  Transfers                    Ambulation/Gait                 Stairs             Wheelchair Mobility    Modified  Rankin (Stroke Patients Only)       Balance                                            Cognition Arousal/Alertness: Awake/alert Behavior During Therapy: WFL for tasks assessed/performed Overall Cognitive Status: Within Functional Limits for tasks assessed                                 General Comments: WFL for simple tasks, although poor insight into condition and difficult to reason with. Max educ on importance of OOB mobility, but pt continues to state he cannot due anything because "my back is broken... I'm not getting up until the pain is gone and it's healed"      Exercises General Exercises - Lower Extremity Ankle Circles/Pumps: AROM;Both;Supine Long Arc Quad: AROM;Both;Supine Heel Slides: AROM;Both;Supine Hip ABduction/ADduction: AROM;Right;Supine Straight Leg Raises: AROM;Both;Supine    General Comments General comments (skin integrity, edema, etc.): Increased time discussing d/c recommendation and DME needs. Pt adamantly declining SNF due to previous bad experience with himself and wife. Plans to return home with daughter's assist; pt recognizes that daughter cannot provide physical assist to help him stand or walk, therefore will need hoyer lift and BSC. Pt states daughter able to assist with ADL  set-up and tasks, including meals.      Pertinent Vitals/Pain Pain Assessment: Faces Faces Pain Scale: Hurts a little bit Pain Location: Back Pain Descriptors / Indicators: Constant Pain Intervention(s): Limited activity within patient's tolerance    Home Living                      Prior Function            PT Goals (current goals can now be found in the care plan section) Acute Rehab PT Goals Patient Stated Goal: to go to my dtrs home and get better PT Goal Formulation: With patient Time For Goal Achievement: 03/10/19 Potential to Achieve Goals: Fair Progress towards PT goals: Progressing toward goals    Frequency     Min 3X/week      PT Plan Current plan remains appropriate    Co-evaluation              AM-PAC PT "6 Clicks" Mobility   Outcome Measure  Help needed turning from your back to your side while in a flat bed without using bedrails?: None Help needed moving from lying on your back to sitting on the side of a flat bed without using bedrails?: A Little Help needed moving to and from a bed to a chair (including a wheelchair)?: A Little Help needed standing up from a chair using your arms (e.g., wheelchair or bedside chair)?: A Lot Help needed to walk in hospital room?: A Lot Help needed climbing 3-5 steps with a railing? : A Lot 6 Click Score: 16    End of Session   Activity Tolerance: Patient limited by pain Patient left: in bed;with call bell/phone within reach;with bed alarm set Nurse Communication: Mobility status PT Visit Diagnosis: Unsteadiness on feet (R26.81);History of falling (Z91.81);Difficulty in walking, not elsewhere classified (R26.2)     Time: [id number] PT Time Calculation (min) (ACUTE ONLY): 20 min  Charges:  $Therapeutic Exercise: 8-22 mins                     Mabeline Caras, PT, DPT Acute Rehabilitation Services  Pager [phone] Office [place] 03/02/2019, 11:53 AM

## 2019-03-02 NOTE — Progress Notes (Signed)
Occupational Therapy Treatment Patient Details Name: [name] MRN: [id number] DOB: 01-31-1941 Today's Date: 03/02/2019    History of present illness Pt is a 78 y.o. M who presents from home with generalized weakness and recent UTI. Of note, recent admission 02/08/19 with fall sustaining L3 compression fx; pt discharged home with aspen lumbar corset. Other PMH includes DM 2, HTN, lymphoma in remission, A. fib not on antiocoagulation, stroke with right side weakness.   OT comments  Pt making slow progress towards OT goals this session. Pt adamant about not wanting to get OOB despite extensive education on benefits of advancing functional mobility. Pt agreeable to demonstrate log roll technique in bed MOD I with use of rails.  Pt found to have incontinent BM during rolling. Offered to get pt OOB for thorough cleaning- pt declining. Pt unable to recall any back precautions this session. Plan to DC home with daughter remains appropriate with necessary DME. Will continue to follow for acute OT needs.   Follow Up Recommendations  Home health OT;Supervision/Assistance - 24 hour    Equipment Recommendations  None recommended by OT    Recommendations for Other Services      Precautions / Restrictions Precautions Precautions: Fall;Back Precaution Comments: verbally reviewed precautions Required Braces or Orthoses: Spinal Brace Spinal Brace: Thoracolumbosacral orthotic;Applied in sitting position Restrictions Weight Bearing Restrictions: No       Mobility Bed Mobility Overal bed mobility: Needs Assistance Bed Mobility: Rolling Rolling: Modified independent (Device/Increase time)         General bed mobility comments: able to roll with use of bed rails; would not agree to OOB or using log roll technique, states, "this is the way I do it"  Transfers                 General transfer comment: decllined OOB transfer    Balance                                           ADL either performed or assessed with clinical judgement   ADL Overall ADL's : Needs assistance/impaired                             Toileting- Clothing Manipulation and Hygiene: Maximal assistance;Adhering to back precautions Toileting - Clothing Manipulation Details (indicate cue type and reason): pt with incontinet BM during session;pt able to roll; MAX A for clean up     Functional mobility during ADLs: Maximal assistance General ADL Comments: pt declined any OOB ADLs; states his back is broken and he can't up; spent extensive time educating pt on OOB participation     Vision       Perception     Praxis      Cognition Arousal/Alertness: Awake/alert Behavior During Therapy: WFL for tasks assessed/performed Overall Cognitive Status: Within Functional Limits for tasks assessed                                 General Comments: extremely poor insight into deficits; states he has to lay still because he has a broken back; education provided but pt starts to get agitated quickly        Exercises General Exercises - Lower Extremity Ankle Circles/Pumps: AROM;Both;Supine Long Arc Quad: AROM;Both;Supine Heel Slides: AROM;Both;Supine Hip  ABduction/ADduction: AROM;Right;Supine Straight Leg Raises: AROM;Both;Supine   Shoulder Instructions       General Comments increased time discussing how he will use BSC at home if he will not agree to get OOB and practice with staff;  pt quickly angers    Pertinent Vitals/ Pain       Pain Assessment: Faces Faces Pain Scale: Hurts a little bit Pain Location: Back Pain Descriptors / Indicators: Moaning;Discomfort Pain Intervention(s): Monitored during session;Limited activity within patient's tolerance;Repositioned  Home Living                                          Prior Functioning/Environment              Frequency  Min 2X/week        Progress Toward Goals  OT  Goals(current goals can now be found in the care plan section)     Acute Rehab OT Goals Patient Stated Goal: to go to my dtrs home and get better Time For Goal Achievement: 03/14/19 Potential to Achieve Goals: Good  Plan      Co-evaluation                 AM-PAC OT "6 Clicks" Daily Activity     Outcome Measure   Help from another person eating meals?: None Help from another person taking care of personal grooming?: A Lot Help from another person toileting, which includes using toliet, bedpan, or urinal?: A Lot Help from another person bathing (including washing, rinsing, drying)?: A Lot Help from another person to put on and taking off regular upper body clothing?: A Lot Help from another person to put on and taking off regular lower body clothing?: A Lot 6 Click Score: 14    End of Session    OT Visit Diagnosis: Other abnormalities of gait and mobility (R26.89);Unsteadiness on feet (R26.81);Pain   Activity Tolerance Patient limited by pain   Patient Left in chair;with call bell/phone within reach;Other (comment)(RN present cleaning up incontient BM)   Nurse Communication          [id number] OT Time Calculation (min): 28 min  Charges: OT General Charges $OT Visit: 1 Visit OT Treatments $Self Care/Home Management : 23-37 mins  [place], [place] [phone] [place] 03/02/2019, 1:48 PM

## 2019-03-02 NOTE — Plan of Care (Signed)

## 2019-03-02 NOTE — TOC Transition Note (Addendum)
Transition of Care [facility]) - CM/SW Discharge Note   Patient Details  Name: [name] MRN: [id number] Date of Birth: 13-Dec-1940  [facility]) CM/SW Contact:  Midge Minium RN, BSN, NCM-BC, ACM-RN [phone] Phone Number: 03/02/2019, 12:46 PM   Clinical Narrative:    CM spoke to the patient to discuss DME needs for a hoyer lift/BSC. CMS DME preference provided with [employer] selected. DME referral given to Learta Codding [facility] liaison); AVS updated. HH was previously arranged by the [facility] with [facility]. CSW will arrange ambulance transportation.   Addendum: 03/02/19 @ 1336-[name] RNCM- CM informed [employer] doesn't have the hoyer lift in stock until Monday. DME referral given to [facility], with the referral discussed with Arnetha Massy; AVS updated.   Addendum: 03/03/19 @ 1256-[name] RNCM- CM spoke to the [facility] rep; patient declined the hoyer lift d/t the strap will be pressing against his back. CM spoke to the patient who confirmed he declined the hoyer lift. CM informed the patient he will have a HHPT/OT that will eval for additional home needs and DME, with the patient verbalizing understanding. The CSW will arrange PTAR.   Final next level of care: [place] Barriers to Discharge: No Barriers Identified   Patient Goals and CMS Choice Patient states their goals for this hospitalization and ongoing recovery are:: to get back home CMS Medicare.gov Compare Post Acute Care list provided to:: Patient Choice offered to / list presented to : Adult Children   Discharge Plan and Services          DME Arranged: Bedside commode(hoyer lift) DME Agency: [place] Date DME Agency Contacted: 03/02/19 Time DME Agency Contacted: [phone] Representative spoke with at DME Agency: Learta Codding (liaison) [place] Arranged: PT, OT, Nurse's Aide, Social Work [employer] Agency: [place] ([place]) Date [place]: 02/25/19 Time [place]: 1450 Representative spoke with at [place]: Floydene Flock  Social Determinants of Health (SDOH) Interventions     Readmission Risk Interventions No flowsheet data found.

## 2019-03-02 NOTE — Plan of Care (Signed)

## 2019-03-02 NOTE — Progress Notes (Signed)
PROGRESS NOTE  [name] [id number] DOB: July 03, 1940   PCP: Alroy Dust, L.Marlou Sa, MD  Patient is from: Home  DOA: 02/23/2019 LOS: 7  Brief Narrative / Interim history: 78 y.o.malewith medical history significant ofB-cell lymphoma, hypertension, diabetes, atrial fibrillation and previous CVA who came to the ER with generalized weakness and unable to walk. Patient has had progressive weakness for the last week. He was recently diagnosed with UTI and treated with IV Rocephin. Patient has not fully recovered since then. He has continued to be weak and debilitated.   Subjective: No major events overnight of this morning.  Pain fairly controlled with high-dose Tylenol.  He denies chest pain, dyspnea, GI or GU issues.  Objective: Vitals:   03/01/19 1415 03/01/19 2006 03/02/19 0345 03/02/19 0943  BP: (!) 140/55 (!) 148/58 135/61 (!) 138/56  Pulse: 71 79 66 66  Resp: 16 18 16 16   Temp: 98.6 F (37 C) 98.9 F (37.2 C) 98.2 F (36.8 C) 98 F (36.7 C)  TempSrc: Oral Oral Oral Oral  SpO2: 98% 98% 97% 97%  Weight:      Height:        Intake/Output Summary (Last 24 hours) at 03/02/2019 1327 Last data filed at 03/01/2019 1700 Gross per 24 hour  Intake 360 ml  Output -  Net 360 ml   Filed Weights   02/23/19 0957  Weight: 78 kg    Examination:  GENERAL: No acute distress.  Appears well.  HEENT: MMM.  Vision and hearing grossly intact.  NECK: Supple.  No apparent JVD.  RESP:  No IWOB. Good air movement bilaterally. CVS:  RRR. Heart sounds normal.  ABD/GI/GU: Bowel sounds present. Soft. Non tender.  MSK/EXT:  Moves extremities. No apparent deformity or edema.  SKIN: Mild ecchymosis over his left hip and left forearm. NEURO: Awake, alert and oriented appropriately.  B/l LE paresthesia.  Motor 4/5 in RLE, 5/5 elsewhere. PSYCH: Calm. Normal affect.   Assessment & Plan: Sepsis due to enterococcus UTI / Aerococcus bacteremia in aerobic bottle -Sepsis physiology resolved.  -Allergic to penicillin. -Rocephin>>vancomycin on 9/4-9/11-plan to complete 7 days course per ID  Recurrent falls /recent L3 compression fracture -Evaluated by NS, Dr. Vertell Limber during his previous hospitalization on 8/20 -TLSO brace when patient is up for stability, pain mgt and outpatient follow-up with x-rays in 1 month. -PT recommended home health PT  Hypertension: Normotensive. -Continue Norvasc, lisinopril and metoprolol,  Hyperlipidemia -Continue statin   Type 2 diabetes mellitus: Controlled. -Continue Lantus/sliding scale, CBGs stable CBG (last 3)  Recent Labs    03/01/19 2132 03/02/19 0659 03/02/19 1144  GLUCAP 146* 81 104*   History of non-Hodgkin's lymphoma status post chemotherapy and radiation -Outpatient follow-up.  History of urethral strictures with recurrent UTIs -On antibiotics as above -Outpatient urology follow-up  Paroxysmal A. fib: Unclear about this diagnosis.  His EKG back to 2015 did not reveal A. fib.  He is on metoprolol.  Not on anticoagulation.  But on Plavix and aspirin for CVA. -Will continue home medication now -Encourage clarification with his PCP about the diagnosis of A. fib.  History of CVA: Residual right lower extremity weakness. -Continue Plavix, aspirin  DVT prophylaxis: Subcu Lovenox. Code Status: Full code Family Communication: Patient and/or RN. Available if any question. Disposition Plan: Remains inpatient to complete IV vancomycin.  Anticipate discharge on 9/11 Consultants: ID  Procedures:  None  Microbiology summarized: 9/3-blood culture Aerococcus urinae in aerobic bottles 9/3-urine culture Enterococcus faecalis resistant to Levaquin and nitrofurantoin 9/3-COVID-19 negative  Antimicrobials: Anti-infectives (From admission, onward)   Start     Dose/Rate Route Frequency Ordered Stop   02/24/19 1600  vancomycin (VANCOCIN) IVPB 1000 mg/200 mL premix     1,000 mg 200 mL/hr over 60 Minutes Intravenous Every 12 hours  02/24/19 1534     02/24/19 0000  cefTRIAXone (ROCEPHIN) 1 g in sodium chloride 0.9 % 100 mL IVPB  Status:  Discontinued     1 g 200 mL/hr over 30 Minutes Intravenous Every 24 hours 02/23/19 2253 02/25/19 1109   02/23/19 0930  cefTRIAXone (ROCEPHIN) 1 g in sodium chloride 0.9 % 100 mL IVPB     1 g 200 mL/hr over 30 Minutes Intravenous  Once 02/23/19 0921 02/23/19 1026      Sch Meds:  Scheduled Meds: . amLODipine  5 mg Oral BID  . aspirin EC  81 mg Oral Daily  . clopidogrel  75 mg Oral Daily  . enoxaparin (LOVENOX) injection  40 mg Subcutaneous Q24H  . insulin aspart  0-5 Units Subcutaneous QHS  . insulin aspart  0-9 Units Subcutaneous TID WC  . insulin glargine  18 Units Subcutaneous Daily  . lisinopril  10 mg Oral Daily  . metoprolol tartrate  25 mg Oral BID  . simvastatin  20 mg Oral QHS  . sodium chloride flush  10-40 mL Intracatheter Q12H   Continuous Infusions: . vancomycin 1,000 mg (03/02/19 0520)   PRN Meds:.acetaminophen, HYDROcodone-acetaminophen, ondansetron **OR** ondansetron (ZOFRAN) IV, sodium chloride flush   I have personally reviewed the following labs and images: CBC: Recent Labs  Lab 02/24/19 0410 02/25/19 0434 02/28/19 0410  WBC 14.0* 12.5* 11.8*  HGB 14.1 12.4* 12.7*  HCT 42.3 37.3* 37.9*  MCV 90.2 90.8 91.3  PLT 189 170 227   BMP &GFR Recent Labs  Lab 02/24/19 0410 02/25/19 0434 02/27/19 0404 02/28/19 0410 03/02/19 0338  NA 137 138 136 139  --   K 3.8 3.7 3.4* 3.7  --   CL 105 107 102 106  --   CO2 22 22 25 26   --   GLUCOSE 135* 84 76 67*  --   BUN 29* 21 16 21   --   CREATININE 0.77 0.65 0.72 0.77 0.70  CALCIUM 8.2* 7.9* 7.7* 8.1*  --    Estimated Creatinine Clearance: 77.3 mL/min (by C-G formula based on SCr of 0.7 mg/dL). Liver & Pancreas: Recent Labs  Lab 02/24/19 0410  AST 26  ALT 19  ALKPHOS 79  BILITOT 0.7  PROT 5.8*  ALBUMIN 2.3*   No results for input(s): LIPASE, AMYLASE in the last 168 hours. No results for  input(s): AMMONIA in the last 168 hours. Diabetic: No results for input(s): HGBA1C in the last 72 hours. Recent Labs  Lab 03/01/19 1139 03/01/19 1643 03/01/19 2132 03/02/19 0659 03/02/19 1144  GLUCAP 98 138* 146* 81 104*   Cardiac Enzymes: No results for input(s): CKTOTAL, CKMB, CKMBINDEX, TROPONINI in the last 168 hours. No results for input(s): PROBNP in the last 8760 hours. Coagulation Profile: No results for input(s): INR, PROTIME in the last 168 hours. Thyroid Function Tests: No results for input(s): TSH, T4TOTAL, FREET4, T3FREE, THYROIDAB in the last 72 hours. Lipid Profile: No results for input(s): CHOL, HDL, LDLCALC, TRIG, CHOLHDL, LDLDIRECT in the last 72 hours. Anemia Panel: No results for input(s): VITAMINB12, FOLATE, FERRITIN, TIBC, IRON, RETICCTPCT in the last 72 hours. Urine analysis:    Component Value Date/Time   COLORURINE YELLOW 02/23/2019 [place] 02/23/2019 1057  LABSPEC 1.035 (H) 02/23/2019 1057   PHURINE 6.0 02/23/2019 1057   GLUCOSEU NEGATIVE 02/23/2019 1057   HGBUR SMALL (A) 02/23/2019 1057   BILIRUBINUR NEGATIVE 02/23/2019 1057   KETONESUR NEGATIVE 02/23/2019 1057   PROTEINUR NEGATIVE 02/23/2019 1057   UROBILINOGEN 0.2 05/29/2014 2125   NITRITE NEGATIVE 02/23/2019 1057   LEUKOCYTESUR TRACE (A) 02/23/2019 1057   Sepsis Labs: Invalid input(s): PROCALCITONIN, [place]  Microbiology: Recent Results (from the past 240 hour(s))  Blood culture (routine x 2)     Status: Abnormal   Collection Time: 02/23/19  9:52 AM   Specimen: BLOOD  Result Value Ref Range Status   Specimen Description BLOOD BLOOD LEFT FOREARM  Final   Special Requests   Final    BOTTLES DRAWN AEROBIC AND ANAEROBIC Blood Culture results may not be optimal due to an inadequate volume of blood received in culture bottles   Culture  Setup Time   Final    GRAM POSITIVE COCCI AEROBIC BOTTLE ONLY CRITICAL RESULT CALLED TO, READ BACK BY AND VERIFIED WITH: PHARMD  [place]o [id number] FCP    Culture (A)  Final    AEROCOCCUS URINAE Standardized susceptibility testing for this organism is not available. Performed at [facility], [place] [address], [place], [place] [address]    Report Status 02/27/2019 FINAL  Final  Blood Culture ID Panel (Reflexed)     Status: None   Collection Time: 02/23/19  9:52 AM  Result Value Ref Range Status   Enterococcus species NOT DETECTED NOT DETECTED Final   Listeria monocytogenes NOT DETECTED NOT DETECTED Final   Staphylococcus species NOT DETECTED NOT DETECTED Final   Staphylococcus aureus (BCID) NOT DETECTED NOT DETECTED Final   Streptococcus species NOT DETECTED NOT DETECTED Final   Streptococcus agalactiae NOT DETECTED NOT DETECTED Final   Streptococcus pneumoniae NOT DETECTED NOT DETECTED Final   Streptococcus pyogenes NOT DETECTED NOT DETECTED Final   Acinetobacter baumannii NOT DETECTED NOT DETECTED Final   Enterobacteriaceae species NOT DETECTED NOT DETECTED Final   Enterobacter cloacae complex NOT DETECTED NOT DETECTED Final   Escherichia coli NOT DETECTED NOT DETECTED Final   Klebsiella oxytoca NOT DETECTED NOT DETECTED Final   Klebsiella pneumoniae NOT DETECTED NOT DETECTED Final   Proteus species NOT DETECTED NOT DETECTED Final   Serratia marcescens NOT DETECTED NOT DETECTED Final   Haemophilus influenzae NOT DETECTED NOT DETECTED Final   Neisseria meningitidis NOT DETECTED NOT DETECTED Final   Pseudomonas aeruginosa NOT DETECTED NOT DETECTED Final   Candida albicans NOT DETECTED NOT DETECTED Final   Candida glabrata NOT DETECTED NOT DETECTED Final   Candida krusei NOT DETECTED NOT DETECTED Final   Candida parapsilosis NOT DETECTED NOT DETECTED Final   Candida tropicalis NOT DETECTED NOT DETECTED Final    Comment: Performed at [facility], [place] [address], [place], [place] [address]  Blood culture (routine x 2)     Status: None   Collection Time: 02/23/19  9:58 AM   Specimen:  BLOOD  Result Value Ref Range Status   Specimen Description BLOOD SITE NOT SPECIFIED  Final   Special Requests   Final    BOTTLES DRAWN AEROBIC AND ANAEROBIC Blood Culture results may not be optimal due to an inadequate volume of blood received in culture bottles   Culture   Final    NO GROWTH 5 DAYS Performed at [facility], [place] [address], [place],  [address]    Report Status 02/28/2019 FINAL  Final  SARS Coronavirus 2 [facility] order, Performed in [facility] hospital lab) Nasopharyngeal Nasopharyngeal Swab     Status: None   Collection Time: 02/23/19 10:01 AM   Specimen: Nasopharyngeal Swab  Result Value Ref Range Status   SARS Coronavirus 2 NEGATIVE NEGATIVE Final    Comment: (NOTE) If result is NEGATIVE SARS-CoV-2 target nucleic acids are NOT DETECTED. The SARS-CoV-2 RNA is generally detectable in upper and lower  respiratory specimens during the acute phase of infection. The lowest  concentration of SARS-CoV-2 viral copies this assay can detect is 250  copies / mL. A negative result does not preclude SARS-CoV-2 infection  and should not be used as the sole basis for treatment or other  patient management decisions.  A negative result may occur with  improper specimen collection / handling, submission of specimen other  than nasopharyngeal swab, presence of viral mutation(s) within the  areas targeted by this assay, and inadequate number of viral copies  (<250 copies / mL). A negative result must be combined with clinical  observations, patient history, and epidemiological information. If result is POSITIVE SARS-CoV-2 target nucleic acids are DETECTED. The SARS-CoV-2 RNA is generally detectable in upper and lower  respiratory specimens dur ing the acute phase of infection.  Positive  results are indicative of active infection with SARS-CoV-2.  Clinical  correlation with patient history and other diagnostic information is  necessary to determine patient  infection status.  Positive results do  not rule out bacterial infection or co-infection with other viruses. If result is PRESUMPTIVE POSTIVE SARS-CoV-2 nucleic acids MAY BE PRESENT.   A presumptive positive result was obtained on the submitted specimen  and confirmed on repeat testing.  While 2019 novel coronavirus  (SARS-CoV-2) nucleic acids may be present in the submitted sample  additional confirmatory testing may be necessary for epidemiological  and / or clinical management purposes  to differentiate between  SARS-CoV-2 and other Sarbecovirus currently known to infect humans.  If clinically indicated additional testing with an alternate test  methodology [phone]) is advised. The SARS-CoV-2 RNA is generally  detectable in upper and lower respiratory sp ecimens during the acute  phase of infection. The expected result is Negative. Fact Sheet for Patients:  StrictlyIdeas.no Fact Sheet for Healthcare Providers: BankingDealers.co.za This test is not yet approved or cleared by the [place] FDA and has been authorized for detection and/or diagnosis of SARS-CoV-2 by FDA under an Emergency Use Authorization (EUA).  This EUA will remain in effect (meaning this test can be used) for the duration of the COVID-19 declaration under Section 564(b)(1) of the Act, 21 U.S.C. section 360bbb-3(b)(1), unless the authorization is terminated or revoked sooner. Performed at [facility], [place] [address] [place], [place] [address]   Urine culture     Status: Abnormal   Collection Time: 02/23/19 10:57 AM   Specimen: Urine, Random  Result Value Ref Range Status   Specimen Description URINE, RANDOM  Final   Special Requests   Final    NONE Performed at [facility], [place] [address], [place], [place] [address]    Culture >=100,000 COLONIES/mL ENTEROCOCCUS FAECALIS (A)  Final   Report Status 02/25/2019 FINAL  Final   Organism ID, Bacteria  ENTEROCOCCUS FAECALIS (A)  Final      Susceptibility   Enterococcus faecalis - MIC*    AMPICILLIN <=2 SENSITIVE Sensitive     LEVOFLOXACIN >=8 RESISTANT Resistant     NITROFURANTOIN 64 INTERMEDIATE Intermediate     VANCOMYCIN  1 SENSITIVE Sensitive     * >=100,000 COLONIES/mL ENTEROCOCCUS FAECALIS    Radiology Studies: No results found.  Letisha Yera T. [place]  If 7PM-7AM, please contact night-coverage [email or web address] Password [email or web address] 03/02/2019, 1:27 PM

## 2019-03-02 NOTE — Progress Notes (Signed)
Pt refused mobility and stated he does not want to get out of bed today , added that moving around causes pain on his back. PT/OT and RN educated Pt regarding benefits of mobility but he refused PT and OT sessions.

## 2019-03-03 DIAGNOSIS — C8510 Unspecified B-cell lymphoma, unspecified site: Secondary | ICD-10-CM

## 2019-03-03 LAB — GLUCOSE, CAPILLARY
Glucose-Capillary: 89 mg/dL (ref 70–99)
Glucose-Capillary: 102 mg/dL — ABNORMAL HIGH (ref 70–99)

## 2019-03-03 NOTE — TOC Transition Note (Signed)
Transition of Care [facility]) - CM/SW Discharge Note   Patient Details  Name: [name] MRN: [id number] Date of Birth: Nov 16, 1940  Transition of [facility]) CM/SW Contact:  Gelene Mink, [place] Phone Number: 03/03/2019, 1:05 PM   Clinical Narrative:     CSW has spoken with RNCM, Lanelle Bal. She stated that the patient is set up to go home with [place]. The patient is no longer requiring a hoyer lift.   CSW called and spoke with the patient's daughter, CSW confirmed her address. Her address is [address] [place], [place], [address].   CSW called and arranged transportation with [facility]. Transportation has been scheduled for 2:00pm.   Final next level of care: [place] Barriers to Discharge: No Barriers Identified   Patient Goals and CMS Choice Patient states their goals for this hospitalization and ongoing recovery are:: Pt will go to daughter's home to have home health services CMS Medicare.gov Compare Post Acute Care list provided to:: Patient Choice offered to / list presented to : Patient  Discharge Placement                Patient to be transferred to facility by: [place] Name of family member notified: [name], Daughter Patient and family notified of of transfer: 03/03/19  Discharge Plan and Services                DME Arranged: Bedside commode(hoyer lift) DME Agency: [place] Date DME Agency Contacted: 03/02/19 Time DME Agency Contacted: [phone] Representative spoke with at DME Agency: Learta Codding (liaison) [place] Arranged: PT, OT, Nurse's Aide, Social Work, Therapist, sports HH Agency: [place] ([place]) Date [place]: 03/02/19 Time [place]: 1303 Representative spoke with at [place]: [place] ([place]) Interventions     Readmission Risk Interventions No flowsheet data found.

## 2019-03-03 NOTE — Discharge Summary (Signed)
Physician Discharge Summary  [name] [id number] DOB: 04/04/1941 DOA: 02/23/2019  PCP: Alroy Dust, L.Marlou Sa, MD  Admit date: 02/23/2019 Discharge date: 03/03/2019  Admitted From: Home Disposition: Home  Recommendations for Outpatient Follow-up:  1. Follow up with PCP in 1-2 weeks 2. Please obtain CBC/BMP/Mag at follow up 3. Please follow up on the following pending results: None  Home Health: PT/OT/RN Equipment/Devices: 3 in 1 commode and Foyer lift  Discharge Condition: Stable CODE STATUS: Full code  Hospital Course: 78 y.o.malewith medical history significant ofB-cell lymphoma, hypertension, diabetes, atrial fibrillation and previous CVA who came to the ER with generalized weakness and unable to walk. Patient has had progressive weakness for the last week. He was recently diagnosed with UTI and treated with IV Rocephin. Patient has not fully recovered since then. He has continued to be weak and debilitated.   Patient was admitted with sepsis due to enterococcus UTI and Aerococcus bacteremia anaerobic bottles.  Treated with IV vancomycin for 7 days per ID recommendation.  Symptoms improved.  Evaluated by PT/OT who recommended SNF.  However, patient declined SNF and discharged home with home health PT/OT/RN and DME (3 in 1 commode and Foyer lift).  See individual problem list below for more on hospital course.  Discharge Diagnoses:  Sepsis due to enterococcus UTI / Aerococcus bacteremia in aerobic bottle -Sepsis physiology resolved. -Allergic to penicillin. -Rocephin>>vancomycin on 02/24/2019-03/03/2019 per ID recommendation  Recurrent falls /recent L3 compression fracture -Evaluated by NS, Dr. Vertell Limber during his previous hospitalization on 8/20 -TLSO brace when patient is up for stability, pain control and outpatient follow-up with x-rays in 1 month. -PT/OT recommended SNF but patient declined and discharged home with home health and DME.  Hypertension:  Normotensive. -Discharged on home Norvasc, lisinopril and metoprolol,  Hyperlipidemia -Continue statin   Type 2 diabetes mellitus: Controlled. -Discharged on home Tresiba.  History of non-Hodgkin's lymphoma status post chemotherapy and radiation -Outpatient follow-up.  History of urethral strictures with recurrent UTIs -On antibiotics as above -Outpatient urology follow-up  Paroxysmal A. fib: Unclear about this diagnosis.  His EKG back to 2015 did not reveal A. fib.  He is on metoprolol.  Not on anticoagulation.  But on Plavix and aspirin for CVA. -Discharged on home metoprolol. -Encourage clarification with his PCP about the diagnosis of A. fib.  History of CVA: Residual right lower extremity weakness. -Continue Plavix, aspirin  Discharge Instructions  Discharge Instructions    Call MD for:  difficulty breathing, headache or visual disturbances   Complete by: As directed    Call MD for:  persistant dizziness or light-headedness   Complete by: As directed    Call MD for:  persistant nausea and vomiting   Complete by: As directed    Call MD for:  severe uncontrolled pain   Complete by: As directed    Call MD for:  temperature >100.4   Complete by: As directed    Diet - low sodium heart healthy   Complete by: As directed    Diet Carb Modified   Complete by: As directed    Discharge instructions   Complete by: As directed    It has been a pleasure taking care of you! You were admitted with generalized weakness and diagnosed with urinary tract and bloodstream infection.  You were treated with IV antibiotics.  With that your symptoms improved,  Please review your new medication list and the directions before you take your medications. Please call your primary care office  as soon as  possible to schedule hospital follow-up visit in 1 to 2 weeks.  Take care,   Increase activity slowly   Complete by: As directed      Allergies as of 03/03/2019      Reactions    Prevnar [pneumococcal 13-val Conj Vacc] Anaphylaxis   Actos [pioglitazone Hydrochloride] Other (See Comments)   Afib   Hydrochlorothiazide Other (See Comments)   Lower potassium to low   Penicillins Rash   Tolerated cefepime, ceftriaxone Did it involve swelling of the face/tongue/throat, SOB, or low BP? No Did it involve sudden or severe rash/hives, skin peeling, or any reaction on the inside of your mouth or nose?Yes Did you need to seek medical attention at a hospital or doctor's office. Yes When did it last happen?78 years old If all above answers are NO, may proceed with cephalosporin use.      Medication List    TAKE these medications   acetaminophen 500 MG tablet Commonly known as: TYLENOL Take 500 mg by mouth every 6 (six) hours as needed for mild pain.   amLODipine 10 MG tablet Commonly known as: NORVASC Take 1 tablet (10 mg total) by mouth daily. What changed:   how much to take  when to take this   aspirin 81 MG EC tablet Take 1 tablet (81 mg total) by mouth daily. What changed:   when to take this  additional instructions   clopidogrel 75 MG tablet Commonly known as: PLAVIX Take 75 mg by mouth daily.   HYDROcodone-acetaminophen 5-325 MG tablet Commonly known as: NORCO/VICODIN Take 1 tablet by mouth every 6 (six) hours as needed for moderate pain.   lisinopril 2.5 MG tablet Commonly known as: ZESTRIL Take 3 tablets (7.5 mg total) by mouth daily. What changed:   how much to take  when to take this   metoprolol tartrate 25 MG tablet Commonly known as: LOPRESSOR Take 1 tablet (25 mg total) by mouth 2 (two) times daily. What changed: how much to take   simvastatin 20 MG tablet Commonly known as: ZOCOR Take 1 tablet (20 mg total) by mouth at bedtime.   Tresiba 100 UNIT/ML Soln Generic drug: Insulin Degludec Inject 18 Units into the skin daily.            Durable Medical Equipment  (From admission, onward)         Start      Ordered   03/02/19 1800  For home use only DME Bedside commode  Once    Question:  Patient needs a bedside commode to treat with the following condition  Answer:  Physical deconditioning   03/02/19 1759   03/02/19 1800  For home use only DME Other see comment  Once    Comments: Harrel Lemon Lift  Question:  Length of Need  Answer:  6 Months   03/02/19 1759         Follow-up Information    [facility], [facility]-Home Follow up.   Specialty: Home Health Services Why: Home Health physical and occupational therapy; nurse assistant; social worker       Supply, Family Medical Follow up.   Why: Stormy Fabian; Sports administrator information: [place] [id number] [phone]        Alroy Dust, L.Marlou Sa, MD. Schedule an appointment as soon as possible for a visit in 1 week(s).   Specialty: Family Medicine Contact information: [address] [place] [address] [phone]  Consultations:  Infectious disease  Procedures/Studies:  2D Echo: None  Ct Thoracic Spine Wo Contrast  Result Date: 02/08/2019 CLINICAL DATA:  78 year old male with fall and mid to lower back pain. EXAM: CT THORACIC AND LUMBAR SPINE WITHOUT CONTRAST TECHNIQUE: Multidetector CT imaging of the thoracic and lumbar spine was performed without contrast. Multiplanar CT image reconstructions were also generated. COMPARISON:  Chest radiograph dated 07/05/2017 and CT of the abdomen pelvis dated 10/07/2012. FINDINGS: CT THORACIC SPINE FINDINGS Alignment: Normal. Vertebrae: No acute fracture. Osteopenia. The posterior elements are intact. Paraspinal and other soft tissues: No paraspinal fluid collection or hematoma. There is atherosclerotic calcification of the visualized aorta. Coronary vascular calcifications noted. Disc levels: Multilevel degenerative changes. There is disc desiccation and vacuum phenomena primarily at T7-T10. CT LUMBAR SPINE FINDINGS Segmentation: 6  non-rib-bearing vertebra. Alignment: No acute subluxation. Vertebrae: There is nondisplaced fracture of the inferior endplate of the L3 (third non-rib-bearing vertebra). The bones are osteopenic. No other acute fracture identified. No retropulsed fragment. Paraspinal and other soft tissues: Atherosclerotic calcification of the aorta. There are multiple sigmoid diverticulosis. No paraspinal soft tissue swelling or fluid collection or hematoma. Disc levels: Multilevel degenerative disc disease. There is lower lumbar disc desiccation and vacuum phenomena. Multilevel facet arthropathy. IMPRESSION: 1. No acute/traumatic thoracic spine pathology. 2. Mild nondisplaced compression fracture of the inferior endplate of the third nonrib bearing lumbar spine vertebra. 3.  Aortic Atherosclerosis (ICD10-I70.0). Electronically Signed   By: Anner Crete M.D.   On: 02/08/2019 14:42   Ct Lumbar Spine Wo Contrast  Result Date: 02/08/2019 CLINICAL DATA:  78 year old male with fall and mid to lower back pain. EXAM: CT THORACIC AND LUMBAR SPINE WITHOUT CONTRAST TECHNIQUE: Multidetector CT imaging of the thoracic and lumbar spine was performed without contrast. Multiplanar CT image reconstructions were also generated. COMPARISON:  Chest radiograph dated 07/05/2017 and CT of the abdomen pelvis dated 10/07/2012. FINDINGS: CT THORACIC SPINE FINDINGS Alignment: Normal. Vertebrae: No acute fracture. Osteopenia. The posterior elements are intact. Paraspinal and other soft tissues: No paraspinal fluid collection or hematoma. There is atherosclerotic calcification of the visualized aorta. Coronary vascular calcifications noted. Disc levels: Multilevel degenerative changes. There is disc desiccation and vacuum phenomena primarily at T7-T10. CT LUMBAR SPINE FINDINGS Segmentation: 6 non-rib-bearing vertebra. Alignment: No acute subluxation. Vertebrae: There is nondisplaced fracture of the inferior endplate of the L3 (third non-rib-bearing  vertebra). The bones are osteopenic. No other acute fracture identified. No retropulsed fragment. Paraspinal and other soft tissues: Atherosclerotic calcification of the aorta. There are multiple sigmoid diverticulosis. No paraspinal soft tissue swelling or fluid collection or hematoma. Disc levels: Multilevel degenerative disc disease. There is lower lumbar disc desiccation and vacuum phenomena. Multilevel facet arthropathy. IMPRESSION: 1. No acute/traumatic thoracic spine pathology. 2. Mild nondisplaced compression fracture of the inferior endplate of the third nonrib bearing lumbar spine vertebra. 3.  Aortic Atherosclerosis (ICD10-I70.0). Electronically Signed   By: Anner Crete M.D.   On: 02/08/2019 14:42   Mr Lumbar Spine W Wo Contrast  Result Date: 02/23/2019 CLINICAL DATA:  Back pain.  Recent fall. EXAM: MRI LUMBAR SPINE WITHOUT AND WITH CONTRAST TECHNIQUE: Multiplanar and multiecho pulse sequences of the lumbar spine were obtained without and with intravenous contrast. CONTRAST:  7.5 mL Gadavist intravenous contrast. COMPARISON:  CT lumbar spine from same day. FINDINGS: Segmentation: Transitional lumbosacral anatomy again identified with fully lumbarized S1 segment, in keeping with numbering system used on prior studies. Alignment:  Physiologic. Vertebrae: Acute minimally depressed compression fractures of the L1 superior endplate  and L3 inferior endplate with associated marrow edema and enhancement. Additional acute nondisplaced fracture of the lower sacrum at the S4-S5 level. No evidence of discitis. No focal bone lesion. Conus medullaris and cauda equina: Conus extends to the L1 level. Conus and cauda equina appear normal. No intradural enhancement. Paraspinal and other soft tissues: Negative. Disc levels: T12-L1:  Negative. L1-L2:  Negative. L2-L3:  Negative. L3-L4:  Mild disc bulging.  No stenosis. L4-L5: Mild disc bulging with superimposed broad-based left foraminal disc protrusion encroaching  on the exiting left L4 nerve root. Mild bilateral facet arthropathy. Mild left neuroforaminal stenosis. No spinal canal or right neuroforaminal stenosis. L5-S1: Mild disc bulging, endplate spurring, and bilateral facet arthropathy. Mild to moderate right lateral recess stenosis. Mild left greater than right neuroforaminal stenosis. No spinal canal stenosis. S1-S2: Negative. IMPRESSION: 1. Acute minimally depressed compression fractures of the L1 superior endplate and L3 inferior endplate. No retropulsion. 2. Additional acute nondisplaced fracture of the lower sacrum. 3. Mild spondylosis at L4-L5 and L5-S1 as described above. Electronically Signed   By: Titus Dubin M.D.   On: 02/23/2019 15:11   Ct Abdomen Pelvis W Contrast  Result Date: 02/23/2019 CLINICAL DATA:  78 year old male with abdominal pain. EXAM: CT ABDOMEN AND PELVIS WITH CONTRAST TECHNIQUE: Multidetector CT imaging of the abdomen and pelvis was performed using the standard protocol following bolus administration of intravenous contrast. CONTRAST:  145mL OMNIPAQUE IOHEXOL 300 MG/ML  SOLN COMPARISON:  Lumbar spine CT dated 02/23/2019 and CT of the abdomen pelvis dated 10/07/2012. FINDINGS: Lower chest: Minimal bibasilar linear atelectasis/scarring. The visualized lung bases are clear. Advanced multi vessel coronary vascular calcification noted. No intra-abdominal free air or free fluid. Hepatobiliary: The liver is unremarkable. No intrahepatic biliary ductal dilatation. Multiple small stones noted within the gallbladder. No pericholecystic fluid or evidence of acute cholecystitis by CT. Ultrasound may provide better evaluation if there is clinical concern for acute cholecystitis. Pancreas: Unremarkable. No pancreatic ductal dilatation or surrounding inflammatory changes. Spleen: There is a 1 cm calcified splenic granuloma. Several additional splenic hypodense lesions are not characterized but may represent cysts or hemangioma. Adrenals/Urinary Tract:  The adrenal glands are unremarkable. There is no hydronephrosis on either side. There is symmetric enhancement and excretion of contrast by both kidneys. Bilateral renal cysts and smaller hypodense lesions which are too small to characterize. The largest cyst measures approximately 15 mm in the upper pole the right kidney. The visualized ureters appear unremarkable. There is trabeculated appearance of the bladder wall likely related to chronic bladder outlet obstruction. Stomach/Bowel: There is no bowel obstruction or active inflammation. The appendix is normal. Vascular/Lymphatic: Advanced aortoiliac atherosclerotic disease. The IVC is unremarkable. No portal venous gas. There is no adenopathy. Reproductive: Enlarged prostate gland measuring 5 cm in transverse axial diameter. Other: None Musculoskeletal: Osteopenia with degenerative changes of the spine. Fracture of the inferior endplate of the L3 extending to the anterior cortex as seen on the lumbar spine CT of table 02/08/2019. No new fracture. IMPRESSION: 1. No acute intra-abdominal or pelvic pathology. No bowel obstruction or active inflammation. Normal appendix. 2. Cholelithiasis. 3. L3 inferior endplate compression fracture. 4. Advanced aortic Atherosclerosis (ICD10-I70.0). Electronically Signed   By: Anner Crete M.D.   On: 02/23/2019 11:01   Ct L-spine No Charge  Result Date: 02/23/2019 CLINICAL DATA:  78 year old male with back pain. EXAM: CT LUMBAR SPINE WITHOUT CONTRAST TECHNIQUE: Multidetector CT imaging of the lumbar spine was performed without intravenous contrast administration. Multiplanar CT image reconstructions were also generated.  COMPARISON:  Lumbar spine CT dated 02/08/2019 and CT of the abdomen pelvis dated 02/23/2019 FINDINGS: Segmentation: 6 lumbar type vertebra. The lowermost vertebra is designated as a lumbarized S1. Alignment: No acute subluxation. Vertebrae: Fracture inferior endplate of the L3 extending into the anterior  vertebral body cortex. This has not significantly changed since the prior CT. No retropulsion. No other acute fracture. The bones are osteopenic. Paraspinal and other soft tissues: Negative. Disc levels: Multilevel degenerative disc disease. There is disc desiccation and vacuum phenomena at L4-L5 and L5-S1. The neural foramina remain patent. Mild diffuse disc bulge at L3-L4, L4-5, and L5-S1. IMPRESSION: 1. No new fracture. Fracture of the inferior endplate of L3 extending into the anterior vertebral body cortex, unchanged compared to the prior CT. No retropulsion. 2. Osteopenia with multilevel degenerative disc disease and mild diffuse disc bulge at L3-L4, L4-5, and L5-S1. Electronically Signed   By: Anner Crete M.D.   On: 02/23/2019 10:54   Dg Chest Port 1 View  Result Date: 02/23/2019 CLINICAL DATA:  Weakness. EXAM: PORTABLE CHEST 1 VIEW COMPARISON:  Radiograph of July 05, 2017. FINDINGS: The heart size and mediastinal contours are within normal limits. Atherosclerosis of thoracic aorta is noted. Hypoinflation of the lungs is noted with minimal bibasilar subsegmental atelectasis. No pneumothorax or pleural effusion is noted. The visualized skeletal structures are unremarkable. IMPRESSION: Hypoinflation of the lungs with minimal bibasilar subsegmental atelectasis. Aortic Atherosclerosis (ICD10-I70.0). Electronically Signed   By: Marijo Conception M.D.   On: 02/23/2019 09:50   US Abdomen Limited Ruq  Result Date: 02/23/2019 CLINICAL DATA:  Abdominal pain.  Gallstones on CT earlier this day. EXAM: ULTRASOUND ABDOMEN LIMITED RIGHT UPPER QUADRANT COMPARISON:  Abdominal CT earlier this day. FINDINGS: Gallbladder: Physiologically distended with intraluminal gallstone. Normal wall thickness of 2 mm. No pericholecystic fluid. No sonographic Murphy sign noted by sonographer. Common bile duct: Diameter: 6 mm, normal. Liver: No focal lesion identified. Within normal limits in parenchymal echogenicity. Portal vein  is patent on color Doppler imaging with normal direction of blood flow towards the liver. Other: None. IMPRESSION: Gallstones without sonographic findings of acute cholecystitis. No biliary dilatation. Electronically Signed   By: Keith Rake M.D.   On: 02/23/2019 19:27      Subjective: No major events overnight of this morning.  Had some pain in his back overnight that has improved with Tylenol.  Otherwise no complaints.  Denies chest pain, dyspnea, nausea, vomiting, abdominal pain or UTI symptoms.  Feels ready to go home and sleep on his own bed.  He says hospital bed is not good for his back pain.  Discharge Exam: Vitals:   03/02/19 1952 03/03/19 0357  BP: (!) 150/63 (!) 148/59  Pulse: 75 65  Resp: 18 16  Temp: 99 F (37.2 C) 98 F (36.7 C)  SpO2: 98% 97%    GENERAL: No acute distress.  Appears well.  HEENT: MMM.  Vision and hearing grossly intact.  NECK: Supple.  No apparent JVD.  LUNGS:  No IWOB. Good air movement bilaterally. HEART:  RRR. Heart sounds normal.  ABD: Bowel sounds present. Soft. Non tender.  MSK/EXT:  Moves all extremities. No apparent deformity. No edema bilaterally.  SKIN: no apparent skin lesion or wound NEURO: Awake, alert and oriented appropriately.  Paresthesia in both lower extremities below his knees.  Motor strength is 4/5 in right lower extremity.  5/5 elsewhere. PSYCH: Calm. Normal affect.    The results of significant diagnostics from this hospitalization (including imaging, microbiology, ancillary  and laboratory) are listed below for reference.     Microbiology: Recent Results (from the past 240 hour(s))  Blood culture (routine x 2)     Status: Abnormal   Collection Time: 02/23/19  9:52 AM   Specimen: BLOOD  Result Value Ref Range Status   Specimen Description BLOOD BLOOD LEFT FOREARM  Final   Special Requests   Final    BOTTLES DRAWN AEROBIC AND ANAEROBIC Blood Culture results may not be optimal due to an inadequate volume of blood  received in culture bottles   Culture  Setup Time   Final    GRAM POSITIVE COCCI AEROBIC BOTTLE ONLY CRITICAL RESULT CALLED TO, READ BACK BY AND VERIFIED WITH: PHARMD [place]lake [id number] FCP    Culture (A)  Final    AEROCOCCUS URINAE Standardized susceptibility testing for this organism is not available. Performed at [facility], [place] [address] [place], [place] [address]    Report Status 02/27/2019 FINAL  Final  Blood Culture ID Panel (Reflexed)     Status: None   Collection Time: 02/23/19  9:52 AM  Result Value Ref Range Status   Enterococcus species NOT DETECTED NOT DETECTED Final   Listeria monocytogenes NOT DETECTED NOT DETECTED Final   Staphylococcus species NOT DETECTED NOT DETECTED Final   Staphylococcus aureus (BCID) NOT DETECTED NOT DETECTED Final   Streptococcus species NOT DETECTED NOT DETECTED Final   Streptococcus agalactiae NOT DETECTED NOT DETECTED Final   Streptococcus pneumoniae NOT DETECTED NOT DETECTED Final   Streptococcus pyogenes NOT DETECTED NOT DETECTED Final   Acinetobacter baumannii NOT DETECTED NOT DETECTED Final   Enterobacteriaceae species NOT DETECTED NOT DETECTED Final   Enterobacter cloacae complex NOT DETECTED NOT DETECTED Final   Escherichia coli NOT DETECTED NOT DETECTED Final   Klebsiella oxytoca NOT DETECTED NOT DETECTED Final   Klebsiella pneumoniae NOT DETECTED NOT DETECTED Final   Proteus species NOT DETECTED NOT DETECTED Final   Serratia marcescens NOT DETECTED NOT DETECTED Final   Haemophilus influenzae NOT DETECTED NOT DETECTED Final   Neisseria meningitidis NOT DETECTED NOT DETECTED Final   Pseudomonas aeruginosa NOT DETECTED NOT DETECTED Final   Candida albicans NOT DETECTED NOT DETECTED Final   Candida glabrata NOT DETECTED NOT DETECTED Final   Candida krusei NOT DETECTED NOT DETECTED Final   Candida parapsilosis NOT DETECTED NOT DETECTED Final   Candida tropicalis NOT DETECTED NOT DETECTED Final    Comment:  Performed at [facility], [place] [address] [place], [place] [address]  Blood culture (routine x 2)     Status: None   Collection Time: 02/23/19  9:58 AM   Specimen: BLOOD  Result Value Ref Range Status   Specimen Description BLOOD SITE NOT SPECIFIED  Final   Special Requests   Final    BOTTLES DRAWN AEROBIC AND ANAEROBIC Blood Culture results may not be optimal due to an inadequate volume of blood received in culture bottles   Culture   Final    NO GROWTH 5 DAYS Performed at [facility], [place] [address] [place], [place] [address]    Report Status 02/28/2019 FINAL  Final  SARS Coronavirus 2 [facility] order, Performed in [facility] hospital lab) Nasopharyngeal Nasopharyngeal Swab     Status: None   Collection Time: 02/23/19 10:01 AM   Specimen: Nasopharyngeal Swab  Result Value Ref Range Status   SARS Coronavirus 2 NEGATIVE NEGATIVE Final    Comment: (NOTE) If result is NEGATIVE SARS-CoV-2 target nucleic acids  are NOT DETECTED. The SARS-CoV-2 RNA is generally detectable in upper and lower  respiratory specimens during the acute phase of infection. The lowest  concentration of SARS-CoV-2 viral copies this assay can detect is 250  copies / mL. A negative result does not preclude SARS-CoV-2 infection  and should not be used as the sole basis for treatment or other  patient management decisions.  A negative result may occur with  improper specimen collection / handling, submission of specimen other  than nasopharyngeal swab, presence of viral mutation(s) within the  areas targeted by this assay, and inadequate number of viral copies  (<250 copies / mL). A negative result must be combined with clinical  observations, patient history, and epidemiological information. If result is POSITIVE SARS-CoV-2 target nucleic acids are DETECTED. The SARS-CoV-2 RNA is generally detectable in upper and lower  respiratory specimens dur ing the acute phase of infection.  Positive  results  are indicative of active infection with SARS-CoV-2.  Clinical  correlation with patient history and other diagnostic information is  necessary to determine patient infection status.  Positive results do  not rule out bacterial infection or co-infection with other viruses. If result is PRESUMPTIVE POSTIVE SARS-CoV-2 nucleic acids MAY BE PRESENT.   A presumptive positive result was obtained on the submitted specimen  and confirmed on repeat testing.  While 2019 novel coronavirus  (SARS-CoV-2) nucleic acids may be present in the submitted sample  additional confirmatory testing may be necessary for epidemiological  and / or clinical management purposes  to differentiate between  SARS-CoV-2 and other Sarbecovirus currently known to infect humans.  If clinically indicated additional testing with an alternate test  methodology [phone]) is advised. The SARS-CoV-2 RNA is generally  detectable in upper and lower respiratory sp ecimens during the acute  phase of infection. The expected result is Negative. Fact Sheet for Patients:  StrictlyIdeas.no Fact Sheet for Healthcare Providers: BankingDealers.co.za This test is not yet approved or cleared by the [place] FDA and has been authorized for detection and/or diagnosis of SARS-CoV-2 by FDA under an Emergency Use Authorization (EUA).  This EUA will remain in effect (meaning this test can be used) for the duration of the COVID-19 declaration under Section 564(b)(1) of the Act, 21 U.S.C. section 360bbb-3(b)(1), unless the authorization is terminated or revoked sooner. Performed at [facility], [place] [address] [place], [place] [address]   Urine culture     Status: Abnormal   Collection Time: 02/23/19 10:57 AM   Specimen: Urine, Random  Result Value Ref Range Status   Specimen Description URINE, RANDOM  Final   Special Requests   Final    NONE Performed at [facility], [place] [address], [place], [place] [address]    Culture >=100,000 COLONIES/mL ENTEROCOCCUS FAECALIS (A)  Final   Report Status 02/25/2019 FINAL  Final   Organism ID, Bacteria ENTEROCOCCUS FAECALIS (A)  Final      Susceptibility   Enterococcus faecalis - MIC*    AMPICILLIN <=2 SENSITIVE Sensitive     LEVOFLOXACIN >=8 RESISTANT Resistant     NITROFURANTOIN 64 INTERMEDIATE Intermediate     VANCOMYCIN 1 SENSITIVE Sensitive     * >=100,000 COLONIES/mL ENTEROCOCCUS FAECALIS     Labs: BNP (last 3 results) No results for input(s): BNP in the last 8760 hours. Basic Metabolic Panel: Recent Labs  Lab 02/25/19 0434 02/27/19 0404 02/28/19 0410 03/02/19 0338  NA 138 136 139  --   K 3.7 3.4* 3.7  --  CL 107 102 106  --   CO2 22 25 26   --   GLUCOSE 84 76 67*  --   BUN 21 16 21   --   CREATININE 0.65 0.72 0.77 0.70  CALCIUM 7.9* 7.7* 8.1*  --    Liver Function Tests: No results for input(s): AST, ALT, ALKPHOS, BILITOT, PROT, ALBUMIN in the last 168 hours. No results for input(s): LIPASE, AMYLASE in the last 168 hours. No results for input(s): AMMONIA in the last 168 hours. CBC: Recent Labs  Lab 02/25/19 0434 02/28/19 0410  WBC 12.5* 11.8*  HGB 12.4* 12.7*  HCT 37.3* 37.9*  MCV 90.8 91.3  PLT 170 227   Cardiac Enzymes: No results for input(s): CKTOTAL, CKMB, CKMBINDEX, TROPONINI in the last 168 hours. BNP: Invalid input(s): POCBNP CBG: Recent Labs  Lab 03/02/19 0659 03/02/19 1144 03/02/19 1708 03/02/19 2052 03/03/19 0628  GLUCAP 81 104* 197* 132* 89   D-Dimer No results for input(s): DDIMER in the last 72 hours. Hgb A1c No results for input(s): HGBA1C in the last 72 hours. Lipid Profile No results for input(s): CHOL, HDL, LDLCALC, TRIG, CHOLHDL, LDLDIRECT in the last 72 hours. Thyroid function studies No results for input(s): TSH, T4TOTAL, T3FREE, THYROIDAB in the last 72 hours.  Invalid input(s): FREET3 Anemia work up No results for input(s): VITAMINB12, FOLATE, FERRITIN,  TIBC, IRON, RETICCTPCT in the last 72 hours. Urinalysis    Component Value Date/Time   COLORURINE YELLOW 02/23/2019 1057   APPEARANCEUR CLEAR 02/23/2019 1057   LABSPEC 1.035 (H) 02/23/2019 1057   PHURINE 6.0 02/23/2019 1057   GLUCOSEU NEGATIVE 02/23/2019 1057   HGBUR SMALL (A) 02/23/2019 1057   BILIRUBINUR NEGATIVE 02/23/2019 1057   KETONESUR NEGATIVE 02/23/2019 1057   PROTEINUR NEGATIVE 02/23/2019 1057   UROBILINOGEN 0.2 05/29/2014 2125   NITRITE NEGATIVE 02/23/2019 1057   LEUKOCYTESUR TRACE (A) 02/23/2019 1057   Sepsis Labs Invalid input(s): PROCALCITONIN,  WBC,  LACTICIDVEN   Time coordinating discharge: 35 minutes  SIGNED:  Mercy Riding, MD  [facility] 03/03/2019, 7:55 AM  If 7PM-7AM, please contact night-coverage [email or web address] Password [email or web address]

## 2019-03-03 NOTE — Plan of Care (Signed)
  Problem: Activity: Goal: Risk for activity intolerance will decrease Outcome: Progressing   

## 2019-03-20 ENCOUNTER — Inpatient Hospital Stay (HOSPITAL_COMMUNITY)
Admission: EM | Admit: 2019-03-20 | Discharge: 2019-03-24 | DRG: 689 | Disposition: A | Discharge status: Skilled Nursing Facility | Payer: Medicare Other | Attending: Internal Medicine | Admitting: Internal Medicine

## 2019-03-20 ENCOUNTER — Other Ambulatory Visit: Payer: Self-pay

## 2019-03-20 DIAGNOSIS — Z79899 Other long term (current) drug therapy: Secondary | ICD-10-CM

## 2019-03-20 DIAGNOSIS — J189 Pneumonia, unspecified organism: Secondary | ICD-10-CM | POA: Diagnosis present

## 2019-03-20 DIAGNOSIS — G600 Hereditary motor and sensory neuropathy: Secondary | ICD-10-CM | POA: Diagnosis present

## 2019-03-20 DIAGNOSIS — Z87440 Personal history of urinary (tract) infections: Secondary | ICD-10-CM

## 2019-03-20 DIAGNOSIS — B9620 Unspecified Escherichia coli [E. coli] as the cause of diseases classified elsewhere: Secondary | ICD-10-CM | POA: Diagnosis present

## 2019-03-20 DIAGNOSIS — Z981 Arthrodesis status: Secondary | ICD-10-CM

## 2019-03-20 DIAGNOSIS — Z9181 History of falling: Secondary | ICD-10-CM

## 2019-03-20 DIAGNOSIS — Z87891 Personal history of nicotine dependence: Secondary | ICD-10-CM

## 2019-03-20 DIAGNOSIS — Z8619 Personal history of other infectious and parasitic diseases: Secondary | ICD-10-CM

## 2019-03-20 DIAGNOSIS — I10 Essential (primary) hypertension: Secondary | ICD-10-CM | POA: Diagnosis present

## 2019-03-20 DIAGNOSIS — Z888 Allergy status to other drugs, medicaments and biological substances status: Secondary | ICD-10-CM

## 2019-03-20 DIAGNOSIS — Z8673 Personal history of transient ischemic attack (TIA), and cerebral infarction without residual deficits: Secondary | ICD-10-CM

## 2019-03-20 DIAGNOSIS — Z8249 Family history of ischemic heart disease and other diseases of the circulatory system: Secondary | ICD-10-CM

## 2019-03-20 DIAGNOSIS — A419 Sepsis, unspecified organism: Secondary | ICD-10-CM

## 2019-03-20 DIAGNOSIS — N390 Urinary tract infection, site not specified: Principal | ICD-10-CM | POA: Diagnosis present

## 2019-03-20 DIAGNOSIS — Z880 Allergy status to penicillin: Secondary | ICD-10-CM

## 2019-03-20 DIAGNOSIS — Z20828 Contact with and (suspected) exposure to other viral communicable diseases: Secondary | ICD-10-CM | POA: Diagnosis present

## 2019-03-20 DIAGNOSIS — I480 Paroxysmal atrial fibrillation: Secondary | ICD-10-CM | POA: Diagnosis present

## 2019-03-20 DIAGNOSIS — Z887 Allergy status to serum and vaccine status: Secondary | ICD-10-CM

## 2019-03-20 DIAGNOSIS — C8510 Unspecified B-cell lymphoma, unspecified site: Secondary | ICD-10-CM | POA: Diagnosis present

## 2019-03-20 DIAGNOSIS — Z7982 Long term (current) use of aspirin: Secondary | ICD-10-CM

## 2019-03-20 DIAGNOSIS — Z794 Long term (current) use of insulin: Secondary | ICD-10-CM

## 2019-03-20 DIAGNOSIS — M4856XA Collapsed vertebra, not elsewhere classified, lumbar region, initial encounter for fracture: Secondary | ICD-10-CM | POA: Diagnosis present

## 2019-03-20 DIAGNOSIS — Z8614 Personal history of Methicillin resistant Staphylococcus aureus infection: Secondary | ICD-10-CM

## 2019-03-20 DIAGNOSIS — R6510 Systemic inflammatory response syndrome (SIRS) of non-infectious origin without acute organ dysfunction: Secondary | ICD-10-CM | POA: Diagnosis present

## 2019-03-20 DIAGNOSIS — N471 Phimosis: Secondary | ICD-10-CM | POA: Diagnosis present

## 2019-03-20 DIAGNOSIS — Z7902 Long term (current) use of antithrombotics/antiplatelets: Secondary | ICD-10-CM

## 2019-03-20 DIAGNOSIS — S32000A Wedge compression fracture of unspecified lumbar vertebra, initial encounter for closed fracture: Secondary | ICD-10-CM | POA: Diagnosis present

## 2019-03-20 DIAGNOSIS — R300 Dysuria: Secondary | ICD-10-CM | POA: Diagnosis not present

## 2019-03-20 DIAGNOSIS — E1142 Type 2 diabetes mellitus with diabetic polyneuropathy: Secondary | ICD-10-CM | POA: Diagnosis present

## 2019-03-20 LAB — CBC WITH DIFFERENTIAL/PLATELET
Abs Immature Granulocytes: 0.16 10*3/uL — ABNORMAL HIGH (ref 0.00–0.07)
Basophils Absolute: 0.1 10*3/uL (ref 0.0–0.1)
Basophils Relative: 0 %
Eosinophils Absolute: 0.2 10*3/uL (ref 0.0–0.5)
Eosinophils Relative: 1 %
HCT: 43.2 % (ref 39.0–52.0)
Hemoglobin: 14.7 g/dL (ref 13.0–17.0)
Immature Granulocytes: 1 %
Lymphocytes Relative: 6 %
Lymphs Abs: 1.3 10*3/uL (ref 0.7–4.0)
MCH: 30.6 pg (ref 26.0–34.0)
MCHC: 34 g/dL (ref 30.0–36.0)
MCV: 90 fL (ref 80.0–100.0)
Monocytes Absolute: 1.6 10*3/uL — ABNORMAL HIGH (ref 0.1–1.0)
Monocytes Relative: 7 %
Neutro Abs: 20.2 10*3/uL — ABNORMAL HIGH (ref 1.7–7.7)
Neutrophils Relative %: 85 %
Platelets: 286 10*3/uL (ref 150–400)
RBC: 4.8 MIL/uL (ref 4.22–5.81)
RDW: 13.2 % (ref 11.5–15.5)
WBC: 23.5 10*3/uL — ABNORMAL HIGH (ref 4.0–10.5)
nRBC: 0 % (ref 0.0–0.2)

## 2019-03-20 LAB — APTT: aPTT: 33 seconds (ref 24–36)

## 2019-03-20 LAB — PROTIME-INR
INR: 1.2 (ref 0.8–1.2)
Prothrombin Time: 14.6 seconds (ref 11.4–15.2)

## 2019-03-20 NOTE — ED Triage Notes (Signed)
Pt from home via [facility] c/o urinary pain and incontinence. Pt has hx of same. Pt is alert and oriented at time of triage. VSS.

## 2019-03-20 NOTE — ED Notes (Signed)
Urinalysis obtained; not enough for culture at this time

## 2019-03-21 ENCOUNTER — Emergency Department (HOSPITAL_COMMUNITY): Payer: Medicare Other

## 2019-03-21 ENCOUNTER — Encounter (HOSPITAL_COMMUNITY): Payer: Self-pay | Admitting: Internal Medicine

## 2019-03-21 DIAGNOSIS — G600 Hereditary motor and sensory neuropathy: Secondary | ICD-10-CM | POA: Diagnosis present

## 2019-03-21 DIAGNOSIS — Z7982 Long term (current) use of aspirin: Secondary | ICD-10-CM | POA: Diagnosis not present

## 2019-03-21 DIAGNOSIS — S32030S Wedge compression fracture of third lumbar vertebra, sequela: Secondary | ICD-10-CM | POA: Diagnosis not present

## 2019-03-21 DIAGNOSIS — Z887 Allergy status to serum and vaccine status: Secondary | ICD-10-CM | POA: Diagnosis not present

## 2019-03-21 DIAGNOSIS — E1142 Type 2 diabetes mellitus with diabetic polyneuropathy: Secondary | ICD-10-CM

## 2019-03-21 DIAGNOSIS — I10 Essential (primary) hypertension: Secondary | ICD-10-CM | POA: Diagnosis present

## 2019-03-21 DIAGNOSIS — Z7189 Other specified counseling: Secondary | ICD-10-CM | POA: Diagnosis not present

## 2019-03-21 DIAGNOSIS — Z7902 Long term (current) use of antithrombotics/antiplatelets: Secondary | ICD-10-CM | POA: Diagnosis not present

## 2019-03-21 DIAGNOSIS — B9620 Unspecified Escherichia coli [E. coli] as the cause of diseases classified elsewhere: Secondary | ICD-10-CM | POA: Diagnosis present

## 2019-03-21 DIAGNOSIS — N390 Urinary tract infection, site not specified: Secondary | ICD-10-CM | POA: Diagnosis present

## 2019-03-21 DIAGNOSIS — R6510 Systemic inflammatory response syndrome (SIRS) of non-infectious origin without acute organ dysfunction: Secondary | ICD-10-CM | POA: Diagnosis not present

## 2019-03-21 DIAGNOSIS — Z87891 Personal history of nicotine dependence: Secondary | ICD-10-CM | POA: Diagnosis not present

## 2019-03-21 DIAGNOSIS — Z9181 History of falling: Secondary | ICD-10-CM | POA: Diagnosis not present

## 2019-03-21 DIAGNOSIS — I480 Paroxysmal atrial fibrillation: Secondary | ICD-10-CM | POA: Diagnosis present

## 2019-03-21 DIAGNOSIS — Z8673 Personal history of transient ischemic attack (TIA), and cerebral infarction without residual deficits: Secondary | ICD-10-CM | POA: Diagnosis not present

## 2019-03-21 DIAGNOSIS — J189 Pneumonia, unspecified organism: Secondary | ICD-10-CM | POA: Diagnosis present

## 2019-03-21 DIAGNOSIS — Z8249 Family history of ischemic heart disease and other diseases of the circulatory system: Secondary | ICD-10-CM | POA: Diagnosis not present

## 2019-03-21 DIAGNOSIS — Z20828 Contact with and (suspected) exposure to other viral communicable diseases: Secondary | ICD-10-CM | POA: Diagnosis present

## 2019-03-21 DIAGNOSIS — Z888 Allergy status to other drugs, medicaments and biological substances status: Secondary | ICD-10-CM | POA: Diagnosis not present

## 2019-03-21 DIAGNOSIS — N471 Phimosis: Secondary | ICD-10-CM | POA: Diagnosis present

## 2019-03-21 DIAGNOSIS — Z981 Arthrodesis status: Secondary | ICD-10-CM | POA: Diagnosis not present

## 2019-03-21 DIAGNOSIS — Z880 Allergy status to penicillin: Secondary | ICD-10-CM | POA: Diagnosis not present

## 2019-03-21 DIAGNOSIS — R300 Dysuria: Secondary | ICD-10-CM | POA: Diagnosis present

## 2019-03-21 DIAGNOSIS — Z8619 Personal history of other infectious and parasitic diseases: Secondary | ICD-10-CM | POA: Diagnosis not present

## 2019-03-21 DIAGNOSIS — Z87440 Personal history of urinary (tract) infections: Secondary | ICD-10-CM | POA: Diagnosis not present

## 2019-03-21 DIAGNOSIS — C8510 Unspecified B-cell lymphoma, unspecified site: Secondary | ICD-10-CM | POA: Diagnosis present

## 2019-03-21 DIAGNOSIS — Z515 Encounter for palliative care: Secondary | ICD-10-CM | POA: Diagnosis not present

## 2019-03-21 DIAGNOSIS — C8520 Mediastinal (thymic) large B-cell lymphoma, unspecified site: Secondary | ICD-10-CM | POA: Diagnosis not present

## 2019-03-21 DIAGNOSIS — M4856XA Collapsed vertebra, not elsewhere classified, lumbar region, initial encounter for fracture: Secondary | ICD-10-CM | POA: Diagnosis present

## 2019-03-21 DIAGNOSIS — Z8614 Personal history of Methicillin resistant Staphylococcus aureus infection: Secondary | ICD-10-CM | POA: Diagnosis not present

## 2019-03-21 LAB — COMPREHENSIVE METABOLIC PANEL
ALT: 20 U/L (ref 0–44)
ALT: 17 U/L (ref 0–44)
AST: 22 U/L (ref 15–41)
AST: 17 U/L (ref 15–41)
Albumin: 2.5 g/dL — ABNORMAL LOW (ref 3.5–5.0)
Albumin: 2.2 g/dL — ABNORMAL LOW (ref 3.5–5.0)
Alkaline Phosphatase: 89 U/L (ref 38–126)
Alkaline Phosphatase: 77 U/L (ref 38–126)
Anion gap: 12 (ref 5–15)
Anion gap: 8 (ref 5–15)
BUN: 20 mg/dL (ref 8–23)
BUN: 22 mg/dL (ref 8–23)
CO2: 23 mmol/L (ref 22–32)
CO2: 25 mmol/L (ref 22–32)
Calcium: 8.3 mg/dL — ABNORMAL LOW (ref 8.9–10.3)
Calcium: 8 mg/dL — ABNORMAL LOW (ref 8.9–10.3)
Chloride: 103 mmol/L (ref 98–111)
Chloride: 108 mmol/L (ref 98–111)
Creatinine, Ser: 0.84 mg/dL (ref 0.61–1.24)
Creatinine, Ser: 0.9 mg/dL (ref 0.61–1.24)
GFR calc Af Amer: >60 mL/min (ref >60)
GFR calc Af Amer: >60 mL/min (ref >60)
GFR calc non Af Amer: >60 mL/min (ref >60)
GFR calc non Af Amer: >60 mL/min (ref >60)
Glucose, Bld: 191 mg/dL — ABNORMAL HIGH (ref 70–99)
Glucose, Bld: 101 mg/dL — ABNORMAL HIGH (ref 70–99)
Potassium: 4.1 mmol/L (ref 3.5–5.1)
Potassium: 3.9 mmol/L (ref 3.5–5.1)
Sodium: 138 mmol/L (ref 135–145)
Sodium: 141 mmol/L (ref 135–145)
Total Bilirubin: 0.5 mg/dL (ref 0.3–1.2)
Total Bilirubin: 0.8 mg/dL (ref 0.3–1.2)
Total Protein: 6.4 g/dL — ABNORMAL LOW (ref 6.5–8.1)
Total Protein: 5.6 g/dL — ABNORMAL LOW (ref 6.5–8.1)

## 2019-03-21 LAB — URINALYSIS, ROUTINE W REFLEX MICROSCOPIC
Glucose, UA: NEGATIVE mg/dL
Ketones, ur: 15 mg/dL — AB
Nitrite: POSITIVE — AB
Protein, ur: >300 mg/dL — AB
Specific Gravity, Urine: 1.02 (ref 1.005–1.030)
pH: 6.5 (ref 5.0–8.0)

## 2019-03-21 LAB — GLUCOSE, CAPILLARY
Glucose-Capillary: 167 mg/dL — ABNORMAL HIGH (ref 70–99)
Glucose-Capillary: 151 mg/dL — ABNORMAL HIGH (ref 70–99)
Glucose-Capillary: 141 mg/dL — ABNORMAL HIGH (ref 70–99)

## 2019-03-21 LAB — CBC WITH DIFFERENTIAL/PLATELET
Abs Immature Granulocytes: 0.16 10*3/uL — ABNORMAL HIGH (ref 0.00–0.07)
Basophils Absolute: 0.1 10*3/uL (ref 0.0–0.1)
Basophils Relative: 0 %
Eosinophils Absolute: 0.2 10*3/uL (ref 0.0–0.5)
Eosinophils Relative: 1 %
HCT: 40.8 % (ref 39.0–52.0)
Hemoglobin: 13.1 g/dL (ref 13.0–17.0)
Immature Granulocytes: 1 %
Lymphocytes Relative: 8 %
Lymphs Abs: 1.6 10*3/uL (ref 0.7–4.0)
MCH: 29.6 pg (ref 26.0–34.0)
MCHC: 32.1 g/dL (ref 30.0–36.0)
MCV: 92.1 fL (ref 80.0–100.0)
Monocytes Absolute: 2.2 10*3/uL — ABNORMAL HIGH (ref 0.1–1.0)
Monocytes Relative: 10 %
Neutro Abs: 16.8 10*3/uL — ABNORMAL HIGH (ref 1.7–7.7)
Neutrophils Relative %: 80 %
Platelets: 272 10*3/uL (ref 150–400)
RBC: 4.43 MIL/uL (ref 4.22–5.81)
RDW: 13.5 % (ref 11.5–15.5)
WBC: 21 10*3/uL — ABNORMAL HIGH (ref 4.0–10.5)
nRBC: 0 % (ref 0.0–0.2)

## 2019-03-21 LAB — CBG MONITORING, ED: Glucose-Capillary: 88 mg/dL (ref 70–99)

## 2019-03-21 LAB — SARS CORONAVIRUS 2 (TAT 6-24 HRS): SARS Coronavirus 2: NEGATIVE

## 2019-03-21 LAB — LACTIC ACID, PLASMA
Lactic Acid, Venous: 1.7 mmol/L (ref 0.5–1.9)
Lactic Acid, Venous: 1.4 mmol/L (ref 0.5–1.9)

## 2019-03-21 LAB — URINALYSIS, MICROSCOPIC (REFLEX)

## 2019-03-21 MED ORDER — INSULIN ASPART 100 UNIT/ML ~~LOC~~ SOLN
0.0000 [IU] | Freq: Three times a day (TID) | SUBCUTANEOUS | Status: DC
  Administered 2019-03-21 (×2): 2 [IU] via SUBCUTANEOUS
  Administered 2019-03-22 (×3): 1 [IU] via SUBCUTANEOUS
  Administered 2019-03-23: 2 [IU] via SUBCUTANEOUS
  Administered 2019-03-23: 1 [IU] via SUBCUTANEOUS

## 2019-03-21 MED ORDER — ACETAMINOPHEN 650 MG RE SUPP: 650.0000 mg | Freq: Four times a day (QID) | RECTAL | Status: DC | PRN

## 2019-03-21 MED ORDER — HYDROCODONE-ACETAMINOPHEN 5-325 MG PO TABS
1.0000 | ORAL_TABLET | ORAL | Status: DC | PRN
  Administered 2019-03-21 – 2019-03-24 (×9): 1 via ORAL
  Filled 2019-03-21 (×9): qty 1

## 2019-03-21 MED ORDER — SODIUM CHLORIDE 0.9 % IV SOLN
INTRAVENOUS | Status: AC
  Administered 2019-03-21: 11:00:00 via INTRAVENOUS

## 2019-03-21 MED ORDER — HYDROCODONE-ACETAMINOPHEN 5-325 MG PO TABS: 1.0000 | ORAL_TABLET | Freq: Four times a day (QID) | ORAL | Status: DC | PRN

## 2019-03-21 MED ORDER — MORPHINE SULFATE (PF) 2 MG/ML IV SOLN
1.0000 mg | INTRAVENOUS | Status: DC | PRN
  Administered 2019-03-21 – 2019-03-23 (×3): 1 mg via INTRAVENOUS
  Filled 2019-03-21 (×3): qty 1

## 2019-03-21 MED ORDER — SIMVASTATIN 20 MG PO TABS
20.0000 mg | ORAL_TABLET | Freq: Every day | ORAL | Status: DC
  Administered 2019-03-21 – 2019-03-23 (×3): 20 mg via ORAL
  Filled 2019-03-21 (×3): qty 1

## 2019-03-21 MED ORDER — ONDANSETRON HCL 4 MG PO TABS: 4.0000 mg | ORAL_TABLET | Freq: Four times a day (QID) | ORAL | Status: DC | PRN

## 2019-03-21 MED ORDER — SODIUM CHLORIDE 0.9 % IV SOLN
2.0000 g | Freq: Three times a day (TID) | INTRAVENOUS | Status: DC
  Administered 2019-03-21 – 2019-03-23 (×6): 2 g via INTRAVENOUS
  Filled 2019-03-21 (×8): qty 2

## 2019-03-21 MED ORDER — LIDOCAINE 5 % EX PTCH
1.0000 | MEDICATED_PATCH | CUTANEOUS | Status: DC
  Administered 2019-03-21 – 2019-03-24 (×5): 1 via TRANSDERMAL
  Filled 2019-03-21 (×5): qty 1

## 2019-03-21 MED ORDER — VANCOMYCIN HCL 10 G IV SOLR
1500.0000 mg | INTRAVENOUS | Status: DC
  Administered 2019-03-21: 1500 mg via INTRAVENOUS
  Filled 2019-03-21 (×2): qty 1500

## 2019-03-21 MED ORDER — SODIUM CHLORIDE 0.9 % IV SOLN
2.0000 g | Freq: Once | INTRAVENOUS | Status: AC
  Administered 2019-03-21: 2 g via INTRAVENOUS
  Filled 2019-03-21: qty 2

## 2019-03-21 MED ORDER — ASPIRIN EC 81 MG PO TBEC
81.0000 mg | DELAYED_RELEASE_TABLET | Freq: Every evening | ORAL | Status: DC
  Administered 2019-03-21 – 2019-03-23 (×3): 81 mg via ORAL
  Filled 2019-03-21 (×3): qty 1

## 2019-03-21 MED ORDER — LISINOPRIL 5 MG PO TABS
2.5000 mg | ORAL_TABLET | Freq: Every day | ORAL | Status: DC
  Administered 2019-03-21 – 2019-03-24 (×4): 2.5 mg via ORAL
  Filled 2019-03-21 (×4): qty 1

## 2019-03-21 MED ORDER — METOPROLOL TARTRATE 12.5 MG HALF TABLET
12.5000 mg | ORAL_TABLET | Freq: Two times a day (BID) | ORAL | Status: DC
  Administered 2019-03-21 – 2019-03-24 (×7): 12.5 mg via ORAL
  Filled 2019-03-21 (×7): qty 1

## 2019-03-21 MED ORDER — ONDANSETRON HCL 4 MG/2ML IJ SOLN: 4.0000 mg | Freq: Four times a day (QID) | INTRAMUSCULAR | Status: DC | PRN

## 2019-03-21 MED ORDER — SODIUM CHLORIDE 0.9 % IV BOLUS
500.0000 mL | Freq: Once | INTRAVENOUS | Status: AC
  Administered 2019-03-21: 500 mL via INTRAVENOUS

## 2019-03-21 MED ORDER — HEPARIN SODIUM (PORCINE) 5000 UNIT/ML IJ SOLN
5000.0000 [IU] | Freq: Three times a day (TID) | INTRAMUSCULAR | Status: DC
  Filled 2019-03-21 (×5): qty 1

## 2019-03-21 MED ORDER — ACETAMINOPHEN 325 MG PO TABS: 650.0000 mg | ORAL_TABLET | Freq: Four times a day (QID) | ORAL | Status: DC | PRN

## 2019-03-21 MED ORDER — AMLODIPINE BESYLATE 5 MG PO TABS
5.0000 mg | ORAL_TABLET | Freq: Two times a day (BID) | ORAL | Status: DC
  Administered 2019-03-21 – 2019-03-24 (×7): 5 mg via ORAL
  Filled 2019-03-21 (×7): qty 1

## 2019-03-21 MED ORDER — VANCOMYCIN HCL 10 G IV SOLR
1500.0000 mg | Freq: Once | INTRAVENOUS | Status: AC
  Administered 2019-03-21: 1500 mg via INTRAVENOUS
  Filled 2019-03-21: qty 1500

## 2019-03-21 MED ORDER — INSULIN GLARGINE 100 UNIT/ML ~~LOC~~ SOLN
12.0000 [IU] | Freq: Every day | SUBCUTANEOUS | Status: DC
  Administered 2019-03-22 – 2019-03-23 (×2): 12 [IU] via SUBCUTANEOUS
  Administered 2019-03-24: 6 [IU] via SUBCUTANEOUS
  Filled 2019-03-21 (×5): qty 0.12

## 2019-03-21 MED ORDER — CLOPIDOGREL BISULFATE 75 MG PO TABS
75.0000 mg | ORAL_TABLET | Freq: Every day | ORAL | Status: DC
  Administered 2019-03-21 – 2019-03-24 (×4): 75 mg via ORAL
  Filled 2019-03-21 (×4): qty 1

## 2019-03-21 NOTE — Evaluation (Signed)
Physical Therapy Evaluation Patient Details Name: [name] MRN: [id number] DOB: 11-30-1940 Today's Date: 03/21/2019   History of Present Illness  Pt is a 78 y/o male admitted secondary to urinary pain and incontinence. Found to have SIRS secondary to UTI. Pt also with recent fall and L1 and L3 compression fx. PMH includes CVA, a fib, HTN, DM.   Clinical Impression  Pt admitted secondary to problem above with deficits below. Pt limited this session secondary to pain. Was only able to take side steps at EOB with max encouragement. Required min to mod A for mobility tasks using RW. Feel pt is at increased risk for falls and would benefit from SNF level therapies to increase independence and safety. Will continue to follow acutely.     Follow Up Recommendations SNF    Equipment Recommendations  None recommended by PT    Recommendations for Other Services       Precautions / Restrictions Precautions Precautions: Fall;Back Precaution Booklet Issued: No Precaution Comments: Verbally reviewed back precautions.  Required Braces or Orthoses: Spinal Brace Spinal Brace: Lumbar corset;Applied in sitting position Restrictions Weight Bearing Restrictions: No      Mobility  Bed Mobility Overal bed mobility: Needs Assistance Bed Mobility: Sidelying to Sit;Sit to Sidelying   Sidelying to sit: Mod assist     Sit to sidelying: Mod assist General bed mobility comments: Mod A for trunk elevation and LE assist to sit at EOB. Required increased time secondary to pain. Mod A for LE assist for return to sidelying.   Transfers Overall transfer level: Needs assistance Equipment used: Rolling walker (2 wheeled) Transfers: Sit to/from Stand Sit to Stand: Min assist;From elevated surface         General transfer comment: Min A for lift assist and steadying to stand from elevated surface. Performed sit<>Stand X 2.   Ambulation/Gait Ambulation/Gait assistance: Min assist   Assistive  device: Rolling walker (2 wheeled)   Gait velocity: Decreased   General Gait Details: Only able to take side steps at EOB secondary to pain. Heavy reliance on UE and required assist to move RW.   Stairs            Wheelchair Mobility    Modified Rankin (Stroke Patients Only)       Balance Overall balance assessment: Needs assistance Sitting-balance support: Feet supported;Bilateral upper extremity supported Sitting balance-Leahy Scale: Poor Sitting balance - Comments: Reliant on BUE support to maintain sitting balance.    Standing balance support: Bilateral upper extremity supported;During functional activity Standing balance-Leahy Scale: Poor Standing balance comment: Heavy reliance on UE support to maintain balance.                              Pertinent Vitals/Pain Pain Assessment: 0-10 Pain Score: 10-Worst pain ever Pain Location: Back Pain Descriptors / Indicators: Grimacing;Guarding;Moaning Pain Intervention(s): Monitored during session;Limited activity within patient's tolerance;Repositioned    Home Living Family/patient expects to be discharged to:: Private residence Living Arrangements: Children Available Help at Discharge: Family;Available 24 hours/day Type of Home: House Home Access: Stairs to enter Entrance Stairs-Rails: Right Entrance Stairs-Number of Steps: 4 Home Layout: One level Home Equipment: Cane - single point;Cane - quad;Walker - 2 wheels;Wheelchair - manual;Bedside commode;Walker - 4 wheels      Prior Function Level of Independence: Needs assistance   Gait / Transfers Assistance Needed: Since d/c from hospital, has been staying in the bed. Only gets out of the bed  when PT is there.   ADL's / Homemaking Assistance Needed: Requires assist for ADL tasks.         Hand Dominance        Extremity/Trunk Assessment   Upper Extremity Assessment Upper Extremity Assessment: Defer to OT evaluation    Lower Extremity  Assessment Lower Extremity Assessment: Generalized weakness    Cervical / Trunk Assessment Cervical / Trunk Assessment: Other exceptions Cervical / Trunk Exceptions: lumbar compression fx.   Communication   Communication: No difficulties  Cognition Arousal/Alertness: Awake/alert Behavior During Therapy: WFL for tasks assessed/performed Overall Cognitive Status: Within Functional Limits for tasks assessed                                        General Comments General comments (skin integrity, edema, etc.): Pt daughter present during session.     Exercises     Assessment/Plan    PT Assessment Patient needs continued PT services  PT Problem List Decreased strength;Decreased activity tolerance;Decreased balance;Decreased mobility;Decreased coordination;Decreased knowledge of precautions;Decreased knowledge of use of DME;Pain       PT Treatment Interventions DME instruction;Gait training;Stair training;Functional mobility training;Therapeutic exercise;Therapeutic activities;Balance training;Patient/family education    PT Goals (Current goals can be found in the Care Plan section)  Acute Rehab PT Goals Patient Stated Goal: to decrease pain PT Goal Formulation: With patient Time For Goal Achievement: 04/04/19 Potential to Achieve Goals: Fair    Frequency Min 2X/week   Barriers to discharge        Co-evaluation               AM-PAC PT "6 Clicks" Mobility  Outcome Measure Help needed turning from your back to your side while in a flat bed without using bedrails?: A Lot Help needed moving from lying on your back to sitting on the side of a flat bed without using bedrails?: A Lot Help needed moving to and from a bed to a chair (including a wheelchair)?: A Lot Help needed standing up from a chair using your arms (e.g., wheelchair or bedside chair)?: A Lot Help needed to walk in hospital room?: A Lot Help needed climbing 3-5 steps with a railing? : A  Lot 6 Click Score: 12    End of Session Equipment Utilized During Treatment: Gait belt;Back brace Activity Tolerance: Patient limited by pain Patient left: in bed;with call bell/phone within reach;with bed alarm set;with family/visitor present Nurse Communication: Mobility status PT Visit Diagnosis: Unsteadiness on feet (R26.81);History of falling (Z91.81);Difficulty in walking, not elsewhere classified (R26.2);Pain Pain - part of body: (back)    Time: [id number] PT Time Calculation (min) (ACUTE ONLY): 28 min   Charges:   PT Evaluation $PT Eval Moderate Complexity: 1 Mod PT Treatments $Therapeutic Activity: 8-22 mins        Leighton Ruff, PT, DPT  Acute Rehabilitation Services  Pager: [phone] Office: [phone]   Rudean Hitt 03/21/2019, 2:27 PM

## 2019-03-21 NOTE — ED Provider Notes (Addendum)
[facility] EMERGENCY DEPARTMENT Provider Note   CSN: [id number] Arrival date & time: 03/20/19  2219     History   Chief Complaint Chief Complaint  Patient presents with  . Urinary Tract Infection    HPI [name] HOKENSON is a 78 y.o. male.     Patient presents to the emergency department for evaluation of dysuria.  Patient reports that he has been urinating every 1 hour all day today and through the course of the day it has become more painful to pass his urine.  Patient reports that he was recently treated for urinary tract infection.  He states that he was in the hospital for 3 days with sepsis earlier this month.  Patient also reports that he has back pain.  He was diagnosed recently with multiple lumbar compression fractures.  He has not had any recent falls and the pain has not changed.  He has been managing his pain with Tylenol at home.     Past Medical History:  Diagnosis Date  . A-fib ([place])   . 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 Cataract   . Charcot-Marie-Tooth disease   . Diabetes mellitus   . Hypertension   . 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 SIRS (systemic inflammatory response syndrome) ([place]) 09/2015  . Stroke ([place])   . Weakness     Patient Active Problem List   Diagnosis Date Noted  . Weakness generalized 02/23/2019  . Lumbar compression fracture ([place]) 02/08/2019  . Closed compression fracture of L3 lumbar vertebra, initial encounter ([place]) 02/08/2019  . Laryngitis   . Acute non-recurrent sinusitis   . Sore throat   . Cough   . Overflow incontinence of urine   . Urinary retention   . Acute lower UTI   . Urinary urgency   . Hypoalbuminemia due to protein-calorie malnutrition ([place])   . 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 Benign essential HTN   . H/O urethral stricture   . Acute blood loss anemia   . Poorly controlled type 2 diabetes  mellitus with peripheral neuropathy ([place])   . Debility 06/28/2017  . Personal history of urethral stricture 06/23/2017  . Rhabdomyolysis 06/23/2017  . Fever 09/23/2015  . SIRS (systemic inflammatory response syndrome) ([place]) 09/23/2015  . Near syncope 09/23/2015  . PAF (paroxysmal atrial fibrillation) ([place]) 09/23/2015  . Diabetes mellitus type 2, uncontrolled ([place]) 09/23/2015  . 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 Vertebrobasilar artery syndrome   . Orthostatic hypotension   . Insulin dependent diabetes mellitus ([place])   . Falls frequently   . Weakness 08/15/2015  . Cellulitis of right upper arm 06/02/2014  . Sepsis secondary to UTI ([place])   . Encephalopathy 05/29/2014  . Sepsis ([place]) 05/29/2014  . Lower urinary tract infectious disease 09/16/2013  . Recurrent falls 09/16/2013  . History of CVA (cerebrovascular accident) 09/16/2013  . Leukocytosis 09/16/2013  . Thrombocytopenia ([place]) 09/16/2013  . 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 Diabetes mellitus ([place]) 10/09/2011  . Hypertension 10/09/2011  . A-fib [facility])     Past Surgical History:  Procedure Laterality Date  . ANKLE FUSION Left   . CATARACT EXTRACTION    . CIRCUMCISION    . TOE AMPUTATION     right foot third toe   . URETHRA SURGERY          Home Medications  Prior to Admission medications   Medication Sig Start Date End Date Taking? Authorizing Provider  acetaminophen (TYLENOL) 500 MG tablet Take 500 mg by mouth every 6 (six) hours as needed for mild pain.    [provider]  amLODipine (NORVASC) 10 MG tablet Take 1 tablet (10 mg total) by mouth daily. Patient taking differently: Take 5 mg by mouth 2 (two) times daily.  07/09/17   Angiulli, Lavon Paganini, PA-C  aspirin EC 81 MG EC tablet Take 1 tablet (81 mg total) by mouth daily. Patient taking differently: Take 81 mg by mouth 3 (three) times a week. Monday, Wednesday and Friday 08/19/15   Florencia Reasons, MD  clopidogrel (PLAVIX) 75 MG tablet Take 75 mg by mouth daily.     [provider]  HYDROcodone-acetaminophen (NORCO/VICODIN) 5-325 MG tablet Take 1 tablet by mouth every 6 (six) hours as needed for moderate pain.    [provider]  Insulin Degludec (TRESIBA) 100 UNIT/ML SOLN Inject 18 Units into the skin daily.    [provider]  lisinopril (PRINIVIL,ZESTRIL) 2.5 MG tablet Take 3 tablets (7.5 mg total) by mouth daily. Patient taking differently: Take 2.5 mg by mouth 2 (two) times daily.  07/09/17   Angiulli, Lavon Paganini, PA-C  metoprolol tartrate (LOPRESSOR) 25 MG tablet Take 1 tablet (25 mg total) by mouth 2 (two) times daily. Patient taking differently: Take 12.5 mg by mouth 2 (two) times daily.  07/09/17   Angiulli, Lavon Paganini, PA-C  simvastatin (ZOCOR) 20 MG tablet Take 1 tablet (20 mg total) by mouth at bedtime. 07/09/17   Angiulli, Lavon Paganini, PA-C    Family History Family History  Problem Relation Age of Onset  . Hypertension Mother   . Hypertension Father     Social History Social History   Tobacco Use  . Smoking status: Former Smoker    Packs/day: 2.00    Years: 35.00    Pack years: 70.00    Quit date: 06/03/1991    Years since quitting: 27.8  . Smokeless tobacco: Former Systems developer    Quit date: 06/26/1990  Substance Use Topics  . Alcohol use: No  . Drug use: No     Allergies   Prevnar [pneumococcal 13-val conj vacc], Actos [pioglitazone hydrochloride], Hydrochlorothiazide, and Penicillins   Review of Systems Review of Systems  Genitourinary: Positive for difficulty urinating, dysuria and frequency.  Musculoskeletal: Positive for back pain.  All other systems reviewed and are negative.    Physical Exam Updated Vital Signs BP (!) 118/50   Pulse 74   Temp 99.5 F (37.5 C) (Oral)   Resp (!) 23   SpO2 97%   Physical Exam Vitals signs and nursing note reviewed.  Constitutional:      General: He is not in acute distress.    Appearance: Normal appearance. He is well-developed.  HENT:     Head:  Normocephalic and atraumatic.     Right Ear: Hearing normal.     Left Ear: Hearing normal.     Nose: Nose normal.  Eyes:     Conjunctiva/sclera: Conjunctivae normal.     Pupils: Pupils are equal, round, and reactive to light.  Neck:     Musculoskeletal: Normal range of motion and neck supple.  Cardiovascular:     Rate and Rhythm: Regular rhythm.     Heart sounds: S1 normal and S2 normal. No murmur. No friction rub. No gallop.   Pulmonary:     Effort: Pulmonary effort is normal. No respiratory distress.  Breath sounds: Normal breath sounds.  Chest:     Chest wall: No tenderness.  Abdominal:     General: Bowel sounds are normal.     Palpations: Abdomen is soft.     Tenderness: There is no abdominal tenderness. There is no guarding or rebound. Negative signs include Murphy's sign and McBurney's sign.     Hernia: No hernia is present.  Genitourinary:    Comments: Phimosis with significant smegma present at opening Musculoskeletal: Normal range of motion.  Skin:    General: Skin is warm and dry.     Findings: No rash.  Neurological:     Mental Status: He is alert and oriented to person, place, and time.     GCS: GCS eye subscore is 4. GCS verbal subscore is 5. GCS motor subscore is 6.     Cranial Nerves: No cranial nerve deficit.     Sensory: No sensory deficit.     Coordination: Coordination normal.  Psychiatric:        Speech: Speech normal.        Behavior: Behavior normal.        Thought Content: Thought content normal.      ED Treatments / Results  Labs (all labs ordered are listed, but only abnormal results are displayed) Labs Reviewed  COMPREHENSIVE METABOLIC PANEL - Abnormal; Notable for the following components:      Result Value   Glucose, Bld 191 (*)    Calcium 8.3 (*)    Total Protein 6.4 (*)    Albumin 2.5 (*)    All other components within normal limits  CBC WITH DIFFERENTIAL/PLATELET - Abnormal; Notable for the following components:   WBC 23.5 (*)     Neutro Abs 20.2 (*)    Monocytes Absolute 1.6 (*)    Abs Immature Granulocytes 0.16 (*)    All other components within normal limits  URINALYSIS, ROUTINE W REFLEX MICROSCOPIC - Abnormal; Notable for the following components:   Hgb urine dipstick LARGE (*)    Bilirubin Urine MODERATE (*)    Ketones, ur 15 (*)    Protein, ur >300 (*)    Nitrite POSITIVE (*)    Leukocytes,Ua LARGE (*)    All other components within normal limits  URINALYSIS, MICROSCOPIC (REFLEX) - Abnormal; Notable for the following components:   Bacteria, UA FEW (*)    All other components within normal limits  CULTURE, BLOOD (ROUTINE X 2)  CULTURE, BLOOD (ROUTINE X 2)  URINE CULTURE  SARS CORONAVIRUS 2 (TAT 6-24 HRS)  LACTIC ACID, PLASMA  APTT  PROTIME-INR  LACTIC ACID, PLASMA    EKG EKG Interpretation  Date/Time:  Monday March 20 2019 23:33:17 EDT Ventricular Rate:  87 PR Interval:    QRS Duration: 125 QT Interval:  414 QTC Calculation: 400 R Axis:   -75 Text Interpretation:  Sinus rhythm Multiple premature complexes, vent & supraven RBBB and LAFB Confirmed by Orpah Greek [phone]) on 03/21/2019 12:19:00 AM   Radiology No results found.  Procedures Procedures (including critical care time)  Medications Ordered in ED Medications  vancomycin (VANCOCIN) 1,500 mg in sodium chloride 0.9 % 500 mL IVPB (has no administration in time range)  ceFEPIme (MAXIPIME) 2 g in sodium chloride 0.9 % 100 mL IVPB (has no administration in time range)  sodium chloride 0.9 % bolus 500 mL (has no administration in time range)     Initial Impression / Assessment and Plan / ED Course  I have reviewed the triage vital  signs and the nursing notes.  Pertinent labs & imaging results that were available during my care of the patient were reviewed by me and considered in my medical decision making (see chart for details).        Patient presented to the emergency department for evaluation of dysuria.   Patient reports that through the course of today he has had urinary frequency and progressively worsening pain when he passes his urine.  Patient was recently hospitalized for urinary tract infection.  Urine culture ultimately grew enterococcus.  His initial urinalysis at the time of admission did not overwhelmingly suggest infection and it is not clear if the patient was given antibiotics at that time.  Urinalysis today definitely does show signs of infection.  He has a significant leukocytosis.  No hypotension and lactic acid is normal so was not given large volume fluid.  Discussed treatment with pharmacy, recommendations are cefepime and vancomycin while awaiting culture.  Patient's genitourinary anatomy is abnormal.  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  Surrounding tissue, presumably his foreskin, is contracted into a phimosis.  Suspect that this is causing some element of obstruction which is likely leading to recurrent infections.  Additionally it is causing severe hygiene difficulty which is also additive.  Patient would benefit from urology consultation at some point in near future.  CRITICAL CARE Performed by: Orpah Greek   Total critical care time: 30 minutes  Critical care time was exclusive of separately billable procedures and treating other patients.  Critical care was necessary to treat or prevent imminent or life-threatening deterioration.  Critical care was time spent personally by me on the following activities: development of treatment plan with patient and/or surrogate as well as nursing, discussions with consultants, evaluation of patient's response to treatment, examination of patient, obtaining history from patient or surrogate, ordering and performing treatments and interventions, ordering and review of laboratory studies, ordering and review of radiographic studies, pulse oximetry and re-evaluation of patient's condition.   Final  Clinical Impressions(s) / ED Diagnoses   Final diagnoses:  Sepsis without acute organ dysfunction, due to unspecified organism [facility])  Urinary tract infection without hematuria, site unspecified    ED Discharge Orders    None       , Gwenyth Allegra, MD 03/21/19 0124    Orpah Greek, MD 03/21/19 [phone]

## 2019-03-21 NOTE — ED Notes (Signed)
Pt CBG was 88, notified Nikki(NR)

## 2019-03-21 NOTE — Progress Notes (Signed)
Asked bedside RN to ask MD about abx regarding sepsis protocol, MD told the chart was being reviewed before placing order

## 2019-03-21 NOTE — H&P (Signed)
History and Physical    GAREY TURK [id number] DOB: 1940-11-24 DOA: 03/20/2019  PCP: Alroy Dust, L.Marlou Sa, MD  Patient coming from: Home.  Chief Complaint: Dysuria.  HPI: [name] is a 78 y.o. male with history of recurrent UTI recently admitted for similar symptoms and also had a recent lumbar fracture and has been bedbound presents to the ER with 2 days of worsening dysuria.  Denies any nausea vomiting abdominal pain or diarrhea.  ED Course: In the ER patient's labs show leukocytosis of 23,000 patient was mildly febrile at 99.5 with respiration of 25 chest x-ray shows possibility of infiltrates but patient denies any shortness of breath or productive cough.  Lactate is 1.7.  Creatinine 0.8.  Given that patient has had previous enterococcal infection and presently allergic to penicillin patient was empirically started on vancomycin along with cefepime admitted for further observation.  Rest of the labs show hemoglobin 14.7 platelets 286 blood glucose 191.  Review of Systems: As per HPI, rest all negative.   Past Medical History:  Diagnosis Date  . A-fib ([place])   . 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 Cataract   . Charcot-Marie-Tooth disease   . Diabetes mellitus   . Hypertension   . 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 SIRS (systemic inflammatory response syndrome) ([place]) 09/2015  . Stroke ([place])   . Weakness     Past Surgical History:  Procedure Laterality Date  . ANKLE FUSION Left   . CATARACT EXTRACTION    . CIRCUMCISION    . TOE AMPUTATION     right foot third toe   . URETHRA SURGERY       reports that he quit smoking about 27 years ago. He has a 70.00 pack-year smoking history. He quit smokeless tobacco use about 28 years ago. He reports that he does not drink alcohol or use drugs.  Allergies  Allergen Reactions  . Prevnar [Pneumococcal 13-Val Conj Vacc] Anaphylaxis  . Actos  [Pioglitazone Hydrochloride] Other (See Comments)    Afib  . Hydrochlorothiazide Other (See Comments)    Lower potassium to low  . Penicillins Rash    Tolerated cefepime, ceftriaxone  Did it involve swelling of the face/tongue/throat, SOB, or low BP? No Did it involve sudden or severe rash/hives, skin peeling, or any reaction on the inside of your mouth or nose?Yes Did you need to seek medical attention at a hospital or doctor's office. Yes When did it last happen?78 years old If all above answers are "NO", may proceed with cephalosporin use.    Family History  Problem Relation Age of Onset  . Hypertension Mother   . Hypertension Father     Prior to Admission medications   Medication Sig Start Date End Date Taking? Authorizing Provider  acetaminophen (TYLENOL) 500 MG tablet Take 500 mg by mouth every 6 (six) hours as needed for mild pain.   Yes [provider]  amLODipine (NORVASC) 10 MG tablet Take 1 tablet (10 mg total) by mouth daily. Patient taking differently: Take 5 mg by mouth 2 (two) times daily.  07/09/17  Yes Angiulli, Lavon Paganini, PA-C  aspirin EC 81 MG EC tablet Take 1 tablet (81 mg total) by mouth daily. Patient taking differently: Take 81 mg by mouth every evening.  08/19/15  Yes Florencia Reasons, MD  clopidogrel (PLAVIX) 75 MG tablet Take 75 mg by mouth daily.   Yes  [provider]  HYDROcodone-acetaminophen (NORCO/VICODIN) 5-325 MG tablet Take 1 tablet by mouth every 6 (six) hours as needed for moderate pain.   Yes [provider]  Insulin Degludec (TRESIBA) 100 UNIT/ML SOLN Inject 16-18 Units into the skin daily.    Yes [provider]  lisinopril (PRINIVIL,ZESTRIL) 2.5 MG tablet Take 3 tablets (7.5 mg total) by mouth daily. Patient taking differently: Take 2.5 mg by mouth daily.  07/09/17  Yes Angiulli, Lavon Paganini, PA-C  metoprolol tartrate (LOPRESSOR) 25 MG tablet Take 1 tablet (25 mg total) by mouth 2 (two) times daily. Patient taking  differently: Take 12.5 mg by mouth 2 (two) times daily.  07/09/17  Yes Angiulli, Lavon Paganini, PA-C  simvastatin (ZOCOR) 20 MG tablet Take 1 tablet (20 mg total) by mouth at bedtime. 07/09/17  Yes Angiulli, Lavon Paganini, PA-C    Physical Exam: Constitutional: Moderately built and nourished. Vitals:   03/21/19 0430 03/21/19 0500 03/21/19 0530 03/21/19 0600  BP: (!) 124/47 (!) 109/47 (!) 114/28 (!) 115/40  Pulse: 84 82 (!) 29 86  Resp: (!) 21 (!) 22 (!) 21 (!) 22  Temp:      TempSrc:      SpO2: 98% 96% 96% 98%   Eyes: Anicteric no pallor. ENMT: No discharge from the ears eyes nose and mouth. Neck: No mass felt.  No neck rigidity. Respiratory: No rhonchi or crepitations. Cardiovascular: S1-S2 heard. Abdomen: Soft nontender bowel sounds present. Musculoskeletal: No edema. Skin: No rash. Neurologic: Alert awake oriented to time place and person.  Moves all extremities. Psychiatric: Appears normal.   Labs on Admission: I have personally reviewed following labs and imaging studies  CBC: Recent Labs  Lab 03/20/19 2304  WBC 23.5*  NEUTROABS 20.2*  HGB 14.7  HCT 43.2  MCV 90.0  PLT [id number]   Basic Metabolic Panel: Recent Labs  Lab 03/20/19 2304  NA 138  K 4.1  CL 103  CO2 23  GLUCOSE 191*  BUN 20  CREATININE 0.84  CALCIUM 8.3*   GFR: CrCl cannot be calculated (Unknown ideal weight.). Liver Function Tests: Recent Labs  Lab 03/20/19 2304  AST 22  ALT 20  ALKPHOS 89  BILITOT 0.5  PROT 6.4*  ALBUMIN 2.5*   No results for input(s): LIPASE, AMYLASE in the last 168 hours. No results for input(s): AMMONIA in the last 168 hours. Coagulation Profile: Recent Labs  Lab 03/20/19 2304  INR 1.2   Cardiac Enzymes: No results for input(s): CKTOTAL, CKMB, CKMBINDEX, TROPONINI in the last 168 hours. BNP (last 3 results) No results for input(s): PROBNP in the last 8760 hours. HbA1C: No results for input(s): HGBA1C in the last 72 hours. CBG: No results for input(s): GLUCAP in the  last 168 hours. Lipid Profile: No results for input(s): CHOL, HDL, LDLCALC, TRIG, CHOLHDL, LDLDIRECT in the last 72 hours. Thyroid Function Tests: No results for input(s): TSH, T4TOTAL, FREET4, T3FREE, THYROIDAB in the last 72 hours. Anemia Panel: No results for input(s): VITAMINB12, FOLATE, FERRITIN, TIBC, IRON, RETICCTPCT in the last 72 hours. Urine analysis:    Component Value Date/Time   COLORURINE YELLOW 03/20/2019 2304   APPEARANCEUR CLEAR 03/20/2019 2304   LABSPEC 1.020 03/20/2019 2304   PHURINE 6.5 03/20/2019 2304   GLUCOSEU NEGATIVE 03/20/2019 2304   HGBUR LARGE (A) 03/20/2019 2304   BILIRUBINUR MODERATE (A) 03/20/2019 2304   KETONESUR 15 (A) 03/20/2019 2304   PROTEINUR >300 (A) 03/20/2019 2304   UROBILINOGEN 0.2 05/29/2014 2125   NITRITE POSITIVE (A) 03/20/2019  [place] (A) 03/20/2019 2304   Sepsis Labs: @LABRCNTIP (procalcitonin:4,lacticidven:4) )No results found for this or any previous visit (from the past 240 hour(s)).   Radiological Exams on Admission: Dg Chest Port 1 View  Result Date: 03/21/2019 CLINICAL DATA:  Sepsis EXAM: PORTABLE CHEST 1 VIEW COMPARISON:  Radiograph 02/23/2019 FINDINGS: Diffuse airways thickening with more patchy retrocardiac opacity. No radiographic features of edema. No pneumothorax or effusion. Cardiomediastinal contours including a calcified tortuous aorta are similar to prior exams. Degenerative changes are present in the imaged spine and shoulders. No acute osseous or soft tissue abnormality. IMPRESSION: 1. Diffuse airways thickening with more patchy retrocardiac opacity, which could reflect atelectasis or infiltrate. 2.  Aortic Atherosclerosis (ICD10-I70.0). Electronically Signed   By: Lovena Le M.D.   On: 03/21/2019 01:58    EKG: Independently reviewed.  Normal sinus rhythm with PVCs.  Assessment/Plan Active Problems:   B-cell lymphoma (HCC)   History of CVA (cerebrovascular accident)   SIRS (systemic inflammatory  response syndrome) (HCC)   PAF (paroxysmal atrial fibrillation) (HCC)   Type 2 diabetes mellitus with peripheral neuropathy (HCC)   Lumbar compression fracture (HCC)   UTI (urinary tract infection)    1. SIRS secondary UTI has had recurrent UTIs.  Patient will probably need referral to urology.  Probably has phimosis.  Follow cultures presently on empiric antibiotics. 2. Abnormal chest x-ray showing infiltrate but patient has no symptoms. 3. Hypertension on amlodipine beta-blockers lisinopril. 4. History of stroke on statins aspirin and Plavix. 5. History of A. fib not on anticoagulation secondary to history of bleed per the chart.  On aspirin Plavix. 6. Diabetes mellitus type 2 on Tresiba.  Will place patient on sliding scale coverage. 7. History of lymphoma in remission. 8. Lumbar compression fracture being followed by Dr. Vertell Limber.   DVT prophylaxis: Heparin. Code Status: Full code. Family Communication: Discussed with patient. Disposition Plan: Home. Consults called: None. Admission status: Observation.   Rise Patience MD [facility] Pager [phone].  If 7PM-7AM, please contact night-coverage [email or web address] Password [email or web address]  03/21/2019, 7:03 AM

## 2019-03-21 NOTE — TOC Initial Note (Signed)
Transition of Care [facility]) - Initial/Assessment Note    Patient Details  Name: [name] MRN: [id number] Date of Birth: 06-Aug-1940  [facility] CM/SW Contact:    Alexander Mt, [place] Phone Number: 03/21/2019, 3:12 PM  Clinical Narrative:                 CSW spoke with pt daughter via telephone. Introduced self, role, reason for call. Pt daughter had just left hospital. Pt from home with her, he has been getting therapy but pt daughter worries since he does not push himself and has had a depressive affect that he has gotten weaker. She hopes more regular therapy would benefit him to gain back some strength. She is amenable to SNF referrals being sent to all [place] with the exception of [facility] and [facility]. She hopes a [place] conversation with PMT regarding pt depressed affect and mood changes would be helpful before dc.   CSW will send out referrals and present pt daughter with offers.   Expected Discharge Plan: Skilled Nursing Facility Barriers to Discharge: SNF Pending bed offer, Ship broker, Continued Medical Work up, Environmental education officer)   Patient Goals and CMS Choice Patient states their goals for this hospitalization and ongoing recovery are:: for him to work with therapy and be safe CMS Medicare.gov Compare Post Acute Care list provided to:: Patient Represenative (must comment)(pt daughter [name]) Choice offered to / list presented to : Adult Children  Expected Discharge Plan and Services Expected Discharge Plan: [place] In-house Referral: Clinical Social Work, Hospice / Palliative Care Discharge Planning Services: CM Consult Post Acute Care Choice: [place] Living arrangements for the past 2 months: Single Family Home  Prior Living Arrangements/Services Living arrangements for the past 2 months: Single Family Home Lives with:: Adult Children Patient language and need for interpreter  reviewed:: Yes(no needs) Do you feel safe going back to the place where you live?: Yes      Need for Family Participation in Patient Care: Yes (Comment)(assistance with ADL/IADLs; supervision) Care giver support system in place?: Yes (comment)(adult daughter; [facility]) Current home services: DME, Home PT, Home OT Criminal Activity/Legal Involvement Pertinent to Current Situation/Hospitalization: No - Comment as needed  Activities of Daily Living Home Assistive Devices/Equipment: Wheelchair, Bedside commode/3-in-1, CBG Meter ADL Screening (condition at time of admission) Patient's cognitive ability adequate to safely complete daily activities?: No Is the patient deaf or have difficulty hearing?: No Does the patient have difficulty seeing, even when wearing glasses/contacts?: No Does the patient have difficulty concentrating, remembering, or making decisions?: No Patient able to express need for assistance with ADLs?: Yes Does the patient have difficulty dressing or bathing?: Yes Independently performs ADLs?: No Does the patient have difficulty walking or climbing stairs?: Yes Weakness of Legs: Both Weakness of Arms/Hands: None  Permission Sought/Granted Permission sought to share information with : Family Supports, Chartered certified accountant granted to share information with : Yes, Verbal Permission Granted  Share Information with NAME: [name]  Permission granted to share info w AGENCY: SNFs  Permission granted to share info w Relationship: daughter  Permission granted to share info w Contact Information: [phone]  Emotional Assessment Appearance:: Other (Comment Required(telephonic assessment with daughter) Attitude/Demeanor/Rapport: (telephonic assessment with daughter) Affect (typically observed): (telephonic assessment with daughter) Orientation: : Oriented to Self, Oriented to  Time, Oriented to Place, Fluctuating Orientation (Suspected and/or reported  Sundowners), Oriented to Situation Alcohol / Substance Use: Not Applicable Psych Involvement: No (  comment)  Admission diagnosis:  Urinary tract infection without hematuria, site unspecified [N39.0] Sepsis without acute organ dysfunction, due to unspecified organism [facility]) [A41.9] UTI (urinary tract infection) [N39.0] Patient Active Problem List   Diagnosis Date Noted  . UTI (urinary tract infection) 03/21/2019  . Weakness generalized 02/23/2019  . Lumbar compression fracture ([place]) 02/08/2019  . Closed compression fracture of L3 lumbar vertebra, initial encounter ([place]) 02/08/2019  . Laryngitis   . Acute non-recurrent sinusitis   . Sore throat   . Cough   . Overflow incontinence of urine   . Urinary retention   . Acute lower UTI   . Urinary urgency   . Hypoalbuminemia due to protein-calorie malnutrition ([place])   . 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 Benign essential HTN   . H/O urethral stricture   . Acute blood loss anemia   . Poorly controlled type 2 diabetes mellitus with peripheral neuropathy ([place])   . Debility 06/28/2017  . Personal history of urethral stricture 06/23/2017  . Rhabdomyolysis 06/23/2017  . Fever 09/23/2015  . SIRS (systemic inflammatory response syndrome) ([place]) 09/23/2015  . Near syncope 09/23/2015  . PAF (paroxysmal atrial fibrillation) ([place]) 09/23/2015  . Diabetes mellitus type 2, uncontrolled ([place]) 09/23/2015  . 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 Vertebrobasilar artery syndrome   . Orthostatic hypotension   . Insulin dependent diabetes mellitus ([place])   . Falls frequently   . Weakness 08/15/2015  . Cellulitis of right upper arm 06/02/2014  . Sepsis secondary to UTI ([place])   . Encephalopathy 05/29/2014  . Sepsis ([place]) 05/29/2014  . Lower urinary tract infectious disease 09/16/2013  . Recurrent falls 09/16/2013  . History of CVA (cerebrovascular accident) 09/16/2013  . Leukocytosis 09/16/2013  . Thrombocytopenia ([place]) 09/16/2013  .  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 Diabetes mellitus ([place]) 10/09/2011  . Hypertension 10/09/2011  . A-fib [facility])    PCP:  Alroy Dust, L.Marlou Sa, MD Pharmacy:   [facility], [address] [place] [address] Phone: [phone] Fax: [phone]  [id number] [address], [address] [place] [address] Phone: [phone] Fax: [phone]     Social Determinants of Health (SDOH) Interventions    Readmission Risk Interventions No flowsheet data found.

## 2019-03-21 NOTE — ED Notes (Signed)
In and out cath attempted by EMT without success

## 2019-03-21 NOTE — NC FL2 (Signed)
[place] LEVEL OF CARE SCREENING TOOL     IDENTIFICATION  Patient Name: [name] Birthdate: July 28, 1940 Sex: male Admission Date (Current Location): 03/20/2019  [facility] and [place] Number:  Herbalist and Address:         Provider Number: [id number]  Attending Physician Name and Address:  Geradine Girt, DO  Relative Name and Phone Number:  Thea Silversmith; daughter, [phone]    Current Level of Care: Hospital Recommended Level of Care: [place] Prior Approval Number:    Date Approved/Denied:   PASRR Number: [id number]  Discharge Plan: SNF    Current Diagnoses: Patient Active Problem List   Diagnosis Date Noted  . UTI (urinary tract infection) 03/21/2019  . Weakness generalized 02/23/2019  . Lumbar compression fracture ([place]) 02/08/2019  . Closed compression fracture of L3 lumbar vertebra, initial encounter ([place]) 02/08/2019  . Laryngitis   . Acute non-recurrent sinusitis   . Sore throat   . Cough   . Overflow incontinence of urine   . Urinary retention   . Acute lower UTI   . Urinary urgency   . Hypoalbuminemia due to protein-calorie malnutrition ([place])   . 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 Benign essential HTN   . H/O urethral stricture   . Acute blood loss anemia   . Poorly controlled type 2 diabetes mellitus with peripheral neuropathy ([place])   . Debility 06/28/2017  . Personal history of urethral stricture 06/23/2017  . Rhabdomyolysis 06/23/2017  . Fever 09/23/2015  . SIRS (systemic inflammatory response syndrome) ([place]) 09/23/2015  . Near syncope 09/23/2015  . PAF (paroxysmal atrial fibrillation) ([place]) 09/23/2015  . Diabetes mellitus type 2, uncontrolled ([place]) 09/23/2015  . 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 Vertebrobasilar artery syndrome   . Orthostatic hypotension   . Insulin dependent diabetes mellitus ([place])   . Falls frequently   . Weakness 08/15/2015  . Cellulitis of right  upper arm 06/02/2014  . Sepsis secondary to UTI ([place])   . Encephalopathy 05/29/2014  . Sepsis ([place]) 05/29/2014  . Lower urinary tract infectious disease 09/16/2013  . Recurrent falls 09/16/2013  . History of CVA (cerebrovascular accident) 09/16/2013  . Leukocytosis 09/16/2013  . Thrombocytopenia ([place]) 09/16/2013  . 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 Diabetes mellitus ([place]) 10/09/2011  . Hypertension 10/09/2011  . 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; generalized ecchymosis)                       Personal Care Assistance Level of Assistance  Bathing, Feeding, Dressing Bathing Assistance: Maximum assistance Feeding assistance: Independent Dressing Assistance: Maximum assistance     Functional Limitations Info  Sight, Hearing, Speech Sight Info: Adequate Hearing Info: Adequate Speech Info: Adequate    SPECIAL CARE FACTORS FREQUENCY  OT (By licensed OT), PT (By licensed PT)     PT Frequency: 5x week OT Frequency: 5x week            Contractures Contractures Info: Not present    Additional Factors Info  Code Status, Allergies, Insulin Sliding Scale  Code Status Info: Full Code Allergies Info: Prevnar (Pneumococcal 13-val Conj Vacc), Actos (Pioglitazone Hydrochloride) Hydrochlorothiazide, Penicillins   Insulin Sliding Scale Info: insulin aspart (novoLOG) injection 0-9 Units 3x daily with meals; insulin glargine (LANTUS) injection 12 Units daily       Current Medications (03/21/2019):  This is the current hospital active medication list Current Facility-Administered Medications  Medication  Dose Route Frequency Provider Last Rate Last Dose  . 0.9 %  sodium chloride infusion   Intravenous Continuous Radene Gunning, NP 50 mL/hr at 03/21/19 1226    . acetaminophen (TYLENOL) tablet 650 mg  650 mg Oral Q6H PRN Rise Patience, MD       Or  . acetaminophen (TYLENOL) suppository 650 mg  650 mg Rectal Q6H PRN Rise Patience, MD      . amLODipine (NORVASC) tablet 5 mg  5 mg Oral BID Rise Patience, MD   5 mg at 03/21/19 1100  . aspirin EC tablet 81 mg  81 mg Oral QPM Rise Patience, MD      . ceFEPIme (MAXIPIME) 2 g in sodium chloride 0.9 % 100 mL IVPB  2 g Intravenous Q8H Rise Patience, MD 200 mL/hr at 03/21/19 1059 2 g at 03/21/19 1059  . clopidogrel (PLAVIX) tablet 75 mg  75 mg Oral Daily Rise Patience, MD   75 mg at 03/21/19 1102  . heparin injection 5,000 Units  5,000 Units Subcutaneous Q8H Rise Patience, MD      . HYDROcodone-acetaminophen (NORCO/VICODIN) 5-325 MG per tablet 1 tablet  1 tablet Oral Q4H PRN Radene Gunning, NP      . insulin aspart (novoLOG) injection 0-9 Units  0-9 Units Subcutaneous TID WC Rise Patience, MD   2 Units at 03/21/19 1139  . insulin glargine (LANTUS) injection 12 Units  12 Units Subcutaneous Daily Rise Patience, MD      . lidocaine (LIDODERM) 5 % 1 patch  1 patch Transdermal Q24H Radene Gunning, NP   1 patch at 03/21/19 1344  . lisinopril (ZESTRIL) tablet 2.5 mg  2.5 mg Oral Daily Rise Patience, MD   2.5 mg at 03/21/19 1101  . metoprolol tartrate (LOPRESSOR) tablet 12.5 mg  12.5 mg Oral BID Rise Patience, MD   12.5 mg at 03/21/19 1100  . morphine 2 MG/ML injection 1 mg  1 mg Intravenous Q3H PRN Radene Gunning, NP   1 mg at 03/21/19 1343  . ondansetron (ZOFRAN) tablet 4 mg  4 mg Oral Q6H PRN Rise Patience, MD       Or  . ondansetron [facility] injection 4 mg  4 mg Intravenous Q6H PRN Rise Patience, MD      . simvastatin (ZOCOR) tablet 20 mg  20 mg Oral QHS Rise Patience, MD      . Derrill Memo ON 03/22/2019] vancomycin (VANCOCIN) 1,500 mg in sodium chloride 0.9 % 500 mL IVPB  1,500 mg Intravenous Q24H Rise Patience, MD         Discharge Medications: Please see discharge summary for a list of discharge medications.  Relevant Imaging Results:  Relevant Lab Results:   Additional Information SSN: [id number]  Alexander Mt, [place]

## 2019-03-21 NOTE — ED Notes (Signed)
Tele ordered bfast 

## 2019-03-21 NOTE — Progress Notes (Signed)
Pharmacy Antibiotic Note  [name] is a 78 y.o. male admitted on 03/20/2019 with UTI.  Pharmacy has been consulted for Cefepime and Vancomycin dosing. Noted pt with recent treatment (earlier this month) for enterococcus faecalis UTI (sensitive to Vanc/Amp) and aerococcus bacteremia - received Vanc x 7 days per ID recommendations.  Estimated CrCl ~60 ml/min. Wt ~78 kg  Plan: Cefepime 2gm IV q8h Vancomycin 1500mg  IV q24hrs. Goal AUC 400-550. Expected AUC: 480 SCr used: 1 Will f/u renal function, micro data, and pt's clinical condition Vanc levels prn      Temp (24hrs), Avg:99.5 F (37.5 C), Min:99.5 F (37.5 C), Max:99.5 F (37.5 C)  Recent Labs  Lab 03/20/19 2304  WBC 23.5*  CREATININE 0.84  LATICACIDVEN 1.7    CrCl cannot be calculated (Unknown ideal weight.).    Allergies  Allergen Reactions  . Prevnar [Pneumococcal 13-Val Conj Vacc] Anaphylaxis  . Actos [Pioglitazone Hydrochloride] Other (See Comments)    Afib  . Hydrochlorothiazide Other (See Comments)    Lower potassium to low  . Penicillins Rash    Tolerated cefepime, ceftriaxone  Did it involve swelling of the face/tongue/throat, SOB, or low BP? No Did it involve sudden or severe rash/hives, skin peeling, or any reaction on the inside of your mouth or nose?Yes Did you need to seek medical attention at a hospital or doctor's office. Yes When did it last happen?78 years old If all above answers are "NO", may proceed with cephalosporin use.    Antimicrobials this admission: 9/29 Cefepime >>  9/29 Vanc >>   Microbiology results: Pending  Thank you for allowing pharmacy to be a part of this patient's care.  Sherlon Handing, PharmD, BCPS CGV Clinical pharmacist phone [phone] 03/21/2019 4:27 AM

## 2019-03-21 NOTE — Progress Notes (Signed)
Patient admitted after midnight. Care began in the ED before midnight.   78 yo admitted with recurrent UTI, chest xray concerning for infiltrate and compression fracture L3 causing immobility due to pain and immobility leading to fast overall decline.    A/P 1. UTI has had recurrent UTIs. Follow urine culture. Provide vanc and cefapime. Chart review indicates recent treatment for enterococcus faecalis UTI and aerococcus bacteremia. He received vanc for 7 days per ID. If no improvement consider urology consult. Blood cultures no growth to date. He is afebrile and non-toxic appearing.  2. Abnormal chest x-ray showing infiltrate. Appears mildly sob with conversation. Breath sounds clear. Will order incentive spirometry and PT consult for mobilization 3. Hypertension. Fair control. Home meds include amlodipine beta-blockers lisinopril. 4. History of stroke on statins aspirin and Plavix. 5. History of A. fib not on anticoagulation secondary to history of bleed per the chart.  On aspirin Plavix. Rate controlled.  6. Diabetes mellitus type 2. Home meds include [place].  Will place patient on sliding scale coverage. 7. History of lymphoma in remission. 8. Lumbar compression fracture. L1 and L3 from previous fall. Moving makes pain unbearable. snf recommended last admission and he refused. He has been living with his daughter refusing to move/get up for 3 weeks. He wears pull ups. Appetite declining. Will adjust pain meds, add lidocaine patch and order PT. Long discussion with patient and daughter regarding need to mobilize and get pain controlled. Neurosurgery indicated he may benefit kyphoplasty if infection can be cleared up.    Radene Gunning, NP

## 2019-03-22 DIAGNOSIS — C8520 Mediastinal (thymic) large B-cell lymphoma, unspecified site: Secondary | ICD-10-CM

## 2019-03-22 DIAGNOSIS — Z8673 Personal history of transient ischemic attack (TIA), and cerebral infarction without residual deficits: Secondary | ICD-10-CM

## 2019-03-22 DIAGNOSIS — Z515 Encounter for palliative care: Secondary | ICD-10-CM

## 2019-03-22 DIAGNOSIS — Z7189 Other specified counseling: Secondary | ICD-10-CM

## 2019-03-22 DIAGNOSIS — S32010A Wedge compression fracture of first lumbar vertebra, initial encounter for closed fracture: Secondary | ICD-10-CM

## 2019-03-22 DIAGNOSIS — I480 Paroxysmal atrial fibrillation: Secondary | ICD-10-CM

## 2019-03-22 DIAGNOSIS — S32030S Wedge compression fracture of third lumbar vertebra, sequela: Secondary | ICD-10-CM

## 2019-03-22 LAB — BASIC METABOLIC PANEL
Anion gap: 6 (ref 5–15)
BUN: 22 mg/dL (ref 8–23)
CO2: 23 mmol/L (ref 22–32)
Calcium: 7.6 mg/dL — ABNORMAL LOW (ref 8.9–10.3)
Chloride: 107 mmol/L (ref 98–111)
Creatinine, Ser: 0.82 mg/dL (ref 0.61–1.24)
GFR calc Af Amer: >60 mL/min (ref >60)
GFR calc non Af Amer: >60 mL/min (ref >60)
Glucose, Bld: 199 mg/dL — ABNORMAL HIGH (ref 70–99)
Potassium: 4.2 mmol/L (ref 3.5–5.1)
Sodium: 136 mmol/L (ref 135–145)

## 2019-03-22 LAB — CBC
HCT: 36.4 % — ABNORMAL LOW (ref 39.0–52.0)
Hemoglobin: 11.7 g/dL — ABNORMAL LOW (ref 13.0–17.0)
MCH: 29.5 pg (ref 26.0–34.0)
MCHC: 32.1 g/dL (ref 30.0–36.0)
MCV: 91.7 fL (ref 80.0–100.0)
Platelets: 226 10*3/uL (ref 150–400)
RBC: 3.97 MIL/uL — ABNORMAL LOW (ref 4.22–5.81)
RDW: 13.5 % (ref 11.5–15.5)
WBC: 15 10*3/uL — ABNORMAL HIGH (ref 4.0–10.5)
nRBC: 0 % (ref 0.0–0.2)

## 2019-03-22 LAB — GLUCOSE, CAPILLARY
Glucose-Capillary: 122 mg/dL — ABNORMAL HIGH (ref 70–99)
Glucose-Capillary: 170 mg/dL — ABNORMAL HIGH (ref 70–99)
Glucose-Capillary: 140 mg/dL — ABNORMAL HIGH (ref 70–99)
Glucose-Capillary: 150 mg/dL — ABNORMAL HIGH (ref 70–99)
Glucose-Capillary: 281 mg/dL — ABNORMAL HIGH (ref 70–99)

## 2019-03-22 MED ORDER — ZOLPIDEM TARTRATE 5 MG PO TABS
5.0000 mg | ORAL_TABLET | Freq: Every evening | ORAL | Status: DC | PRN
  Filled 2019-03-22: qty 1

## 2019-03-22 NOTE — Progress Notes (Signed)
PROGRESS NOTE    [name]  [id number] DOB: 18-Aug-1940 DOA: 03/20/2019 PCP: Alroy Dust, L.Marlou Sa, MD   Brief Narrative:  78 yo admitted with recurrent UTI, chest xray concerning for infiltrate and compression fracture L3 causing immobility due to pain and immobility leading to fast overall decline.    Assessment & Plan:   Principal Problem:   SIRS (systemic inflammatory response syndrome) (HCC) Active Problems:   B-cell lymphoma (HCC)   History of CVA (cerebrovascular accident)   PAF (paroxysmal atrial fibrillation) (HCC)   Type 2 diabetes mellitus with peripheral neuropathy (HCC)   Lumbar compression fracture (HCC)   UTI (urinary tract infection)   1. UTI has had recurrent UTIs. Follow urine culture. Provide vanc and cefapime. Chart review indicates recent treatment for enterococcus faecalis UTI and aerococcus bacteremia. He received vanc for 7 days per ID. If no improvement consider urology consult. Blood cultures no growth to date. He is afebrile and non-toxic appearing.  2. Abnormal chest x-ray showing infiltrate. Appears mildly sob with conversation. Breath sounds clear. Will order incentive spirometry and PT consult for mobilization 3. Hypertension. Fair control. Home meds include amlodipine beta-blockers lisinopril. 4. History of stroke on statins aspirin and Plavix. 5. History of A. fib not on anticoagulation secondary to history of bleed per the chart. On aspirin Plavix. Rate controlled.  6. Diabetes mellitus type 2. Home meds include [place]. Will place patient on sliding scale coverage. 7. History of lymphoma in remission. 8. Lumbar compression fracture. L1 and L3 from previous fall. Moving makes pain unbearable. snf recommended last admission and he refused. He has been living with his daughter refusing to move/get up for 3 weeks. He wears pull ups. Appetite declining. Will adjust pain meds, add lidocaine patch and order PT. Long discussion with patient and daughter  regarding need to mobilize and get pain controlled. Neurosurgery indicated he may benefit kyphoplasty if infection can be cleared up.   DVT prophylaxis: Heparin SQ  Code Status: full    Code Status Orders  (From admission, onward)         Start     Ordered   03/21/19 0702  Full code  Continuous     03/21/19 0702        Code Status History    Date Active Date Inactive Code Status Order ID Comments User Context   02/23/2019 2253 03/03/2019 1744 Full Code [id number]  Elwyn Reach, MD Inpatient   02/09/2019 0022 02/10/2019 1816 Full Code [id number]  Rise Patience, MD Inpatient   06/28/2017 1703 07/09/2017 1441 Full Code [id number]  Cathlyn Parsons, PA-C Inpatient   06/28/2017 1703 06/28/2017 1703 Full Code [id number]  Cathlyn Parsons, PA-C Inpatient   06/23/2017 0026 06/28/2017 1641 Full Code [id number]  Norval Morton, MD ED   09/23/2015 1001 09/25/2015 1911 Full Code [id number]  Rise Patience, MD ED   08/15/2015 1941 08/19/2015 1621 Full Code [id number]  Elgergawy, Silver Huguenin, MD Inpatient   05/29/2014 2351 06/05/2014 1702 Full Code [id number]  Shanda Howells, MD ED   09/16/2013 1605 09/18/2013 1858 Full Code [id number]  Caren Griffins, MD ED   Advance Care Planning Activity    Advance Directive Documentation     Most Recent Value  Type of Advance Directive  Living will  Pre-existing out of facility DNR order (yellow form or pink MOST form)  --  "MOST" Form in Place?  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  There is no safe discharge plan at this point time Consults called: None Admission status: Inpatient   Consultants:   None  Procedures:  Mr Lumbar Spine W Wo Contrast  Result Date: 02/23/2019 CLINICAL DATA:  Back pain.  Recent fall. EXAM:  MRI LUMBAR SPINE WITHOUT AND WITH CONTRAST TECHNIQUE: Multiplanar and multiecho pulse sequences of the lumbar spine were obtained without and with intravenous contrast. CONTRAST:  7.5 mL Gadavist intravenous contrast. COMPARISON:  CT lumbar spine from same day. FINDINGS: Segmentation: Transitional lumbosacral anatomy again identified with fully lumbarized S1 segment, in keeping with numbering system used on prior studies. Alignment:  Physiologic. Vertebrae: Acute minimally depressed compression fractures of the L1 superior endplate and L3 inferior endplate with associated marrow edema and enhancement. Additional acute nondisplaced fracture of the lower sacrum at the S4-S5 level. No evidence of discitis. No focal bone lesion. Conus medullaris and cauda equina: Conus extends to the L1 level. Conus and cauda equina appear normal. No intradural enhancement. Paraspinal and other soft tissues: Negative. Disc levels: T12-L1:  Negative. L1-L2:  Negative. L2-L3:  Negative. L3-L4:  Mild disc bulging.  No stenosis. L4-L5: Mild disc bulging with superimposed broad-based left foraminal disc protrusion encroaching on the exiting left L4 nerve root. Mild bilateral facet arthropathy. Mild left neuroforaminal stenosis. No spinal canal or right neuroforaminal stenosis. L5-S1: Mild disc bulging, endplate spurring, and bilateral facet arthropathy. Mild to moderate right lateral recess stenosis. Mild left greater than right neuroforaminal stenosis. No spinal canal stenosis. S1-S2: Negative. IMPRESSION: 1. Acute minimally depressed compression fractures of the L1 superior endplate and L3 inferior endplate. No retropulsion. 2. Additional acute nondisplaced fracture of the lower sacrum. 3. Mild spondylosis at L4-L5 and L5-S1 as described above. Electronically Signed   By: Titus Dubin M.D.   On: 02/23/2019 15:11   Ct Abdomen Pelvis W Contrast  Result Date: 02/23/2019 CLINICAL DATA:  78 year old male with abdominal pain. EXAM: CT  ABDOMEN AND PELVIS WITH CONTRAST TECHNIQUE: Multidetector CT imaging of the abdomen and pelvis was performed using the standard protocol following bolus administration of intravenous contrast. CONTRAST:  156mL OMNIPAQUE IOHEXOL 300 MG/ML  SOLN COMPARISON:  Lumbar spine CT dated 02/23/2019 and CT of the abdomen pelvis dated 10/07/2012. FINDINGS: Lower chest: Minimal bibasilar linear atelectasis/scarring. The visualized lung bases are clear. Advanced multi vessel coronary vascular calcification noted. No intra-abdominal free air or free fluid. Hepatobiliary: The liver is unremarkable. No intrahepatic biliary ductal dilatation. Multiple small stones noted within the gallbladder. No pericholecystic fluid or evidence of acute cholecystitis by CT. Ultrasound may provide better evaluation if there is clinical concern for acute cholecystitis. Pancreas: Unremarkable. No pancreatic ductal dilatation or surrounding inflammatory changes. Spleen: There is a 1 cm calcified splenic granuloma. Several additional splenic hypodense lesions are not characterized but may represent cysts or hemangioma. Adrenals/Urinary Tract: The adrenal glands are unremarkable. There is no hydronephrosis on either side. There is symmetric enhancement and excretion of contrast by both kidneys. Bilateral renal cysts and smaller hypodense lesions which are too small to characterize. The largest cyst measures approximately 15 mm in the upper pole the right kidney. The visualized ureters appear unremarkable. There is trabeculated appearance of the bladder wall likely related to chronic bladder outlet obstruction. Stomach/Bowel: There is no bowel obstruction or active inflammation. The appendix is normal. Vascular/Lymphatic: Advanced aortoiliac atherosclerotic  disease. The IVC is unremarkable. No portal venous gas. There is no adenopathy. Reproductive: Enlarged prostate gland measuring 5 cm in transverse axial diameter. Other: None Musculoskeletal:  Osteopenia with degenerative changes of the spine. Fracture of the inferior endplate of the L3 extending to the anterior cortex as seen on the lumbar spine CT of table 02/08/2019. No new fracture. IMPRESSION: 1. No acute intra-abdominal or pelvic pathology. No bowel obstruction or active inflammation. Normal appendix. 2. Cholelithiasis. 3. L3 inferior endplate compression fracture. 4. Advanced aortic Atherosclerosis (ICD10-I70.0). Electronically Signed   By: Anner Crete M.D.   On: 02/23/2019 11:01   Ct L-spine No Charge  Result Date: 02/23/2019 CLINICAL DATA:  78 year old male with back pain. EXAM: CT LUMBAR SPINE WITHOUT CONTRAST TECHNIQUE: Multidetector CT imaging of the lumbar spine was performed without intravenous contrast administration. Multiplanar CT image reconstructions were also generated. COMPARISON:  Lumbar spine CT dated 02/08/2019 and CT of the abdomen pelvis dated 02/23/2019 FINDINGS: Segmentation: 6 lumbar type vertebra. The lowermost vertebra is designated as a lumbarized S1. Alignment: No acute subluxation. Vertebrae: Fracture inferior endplate of the L3 extending into the anterior vertebral body cortex. This has not significantly changed since the prior CT. No retropulsion. No other acute fracture. The bones are osteopenic. Paraspinal and other soft tissues: Negative. Disc levels: Multilevel degenerative disc disease. There is disc desiccation and vacuum phenomena at L4-L5 and L5-S1. The neural foramina remain patent. Mild diffuse disc bulge at L3-L4, L4-5, and L5-S1. IMPRESSION: 1. No new fracture. Fracture of the inferior endplate of L3 extending into the anterior vertebral body cortex, unchanged compared to the prior CT. No retropulsion. 2. Osteopenia with multilevel degenerative disc disease and mild diffuse disc bulge at L3-L4, L4-5, and L5-S1. Electronically Signed   By: Anner Crete M.D.   On: 02/23/2019 10:54   Dg Chest Port 1 View  Result Date: 03/21/2019 CLINICAL DATA:   Sepsis EXAM: PORTABLE CHEST 1 VIEW COMPARISON:  Radiograph 02/23/2019 FINDINGS: Diffuse airways thickening with more patchy retrocardiac opacity. No radiographic features of edema. No pneumothorax or effusion. Cardiomediastinal contours including a calcified tortuous aorta are similar to prior exams. Degenerative changes are present in the imaged spine and shoulders. No acute osseous or soft tissue abnormality. IMPRESSION: 1. Diffuse airways thickening with more patchy retrocardiac opacity, which could reflect atelectasis or infiltrate. 2.  Aortic Atherosclerosis (ICD10-I70.0). Electronically Signed   By: Lovena Le M.D.   On: 03/21/2019 01:58   Dg Chest Port 1 View  Result Date: 02/23/2019 CLINICAL DATA:  Weakness. EXAM: PORTABLE CHEST 1 VIEW COMPARISON:  Radiograph of July 05, 2017. FINDINGS: The heart size and mediastinal contours are within normal limits. Atherosclerosis of thoracic aorta is noted. Hypoinflation of the lungs is noted with minimal bibasilar subsegmental atelectasis. No pneumothorax or pleural effusion is noted. The visualized skeletal structures are unremarkable. IMPRESSION: Hypoinflation of the lungs with minimal bibasilar subsegmental atelectasis. Aortic Atherosclerosis (ICD10-I70.0). Electronically Signed   By: Marijo Conception M.D.   On: 02/23/2019 09:50   US Abdomen Limited Ruq  Result Date: 02/23/2019 CLINICAL DATA:  Abdominal pain.  Gallstones on CT earlier this day. EXAM: ULTRASOUND ABDOMEN LIMITED RIGHT UPPER QUADRANT COMPARISON:  Abdominal CT earlier this day. FINDINGS: Gallbladder: Physiologically distended with intraluminal gallstone. Normal wall thickness of 2 mm. No pericholecystic fluid. No sonographic Murphy sign noted by sonographer. Common bile duct: Diameter: 6 mm, normal. Liver: No focal lesion identified. Within normal limits in parenchymal echogenicity. Portal vein is patent on color Doppler imaging with  normal direction of blood flow towards the liver. Other:  None. IMPRESSION: Gallstones without sonographic findings of acute cholecystitis. No biliary dilatation. Electronically Signed   By: Keith Rake M.D.   On: 02/23/2019 19:27     Antimicrobials:   Cefepime and vancomycin   Subjective: Patient remains in bed, reports feeling sore limiting mobility  Objective: Vitals:   03/21/19 1723 03/21/19 2136 03/22/19 0506 03/22/19 0922  BP: (!) 139/57 (!) 131/49 139/60 (!) 152/74  Pulse: 87 80 70 74  Resp: 20 16 17    Temp: 98.6 F (37 C) 98.2 F (36.8 C) 98.3 F (36.8 C)   TempSrc: Oral Oral Oral   SpO2: 94% 98% 97%   Weight:      Height:        Intake/Output Summary (Last 24 hours) at 03/22/2019 0941 Last data filed at 03/22/2019 0500 Gross per 24 hour  Intake 1700.6 ml  Output --  Net 1700.6 ml   Filed Weights   03/21/19 1222  Weight: 77.2 kg    Examination:  General exam: Appears calm reports generalized pain chronic known from L1-L2 compression fracture Respiratory system: Clear to auscultation. Respiratory effort normal. Cardiovascular system: S1 & S2 heard, RRR. No JVD, murmurs, rubs, gallops or clicks. No pedal edema. Gastrointestinal system: Abdomen is nondistended, soft and nontender. No organomegaly or masses felt. Normal bowel sounds heard. Central nervous system: Alert and oriented. No focal neurological deficits although exam limited secondary pain as below Extremities: Pain with range of motion, warm well perfused Skin: No rashes, lesions or ulcers Psychiatry: Judgement and insight appear normal. Mood & affect appropriate.     Data Reviewed: I have personally reviewed following labs and imaging studies  CBC: Recent Labs  Lab 03/20/19 2304 03/21/19 0750 03/22/19 0235  WBC 23.5* 21.0* 15.0*  NEUTROABS 20.2* 16.8*  --   HGB 14.7 13.1 11.7*  HCT 43.2 40.8 36.4*  MCV 90.0 92.1 91.7  PLT 286 272 [id number]   Basic Metabolic Panel: Recent Labs  Lab 03/20/19 2304 03/21/19 0750 03/22/19 0235  NA 138 141 136   K 4.1 3.9 4.2  CL 103 108 107  CO2 23 25 23   GLUCOSE 191* 101* 199*  BUN 20 22 22   CREATININE 0.84 0.90 0.82  CALCIUM 8.3* 8.0* 7.6*   GFR: Estimated Creatinine Clearance: 75.4 mL/min (by C-G formula based on SCr of 0.82 mg/dL). Liver Function Tests: Recent Labs  Lab 03/20/19 2304 03/21/19 0750  AST 22 17  ALT 20 17  ALKPHOS 89 77  BILITOT 0.5 0.8  PROT 6.4* 5.6*  ALBUMIN 2.5* 2.2*   No results for input(s): LIPASE, AMYLASE in the last 168 hours. No results for input(s): AMMONIA in the last 168 hours. Coagulation Profile: Recent Labs  Lab 03/20/19 2304  INR 1.2   Cardiac Enzymes: No results for input(s): CKTOTAL, CKMB, CKMBINDEX, TROPONINI in the last 168 hours. BNP (last 3 results) No results for input(s): PROBNP in the last 8760 hours. HbA1C: No results for input(s): HGBA1C in the last 72 hours. CBG: Recent Labs  Lab 03/21/19 0746 03/21/19 1115 03/21/19 1623 03/21/19 2134 03/22/19 0632  GLUCAP 88 167* 151* 141* 122*   Lipid Profile: No results for input(s): CHOL, HDL, LDLCALC, TRIG, CHOLHDL, LDLDIRECT in the last 72 hours. Thyroid Function Tests: No results for input(s): TSH, T4TOTAL, FREET4, T3FREE, THYROIDAB in the last 72 hours. Anemia Panel: No results for input(s): VITAMINB12, FOLATE, FERRITIN, TIBC, IRON, RETICCTPCT in the last 72 hours. Sepsis Labs: Recent Labs  Lab 03/20/19 2304 03/21/19 1130  LATICACIDVEN 1.7 1.4    Recent Results (from the past 240 hour(s))  Blood Culture (routine x 2)     Status: None (Preliminary result)   Collection Time: 03/20/19 10:18 PM   Specimen: BLOOD RIGHT FOREARM  Result Value Ref Range Status   Specimen Description BLOOD RIGHT FOREARM  Final   Special Requests   Final    BOTTLES DRAWN AEROBIC AND ANAEROBIC Blood Culture adequate volume   Culture   Final    NO GROWTH 2 DAYS Performed at [facility], [place] [address], [place], [place] [address]    Report Status PENDING  Incomplete  Blood Culture  (routine x 2)     Status: None (Preliminary result)   Collection Time: 03/20/19 10:30 PM   Specimen: BLOOD LEFT FOREARM  Result Value Ref Range Status   Specimen Description BLOOD LEFT FOREARM  Final   Special Requests   Final    BOTTLES DRAWN AEROBIC AND ANAEROBIC Blood Culture adequate volume   Culture   Final    NO GROWTH 2 DAYS Performed at [facility], [place] [address], [place], [place] [address]    Report Status PENDING  Incomplete  SARS CORONAVIRUS 2 (TAT 6-24 HRS) Nasopharyngeal Nasopharyngeal Swab     Status: None   Collection Time: 03/21/19  1:16 AM   Specimen: Nasopharyngeal Swab  Result Value Ref Range Status   SARS Coronavirus 2 NEGATIVE NEGATIVE Final    Comment: (NOTE) SARS-CoV-2 target nucleic acids are NOT DETECTED. The SARS-CoV-2 RNA is generally detectable in upper and lower respiratory specimens during the acute phase of infection. Negative results do not preclude SARS-CoV-2 infection, do not rule out co-infections with other pathogens, and should not be used as the sole basis for treatment or other patient management decisions. Negative results must be combined with clinical observations, patient history, and epidemiological information. The expected result is Negative. Fact Sheet for Patients: SugarRoll.be Fact Sheet for Healthcare Providers: https://www.woods-mathews.com/ This test is not yet approved or cleared by the [place] FDA and  has been authorized for detection and/or diagnosis of SARS-CoV-2 by FDA under an Emergency Use Authorization (EUA). This EUA will remain  in effect (meaning this test can be used) for the duration of the COVID-19 declaration under Section 56 4(b)(1) of the Act, 21 U.S.C. section 360bbb-3(b)(1), unless the authorization is terminated or revoked sooner. Performed at [facility], [place] [address] [place], [place] [address]   Urine culture     Status: Abnormal  (Preliminary result)   Collection Time: 03/21/19  1:45 AM   Specimen: In/Out Cath Urine  Result Value Ref Range Status   Specimen Description IN/OUT CATH URINE  Final   Special Requests   Final    NONE Performed at [facility], [place] [address] [place], [place] [address]    Culture >=100,000 COLONIES/mL GRAM NEGATIVE RODS (A)  Final   Report Status PENDING  Incomplete         Radiology Studies: Dg Chest Port 1 View  Result Date: 03/21/2019 CLINICAL DATA:  Sepsis EXAM: PORTABLE CHEST 1 VIEW COMPARISON:  Radiograph 02/23/2019 FINDINGS: Diffuse airways thickening with more patchy retrocardiac opacity. No radiographic features of edema. No pneumothorax or effusion. Cardiomediastinal contours including a calcified tortuous aorta are similar to prior exams. Degenerative changes are present in the imaged spine and shoulders. No acute osseous or soft tissue abnormality. IMPRESSION: 1. Diffuse airways thickening with more patchy retrocardiac  opacity, which could reflect atelectasis or infiltrate. 2.  Aortic Atherosclerosis (ICD10-I70.0). Electronically Signed   By: Lovena Le M.D.   On: 03/21/2019 01:58        Scheduled Meds:  amLODipine  5 mg Oral BID   aspirin EC  81 mg Oral QPM   clopidogrel  75 mg Oral Daily   heparin  5,000 Units Subcutaneous Q8H   insulin aspart  0-9 Units Subcutaneous TID WC   insulin glargine  12 Units Subcutaneous Daily   lidocaine  1 patch Transdermal Q24H   lisinopril  2.5 mg Oral Daily   metoprolol tartrate  12.5 mg Oral BID   simvastatin  20 mg Oral QHS   Continuous Infusions:  ceFEPime (MAXIPIME) IV 2 g (03/22/19 [id number])   vancomycin 1,500 mg (03/21/19 2329)     LOS: 1 day    Time spent: 35 min    Nicolette Bang, MD [facility]  If 7PM-7AM, please contact night-coverage  03/22/2019, 9:41 AM

## 2019-03-22 NOTE — Evaluation (Signed)
Occupational Therapy Evaluation Patient Details Name: [name] MRN: [id number] DOB: April 20, 1941 Today's Date: 03/22/2019    History of Present Illness Pt is a 78 y/o male admitted secondary to urinary pain and incontinence. Found to have SIRS secondary to UTI. Pt also with recent fall and L1 and L3 compression fx. PMH includes CVA, a fib, HTN, DM.    Clinical Impression   Pt admitted with recent fall. Pt currently with functional limitations due to the deficits listed below (see OT Problem List).  Pt will benefit from skilled OT to increase their safety and independence with ADL and functional mobility for ADL to facilitate discharge to venue listed below.   Pt plans to DC to SNF- will defer further OT to SNF. Pt agrees.     Follow Up Recommendations  Supervision/Assistance - 24 hour;SNF    Equipment Recommendations  None recommended by OT    Recommendations for Other Services       Precautions / Restrictions Precautions Precautions: Fall;Back Precaution Booklet Issued: No Precaution Comments: Verbally reviewed back precautions.  Required Braces or Orthoses: Spinal Brace Spinal Brace: Lumbar corset;Applied in sitting position Restrictions Weight Bearing Restrictions: No      Mobility Bed Mobility Overal bed mobility: Needs Assistance Bed Mobility: Sidelying to Sit;Sit to Sidelying;Rolling Rolling: Min assist Sidelying to sit: Mod assist     Sit to sidelying: Mod assist General bed mobility comments: Mod A for trunk elevation and LE assist to sit at EOB. Required increased time secondary to pain. Mod A for LE assist for return to sidelying.   Transfers Overall transfer level: Needs assistance Equipment used: Rolling walker (2 wheeled) Transfers: Sit to/from Stand Sit to Stand: Min assist;From elevated surface;Mod assist              Balance Overall balance assessment: Needs assistance Sitting-balance support: Feet supported;Bilateral upper extremity  supported Sitting balance-Leahy Scale: Poor Sitting balance - Comments: Reliant on BUE support to maintain sitting balance.    Standing balance support: Bilateral upper extremity supported;During functional activity Standing balance-Leahy Scale: Poor Standing balance comment: Heavy reliance on UE support to maintain balance.                            ADL either performed or assessed with clinical judgement   ADL Overall ADL's : Needs assistance/impaired     Grooming: Wash/dry face;Minimal assistance;Sitting                       Toileting- Clothing Manipulation and Hygiene: Maximal assistance;Adhering to back precautions;Sit to/from stand Toileting - Clothing Manipulation Details (indicate cue type and reason): stand EOB       General ADL Comments: pt sat EOB with OT and stood EOB. Pt did need MAX encouragement but did well!     Vision Patient Visual Report: No change from baseline              Pertinent Vitals/Pain Pain Score: 4  Pain Location: Back Pain Descriptors / Indicators: Grimacing;Guarding Pain Intervention(s): Limited activity within patient's tolerance;Monitored during session;Repositioned     Hand Dominance     Extremity/Trunk Assessment Upper Extremity Assessment Upper Extremity Assessment: Generalized weakness           Communication Communication Communication: No difficulties   Cognition Arousal/Alertness: Awake/alert Behavior During Therapy: WFL for tasks assessed/performed Overall Cognitive Status: Within Functional Limits for tasks assessed  Home Living Family/patient expects to be discharged to:: Skilled nursing facility Living Arrangements: Children Available Help at Discharge: Family;Available 24 hours/day Type of Home: House Home Access: Stairs to enter [employer] of Steps: 4 Entrance Stairs-Rails: Right Home Layout: One level      Bathroom Shower/Tub: Tub/shower unit;Curtain   Biochemist, clinical: Standard     Home Equipment: Cane - single point;Cane - quad;Walker - 2 wheels;Wheelchair - manual;Bedside commode;Walker - 4 wheels          Prior Functioning/Environment Level of Independence: Needs assistance  Gait / Transfers Assistance Needed: Since d/c from hospital, has been staying in the bed. Only gets out of the bed when PT is there.  ADL's / Homemaking Assistance Needed: Requires assist for ADL tasks.             OT Problem List: Decreased range of motion;Impaired balance (sitting and/or standing);Pain      OT Treatment/Interventions: Self-care/ADL training;DME and/or AE instruction;Patient/family education;Balance training    OT Goals(Current goals can be found in the care plan section) Acute Rehab OT Goals Patient Stated Goal: to decrease pain  OT Frequency: Min 2X/week    AM-PAC OT "6 Clicks" Daily Activity     Outcome Measure Help from another person eating meals?: None Help from another person taking care of personal grooming?: A Lot Help from another person toileting, which includes using toliet, bedpan, or urinal?: A Lot Help from another person bathing (including washing, rinsing, drying)?: A Lot Help from another person to put on and taking off regular upper body clothing?: A Lot Help from another person to put on and taking off regular lower body clothing?: A Lot 6 Click Score: 14   End of Session Equipment Utilized During Treatment: Rolling walker;Gait belt;Back brace  Activity Tolerance: Patient tolerated treatment well Patient left: with call bell/phone within reach;Other (comment);in bed;with bed alarm set(RN present cleaning up incontient BM)  OT Visit Diagnosis: Other abnormalities of gait and mobility (R26.89);Unsteadiness on feet (R26.81);Pain                Time: [id number] OT Time Calculation (min): 26 min Charges:  OT General Charges $OT Visit: 1 Visit OT Evaluation $OT  Eval Moderate Complexity: 1 Mod OT Treatments $Self Care/Home Management : 8-22 mins  Kari Baars, OT Acute Rehabilitation Services [phone] Office- [phone]     Elihu Milstein, Edwena Felty D 03/22/2019, 3:26 PM

## 2019-03-22 NOTE — Progress Notes (Deleted)
OT Cancellation Note  Patient Details Name: SU AMANO MRN: [id number] DOB: 01/15/1941   Cancelled Treatment:    Reason Eval/Treat Not Completed: Other (comment)  Noted plan for SNF  Will defer OT eval to SNF Thanks. Kari Baars, OT Acute Rehabilitation Services [phone] Office- [phone], Edwena Felty D 03/22/2019, 11:08 AM

## 2019-03-22 NOTE — Consult Note (Signed)
Consultation Note Date: 03/22/2019   Patient Name: [name]  DOB: 09/22/1940  MRN: [id number]  Age / Sex: 78 y.o., male  PCP: Alroy Dust, L.Marlou Sa, MD Referring Physician: Marcell Anger*  Reason for Consultation: Establishing goals of care  HPI/Patient Profile: 78 y.o. male admitted on 03/20/2019    Clinical Assessment and Goals of Care:   78 yo gentleman who lives at home with his daughter [name] and her family in [place], [place]. The patient has a past medical history significant for HTN, CVA, A fib, neuropathy, DM2. He recently has had falls, UTI, sacral fracture and lumbar compression fractures.   A palliative consult has been requested for additional and ongoing goals of care discussions.   The patient is in good spirits, resting in bed. I introduced myself and palliative care as follows: Palliative medicine is specialized medical care for people living with serious illness. It focuses on providing relief from the symptoms and stress of a serious illness. The goal is to improve quality of life for both the patient and the family.  Goals of care: Broad aims of medical therapy in relation to the patient's values and preferences. Our aim is to provide medical care aimed at enabling patients to achieve the goals that matter most to them, given the circumstances of their particular medical situation and their constraints.   Brief life review performed: Patient's wife died 3 years ago, he states he has tried very hard to manage his diabetes well and regularly checks his blood sugars. He lives in his daughter's home with her family. He fell in his bathroom a few weeks ago, has had a UTI, has had his mobility severely restricted since then.   Patient states he has seen Dr Vertell Limber, he is looking forward to when he can have a procedure for his compression fractures. He wants to "keep going". He  hopes he can get back to his baseline, prior to this fall and compression fractures. He has participated with PT, knows that he is being considered for a rehab attempt. He has developed a fear of falling, has become more immobile. He reads his devotions on his phone every morning, states it is a source of comfort to him.   See additional discussions below. Thank you for the consult.   NEXT OF KIN Was living at home with daughter Thea Silversmith at [phone].    SUMMARY OF RECOMMENDATIONS    full code, full scope Recommend SNF rehab with palliative care to follow Patient remains invested in continuing with current mode of care, hopes to have back surgery in the near future.  Continue current pain and non pain symptom management, patient states his pain is reasonably well controlled, as long as he is resting in bed.  Thank you for the consult.  Code Status/Advance Care Planning:  Full code    Symptom Management:      Palliative Prophylaxis:   Delirium Protocol  Additional Recommendations (Limitations, Scope, Preferences):  Full Scope Treatment  Psycho-social/Spiritual:   Desire  for further Chaplaincy support:yes  Additional Recommendations: Caregiving  Support/Resources  Prognosis:   Unable to determine  Discharge Planning: [place] for rehab with Palliative care service follow-up      Primary Diagnoses: Present on Admission:  UTI (urinary tract infection)  Type 2 diabetes mellitus with peripheral neuropathy (HCC)  SIRS (systemic inflammatory response syndrome) (HCC)  PAF (paroxysmal atrial fibrillation) (HCC)  Lumbar compression fracture ([place])  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 The following aspects are pertinent.  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; xrt comp 03/2010   SIRS (systemic inflammatory response syndrome) ([place]) 09/2015   Stroke ([place])    Weakness    Social History   Socioeconomic History   Marital status: Widowed    Spouse name: Not on file   Number of children: Not on file   Years of education: Not on file   Highest education level: Not on file  Occupational History   Not on file  Social Needs   Financial resource strain: Not on file   Food insecurity    Worry: Not on file    Inability: Not on file   Transportation needs    Medical: Not on file    Non-medical: Not on file  Tobacco Use   Smoking status: Former Smoker    Packs/day: 2.00    Years: 35.00    Pack years: 70.00    Quit date: 06/03/1991    Years since quitting: 27.8   Smokeless tobacco: Former Systems developer    Quit date: 06/26/1990  Substance and Sexual Activity   Alcohol use: No   Drug use: No   Sexual activity: Not on file  Lifestyle   Physical activity    Days per week: Not on file    Minutes per session: Not on file   Stress: Not on file  Relationships   Social connections    Talks on phone: Not on file    Gets together: Not on file    Attends religious service: Not on file    Active member of club or organization: Not on file    Attends meetings of clubs or organizations: Not on file    Relationship status: Not on file  Other Topics Concern   Not on file  Social History Narrative   Not on file   Family History  Problem Relation Age of Onset   Hypertension Mother    Hypertension Father    Scheduled Meds:  amLODipine  5 mg Oral BID   aspirin EC  81 mg Oral QPM   clopidogrel  75 mg Oral Daily   heparin  5,000 Units Subcutaneous Q8H   insulin aspart  0-9 Units Subcutaneous TID WC   insulin glargine  12 Units Subcutaneous Daily   lidocaine  1 patch Transdermal Q24H    lisinopril  2.5 mg Oral Daily   metoprolol tartrate  12.5 mg Oral BID   simvastatin  20 mg Oral QHS   Continuous Infusions:  ceFEPime (MAXIPIME) IV 2 g (03/22/19 [id number])   vancomycin 1,500 mg (03/21/19 2329)   PRN Meds:.acetaminophen **OR** acetaminophen, HYDROcodone-acetaminophen, morphine injection, ondansetron **OR** ondansetron (  ZOFRAN) IV, zolpidem Medications Prior to Admission:  Prior to Admission medications   Medication Sig Start Date End Date Taking? Authorizing Provider  acetaminophen (TYLENOL) 500 MG tablet Take 500 mg by mouth every 6 (six) hours as needed for mild pain.   Yes [provider]  amLODipine (NORVASC) 10 MG tablet Take 1 tablet (10 mg total) by mouth daily. Patient taking differently: Take 5 mg by mouth 2 (two) times daily.  07/09/17  Yes Angiulli, Lavon Paganini, PA-C  aspirin EC 81 MG EC tablet Take 1 tablet (81 mg total) by mouth daily. Patient taking differently: Take 81 mg by mouth every evening.  08/19/15  Yes Florencia Reasons, MD  clopidogrel (PLAVIX) 75 MG tablet Take 75 mg by mouth daily.   Yes [provider]  HYDROcodone-acetaminophen (NORCO/VICODIN) 5-325 MG tablet Take 1 tablet by mouth every 6 (six) hours as needed for moderate pain.   Yes [provider]  Insulin Degludec (TRESIBA) 100 UNIT/ML SOLN Inject 16-18 Units into the skin daily.    Yes [provider]  lisinopril (PRINIVIL,ZESTRIL) 2.5 MG tablet Take 3 tablets (7.5 mg total) by mouth daily. Patient taking differently: Take 2.5 mg by mouth daily.  07/09/17  Yes Angiulli, Lavon Paganini, PA-C  metoprolol tartrate (LOPRESSOR) 25 MG tablet Take 1 tablet (25 mg total) by mouth 2 (two) times daily. Patient taking differently: Take 12.5 mg by mouth 2 (two) times daily.  07/09/17  Yes Angiulli, Lavon Paganini, PA-C  simvastatin (ZOCOR) 20 MG tablet Take 1 tablet (20 mg total) by mouth at bedtime. 07/09/17  Yes Angiulli, Lavon Paganini, PA-C   Allergies  Allergen Reactions   Prevnar [Pneumococcal  13-Val Conj Vacc] Anaphylaxis   Actos [Pioglitazone Hydrochloride] Other (See Comments)    Afib   Hydrochlorothiazide Other (See Comments)    Lower potassium to low   Penicillins Rash    Tolerated cefepime, ceftriaxone  Did it involve swelling of the face/tongue/throat, SOB, or low BP? No Did it involve sudden or severe rash/hives, skin peeling, or any reaction on the inside of your mouth or nose?Yes Did you need to seek medical attention at a hospital or doctor's office. Yes When did it last happen?78 years old If all above answers are NO, may proceed with cephalosporin use.   Review of Systems Back pain  Physical Exam Awake alert No distress Has chronic pain in his back Abdomen is not distended not tender No edema S1 S2 Clear lung sounds  Vital Signs: BP (!) 152/74 (BP Location: Left Arm)    Pulse 74    Temp 98.3 F (36.8 C) (Oral)    Resp 17    Ht 5\' 9"  (1.753 m)    Wt 77.2 kg    SpO2 97%    BMI 25.13 kg/m  Pain Scale: 0-10   Pain Score: 2    SpO2: SpO2: 97 % O2 Device:SpO2: 97 % O2 Flow Rate: .   IO: Intake/output summary:   Intake/Output Summary (Last 24 hours) at 03/22/2019 1352 Last data filed at 03/22/2019 0500 Gross per 24 hour  Intake 1700.6 ml  Output --  Net 1700.6 ml    LBM: Last BM Date: 03/19/19 Baseline Weight: Weight: 77.2 kg Most recent weight: Weight: 77.2 kg     Palliative Assessment/Data:    PPS 40%  Time In:  10 Time Out:  11 Time Total:  60 min.  Greater than 50%  of this time was spent counseling and coordinating care related to the above  assessment and plan.  Signed by: Loistine Chance, MD  [id number] Please contact Palliative Medicine Team phone at [phone] for questions and concerns.  For individual provider: See Shea Evans

## 2019-03-23 LAB — CBC WITH DIFFERENTIAL/PLATELET
Abs Immature Granulocytes: 0.07 10*3/uL (ref 0.00–0.07)
Basophils Absolute: 0.1 10*3/uL (ref 0.0–0.1)
Basophils Relative: 1 %
Eosinophils Absolute: 0.7 10*3/uL — ABNORMAL HIGH (ref 0.0–0.5)
Eosinophils Relative: 6 %
HCT: 35.2 % — ABNORMAL LOW (ref 39.0–52.0)
Hemoglobin: 11.3 g/dL — ABNORMAL LOW (ref 13.0–17.0)
Immature Granulocytes: 1 %
Lymphocytes Relative: 11 %
Lymphs Abs: 1.4 10*3/uL (ref 0.7–4.0)
MCH: 29.1 pg (ref 26.0–34.0)
MCHC: 32.1 g/dL (ref 30.0–36.0)
MCV: 90.7 fL (ref 80.0–100.0)
Monocytes Absolute: 1.4 10*3/uL — ABNORMAL HIGH (ref 0.1–1.0)
Monocytes Relative: 11 %
Neutro Abs: 9.3 10*3/uL — ABNORMAL HIGH (ref 1.7–7.7)
Neutrophils Relative %: 70 %
Platelets: 232 10*3/uL (ref 150–400)
RBC: 3.88 MIL/uL — ABNORMAL LOW (ref 4.22–5.81)
RDW: 13.2 % (ref 11.5–15.5)
WBC: 12.9 10*3/uL — ABNORMAL HIGH (ref 4.0–10.5)
nRBC: 0 % (ref 0.0–0.2)

## 2019-03-23 LAB — GLUCOSE, CAPILLARY
Glucose-Capillary: 128 mg/dL — ABNORMAL HIGH (ref 70–99)
Glucose-Capillary: 156 mg/dL — ABNORMAL HIGH (ref 70–99)
Glucose-Capillary: 124 mg/dL — ABNORMAL HIGH (ref 70–99)
Glucose-Capillary: 160 mg/dL — ABNORMAL HIGH (ref 70–99)

## 2019-03-23 LAB — BASIC METABOLIC PANEL
Anion gap: 7 (ref 5–15)
BUN: 15 mg/dL (ref 8–23)
CO2: 25 mmol/L (ref 22–32)
Calcium: 7.7 mg/dL — ABNORMAL LOW (ref 8.9–10.3)
Chloride: 102 mmol/L (ref 98–111)
Creatinine, Ser: 0.68 mg/dL (ref 0.61–1.24)
GFR calc Af Amer: >60 mL/min (ref >60)
GFR calc non Af Amer: >60 mL/min (ref >60)
Glucose, Bld: 239 mg/dL — ABNORMAL HIGH (ref 70–99)
Potassium: 3.7 mmol/L (ref 3.5–5.1)
Sodium: 134 mmol/L — ABNORMAL LOW (ref 135–145)

## 2019-03-23 LAB — URINE CULTURE: Culture: >=100000 — AB

## 2019-03-23 MED ORDER — SODIUM CHLORIDE 0.9 % IV SOLN
500.0000 mg | INTRAVENOUS | Status: DC
  Administered 2019-03-23 – 2019-03-24 (×2): 500 mg via INTRAVENOUS
  Filled 2019-03-23 (×2): qty 500

## 2019-03-23 MED ORDER — BISACODYL 5 MG PO TBEC
10.0000 mg | DELAYED_RELEASE_TABLET | Freq: Every day | ORAL | Status: DC | PRN
  Administered 2019-03-23: 10 mg via ORAL
  Filled 2019-03-23: qty 2

## 2019-03-23 MED ORDER — SODIUM CHLORIDE 0.9 % IV SOLN
2.0000 g | INTRAVENOUS | Status: DC
  Administered 2019-03-23 – 2019-03-24 (×2): 2 g via INTRAVENOUS
  Filled 2019-03-23: qty 20
  Filled 2019-03-23: qty 2

## 2019-03-23 NOTE — Progress Notes (Signed)
Upon rounding, pt is awake and verbalized that he is wet. RN use peri spray but pt c/o that it is cold and request for RN to use lukewarm water. RN then went to get basin and use warm water to clean patient. He did not c/o water being hot when I use it on the front of his private area but when I use it on the buttocks, he c/o it being hot and voices that he will report RN for throwing hot water on him.  I called my charge nurse, Brie, to check the water temperature and it is luke warm as he requested. He then said he feels better. No other concerns voice.  Ave Filter, RN

## 2019-03-23 NOTE — TOC Progression Note (Addendum)
Transition of Care [facility]) - Progression Note    Patient Details  Name: [name] MRN: [id number] Date of Birth: 03/04/1941  Transition of Care [facility]) CM/SW [place], [place] Phone Number: 03/23/2019, 11:29 AM  Clinical Narrative:    3:29pm- Auth received for pt to dc to [facility] when pt COVID has resulted; at this time in the day in order to ensure pharmacy can receive items pt likely dc tomorrow.  Josem Kaufmann #[id number].  11:29am- CSW met with pt and pt daughter at bedside. Reintroduced self, role, reason for visit. Provided them with CMS ratings packet. Pt daughter prefers [facility] first and [facility] second. CSW and pt had extended conversation about insurance coverage and the need for pt to purposefully work with therapies each and every time that they offer at [facility]. Pt in agreement; he wants to get stronger and gain more independence before returning home.   [facility] was contacted and will reach out to daughter. They will start insurance auth and requested a new COVID; this was relayed to MD and bedside RN who will coordinate for new swab.   Continue to follow.    Expected Discharge Plan: Skilled Nursing Facility Barriers to Discharge: Ship broker, Continued Medical Work up(COVID screen)  Expected Discharge Plan and Services Expected Discharge Plan: [place] In-house Referral: Clinical Social Work, Hospice / Palliative Care Discharge Planning Services: CM Consult Post Acute Care Choice: [place] Living arrangements for the past 2 months: Single Family Home   Social Determinants of Health (SDOH) Interventions    Readmission Risk Interventions No flowsheet data found.

## 2019-03-23 NOTE — Progress Notes (Signed)
Physical Therapy Treatment Patient Details Name: [name] MRN: [id number] DOB: [birth year] Today's Date: 03/23/2019    History of Present Illness Pt is a 78 y/o male admitted secondary to urinary pain and incontinence. Found to have SIRS secondary to UTI. Pt also with recent fall and L1 and L3 compression fx. PMH includes CVA, a fib, HTN, DM.     PT Comments    Patient had not received pain medication prior to arrival (as planned). RN brought medicine and pt willing to work on bed level exercises until his medicine began to work. He then was agreeable to work on standing tolerance and step towards [facility] prior to return to bed.     Follow Up Recommendations  SNF     Equipment Recommendations  None recommended by PT    Recommendations for Other Services OT consult     Precautions / Restrictions Precautions Precautions: Fall;Back Precaution Booklet Issued: No Precaution Comments: Verbally reviewed back precautions.  Required Braces or Orthoses: Spinal Brace Spinal Brace: Lumbar corset;Applied in sitting position Restrictions Weight Bearing Restrictions: No    Mobility  Bed Mobility Overal bed mobility: Needs Assistance Bed Mobility: Sidelying to Sit;Sit to Sidelying;Rolling Rolling: Supervision(with rails; each direction 3 times (changing linens; exercis) Sidelying to sit: Mod assist     Sit to sidelying: Mod assist General bed mobility comments: Mod A for trunk elevation and LE assist to sit at EOB. Required increased time secondary to pain. Mod A for LE assist for return to sidelying.   Transfers Overall transfer level: Needs assistance Equipment used: Rolling walker (2 wheeled) Transfers: Sit to/from Stand Sit to Stand: Min assist;From elevated surface(bed raised to facilitate transfer)         General transfer comment: Min A for lift assist and steadying to stand from elevated surface.   Ambulation/Gait Ambulation/Gait assistance: Min assist Gait  Distance (Feet): 2 Feet Assistive device: Rolling walker (2 wheeled) Gait Pattern/deviations: Decreased stride length;Trunk flexed;Step-to pattern Gait velocity: Decreased   General Gait Details: Only able to take side steps at EOB secondary to pain. Heavy reliance on UE and required assist to move RW.    Stairs             Wheelchair Mobility    Modified Rankin (Stroke Patients Only)       Balance Overall balance assessment: Needs assistance Sitting-balance support: Feet supported;Bilateral upper extremity supported Sitting balance-Leahy Scale: Poor Sitting balance - Comments: Reliant on BUE support to maintain sitting balance.    Standing balance support: Bilateral upper extremity supported;During functional activity Standing balance-Leahy Scale: Poor Standing balance comment: Heavy reliance on UE support to maintain balance.                             Cognition Arousal/Alertness: Awake/alert Behavior During Therapy: WFL for tasks assessed/performed Overall Cognitive Status: Within Functional Limits for tasks assessed                                        Exercises General Exercises - Lower Extremity Ankle Circles/Pumps: AROM;Both;10 reps Short Arc Quad: AROM;Both;10 reps Hip ABduction/ADduction: AROM;Both;Supine;Sidelying;20 reps(10 reps hooklying; 10 reps sidelying clam)    General Comments        Pertinent Vitals/Pain Pain Assessment: 0-10 Pain Score: 2  Pain Location: Back Pain Descriptors / Indicators: Grimacing;Guarding Pain Intervention(s): Limited activity within patient's  tolerance;Monitored during session;Repositioned;RN gave pain meds during session    Home Living                      Prior Function            PT Goals (current goals can now be found in the care plan section) Acute Rehab PT Goals Patient Stated Goal: to decrease pain PT Goal Formulation: With patient Time For Goal Achievement:  04/04/19 Potential to Achieve Goals: Fair Progress towards PT goals: Progressing toward goals    Frequency    Min 2X/week      PT Plan Current plan remains appropriate    Co-evaluation              AM-PAC PT "6 Clicks" Mobility   Outcome Measure  Help needed turning from your back to your side while in a flat bed without using bedrails?: None Help needed moving from lying on your back to sitting on the side of a flat bed without using bedrails?: A Lot Help needed moving to and from a bed to a chair (including a wheelchair)?: Total Help needed standing up from a chair using your arms (e.g., wheelchair or bedside chair)?: Total Help needed to walk in hospital room?: Total Help needed climbing 3-5 steps with a railing? : Total 6 Click Score: 10    End of Session Equipment Utilized During Treatment: Gait belt;Back brace Activity Tolerance: Patient limited by fatigue;Other (comment)(limited by fear of pain) Patient left: in bed;with call bell/phone within reach;with nursing/sitter in room Nurse Communication: Mobility status;Other (comment)(pt wet on arrival and didn't want to call RN "too busy alrea) PT Visit Diagnosis: Unsteadiness on feet (R26.81);History of falling (Z91.81);Difficulty in walking, not elsewhere classified (R26.2);Pain Pain - part of body: (back)     Time: 1451-1535 PT Time Calculation (min) (ACUTE ONLY): 44 min  Charges:  $Gait Training: 8-22 mins $Therapeutic Exercise: 23-37 mins                       Barry Brunner, PT       [name] 03/23/2019, 3:45 PM

## 2019-03-23 NOTE — Progress Notes (Signed)
PROGRESS NOTE    [name]  XNA:[id number] DOB: 05-28-1941 DOA: 03/20/2019 PCP: Alroy Dust, L.Marlou Sa, MD   Brief Narrative:  78 yo admitted with recurrent UTI, chest xray concerning for infiltrate and compression fracture L3 causing immobility due to pain and immobility leading to fast overall decline.    Assessment & Plan:   Principal Problem:   SIRS (systemic inflammatory response syndrome) (HCC) Active Problems:   B-cell lymphoma (HCC)   History of CVA (cerebrovascular accident)   PAF (paroxysmal atrial fibrillation) (HCC)   Type 2 diabetes mellitus with peripheral neuropathy (HCC)   Lumbar compression fracture (HCC)   UTI (urinary tract infection)   1. UTI has had recurrent UTIs. Change antibiotics to azithromycin and ceftriaxone for coverage of possible pneumonia as well as E. coli with broad sensitivity.  An extensive discussion with patient's daughter this morning with patient's been nonadherent to changing diaper at home filling his diaper with multiple bowel movements which is likely source of his infection. 2. Abnormal chest x-ray showing infiltrate.  We will continue antibiotics.  Patient appears to be close to baseline we will repeat chest x-ray tomorrow white count is decreased from 24-12.9 with negative blood cultures 3. Hypertension. Fair control.  Continue with home meds includeamlodipine beta-blockers lisinopril. 4. History of stroke on statins aspirin and Plavix.  We will continue without change 5. History of A. fib not on anticoagulation secondary to history of bleed per the chart. On aspirin Plavix.Rate controlled. 6. Diabetes mellitus type 2. Home meds includeTresiba. Will continue patient on sliding scale coverage. 7. History of lymphoma in remission. 8. Lumbar compression fracture. L1 and L3 from previous fall. Moving makes pain unbearable. snf recommended last admission and he refused. He has been living with his daughter refusing to move/get up for 3  weeks. He wears pull ups.  Daughter did note patient was able to get out of bed in the hospital but has been unwilling to do so at home, neurosurgery indicated he may benefit kyphoplasty if infection can be cleared up.   DVT prophylaxis: Heparin SQ  Code Status: full, met with palliative care    Code Status Orders  (From admission, onward)         Start     Ordered   03/21/19 0702  Full code  Continuous     03/21/19 0702        Code Status History    Date Active Date Inactive Code Status Order ID Comments User Context   02/23/2019 2253 03/03/2019 1744 Full Code [id number]  Elwyn Reach, MD Inpatient   02/09/2019 0022 02/10/2019 1816 Full Code [id number]  Rise Patience, MD Inpatient   06/28/2017 1703 07/09/2017 1441 Full Code [id number]  Cathlyn Parsons, PA-C Inpatient   06/28/2017 1703 06/28/2017 1703 Full Code [id number]  Cathlyn Parsons, PA-C Inpatient   06/23/2017 0026 06/28/2017 1641 Full Code [id number]  Norval Morton, MD ED   09/23/2015 1001 09/25/2015 1911 Full Code [id number]  Rise Patience, MD ED   08/15/2015 1941 08/19/2015 1621 Full Code [id number]  Elgergawy, Silver Huguenin, MD Inpatient   05/29/2014 2351 06/05/2014 1702 Full Code [id number]  Shanda Howells, MD ED   09/16/2013 1605 09/18/2013 1858 Full Code [id number]  Caren Griffins, MD ED   Advance Care Planning Activity    Advance Directive Documentation     Most Recent Value  Type of Advance Directive  Living will  Pre-existing out of facility DNR order (yellow form or pink MOST form)  --  "  MOST" Form in Place?  --     Family Communication: Daughter at bedside Disposition Plan:   Patient remained the hospital with no safe discharge plan, awaiting further evaluation and placement skilled nursing facility for intensive posthospitalization physical therapy for gait mobility balance transfer and strength. Consults called: None Admission status: Inpatient   Consultants:   None  Procedures:  Mr Lumbar Spine W Wo  Contrast  Result Date: 02/23/2019 CLINICAL DATA:  Back pain.  Recent fall. EXAM: MRI LUMBAR SPINE WITHOUT AND WITH CONTRAST TECHNIQUE: Multiplanar and multiecho pulse sequences of the lumbar spine were obtained without and with intravenous contrast. CONTRAST:  7.5 mL Gadavist intravenous contrast. COMPARISON:  CT lumbar spine from same day. FINDINGS: Segmentation: Transitional lumbosacral anatomy again identified with fully lumbarized S1 segment, in keeping with numbering system used on prior studies. Alignment:  Physiologic. Vertebrae: Acute minimally depressed compression fractures of the L1 superior endplate and L3 inferior endplate with associated marrow edema and enhancement. Additional acute nondisplaced fracture of the lower sacrum at the S4-S5 level. No evidence of discitis. No focal bone lesion. Conus medullaris and cauda equina: Conus extends to the L1 level. Conus and cauda equina appear normal. No intradural enhancement. Paraspinal and other soft tissues: Negative. Disc levels: T12-L1:  Negative. L1-L2:  Negative. L2-L3:  Negative. L3-L4:  Mild disc bulging.  No stenosis. L4-L5: Mild disc bulging with superimposed broad-based left foraminal disc protrusion encroaching on the exiting left L4 nerve root. Mild bilateral facet arthropathy. Mild left neuroforaminal stenosis. No spinal canal or right neuroforaminal stenosis. L5-S1: Mild disc bulging, endplate spurring, and bilateral facet arthropathy. Mild to moderate right lateral recess stenosis. Mild left greater than right neuroforaminal stenosis. No spinal canal stenosis. S1-S2: Negative. IMPRESSION: 1. Acute minimally depressed compression fractures of the L1 superior endplate and L3 inferior endplate. No retropulsion. 2. Additional acute nondisplaced fracture of the lower sacrum. 3. Mild spondylosis at L4-L5 and L5-S1 as described above. Electronically Signed   By: Titus Dubin M.D.   On: 02/23/2019 15:11   Ct Abdomen Pelvis W Contrast  Result  Date: 02/23/2019 CLINICAL DATA:  78 year old male with abdominal pain. EXAM: CT ABDOMEN AND PELVIS WITH CONTRAST TECHNIQUE: Multidetector CT imaging of the abdomen and pelvis was performed using the standard protocol following bolus administration of intravenous contrast. CONTRAST:  16m OMNIPAQUE IOHEXOL 300 MG/ML  SOLN COMPARISON:  Lumbar spine CT dated 02/23/2019 and CT of the abdomen pelvis dated 10/07/2012. FINDINGS: Lower chest: Minimal bibasilar linear atelectasis/scarring. The visualized lung bases are clear. Advanced multi vessel coronary vascular calcification noted. No intra-abdominal free air or free fluid. Hepatobiliary: The liver is unremarkable. No intrahepatic biliary ductal dilatation. Multiple small stones noted within the gallbladder. No pericholecystic fluid or evidence of acute cholecystitis by CT. Ultrasound may provide better evaluation if there is clinical concern for acute cholecystitis. Pancreas: Unremarkable. No pancreatic ductal dilatation or surrounding inflammatory changes. Spleen: There is a 1 cm calcified splenic granuloma. Several additional splenic hypodense lesions are not characterized but may represent cysts or hemangioma. Adrenals/Urinary Tract: The adrenal glands are unremarkable. There is no hydronephrosis on either side. There is symmetric enhancement and excretion of contrast by both kidneys. Bilateral renal cysts and smaller hypodense lesions which are too small to characterize. The largest cyst measures approximately 15 mm in the upper pole the right kidney. The visualized ureters appear unremarkable. There is trabeculated appearance of the bladder wall likely related to chronic bladder outlet obstruction. Stomach/Bowel: There is no bowel obstruction or active inflammation.  The appendix is normal. Vascular/Lymphatic: Advanced aortoiliac atherosclerotic disease. The IVC is unremarkable. No portal venous gas. There is no adenopathy. Reproductive: Enlarged prostate gland  measuring 5 cm in transverse axial diameter. Other: None Musculoskeletal: Osteopenia with degenerative changes of the spine. Fracture of the inferior endplate of the L3 extending to the anterior cortex as seen on the lumbar spine CT of table 02/08/2019. No new fracture. IMPRESSION: 1. No acute intra-abdominal or pelvic pathology. No bowel obstruction or active inflammation. Normal appendix. 2. Cholelithiasis. 3. L3 inferior endplate compression fracture. 4. Advanced aortic Atherosclerosis (ICD10-I70.0). Electronically Signed   By: Anner Crete M.D.   On: 02/23/2019 11:01   Ct L-spine No Charge  Result Date: 02/23/2019 CLINICAL DATA:  78 year old male with back pain. EXAM: CT LUMBAR SPINE WITHOUT CONTRAST TECHNIQUE: Multidetector CT imaging of the lumbar spine was performed without intravenous contrast administration. Multiplanar CT image reconstructions were also generated. COMPARISON:  Lumbar spine CT dated 02/08/2019 and CT of the abdomen pelvis dated 02/23/2019 FINDINGS: Segmentation: 6 lumbar type vertebra. The lowermost vertebra is designated as a lumbarized S1. Alignment: No acute subluxation. Vertebrae: Fracture inferior endplate of the L3 extending into the anterior vertebral body cortex. This has not significantly changed since the prior CT. No retropulsion. No other acute fracture. The bones are osteopenic. Paraspinal and other soft tissues: Negative. Disc levels: Multilevel degenerative disc disease. There is disc desiccation and vacuum phenomena at L4-L5 and L5-S1. The neural foramina remain patent. Mild diffuse disc bulge at L3-L4, L4-5, and L5-S1. IMPRESSION: 1. No new fracture. Fracture of the inferior endplate of L3 extending into the anterior vertebral body cortex, unchanged compared to the prior CT. No retropulsion. 2. Osteopenia with multilevel degenerative disc disease and mild diffuse disc bulge at L3-L4, L4-5, and L5-S1. Electronically Signed   By: Anner Crete M.D.   On: 02/23/2019  10:54   Dg Chest Port 1 View  Result Date: 03/21/2019 CLINICAL DATA:  Sepsis EXAM: PORTABLE CHEST 1 VIEW COMPARISON:  Radiograph 02/23/2019 FINDINGS: Diffuse airways thickening with more patchy retrocardiac opacity. No radiographic features of edema. No pneumothorax or effusion. Cardiomediastinal contours including a calcified tortuous aorta are similar to prior exams. Degenerative changes are present in the imaged spine and shoulders. No acute osseous or soft tissue abnormality. IMPRESSION: 1. Diffuse airways thickening with more patchy retrocardiac opacity, which could reflect atelectasis or infiltrate. 2.  Aortic Atherosclerosis (ICD10-I70.0). Electronically Signed   By: Lovena Le M.D.   On: 03/21/2019 01:58   Dg Chest Port 1 View  Result Date: 02/23/2019 CLINICAL DATA:  Weakness. EXAM: PORTABLE CHEST 1 VIEW COMPARISON:  Radiograph of July 05, 2017. FINDINGS: The heart size and mediastinal contours are within normal limits. Atherosclerosis of thoracic aorta is noted. Hypoinflation of the lungs is noted with minimal bibasilar subsegmental atelectasis. No pneumothorax or pleural effusion is noted. The visualized skeletal structures are unremarkable. IMPRESSION: Hypoinflation of the lungs with minimal bibasilar subsegmental atelectasis. Aortic Atherosclerosis (ICD10-I70.0). Electronically Signed   By: Marijo Conception M.D.   On: 02/23/2019 09:50   US Abdomen Limited Ruq  Result Date: 02/23/2019 CLINICAL DATA:  Abdominal pain.  Gallstones on CT earlier this day. EXAM: ULTRASOUND ABDOMEN LIMITED RIGHT UPPER QUADRANT COMPARISON:  Abdominal CT earlier this day. FINDINGS: Gallbladder: Physiologically distended with intraluminal gallstone. Normal wall thickness of 2 mm. No pericholecystic fluid. No sonographic Murphy sign noted by sonographer. Common bile duct: Diameter: 6 mm, normal. Liver: No focal lesion identified. Within normal limits in parenchymal echogenicity. Portal  vein is patent on color Doppler  imaging with normal direction of blood flow towards the liver. Other: None. IMPRESSION: Gallstones without sonographic findings of acute cholecystitis. No biliary dilatation. Electronically Signed   By: Keith Rake M.D.   On: 02/23/2019 19:27     Antimicrobials:   Discontinue cefepime and vancomycin October 1  Started azithromycin and ceftriaxone based on culture data and possible underlying pneumonia   Subjective: Patient oppositional with staff last night Per daughter has a long history of being oppositional nonadherent at home There were no acute problems.  Objective: Vitals:   03/22/19 1807 03/22/19 1910 03/22/19 2344 03/23/19 0407  BP: (!) 117/47 (!) 130/58 (!) 135/59 (!) 152/80  Pulse: 78 70 64 75  Resp: '18 16 18 18  '$ Temp: 98.6 F (37 C) 98.1 F (36.7 C) (!) 97.5 F (36.4 C) 98.4 F (36.9 C)  TempSrc: Oral Oral Oral Oral  SpO2: 95% 96% 96% 98%  Weight:      Height:        Intake/Output Summary (Last 24 hours) at 03/23/2019 1121 Last data filed at 03/23/2019 0415 Gross per 24 hour  Intake 560 ml  Output --  Net 560 ml   Filed Weights   03/21/19 1222  Weight: 77.2 kg    Examination:  General exam: Appears calm and comfortable  Respiratory system: Clear to auscultation. Respiratory effort normal. Cardiovascular system: S1 & S2 heard, RRR. No JVD, murmurs, rubs, gallops or clicks. No pedal edema. Gastrointestinal system: Abdomen is nondistended, soft and nontender. No organomegaly or masses felt. Normal bowel sounds heard. Central nervous system: Alert and oriented. No focal neurological deficits. Extremities: warm well perfused, limited strength secondary to pain per patient no contractures Skin: No rashes, lesions or ulcers Psychiatry: No evidence of acute decompensation mood & affect appropriate.     Data Reviewed: I have personally reviewed following labs and imaging studies  CBC: Recent Labs  Lab 03/20/19 2304 03/21/19 0750 03/22/19 0235  03/23/19 0831  WBC 23.5* 21.0* 15.0* 12.9*  NEUTROABS 20.2* 16.8*  --  9.3*  HGB 14.7 13.1 11.7* 11.3*  HCT 43.2 40.8 36.4* 35.2*  MCV 90.0 92.1 91.7 90.7  PLT 286 272 226 841   Basic Metabolic Panel: Recent Labs  Lab 03/20/19 2304 03/21/19 0750 03/22/19 0235 03/23/19 0831  NA 138 141 136 134*  K 4.1 3.9 4.2 3.7  CL 103 108 107 102  CO2 '23 25 23 25  '$ GLUCOSE 191* 101* 199* 239*  BUN '20 22 22 15  '$ CREATININE 0.84 0.90 0.82 0.68  CALCIUM 8.3* 8.0* 7.6* 7.7*   GFR: Estimated Creatinine Clearance: 77.3 mL/min (by C-G formula based on SCr of 0.68 mg/dL). Liver Function Tests: Recent Labs  Lab 03/20/19 2304 03/21/19 0750  AST 22 17  ALT 20 17  ALKPHOS 89 77  BILITOT 0.5 0.8  PROT 6.4* 5.6*  ALBUMIN 2.5* 2.2*   No results for input(s): LIPASE, AMYLASE in the last 168 hours. No results for input(s): AMMONIA in the last 168 hours. Coagulation Profile: Recent Labs  Lab 03/20/19 2304  INR 1.2   Cardiac Enzymes: No results for input(s): CKTOTAL, CKMB, CKMBINDEX, TROPONINI in the last 168 hours. BNP (last 3 results) No results for input(s): PROBNP in the last 8760 hours. HbA1C: No results for input(s): HGBA1C in the last 72 hours. CBG: Recent Labs  Lab 03/22/19 1105 03/22/19 1614 03/22/19 2024 03/23/19 0613 03/23/19 1116  GLUCAP 140* 150* 281* 128* 156*   Lipid Profile: No results  for input(s): CHOL, HDL, LDLCALC, TRIG, CHOLHDL, LDLDIRECT in the last 72 hours. Thyroid Function Tests: No results for input(s): TSH, T4TOTAL, FREET4, T3FREE, THYROIDAB in the last 72 hours. Anemia Panel: No results for input(s): VITAMINB12, FOLATE, FERRITIN, TIBC, IRON, RETICCTPCT in the last 72 hours. Sepsis Labs: Recent Labs  Lab 03/20/19 2304 03/21/19 1130  LATICACIDVEN 1.7 1.4    Recent Results (from the past 240 hour(s))  Blood Culture (routine x 2)     Status: None (Preliminary result)   Collection Time: 03/20/19 10:18 PM   Specimen: BLOOD RIGHT FOREARM  Result Value  Ref Range Status   Specimen Description BLOOD RIGHT FOREARM  Final   Special Requests   Final    BOTTLES DRAWN AEROBIC AND ANAEROBIC Blood Culture adequate volume   Culture   Final    NO GROWTH 3 DAYS Performed at [facility], [place] [address], [place], [place] [address]    Report Status PENDING  Incomplete  Blood Culture (routine x 2)     Status: None (Preliminary result)   Collection Time: 03/20/19 10:30 PM   Specimen: BLOOD LEFT FOREARM  Result Value Ref Range Status   Specimen Description BLOOD LEFT FOREARM  Final   Special Requests   Final    BOTTLES DRAWN AEROBIC AND ANAEROBIC Blood Culture adequate volume   Culture   Final    NO GROWTH 3 DAYS Performed at [facility], [place] [address], [place], [place] [address]    Report Status PENDING  Incomplete  SARS CORONAVIRUS 2 (TAT 6-24 HRS) Nasopharyngeal Nasopharyngeal Swab     Status: None   Collection Time: 03/21/19  1:16 AM   Specimen: Nasopharyngeal Swab  Result Value Ref Range Status   SARS Coronavirus 2 NEGATIVE NEGATIVE Final    Comment: (NOTE) SARS-CoV-2 target nucleic acids are NOT DETECTED. The SARS-CoV-2 RNA is generally detectable in upper and lower respiratory specimens during the acute phase of infection. Negative results do not preclude SARS-CoV-2 infection, do not rule out co-infections with other pathogens, and should not be used as the sole basis for treatment or other patient management decisions. Negative results must be combined with clinical observations, patient history, and epidemiological information. The expected result is Negative. Fact Sheet for Patients: SugarRoll.be Fact Sheet for Healthcare Providers: https://www.woods-mathews.com/ This test is not yet approved or cleared by the [place] FDA and  has been authorized for detection and/or diagnosis of SARS-CoV-2 by FDA under an Emergency Use Authorization (EUA). This EUA will remain  in  effect (meaning this test can be used) for the duration of the COVID-19 declaration under Section 56 4(b)(1) of the Act, 21 U.S.C. section 360bbb-3(b)(1), unless the authorization is terminated or revoked sooner. Performed at [facility], [place] [address], [place], [place] [address]   Urine culture     Status: Abnormal   Collection Time: 03/21/19  1:45 AM   Specimen: In/Out Cath Urine  Result Value Ref Range Status   Specimen Description IN/OUT CATH URINE  Final   Special Requests   Final    NONE Performed at [facility], [place] [address] [place], [place] [address]    Culture >=100,000 COLONIES/mL ESCHERICHIA COLI (A)  Final   Report Status 03/23/2019 FINAL  Final   Organism ID, Bacteria ESCHERICHIA COLI (A)  Final      Susceptibility   Escherichia coli - MIC*    AMPICILLIN >=32 RESISTANT Resistant     CEFAZOLIN 16 SENSITIVE Sensitive  CEFTRIAXONE <=1 SENSITIVE Sensitive     CIPROFLOXACIN <=0.25 SENSITIVE Sensitive     GENTAMICIN <=1 SENSITIVE Sensitive     IMIPENEM <=0.25 SENSITIVE Sensitive     NITROFURANTOIN <=16 SENSITIVE Sensitive     TRIMETH/SULFA <=20 SENSITIVE Sensitive     AMPICILLIN/SULBACTAM 16 INTERMEDIATE Intermediate     PIP/TAZO 8 SENSITIVE Sensitive     Extended ESBL NEGATIVE Sensitive     * >=100,000 COLONIES/mL ESCHERICHIA COLI         Radiology Studies: No results found.      Scheduled Meds:  amLODipine  5 mg Oral BID   aspirin EC  81 mg Oral QPM   clopidogrel  75 mg Oral Daily   heparin  5,000 Units Subcutaneous Q8H   insulin aspart  0-9 Units Subcutaneous TID WC   insulin glargine  12 Units Subcutaneous Daily   lidocaine  1 patch Transdermal Q24H   lisinopril  2.5 mg Oral Daily   metoprolol tartrate  12.5 mg Oral BID   simvastatin  20 mg Oral QHS   Continuous Infusions:  ceFEPime (MAXIPIME) IV 2 g (03/23/19 0409)     LOS: 2 days    Time spent: 35 min    Nicolette Bang, MD [facility]  If 7PM-7AM, please contact night-coverage  03/23/2019, 11:21 AM

## 2019-03-24 LAB — GLUCOSE, CAPILLARY
Glucose-Capillary: 72 mg/dL (ref 70–99)
Glucose-Capillary: 110 mg/dL — ABNORMAL HIGH (ref 70–99)

## 2019-03-24 LAB — NOVEL CORONAVIRUS, NAA (HOSP ORDER, SEND-OUT TO REF LAB; TAT 18-24 HRS): SARS-CoV-2, NAA: NOT DETECTED

## 2019-03-24 MED ORDER — BISACODYL 5 MG PO TBEC: 10.0000 mg | DELAYED_RELEASE_TABLET | Freq: Every day | ORAL | 0 refills | Status: AC | PRN

## 2019-03-24 MED ORDER — LISINOPRIL 2.5 MG PO TABS: 2.5000 mg | ORAL_TABLET | Freq: Every day | ORAL | 0 refills | Status: DC

## 2019-03-24 MED ORDER — HYDROCODONE-ACETAMINOPHEN 5-325 MG PO TABS: 1.0000 | ORAL_TABLET | ORAL | 0 refills | Status: DC | PRN

## 2019-03-24 MED ORDER — INSULIN GLARGINE 100 UNIT/ML ~~LOC~~ SOLN: 12.0000 [IU] | Freq: Every day | SUBCUTANEOUS | 11 refills | Status: DC

## 2019-03-24 MED ORDER — AMLODIPINE BESYLATE 5 MG PO TABS: 5.0000 mg | ORAL_TABLET | Freq: Two times a day (BID) | ORAL | 0 refills | Status: DC

## 2019-03-24 MED ORDER — METOPROLOL TARTRATE 25 MG PO TABS: 12.5000 mg | ORAL_TABLET | Freq: Two times a day (BID) | ORAL | 0 refills | Status: AC

## 2019-03-24 MED ORDER — LEVOFLOXACIN 500 MG PO TABS: 500.0000 mg | ORAL_TABLET | Freq: Every day | ORAL | 0 refills | Status: AC

## 2019-03-24 NOTE — Progress Notes (Addendum)
[place], report given to [employer]  Informed pt and pt daughter  All belongings packed including back brace Pt IV removed, catheter intact and telemetry removed  Awaiting PTAR

## 2019-03-24 NOTE — Progress Notes (Signed)
Spoke to MD via secure chat  MD aware morning CBG was 72  MD stated to give 6 units of lantus  Pt aware  Will continue to monitor

## 2019-03-24 NOTE — Progress Notes (Signed)
Informed social work of pt discharge orders [place] aware [phone] Pt COVID resulted as not detected  Informed pt of plan of care

## 2019-03-24 NOTE — TOC Transition Note (Signed)
Transition of Care [facility]) - CM/SW Discharge Note   Patient Details  Name: [name] MRN: [id number] Date of Birth: August 16, 1940  Transition of Care [facility]) CM/SW Contact:  Gelene Mink, [place] Phone Number: 03/24/2019, 1:49 PM   Clinical Narrative:     Patient will DC to: [facility] [place]  Anticipated DC date: 03/24/2019 Family notified: Yes Transport by: Corey Harold   Per MD patient ready for DC to . RN, patient, patient's family, and facility notified of DC. Discharge Summary and FL2 sent to facility. RN to call report prior to discharge [phone]). The patient will go to room 405B.  DC packet on chart. Ambulance transport requested for patient.   CSW will sign off for now as social work intervention is no longer needed. Please consult [place] again if new needs arise.  Zyrell Carmean, LCSW-A /Clinical Social Work Department Cell: [phone]     Final next level of care: [place] Barriers to Discharge: No Barriers Identified   Patient Goals and CMS Choice Patient states their goals for this hospitalization and ongoing recovery are:: Pt will go to [facility] for discharge CMS Medicare.gov Compare Post Acute Care list provided to:: Patient Represenative (must comment) Choice offered to / list presented to : Adult Children  Discharge Placement   Existing PASRR number confirmed : 03/21/19          Patient chooses bed at: [facility] Patient to be transferred to facility by: [place] Name of family member notified: [name] Patient and family notified of of transfer: 03/24/19  Discharge Plan and Services In-house Referral: Clinical Social Work, Hospice / Palliative Care Discharge Planning Services: CM Consult Post Acute Care Choice: [place]          DME Arranged: N/A DME Agency: NA       HH Arranged: NA HH Agency: [facility] [place] ([facility])  Interventions     Readmission Risk Interventions Readmission Risk Prevention Plan 03/23/2019  Transportation Screening Complete  PCP or Specialist Appt within 3-5 Days Not Complete  Not Complete comments SNF placement planned  Some recent data might be hidden

## 2019-03-24 NOTE — Progress Notes (Signed)
PTAR at bedside  Report given to transport  PTAR has all paperwork including printed prescriptions and pt belongings  Pt discharged via stretcher

## 2019-03-24 NOTE — Care Management Important Message (Signed)
Important Message  Patient Details  Name: [name] MRN: [id number] Date of Birth: August 28, 1940   Medicare Important Message Given:  Yes     [name] 03/24/2019, 3:13 PM

## 2019-03-24 NOTE — Discharge Summary (Signed)
Physician Discharge Summary  [name] [id number] DOB: [birth year] DOA: 03/20/2019  PCP: Alroy Dust, L.Marlou Sa, MD  Admit date: 03/20/2019 Discharge date: 03/24/2019  Admitted From: Inpatient Disposition: SNF  Recommendations for Outpatient Follow-up:  1. Per receiving facility  Home Health:No Equipment/Devices: Per physical therapy Discharge Condition:Stable CODE STATUS:Full code Diet recommendation: Regular healthy diet  Brief/Interim Summary: 78 yo admitted with recurrent UTI, chest xray concerning for infiltrate and compression fracture L3 causing immobility due to pain and immobility leading to fast overall decline.  Hospital course 1. UTI: Pt has had recurrent UTIs. Initially changed antibiotics to azithromycin and ceftriaxone for coverage of possible pneumonia as well as E. coli with broad sensitivity. Pt will be discharged to complete a course of levaquin at SNF. An extensive discussion with patient's daughter noted patient's been nonadherent to changing diaper at home filling his diaper with multiple bowel movements which is likely source of his infection. 2. Abnormal chest x-ray showing infiltrate.  I continued antibiotics as above.   white count is decreased from 24-12.9 with negative blood cultures.  He has remained afebrile and hemodynamically stable 3. Hypertension. Fair control.  Continue with home meds includeamlodipine beta-blockers lisinopril with adjustments as noted 4. History of stroke on statins aspirin and Plavix.  We will continue without change 5. History of A. fib not on anticoagulation secondary to history of bleed per the chart. On aspirin Plavix.Rate controlled.No changes on d/c 6. Diabetes mellitus type 2. Pt will continue on Lantus with ssi while inpatient, receiving facility toa djust regimen based on CBG's 7. History of lymphoma in remission. 8. Lumbar compression fracture. L1 and L3 from previous fall. Moving makes pain unbearable. snf  recommended last admission and he refused. PTA he had been living with his daughter refusing to move/get up for 3 weeks. He wears pull ups.  Daughter did note patient was able to get out of bed in the hospital but has been unwilling to do so at home, neurosurgery indicated he may benefit kyphoplasty once infection can be cleared up.  DISPO: Patient accepted to skilled nursing facility for intensive posthospitalization physical therapy for gait mobility balance transfer and strength training.  Transfer pending insurance authorization and coronavirus testing results   Discharge Diagnoses:  Principal Problem:   SIRS (systemic inflammatory response syndrome) (HCC) Active Problems:   B-cell lymphoma (HCC)   History of CVA (cerebrovascular accident)   PAF (paroxysmal atrial fibrillation) (HCC)   Type 2 diabetes mellitus with peripheral neuropathy ([place])   Lumbar compression fracture (HCC)   UTI (urinary tract infection)    Discharge Instructions  Discharge Instructions    Call MD for:   Complete by: As directed    Per receiving facility   Diet - low sodium heart healthy   Complete by: As directed    Increase activity slowly   Complete by: As directed      Allergies as of 03/24/2019      Reactions   Prevnar [pneumococcal 13-val Conj Vacc] Anaphylaxis   Actos [pioglitazone Hydrochloride] Other (See Comments)   Afib   Hydrochlorothiazide Other (See Comments)   Lower potassium to low   Penicillins Rash   Tolerated cefepime, ceftriaxone Did it involve swelling of the face/tongue/throat, SOB, or low BP? No Did it involve sudden or severe rash/hives, skin peeling, or any reaction on the inside of your mouth or nose?Yes Did you need to seek medical attention at a hospital or doctor's office. Yes When did it last happen?78 years old If all above  answers are "NO", may proceed with cephalosporin use.      Medication List    STOP taking these medications   Tresiba 100 UNIT/ML  Soln Generic drug: Insulin Degludec Replaced by: insulin glargine 100 UNIT/ML injection     TAKE these medications   acetaminophen 500 MG tablet Commonly known as: TYLENOL Take 500 mg by mouth every 6 (six) hours as needed for mild pain.   amLODipine 5 MG tablet Commonly known as: NORVASC Take 1 tablet (5 mg total) by mouth 2 (two) times daily. What changed:   medication strength  how much to take  when to take this   aspirin 81 MG EC tablet Take 1 tablet (81 mg total) by mouth daily. What changed: when to take this   bisacodyl 5 MG EC tablet Commonly known as: DULCOLAX Take 2 tablets (10 mg total) by mouth daily as needed for moderate constipation.   clopidogrel 75 MG tablet Commonly known as: PLAVIX Take 75 mg by mouth daily.   HYDROcodone-acetaminophen 5-325 MG tablet Commonly known as: NORCO/VICODIN Take 1 tablet by mouth every 4 (four) hours as needed for severe pain. What changed:   when to take this  reasons to take this   insulin glargine 100 UNIT/ML injection Commonly known as: LANTUS Inject 0.12 mLs (12 Units total) into the skin daily. Start taking on: March 25, 2019 Replaces: Tyler Aas 100 UNIT/ML Soln   levofloxacin 500 MG tablet Commonly known as: LEVAQUIN Take 1 tablet (500 mg total) by mouth daily for 7 days.   lisinopril 2.5 MG tablet Commonly known as: ZESTRIL Take 1 tablet (2.5 mg total) by mouth daily. Start taking on: March 25, 2019   metoprolol tartrate 25 MG tablet Commonly known as: LOPRESSOR Take 0.5 tablets (12.5 mg total) by mouth 2 (two) times daily.   simvastatin 20 MG tablet Commonly known as: ZOCOR Take 1 tablet (20 mg total) by mouth at bedtime.      Contact information for after-discharge care    Destination    [facility] Preferred SNF .   Service: Skilled Nursing Contact information: [place] [phone]             Allergies  Allergen  Reactions  . Prevnar [Pneumococcal 13-Val Conj Vacc] Anaphylaxis  . Actos [Pioglitazone Hydrochloride] Other (See Comments)    Afib  . Hydrochlorothiazide Other (See Comments)    Lower potassium to low  . Penicillins Rash    Tolerated cefepime, ceftriaxone  Did it involve swelling of the face/tongue/throat, SOB, or low BP? No Did it involve sudden or severe rash/hives, skin peeling, or any reaction on the inside of your mouth or nose?Yes Did you need to seek medical attention at a hospital or doctor's office. Yes When did it last happen?78 years old If all above answers are "NO", may proceed with cephalosporin use.    Consultations:  Palliative   Procedures/Studies: Mr Lumbar Spine W Wo Contrast  Result Date: 02/23/2019 CLINICAL DATA:  Back pain.  Recent fall. EXAM: MRI LUMBAR SPINE WITHOUT AND WITH CONTRAST TECHNIQUE: Multiplanar and multiecho pulse sequences of the lumbar spine were obtained without and with intravenous contrast. CONTRAST:  7.5 mL Gadavist intravenous contrast. COMPARISON:  CT lumbar spine from same day. FINDINGS: Segmentation: Transitional lumbosacral anatomy again identified with fully lumbarized S1 segment, in keeping with numbering system used on prior studies. Alignment:  Physiologic. Vertebrae: Acute minimally depressed compression fractures of the L1 superior endplate and L3 inferior  endplate with associated marrow edema and enhancement. Additional acute nondisplaced fracture of the lower sacrum at the S4-S5 level. No evidence of discitis. No focal bone lesion. Conus medullaris and cauda equina: Conus extends to the L1 level. Conus and cauda equina appear normal. No intradural enhancement. Paraspinal and other soft tissues: Negative. Disc levels: T12-L1:  Negative. L1-L2:  Negative. L2-L3:  Negative. L3-L4:  Mild disc bulging.  No stenosis. L4-L5: Mild disc bulging with superimposed broad-based left foraminal disc protrusion encroaching on the exiting left L4  nerve root. Mild bilateral facet arthropathy. Mild left neuroforaminal stenosis. No spinal canal or right neuroforaminal stenosis. L5-S1: Mild disc bulging, endplate spurring, and bilateral facet arthropathy. Mild to moderate right lateral recess stenosis. Mild left greater than right neuroforaminal stenosis. No spinal canal stenosis. S1-S2: Negative. IMPRESSION: 1. Acute minimally depressed compression fractures of the L1 superior endplate and L3 inferior endplate. No retropulsion. 2. Additional acute nondisplaced fracture of the lower sacrum. 3. Mild spondylosis at L4-L5 and L5-S1 as described above. Electronically Signed   By: Titus Dubin M.D.   On: 02/23/2019 15:11   Ct Abdomen Pelvis W Contrast  Result Date: 02/23/2019 CLINICAL DATA:  79 year old male with abdominal pain. EXAM: CT ABDOMEN AND PELVIS WITH CONTRAST TECHNIQUE: Multidetector CT imaging of the abdomen and pelvis was performed using the standard protocol following bolus administration of intravenous contrast. CONTRAST:  135mL OMNIPAQUE IOHEXOL 300 MG/ML  SOLN COMPARISON:  Lumbar spine CT dated 02/23/2019 and CT of the abdomen pelvis dated 10/07/2012. FINDINGS: Lower chest: Minimal bibasilar linear atelectasis/scarring. The visualized lung bases are clear. Advanced multi vessel coronary vascular calcification noted. No intra-abdominal free air or free fluid. Hepatobiliary: The liver is unremarkable. No intrahepatic biliary ductal dilatation. Multiple small stones noted within the gallbladder. No pericholecystic fluid or evidence of acute cholecystitis by CT. Ultrasound may provide better evaluation if there is clinical concern for acute cholecystitis. Pancreas: Unremarkable. No pancreatic ductal dilatation or surrounding inflammatory changes. Spleen: There is a 1 cm calcified splenic granuloma. Several additional splenic hypodense lesions are not characterized but may represent cysts or hemangioma. Adrenals/Urinary Tract: The adrenal glands are  unremarkable. There is no hydronephrosis on either side. There is symmetric enhancement and excretion of contrast by both kidneys. Bilateral renal cysts and smaller hypodense lesions which are too small to characterize. The largest cyst measures approximately 15 mm in the upper pole the right kidney. The visualized ureters appear unremarkable. There is trabeculated appearance of the bladder wall likely related to chronic bladder outlet obstruction. Stomach/Bowel: There is no bowel obstruction or active inflammation. The appendix is normal. Vascular/Lymphatic: Advanced aortoiliac atherosclerotic disease. The IVC is unremarkable. No portal venous gas. There is no adenopathy. Reproductive: Enlarged prostate gland measuring 5 cm in transverse axial diameter. Other: None Musculoskeletal: Osteopenia with degenerative changes of the spine. Fracture of the inferior endplate of the L3 extending to the anterior cortex as seen on the lumbar spine CT of table 02/08/2019. No new fracture. IMPRESSION: 1. No acute intra-abdominal or pelvic pathology. No bowel obstruction or active inflammation. Normal appendix. 2. Cholelithiasis. 3. L3 inferior endplate compression fracture. 4. Advanced aortic Atherosclerosis (ICD10-I70.0). Electronically Signed   By: Anner Crete M.D.   On: 02/23/2019 11:01   Ct L-spine No Charge  Result Date: 02/23/2019 CLINICAL DATA:  78 year old male with back pain. EXAM: CT LUMBAR SPINE WITHOUT CONTRAST TECHNIQUE: Multidetector CT imaging of the lumbar spine was performed without intravenous contrast administration. Multiplanar CT image reconstructions were also generated. COMPARISON:  Lumbar  spine CT dated 02/08/2019 and CT of the abdomen pelvis dated 02/23/2019 FINDINGS: Segmentation: 6 lumbar type vertebra. The lowermost vertebra is designated as a lumbarized S1. Alignment: No acute subluxation. Vertebrae: Fracture inferior endplate of the L3 extending into the anterior vertebral body cortex. This  has not significantly changed since the prior CT. No retropulsion. No other acute fracture. The bones are osteopenic. Paraspinal and other soft tissues: Negative. Disc levels: Multilevel degenerative disc disease. There is disc desiccation and vacuum phenomena at L4-L5 and L5-S1. The neural foramina remain patent. Mild diffuse disc bulge at L3-L4, L4-5, and L5-S1. IMPRESSION: 1. No new fracture. Fracture of the inferior endplate of L3 extending into the anterior vertebral body cortex, unchanged compared to the prior CT. No retropulsion. 2. Osteopenia with multilevel degenerative disc disease and mild diffuse disc bulge at L3-L4, L4-5, and L5-S1. Electronically Signed   By: Anner Crete M.D.   On: 02/23/2019 10:54   Dg Chest Port 1 View  Result Date: 03/21/2019 CLINICAL DATA:  Sepsis EXAM: PORTABLE CHEST 1 VIEW COMPARISON:  Radiograph 02/23/2019 FINDINGS: Diffuse airways thickening with more patchy retrocardiac opacity. No radiographic features of edema. No pneumothorax or effusion. Cardiomediastinal contours including a calcified tortuous aorta are similar to prior exams. Degenerative changes are present in the imaged spine and shoulders. No acute osseous or soft tissue abnormality. IMPRESSION: 1. Diffuse airways thickening with more patchy retrocardiac opacity, which could reflect atelectasis or infiltrate. 2.  Aortic Atherosclerosis (ICD10-I70.0). Electronically Signed   By: Lovena Le M.D.   On: 03/21/2019 01:58   Dg Chest Port 1 View  Result Date: 02/23/2019 CLINICAL DATA:  Weakness. EXAM: PORTABLE CHEST 1 VIEW COMPARISON:  Radiograph of July 05, 2017. FINDINGS: The heart size and mediastinal contours are within normal limits. Atherosclerosis of thoracic aorta is noted. Hypoinflation of the lungs is noted with minimal bibasilar subsegmental atelectasis. No pneumothorax or pleural effusion is noted. The visualized skeletal structures are unremarkable. IMPRESSION: Hypoinflation of the lungs with  minimal bibasilar subsegmental atelectasis. Aortic Atherosclerosis (ICD10-I70.0). Electronically Signed   By: Marijo Conception M.D.   On: 02/23/2019 09:50   US Abdomen Limited Ruq  Result Date: 02/23/2019 CLINICAL DATA:  Abdominal pain.  Gallstones on CT earlier this day. EXAM: ULTRASOUND ABDOMEN LIMITED RIGHT UPPER QUADRANT COMPARISON:  Abdominal CT earlier this day. FINDINGS: Gallbladder: Physiologically distended with intraluminal gallstone. Normal wall thickness of 2 mm. No pericholecystic fluid. No sonographic Murphy sign noted by sonographer. Common bile duct: Diameter: 6 mm, normal. Liver: No focal lesion identified. Within normal limits in parenchymal echogenicity. Portal vein is patent on color Doppler imaging with normal direction of blood flow towards the liver. Other: None. IMPRESSION: Gallstones without sonographic findings of acute cholecystitis. No biliary dilatation. Electronically Signed   By: Keith Rake M.D.   On: 02/23/2019 19:27       Subjective: No acute medical events overnight Remains stable Dispo to SNF pending covid and insur  Discharge Exam: Vitals:   03/24/19 0349 03/24/19 0849  BP: (!) 136/50 (!) 125/59  Pulse: 72 76  Resp: 18 17  Temp: 98.1 F (36.7 C)   SpO2: 98% 98%   Vitals:   03/23/19 1536 03/23/19 1953 03/24/19 0349 03/24/19 0849  BP: 137/64 (!) 120/59 (!) 136/50 (!) 125/59  Pulse: 78 71 72 76  Resp: 18  18 17   Temp: 98.1 F (36.7 C) 98.3 F (36.8 C) 98.1 F (36.7 C)   TempSrc: Oral Oral Oral   SpO2: 99% 97% 98%  98%  Weight:      Height:        General: Pt is alert, awake, not in acute distress Cardiovascular: RRR, S1/S2 +, no rubs, no gallops Respiratory: CTA bilaterally, no wheezing, no rhonchi Abdominal: Soft, NT, ND, bowel sounds + Extremities: no edema, no cyanosis    The results of significant diagnostics from this hospitalization (including imaging, microbiology, ancillary and laboratory) are listed below for reference.      Microbiology: Recent Results (from the past 240 hour(s))  Blood Culture (routine x 2)     Status: None (Preliminary result)   Collection Time: 03/20/19 10:18 PM   Specimen: BLOOD RIGHT FOREARM  Result Value Ref Range Status   Specimen Description BLOOD RIGHT FOREARM  Final   Special Requests   Final    BOTTLES DRAWN AEROBIC AND ANAEROBIC Blood Culture adequate volume   Culture   Final    NO GROWTH 4 DAYS Performed at [facility], [address], [place], [place] [address]    Report Status PENDING  Incomplete  Blood Culture (routine x 2)     Status: None (Preliminary result)   Collection Time: 03/20/19 10:30 PM   Specimen: BLOOD LEFT FOREARM  Result Value Ref Range Status   Specimen Description BLOOD LEFT FOREARM  Final   Special Requests   Final    BOTTLES DRAWN AEROBIC AND ANAEROBIC Blood Culture adequate volume   Culture   Final    NO GROWTH 4 DAYS Performed at [facility], [place] [address] [place], [place] [address]    Report Status PENDING  Incomplete  SARS CORONAVIRUS 2 (TAT 6-24 HRS) Nasopharyngeal Nasopharyngeal Swab     Status: None   Collection Time: 03/21/19  1:16 AM   Specimen: Nasopharyngeal Swab  Result Value Ref Range Status   SARS Coronavirus 2 NEGATIVE NEGATIVE Final    Comment: (NOTE) SARS-CoV-2 target nucleic acids are NOT DETECTED. The SARS-CoV-2 RNA is generally detectable in upper and lower respiratory specimens during the acute phase of infection. Negative results do not preclude SARS-CoV-2 infection, do not rule out co-infections with other pathogens, and should not be used as the sole basis for treatment or other patient management decisions. Negative results must be combined with clinical observations, patient history, and epidemiological information. The expected result is Negative. Fact Sheet for Patients: SugarRoll.be Fact Sheet for Healthcare  Providers: https://www.woods-mathews.com/ This test is not yet approved or cleared by the [place] FDA and  has been authorized for detection and/or diagnosis of SARS-CoV-2 by FDA under an Emergency Use Authorization (EUA). This EUA will remain  in effect (meaning this test can be used) for the duration of the COVID-19 declaration under Section 56 4(b)(1) of the Act, 21 U.S.C. section 360bbb-3(b)(1), unless the authorization is terminated or revoked sooner. Performed at [facility], [place] [address], [place], [place] [address]   Urine culture     Status: Abnormal   Collection Time: 03/21/19  1:45 AM   Specimen: In/Out Cath Urine  Result Value Ref Range Status   Specimen Description IN/OUT CATH URINE  Final   Special Requests   Final    NONE Performed at [facility], [place] [address], [place], [place] [address]    Culture >=100,000 COLONIES/mL ESCHERICHIA COLI (A)  Final   Report Status 03/23/2019 FINAL  Final   Organism ID, Bacteria ESCHERICHIA COLI (A)  Final      Susceptibility   Escherichia coli - MIC*  AMPICILLIN >=32 RESISTANT Resistant     CEFAZOLIN 16 SENSITIVE Sensitive     CEFTRIAXONE <=1 SENSITIVE Sensitive     CIPROFLOXACIN <=0.25 SENSITIVE Sensitive     GENTAMICIN <=1 SENSITIVE Sensitive     IMIPENEM <=0.25 SENSITIVE Sensitive     NITROFURANTOIN <=16 SENSITIVE Sensitive     TRIMETH/SULFA <=20 SENSITIVE Sensitive     AMPICILLIN/SULBACTAM 16 INTERMEDIATE Intermediate     PIP/TAZO 8 SENSITIVE Sensitive     Extended ESBL NEGATIVE Sensitive     * >=100,000 COLONIES/mL ESCHERICHIA COLI     Labs: BNP (last 3 results) No results for input(s): BNP in the last 8760 hours. Basic Metabolic Panel: Recent Labs  Lab 03/20/19 2304 03/21/19 0750 03/22/19 0235 03/23/19 0831  NA 138 141 136 134*  K 4.1 3.9 4.2 3.7  CL 103 108 107 102  CO2 23 25 23 25   GLUCOSE 191* 101* 199* 239*  BUN 20 22 22 15   CREATININE 0.84 0.90 0.82 0.68  CALCIUM  8.3* 8.0* 7.6* 7.7*   Liver Function Tests: Recent Labs  Lab 03/20/19 2304 03/21/19 0750  AST 22 17  ALT 20 17  ALKPHOS 89 77  BILITOT 0.5 0.8  PROT 6.4* 5.6*  ALBUMIN 2.5* 2.2*   No results for input(s): LIPASE, AMYLASE in the last 168 hours. No results for input(s): AMMONIA in the last 168 hours. CBC: Recent Labs  Lab 03/20/19 2304 03/21/19 0750 03/22/19 0235 03/23/19 0831  WBC 23.5* 21.0* 15.0* 12.9*  NEUTROABS 20.2* 16.8*  --  9.3*  HGB 14.7 13.1 11.7* 11.3*  HCT 43.2 40.8 36.4* 35.2*  MCV 90.0 92.1 91.7 90.7  PLT 286 272 226 232   Cardiac Enzymes: No results for input(s): CKTOTAL, CKMB, CKMBINDEX, TROPONINI in the last 168 hours. BNP: Invalid input(s): POCBNP CBG: Recent Labs  Lab 03/23/19 0613 03/23/19 1116 03/23/19 1534 03/23/19 2215 03/24/19 0624  GLUCAP 128* 156* 124* 160* 72   D-Dimer No results for input(s): DDIMER in the last 72 hours. Hgb A1c No results for input(s): HGBA1C in the last 72 hours. Lipid Profile No results for input(s): CHOL, HDL, LDLCALC, TRIG, CHOLHDL, LDLDIRECT in the last 72 hours. Thyroid function studies No results for input(s): TSH, T4TOTAL, T3FREE, THYROIDAB in the last 72 hours.  Invalid input(s): FREET3 Anemia work up No results for input(s): VITAMINB12, FOLATE, FERRITIN, TIBC, IRON, RETICCTPCT in the last 72 hours. Urinalysis    Component Value Date/Time   COLORURINE YELLOW 03/20/2019 [place] 03/20/2019 2304   LABSPEC 1.020 03/20/2019 2304   PHURINE 6.5 03/20/2019 2304   GLUCOSEU NEGATIVE 03/20/2019 2304   HGBUR LARGE (A) 03/20/2019 2304   BILIRUBINUR MODERATE (A) 03/20/2019 2304   KETONESUR 15 (A) 03/20/2019 2304   PROTEINUR >300 (A) 03/20/2019 2304   UROBILINOGEN 0.2 05/29/2014 2125   NITRITE POSITIVE (A) 03/20/2019 2304   LEUKOCYTESUR LARGE (A) 03/20/2019 2304   Sepsis Labs Invalid input(s): PROCALCITONIN,  WBC,  LACTICIDVEN Microbiology Recent Results (from the past 240 hour(s))   Blood Culture (routine x 2)     Status: None (Preliminary result)   Collection Time: 03/20/19 10:18 PM   Specimen: BLOOD RIGHT FOREARM  Result Value Ref Range Status   Specimen Description BLOOD RIGHT FOREARM  Final   Special Requests   Final    BOTTLES DRAWN AEROBIC AND ANAEROBIC Blood Culture adequate volume   Culture   Final    NO GROWTH 4 DAYS Performed at [facility], [place],  [place] [address]    Report Status PENDING  Incomplete  Blood Culture (routine x 2)     Status: None (Preliminary result)   Collection Time: 03/20/19 10:30 PM   Specimen: BLOOD LEFT FOREARM  Result Value Ref Range Status   Specimen Description BLOOD LEFT FOREARM  Final   Special Requests   Final    BOTTLES DRAWN AEROBIC AND ANAEROBIC Blood Culture adequate volume   Culture   Final    NO GROWTH 4 DAYS Performed at [facility], [place] [address], [place], [place] [address]    Report Status PENDING  Incomplete  SARS CORONAVIRUS 2 (TAT 6-24 HRS) Nasopharyngeal Nasopharyngeal Swab     Status: None   Collection Time: 03/21/19  1:16 AM   Specimen: Nasopharyngeal Swab  Result Value Ref Range Status   SARS Coronavirus 2 NEGATIVE NEGATIVE Final    Comment: (NOTE) SARS-CoV-2 target nucleic acids are NOT DETECTED. The SARS-CoV-2 RNA is generally detectable in upper and lower respiratory specimens during the acute phase of infection. Negative results do not preclude SARS-CoV-2 infection, do not rule out co-infections with other pathogens, and should not be used as the sole basis for treatment or other patient management decisions. Negative results must be combined with clinical observations, patient history, and epidemiological information. The expected result is Negative. Fact Sheet for Patients: SugarRoll.be Fact Sheet for Healthcare Providers: https://www.woods-mathews.com/ This test is not yet approved or cleared by the [place] FDA  and  has been authorized for detection and/or diagnosis of SARS-CoV-2 by FDA under an Emergency Use Authorization (EUA). This EUA will remain  in effect (meaning this test can be used) for the duration of the COVID-19 declaration under Section 56 4(b)(1) of the Act, 21 U.S.C. section 360bbb-3(b)(1), unless the authorization is terminated or revoked sooner. Performed at [facility], [place] [address] [place], [place] [address]   Urine culture     Status: Abnormal   Collection Time: 03/21/19  1:45 AM   Specimen: In/Out Cath Urine  Result Value Ref Range Status   Specimen Description IN/OUT CATH URINE  Final   Special Requests   Final    NONE Performed at [facility], [place] [address] [place], [place] [address]    Culture >=100,000 COLONIES/mL ESCHERICHIA COLI (A)  Final   Report Status 03/23/2019 FINAL  Final   Organism ID, Bacteria ESCHERICHIA COLI (A)  Final      Susceptibility   Escherichia coli - MIC*    AMPICILLIN >=32 RESISTANT Resistant     CEFAZOLIN 16 SENSITIVE Sensitive     CEFTRIAXONE <=1 SENSITIVE Sensitive     CIPROFLOXACIN <=0.25 SENSITIVE Sensitive     GENTAMICIN <=1 SENSITIVE Sensitive     IMIPENEM <=0.25 SENSITIVE Sensitive     NITROFURANTOIN <=16 SENSITIVE Sensitive     TRIMETH/SULFA <=20 SENSITIVE Sensitive     AMPICILLIN/SULBACTAM 16 INTERMEDIATE Intermediate     PIP/TAZO 8 SENSITIVE Sensitive     Extended ESBL NEGATIVE Sensitive     * >=100,000 COLONIES/mL ESCHERICHIA COLI     Time coordinating discharge: Over 30 minutes  SIGNED:   Nicolette Bang, MD  [facility] 03/24/2019, 9:34 AM Pager   If 7PM-7AM, please contact night-coverage [email or web address] Password [email or web address]

## 2019-03-25 LAB — CULTURE, BLOOD (ROUTINE X 2)
Culture: NO GROWTH
Culture: NO GROWTH
Special Requests: ADEQUATE
Special Requests: ADEQUATE

## 2019-04-30 ENCOUNTER — Inpatient Hospital Stay (HOSPITAL_COMMUNITY)
Admission: EM | Admit: 2019-04-30 | Discharge: 2019-05-06 | DRG: 478 | Disposition: A | Discharge status: Home Health | Payer: Medicare Other | Attending: Internal Medicine | Admitting: Internal Medicine

## 2019-04-30 ENCOUNTER — Other Ambulatory Visit: Payer: Self-pay

## 2019-04-30 ENCOUNTER — Emergency Department (HOSPITAL_COMMUNITY): Payer: Medicare Other

## 2019-04-30 ENCOUNTER — Inpatient Hospital Stay (HOSPITAL_COMMUNITY): Payer: Medicare Other

## 2019-04-30 ENCOUNTER — Encounter (HOSPITAL_COMMUNITY): Payer: Self-pay | Admitting: Radiology

## 2019-04-30 DIAGNOSIS — Z5329 Procedure and treatment not carried out because of patient's decision for other reasons: Secondary | ICD-10-CM | POA: Diagnosis present

## 2019-04-30 DIAGNOSIS — S32039D Unspecified fracture of third lumbar vertebra, subsequent encounter for fracture with routine healing: Secondary | ICD-10-CM | POA: Diagnosis not present

## 2019-04-30 DIAGNOSIS — K5909 Other constipation: Secondary | ICD-10-CM | POA: Diagnosis present

## 2019-04-30 DIAGNOSIS — M4626 Osteomyelitis of vertebra, lumbar region: Secondary | ICD-10-CM | POA: Diagnosis present

## 2019-04-30 DIAGNOSIS — Z8249 Family history of ischemic heart disease and other diseases of the circulatory system: Secondary | ICD-10-CM

## 2019-04-30 DIAGNOSIS — Z9221 Personal history of antineoplastic chemotherapy: Secondary | ICD-10-CM

## 2019-04-30 DIAGNOSIS — Z887 Allergy status to serum and vaccine status: Secondary | ICD-10-CM | POA: Diagnosis not present

## 2019-04-30 DIAGNOSIS — Z794 Long term (current) use of insulin: Secondary | ICD-10-CM

## 2019-04-30 DIAGNOSIS — R339 Retention of urine, unspecified: Secondary | ICD-10-CM | POA: Diagnosis not present

## 2019-04-30 DIAGNOSIS — R441 Visual hallucinations: Secondary | ICD-10-CM | POA: Diagnosis present

## 2019-04-30 DIAGNOSIS — N35919 Unspecified urethral stricture, male, unspecified site: Secondary | ICD-10-CM | POA: Diagnosis not present

## 2019-04-30 DIAGNOSIS — Y929 Unspecified place or not applicable: Secondary | ICD-10-CM

## 2019-04-30 DIAGNOSIS — Z8673 Personal history of transient ischemic attack (TIA), and cerebral infarction without residual deficits: Secondary | ICD-10-CM | POA: Diagnosis not present

## 2019-04-30 DIAGNOSIS — N1330 Unspecified hydronephrosis: Secondary | ICD-10-CM | POA: Diagnosis present

## 2019-04-30 DIAGNOSIS — M4647 Discitis, unspecified, lumbosacral region: Secondary | ICD-10-CM | POA: Diagnosis not present

## 2019-04-30 DIAGNOSIS — Z981 Arthrodesis status: Secondary | ICD-10-CM

## 2019-04-30 DIAGNOSIS — Z880 Allergy status to penicillin: Secondary | ICD-10-CM | POA: Diagnosis not present

## 2019-04-30 DIAGNOSIS — S32019D Unspecified fracture of first lumbar vertebra, subsequent encounter for fracture with routine healing: Secondary | ICD-10-CM

## 2019-04-30 DIAGNOSIS — R8271 Bacteriuria: Secondary | ICD-10-CM | POA: Diagnosis not present

## 2019-04-30 DIAGNOSIS — T43295A Adverse effect of other antidepressants, initial encounter: Secondary | ICD-10-CM | POA: Diagnosis present

## 2019-04-30 DIAGNOSIS — Q5564 Hidden penis: Secondary | ICD-10-CM

## 2019-04-30 DIAGNOSIS — Z20828 Contact with and (suspected) exposure to other viral communicable diseases: Secondary | ICD-10-CM | POA: Diagnosis present

## 2019-04-30 DIAGNOSIS — E119 Type 2 diabetes mellitus without complications: Secondary | ICD-10-CM

## 2019-04-30 DIAGNOSIS — E11649 Type 2 diabetes mellitus with hypoglycemia without coma: Secondary | ICD-10-CM | POA: Diagnosis present

## 2019-04-30 DIAGNOSIS — I10 Essential (primary) hypertension: Secondary | ICD-10-CM | POA: Diagnosis present

## 2019-04-30 DIAGNOSIS — E1169 Type 2 diabetes mellitus with other specified complication: Secondary | ICD-10-CM | POA: Diagnosis present

## 2019-04-30 DIAGNOSIS — R338 Other retention of urine: Secondary | ICD-10-CM | POA: Diagnosis present

## 2019-04-30 DIAGNOSIS — M47816 Spondylosis without myelopathy or radiculopathy, lumbar region: Secondary | ICD-10-CM | POA: Diagnosis present

## 2019-04-30 DIAGNOSIS — Z66 Do not resuscitate: Secondary | ICD-10-CM | POA: Diagnosis present

## 2019-04-30 DIAGNOSIS — E1142 Type 2 diabetes mellitus with diabetic polyneuropathy: Secondary | ICD-10-CM | POA: Diagnosis present

## 2019-04-30 DIAGNOSIS — M545 Low back pain, unspecified: Secondary | ICD-10-CM

## 2019-04-30 DIAGNOSIS — N320 Bladder-neck obstruction: Secondary | ICD-10-CM | POA: Diagnosis not present

## 2019-04-30 DIAGNOSIS — Z8572 Personal history of non-Hodgkin lymphomas: Secondary | ICD-10-CM

## 2019-04-30 DIAGNOSIS — Z923 Personal history of irradiation: Secondary | ICD-10-CM

## 2019-04-30 DIAGNOSIS — F329 Major depressive disorder, single episode, unspecified: Secondary | ICD-10-CM | POA: Diagnosis not present

## 2019-04-30 DIAGNOSIS — L8990 Pressure ulcer of unspecified site, unspecified stage: Secondary | ICD-10-CM | POA: Diagnosis present

## 2019-04-30 DIAGNOSIS — N35911 Unspecified urethral stricture, male, meatal: Secondary | ICD-10-CM

## 2019-04-30 DIAGNOSIS — R410 Disorientation, unspecified: Secondary | ICD-10-CM

## 2019-04-30 DIAGNOSIS — Z7982 Long term (current) use of aspirin: Secondary | ICD-10-CM

## 2019-04-30 DIAGNOSIS — M4627 Osteomyelitis of vertebra, lumbosacral region: Secondary | ICD-10-CM | POA: Diagnosis present

## 2019-04-30 DIAGNOSIS — R8281 Pyuria: Secondary | ICD-10-CM | POA: Diagnosis not present

## 2019-04-30 DIAGNOSIS — Z87440 Personal history of urinary (tract) infections: Secondary | ICD-10-CM

## 2019-04-30 DIAGNOSIS — N39490 Overflow incontinence: Secondary | ICD-10-CM | POA: Diagnosis present

## 2019-04-30 DIAGNOSIS — L280 Lichen simplex chronicus: Secondary | ICD-10-CM | POA: Diagnosis present

## 2019-04-30 DIAGNOSIS — Z87891 Personal history of nicotine dependence: Secondary | ICD-10-CM | POA: Diagnosis not present

## 2019-04-30 DIAGNOSIS — I4510 Unspecified right bundle-branch block: Secondary | ICD-10-CM | POA: Diagnosis present

## 2019-04-30 DIAGNOSIS — L89151 Pressure ulcer of sacral region, stage 1: Secondary | ICD-10-CM | POA: Diagnosis present

## 2019-04-30 DIAGNOSIS — W19XXXD Unspecified fall, subsequent encounter: Secondary | ICD-10-CM | POA: Diagnosis present

## 2019-04-30 DIAGNOSIS — M4856XD Collapsed vertebra, not elsewhere classified, lumbar region, subsequent encounter for fracture with routine healing: Secondary | ICD-10-CM | POA: Diagnosis not present

## 2019-04-30 DIAGNOSIS — R112 Nausea with vomiting, unspecified: Secondary | ICD-10-CM

## 2019-04-30 DIAGNOSIS — M48061 Spinal stenosis, lumbar region without neurogenic claudication: Secondary | ICD-10-CM | POA: Diagnosis present

## 2019-04-30 DIAGNOSIS — E440 Moderate protein-calorie malnutrition: Secondary | ICD-10-CM | POA: Diagnosis present

## 2019-04-30 DIAGNOSIS — R32 Unspecified urinary incontinence: Secondary | ICD-10-CM | POA: Diagnosis not present

## 2019-04-30 DIAGNOSIS — Z89421 Acquired absence of other right toe(s): Secondary | ICD-10-CM

## 2019-04-30 DIAGNOSIS — T43225A Adverse effect of selective serotonin reuptake inhibitors, initial encounter: Secondary | ICD-10-CM | POA: Diagnosis present

## 2019-04-30 DIAGNOSIS — I4891 Unspecified atrial fibrillation: Secondary | ICD-10-CM | POA: Diagnosis present

## 2019-04-30 DIAGNOSIS — M4649 Discitis, unspecified, multiple sites in spine: Secondary | ICD-10-CM | POA: Diagnosis present

## 2019-04-30 DIAGNOSIS — I69351 Hemiplegia and hemiparesis following cerebral infarction affecting right dominant side: Secondary | ICD-10-CM

## 2019-04-30 DIAGNOSIS — N35819 Other urethral stricture, male, unspecified site: Secondary | ICD-10-CM | POA: Diagnosis present

## 2019-04-30 DIAGNOSIS — M4624 Osteomyelitis of vertebra, thoracic region: Secondary | ICD-10-CM | POA: Diagnosis not present

## 2019-04-30 DIAGNOSIS — D72829 Elevated white blood cell count, unspecified: Secondary | ICD-10-CM | POA: Diagnosis not present

## 2019-04-30 DIAGNOSIS — Z79891 Long term (current) use of opiate analgesic: Secondary | ICD-10-CM

## 2019-04-30 DIAGNOSIS — E162 Hypoglycemia, unspecified: Secondary | ICD-10-CM

## 2019-04-30 DIAGNOSIS — R443 Hallucinations, unspecified: Secondary | ICD-10-CM | POA: Diagnosis not present

## 2019-04-30 DIAGNOSIS — G061 Intraspinal abscess and granuloma: Secondary | ICD-10-CM | POA: Diagnosis not present

## 2019-04-30 DIAGNOSIS — E1140 Type 2 diabetes mellitus with diabetic neuropathy, unspecified: Secondary | ICD-10-CM | POA: Diagnosis not present

## 2019-04-30 DIAGNOSIS — M25511 Pain in right shoulder: Secondary | ICD-10-CM | POA: Diagnosis present

## 2019-04-30 DIAGNOSIS — M4646 Discitis, unspecified, lumbar region: Secondary | ICD-10-CM | POA: Diagnosis not present

## 2019-04-30 DIAGNOSIS — Z6825 Body mass index (BMI) 25.0-25.9, adult: Secondary | ICD-10-CM

## 2019-04-30 DIAGNOSIS — Z7401 Bed confinement status: Secondary | ICD-10-CM

## 2019-04-30 DIAGNOSIS — K920 Hematemesis: Secondary | ICD-10-CM

## 2019-04-30 DIAGNOSIS — R52 Pain, unspecified: Secondary | ICD-10-CM

## 2019-04-30 DIAGNOSIS — Z7902 Long term (current) use of antithrombotics/antiplatelets: Secondary | ICD-10-CM

## 2019-04-30 DIAGNOSIS — Z888 Allergy status to other drugs, medicaments and biological substances status: Secondary | ICD-10-CM | POA: Diagnosis not present

## 2019-04-30 DIAGNOSIS — S32030A Wedge compression fracture of third lumbar vertebra, initial encounter for closed fracture: Secondary | ICD-10-CM | POA: Diagnosis present

## 2019-04-30 DIAGNOSIS — R442 Other hallucinations: Secondary | ICD-10-CM | POA: Diagnosis present

## 2019-04-30 LAB — CBC WITH DIFFERENTIAL/PLATELET
Abs Immature Granulocytes: 0.15 10*3/uL — ABNORMAL HIGH (ref 0.00–0.07)
Basophils Absolute: 0.1 10*3/uL (ref 0.0–0.1)
Basophils Relative: 0 %
Eosinophils Absolute: 0.3 10*3/uL (ref 0.0–0.5)
Eosinophils Relative: 2 %
HCT: 42.2 % (ref 39.0–52.0)
Hemoglobin: 13.9 g/dL (ref 13.0–17.0)
Immature Granulocytes: 1 %
Lymphocytes Relative: 9 %
Lymphs Abs: 1.5 10*3/uL (ref 0.7–4.0)
MCH: 29.2 pg (ref 26.0–34.0)
MCHC: 32.9 g/dL (ref 30.0–36.0)
MCV: 88.7 fL (ref 80.0–100.0)
Monocytes Absolute: 1.4 10*3/uL — ABNORMAL HIGH (ref 0.1–1.0)
Monocytes Relative: 8 %
Neutro Abs: 13.3 10*3/uL — ABNORMAL HIGH (ref 1.7–7.7)
Neutrophils Relative %: 80 %
Platelets: 292 10*3/uL (ref 150–400)
RBC: 4.76 MIL/uL (ref 4.22–5.81)
RDW: 13.4 % (ref 11.5–15.5)
WBC: 16.8 10*3/uL — ABNORMAL HIGH (ref 4.0–10.5)
nRBC: 0 % (ref 0.0–0.2)

## 2019-04-30 LAB — URINALYSIS, ROUTINE W REFLEX MICROSCOPIC
Bilirubin Urine: NEGATIVE
Glucose, UA: NEGATIVE mg/dL
Ketones, ur: NEGATIVE mg/dL
Nitrite: NEGATIVE
Protein, ur: NEGATIVE mg/dL
Specific Gravity, Urine: 1.012 (ref 1.005–1.030)
WBC, UA: >50 WBC/hpf — ABNORMAL HIGH (ref 0–5)
pH: 6 (ref 5.0–8.0)

## 2019-04-30 LAB — LIPASE, BLOOD: Lipase: 22 U/L (ref 11–51)

## 2019-04-30 LAB — COMPREHENSIVE METABOLIC PANEL
ALT: 36 U/L (ref 0–44)
AST: 28 U/L (ref 15–41)
Albumin: 2.4 g/dL — ABNORMAL LOW (ref 3.5–5.0)
Alkaline Phosphatase: 90 U/L (ref 38–126)
Anion gap: 11 (ref 5–15)
BUN: 17 mg/dL (ref 8–23)
CO2: 25 mmol/L (ref 22–32)
Calcium: 8.3 mg/dL — ABNORMAL LOW (ref 8.9–10.3)
Chloride: 103 mmol/L (ref 98–111)
Creatinine, Ser: 0.89 mg/dL (ref 0.61–1.24)
GFR calc Af Amer: >60 mL/min (ref >60)
GFR calc non Af Amer: >60 mL/min (ref >60)
Glucose, Bld: 63 mg/dL — ABNORMAL LOW (ref 70–99)
Potassium: 4.1 mmol/L (ref 3.5–5.1)
Sodium: 139 mmol/L (ref 135–145)
Total Bilirubin: 0.6 mg/dL (ref 0.3–1.2)
Total Protein: 6.2 g/dL — ABNORMAL LOW (ref 6.5–8.1)

## 2019-04-30 LAB — CBG MONITORING, ED
Glucose-Capillary: 58 mg/dL — ABNORMAL LOW (ref 70–99)
Glucose-Capillary: 69 mg/dL — ABNORMAL LOW (ref 70–99)
Glucose-Capillary: 101 mg/dL — ABNORMAL HIGH (ref 70–99)
Glucose-Capillary: 103 mg/dL — ABNORMAL HIGH (ref 70–99)
Glucose-Capillary: 34 mg/dL — CL (ref 70–99)
Glucose-Capillary: 39 mg/dL — CL (ref 70–99)
Glucose-Capillary: 152 mg/dL — ABNORMAL HIGH (ref 70–99)

## 2019-04-30 LAB — SARS CORONAVIRUS 2 (TAT 6-24 HRS): SARS Coronavirus 2: NEGATIVE

## 2019-04-30 LAB — RAPID URINE DRUG SCREEN, HOSP PERFORMED
Amphetamines: NOT DETECTED
Barbiturates: NOT DETECTED
Benzodiazepines: NOT DETECTED
Cocaine: NOT DETECTED
Opiates: NOT DETECTED
Tetrahydrocannabinol: NOT DETECTED

## 2019-04-30 LAB — GLUCOSE, CAPILLARY: Glucose-Capillary: 163 mg/dL — ABNORMAL HIGH (ref 70–99)

## 2019-04-30 LAB — C-REACTIVE PROTEIN: CRP: 10.3 mg/dL — ABNORMAL HIGH (ref <1.0)

## 2019-04-30 MED ORDER — ASPIRIN EC 81 MG PO TBEC
81.0000 mg | DELAYED_RELEASE_TABLET | Freq: Every day | ORAL | Status: DC
  Administered 2019-04-30 – 2019-05-06 (×7): 81 mg via ORAL
  Filled 2019-04-30 (×7): qty 1

## 2019-04-30 MED ORDER — GADOBUTROL 1 MMOL/ML IV SOLN
7.0000 mL | Freq: Once | INTRAVENOUS | Status: AC | PRN
  Administered 2019-04-30: 7 mL via INTRAVENOUS

## 2019-04-30 MED ORDER — CLOPIDOGREL BISULFATE 75 MG PO TABS
75.0000 mg | ORAL_TABLET | Freq: Every day | ORAL | Status: DC
  Administered 2019-04-30 – 2019-05-06 (×7): 75 mg via ORAL
  Filled 2019-04-30 (×8): qty 1

## 2019-04-30 MED ORDER — ENOXAPARIN SODIUM 40 MG/0.4ML ~~LOC~~ SOLN: 40.0000 mg | SUBCUTANEOUS | Status: DC

## 2019-04-30 MED ORDER — AMLODIPINE BESYLATE 5 MG PO TABS
5.0000 mg | ORAL_TABLET | Freq: Two times a day (BID) | ORAL | Status: DC
  Administered 2019-04-30 – 2019-05-06 (×13): 5 mg via ORAL
  Filled 2019-04-30 (×12): qty 1

## 2019-04-30 MED ORDER — METOPROLOL TARTRATE 12.5 MG HALF TABLET
12.5000 mg | ORAL_TABLET | Freq: Two times a day (BID) | ORAL | Status: DC
  Administered 2019-05-01 – 2019-05-06 (×11): 12.5 mg via ORAL
  Filled 2019-04-30 (×12): qty 1

## 2019-04-30 MED ORDER — TAMSULOSIN HCL 0.4 MG PO CAPS
0.4000 mg | ORAL_CAPSULE | Freq: Every day | ORAL | Status: DC
  Filled 2019-04-30 (×7): qty 1

## 2019-04-30 MED ORDER — SENNA 8.6 MG PO TABS
1.0000 | ORAL_TABLET | Freq: Every day | ORAL | Status: DC
  Administered 2019-04-30 – 2019-05-04 (×4): 8.6 mg via ORAL
  Filled 2019-04-30 (×5): qty 1

## 2019-04-30 MED ORDER — IOHEXOL 300 MG/ML  SOLN
100.0000 mL | Freq: Once | INTRAMUSCULAR | Status: AC | PRN
  Administered 2019-04-30: 100 mL via INTRAVENOUS

## 2019-04-30 MED ORDER — SIMVASTATIN 20 MG PO TABS
20.0000 mg | ORAL_TABLET | Freq: Every day | ORAL | Status: DC
  Administered 2019-04-30 – 2019-05-05 (×6): 20 mg via ORAL
  Filled 2019-04-30 (×6): qty 1

## 2019-04-30 MED ORDER — METOPROLOL SUCCINATE ER 25 MG PO TB24: 12.5000 mg | ORAL_TABLET | Freq: Every day | ORAL | Status: DC

## 2019-04-30 MED ORDER — DEXTROSE 50 % IV SOLN
INTRAVENOUS | Status: AC
  Administered 2019-04-30: 25 mL
  Filled 2019-04-30: qty 50

## 2019-04-30 MED ORDER — ACETAMINOPHEN 500 MG PO TABS
500.0000 mg | ORAL_TABLET | Freq: Four times a day (QID) | ORAL | Status: DC | PRN
  Administered 2019-05-04: 500 mg via ORAL
  Filled 2019-04-30: qty 1

## 2019-04-30 MED ORDER — DEXTROSE 50 % IV SOLN: 25.0000 mL | Freq: Once | INTRAVENOUS | Status: DC

## 2019-04-30 MED ORDER — ONDANSETRON HCL 4 MG/2ML IJ SOLN
4.0000 mg | Freq: Once | INTRAMUSCULAR | Status: DC
  Filled 2019-04-30: qty 2

## 2019-04-30 MED ORDER — POLYETHYLENE GLYCOL 3350 17 G PO PACK
17.0000 g | PACK | Freq: Every day | ORAL | Status: DC
  Administered 2019-05-02 – 2019-05-03 (×2): 17 g via ORAL
  Filled 2019-04-30 (×5): qty 1

## 2019-04-30 NOTE — ED Provider Notes (Signed)
[facility] EMERGENCY DEPARTMENT Provider Note   CSN: [id number] Arrival date & time: 04/30/19  [id number]     History   Chief Complaint Chief Complaint  Patient presents with   abnormal stools   Emesis    HPI [name] is a 78 y.o. male who presents emergency department chief complaint of vomiting and hallucinations.  Patient has a past medical history of atrial fibrillation, B-cell lymphoma, Charcot joint, diabetes, peripheral neuropathy, history of stroke, urethral stricture, urinary retention, and overflow incontinence..  Had a recent compression fractures of the spine and wearing a TLSO brace.  There is a level 5 caveat due to delirium. Hx gathered by Review of EMR, and phone conversation with the patient's daughter. Patient began vomiting after eating fish last evening.  He states that it was dark in color however he had drank some prune juice because he was "backed up."  Patient states that he frequently gets constipated, takes a laxative about every 3 days and moves his bowels.  Patient made a bowel movement last night.  He admits to having sensory hallucinations of his wife patting his head.  He states that his wife is dead.  According to the patient's daughter [name], the patient began acting confused last night. He was hallucinating and thought that there was a rock in his bed, later he began to think that there were cookies in his bed and that he was holding safety pins.  She states that she checked them every time and none of these things occurred.  She states that he also began coughing last night and clearing phlegm.  She did not actively see him vomit but later came in to find that he had been vomiting dark-colored vomitus into his trash can.  She does states that he has been very constipated and has been making tablespoon size firm bowel movements daily without a good void of his bowels.  She has been giving him prune juice and MiraLAX without significant  change. The patient's daughter was unaware of his history of urinary retention and overflow incontinence but does state that he has had urinary incontinence for very long time.  She states that they were supposed to follow with urology this coming week however he has been 2-week to get out of the house.  He has been staying with her since [facility] she states it has been very difficult secondary to his physical deconditioning.  She is also concerned that the patient may have had another stroke because he has been exhibiting weakness in his right arm and right leg.  This was also expressed this concern by his physical therapist however the patient does complain of right shoulder pain limiting him.  The patient acknowledges that his physical therapist massaged his right trapezius which gave him improved range of motion of the arm. HPI  Past Medical History:  Diagnosis Date   A-fib ([place])    B-cell lymphoma ([place]) 10/09/2011   B-cell lymphoma (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; xrt comp 03/2010   SIRS (systemic inflammatory response syndrome) ([place]) 09/2015   Stroke ([place])    Weakness     Patient Active Problem List   Diagnosis Date Noted   UTI (urinary tract infection) 03/21/2019   Weakness generalized 02/23/2019   Lumbar compression fracture (  [place]) 02/08/2019   Closed compression fracture of L3 lumbar vertebra, initial encounter ([place]) 02/08/2019   Laryngitis    Acute non-recurrent sinusitis    Sore throat    Cough    Overflow incontinence of urine    Urinary retention    Acute lower UTI    Urinary urgency    Hypoalbuminemia due to protein-calorie malnutrition (HCC)    Type 2 diabetes mellitus with peripheral neuropathy (HCC)    Benign essential HTN    H/O urethral stricture    Acute blood loss anemia     Poorly controlled type 2 diabetes mellitus with peripheral neuropathy ([place])    Debility 06/28/2017   Personal history of urethral stricture 06/23/2017   Rhabdomyolysis 06/23/2017   Fever 09/23/2015   SIRS (systemic inflammatory response syndrome) ([place]) 09/23/2015   Near syncope 09/23/2015   PAF (paroxysmal atrial fibrillation) ([place]) 09/23/2015   Diabetes mellitus type 2, uncontrolled ([place]) 09/23/2015   Stenosis of artery (HCC)    Vertebrobasilar artery syndrome    Orthostatic hypotension    Insulin dependent diabetes mellitus    Falls frequently    Weakness 08/15/2015   Cellulitis of right upper arm 06/02/2014   Sepsis secondary to UTI ([place])    Encephalopathy 05/29/2014   Sepsis ([place]) 05/29/2014   Lower urinary tract infectious disease 09/16/2013   Recurrent falls 09/16/2013   History of CVA (cerebrovascular accident) 09/16/2013   Leukocytosis 09/16/2013   Thrombocytopenia ([place]) 09/16/2013   B-cell lymphoma ([place]) 10/09/2011   Diabetes mellitus ([place]) 10/09/2011   Hypertension 10/09/2011   A-fib ([place])     Past Surgical History:  Procedure Laterality Date   ANKLE FUSION Left    CATARACT EXTRACTION     CIRCUMCISION     TOE AMPUTATION     right foot third toe    URETHRA SURGERY          Home Medications    Prior to Admission medications   Medication Sig Start Date End Date Taking? Authorizing Provider  acetaminophen (TYLENOL) 500 MG tablet Take 500 mg by mouth every 6 (six) hours as needed for mild pain.   Yes [provider]  amLODipine (NORVASC) 5 MG tablet Take 1 tablet (5 mg total) by mouth 2 (two) times daily. Patient taking differently: Take 5 mg by mouth daily.  03/24/19 04/30/19 Yes Spongberg, Audie Pinto, MD  aspirin EC 81 MG EC tablet Take 1 tablet (81 mg total) by mouth daily. Patient taking differently: Take 81 mg by mouth every evening.  08/19/15  Yes Florencia Reasons, MD  bisacodyl (DULCOLAX) 5 MG EC tablet Take 2 tablets  (10 mg total) by mouth daily as needed for moderate constipation. 03/24/19  Yes Spongberg, Audie Pinto, MD  buPROPion (WELLBUTRIN XL) 150 MG 24 hr tablet Take 150 mg by mouth at bedtime. 04/10/19  Yes [provider]  clopidogrel (PLAVIX) 75 MG tablet Take 75 mg by mouth daily.   Yes [provider]  HYDROcodone-acetaminophen (NORCO/VICODIN) 5-325 MG tablet Take 1 tablet by mouth every 4 (four) hours as needed for severe pain. 03/24/19  Yes Spongberg, Audie Pinto, MD  insulin degludec (TRESIBA FLEXTOUCH) 100 UNIT/ML SOPN FlexTouch Pen Inject 16 Units into the skin daily.   Yes [provider]  lisinopril (ZESTRIL) 2.5 MG tablet Take 1 tablet (2.5 mg total) by mouth daily. 03/25/19 04/30/19 Yes Spongberg, Audie Pinto, MD  metoprolol tartrate (LOPRESSOR) 25 MG tablet Take 0.5 tablets (12.5 mg total) by mouth 2 (two) times daily. 03/24/19 04/30/19  Yes Spongberg, Audie Pinto, MD  sertraline (ZOLOFT) 50 MG tablet Take 50 mg by mouth at bedtime. 04/10/19  Yes [provider]  simvastatin (ZOCOR) 20 MG tablet Take 1 tablet (20 mg total) by mouth at bedtime. 07/09/17  Yes Angiulli, Lavon Paganini, PA-C  insulin glargine (LANTUS) 100 UNIT/ML injection Inject 0.12 mLs (12 Units total) into the skin daily. Patient not taking: Reported on 04/30/2019 03/25/19   Marcell Anger, MD    Family History Family History  Problem Relation Age of Onset   Hypertension Mother    Hypertension Father     Social History Social History   Tobacco Use   Smoking status: Former Smoker    Packs/day: 2.00    Years: 35.00    Pack years: 70.00    Quit date: 06/03/1991    Years since quitting: 27.9   Smokeless tobacco: Former Systems developer    Quit date: 06/26/1990  Substance Use Topics   Alcohol use: No   Drug use: No     Allergies   Prevnar [pneumococcal 13-val conj vacc], Actos [pioglitazone hydrochloride], Bupropion, Hydrochlorothiazide, Sertraline, and Penicillins   Review  of Systems Review of Systems Ten systems reviewed and are negative for acute change, except as noted in the HPI.    Physical Exam Updated Vital Signs BP 140/83    Pulse 61    Temp 97.7 F (36.5 C) (Oral)    Resp 14    Ht 5\' 9"  (1.753 m)    Wt 77.1 kg    SpO2 99%    BMI 25.10 kg/m   Physical Exam Vitals signs and nursing note reviewed.  Constitutional:      General: He is not in acute distress.    Appearance: He is well-developed. He is not diaphoretic.  HENT:     Head: Normocephalic and atraumatic.  Eyes:     General: No scleral icterus.    Conjunctiva/sclera: Conjunctivae normal.  Neck:     Musculoskeletal: Normal range of motion and neck supple.  Cardiovascular:     Rate and Rhythm: Normal rate and regular rhythm.     Heart sounds: Normal heart sounds.     Comments: Pulses audible by doppler in the feet Pulmonary:     Effort: Pulmonary effort is normal. No respiratory distress.     Breath sounds: Normal breath sounds.  Abdominal:     General: There is distension.     Palpations: Abdomen is soft.     Tenderness: There is no abdominal tenderness. There is no guarding.     Comments: Informal bedside ultrasound shows markedly distended bladder up to the umbilicus  Skin:    General: Skin is warm and dry.  Neurological:     Mental Status: He is alert.  Psychiatric:        Behavior: Behavior normal.      ED Treatments / Results  Labs (all labs ordered are listed, but only abnormal results are displayed) Labs Reviewed  CBC WITH DIFFERENTIAL/PLATELET - Abnormal; Notable for the following components:      Result Value   WBC 16.8 (*)    Neutro Abs 13.3 (*)    Monocytes Absolute 1.4 (*)    Abs Immature Granulocytes 0.15 (*)    All other components within normal limits  COMPREHENSIVE METABOLIC PANEL - Abnormal; Notable for the following components:   Glucose, Bld 63 (*)    Calcium 8.3 (*)    Total Protein 6.2 (*)    Albumin 2.4 (*)  All other components within  normal limits  URINALYSIS, ROUTINE W REFLEX MICROSCOPIC - Abnormal; Notable for the following components:   APPearance HAZY (*)    Hgb urine dipstick SMALL (*)    Leukocytes,Ua LARGE (*)    WBC, UA >50 (*)    Bacteria, UA RARE (*)    All other components within normal limits  CBG MONITORING, ED - Abnormal; Notable for the following components:   Glucose-Capillary 58 (*)    All other components within normal limits  CBG MONITORING, ED - Abnormal; Notable for the following components:   Glucose-Capillary 69 (*)    All other components within normal limits  CBG MONITORING, ED - Abnormal; Notable for the following components:   Glucose-Capillary 101 (*)    All other components within normal limits  CBG MONITORING, ED - Abnormal; Notable for the following components:   Glucose-Capillary 103 (*)    All other components within normal limits  URINE CULTURE  SARS CORONAVIRUS 2 (TAT 6-24 HRS)  LIPASE, BLOOD  CBG MONITORING, ED  CBG MONITORING, ED  CBG MONITORING, ED    EKG EKG Interpretation  Date/Time:  Sunday April 30 2019 06:30:45 EST Ventricular Rate:  60 PR Interval:    QRS Duration: 136 QT Interval:  456 QTC Calculation: 456 R Axis:   -71 Text Interpretation: Sinus rhythm RBBB and LAFB No significant change since last tracing Confirmed by Pryor Curia [phone]) on 04/30/2019 6:32:57 AM   Radiology Dg Chest 1 View  Result Date: 04/30/2019 CLINICAL DATA:  Chest pain and shortness of breath EXAM: CHEST  1 VIEW COMPARISON:  None. FINDINGS: Monitoring leads overlie the patient. Normal cardiac and mediastinal contours. Elevation right hemidiaphragm. No consolidative pulmonary opacities. No pleural effusion or pneumothorax. IMPRESSION: Low lung volumes.  No acute cardiopulmonary process. Electronically Signed   By: Lovey Newcomer M.D.   On: 04/30/2019 10:18   Ct Head Wo Contrast  Result Date: 04/30/2019 CLINICAL DATA:  Altered level of consciousness (LOC), unexplained. EXAM: CT  HEAD WITHOUT CONTRAST TECHNIQUE: Contiguous axial images were obtained from the base of the skull through the vertex without intravenous contrast. COMPARISON:  Brain MRI 06/25/2017, head CT 09/23/2015 FINDINGS: Brain: No evidence of acute intracranial hemorrhage. No demarcated cortical infarction. No evidence of intracranial mass. No midline shift or extra-axial fluid collection. Moderate/advanced chronic small vessel ischemic disease has progressed since MRI 06/25/2017, most notably along the lateral aspect of the left frontal horn and left caudate head. Redemonstrated chronic infarct within the left cerebellum. Moderate generalized parenchymal atrophy Vascular: No hyperdense vessel.  Atherosclerotic calcifications. Skull: No calvarial fracture or suspicious osseous lesion. Sinuses/Orbits: Visualized orbits demonstrate no acute abnormality. Trace fluid within a posterior right ethmoid air cell. No significant mastoid effusion. IMPRESSION: 1. No evidence of acute intracranial abnormality. 2. Moderate to advanced chronic small vessel ischemic disease has progressed from prior MRI 06/25/2017. Redemonstrated chronic left cerebellar infarct. 3. Moderate generalized parenchymal atrophy. Electronically Signed   By: Kellie Simmering DO   On: 04/30/2019 09:21   Ct Abdomen Pelvis W Contrast  Result Date: 04/30/2019 CLINICAL DATA:  Abdominal distension. Nausea and vomiting. EXAM: CT ABDOMEN AND PELVIS WITH CONTRAST TECHNIQUE: Multidetector CT imaging of the abdomen and pelvis was performed using the standard protocol following bolus administration of intravenous contrast. CONTRAST:  186mL OMNIPAQUE IOHEXOL 300 MG/ML  SOLN COMPARISON:  CT abdomen pelvis 02/23/2019; lumbar spine MRI 02/23/2019 FINDINGS: Lower chest: Normal heart size. Dependent atelectasis within the bilateral lower lobes. Hepatobiliary: Liver is normal in  size and contour. No focal lesion identified. Small amount of sludge in the gallbladder lumen. Pancreas:  Mildly atrophic Spleen: Stable subcentimeter too small to characterize low-attenuation lesions within the spleen. Adrenals/Urinary Tract: Normal adrenal glands. Kidneys enhance symmetrically with contrast. Similar-appearing low-attenuation bilateral renal lesions. Interval development of moderate bilateral hydronephrosis. Urinary bladder is distended. Stomach/Bowel: No abnormal bowel wall thickening or evidence for bowel obstruction. No free fluid or free intraperitoneal air. Normal appendix. Normal morphology of the stomach. Vascular/Lymphatic: Normal caliber abdominal aorta. Peripheral calcified atherosclerotic plaque. No retroperitoneal lymphadenopathy. Reproductive: Heterogeneous prostate. Other: Small fat containing left inguinal hernia. Musculoskeletal: When compared to prior exam there has been interval loss of the inferior endplate of the L3 vertebral body and superior endplate of the L4 vertebral body with associated loss of disc space height at this level. There is soft tissue stranding about this level (image 91; series 7). Redemonstrated fracture of the superior endplate of the L1 vertebral body. IMPRESSION: 1. Interval loss of the inferior endplate of L3 and superior endplate of L4 with associated soft tissue stranding and loss of disc space at this level. Patient has history of prior inferior L3 endplate fracture [id number]. While this may represent interval changes from prior trauma, overall findings are concerning for the possibility of discitis osteomyelitis at this level. This needs dedicated evaluation with lumbar spine MRI. 2. Interval development of bilateral moderate hydronephrosis. This is indeterminate in etiology however likely secondary to associated urinary bladder distension. Consider urinary bladder decompression and follow-up renal ultrasound. Electronically Signed   By: Lovey Newcomer M.D.   On: 04/30/2019 10:49    Procedures Procedures (including critical care time)  Medications  Ordered in ED Medications  ondansetron (ZOFRAN) injection 4 mg (4 mg Intravenous Not Given 04/30/19 0724)  iohexol (OMNIPAQUE) 300 MG/ML solution 100 mL (100 mLs Intravenous Contrast Given 04/30/19 0942)     Initial Impression / Assessment and Plan / ED Course  I have reviewed the triage vital signs and the nursing notes.  Pertinent labs & imaging results that were available during my care of the patient were reviewed by me and considered in my medical decision making (see chart for details).  Clinical Course as of Apr 29 1141  Sun Apr 30, 2019  0944 Spoke with Dr. Sharlot Gowda who will consult on the patient    [AH]    Clinical Course User Index [AH] Margarita Mail, PA-C      .CC:  Vomiting and AMS VS:  Vitals:   04/30/19 0628 04/30/19 0630 04/30/19 0645 04/30/19 1030  BP: 135/65 (!) 141/66 (!) 149/65 140/83  Pulse: 60 (!) 59 (!) 57 61  Resp: 18  18 14   Temp: 97.7 F (36.5 C)     TempSrc: Oral     SpO2:  98% 98% 99%  Weight:      Height:        [id number] is gathered by Patient, EMR and Daughter by phone. DDX:The differential diagnosis for AMS is extensive requiring complex medical decision making and includes, but is not limited to: drug overdose - opioids, alcohol, sedatives, antipsychotics, drug withdrawal, others; Metabolic: hypoxia, hypoglycemia, hyperglycemia, hypercalcemia, hypernatremia, hyponatremia, uremia, hepatic encephalopathy, hypothyroidism, hyperthyroidism, vitamin B12 or thiamine deficiency, carbon monoxide poisoning, Wilson's disease, Lactic acidosis, DKA/HHOS; Infectious: meningitis, encephalitis, bacteremia/sepsis, urinary tract infection, pneumonia, neurosyphilis; Structural: Space-occupying lesion, (brain tumor, subdural hematoma, hydrocephalus,); Vascular: stroke, subarachnoid hemorrhage, coronary ischemia, hypertensive encephalopathy, CNS vasculitis, thrombotic thrombocytopenic purpura, disseminated intravascular coagulation, hyperviscosity; Psychiatric:  Schizophrenia, depression; Other: Seizure, hypothermia,  heat stroke, ICU psychosis, dementia -"sundowning." The emergent differential diagnosis for vomiting includes, but is not limited to ACS/MI, Boerhaave's, DKA, Intracranial Hemorrhage, Ischemic bowel, Meningitis, Sepsis, Acute radiation syndrome, Acute gastric dilation, Acetaminophen toxicity, Adrenal insufficiency, Appendicitis, Aspirin toxicity, Bowel obstruction/ileus, Carbon monoxide poisoning, Cholecystitis, CNS tumor. Digoxin toxicity, Electrolyte abnormalities, Elevated ICP, Gastric outlet obstruction, Hyperemesis gravidarum, Pancreatitis, Peritonitis, Ruptured viscus, Testicular torsion/ovarian torsion, Theophyline toxicity, Biliary colic, Cannabinoid hyperemesis syndrome, Chemotherapy, Disulfiram effect, Erythromycin, ETOH, Gastritis, Gastroenteritis, Gastroparesis, Hepatitis, Ibuprofen, Ipecac toxicity, Labyrinthitis, Migraine, Motion sickness, Narcotic withdrawal, Thyroid, Pregnancy, Peptic ulcer disease, Renal colic, and UTI Labs: I reviewed the labs which show mild hypoglycemia corrected with juice po, Urine appears infected, CBC shows leukocytosis, lipase and UDS wnl. HDS throughout. Imaging: I personally reviewed the images (cxr, CT head, CT ab/pelvis) which show(s) CXR without evidence of Infection or edema, CT head without fracture or acute intracranial pathology, CT abd/pelvis is concerning for Acute discitis/osteomyelitis by my interpretation and as discussed in consult call with Dr. Dian Situ of [facility] radiology.  EKG: EKG shows nsr with a rate of 60 and no signs of acute ischemia on my interpretation MDM: Patient with multiple issues, chiefly  Delirium, urinary retention, dark emesis, and now concern for acute osteomyelitis/discitis of the lumbar spine. The patient's recurrent UTIs are likely coming from his retention issues. I doubt cuada equina given his normal rectal tone an feel that his retention s likely due to his stricture.  Patient was seen by urology and refused an interventions. I have discussed the case with Internal medicine service,  They have consulted with IR about the osteo and have asked that we do not start ABX until bone biopsy has been performed The patient has had no episodes of vomitus here in the ER and currently his hgb is wnl and he is hds , I have low suspicion for clinically sig gi bleed. The patient will be admitted for further work up and stabilization. Patient disposition:ADMIT Patient condition: STABLE. The patient appears reasonably stabilized for admission considering the current resources, flow, and capabilities available in the ED at this time, and I doubt any other [facility] requiring further screening and/or treatment in the ED prior to admission.    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

## 2019-04-30 NOTE — ED Notes (Signed)
Pt had small bowel movement. Pt cleaned and changed into brief.

## 2019-04-30 NOTE — ED Notes (Signed)
Bladder scan volume is 377ml

## 2019-04-30 NOTE — ED Notes (Signed)
Patient transported to MRI 

## 2019-04-30 NOTE — Progress Notes (Signed)
NEW ADMISSION NOTE New Admission Note:   Arrival Method: via bed from ED Mental Orientation: alert and oriented x4 Telemetry: box 3, CCMD notified. Assessment: Completed Skin: warm and dry. Sacral foam for prophylaxis  IV: L AC NSL. Pain: 0-10 Safety Measures: Safety Fall Prevention Plan has been given, discussed and signed Admission: Completed [facility] [place] Orientation: Patient has been orientated to the room, unit and staff.  Family: none at bedside  Orders have been reviewed and implemented. Will continue to monitor the patient. Call light has been placed within reach and bed alarm has been activated.   Orville Govern, RN

## 2019-04-30 NOTE — ED Triage Notes (Signed)
Pt came in [facility] c/o of abnormal stools for the last couple days and vomited after dinner. Daughter said he has has some hallucinations the past couple days. Pt is A/Ox4. Hx. Stroke, Sepsis, UTI, HTN.  97.5, 130/78, 60HR, 98%RA, 20GIV LAC

## 2019-04-30 NOTE — ED Notes (Signed)
Attempted foley catheter placement per PA, unsuccessful attempt

## 2019-04-30 NOTE — ED Triage Notes (Signed)
PT was informed by this Probation officer his Daughter called .

## 2019-04-30 NOTE — ED Provider Notes (Addendum)
Medical screening examination/treatment/procedure(s) were conducted as a shared visit with non-physician practitioner(s) and myself.  I personally evaluated the patient during the encounter.  EKG Interpretation  Date/Time:  Sunday April 30 2019 06:30:45 EST Ventricular Rate:  60 PR Interval:    QRS Duration: 136 QT Interval:  456 QTC Calculation: 456 R Axis:   -71 Text Interpretation: Sinus rhythm RBBB and LAFB No significant change since last tracing Confirmed by Pryor Curia [phone]) on 04/30/2019 6:32:57 AM   Patient is a 78 year old male who presents to the emergency department nausea and vomiting after eating fish last night.  No chest pain, abdominal pain, fever, diarrhea.  States he had a bowel movement last night that was "dry" in appearance.  Abdomen is nontender to palpation.  Does seem to be distended.  Bedside ultrasound reveals distended bladder.  Will need foley catheter.  Will obtain labs, urine, CTAP.    Mildly hypoglycemic here which has improved after p.o. intake.  Is an IDDM.  Hallucinating at home per daughter.  A&O x 3 here in ED.  No HA or h/o head injury.  Wearing TLSO brace for recent compression fractures.  Chronic numbness due to neuropathy in both extremities but no focal weakness.  Chronic urinary incontinence.  Feet cool and dark in color bilaterally with weak dopplerable biphasic pulses.   Work up to be completed by PA and oncoming ED team.   Ward, Delice Bison, DO 04/30/19 0801    Ward, Delice Bison, DO 04/30/19 1439

## 2019-04-30 NOTE — ED Notes (Signed)
Urology cart at bedside 

## 2019-04-30 NOTE — ED Triage Notes (Signed)
PT given OJ for low BS

## 2019-04-30 NOTE — Consult Note (Signed)
Saw the patient given elevated PVR, bilateral hydronephrosis, inability to pass foley prior given recurrent BXO/meatal stricture with recurrent UTIs.   Patient refused attempt at urethral catheterization (even with pediatric sized catheters), refused SPT. Patient is aware of his bilateral hydronephrosis, meatal stenosis, urethral stricture disease, and possible further bladder dysfunction in the setting of recent back/spine injury.   He declined urologic treatment at this time.  He can follow up on an outpatient basis to discuss further options and interventions.

## 2019-04-30 NOTE — H&P (Addendum)
Date: 04/30/2019               Patient Name:  [name] MRN: [id number]  DOB: [birth year] Age / Sex: 78 y.o., male   PCP: Alroy Dust, L.Marlou Sa, MD         Medical Service: Internal Medicine Teaching Service         Attending Physician: Dr. Evette Doffing, Mallie Mussel, *    First Contact: Marva Panda, MD, Loralyn [place] Pager: [place] [phone])  Second Contact: Eileen Stanford, MD, [place] Pager: [phone])       After Hours (After 5p/  First Contact Pager: [phone]  weekends / holidays): Second Contact Pager: [phone]   Chief Complaint: hallucinations  History of Present Illness: 78 y.o. 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 History obtained by patient and daughter. Per daughter, he has had intermittent hallucinations for one week duration. Earlier in the week, he thought he saw a cloud of smoke across the door and required redirection. However, his hallucinations have increased in frequency over the past two days. He believed he had cookies in his bed and was finding a handful of safety pins when grabbing the sheets. His wife passed away three years ago and lately, he has felt her patting him on the head but when he looks up, she is not there. He was started on wellbutrin and zoloft approximately 4-5 weeks ago for agitation which has improved, but he has had hallucinations since then. He denies any fevers, chills, lightheadedness/dizziness, auditory hallucinations, current nausea/vomiting, abdominal pain, diarrhea, dysuria or change in odor of urine, increased numbness/tingling in hands/feet.    Patient has history of urinary retention with overflow incontinence with recurrent UTIs. He wears a diaper due to the incontinence. His prior admission was for a UTI as  well and he was discharged with levaquin to complete at SNF. He has completed his course of antibiotics. However, the daughter notes that his urine is very dark and about the same color as his stools. She denies any change in the odor of the urine.   Of note, patient has chronic constipation for which he has tried taking prune juice and Miralax. Per daughter, he last had a complete bowel movement on Monday of last week. He complained of nausea yesterday morning. Due to his history of constipation, the daughter gave him some prune juice and Miralax. He had a small bowel movement but continued to experience nausea. He was able to tolerate lunch but after supper, he started getting sick to his stomach. As the night progressed, he started vomiting. His emesis was reddish-brown to dark brown in color. Patient reports it looked like the fish sticks and cole slaw with prune juice that he had for supper. The daughter contacted nursing from [facility] who advised her to check his blood sugars. She reported that it was 64, which is low for him. He drank some orange juice which he tolerated well and his daughter brought him to the hospital for further evaluation.    ED course:  Patient presented to the ED with the symptoms as above. He has a neutrophil predominant leukocytosis. UA with large leukocytes but rare bacteria. CT Head negative for acute intracranial abnormality. CT abdomen concerning for possible discitis osteomyelitis of prior L3-L4 fracture. Also has moderate hydronephrosis likely secondary to urinary retention. Urology  consulted and patient admitted to internal medicine for further evaluation and management.   Meds:  Current Meds  Medication Sig   acetaminophen (TYLENOL) 500 MG tablet Take 500 mg by mouth every 6 (six) hours as needed for mild pain.   amLODipine (NORVASC) 5 MG tablet Take 1 tablet (5 mg total) by mouth 2 (two) times daily. (Patient taking differently: Take 5 mg by mouth daily. )    aspirin EC 81 MG EC tablet Take 1 tablet (81 mg total) by mouth daily. (Patient taking differently: Take 81 mg by mouth every evening. )   bisacodyl (DULCOLAX) 5 MG EC tablet Take 2 tablets (10 mg total) by mouth daily as needed for moderate constipation.   buPROPion (WELLBUTRIN XL) 150 MG 24 hr tablet Take 150 mg by mouth at bedtime.   clopidogrel (PLAVIX) 75 MG tablet Take 75 mg by mouth daily.   HYDROcodone-acetaminophen (NORCO/VICODIN) 5-325 MG tablet Take 1 tablet by mouth every 4 (four) hours as needed for severe pain.   insulin degludec (TRESIBA FLEXTOUCH) 100 UNIT/ML SOPN FlexTouch Pen Inject 16 Units into the skin daily.   lisinopril (ZESTRIL) 2.5 MG tablet Take 1 tablet (2.5 mg total) by mouth daily.   metoprolol tartrate (LOPRESSOR) 25 MG tablet Take 0.5 tablets (12.5 mg total) by mouth 2 (two) times daily.   sertraline (ZOLOFT) 50 MG tablet Take 50 mg by mouth at bedtime.   simvastatin (ZOCOR) 20 MG tablet Take 1 tablet (20 mg total) by mouth at bedtime.   Allergies: Allergies as of 04/30/2019 - Review Complete 04/30/2019  Allergen Reaction Noted   Prevnar [pneumococcal 13-val conj vacc] Anaphylaxis 06/28/2017   Actos [pioglitazone hydrochloride] Other (See Comments) 06/10/2011   Bupropion Other (See Comments) 04/30/2019   Hydrochlorothiazide Other (See Comments) 06/18/2011   Sertraline Other (See Comments) 04/30/2019   Penicillins Rash 08/25/2010   Past Medical History:  Past Medical History:  Diagnosis Date   A-fib ([place])    B-cell lymphoma ([place]) 10/09/2011   B-cell lymphoma ([place])    Cataract    Charcot-Marie-Tooth disease    Diabetes mellitus    Hypertension    nhl dx'd 11/22/2009   diffuse large b cell; chemo comp 02/2010; xrt comp 03/2010   SIRS (systemic inflammatory response syndrome) ([place]) 09/2015   Stroke ([place])    Weakness    Surgical History:  Past Surgical History:  Procedure Laterality Date   ANKLE FUSION Left    CATARACT  EXTRACTION     CIRCUMCISION     TOE AMPUTATION     right foot third toe    URETHRA SURGERY     Family History:  Family History  Problem Relation Age of Onset   Hypertension Mother    Hypertension Father      Social History:  Social History   Tobacco Use   Smoking status: Former Smoker    Packs/day: 2.00    Years: 35.00    Pack years: 70.00    Quit date: 06/03/1991    Years since quitting: 27.9   Smokeless tobacco: Former Systems developer    Quit date: 06/26/1990  Substance Use Topics   Alcohol use: No   Drug use: No   Patient is mostly bed bound for the last few years and is dependent on others for his ADL's. He has been living with his daughter. After his last hospitalization, he was discharged to SNF but was only there for 2 weeks due to insurance issues. Since then, he has been living with his  daughter and has had physical therapy coming to the home. He reports smoking 3-3.5 packs per day of cigarettes for 35 years. He denies any alcohol or illicit drug use. He does not take any over the counter medications.   Review of Systems: A complete ROS was negative except as per HPI.   Physical Exam: Blood pressure (!) 143/69, pulse 62, temperature 97.7 F (36.5 C), temperature source Oral, resp. rate 20, height 5\' 9"  (1.753 m), weight 77.1 kg, SpO2 99 %. Physical Exam Vitals signs reviewed.  Constitutional:      General: He is not in acute distress.    Appearance: Normal appearance. He is not ill-appearing or diaphoretic.  HENT:     Head: Normocephalic and atraumatic.     Mouth/Throat:     Mouth: Mucous membranes are moist.     Pharynx: Oropharynx is clear.  Eyes:     General: No scleral icterus.    Extraocular Movements: Extraocular movements intact.     Conjunctiva/sclera: Conjunctivae normal.  Neck:     Musculoskeletal: Normal range of motion and neck supple.  Cardiovascular:     Rate and Rhythm: Normal rate and regular rhythm.     Heart sounds: Normal heart sounds. No  murmur. No friction rub. No gallop.   Pulmonary:     Effort: Pulmonary effort is normal. No respiratory distress.     Breath sounds: Normal breath sounds. No wheezing or rales.  Abdominal:     General: Bowel sounds are normal. There is distension.     Tenderness: There is no abdominal tenderness. There is no guarding or rebound.  Musculoskeletal: Normal range of motion.  Skin:    General: Skin is warm and dry.     Capillary Refill: Capillary refill takes less than 2 seconds.  Neurological:     Mental Status: He is alert and oriented to person, place, and time. Mental status is at baseline.    CBC Latest Ref Rng & Units 04/30/2019 03/23/2019 03/22/2019  WBC 4.0 - 10.5 K/uL 16.8(H) 12.9(H) 15.0(H)  Hemoglobin 13.0 - 17.0 g/dL 13.9 11.3(L) 11.7(L)  Hematocrit 39.0 - 52.0 % 42.2 35.2(L) 36.4(L)  Platelets 150 - 400 K/uL 292 232 226    BMP Latest Ref Rng & Units 04/30/2019 03/23/2019 03/22/2019  Glucose 70 - 99 mg/dL 63(L) 239(H) 199(H)  BUN 8 - 23 mg/dL 17 15 22   Creatinine 0.61 - 1.24 mg/dL 0.89 0.68 0.82  Sodium 135 - 145 mmol/L 139 134(L) 136  Potassium 3.5 - 5.1 mmol/L 4.1 3.7 4.2  Chloride 98 - 111 mmol/L 103 102 107  CO2 22 - 32 mmol/L 25 25 23   Calcium 8.9 - 10.3 mg/dL 8.3(L) 7.7(L) 7.6(L)   EKG: personally reviewed my interpretation is RBBB and left anterior fascicular block, similar to previous EKG. QTc 456  CXR: personally reviewed my interpretation is no acute cardiopulmonary disease.  CT HEAD WO CONTRAST: IMPRESSION: 1. No evidence of acute intracranial abnormality. 2. Moderate to advanced chronic small vessel ischemic disease has progressed from prior MRI 06/25/2017. Redemonstrated chronic left cerebellar infarct. 3. Moderate generalized parenchymal atrophy.  CT ABDOMEN PELVIS W CONTRAST: IMPRESSION: 1. Interval loss of the inferior endplate of L3 and superior endplate of L4 with associated soft tissue stranding and loss of disc space at this level. Patient has history  of prior inferior L3 endplate fracture [id number]. While this may represent interval changes from prior trauma, overall findings are concerning for the possibility of discitis osteomyelitis at this level. This needs dedicated  evaluation with lumbar spine MRI. 2. Interval development of bilateral moderate hydronephrosis. This is indeterminate in etiology however likely secondary to associated urinary bladder distension. Consider urinary bladder decompression and follow-up renal ultrasound.  MRI LUMBAR SPINE W WO CONTRAST:  IMPRESSION: Transitional lumbosacral anatomy with spinal numbering as described. Findings consistent with L3-L4 and L4-L5 disc discitis/osteomyelitis as detailed. Surrounding paraspinal soft tissue phlegmon extending into the neural foramina at these levels. Epidural phlegmon spanning the L3-L5 levels measuring up to 5 mm in AP dimension. Findings equivocal for discitis/osteomyelitis at the L5-S1 level. Sequela of discitis/osteomyelitis and superimposed spondylosis contribute to severe L3-L4 spinal canal stenosis Lumbar spondylosis at the remaining levels has not significantly changed since MRI 02/23/2019. Chronic L1 superior endplate height loss has slightly progressed.  Assessment & Plan by Problem:  Mr. Weers is a 78 year old male with PMHx of DLBCL in remission, A.fib, HTN, DM, and prior CVA presenting with one week of intermittent hallucinations, chronic constipation with one day of nausea/vomiting, and chronic urinary retention w/overflow incontinence. Labs significant for neutrophil predominant leukocytosis. UA consistent with sterile pyuria. CT Abdomen and MRI lumbar spine concerning for discitis/osteomyelitis at L3-L4 and L4-5 with phlegmon spanning L3-5 and measuring up to 63mm.   L3-5 Discitis vs. Osteomyletis:  Patient had lumbar fracture after a fall found on CT on 9/3 significant for compression fracture of L1 superior endplate and L3 inferior endplate w/o  retropulsion and additional nondisplaced fracture of the lower sacrum. Since then, patient has been in a TLSO brace. He has been mostly bed bound and reports significant discomfort with any movement. He has a history of urinary retention with overflow incontinence that has not significantly worsened.  On examination, he continued to be in the brace with significant discomfort on movement. No symptoms concerning for cauda equina syndrome. Labs consistent with neutrophil predominant leukocytosis which appears to be chronic in nature. CT findings concerning for discitis/osteomyelitis at L5-S1 level. This was confirmed with MRI which also displayed surrounding paraspinal soft tissue phelgmon extending into the nerual foramina spanning L3-L5 measuring up to 78mm.  - IR guided biopsy of paraspinal phlegmon  - Will hold off on antibiotics pending cultures  - f/u CBC  Hallucinations: Patient presenting with one week of intermittent visual and sensory hallucinations. Patient is alert and oriented with capacity during examination. He is aware of the hallucinations and reports that it is because of his medications. He was started on zoloft and wellbutrin about 4-5 weeks ago for agitation which has improved.  - Holding zoloft and wellbutrin - Will continue to monitor   Chronic asymptomatic bacteruria in setting of chronic urinary retention:  Patient has a history of lichen sclerosis with urethral structure and has had several procedures in the past for correction. However, he continues to have urinary retention with overflow incontinence. Patient wears a diaper and has had multiple UTIs in the past and was most recently admitted on 9/28 for a UTI. He has abdominal distension secondary to the urinary retention and was noted by ED provider to have a significantly distended bladder to the navel. Patient was evaluated at bedside by urology and at this time, patient does not want foley catheter or suprapubic catheter after  multiple failed attempts. Based on shared decision making, will hold off on foley catheter insertion or I/O cath by nursing as patient would be unable to tolerate. Will re-consult urology for placement of foley catheter if patient becomes oliguric.  - Continue tamsulosin 0.4mg  qHS - Will avoid foley  catheter insertion - Bladder scan qshift  History of A.fib:  History of A.fib not on anticoagulation as patient has history of bleeding with anticoagulation. He is currently rate controlled with metoprolol.  - Continue metoprolol 12.5mg  bid  - Cardiac monitoring   History of DMII:  Patient with history of DMII on Tresiba 16U qd. However, presented with hypoglycemia of 34.  - Holding insulin  - CBG monitoring   History of CVA: Patient has a history of CVA with some right sided deficits. He is mostly bed bound due to deconditioning.Per daughter, patient had some right arm weakness when he came home from SNF on 10/25. However, he was able to work with physical therapy and reports that his strength is starting to come back. On examination, he did not seem to have any obvious focal deficits. - Continue aspirin 81mg  qd - Continue plavix 75mg  qd - Continue simvastatin 20mg  qHS - Continue to monitor - PT/OT evaluation   Diet: Carb modified  Code: DNR VTE Prophylaxis: SCD's   Dispo: Admit patient to Inpatient with expected length of stay greater than 2 midnights.  Signed: Harvie Heck, MD  Internal Medicine, PGY-1  04/30/2019, 1:46 PM  Pager: [phone]

## 2019-05-01 DIAGNOSIS — I10 Essential (primary) hypertension: Secondary | ICD-10-CM

## 2019-05-01 DIAGNOSIS — N35919 Unspecified urethral stricture, male, unspecified site: Secondary | ICD-10-CM

## 2019-05-01 DIAGNOSIS — R32 Unspecified urinary incontinence: Secondary | ICD-10-CM

## 2019-05-01 DIAGNOSIS — G061 Intraspinal abscess and granuloma: Secondary | ICD-10-CM

## 2019-05-01 DIAGNOSIS — R8281 Pyuria: Secondary | ICD-10-CM

## 2019-05-01 DIAGNOSIS — R339 Retention of urine, unspecified: Secondary | ICD-10-CM

## 2019-05-01 DIAGNOSIS — N1330 Unspecified hydronephrosis: Secondary | ICD-10-CM

## 2019-05-01 DIAGNOSIS — L8990 Pressure ulcer of unspecified site, unspecified stage: Secondary | ICD-10-CM | POA: Diagnosis present

## 2019-05-01 DIAGNOSIS — R8271 Bacteriuria: Secondary | ICD-10-CM

## 2019-05-01 DIAGNOSIS — K5909 Other constipation: Secondary | ICD-10-CM

## 2019-05-01 DIAGNOSIS — M4646 Discitis, unspecified, lumbar region: Secondary | ICD-10-CM

## 2019-05-01 DIAGNOSIS — R443 Hallucinations, unspecified: Secondary | ICD-10-CM

## 2019-05-01 DIAGNOSIS — E440 Moderate protein-calorie malnutrition: Secondary | ICD-10-CM | POA: Insufficient documentation

## 2019-05-01 DIAGNOSIS — M4626 Osteomyelitis of vertebra, lumbar region: Secondary | ICD-10-CM

## 2019-05-01 DIAGNOSIS — Z8673 Personal history of transient ischemic attack (TIA), and cerebral infarction without residual deficits: Secondary | ICD-10-CM

## 2019-05-01 LAB — CBC
HCT: 42.4 % (ref 39.0–52.0)
Hemoglobin: 13.9 g/dL (ref 13.0–17.0)
MCH: 28.8 pg (ref 26.0–34.0)
MCHC: 32.8 g/dL (ref 30.0–36.0)
MCV: 88 fL (ref 80.0–100.0)
Platelets: 266 10*3/uL (ref 150–400)
RBC: 4.82 MIL/uL (ref 4.22–5.81)
RDW: 13.5 % (ref 11.5–15.5)
WBC: 12.8 10*3/uL — ABNORMAL HIGH (ref 4.0–10.5)
nRBC: 0 % (ref 0.0–0.2)

## 2019-05-01 LAB — BASIC METABOLIC PANEL
Anion gap: 9 (ref 5–15)
BUN: 15 mg/dL (ref 8–23)
CO2: 28 mmol/L (ref 22–32)
Calcium: 8.6 mg/dL — ABNORMAL LOW (ref 8.9–10.3)
Chloride: 101 mmol/L (ref 98–111)
Creatinine, Ser: 0.85 mg/dL (ref 0.61–1.24)
GFR calc Af Amer: >60 mL/min (ref >60)
GFR calc non Af Amer: >60 mL/min (ref >60)
Glucose, Bld: 137 mg/dL — ABNORMAL HIGH (ref 70–99)
Potassium: 3.8 mmol/L (ref 3.5–5.1)
Sodium: 138 mmol/L (ref 135–145)

## 2019-05-01 LAB — GLUCOSE, CAPILLARY
Glucose-Capillary: 120 mg/dL — ABNORMAL HIGH (ref 70–99)
Glucose-Capillary: 93 mg/dL (ref 70–99)
Glucose-Capillary: 101 mg/dL — ABNORMAL HIGH (ref 70–99)
Glucose-Capillary: 268 mg/dL — ABNORMAL HIGH (ref 70–99)

## 2019-05-01 MED ORDER — BOOST PLUS PO LIQD
237.0000 mL | Freq: Three times a day (TID) | ORAL | Status: DC
  Administered 2019-05-01 – 2019-05-06 (×12): 237 mL via ORAL
  Filled 2019-05-01 (×17): qty 237

## 2019-05-01 MED ORDER — ADULT MULTIVITAMIN W/MINERALS CH
1.0000 | ORAL_TABLET | Freq: Every day | ORAL | Status: DC
  Administered 2019-05-01 – 2019-05-06 (×6): 1 via ORAL
  Filled 2019-05-01 (×6): qty 1

## 2019-05-01 NOTE — Evaluation (Signed)
Physical Therapy Evaluation Patient Details Name: [name] MRN: [id number] DOB: 1941/01/17 Today's Date: 05/01/2019   History of Present Illness  78 y.o. 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 CT abdomen concerning for possible discitis osteomyelitis of prior L3-L4 fracture.    Clinical Impression  Pt admitted with above diagnosis. PTA pt lived with his daughter. Primarily bedbound, only getting OOB with PT for transfer to wheelchair. Pt fell Aug 2020 and has had multiple hospitalizations and short SNF stay since that time. Prior to Aug 2020 pt was independent living alone. On eval pt required min assist rolling. Pt unwilling to attempt supine to sit due to fear of increased pain. Pt currently with functional limitations due to the deficits listed below (see PT Problem List). Pt will benefit from skilled PT to increase their independence and safety with mobility to allow discharge to the venue listed below.  Family has all needed DME. They were active with [facility] at time of admission and wish to resume services upon d/c. Daughter reports [facility] will be required for transport home.     Follow Up Recommendations Home health PT;Supervision/Assistance - 24 hour    Equipment Recommendations  None recommended by PT    Recommendations for Other Services       Precautions / Restrictions Precautions Precautions: Fall;Back Required Braces or Orthoses: Spinal Brace Spinal Brace: Lumbar corset;Applied in sitting position Restrictions Weight Bearing Restrictions: Yes Other Position/Activity Restrictions: No order in chart for back brace. Above info taken from previous admission and confirmed with pt/daughter.      Mobility  Bed  Mobility Overal bed mobility: Needs Assistance Bed Mobility: Rolling Rolling: Min assist         General bed mobility comments: +rail, pt unwilling to attempt supine to sit due to pain  Transfers Overall transfer level: Needs assistance               General transfer comment: PTA pt only transferred OOB during PT, total assist  Ambulation/Gait             General Gait Details: wheelchair dependent  Stairs            Wheelchair Mobility    Modified Rankin (Stroke Patients Only)       Balance                                             Pertinent Vitals/Pain Pain Assessment: Faces Faces Pain Scale: Hurts little more Pain Location: back with bed mobility Pain Descriptors / Indicators: Guarding;Discomfort Pain Intervention(s): Limited activity within patient's tolerance    Home Living Family/patient expects to be discharged to:: Private residence Living Arrangements: Children Available Help at Discharge: Family;Available 24 hours/day Type of Home: House Home Access: Stairs to enter Entrance Stairs-Rails: Right Entrance Stairs-Number of Steps: 4 Home Layout: One level Home Equipment: Cane - single point;Cane - quad;Walker - 2 wheels;Wheelchair - manual;Bedside commode;Walker - 4 wheels;Other (comment)(adjustable frame bed)      Prior Function Level of Independence: Needs assistance   Gait / Transfers Assistance Needed: Since d/c from hospital, has been staying in the bed. Only gets out of the bed when PT is there.  ADL's / Homemaking Assistance Needed: Requires assist for ADL tasks. Sponge bathes.  Comments: Prior to fall in August 2020, pt lived alone independent.     Hand Dominance   Dominant Hand: Right    Extremity/Trunk Assessment   Upper Extremity Assessment Upper Extremity Assessment: RUE deficits/detail;Generalized weakness RUE Deficits / Details: limited ROM R shld    Lower Extremity Assessment Lower  Extremity Assessment: Generalized weakness;RLE deficits/detail;LLE deficits/detail RLE Sensation: history of peripheral neuropathy LLE Sensation: history of peripheral neuropathy    Cervical / Trunk Assessment Cervical / Trunk Assessment: Other exceptions Cervical / Trunk Exceptions: healing lumbar fxs  Communication   Communication: No difficulties  Cognition Arousal/Alertness: Awake/alert Behavior During Therapy: WFL for tasks assessed/performed Overall Cognitive Status: Within Functional Limits for tasks assessed                                        General Comments      Exercises General Exercises - Lower Extremity Ankle Circles/Pumps: AROM;Both;10 reps;Supine Heel Slides: AROM;5 reps;Right;Left   Assessment/Plan    PT Assessment Patient needs continued PT services  PT Problem List Decreased strength;Decreased mobility;Decreased activity tolerance;Decreased balance;Pain       PT Treatment Interventions Therapeutic activities;DME instruction;Therapeutic exercise;Patient/family education;Balance training;Functional mobility training    PT Goals (Current goals can be found in the Care Plan section)  Acute Rehab PT Goals Patient Stated Goal: decrease pain PT Goal Formulation: With patient/family Time For Goal Achievement: 05/15/19 Potential to Achieve Goals: Fair    Frequency Min 3X/week   Barriers to discharge        Co-evaluation               AM-PAC PT "6 Clicks" Mobility  Outcome Measure Help needed turning from your back to your side while in a flat bed without using bedrails?: A Little Help needed moving from lying on your back to sitting on the side of a flat bed without using bedrails?: A Lot Help needed moving to and from a bed to a chair (including a wheelchair)?: Total Help needed standing up from a chair using your arms (e.g., wheelchair or bedside chair)?: Total Help needed to walk in hospital room?: Total Help needed  climbing 3-5 steps with a railing? : Total 6 Click Score: 9    End of Session   Activity Tolerance: Patient limited by pain Patient left: in bed;with family/visitor present;with call bell/phone within reach Nurse Communication: Mobility status PT Visit Diagnosis: Other abnormalities of gait and mobility (R26.89);Pain    Time: [id number] PT Time Calculation (min) (ACUTE ONLY): 19 min   Charges:   PT Evaluation $PT Eval Moderate Complexity: 1 Mod          Lorrin Goodell, PT  Office # [phone] Pager [phone]   Lorriane Shire 05/01/2019, 1:50 PM

## 2019-05-01 NOTE — Progress Notes (Addendum)
   Subjective: [name] was evaluated at bedside this morning. He is resting comfortably in bed. He reports being hungry and thirsty but otherwise in no acute distress. He endorses urinating and two bowel movements since this morning. He denies any pain. He reports hallucinations are improved and did not have any overnight.   Objective:  Vital signs in last 24 hours: Vitals:   04/30/19 1545 04/30/19 1600 04/30/19 1635 04/30/19 2120  BP: (!) 122/56 129/64 (!) 149/66 (!) 155/72  Pulse: 60 63 63 70  Resp:   18 18  Temp:   97.6 F (36.4 C) 97.7 F (36.5 C)  TempSrc:   Oral Oral  SpO2: 97% 97% 99% 98%  Weight:      Height:       Physical Exam Vitals signs and nursing note reviewed.  Constitutional:      General: He is not in acute distress.    Appearance: Normal appearance. He is not ill-appearing.  Abdominal:     General: Bowel sounds are normal. There is distension.     Tenderness: There is no abdominal tenderness. There is no guarding or rebound.     Comments: Bladder palpated to above navel with mild abdominal distension   Neurological:     Mental Status: He is alert and oriented to person, place, and time. Mental status is at baseline.    Assessment/Plan:  [name] is a 78 year old male with PMHx of DLBCL in remission, A.fib, HTN, DM, and prior CVA presenting with one week of intermittent hallucinations, chronic constipation with one day of nausea/vomiting, and chronic urinary retention w/overflow incontinence. Labs significant for neutrophil predominant leukocytosis. UA consistent with sterile pyuria. CT Abdomen and MRI lumbar spine concerning for discitis/osteomyelitis at L3-L4 and L4-5 with phlegmon spanning L3-5 and measuring up to 14mm.   Discitis vs Osteomyletitis: Patient with history of lumbar fracture on 9/3 in TLSO brace. CT and MRI finding of L3-5 discitis/osteomyelitis with phlegmon spanning L3-5, up to 65mm in size. He still has pain on movement. Leukocytosis  present but improving. Discussed with him that will need to go for image guided aspiration with IR. Patient is in agreement.  - IR guided aspiration of phlegmon tomororw - NPO after midnight  - Continue to hold antibiotics - CBC daily   Hallucinations:  Patient reports these are improved and denies any hallucinations overnight.  - Continue to hold zofran and wellbutrin  Chronic asymptomatic bacteruria:  Patient with chronic urinary retention with overflow incontinence 2/2 urethral stricture. He has bilateral hydronephrosis but refused urethral catheterization and suprapubic catheterization yesterday. He continues to have overflow incontinence today. Renal function is stable today but will continue to monitor for any worsening of renal function or if patient becomes oliguric at which point he will need catheter placement.  - Start antibiotics once culture obtained from the vertebral osteomyelitis   Dispo: Anticipated discharge pending clinical improvement.   Harvie Heck, MD  Internal Medicine, PGY-1 05/01/2019, 6:23 AM Pager: [phone]

## 2019-05-01 NOTE — Plan of Care (Signed)
  Problem: Education: Goal: Knowledge of General Education information will improve Description: Including pain rating scale, medication(s)/side effects and non-pharmacologic comfort measures 05/01/2019 0807 by Dolores Hoose, RN Outcome: Progressing 05/01/2019 0806 by Dolores Hoose, RN Outcome: Progressing   Problem: Clinical Measurements: Goal: Ability to maintain clinical measurements within normal limits will improve Outcome: Progressing Goal: Diagnostic test results will improve Outcome: Progressing   Problem: Elimination: Goal: Will not experience complications related to bowel motility 05/01/2019 0807 by Dolores Hoose, RN Outcome: Progressing 05/01/2019 0806 by Dolores Hoose, RN Outcome: Progressing

## 2019-05-01 NOTE — Progress Notes (Addendum)
PT Cancellation Note  Patient Details Name: KEIDAN WASSOM MRN: [id number] DOB: 02/22/1941   Cancelled Treatment:    Reason Eval/Treat Not Completed: Medical issues which prohibited therapy Pt declining participation in PT eval due to NPO awaiting procedure (lumbar disc aspiration). PT to re-attempt as time allows following procedure.   Lorriane Shire 05/01/2019, 10:46 AM  Lorrin Goodell, PT  Office # [phone] Pager [phone]

## 2019-05-01 NOTE — Progress Notes (Signed)
Chief Complaint: Patient was seen in consultation today for lumbar disc aspiration at the request of Dr. Lalla Brothers  Referring Physician(s): Dr. Lalla Brothers  Supervising Physician: Arne Cleveland  Patient Status: [facility] - In-pt  History of Present Illness: [name] is a 78 y.o. male with multiple medical issues including back pain with radiculopathy that has been conservatively treated for several weeks. Workup finds evidence of L3-L4 and L4-L5 discitis/osteo with paraspinal soft tissue phlegmon extension. IR is asked to perform phlegmon/disc aspiration for sampling/cultures. PMHx, meds, labs, imaging, allergies reviewed. On chronic Plavix and ASA for previous CVA history. Feels some better since admission.    Past Medical History:  Diagnosis Date   A-fib ([place])    B-cell lymphoma ([place]) 10/09/2011   B-cell lymphoma ([place])    Cataract    Charcot-Marie-Tooth disease    Diabetes mellitus    Hypertension    nhl dx'd 11/22/2009   diffuse large b cell; chemo comp 02/2010; xrt comp 03/2010   SIRS (systemic inflammatory response syndrome) ([place]) 09/2015   Stroke ([place])    Weakness     Past Surgical History:  Procedure Laterality Date   ANKLE FUSION Left    CATARACT EXTRACTION     CIRCUMCISION     TOE AMPUTATION     right foot third toe    URETHRA SURGERY      Allergies: Prevnar [pneumococcal 13-val conj vacc], Actos [pioglitazone hydrochloride], Bupropion, Hydrochlorothiazide, Sertraline, and Penicillins  Medications:  Current Facility-Administered Medications:    acetaminophen (TYLENOL) tablet 500 mg, 500 mg, Oral, Q6H PRN, Agyei, Obed K, MD   amLODipine (NORVASC) tablet 5 mg, 5 mg, Oral, BID, Agyei, Obed K, MD, 5 mg at 05/01/19 1014   aspirin EC tablet 81 mg, 81 mg, Oral, Daily, Agyei, Obed K, MD, 81 mg at 05/01/19 1013   clopidogrel (PLAVIX) tablet 75 mg, 75 mg, Oral, Daily, Agyei, Obed K, MD, 75 mg at 05/01/19 1014   metoprolol  tartrate (LOPRESSOR) tablet 12.5 mg, 12.5 mg, Oral, BID, Agyei, Obed K, MD, 12.5 mg at 05/01/19 1014   ondansetron (ZOFRAN) injection 4 mg, 4 mg, Intravenous, Once, Ward, Kristen N, DO   polyethylene glycol (MIRALAX / GLYCOLAX) packet 17 g, 17 g, Oral, Daily, Agyei, Obed K, MD   senna (SENOKOT) tablet 8.6 mg, 1 tablet, Oral, Daily, Agyei, Obed K, MD, 8.6 mg at 04/30/19 2121   simvastatin (ZOCOR) tablet 20 mg, 20 mg, Oral, QHS, Agyei, Obed K, MD, 20 mg at 04/30/19 2120   tamsulosin (FLOMAX) capsule 0.4 mg, 0.4 mg, Oral, QPC supper, Agyei, Caprice Kluver, MD    Family History  Problem Relation Age of Onset   Hypertension Mother    Hypertension Father     Social History   Socioeconomic History   Marital status: Widowed    Spouse name: Not on file   Number of children: Not on file   Years of education: Not on file   Highest education level: Not on file  Occupational History   Not on file  Social Needs   Financial resource strain: Not on file   Food insecurity    Worry: Not on file    Inability: Not on file   Transportation needs    Medical: Not on file    Non-medical: Not on file  Tobacco Use   Smoking status: Former Smoker    Packs/day: 2.00    Years: 35.00    Pack years: 70.00    Quit date: 06/03/1991  Years since quitting: 27.9   Smokeless tobacco: Former Systems developer    Quit date: 06/26/1990  Substance and Sexual Activity   Alcohol use: No   Drug use: No   Sexual activity: Not on file  Lifestyle   Physical activity    Days per week: Not on file    Minutes per session: Not on file   Stress: Not on file  Relationships   Social connections    Talks on phone: Not on file    Gets together: Not on file    Attends religious service: Not on file    Active member of club or organization: Not on file    Attends meetings of clubs or organizations: Not on file    Relationship status: Not on file  Other Topics Concern   Not on file  Social History Narrative    Not on file     Review of Systems: A 12 point ROS discussed and pertinent positives are indicated in the HPI above.  All other systems are negative.  Review of Systems  Vital Signs: BP (!) 147/76 (BP Location: Right Arm)    Pulse 81    Temp 97.9 F (36.6 C) (Oral)    Resp 18    Ht 5\' 9"  (1.753 m)    Wt 77.1 kg    SpO2 98%    BMI 25.10 kg/m   Physical Exam Constitutional:      General: He is not in acute distress.    Appearance: Normal appearance. He is not ill-appearing.  HENT:     Mouth/Throat:     Mouth: Mucous membranes are moist.     Pharynx: Oropharynx is clear.  Cardiovascular:     Rate and Rhythm: Normal rate and regular rhythm.     Heart sounds: Normal heart sounds.  Pulmonary:     Effort: Pulmonary effort is normal. No respiratory distress.     Breath sounds: Normal breath sounds.  Skin:    General: Skin is warm and dry.  Neurological:     General: No focal deficit present.     Mental Status: He is alert and oriented to person, place, and time.       Imaging: Dg Chest 1 View  Result Date: 04/30/2019 CLINICAL DATA:  Chest pain and shortness of breath EXAM: CHEST  1 VIEW COMPARISON:  None. FINDINGS: Monitoring leads overlie the patient. Normal cardiac and mediastinal contours. Elevation right hemidiaphragm. No consolidative pulmonary opacities. No pleural effusion or pneumothorax. IMPRESSION: Low lung volumes.  No acute cardiopulmonary process. Electronically Signed   By: Lovey Newcomer M.D.   On: 04/30/2019 10:18   Ct Head Wo Contrast  Result Date: 04/30/2019 CLINICAL DATA:  Altered level of consciousness (LOC), unexplained. EXAM: CT HEAD WITHOUT CONTRAST TECHNIQUE: Contiguous axial images were obtained from the base of the skull through the vertex without intravenous contrast. COMPARISON:  Brain MRI 06/25/2017, head CT 09/23/2015 FINDINGS: Brain: No evidence of acute intracranial hemorrhage. No demarcated cortical infarction. No evidence of intracranial mass. No  midline shift or extra-axial fluid collection. Moderate/advanced chronic small vessel ischemic disease has progressed since MRI 06/25/2017, most notably along the lateral aspect of the left frontal horn and left caudate head. Redemonstrated chronic infarct within the left cerebellum. Moderate generalized parenchymal atrophy Vascular: No hyperdense vessel.  Atherosclerotic calcifications. Skull: No calvarial fracture or suspicious osseous lesion. Sinuses/Orbits: Visualized orbits demonstrate no acute abnormality. Trace fluid within a posterior right ethmoid air cell. No significant mastoid effusion. IMPRESSION: 1. No evidence  of acute intracranial abnormality. 2. Moderate to advanced chronic small vessel ischemic disease has progressed from prior MRI 06/25/2017. Redemonstrated chronic left cerebellar infarct. 3. Moderate generalized parenchymal atrophy. Electronically Signed   By: Kellie Simmering DO   On: 04/30/2019 09:21   Mr Lumbar Spine W Wo Contrast  Result Date: 04/30/2019 CLINICAL DATA:  Radiculopathy, greater than 6 weeks conservative treatment, persistent symptoms concern for discitis. EXAM: MRI LUMBAR SPINE WITHOUT AND WITH CONTRAST TECHNIQUE: Multiplanar and multiecho pulse sequences of the lumbar spine were obtained without and with intravenous contrast. CONTRAST:  41mL GADAVIST GADOBUTROL 1 MMOL/ML IV SOLN COMPARISON:  CT abdomen/pelvis 04/30/2019, lumbar spine MRI 04/30/2019 FINDINGS: Segmentation: Transitional lumbosacral anatomy is again identified. For the purposes of this examination spinal numbering will remain consistent with prior MRI 02/23/2019 with fully lumbarized S1 segment. Alignment: Straightening of the expected lumbar lordosis. No significant spondylolisthesis. Vertebrae: Redemonstrated mild now chronic L1 superior endplate compression deformity with height loss minimally progressed as compared to prior MRI. Mild edema at this site which is likely posttraumatic and/or degenerative. There  has been significant interval progression of inferior height loss of the L3 vertebral body, now approximately 30%. New from prior exam there is marked L3 and L4 endplate irregularity with prominent enhancement within both vertebral bodies. There is also abnormal T2 hyperintense fluid within the L3-L4 disc space. Findings are consistent with discitis/osteomyelitis. There is pre-existing endplate irregularity at the L4-L5 level, although with new enhancement along the L5 superior endplate suspicious for discitis/osteomyelitis at this level as well. There is enhancing soft tissue surrounding the L3-L4 and L4-L5 disc spaces consistent with phlegmonous change, extending to involve the bilateral psoas muscles and bilateral neural foramina at these levels. Ventral epidural enhancement measuring 5 mm in AP dimension at the L4 level as well as thin ventral epidural enhancement at the L3 and L5 levels consistent with epidural phlegmon. Pre-existing endplate irregularity at L5-S1 with enhancement which is equivocal for discitis/osteomyelitis. Conus medullaris and cauda equina: Conus extends to the L1 level. No signal abnormality within the visualized distal spinal cord. Paraspinal and other soft tissues: Distended urinary bladder. Atrophy of the lumbar paraspinal musculature. Nonspecific edema signal within the dorsal subcutaneous fat. Disc levels: At L3-L4, endplate remodeling with disc bulge, facet arthrosis/ligamentum flavum hypertrophy and epidural phlegmon contribute to severe spinal canal stenosis. At L4-L5, disc bulge with endplate spurring and superimposed left foraminal disc protrusion. Facet arthrosis/ligamentum flavum hypertrophy. Left subarticular stenosis with encroachment upon the descending left L5 nerve root. Mild central canal stenosis. Moderate left neural foraminal narrowing. Findings unchanged At L5-S1, disc bulge with endplate spurring. Facet arthrosis/ligamentum flavum hypertrophy. No significant central  canal stenosis. Bilateral subarticular stenosis with crowding of the bilateral descending S1 nerve roots. Mild bilateral neural foraminal narrowing. Findings unchanged. These results were called by telephone at the time of interpretation on 04/30/2019 at 1:44 pm to provider PA Kenton Kingfisher, who verbally acknowledged these results. IMPRESSION: Transitional lumbosacral anatomy with spinal numbering as described. Findings consistent with L3-L4 and L4-L5 disc discitis/osteomyelitis as detailed. Surrounding paraspinal soft tissue phlegmon extending into the neural foramina at these levels. Epidural phlegmon spanning the L3-L5 levels measuring up to 5 mm in AP dimension. Findings equivocal for discitis/osteomyelitis at the L5-S1 level. Sequela of discitis/osteomyelitis and superimposed spondylosis contribute to severe L3-L4 spinal canal stenosis Lumbar spondylosis at the remaining levels has not significantly changed since MRI 02/23/2019. Chronic L1 superior endplate height loss has slightly progressed. Electronically Signed   By: Kellie Simmering DO  On: 04/30/2019 13:46   Ct Abdomen Pelvis W Contrast  Addendum Date: 04/30/2019   ADDENDUM REPORT: 04/30/2019 11:15 ADDENDUM: These results were called by telephone at the time of interpretation on 04/30/2019 at 11:15 am to provider ABIGAIL HARRIS , who verbally acknowledged these results. Electronically Signed   By: Lovey Newcomer M.D.   On: 04/30/2019 11:15   Result Date: 04/30/2019 CLINICAL DATA:  Abdominal distension. Nausea and vomiting. EXAM: CT ABDOMEN AND PELVIS WITH CONTRAST TECHNIQUE: Multidetector CT imaging of the abdomen and pelvis was performed using the standard protocol following bolus administration of intravenous contrast. CONTRAST:  125mL OMNIPAQUE IOHEXOL 300 MG/ML  SOLN COMPARISON:  CT abdomen pelvis 02/23/2019; lumbar spine MRI 02/23/2019 FINDINGS: Lower chest: Normal heart size. Dependent atelectasis within the bilateral lower lobes. Hepatobiliary: Liver is  normal in size and contour. No focal lesion identified. Small amount of sludge in the gallbladder lumen. Pancreas: Mildly atrophic Spleen: Stable subcentimeter too small to characterize low-attenuation lesions within the spleen. Adrenals/Urinary Tract: Normal adrenal glands. Kidneys enhance symmetrically with contrast. Similar-appearing low-attenuation bilateral renal lesions. Interval development of moderate bilateral hydronephrosis. Urinary bladder is distended. Stomach/Bowel: No abnormal bowel wall thickening or evidence for bowel obstruction. No free fluid or free intraperitoneal air. Normal appendix. Normal morphology of the stomach. Vascular/Lymphatic: Normal caliber abdominal aorta. Peripheral calcified atherosclerotic plaque. No retroperitoneal lymphadenopathy. Reproductive: Heterogeneous prostate. Other: Small fat containing left inguinal hernia. Musculoskeletal: When compared to prior exam there has been interval loss of the inferior endplate of the L3 vertebral body and superior endplate of the L4 vertebral body with associated loss of disc space height at this level. There is soft tissue stranding about this level (image 91; series 7). Redemonstrated fracture of the superior endplate of the L1 vertebral body. IMPRESSION: 1. Interval loss of the inferior endplate of L3 and superior endplate of L4 with associated soft tissue stranding and loss of disc space at this level. Patient has history of prior inferior L3 endplate fracture [id number]. While this may represent interval changes from prior trauma, overall findings are concerning for the possibility of discitis osteomyelitis at this level. This needs dedicated evaluation with lumbar spine MRI. 2. Interval development of bilateral moderate hydronephrosis. This is indeterminate in etiology however likely secondary to associated urinary bladder distension. Consider urinary bladder decompression and follow-up renal ultrasound. Electronically Signed: By:  Lovey Newcomer M.D. On: 04/30/2019 10:49    Labs:  CBC: Recent Labs    03/22/19 0235 03/23/19 0831 04/30/19 0645 05/01/19 0520  WBC 15.0* 12.9* 16.8* 12.8*  HGB 11.7* 11.3* 13.9 13.9  HCT 36.4* 35.2* 42.2 42.4  PLT 226 232 292 266    COAGS: Recent Labs    03/20/19 2304  INR 1.2  APTT 33    BMP: Recent Labs    03/22/19 0235 03/23/19 0831 04/30/19 0645 05/01/19 0520  NA 136 134* 139 138  K 4.2 3.7 4.1 3.8  CL 107 102 103 101  CO2 23 25 25 28   GLUCOSE 199* 239* 63* 137*  BUN 22 15 17 15   CALCIUM 7.6* 7.7* 8.3* 8.6*  CREATININE 0.82 0.68 0.89 0.85  GFRNONAA >60 >60 >60 >60  GFRAA >60 >60 >60 >60    LIVER FUNCTION TESTS: Recent Labs    02/24/19 0410 03/20/19 2304 03/21/19 0750 04/30/19 0645  BILITOT 0.7 0.5 0.8 0.6  AST 26 22 17 28   ALT 19 20 17  36  ALKPHOS 79 89 77 90  PROT 5.8* 6.4* 5.6* 6.2*  ALBUMIN 2.3* 2.5*  2.2* 2.4*    TUMOR MARKERS: No results for input(s): AFPTM, CEA, CA199, CHROMGRNA in the last 8760 hours.  Assessment and Plan: L3-L4 and L4-L5 discitis/osteo with paraspinal soft tissue phlegmon extension. Imaging reviewed with Dr. Vernard Gambles. Plan for image guided aspiration at this level tomorrow. Timing on schedule unknown. NPO p MN. Cannot hold Plavix the normally recommended 5 days prior to this procedure, so elevated risks of bleeding thoroughly reviewed with pt. Risks and benefits discussed with the patient including bleeding, infection, damage to adjacent structures, and sepsis.  All of the patient's questions were answered, patient is agreeable to proceed. Consent signed and in chart.    Thank you for this interesting consult.  I greatly enjoyed meeting [name] and look forward to participating in their care.  A copy of this report was sent to the requesting provider on this date.  Electronically Signed: Ascencion Dike, PA-C 05/01/2019, 11:51 AM   I spent a total of 20 minutes in face to face in clinical consultation,  greater than 50% of which was counseling/coordinating care for disc aspiration

## 2019-05-01 NOTE — Progress Notes (Signed)
Initial Nutrition Assessment  DOCUMENTATION CODES:   Non-severe (moderate) malnutrition in context of chronic illness  INTERVENTION:    Boost Plus chocolate BID- Each supplement provides 360kcal and 14g protein.    MVI daily   NUTRITION DIAGNOSIS:   Moderate Malnutrition related to chronic illness(recurrent UTIs/urinary retention) as evidenced by severe muscle depletion, moderate muscle depletion, mild fat depletion.  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 Presents this admission with L3-5 discitis.   Pt denies having loss of appetite PTA. States he lives with his daughter who prepares three meals daily. Meals consist of B- eggs, oatmeal, grits, bacon L- fruit cup, deli sandwich D- microwave meat loaf with vegetables. Drinks Boost 1-2 times daily. Appetite decreased this admission due to feelings of constipation/nausea. Pt has bowel regimen in place. Will continue with Boost this admission.   Pt endorses a UBW of 190 lb and an unintentional wt loss of 10-15 lb in the last month. Records indicate pt weighed 170 lb on 9/29. Will need to obtain actual admission weight to assess for weight loss. Pt is mostly bed bound but has recently started working with PT at home.   Medications: miralax, senokot Labs: CBG 39-163   NUTRITION - FOCUSED PHYSICAL EXAM:    Most Recent Value  Orbital Region  Mild depletion  Upper Arm Region  Moderate depletion  Thoracic and Lumbar Region  Unable to assess  Buccal Region  Mild depletion  Temple Region  Mild depletion  Clavicle Bone Region  Moderate depletion  Clavicle and Acromion Bone  Region  Moderate depletion  Scapular Bone Region  Unable to assess  Dorsal Hand  Moderate depletion  Patellar Region  Severe depletion  Anterior Thigh Region  Severe depletion  Posterior Calf Region  Severe depletion  Edema (RD Assessment)  None  Hair  Reviewed  Eyes  Reviewed  Mouth  Reviewed  Skin  Reviewed  Nails  Reviewed     Diet Order:   Diet Order            Diet Heart Room service appropriate? Yes; Fluid consistency: Thin  Diet effective now              EDUCATION NEEDS:   Education needs have been addressed  Skin:  Skin Assessment: Skin Integrity Issues: Skin Integrity Issues:: Stage I Stage I: sacrum  Last BM:  11/9  Height:   Ht Readings from Last 1 Encounters:  04/30/19 5\' 9"  (1.753 m)    Weight:   Wt Readings from Last 1 Encounters:  04/30/19 77.1 kg    Ideal Body Weight:  72.7 kg  BMI:  Body mass index is 25.1 kg/m.  Estimated Nutritional Needs:   Kcal:  2250-2450 kcal  Protein:  115-130 grams  Fluid:  >/= 2.2 L/day   Mariana Single RD, LDN Clinical Nutrition Pager # - [phone]

## 2019-05-02 ENCOUNTER — Encounter (HOSPITAL_COMMUNITY): Payer: Self-pay | Admitting: Interventional Radiology

## 2019-05-02 ENCOUNTER — Inpatient Hospital Stay (HOSPITAL_COMMUNITY): Payer: Medicare Other

## 2019-05-02 HISTORY — PX: IR LUMBAR DISC ASPIRATION W/IMG GUIDE: IMG5306

## 2019-05-02 LAB — CBC
HCT: 39.5 % (ref 39.0–52.0)
Hemoglobin: 12.9 g/dL — ABNORMAL LOW (ref 13.0–17.0)
MCH: 28.9 pg (ref 26.0–34.0)
MCHC: 32.7 g/dL (ref 30.0–36.0)
MCV: 88.6 fL (ref 80.0–100.0)
Platelets: 253 10*3/uL (ref 150–400)
RBC: 4.46 MIL/uL (ref 4.22–5.81)
RDW: 13.6 % (ref 11.5–15.5)
WBC: 11.9 10*3/uL — ABNORMAL HIGH (ref 4.0–10.5)
nRBC: 0 % (ref 0.0–0.2)

## 2019-05-02 LAB — BASIC METABOLIC PANEL
Anion gap: 9 (ref 5–15)
BUN: 16 mg/dL (ref 8–23)
CO2: 26 mmol/L (ref 22–32)
Calcium: 8.4 mg/dL — ABNORMAL LOW (ref 8.9–10.3)
Chloride: 99 mmol/L (ref 98–111)
Creatinine, Ser: 0.8 mg/dL (ref 0.61–1.24)
GFR calc Af Amer: >60 mL/min (ref >60)
GFR calc non Af Amer: >60 mL/min (ref >60)
Glucose, Bld: 143 mg/dL — ABNORMAL HIGH (ref 70–99)
Potassium: 3.7 mmol/L (ref 3.5–5.1)
Sodium: 134 mmol/L — ABNORMAL LOW (ref 135–145)

## 2019-05-02 LAB — GLUCOSE, CAPILLARY
Glucose-Capillary: 142 mg/dL — ABNORMAL HIGH (ref 70–99)
Glucose-Capillary: 238 mg/dL — ABNORMAL HIGH (ref 70–99)
Glucose-Capillary: 382 mg/dL — ABNORMAL HIGH (ref 70–99)
Glucose-Capillary: 253 mg/dL — ABNORMAL HIGH (ref 70–99)

## 2019-05-02 LAB — URINE CULTURE: Culture: 50000 — AB

## 2019-05-02 MED ORDER — LIDOCAINE HCL 1 % IJ SOLN
INTRAMUSCULAR | Status: AC | PRN
  Administered 2019-05-02: 5 mL

## 2019-05-02 MED ORDER — FENTANYL CITRATE (PF) 100 MCG/2ML IJ SOLN
INTRAMUSCULAR | Status: AC
  Filled 2019-05-02: qty 2

## 2019-05-02 MED ORDER — MIDAZOLAM HCL 2 MG/2ML IJ SOLN
INTRAMUSCULAR | Status: AC
  Filled 2019-05-02: qty 2

## 2019-05-02 MED ORDER — MIDAZOLAM HCL 2 MG/2ML IJ SOLN
INTRAMUSCULAR | Status: AC | PRN
  Administered 2019-05-02: 1 mg via INTRAVENOUS

## 2019-05-02 MED ORDER — LIDOCAINE HCL 1 % IJ SOLN
INTRAMUSCULAR | Status: AC
  Filled 2019-05-02: qty 20

## 2019-05-02 MED ORDER — INSULIN ASPART 100 UNIT/ML ~~LOC~~ SOLN
0.0000 [IU] | Freq: Three times a day (TID) | SUBCUTANEOUS | Status: DC
  Administered 2019-05-02 – 2019-05-03 (×2): 5 [IU] via SUBCUTANEOUS
  Administered 2019-05-03: 3 [IU] via SUBCUTANEOUS
  Administered 2019-05-04 (×3): 2 [IU] via SUBCUTANEOUS
  Administered 2019-05-05: 9 [IU] via SUBCUTANEOUS
  Administered 2019-05-05: 3 [IU] via SUBCUTANEOUS

## 2019-05-02 MED ORDER — FENTANYL CITRATE (PF) 100 MCG/2ML IJ SOLN
INTRAMUSCULAR | Status: AC | PRN
  Administered 2019-05-02: 50 ug via INTRAVENOUS

## 2019-05-02 MED ORDER — VANCOMYCIN HCL 10 G IV SOLR
1750.0000 mg | INTRAVENOUS | Status: DC
  Administered 2019-05-02 – 2019-05-04 (×3): 1750 mg via INTRAVENOUS
  Filled 2019-05-02 (×4): qty 1750

## 2019-05-02 MED ORDER — INSULIN GLARGINE 100 UNIT/ML ~~LOC~~ SOLN
10.0000 [IU] | Freq: Every day | SUBCUTANEOUS | Status: DC
  Administered 2019-05-02 – 2019-05-05 (×4): 10 [IU] via SUBCUTANEOUS
  Filled 2019-05-02 (×6): qty 0.1

## 2019-05-02 MED ORDER — SODIUM CHLORIDE 0.9 % IV SOLN
2.0000 g | Freq: Three times a day (TID) | INTRAVENOUS | Status: DC
  Administered 2019-05-02 – 2019-05-04 (×6): 2 g via INTRAVENOUS
  Filled 2019-05-02 (×7): qty 2

## 2019-05-02 NOTE — Progress Notes (Signed)
Pharmacy Antibiotic Note  [name] is a 78 y.o. male admitted on 04/30/2019 with hallucinations (possibly related to antidepressant medications) and was found to have discitis of lumbosacral region. Pharmacy has been consulted for cefepime and vancomycin dosing for osteomyelitis.  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 Antibiotics were held until this procedure in order to obtain cultures off antibiotics. Per provider note, anticipating 4-6 weeks of antibiotic therapyh.  WBC 11.9, afebrile, Scr 0;.80, CrCl 77.3 ml/min (renal function stable)  Plan: Cefepime 2 gm IV Q 8 hrs Vancomycin 1750 mg IV Q 24 hrs (estimated vancomycin AUC on this regimen, using Scr 0.8, is 492.9; goal vancomycin AUC is 400-550) Monitor renal function, WBC, temp, clinical improvement, cultures, vancomycin levels  Height: 5\' 9"  (175.3 cm) Weight: 158 lb 8.2 oz (71.9 kg) IBW/kg (Calculated) : 70.7  Temp (24hrs), Avg:98.1 F (36.7 C), Min:97.8 F (36.6 C), Max:98.3 F (36.8 C)  Recent Labs  Lab 04/30/19 0645 05/01/19 0520 05/02/19 0543  WBC 16.8* 12.8* 11.9*  CREATININE 0.89 0.85 0.80    Estimated Creatinine Clearance: 77.3 mL/min (by C-G formula based on SCr of 0.8 mg/dL).    Allergies  Allergen Reactions  . Prevnar [Pneumococcal 13-Val Conj Vacc] Anaphylaxis  . Actos [Pioglitazone Hydrochloride] Other (See Comments)    Afib  . Bupropion Other (See Comments)    Pt reports of hallucination   . Hydrochlorothiazide Other (See Comments)    Lower potassium to low  . Sertraline Other (See Comments)    Pt reports of hallucination   . Penicillins Rash    Tolerated cefepime, ceftriaxone  Did it involve swelling of the face/tongue/throat, SOB, or low BP? No Did it involve sudden or  severe rash/hives, skin peeling, or any reaction on the inside of your mouth or nose?Yes Did you need to seek medical attention at a hospital or doctor's office. Yes When did it last happen?78 years old If all above answers are "NO", may proceed with cephalosporin use.    Microbiology results: 11/10 BCx X 2: pending 11/8 UCx: 50,000 colonies/ml yeast 9/29 UCx: >100,000 colonies/ml E Coli (suscept to all abx tested except ampicillin, Unasyn) 11/8 COVID: negative  11/10 Lumbar disc aspirate: Gram stain: rare WBC (predominantly PMNs), NOS; cx pending  Thank you for allowing pharmacy to be a part of this patient's care.  Gillermina Hu, PharmD, BCPS, [facility] Clinical Pharmacist 05/02/2019 5:08 PM

## 2019-05-02 NOTE — Plan of Care (Signed)
  Problem: Education: Goal: Knowledge of General Education information will improve Description: Including pain rating scale, medication(s)/side effects and non-pharmacologic comfort measures Outcome: Progressing   Problem: Clinical Measurements: Goal: Diagnostic test results will improve Outcome: Progressing   Problem: Elimination: Goal: Will not experience complications related to urinary retention Outcome: Progressing   

## 2019-05-02 NOTE — Progress Notes (Signed)
   Subjective: [name] was evaluated at bedside this morning. He has returned from his biopsy and is resting comfortably in bed. He denies any pain or discomfort. He reports that he continues to urinate. We discussed his significant urinary retention and recommendations for catheter. He will think about it.   Objective:  Vital signs in last 24 hours: Vitals:   05/02/19 0840 05/02/19 0845 05/02/19 0850 05/02/19 1018  BP: 128/62 115/61 131/60 140/62  Pulse: 65 61 (!) 58 62  Resp: 15 (!) 9  17  Temp:      TempSrc:      SpO2: 100% 100% 97% 97%  Weight:      Height:       Physical Exam Vitals signs and nursing note reviewed.  Constitutional:      General: He is not in acute distress.    Appearance: Normal appearance. He is not ill-appearing.  Abdominal:     General: Bowel sounds are normal. There is distension.     Tenderness: There is no abdominal tenderness. There is no guarding or rebound.     Comments: Bladder palpated to above navel with mild abdominal distension. POCUS used to evaluate bladder which demonstrated some gravity dependent haziness in the bladder Bilateral hydronephrosis noted as well   Neurological:     Mental Status: He is alert and oriented to person, place, and time. Mental status is at baseline.    Assessment/Plan:  [name] is a 78 year old male with PMHx of DLBCL in remission, A.fib, HTN, DM, and prior CVA presenting with one week of intermittent hallucinations, chronic constipation with one day of nausea/vomiting, and chronic urinary retention w/overflow incontinence. Labs significant for neutrophil predominant leukocytosis. UA consistent with sterile pyuria. CT Abdomen and MRI lumbar spine concerning for discitis/osteomyelitis at L3-L4 and L4-5 with phlegmon spanning L3-5 and measuring up to 66mm s/p aspiration today.   Discitis vs Osteomyletitis: Patient with history of lumbar fracture on 9/3 in TLSO brace. CT and MRI finding of L3-5  discitis/osteomyelitis with phlegmon spanning L3-5, up to 25mm in size. He still has some pain on movement but reports it is improved. Leukocytosis present but improving. He had IR guided biopsy today with 80ml of thin cloudy fluid aspirated.  - Started on IV vancomycin and cefepime  - f/u cultures  - CBC daily   Hallucinations:  No overnight hallucinations reported.  - Continue to hold zofran and wellbutrin  Chronic asymptomatic bacteruria:  Patient with chronic urinary retention with overflow incontinence 2/2 urethral stricture. He has bilateral hydronephrosis but has refused urethral catheterization and suprapubic catheterization thusfar. He continues to have overflow incontinence today. Renal function is stable today but will continue to monitor for any worsening of renal function or if patient becomes oliguric at which point he will need catheter placement. Discussed need for catheterization and risk of renal damage. Patient expresses understand.  - Continue to monitor with daily BMP   Dispo: Anticipated discharge pending clinical improvement.   Harvie Heck, MD  Internal Medicine, PGY-1 05/02/2019, 11:57 AM Pager: [phone]

## 2019-05-02 NOTE — Procedures (Signed)
  Procedure: Lumbar L3-4 disc aspiration 32ml thin cloudy fluid for GS, C&S EBL:   minimal Complications:  none immediate  See full dictation in [employer].  Dillard Cannon MD Main # [phone] Pager  [phone]

## 2019-05-02 NOTE — TOC Initial Note (Signed)
Transition of Care [facility]) - Initial/Assessment Note    Patient Details  Name: [name] MRN: [id number] Date of Birth: Nov 07, 1940  Transition of Care [facility]) CM/SW Contact:    Bartholomew Crews, RN Phone Number: [phone] 05/02/2019, 1:37 PM  Clinical Narrative:                 Spoke with patient and daughter at bedside. PTA was home with daughter and [place] for RN and PT. Patient will need Home Health RN and PT orders with Face to Face at discharge. No DME needs verbalized, however, patient may benefit from hoyer lift at home. No concerns about medications at home. Daughter states that medications are provided through [facility]. Daughter also advised that patient will require PTAR transport at discharge. Following for transition needs.   Expected Discharge Plan: [place] Barriers to Discharge: Continued Medical Work up   Patient Goals and CMS Choice Patient states their goals for this hospitalization and ongoing recovery are:: return home with daughter CMS Medicare.gov Compare Post Acute Care list provided to:: Patient Choice offered to / list presented to : Patient, Adult Children  Expected Discharge Plan and Services Expected Discharge Plan: [place] In-house Referral: NA Discharge Planning Services: CM Consult Post Acute Care Choice: [place] arrangements for the past 2 months: Single Family Home                 DME Arranged: N/A DME Agency: NA       HH Arranged: PT, RN [place] Agency: [place] ([place]) Date HH Agency Contacted: 05/02/19 Time [place]: [phone] Representative spoke with at [place]: Butch Penny  Prior Living Arrangements/Services Living arrangements for the past 2 months: [place] with:: Self, Adult Children Patient language and need for interpreter reviewed:: Yes Do you feel safe going back to the place where you live?: Yes      Need for Family Participation  in Patient Care: Yes (Comment) Care giver support system in place?: Yes (comment) Current home services: Home PT, Home RN Criminal Activity/Legal Involvement Pertinent to Current Situation/Hospitalization: No - Comment as needed  Activities of Daily [place] Devices/Equipment: Cane (specify quad or straight), Walker (specify type), Wheelchair ADL Screening (condition at time of admission) Patient's cognitive ability adequate to safely complete daily activities?: Yes Is the patient deaf or have difficulty hearing?: Yes Does the patient have difficulty seeing, even when wearing glasses/contacts?: No Does the patient have difficulty concentrating, remembering, or making decisions?: No Patient able to express need for assistance with ADLs?: Yes Does the patient have difficulty dressing or bathing?: No Independently performs ADLs?: No Communication: Independent Dressing (OT): Independent Grooming: Needs assistance Is this a change from baseline?: Pre-admission baseline Feeding: Independent Bathing: Needs assistance Is this a change from baseline?: Pre-admission baseline Toileting: Needs assistance Is this a change from baseline?: Pre-admission baseline In/Out Bed: Needs assistance Is this a change from baseline?: Pre-admission baseline Walks in Home: Dependent Is this a change from baseline?: Pre-admission baseline Does the patient have difficulty walking or climbing stairs?: Yes Weakness of Legs: Both Weakness of Arms/Hands: None  Permission Sought/Granted Permission sought to share information with : Family Supports Permission granted to share information with : Yes, Verbal Permission Granted, Yes, Release of Information Signed  Share Information with NAME: Thea Silversmith     Permission granted to share info w Relationship: daughter  Permission granted to share info w Contact  Information: [phone]  Emotional Assessment Appearance:: Appears stated  age Attitude/Demeanor/Rapport: Engaged Affect (typically observed): Accepting Orientation: : Oriented to Self, Oriented to Place, Oriented to  Time, Oriented to Situation Alcohol / Substance Use: Not Applicable Psych Involvement: No (comment)  Admission diagnosis:  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 Pressure injury of skin 05/01/2019  . Malnutrition of moderate degree 05/01/2019  . Discitis of lumbosacral region 04/30/2019  . UTI (urinary tract infection) 03/21/2019  . Weakness generalized 02/23/2019  . Lumbar compression fracture ([place]) 02/08/2019  . Closed compression fracture of L3 lumbar vertebra, initial encounter ([place]) 02/08/2019  . Laryngitis   . Acute non-recurrent sinusitis   . Sore throat   . Cough   . Overflow incontinence of urine   . Urinary retention   . Acute lower UTI   . Urinary urgency   . Hypoalbuminemia due to protein-calorie malnutrition ([place])   . 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 Benign essential HTN   . H/O urethral stricture   . Acute blood loss anemia   . Poorly controlled type 2 diabetes mellitus with peripheral neuropathy ([place])   . Debility 06/28/2017  . Personal history of urethral stricture 06/23/2017  . Rhabdomyolysis 06/23/2017  . Near syncope 09/23/2015  . PAF (paroxysmal atrial fibrillation) ([place]) 09/23/2015  . Diabetes mellitus type 2, uncontrolled ([place]) 09/23/2015  . 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 Vertebrobasilar artery syndrome   . Orthostatic hypotension   . Insulin dependent diabetes mellitus   . Falls frequently   . Weakness 08/15/2015  . Encephalopathy 05/29/2014  . Lower urinary tract  infectious disease 09/16/2013  . Recurrent falls 09/16/2013  . History of CVA (cerebrovascular accident) 09/16/2013  . Leukocytosis 09/16/2013  . Thrombocytopenia ([place]) 09/16/2013  . B-cell lymphoma ([place]) 10/09/2011  . Diabetes mellitus ([place]) 10/09/2011  . Hypertension 10/09/2011  . A-fib [facility])    PCP:  Alroy Dust, L.Marlou Sa, MD Pharmacy:   [facility], [address] [place] [address] Phone: [phone] Fax: [phone]  [id number] [address], [address] [place] [address] Phone: [phone] Fax: [phone]     Social Determinants of Health ([place]) Interventions    Readmission Risk Interventions Readmission Risk Prevention Plan 03/23/2019  Transportation Screening Complete  PCP or Specialist Appt within 3-5 Days Not Complete  Not Complete comments SNF placement planned  Some recent data might be hidden

## 2019-05-03 DIAGNOSIS — E1140 Type 2 diabetes mellitus with diabetic neuropathy, unspecified: Secondary | ICD-10-CM

## 2019-05-03 LAB — HIV ANTIBODY (ROUTINE TESTING W REFLEX): HIV Screen 4th Generation wRfx: NONREACTIVE

## 2019-05-03 LAB — CBC
HCT: 38.2 % — ABNORMAL LOW (ref 39.0–52.0)
Hemoglobin: 12.5 g/dL — ABNORMAL LOW (ref 13.0–17.0)
MCH: 28.9 pg (ref 26.0–34.0)
MCHC: 32.7 g/dL (ref 30.0–36.0)
MCV: 88.4 fL (ref 80.0–100.0)
Platelets: 250 10*3/uL (ref 150–400)
RBC: 4.32 MIL/uL (ref 4.22–5.81)
RDW: 13.6 % (ref 11.5–15.5)
WBC: 14.8 10*3/uL — ABNORMAL HIGH (ref 4.0–10.5)
nRBC: 0 % (ref 0.0–0.2)

## 2019-05-03 LAB — BASIC METABOLIC PANEL
Anion gap: 10 (ref 5–15)
BUN: 19 mg/dL (ref 8–23)
CO2: 23 mmol/L (ref 22–32)
Calcium: 7.9 mg/dL — ABNORMAL LOW (ref 8.9–10.3)
Chloride: 102 mmol/L (ref 98–111)
Creatinine, Ser: 0.75 mg/dL (ref 0.61–1.24)
GFR calc Af Amer: >60 mL/min (ref >60)
GFR calc non Af Amer: >60 mL/min (ref >60)
Glucose, Bld: 127 mg/dL — ABNORMAL HIGH (ref 70–99)
Potassium: 4 mmol/L (ref 3.5–5.1)
Sodium: 135 mmol/L (ref 135–145)

## 2019-05-03 LAB — GLUCOSE, CAPILLARY
Glucose-Capillary: 91 mg/dL (ref 70–99)
Glucose-Capillary: 272 mg/dL — ABNORMAL HIGH (ref 70–99)
Glucose-Capillary: 249 mg/dL — ABNORMAL HIGH (ref 70–99)
Glucose-Capillary: 157 mg/dL — ABNORMAL HIGH (ref 70–99)

## 2019-05-03 NOTE — Progress Notes (Signed)
Subjective: [name] was evaluated at bedside this morning. He is resting comfortably in bed. He worked with physical therapy earlier in the morning and reports that was painful for him. 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 However, after his recent fall in which he sustained lumbar fracture, he has become much more bed bound. He would likely need rehab facility on discharge.   We discussed his need for continued IV antibiotics for the discitis; he expresses understanding.  He also denies any hallucinations during this admission since discontinuation of the zoloft and wellbutrin.   Objective:  Vital signs in last 24 hours: Vitals:   05/02/19 0850 05/02/19 1018 05/02/19 2026 05/03/19 0357  BP: 131/60 140/62 (!) 155/57 (!) 139/56  Pulse: (!) 58 62 70 63  Resp:  17 16 16   Temp:   97.8 F (36.6 C) 97.9 F (36.6 C)  TempSrc:   Oral Oral  SpO2: 97% 97% 97% 94%  Weight:    71.4 kg  Height:       Physical Exam Vitals signs and nursing note reviewed.  Constitutional:      General: He is not in acute distress.    Appearance: Normal appearance. He is not ill-appearing.  Abdominal:     General: Bowel sounds are normal. There is distension.     Tenderness: There is no abdominal tenderness. There is no guarding or rebound.     Comments: Bladder palpated to above navel with mild abdominal distension.  Neurological:     Mental Status: He is alert and oriented to person, place, and time. Mental status is at baseline.     Sensory: Sensory deficit present.     Motor: Weakness present.     Comments: Patient has decreased sensation in bilateral lower extremities up to the knee which he reports has been there since 1992 and is unchanged He has 5/5 strength in LLE and 3/5 strength in RLE secondary to his prior CVA    Assessment/Plan:  [name] is a 77 year old male with PMHx of DLBCL in remission, A.fib, HTN, DM, and prior CVA  presenting with one week of intermittent hallucinations, chronic constipation with one day of nausea/vomiting, and chronic urinary retention w/overflow incontinence. Labs significant for neutrophil predominant leukocytosis. UA consistent with sterile pyuria. CT Abdomen and MRI lumbar spine concerning for discitis/osteomyelitis at L3-L4 and L4-5 with phlegmon spanning L3-5 and measuring up to 62mm s/p aspiration yesterday.   Discitis vs Osteomyletitis: Patient with history of lumbar fracture after a fall on 8/19. He has been in a TLSO brace; however, reports that he has essentially been unable to get out of bed and care for himself. He is mostly dependent on his family for his ADLs. On admission, he had discitis/osteomyelitis of L3-5 s/p IR guided biopsy yesterday. Culture is pending; gram stain with rare WBC's without any organisms present. He is continued on empiric antibiotics. Will await cultures to narrow antibiotic therapy. Patient will need 4-6 weeks of IV antibiotics as outpatient. Will get ID recommendations pending culture results.  He reports working with physical therapy earlier this morning and reports extreme pain during the activity. Due to severe deconditioning, he will likely need SNF placement. Will discuss with SW.  - Continue IV vancomycin and cefepime  - f/u cultures  - CBC daily   Hallucinations:  Patient's hallucinations have resolved since discontinuation of zoloft and wellbutrin.  - Continue to hold zoloft and wellbutrin  Chronic asymptomatic bacteruria:  Patient with chronic urinary retention with overflow incontinence 2/2 urethral stricture.He continues to have overflow incontinence today. Renal function is stable today but will continue to monitor for any worsening of renal function or if patient becomes oliguric at which point he will need catheter placement.  - Continue to monitor with daily BMP   Dispo: Anticipated discharge pending clinical improvement.   [name] Heck, MD  Internal Medicine, PGY-1 05/03/2019, 6:52 AM Pager: [phone]

## 2019-05-03 NOTE — Progress Notes (Signed)
Physical Therapy Treatment Patient Details Name: [name] MRN: [id number] DOB: 1940/11/04 Today's Date: 05/03/2019    History of Present Illness 78 y.o. 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 CT abdomen concerning for possible discitis osteomyelitis of prior L3-L4 fracture.    PT Comments    Pt progressing towards physical therapy goals. Tolerated EOB activity ~8 minutes this session where we focused on trunk stabilization and sitting balance. Pt self-limiting at times, requesting therapist pick him up instead of attempting the activity first, and insisting he cannot use the bedpan or urinal and that his only option is voiding in the bed. Will continue to follow and progress as able per POC.    Follow Up Recommendations  Home health PT;Supervision/Assistance - 24 hour     Equipment Recommendations  None recommended by PT    Recommendations for Other Services       Precautions / Restrictions Precautions Precautions: Fall;Back Precaution Comments: Reviewed back precautions for comfort throughout functional mobility.  Required Braces or Orthoses: Spinal Brace Spinal Brace: Lumbar corset;Applied in supine position Restrictions Weight Bearing Restrictions: Yes Other Position/Activity Restrictions: No order in chart for back brace    Mobility  Bed Mobility Overal bed mobility: Needs Assistance Bed Mobility: Rolling;Sidelying to Sit;Sit to Sidelying Rolling: Min assist Sidelying to sit: Mod assist;+2 for physical assistance     Sit to sidelying: Mod assist;+2 for physical assistance General bed mobility comments: Heavy use of rail. Pt prefers to roll R but also did fairly well rolling L at end of session to remove brace.  +2 assist for transition to/from sitting EOB due to R lateral lean and R side weakness.   Transfers                 General transfer comment: PTA pt only transferred OOB during PT, total assist  Ambulation/Gait             General Gait Details: pt reports he was able to ambulate around the house ~4-6 weeks ago but has been wheelchair dependent since.    Stairs             Wheelchair Mobility    Modified Rankin (Stroke Patients Only)       Balance Overall balance assessment: Needs assistance Sitting-balance support: Feet supported;No upper extremity supported Sitting balance-Leahy Scale: Poor Sitting balance - Comments: Requires at least 1UE support and/or support from therapist to maintain sitting balance EOB  Postural control: Right lateral lean     Standing balance comment: Unable to assess                            Cognition Arousal/Alertness: Awake/alert Behavior During Therapy: WFL for tasks assessed/performed Overall Cognitive Status: Within Functional Limits for tasks assessed                                        Exercises General Exercises - Lower Extremity Ankle Circles/Pumps: AROM;Both;10 reps;Supine Long Arc Quad: 5 reps;Both;AROM Other Exercises Other Exercises: Sitting EOB pt was able to reach x5 each side for targets. Able to tolerate reaching farther outside BOS on the L vs the R.     General Comments  Pertinent Vitals/Pain Pain Assessment: Faces Faces Pain Scale: Hurts even more Pain Location: back  Pain Descriptors / Indicators: Guarding;Grimacing;Moaning Pain Intervention(s): Limited activity within patient's tolerance;Monitored during session;Repositioned    Home Living                      Prior Function            PT Goals (current goals can now be found in the care plan section) Acute Rehab PT Goals Patient Stated Goal: decrease pain, use RUE again, walk again PT Goal  Formulation: With patient/family Time For Goal Achievement: 05/15/19 Potential to Achieve Goals: Fair Progress towards PT goals: Progressing toward goals    Frequency    Min 3X/week      PT Plan Current plan remains appropriate    Co-evaluation              AM-PAC PT "6 Clicks" Mobility   Outcome Measure  Help needed turning from your back to your side while in a flat bed without using bedrails?: A Little Help needed moving from lying on your back to sitting on the side of a flat bed without using bedrails?: A Lot Help needed moving to and from a bed to a chair (including a wheelchair)?: Total Help needed standing up from a chair using your arms (e.g., wheelchair or bedside chair)?: Total Help needed to walk in hospital room?: Total Help needed climbing 3-5 steps with a railing? : Total 6 Click Score: 9    End of Session Equipment Utilized During Treatment: Gait belt Activity Tolerance: Patient limited by pain Patient left: in bed;with call bell/phone within reach;with bed alarm set Nurse Communication: Mobility status PT Visit Diagnosis: Other abnormalities of gait and mobility (R26.89);Pain Pain - part of body: (back)     Time: [id number] PT Time Calculation (min) (ACUTE ONLY): 15 min  Charges:  $Therapeutic Activity: 8-22 mins                     Rolinda Roan, PT, DPT Acute Rehabilitation Services Pager: [phone] Office: [phone]    Thelma Comp 05/03/2019, 11:57 AM

## 2019-05-04 LAB — CBC
HCT: 40.1 % (ref 39.0–52.0)
Hemoglobin: 12.9 g/dL — ABNORMAL LOW (ref 13.0–17.0)
MCH: 28.6 pg (ref 26.0–34.0)
MCHC: 32.2 g/dL (ref 30.0–36.0)
MCV: 88.9 fL (ref 80.0–100.0)
Platelets: 246 10*3/uL (ref 150–400)
RBC: 4.51 MIL/uL (ref 4.22–5.81)
RDW: 13.5 % (ref 11.5–15.5)
WBC: 14.4 10*3/uL — ABNORMAL HIGH (ref 4.0–10.5)
nRBC: 0 % (ref 0.0–0.2)

## 2019-05-04 LAB — BASIC METABOLIC PANEL
Anion gap: 9 (ref 5–15)
BUN: 17 mg/dL (ref 8–23)
CO2: 25 mmol/L (ref 22–32)
Calcium: 8.4 mg/dL — ABNORMAL LOW (ref 8.9–10.3)
Chloride: 103 mmol/L (ref 98–111)
Creatinine, Ser: 0.76 mg/dL (ref 0.61–1.24)
GFR calc Af Amer: >60 mL/min (ref >60)
GFR calc non Af Amer: >60 mL/min (ref >60)
Glucose, Bld: 207 mg/dL — ABNORMAL HIGH (ref 70–99)
Potassium: 4.1 mmol/L (ref 3.5–5.1)
Sodium: 137 mmol/L (ref 135–145)

## 2019-05-04 LAB — GLUCOSE, CAPILLARY
Glucose-Capillary: 174 mg/dL — ABNORMAL HIGH (ref 70–99)
Glucose-Capillary: 157 mg/dL — ABNORMAL HIGH (ref 70–99)
Glucose-Capillary: 191 mg/dL — ABNORMAL HIGH (ref 70–99)
Glucose-Capillary: 260 mg/dL — ABNORMAL HIGH (ref 70–99)

## 2019-05-04 MED ORDER — SODIUM CHLORIDE 0.9 % IV SOLN
2.0000 g | INTRAVENOUS | Status: DC
  Administered 2019-05-05: 2 g via INTRAVENOUS
  Filled 2019-05-04: qty 2

## 2019-05-04 NOTE — Progress Notes (Signed)
   Subjective: Mr. Lundstrom was evaluated at bedside this morning. He is resting comfortably in bed and does not report any acute distress. He reports having several bowel movements over night and continues to urinate without any issues. He denies any current back pain.   Objective:  Vital signs in last 24 hours: Vitals:   05/03/19 0857 05/03/19 1631 05/03/19 2102 05/04/19 0454  BP: (!) 156/46 (!) 131/50 (!) 144/56 (!) 150/61  Pulse: 67 68 (!) 53 70  Resp: 18 18 16 18   Temp: 97.7 F (36.5 C) 98.3 F (36.8 C) 98.3 F (36.8 C) 97.9 F (36.6 C)  TempSrc: Oral Oral Oral Oral  SpO2: 99% 96% 93% 98%  Weight:      Height:       Physical Exam Vitals signs and nursing note reviewed.  Constitutional:      General: He is not in acute distress.    Appearance: Normal appearance. He is not ill-appearing.  Abdominal:     General: Bowel sounds are normal. There is distension.     Tenderness: There is no abdominal tenderness. There is no guarding or rebound.     Comments: Bladder palpated to approximately 3cm above navel with mild abdominal distension.  Neurological:     Mental Status: He is alert and oriented to person, place, and time. Mental status is at baseline.    Assessment/Plan:  Mr. Keomany is a 78 year old male with PMHx of DLBCL in remission, A.fib, HTN, DM, and prior CVA presenting with one week of intermittent hallucinations, chronic constipation with one day of nausea/vomiting, and chronic urinary retention w/overflow incontinence. Labs significant for neutrophil predominant leukocytosis. UA consistent with sterile pyuria. CT Abdomen and MRI lumbar spine concerning for discitis/osteomyelitis at L3-L4 and L4-5 with phlegmon spanning L3-5 and measuring up to 25mm s/p aspiration yesterday.   Discitis vs Osteomyletitis: Patient with history of lumbar fracture after a fall on 8/19. He has been in a TLSO brace; however, reports that he has essentially been unable to get out of bed and  care for himself. He is mostly dependent on his family for his ADLs. 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 Gram stain positive for rare WBC, mostly PMNs without any organisms and cultures negative to date. He is on empiric antibiotics. Discussed with ID who recommended for patient to be continued on IV vancomycin and ceftriaxone for 6-8 weeks.  He has been living at home with home health and has previously had poor experience with nursing facilities. However, due to severe deconditioning, he will likely need SNF placement.  - Continue IV vancomycin and cefepime inpatient with plans to continue IV vancomycin and ceftriaxone for 6-8 weeks on discharge  - CBC daily   Chronic asymptomatic bacteruria:  Patient with chronic urinary retention with overflow incontinence 2/2 urethral stricture.He continues to have overflow incontinence today. Renal function is stable today but will continue to monitor for any worsening of renal function or if patient becomes oliguric at which point he will need catheter placement.  - Continue to monitor with daily BMP   Dispo: Anticipated discharge pending clinical improvement.   Harvie Heck, MD  Internal Medicine, PGY-1 05/04/2019, 7:33 AM Pager: [phone]

## 2019-05-04 NOTE — Care Management Important Message (Signed)
Important Message  Patient Details  Name: [name] MRN: [id number] Date of Birth: 07/31/1940   Medicare Important Message Given:  Yes     [name] [address] 05/04/2019, 3:35 PM

## 2019-05-04 NOTE — TOC Progression Note (Signed)
Transition of Care [facility]) - Progression Note    Patient Details  Name: [name] MRN: [id number] Date of Birth: 1940/11/30  Transition of Care [facility]) CM/SW Contact  Bartholomew Crews, RN Phone Number: [phone] 05/04/2019, 12:43 PM  Clinical Narrative:    Spoke with patient and daughter [name]) at he bedside concerning plans to transition home versus skilled rehabilitation. Patient is not interested in skilled rehab at this time, and daughter is willing to support his decision. [facility] is following for home health RN, PT, OT and [facility] is following for IV antibiotics at home. Daughter stated that she has already received call from [facility]. Patient will require PTAR transport home when medically ready. Patient also has a transportation benefit through [facility] for wheelchair transportation that daughter knows how to access. TOC following for transition needs.    Expected Discharge Plan: [place] Barriers to Discharge: Continued Medical Work up  Expected Discharge Plan and Services Expected Discharge Plan: [place] In-house Referral: NA Discharge Planning Services: CM Consult Post Acute Care Choice: [place] arrangements for the past 2 months: Single Family Home                 DME Arranged: N/A DME Agency: NA       HH Arranged: PT, RN [place] Agency: [place] ([facility]) Date HH Agency Contacted: 05/02/19 Time [place]: [place] Representative spoke with at [place]: [place] ([place]) Interventions    Readmission Risk Interventions Readmission Risk Prevention Plan 03/23/2019  Transportation Screening Complete  PCP or Specialist Appt within 3-5 Days Not Complete  Not Complete comments SNF placement planned  Some recent data might be hidden

## 2019-05-05 ENCOUNTER — Inpatient Hospital Stay: Payer: Self-pay

## 2019-05-05 DIAGNOSIS — F329 Major depressive disorder, single episode, unspecified: Secondary | ICD-10-CM

## 2019-05-05 DIAGNOSIS — M4627 Osteomyelitis of vertebra, lumbosacral region: Secondary | ICD-10-CM

## 2019-05-05 DIAGNOSIS — Z87891 Personal history of nicotine dependence: Secondary | ICD-10-CM

## 2019-05-05 DIAGNOSIS — M4647 Discitis, unspecified, lumbosacral region: Secondary | ICD-10-CM

## 2019-05-05 DIAGNOSIS — Z888 Allergy status to other drugs, medicaments and biological substances status: Secondary | ICD-10-CM

## 2019-05-05 DIAGNOSIS — Z8572 Personal history of non-Hodgkin lymphomas: Secondary | ICD-10-CM

## 2019-05-05 DIAGNOSIS — Z79899 Other long term (current) drug therapy: Secondary | ICD-10-CM

## 2019-05-05 DIAGNOSIS — Z887 Allergy status to serum and vaccine status: Secondary | ICD-10-CM

## 2019-05-05 DIAGNOSIS — Z880 Allergy status to penicillin: Secondary | ICD-10-CM

## 2019-05-05 DIAGNOSIS — I4891 Unspecified atrial fibrillation: Secondary | ICD-10-CM

## 2019-05-05 DIAGNOSIS — D72829 Elevated white blood cell count, unspecified: Secondary | ICD-10-CM

## 2019-05-05 DIAGNOSIS — N320 Bladder-neck obstruction: Secondary | ICD-10-CM

## 2019-05-05 DIAGNOSIS — E119 Type 2 diabetes mellitus without complications: Secondary | ICD-10-CM

## 2019-05-05 DIAGNOSIS — M4856XD Collapsed vertebra, not elsewhere classified, lumbar region, subsequent encounter for fracture with routine healing: Secondary | ICD-10-CM

## 2019-05-05 LAB — GLUCOSE, CAPILLARY
Glucose-Capillary: 117 mg/dL — ABNORMAL HIGH (ref 70–99)
Glucose-Capillary: 240 mg/dL — ABNORMAL HIGH (ref 70–99)
Glucose-Capillary: 392 mg/dL — ABNORMAL HIGH (ref 70–99)
Glucose-Capillary: 248 mg/dL — ABNORMAL HIGH (ref 70–99)
Glucose-Capillary: 217 mg/dL — ABNORMAL HIGH (ref 70–99)

## 2019-05-05 MED ORDER — CEFAZOLIN SODIUM-DEXTROSE 2-4 GM/100ML-% IV SOLN
2.0000 g | Freq: Three times a day (TID) | INTRAVENOUS | Status: DC
  Administered 2019-05-05 – 2019-05-06 (×4): 2 g via INTRAVENOUS
  Filled 2019-05-05 (×6): qty 100

## 2019-05-05 MED ORDER — INSULIN ASPART 100 UNIT/ML ~~LOC~~ SOLN
3.0000 [IU] | Freq: Three times a day (TID) | SUBCUTANEOUS | Status: DC
  Administered 2019-05-06 (×3): 3 [IU] via SUBCUTANEOUS

## 2019-05-05 MED ORDER — INSULIN ASPART 100 UNIT/ML ~~LOC~~ SOLN
0.0000 [IU] | Freq: Every day | SUBCUTANEOUS | Status: DC
  Administered 2019-05-05: 2 [IU] via SUBCUTANEOUS

## 2019-05-05 MED ORDER — CEFAZOLIN IV (FOR PTA / DISCHARGE USE ONLY): 2.0000 g | Freq: Three times a day (TID) | INTRAVENOUS | 0 refills | Status: AC

## 2019-05-05 MED ORDER — INSULIN ASPART 100 UNIT/ML ~~LOC~~ SOLN
0.0000 [IU] | Freq: Three times a day (TID) | SUBCUTANEOUS | Status: DC
  Administered 2019-05-06: 1 [IU] via SUBCUTANEOUS
  Administered 2019-05-06 (×2): 3 [IU] via SUBCUTANEOUS

## 2019-05-05 NOTE — Progress Notes (Addendum)
PHARMACY CONSULT NOTE FOR:  OUTPATIENT  PARENTERAL ANTIBIOTIC THERAPY (OPAT)  Indication: Discitis/Osteomyleitis Regimen: D/C ceftriaxone and vancomycin, Start cefazolin 2gm IV q8h End date: 06/13/19  IV antibiotic discharge orders are pended. To discharging provider:  please sign these orders via discharge navigator,  Select New Orders & click on the button choice - Manage This Unsigned Work.     Jahmari Esbenshade A. Levada Dy, PharmD, BCPS, FNKF Clinical Pharmacist [place] Please utilize Amion for appropriate phone number to reach the unit pharmacist ([place])   05/05/2019, 11:02 AM

## 2019-05-05 NOTE — Discharge Instructions (Signed)
[name], [place] were admitted for an infection in your lumbar spine. You were treated with IV antibiotics. You are being discharged to home with IV antibiotics that you will need to take for until December 21st.  You will have home health set up and physical therapy will come to work with you to help regain strength.   On discharge, please follow up with the following: PCP - schedule an appointment to see your PCP in 1-2 weeks of discharge Infectious disease - schedule an appointment to see Dr. Linus Salmons in 4 weeks. Urology - schedule an appointment with the urologist for evaluation of your urinary retention and need for catheter placement in 1-2 weeks   Thank you!

## 2019-05-05 NOTE — Progress Notes (Signed)
Pharmacy Antibiotic Note  [name] is a 78 y.o. male admitted on 04/30/2019 with hallucinations (possibly related to antidepressant medications) and was found to have discitis of lumbosacral region. Pharmacy has been consulted for vancomycin dosing for osteomyelitis.  Pt is S/P lumbar L3-4 disc aspiration this afternoon by IR. Antibiotics were held until this procedure in order to obtain cultures off antibiotics. ID has been consulted and abx changed to ceftriaxone and vancomycin; anticipating 6-8 weeks of antibiotic therapy.  WBC 14.4, afebrile, Scr 0.76, CrCl 77.3 ml/min (renal function stable)   Plan: Vancomycin 1750 mg IV Q 24 hrs (estimated vancomycin AUC on this regimen, using Scr 0.8, is 492.9; goal vancomycin AUC is 400-550) Vancomycin levels ordered after today's dose, f/u results on 11/14 Monitor renal function, WBC, temp, clinical improvement, cultures, vancomycin levels  Height: 5\' 9"  (175.3 cm) Weight: 157 lb 10.1 oz (71.5 kg) IBW/kg (Calculated) : 70.7  Temp (24hrs), Avg:98 F (36.7 C), Min:97.6 F (36.4 C), Max:98.4 F (36.9 C)  Recent Labs  Lab 04/30/19 0645 05/01/19 0520 05/02/19 0543 05/03/19 0415 05/04/19 0542  WBC 16.8* 12.8* 11.9* 14.8* 14.4*  CREATININE 0.89 0.85 0.80 0.75 0.76    Estimated Creatinine Clearance: 77.3 mL/min (by C-G formula based on SCr of 0.76 mg/dL).    Allergies  Allergen Reactions  . Prevnar [Pneumococcal 13-Val Conj Vacc] Anaphylaxis  . Actos [Pioglitazone Hydrochloride] Other (See Comments)    Afib  . Bupropion Other (See Comments)    Pt reports of hallucination   . Hydrochlorothiazide Other (See Comments)    Lower potassium to low  . Sertraline Other (See Comments)    Pt reports of hallucination   . Penicillins Rash    Tolerated cefepime, ceftriaxone  Did it involve swelling of the face/tongue/throat, SOB, or low BP? No Did it involve sudden or severe rash/hives, skin peeling, or any reaction on the inside of your  mouth or nose?Yes Did you need to seek medical attention at a hospital or doctor's office. Yes When did it last happen?78 years old If all above answers are "NO", may proceed with cephalosporin use.    Microbiology results: 11/10 BCx X 2: pending 11/8 UCx: 50,000 colonies/ml yeast 9/29 UCx: >100,000 colonies/ml E Coli (suscept to all abx tested except ampicillin, Unasyn) 11/8 COVID: negative  11/10 Lumbar disc aspirate: Gram stain :NGTD  Hawkins Seaman A. Levada Dy, PharmD, BCPS, FNKF Clinical Pharmacist [place] Please utilize Amion for appropriate phone number to reach the unit pharmacist ([place])     05/05/2019 8:35 AM

## 2019-05-05 NOTE — Discharge Summary (Signed)
Name: [name] MRN: [id number] DOB: 1941-05-11 78 y.o. PCP: Alroy Dust, L.Marlou Sa, MD  Date of Admission: 04/30/2019  6:22 AM Date of Discharge: 05/06/2019 05/06/2019 Attending Physician: Dr. Velna Ochs, MD  Discharge Diagnosis: 1. Discitis/osteomyelitis 2. Hallucinations 3. Urinary retention 2/2 urethral stricture with overflow incontinence  Discharge Medications: Allergies as of 05/06/2019      Reactions   Prevnar [pneumococcal 13-val Conj Vacc] Anaphylaxis   Actos [pioglitazone Hydrochloride] Other (See Comments)   Afib   Bupropion Other (See Comments)   Pt reports of hallucination    Hydrochlorothiazide Other (See Comments)   Lower potassium to low   Sertraline Other (See Comments)   Pt reports of hallucination    Penicillins Rash   Tolerated cefepime, ceftriaxone Did it involve swelling of the face/tongue/throat, SOB, or low BP? No Did it involve sudden or severe rash/hives, skin peeling, or any reaction on the inside of your mouth or nose?Yes Did you need to seek medical attention at a hospital or doctor's office. Yes When did it last happen?78 years old If all above answers are "NO", may proceed with cephalosporin use.      Medication List    STOP taking these medications   buPROPion 150 MG 24 hr tablet Commonly known as: WELLBUTRIN XL   sertraline 50 MG tablet Commonly known as: ZOLOFT     TAKE these medications   acetaminophen 500 MG tablet Commonly known as: TYLENOL Take 500 mg by mouth every 6 (six) hours as needed for mild pain.   amLODipine 5 MG tablet Commonly known as: NORVASC Take 1 tablet (5 mg total) by mouth 2 (two) times daily. What changed: when to take this   aspirin 81 MG EC tablet Take 1 tablet (81 mg total) by mouth daily. What changed: when to take this   bisacodyl 5 MG EC tablet Commonly known as: DULCOLAX Take 2 tablets (10 mg total) by mouth daily as needed for moderate constipation.   ceFAZolin  IVPB  Commonly known as: ANCEF Inject 2 g into the vein every 8 (eight) hours. Indication:  Discitis/Osteomyelitis Last Day of Therapy:  06/13/19 Labs - Once weekly:  CBC/D and BMP, Labs - Every other week:  ESR and CRP   clopidogrel 75 MG tablet Commonly known as: PLAVIX Take 75 mg by mouth daily.   HYDROcodone-acetaminophen 5-325 MG tablet Commonly known as: NORCO/VICODIN Take 1 tablet by mouth every 4 (four) hours as needed for severe pain.   insulin glargine 100 UNIT/ML injection Commonly known as: LANTUS Inject 0.12 mLs (12 Units total) into the skin daily.   lisinopril 2.5 MG tablet Commonly known as: ZESTRIL Take 1 tablet (2.5 mg total) by mouth daily.   metoprolol tartrate 25 MG tablet Commonly known as: LOPRESSOR Take 0.5 tablets (12.5 mg total) by mouth 2 (two) times daily.   simvastatin 20 MG tablet Commonly known as: ZOCOR Take 1 tablet (20 mg total) by mouth at bedtime.   Tyler Aas FlexTouch 100 UNIT/ML Sopn FlexTouch Pen Generic drug: insulin degludec Inject 16 Units into the skin daily.            Home Infusion Instuctions  (From admission, onward)         Start     Ordered   05/05/19 0000  Home infusion instructions [facility] May follow [place] Dosing Protocol; May administer Cathflo as needed to maintain patency of vascular access device.; Flushing of vascular access device: per [facility] Protocol: 0.9% NaCl pre/post medica.Marland KitchenMarland Kitchen  Question Answer Comment  Instructions May follow [place] Dosing Protocol   Instructions May administer Cathflo as needed to maintain patency of vascular access device.   Instructions Flushing of vascular access device: per [facility] Protocol: 0.9% NaCl pre/post medication administration and prn patency; Heparin 100 u/ml, 1m for implanted ports and Heparin 10u/ml, 5226mfor all other central venous catheters.   Instructions May follow [facility] Anaphylaxis Protocol for First Dose Administration in the home: 0.9% NaCl at 25-50 ml/hr  to maintain IV access for protocol meds. Epinephrine 0.3 ml IV/IM PRN and Benadryl 25-50 IV/IM PRN s/s of anaphylaxis.   Instructions [facility] Infusion Coordinator (RN) to assist per patient IV care needs in the home PRN.      05/05/19 1529          Disposition and follow-up:   [name]  At the hospital follow up visit please address:  1.  Discitis/osteomyelitis: Patient with discitis/osteomyelitis at L3-L4,L4-5 and L5-S1 concerning for discitis/ osteomyelitis. Patient was on vancomycin and cefepime. He has remained afebrile and discharged to home with home health with IV ceftriaxone for 6 weeks to end on 12/21. He will follow up with ID in 3 weeks.   Hallucinations: Patient presented with complaints of visual and sensory hallucinations secondary to his zoloft and wellbutrin. These medications were discontinued during his stay resulting in resolution of symptoms. Please make sure he is continued off the medications.  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 Bilateral hydronephrosis noted on CT but patient refused urethral and suprapubic catheterization during admission. Renal function remained stable. Patient will need urology follow up for further discussion of catheterization to relieve retention.   2.  Labs / imaging needed at time of follow-up: CBC, BMP  3.  Pending labs/ test needing follow-up: none   Follow-up Appointments: Follow-up Information    [facility] Follow up.   Contact information: [address] [place] [phone]     CoThayer HeadingsMD. Schedule an appointment as soon as possible for a visit in 4 week(s).   Specialty: Infectious Diseases Contact information:  [phone]        MiAlroy DustL.DeMarlou SaMD. Schedule an appointment as soon as possible for a visit in 1 week(s).   Specialty: Family Medicine Contact information: [address] [place] [phone]        [facility]. Schedule an appointment as soon as possible for a visit in 2 week(s).   Contact information: [address] [phone]        Hospital Course by problem list: 1. Discitis/osteomyelitis of L3-S1:  Patient with history of compression fracture of L3 on August 19th following a mechanical fall. Neurosurgery consulted who recommended for TLSO brace which patient has been wearing since discharge. On this admission, patient was wearing TLSO brace on presentation. He had back pain that is unchanged from prior. He remained afebrile throughout the admission. On CT Abdomen/Pelvis, concerns for discitis vs osteomyelitis that were confirmed with MRI of lumbar spine consistent with osteomyelitis of L3-4, L4-5, and L5-S1. Also significant for 26m41mhlegmon spanning L3-5. This was subsequently drained via IR guided aspiration and biopsy resulting  in 14m thin cloudy fluid. Gram stain with rare WBC, no organisms seen and culture without any growth. Patient was treated with vancomycin and cefepime following the aspiration and was discharged with IV ceftriaxone per ID recommendations for 6 weeks with end date of 06/12/2019. He is to follow with ID in 3 weeks for follow up.   2. Hallucinations:  Patient initially presented with sensory and visual hallucinations for one week duration that were worsening. He had recently been started on zoloft and wellbutrin 4-5 weeks prior to the hallucinations for agitation. The medications were discontinued during this admission and patient was observed during his stay. This resulted in resolution of symptoms. His mood and  behavior remained appropriate.   3. Urinary retention 2/2 urethral stricture with overflow incontinence:  Patient with history of lichen sclerosis with urethral stricture and several procedures in the past for correction. However, he continued to have urinary retention with overflow incontinence. He has had multiple UTIs in the past. During this admission, patient continued to have significant urinary retention with bladder distension to 3cm above navel. Patient was evaluated by urologist. He declined foley catheter or suprapubic catheter on admission. He continued to have overflow incontinence and renal function remained stable. However, he will need to follow up with urology for bladder decompression with foley or suprapubic catheter placement.   Discharge Vitals:   BP (!) 159/52 (BP Location: Left Arm)   Pulse 80   Temp 98 F (36.7 C) (Oral)   Resp 17   Ht _0  (1.753 m)   Wt 71.5 kg   SpO2 98%   BMI 23.28 kg/m   Pertinent Labs, Studies, and Procedures:  CBC Latest Ref Rng & Units 05/06/2019 05/04/2019 05/03/2019  WBC 4.0 - 10.5 K/uL 16.1(H) 14.4(H) 14.8(H)  Hemoglobin 13.0 - 17.0 g/dL 13.2 12.9(L) 12.5(L)  Hematocrit 39.0 - 52.0 % 40.5 40.1 38.2(L)  Platelets 150 - 400 K/uL 259 246 250   BMP Latest Ref Rng & Units 05/06/2019 05/04/2019 05/03/2019  Glucose 70 - 99 mg/dL 134(H) 207(H) 127(H)  BUN 8 - 23 mg/dL _1 Creatinine 0.61 - 1.24 mg/dL 0.70 0.76 0.75  Sodium 135 - 145 mmol/L 136 137 135  Potassium 3.5 - 5.1 mmol/L 3.8 4.1 4.0  Chloride 98 - 111 mmol/L 99 103 102  CO2 22 - 32 mmol/L _2 Calcium 8.9 - 10.3 mg/dL 8.4(L) 8.4(L) 7.9(L)   CT HEAD WO CONTRAST 04/30/2019: IMPRESSION: 1. No evidence of acute intracranial abnormality. 2. Moderate to advanced chronic small vessel ischemic disease has progressed from prior MRI 06/25/2017. Redemonstrated chronic left cerebellar infarct. 3. Moderate generalized parenchymal atrophy.   CXR 04/30/2019:  IMPRESSION: Low  lung volumes.  No acute cardiopulmonary process.  CT ABDOMEN/PELVIS W CONTRAST 04/30/2019:  IMPRESSION: 1. Interval loss of the inferior endplate of L3 and superior endplate of L4 with associated soft tissue stranding and loss of disc space at this level. Patient has history of prior inferior L3 endplate fracture 040/01/6760 While this may represent interval changes from prior trauma, overall findings are concerning for the possibility of discitis osteomyelitis at this level. This needs dedicated evaluation with lumbar spine MRI. 2. Interval development of bilateral moderate hydronephrosis. This is indeterminate in etiology however likely secondary to associated urinary bladder distension. Consider urinary bladder decompression and follow-up renal ultrasound.  MR LUMBAR SPINE W WO CONTRAST 04/30/2019: IMPRESSION: Transitional lumbosacral anatomy with spinal numbering as described. Findings consistent with L3-L4 and L4-L5 disc discitis/osteomyelitis as detailed. Surrounding  paraspinal soft tissue phlegmon extending into the neural foramina at these levels. Epidural phlegmon spanning the L3-L5 levels measuring up to 5 mm in AP dimension. Findings equivocal for discitis/osteomyelitis at the L5-S1 level. Sequela of discitis/osteomyelitis and superimposed spondylosis contribute to severe L3-L4 spinal canal stenosis Lumbar spondylosis at the remaining levels has not significantly changed since MRI 02/23/2019. Chronic L1 superior endplate height loss has slightly progressed.  IR LUMBAR DISC IMAGE GUIDED ASPIRATION 05/02/2019:  2 mL thin cloudy fluid aspirated, sent for requested laboratory studies.  COMPLICATIONS: None immediate.  IMPRESSION: Technically successful lumbar L3-4 disc aspiration under Fluoroscopy.  Aerobic/Anaerobic Culture: Wound L3 L4 Disc Aspirate: Gram Stain RARE WBC PRESENT, PREDOMINANTLY PMN  NO ORGANISMS SEEN    Culture CULTURE REINCUBATED FOR BETTER GROWTH       Discharge Instructions: Discharge Instructions    Call MD for:  difficulty breathing, headache or visual disturbances   Complete by: As directed    Call MD for:  extreme fatigue   Complete by: As directed    Call MD for:  hives   Complete by: As directed    Call MD for:  persistant dizziness or light-headedness   Complete by: As directed    Call MD for:  persistant nausea and vomiting   Complete by: As directed    Call MD for:  redness, tenderness, or signs of infection (pain, swelling, redness, odor or green/yellow discharge around incision site)   Complete by: As directed    Call MD for:  severe uncontrolled pain   Complete by: As directed    Call MD for:  temperature >100.4   Complete by: As directed    Diet - low sodium heart healthy   Complete by: As directed    Diet - low sodium heart healthy   Complete by: As directed    Discharge instructions   Complete by: As directed    [name],   It was a pleasure taking care of you in the hospital.  You were admitted because of back pain and we found out that he had an infection at the bone in your back.  We treated you with IV antibiotics and will discharge you to finish a 6-week course of IV antibiotics.  For your bladder, please follow-up with the urologist.  Take care!   Home infusion instructions [facility] May follow [place] Dosing Protocol; May administer Cathflo as needed to maintain patency of vascular access device.; Flushing of vascular access device: per [facility] Protocol: 0.9% NaCl pre/post medica...   Complete by: As directed    Instructions: May follow [place] Dosing Protocol   Instructions: May administer Cathflo as needed to maintain patency of vascular access device.   Instructions: Flushing of vascular access device: per [facility] Protocol: 0.9% NaCl pre/post medication administration and prn patency; Heparin 100 u/ml, 13m for implanted ports and Heparin 10u/ml, 522mfor all other central venous  catheters.   Instructions: May follow [facility] Anaphylaxis Protocol for First Dose Administration in the home: 0.9% NaCl at 25-50 ml/hr to maintain IV access for protocol meds. Epinephrine 0.3 ml IV/IM PRN and Benadryl 25-50 IV/IM PRN s/s of anaphylaxis.   Instructions: [facility] Coord[facility] (RN) to assist per patient IV care needs in the home PRN.   Increase activity slowly   Complete by: As directed    Increase activity slowly   Complete by: As directed       Signed: AsHarvie HeckMD  Internal Medicine, PGY-1 05/08/2019,  3:18 PM   Pager: [phone]

## 2019-05-05 NOTE — Consult Note (Signed)
[facility] for Infectious Disease    Date of Admission:  04/30/2019   Total days of antibiotics 4         Day 1 of ceftriaxone        Day 4 of vancomycin         Received 3 days of Cefepime       Reason for Consult: Discitis/osteomylitis     Referring Provider: Dr. Harvie Heck Primary Care Provider: Dr. Donnie Coffin  Assessment: Discitis/osteomylitis - Patients is afebrile and normotensive without any longstanding lines or tubes. He admits to non-changing back pain since his lumbar compression fractures earlier this year. He does have a leukocytosis of 14.4, and his risk factors for infection include diabetes, bladder outlet obstruction, and previous lumbar compression fractures. He is negative for HIV. 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 Patients blood cultures are negative for bacteremia and lumbar wound culture only shows rare WBCs without bacteria. Patient has no history of MRSA infections, but has a previous urine culture positive for amp-sensitive Enterococcus fecalis. This patient has a low-intermediate risk of bacterial discitis/osteomylitis at this time. But considering his results are non-definitive, we recommend treating the patient with IV cefazolin for 6-8 weeks.  We will arrange follow up in the ID clinic with Dr. Linus Salmons.  Plan: 1. Switch patient to IV cefazolin and continue IV therapy for 6 to 8 weeks at home.  2. Place PICC line 3. Outpatient follow up with ID in 6 weeks.   Principal Problem:   Discitis of lumbosacral region Active Problems:   A-fib ([place])   Diabetes mellitus ([place])   Urinary retention   Closed compression fracture of L3 lumbar vertebra, initial encounter (HCC)   Pressure injury of skin   Malnutrition of moderate degree   . amLODipine  5 mg Oral BID  . aspirin EC  81 mg Oral Daily  . clopidogrel  75 mg Oral Daily  . insulin aspart  0-9 Units Subcutaneous TID WC  . insulin glargine  10 Units  Subcutaneous QHS  . lactose free nutrition  237 mL Oral TID WC  . metoprolol tartrate  12.5 mg Oral BID  . multivitamin with minerals  1 tablet Oral Daily  . ondansetron  4 mg Intravenous Once  . simvastatin  20 mg Oral QHS  . tamsulosin  0.4 mg Oral QPC supper    HPI: [name] is a 78 y.o. male with a pertinent past medical history of depression, diabetes, atrial fibrillation, who presented to [facility] on 11/08 with altered mental status secondary to centrally acting medications with bilateral hydronephrosis secondary to urethral stricture with chronic asymptomatic bacteriuria. Incidental finding of possible lumbar osteomyelitis/discitis on CT/MR imaging at L3/L5 with possible phlegmon spanning L3/5, up to 5 mm in size.  Patient did have a leukocytosis of 16.8 with a left shift but afebrile dynamically stable. Patient does have a history of lumbar compression fracture at L1 and L3.  IR guided aspiration of phlegmon was obtained on 11/10 which showed rare WBCs, mostly PMNs.  Without organisms and empiric antibiotics were started with vancomycin and cefepime.  On evaluation today, the patient continues to have back pain, but denies any additional complaints. His leukocytosis has improved and is currently 14.4.  Recent blood cultures showed no growth at 2 days.  Tissue cultures continues to show no growth.    Review of Systems: Review of Systems  Constitutional: Negative for chills, fever and malaise/fatigue.  Respiratory: Negative for cough.   Cardiovascular: Negative for chest pain.  Gastrointestinal: Negative for abdominal pain, nausea and vomiting.  Musculoskeletal: Positive for back pain. Negative for joint pain and myalgias.  Skin: Negative for itching and rash.  Neurological: Positive for weakness.    Past Medical History:  Diagnosis Date  . A-fib ([place])   . B-cell lymphoma ([place]) 10/09/2011  . B-cell lymphoma ([place])   . Cataract   . Charcot-Marie-Tooth disease   . Diabetes  mellitus   . Hypertension   . 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 SIRS (systemic inflammatory response syndrome) ([place]) 09/2015  . Stroke ([place])   . Weakness     Social History   Tobacco Use  . Smoking status: Former Smoker    Packs/day: 2.00    Years: 35.00    Pack years: 70.00    Quit date: 06/03/1991    Years since quitting: 27.9  . Smokeless tobacco: Former Systems developer    Quit date: 06/26/1990  Substance Use Topics  . Alcohol use: No  . Drug use: No    Family History  Problem Relation Age of Onset  . Hypertension Mother   . Hypertension Father    Allergies  Allergen Reactions  . Prevnar [Pneumococcal 13-Val Conj Vacc] Anaphylaxis  . Actos [Pioglitazone Hydrochloride] Other (See Comments)    Afib  . Bupropion Other (See Comments)    Pt reports of hallucination   . Hydrochlorothiazide Other (See Comments)    Lower potassium to low  . Sertraline Other (See Comments)    Pt reports of hallucination   . Penicillins Rash    Tolerated cefepime, ceftriaxone  Did it involve swelling of the face/tongue/throat, SOB, or low BP? No Did it involve sudden or severe rash/hives, skin peeling, or any reaction on the inside of your mouth or nose?Yes Did you need to seek medical attention at a hospital or doctor's office. Yes When did it last happen?78 years old If all above answers are "NO", may proceed with cephalosporin use.    OBJECTIVE: Blood pressure 140/61, pulse 70, temperature 97.8 F (36.6 C), temperature source Oral, resp. rate 18, height 5\' 9"  (1.753 m), weight 71.5 kg, SpO2 98 %.  Physical Exam Constitutional:      General: He is not in acute distress.    Appearance: Normal appearance. He is not ill-appearing.  Eyes:     Extraocular Movements: Extraocular movements intact.  Neck:     Musculoskeletal: Normal range of motion.  Cardiovascular:     Rate and Rhythm: Normal rate.     Pulses: Normal pulses.     Heart  sounds: No murmur. No friction rub. No gallop.   Pulmonary:     Effort: Pulmonary effort is normal. No respiratory distress.     Breath sounds: Normal breath sounds.  Chest:     Chest wall: No tenderness.  Abdominal:     General: Abdomen is flat. There is no distension.  Musculoskeletal: Normal range of motion.        General: Tenderness (back pain) present. No swelling.  Skin:    General: Skin is warm and dry.  Neurological:     Mental Status: He is alert and oriented to person, place, and time.     Lab Results Lab Results  Component Value Date   WBC 14.4 (H) 05/04/2019   HGB 12.9 (L) 05/04/2019   HCT  40.1 05/04/2019   MCV 88.9 05/04/2019   PLT 246 05/04/2019    Lab Results  Component Value Date   CREATININE 0.76 05/04/2019   BUN 17 05/04/2019   NA 137 05/04/2019   K 4.1 05/04/2019   CL 103 05/04/2019   CO2 25 05/04/2019    Lab Results  Component Value Date   ALT 36 04/30/2019   AST 28 04/30/2019   ALKPHOS 90 04/30/2019   BILITOT 0.6 04/30/2019     Microbiology: Recent Results (from the past 240 hour(s))  Urine culture     Status: Abnormal   Collection Time: 04/30/19  7:47 AM   Specimen: Urine, Random  Result Value Ref Range Status   Specimen Description URINE, RANDOM  Final   Special Requests   Final    NONE Performed at [facility], [place] [address], [place], [place] [address]    Culture 50,000 COLONIES/mL YEAST (A)  Final   Report Status 05/02/2019 FINAL  Final  SARS CORONAVIRUS 2 (TAT 6-24 HRS) Nasopharyngeal Nasopharyngeal Swab     Status: None   Collection Time: 04/30/19 11:20 AM   Specimen: Nasopharyngeal Swab  Result Value Ref Range Status   SARS Coronavirus 2 NEGATIVE NEGATIVE Final    Comment: (NOTE) SARS-CoV-2 target nucleic acids are NOT DETECTED. The SARS-CoV-2 RNA is generally detectable in upper and lower respiratory specimens during the acute phase of infection. Negative results do not preclude SARS-CoV-2 infection, do not rule  out co-infections with other pathogens, and should not be used as the sole basis for treatment or other patient management decisions. Negative results must be combined with clinical observations, patient history, and epidemiological information. The expected result is Negative. Fact Sheet for Patients: SugarRoll.be Fact Sheet for Healthcare Providers: https://www.woods-mathews.com/ This test is not yet approved or cleared by the [place] FDA and  has been authorized for detection and/or diagnosis of SARS-CoV-2 by FDA under an Emergency Use Authorization (EUA). This EUA will remain  in effect (meaning this test can be used) for the duration of the COVID-19 declaration under Section 56 4(b)(1) of the Act, 21 U.S.C. section 360bbb-3(b)(1), unless the authorization is terminated or revoked sooner. Performed at [facility], [place] [address], [place], [place] [address]   Aerobic/Anaerobic Culture (surgical/deep wound)     Status: None (Preliminary result)   Collection Time: 05/02/19  9:01 AM   Specimen: Wound; Tissue  Result Value Ref Range Status   Specimen Description WOUND L3 L4 DISC ASPIRATE  Final   Special Requests Normal  Final   Gram Stain   Final    RARE WBC PRESENT, PREDOMINANTLY PMN NO ORGANISMS SEEN    Culture   Final    NO GROWTH 2 DAYS NO ANAEROBES ISOLATED; CULTURE IN PROGRESS FOR 5 DAYS Performed at [facility] [address], [place], [place] [address]    Report Status PENDING  Incomplete  Culture, blood (routine x 2)     Status: None (Preliminary result)   Collection Time: 05/02/19 10:30 AM   Specimen: BLOOD  Result Value Ref Range Status   Specimen Description BLOOD RIGHT ANTECUBITAL  Final   Special Requests   Final    BOTTLES DRAWN AEROBIC ONLY Blood Culture results may not be optimal due to an inadequate volume of blood received in culture bottles   Culture   Final    NO GROWTH 2 DAYS Performed at  [facility], [place] [address] [place],  [address]  Report Status PENDING  Incomplete  Culture, blood (routine x 2)     Status: None (Preliminary result)   Collection Time: 05/02/19 10:35 AM   Specimen: BLOOD  Result Value Ref Range Status   Specimen Description BLOOD RIGHT ANTECUBITAL  Final   Special Requests   Final    BOTTLES DRAWN AEROBIC ONLY Blood Culture results may not be optimal due to an inadequate volume of blood received in culture bottles   Culture   Final    NO GROWTH 2 DAYS Performed at [facility], [place] [address], [place], [place] [address]    Report Status PENDING  Incomplete    Marianna Payment, D.O. Date 05/05/2019 Time 12:41 PM [facility] Internal Medicine, PGY-1 Pager: [phone]   05/05/2019 11:15 AM

## 2019-05-05 NOTE — TOC Progression Note (Signed)
Transition of Care [facility]) - Progression Note    Patient Details  Name: [name] MRN: [id number] Date of Birth: 01-Oct-1940  Transition [facility]) CM/SW Contact  Bartholomew Crews, RN Phone Number: [phone] 05/05/2019, 3:42 PM  Clinical Narrative:    Anticipate patient transitioning home tomorrow 11/24. Patient is pending PICC placement for IV antibiotics at home. [facility] has met with daughter to do teaching. [facility] made aware of probable transition home tomorrow. Patient will need home health orders for RN, PT, OT, Aide, and SW with face to face.   Will need DME order for hoyer lift. Notified [facility] to follow up for delivery. Patient will need PTAR for transport home. TOC following for transitions.    Expected Discharge Plan: [place] Barriers to Discharge: Continued Medical Work up  Expected Discharge Plan and Services Expected Discharge Plan: [place] In-house Referral: NA Discharge Planning Services: CM Consult Post Acute Care Choice: [place] arrangements for the past 2 months: Single Family Home                 DME Arranged: N/A DME Agency: NA       HH Arranged: PT, RN [place] Agency: [place] ([facility]) Date HH Agency Contacted: 05/02/19 Time [place]: [place] Representative spoke with at [place]: [place] ([place]) Interventions    Readmission Risk Interventions Readmission Risk Prevention Plan 03/23/2019  Transportation Screening Complete  PCP or Specialist Appt within 3-5 Days Not Complete  Not Complete comments SNF placement planned  Some recent data might be hidden

## 2019-05-05 NOTE — Progress Notes (Addendum)
   Subjective: [name] was evaluated at bedside this morning. Patient states that he has been able to work with PT and sit on the edge of the bed. He continues to urinate without any symptoms.   Objective:  Vital signs in last 24 hours: Vitals:   05/04/19 0902 05/04/19 1629 05/04/19 2038 05/05/19 0545  BP: (!) 153/57 (!) 150/59 (!) 152/76 (!) 150/65  Pulse: 70 (!) 57 77 61  Resp: 18 18 18 18   Temp: 97.6 F (36.4 C) 98.4 F (36.9 C) 98 F (36.7 C)   TempSrc: Oral Oral    SpO2: 99% 97% 99% 98%  Weight:   71.5 kg   Height:       Physical Exam Vitals signs and nursing note reviewed.  Constitutional:      General: He is not in acute distress.    Appearance: Normal appearance. He is not ill-appearing.  Abdominal:     General: Bowel sounds are normal. There is distension.     Tenderness: There is no abdominal tenderness. There is no guarding or rebound.     Comments: No tenderness to palpation of abdomen. Bladder remains significantly distended  Neurological:     Mental Status: He is alert and oriented to person, place, and time. Mental status is at baseline.    Assessment/Plan:  [name] is a 78 year old male with PMHx of DLBCL in remission, A.fib, HTN, DM, and prior CVA presenting with one week of intermittent hallucinations, chronic constipation with one day of nausea/vomiting, and chronic urinary retention w/overflow incontinence. Labs significant for neutrophil predominant leukocytosis. UA consistent with sterile pyuria. CT Abdomen and MRI lumbar spine concerning for discitis/osteomyelitis at L3-L4 and L4-5 with phlegmon spanning L3-5 and measuring up to 86mm s/p aspiration without any growth on cultures.   Discitis vs Osteomyletitis: Patient with history of lumbar fracture after a fall on 8/19. 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 Gram stain positive for rare WBC, mostly PMNs without any organisms and cultures negative to date. He is  on empiric antibiotics.  Although he has been working with PT here to regain some strength. However, he still remains severely deconditioned and largely bed bound. Prior to admission, patient was dependent on family for ADLs. Patient will likely need SNF level care but I will discuss this with patient and daughter at bedside in regards to discharge planning.  - Continue IV vancomycin and cefepime inpatient with plans to continue IV cefazolin 4-6 weeks on discharge  - PICC line today  - CBC daily   Chronic asymptomatic bacteruria:  Patient with chronic urinary retention with overflow incontinence 2/2 urethral stricture.He continues to have overflow incontinence today. Renal function stable during this admission.  - Continue to monitor with daily BMP   Dispo: Anticipated discharge pending clinical improvement.   [name] Heck, MD  Internal Medicine, PGY-1 05/05/2019, 7:46 AM Pager: [phone]

## 2019-05-05 NOTE — Progress Notes (Signed)
Physical Therapy Treatment Patient Details Name: [name] MRN: [id number] DOB: September 20, 1940 Today's Date: 05/05/2019    History of Present Illness 78 y.o. 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 CT abdomen concerning for possible discitis osteomyelitis of prior L3-L4 fracture.    PT Comments    Pt agreeable to exercises and sitting EOB. He is hoping to go home any day.  Pt did exercises - working on UE and LE extension to help with function.  Pt did some core strengthening and educated on importance of this.  Back pain today 2-5/10.  Pt with improved sitting EOB and practicing scooting - he may do well with sliding board at home.  Educated pt on importance of not sliding heels on bed during exercises - but picking them up.  Pt would benefit from [facility] prior to home to help with skin integrity at home - nursing aware of request. PT will continue to work with pt while in hospital.   Follow Up Recommendations  Home health PT;Supervision/Assistance - 24 hour     Equipment Recommendations  None recommended by PT    Recommendations for Other Services       Precautions / Restrictions Precautions Precautions: Fall;Back Precaution Comments: Reviewed back precautions for comfort throughout functional mobility.  Required Braces or Orthoses: Spinal Brace Spinal Brace: Lumbar corset;Applied in sitting position Restrictions Weight Bearing Restrictions: No Other Position/Activity Restrictions: No order in chart for back brace    Mobility  Bed Mobility Overal bed mobility: Needs Assistance Bed Mobility: Rolling;Sidelying to Sit;Sit to Sidelying Rolling: Min assist Sidelying to sit: Min assist;Mod assist     Sit to sidelying: Mod  assist General bed mobility comments: pt doesnt have rail at home - educated him on using RW for unstable but present support.  Pt used both UEs to push up - needed min to mod assist to get trunk upright  Transfers                 General transfer comment: Pt didnt want to get OOB.  Pt practiced scooting forward on the bed. He then practiced moving feet and scooting up higher in bed before laying down.  Pt kept both hands on bed for support for his back but able to do 6 scoots before arms tired and back hurting more (5/10) and requesting to lay back down.  Ambulation/Gait             General Gait Details: unable   Stairs             Wheelchair Mobility    Modified Rankin (Stroke Patients Only)       Balance Overall balance assessment: Needs assistance Sitting-balance support: Bilateral upper extremity supported;Feet supported Sitting balance-Leahy Scale: Fair Sitting balance - Comments: educated pt on sitting - having feet on floor and under knees. pt using UEs on bed to help decrease some pressure in back in sitting.  Brace donned in sitting                                    Cognition Arousal/Alertness: Awake/alert Behavior During Therapy: WFL for tasks assessed/performed Overall Cognitive Status: Within Functional Limits for tasks assessed  General Comments: pt cooperative with treatment today      Exercises Other Exercises Other Exercises: pt supine - SAQ/SLR x 20 reps each leg Other Exercises: supine - shoulder flexion x 20 both arms Other Exercises: supine - hip abd/add - not sliding heels but keeping each leg in teh air.  then heel slides - without sliding his feet - 10 each leg - this was hard as used to sliding Other Exercises: supine - tricep presses - 20 both arms Other Exercises: supine - hooklying - core stabilty - keeping one leg still and moving other one 20 times    General  Comments General comments (skin integrity, edema, etc.): pt with foam on heels.  Pt needs PRAFOs to help prevent skin breakdown.  today did exercises with big emphasis on lifting legs tomove them instead of sliding them. P t has habit of sliding feet      Pertinent Vitals/Pain Pain Assessment: Faces(pt a 2 when he first sat and progressed to 5 before laying down) Pain Location: back  Pain Descriptors / Indicators: Aching;Discomfort;Nagging;Sore;Guarding Pain Intervention(s): Monitored during session;Limited activity within patient's tolerance    Home Living                      Prior Function            PT Goals (current goals can now be found in the care plan section) Progress towards PT goals: Progressing toward goals    Frequency    Min 3X/week      PT Plan Current plan remains appropriate    Co-evaluation              AM-PAC PT "6 Clicks" Mobility   Outcome Measure  Help needed turning from your back to your side while in a flat bed without using bedrails?: A Little Help needed moving from lying on your back to sitting on the side of a flat bed without using bedrails?: A Lot Help needed moving to and from a bed to a chair (including a wheelchair)?: Total Help needed standing up from a chair using your arms (e.g., wheelchair or bedside chair)?: Total Help needed to walk in hospital room?: Total   6 Click Score: 8    End of Session   Activity Tolerance: Patient tolerated treatment well;Patient limited by pain;Patient limited by fatigue Patient left: in bed;with call bell/phone within reach;with nursing/sitter in room Nurse Communication: Mobility status;Precautions PT Visit Diagnosis: Other abnormalities of gait and mobility (R26.89);Pain;Adult, failure to thrive (R62.7)     Time: 0850-0930 PT Time Calculation (min) (ACUTE ONLY): 40 min  Charges:  $Therapeutic Exercise: 8-22 mins $Therapeutic Activity: 8-22 mins                     05/05/2019    Rande Lawman, PT    [name] 05/05/2019, 9:40 AM

## 2019-05-05 NOTE — Progress Notes (Signed)
Pharmacy Antibiotic Note  [name] is a 78 y.o. male admitted on 04/30/2019 with discitis/osteomyelitis.  Pharmacy has been consulted for cefazolin dosing.  Plan: Cefazolin 2gm IV q8h x 6-8 weeks per ID D/C Vancomycin D/C Ceftriaxone  Height: 5\' 9"  (175.3 cm) Weight: 157 lb 10.1 oz (71.5 kg) IBW/kg (Calculated) : 70.7  Temp (24hrs), Avg:98.1 F (36.7 C), Min:97.8 F (36.6 C), Max:98.4 F (36.9 C)  Recent Labs  Lab 04/30/19 0645 05/01/19 0520 05/02/19 0543 05/03/19 0415 05/04/19 0542  WBC 16.8* 12.8* 11.9* 14.8* 14.4*  CREATININE 0.89 0.85 0.80 0.75 0.76    Estimated Creatinine Clearance: 77.3 mL/min (by C-G formula based on SCr of 0.76 mg/dL).    Allergies  Allergen Reactions  . Prevnar [Pneumococcal 13-Val Conj Vacc] Anaphylaxis  . Actos [Pioglitazone Hydrochloride] Other (See Comments)    Afib  . Bupropion Other (See Comments)    Pt reports of hallucination   . Hydrochlorothiazide Other (See Comments)    Lower potassium to low  . Sertraline Other (See Comments)    Pt reports of hallucination   . Penicillins Rash    Tolerated cefepime, ceftriaxone  Did it involve swelling of the face/tongue/throat, SOB, or low BP? No Did it involve sudden or severe rash/hives, skin peeling, or any reaction on the inside of your mouth or nose?Yes Did you need to seek medical attention at a hospital or doctor's office. Yes When did it last happen?78 years old If all above answers are "NO", may proceed with cephalosporin use.   Mishika Flippen A. Levada Dy, PharmD, BCPS, FNKF Clinical Pharmacist [place] Please utilize Amion for appropriate phone number to reach the unit pharmacist ([place])   05/05/2019 1:08 PM

## 2019-05-06 DIAGNOSIS — M4624 Osteomyelitis of vertebra, thoracic region: Secondary | ICD-10-CM

## 2019-05-06 LAB — BASIC METABOLIC PANEL
Anion gap: 11 (ref 5–15)
BUN: 18 mg/dL (ref 8–23)
CO2: 26 mmol/L (ref 22–32)
Calcium: 8.4 mg/dL — ABNORMAL LOW (ref 8.9–10.3)
Chloride: 99 mmol/L (ref 98–111)
Creatinine, Ser: 0.7 mg/dL (ref 0.61–1.24)
GFR calc Af Amer: >60 mL/min (ref >60)
GFR calc non Af Amer: >60 mL/min (ref >60)
Glucose, Bld: 134 mg/dL — ABNORMAL HIGH (ref 70–99)
Potassium: 3.8 mmol/L (ref 3.5–5.1)
Sodium: 136 mmol/L (ref 135–145)

## 2019-05-06 LAB — CBC
HCT: 40.5 % (ref 39.0–52.0)
Hemoglobin: 13.2 g/dL (ref 13.0–17.0)
MCH: 28.8 pg (ref 26.0–34.0)
MCHC: 32.6 g/dL (ref 30.0–36.0)
MCV: 88.2 fL (ref 80.0–100.0)
Platelets: 259 10*3/uL (ref 150–400)
RBC: 4.59 MIL/uL (ref 4.22–5.81)
RDW: 13.7 % (ref 11.5–15.5)
WBC: 16.1 10*3/uL — ABNORMAL HIGH (ref 4.0–10.5)
nRBC: 0 % (ref 0.0–0.2)

## 2019-05-06 LAB — GLUCOSE, CAPILLARY
Glucose-Capillary: 126 mg/dL — ABNORMAL HIGH (ref 70–99)
Glucose-Capillary: 228 mg/dL — ABNORMAL HIGH (ref 70–99)
Glucose-Capillary: 211 mg/dL — ABNORMAL HIGH (ref 70–99)

## 2019-05-06 MED ORDER — CHLORHEXIDINE GLUCONATE CLOTH 2 % EX PADS
6.0000 | MEDICATED_PAD | Freq: Every day | CUTANEOUS | Status: DC
  Administered 2019-05-06: 6 via TOPICAL

## 2019-05-06 MED ORDER — SODIUM CHLORIDE 0.9% FLUSH: 10.0000 mL | Freq: Two times a day (BID) | INTRAVENOUS | Status: DC

## 2019-05-06 MED ORDER — SODIUM CHLORIDE 0.9% FLUSH: 10.0000 mL | INTRAVENOUS | Status: DC | PRN

## 2019-05-06 NOTE — Progress Notes (Signed)
[name] to be discharged Home per MD order. Discussed prescriptions and follow up appointments with the patient. Prescriptions given to patient; medication list explained in detail. Patient verbalized understanding.  Skin clean, dry and intact without evidence of skin break down, no evidence of skin tears noted. IV catheter (PICC) intact and taken for home IV Antibiotic therapy. Site without signs and symptoms of complications. Dressing and pressure applied. Pt denies pain at the site currently. No complaints noted.  Patient free of lines, drains, and wounds.   An After Visit Summary (AVS) was printed and given to the patient. PTAR transported patient home.   Suzy Bouchard, RN

## 2019-05-06 NOTE — Progress Notes (Signed)
Awaiting response from IV team regarding PICC line placement. Plan is for pt to DC after PICC line placed today.  Paulla Fore, RN, BSN.

## 2019-05-06 NOTE — Progress Notes (Signed)
Peripherally Inserted Central Catheter/Midline Placement  The IV Nurse has discussed with the patient and/or persons authorized to consent for the patient, the purpose of this procedure and the potential benefits and risks involved with this procedure.  The benefits include less needle sticks, lab draws from the catheter, and the patient may be discharged home with the catheter. Risks include, but not limited to, infection, bleeding, blood clot (thrombus formation), and puncture of an artery; nerve damage and irregular heartbeat and possibility to perform a PICC exchange if needed/ordered by physician.  Alternatives to this procedure were also discussed.  Bard Power PICC patient education guide, fact sheet on infection prevention and patient information card has been provided to patient /or left at bedside.    PICC/Midline Placement Documentation  PICC Single Lumen 05/06/19 PICC Left Brachial 41 cm 0 cm (Active)  Indication for Insertion or Continuance of Line Home intravenous therapies (PICC only) 05/06/19 1225  Exposed Catheter (cm) 0 cm 05/06/19 1225  Site Assessment Clean;Dry;Intact 05/06/19 1225  Line Status Flushed;Saline locked;Blood return noted 05/06/19 1225  Dressing Type Transparent 05/06/19 1225  Dressing Status Clean;Intact;Dry;Antimicrobial disc in place 05/06/19 [place] checked and tightened 05/06/19 1225  Line Adjustment (NICU/IV Team Only) No 05/06/19 1225  Dressing Intervention New dressing 05/06/19 1225  Dressing Change Due 05/13/19 05/06/19 1225       Rolena Infante 05/06/2019, 12:26 PM

## 2019-05-06 NOTE — Progress Notes (Signed)
Called daughter around 1400 to confirm DC of pt. This RN coordinated DC with the case manager, MD, [facility] and daughter. [facility] has been called.   Paulla Fore, RN, BSN.

## 2019-05-06 NOTE — Progress Notes (Signed)
Contacted Jason at [facility] for [facility] PT, OT, SW, aide, and RN referral. Sue Lush at [facility] for DME referral (hoyer lift). Contacted Pam at [facility] and made her aware that pt is going home today and his next dose of IV antibiotic is due tonight at 10 p.m. Prescription for Ancef faxed to [facility] at [phone] per Pam's request. Received confirmation that the fax went through. Arranged ambulance transportation through [facility].

## 2019-05-06 NOTE — Progress Notes (Signed)
Called Patient's daughter [name] and informed her that [name] is here to transport patient home.   [name]

## 2019-05-06 NOTE — Plan of Care (Signed)
  Problem: Education: Goal: Knowledge of General Education information will improve Description: Including pain rating scale, medication(s)/side effects and non-pharmacologic comfort measures Outcome: Adequate for Discharge   Problem: Health Behavior/Discharge Planning: Goal: Ability to manage health-related needs will improve Outcome: Adequate for Discharge   Problem: Clinical Measurements: Goal: Ability to maintain clinical measurements within normal limits will improve Outcome: Adequate for Discharge   Problem: Clinical Measurements: Goal: Will remain free from infection Outcome: Adequate for Discharge   Problem: Clinical Measurements: Goal: Diagnostic test results will improve Outcome: Adequate for Discharge   Problem: Clinical Measurements: Goal: Respiratory complications will improve Outcome: Adequate for Discharge   Problem: Clinical Measurements: Goal: Cardiovascular complication will be avoided Outcome: Adequate for Discharge   Problem: Activity: Goal: Risk for activity intolerance will decrease Outcome: Adequate for Discharge   Problem: Nutrition: Goal: Adequate nutrition will be maintained Outcome: Adequate for Discharge   Problem: Coping: Goal: Level of anxiety will decrease Outcome: Adequate for Discharge   Problem: Elimination: Goal: Will not experience complications related to bowel motility Outcome: Adequate for Discharge   Problem: Elimination: Goal: Will not experience complications related to urinary retention Outcome: Adequate for Discharge   Problem: Pain Managment: Goal: General experience of comfort will improve Outcome: Adequate for Discharge   Problem: Skin Integrity: Goal: Risk for impaired skin integrity will decrease Outcome: Adequate for Discharge

## 2019-05-06 NOTE — Plan of Care (Signed)
  Problem: Education: Goal: Knowledge of General Education information will improve Description Including pain rating scale, medication(s)/side effects and non-pharmacologic comfort measures Outcome: Progressing   

## 2019-05-06 NOTE — Progress Notes (Signed)
   Subjective: HD#6   Overnight: No acute overnight events reported.  Today, [name] states he is doing well and actually denies any back pain.  Unfortunately he was not able to get his PICC line placed yesterday.  He is looking forward to having the PICC line placed and discharged home.  Objective:  Vital signs in last 24 hours: Vitals:   05/05/19 0935 05/05/19 1620 05/05/19 2050 05/06/19 0618  BP: 140/61 (!) 143/47 (!) 148/57 (!) 154/48  Pulse: 70 72 78 (!) 56  Resp: 18 20 18 18   Temp: 97.8 F (36.6 C) 99.2 F (37.3 C) 97.9 F (36.6 C) 99 F (37.2 C)  TempSrc: Oral Oral  Oral  SpO2: 98% 98% 97% 97%  Weight:      Height:       Const: In no apparent distress, lying comfortably in bed, conversational Skin: Warm, no rashes   Assessment/Plan:  Principal Problem:   Discitis of lumbosacral region Active Problems:   A-fib (HCC)   Diabetes mellitus (HCC)   Urinary retention   Closed compression fracture of L3 lumbar vertebra, initial encounter (HCC)   Pressure injury of skin   Malnutrition of moderate degree  [name] is a 78 year old male with PMHx of DLBCL in remission, A.fib, HTN, DM, and prior CVA presenting with one week of intermittent hallucinations, chronic constipation with one day of nausea/vomiting, and chronic urinary retention w/overflow incontinence. Labs significant for neutrophil predominant leukocytosis. UA consistent with sterile pyuria. CT Abdomen and MRI lumbar spine concerning for discitis/osteomyelitis at L3-L4 and L4-5 with phlegmon spanning L3-5 and measuring up to 45mm s/p aspiration without any growth on cultures.   Discitis vs Osteomyletitis: Patient with history of lumbar fracture after a fall on 8/19. 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 Gram stain positive for rare WBC, mostly PMNs without any organisms and cultures negative to date.  - Transition to IV cefazolin 4-6 weeks on discharge    Dispo:  Anticipated discharge pending clinical improvement.   Jean Rosenthal, MD 05/06/2019, 6:54 AM Pager: [phone] Internal Medicine Teaching Service

## 2019-05-06 NOTE — Progress Notes (Signed)
Spoke with RN re PICC placement. Will be placed today.

## 2019-05-07 LAB — CULTURE, BLOOD (ROUTINE X 2)
Culture: NO GROWTH
Culture: NO GROWTH

## 2019-05-09 LAB — AEROBIC/ANAEROBIC CULTURE W GRAM STAIN (SURGICAL/DEEP WOUND): Special Requests: NORMAL

## 2019-06-01 ENCOUNTER — Encounter: Payer: Self-pay | Admitting: Internal Medicine

## 2019-06-01 ENCOUNTER — Ambulatory Visit: Payer: Medicare Other | Admitting: Internal Medicine

## 2019-06-01 ENCOUNTER — Telehealth: Payer: Self-pay

## 2019-06-01 ENCOUNTER — Other Ambulatory Visit: Payer: Self-pay

## 2019-06-01 VITALS — BP 116/70 | HR 73 | Temp 98.3°F

## 2019-06-01 DIAGNOSIS — E162 Hypoglycemia, unspecified: Secondary | ICD-10-CM

## 2019-06-01 DIAGNOSIS — M4647 Discitis, unspecified, lumbosacral region: Secondary | ICD-10-CM | POA: Diagnosis not present

## 2019-06-01 DIAGNOSIS — R531 Weakness: Secondary | ICD-10-CM | POA: Diagnosis not present

## 2019-06-01 DIAGNOSIS — Z5181 Encounter for therapeutic drug level monitoring: Secondary | ICD-10-CM

## 2019-06-01 NOTE — Assessment & Plan Note (Signed)
Really seems to be improving and with bacterial growth from the aspiration, c/w discitis.  I will extend him to 8 weeks through January 5 and have him follow up with me then.  Will get the ESR and CRP in the meantime and if ok, plan to stop 1/5 vs oral continuation therapy.

## 2019-06-01 NOTE — Progress Notes (Signed)
   Subjective:    Patient ID: [name], male    DOB: 02-20-1941, 78 y.o.   MRN: [id number]  HPI Here for hsfu He was hospitalized in November for AMS secondary to mediations and had back pain for several months and an MRI was done c/w discitis and phlegmon.  Placed on empiric therapy after aspiration and the culture did grow Granulicatella.  Has been on cefazolin and tolerating well.  Back pain much improved and he is walking more.  No labs available to review at this time.  No associated rash or diarrhea.  Has had some low blood sugar and to contact his pcp about his DM medications.  Here with his daughter.    Review of Systems  Constitutional: Negative for fatigue and unexpected weight change.  Gastrointestinal: Negative for diarrhea and nausea.  Skin: Negative for rash.       Objective:   Physical Exam Constitutional:      Appearance: Normal appearance.  Eyes:     General: No scleral icterus. Cardiovascular:     Rate and Rhythm: Normal rate and regular rhythm.     Heart sounds: No murmur.  Pulmonary:     Effort: Pulmonary effort is normal.  Skin:    Findings: No rash.  Neurological:     Mental Status: He is alert.  Psychiatric:        Mood and Affect: Mood normal.   SH: no tobacco       Assessment & Plan:

## 2019-06-01 NOTE — Assessment & Plan Note (Signed)
I encouraged him to continue to work with PT and OT

## 2019-06-01 NOTE — Assessment & Plan Note (Signed)
His daughter will contact his PCP to see if he should reduce his DM medications

## 2019-06-01 NOTE — Telephone Encounter (Signed)
Per Dr. Linus Salmons called [facility] with verbal order to extend antibiotics. Spoke with Melissa who was able to take order to extend 2 more weeks. New end date is 06/27/19.  [place]

## 2019-06-01 NOTE — Assessment & Plan Note (Signed)
I have requestd a copy of the labs, BMP and CBC

## 2019-06-26 ENCOUNTER — Other Ambulatory Visit: Payer: Self-pay

## 2019-06-27 ENCOUNTER — Telehealth: Payer: Self-pay

## 2019-06-27 ENCOUNTER — Other Ambulatory Visit: Payer: Self-pay | Admitting: Internal Medicine

## 2019-06-27 ENCOUNTER — Telehealth: Payer: Self-pay | Admitting: *Deleted

## 2019-06-27 ENCOUNTER — Ambulatory Visit: Payer: Medicare Other | Admitting: Internal Medicine

## 2019-06-27 MED ORDER — CEPHALEXIN 500 MG PO CAPS: 500.0000 mg | ORAL_CAPSULE | Freq: Four times a day (QID) | ORAL | 0 refills | Status: DC

## 2019-06-27 NOTE — Telephone Encounter (Signed)
Was able to connect with patient's daughter, Thea Silversmith and confirmed that Keflex was sent to pharmacy for patient to take, and that home health will pull patient's PICC at their visit. Set up follow up appointment for 1 month (evisit). Daughter verbalized understanding and appreciated help setting things up.  Eliya Geiman Lorita Officer, RN

## 2019-06-27 NOTE — Telephone Encounter (Signed)
Patient's daughter left message in Triage upset about the transportation issues today, stating that the company did not send any one to actually help get her father into the vehicle.  She cannot move him herself, is not sure what to do. Per chart, Dorie spoke with her earlier today to relay medication changes and discuss follow up in 1 month (evisit). RN returned the call, asked her to call back if there were any further issues or concerns. Landis Gandy, RN

## 2019-06-27 NOTE — Telephone Encounter (Signed)
Reached out to patient's daughter, Thea Silversmith, to update her on Dr. Henreitta Leber plan. Dr. Linus Salmons ordered PICC to be pulled and will be sending a 1 month supply of Keflex to his pharmacy. He wishes to see patient in 1 month via evisit to check in. Asked Ms. Rich Reining to call office back for information.  Dreshawn Hendershott Lorita Officer, RN

## 2019-06-27 NOTE — Telephone Encounter (Signed)
Per Dr. Linus Salmons, ok to pull PICC. Relayed verbal order to [facility] at [facility]. Provider notified.   Kagan Hietpas Lorita Officer, RN

## 2019-06-29 ENCOUNTER — Telehealth: Payer: Self-pay | Admitting: Hospice

## 2019-06-29 NOTE — Telephone Encounter (Signed)
Phone call placed to patient to offer to schedule a visit with Palliative Care. Phone rang, with no answer I left a voicemail for call back. 

## 2019-06-30 ENCOUNTER — Telehealth: Payer: Self-pay

## 2019-06-30 ENCOUNTER — Telehealth: Payer: Self-pay | Admitting: Licensed Clinical Social Worker

## 2019-06-30 ENCOUNTER — Telehealth: Payer: Self-pay | Admitting: Hospice

## 2019-06-30 NOTE — Telephone Encounter (Signed)
Phone call placed to patient to offer to schedule a visit with Palliative Care. Phone rang, with no answer I left a voicemail for call back. 

## 2019-06-30 NOTE — Telephone Encounter (Signed)
SW left a vm to schedule a visit with patient and family.

## 2019-06-30 NOTE — Telephone Encounter (Signed)
Phone call returned to patient's daughter to schedule visit with Palliative care. Daughter shared that patient is refusing medical treatment and has refused hospice services. Daughter has expressed frustration and became tearful throughout conversation. Verbal consent obtained for Palliative Care. Visit scheduled via telehealth for 07/04/19 @ 9:30am

## 2019-07-03 ENCOUNTER — Telehealth: Payer: Self-pay | Admitting: Licensed Clinical Social Worker

## 2019-07-03 NOTE — Telephone Encounter (Signed)
Daughter, Thea Silversmith, returned SW phone call and left a vm after hours.  SW returned her call today and left a vm.

## 2019-07-04 ENCOUNTER — Other Ambulatory Visit: Payer: Medicare Other | Admitting: Hospice

## 2019-07-04 ENCOUNTER — Other Ambulatory Visit: Payer: Self-pay

## 2019-07-04 DIAGNOSIS — Z515 Encounter for palliative care: Secondary | ICD-10-CM

## 2019-07-04 NOTE — Progress Notes (Signed)
    [facility] Consult Note Telephone: [phone]  Fax: [phone]  PATIENT NAME: [name] DOB: [birth year] MRN: [id number]  PRIMARY CARE PROVIDER:   Alroy Dust, Carlean Jews.Marlou Sa, MD  REFERRING PROVIDER:  Alroy Dust, Carlean Jews.Marlou Sa, [place] [address] [employer] [address] [place],  [place] [address]  RESPONSIBLE PARTY:   Self, daughter Thea Silversmith  NOTE: This initial palliative consult did not hold because Thea Silversmith sent an email explaining that there were so many things happening at the same time in the family and today was not a good day. She  Requested possiblyThursday next week. Review of records showed no patient hospitalization or acute concerns.  Teodoro Spray, NP

## 2019-07-12 ENCOUNTER — Telehealth: Payer: Self-pay | Admitting: Hospice

## 2019-07-12 NOTE — Telephone Encounter (Signed)
Called patient's daughter to reschedule the Initial Consult, no answer - left message with reason for call along with my contact information.

## 2019-07-17 ENCOUNTER — Other Ambulatory Visit: Payer: Self-pay

## 2019-07-17 ENCOUNTER — Other Ambulatory Visit: Payer: Medicare Other | Admitting: Hospice

## 2019-07-17 DIAGNOSIS — Z515 Encounter for palliative care: Secondary | ICD-10-CM

## 2019-07-17 DIAGNOSIS — E1142 Type 2 diabetes mellitus with diabetic polyneuropathy: Secondary | ICD-10-CM

## 2019-07-17 NOTE — Progress Notes (Signed)
[facility] Consult Note Telephone: [phone]  Fax: [phone]  PATIENT NAME: [name] DOB: 28-Mar-1941 MRN: [id number]  PRIMARY CARE PROVIDER:   Alroy Dust, Carlean Jews.Marlou Sa, MD  REFERRING PROVIDER:  Alroy Dust, Carlean Jews.Marlou Sa, [place],  [place] [address]  RESPONSIBLE PARTY:   Self Contact: Daughter [name] [phone]  TELEHEALTH VISIT STATEMENT Due to the COVID-19 crisis, this visit was done via telephone from my office. It was initiated and consented to by this patient and/or family.  RECOMMENDATIONS/PLAN:   [facility] Care Planning/Goals of Care: Telehealth Visit consisted of building trust and discussions on Palliative Medicine as specialized medical care for people living with serious illness, aimed at facilitating better quality of life through symptoms relief, assisting with advance care plan and establishing goals of care. Patient refused Hospice services about 2 weeks ago. He refused Hospice services because he understood his medications will not be covered by Hospice, per [name]. Some explanation offered on Hospice coverage of medication and use of substitution. Patient affirmed he is a DNR. [name] requested a signed copy of DNR to be mailed; NP obliged. Signed DNR uploaded to Epic today. [name] explained patient refuses care, hallucinates occasionally, verbally abusive sometimes; she said patient has Dementia which is worsening.  Therapeutic listening and emotional support provided. Goals of care include to maximize quality of life and symptom management. Patient and [name] agreed on in-person visit 07/26/2019 to further discuss goals of care and symptom relief.  Symptom management: Patient with worsening weakness, now bed bound. Patient cancelled and refused PT/OT. Reduced urination r/t urethral strictures. While in the hospital patient declined urethral stretching and supra pubic cath. Patient continues on  Cephalexin 500mg  QID for Osteomyelitis. Patient with worsening cognition, more forgetful with confusion, PPS 30% Dementia FAST 6d. Patient denied pain/discomfort, no respiratory distress; in no acute distress.  Follow up: Palliative care will continue to follow patient for goals of care clarification and symptom management. I spent 60 minutes providing this consultation.  More than 50% of the time in this consultation was spent on coordinating communication HISTORY OF PRESENT ILLNESS:  [name] is a 79 y.o. year old male with multiple medical problems including Osteomyelitis, Lumber compression fracture, Afib HTN Type 2 DM. Palliative Care was asked to help address goals of care.   CODE STATUS: DNR  PPS: 30% HOSPICE ELIGIBILITY/DIAGNOSIS: TBD  PAST MEDICAL HISTORY:  Past Medical History:  Diagnosis Date  . A-fib ([facility])   . 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 B-cell lymphoma ([place])   . Cataract   . Charcot-Marie-Tooth disease   . Diabetes mellitus   . Hypertension   . 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 SIRS (systemic inflammatory response syndrome) ([facility]) 09/2015  . Stroke ([place])   . Weakness     SOCIAL HX:  Social History   Tobacco Use  . Smoking status: Former Smoker    Packs/day: 2.00    Years: 35.00    Pack years: 70.00    Quit date: 06/03/1991    Years since quitting: 28.1  . Smokeless tobacco: Former Systems developer    Quit date: 06/26/1990  Substance Use Topics  . Alcohol use: No    ALLERGIES:  Allergies  Allergen Reactions  . Prevnar [Pneumococcal 13-Val Conj Vacc] Anaphylaxis  . Actos [Pioglitazone Hydrochloride] Other (See Comments)    Afib  . Bupropion Other (See Comments)  Pt reports of hallucination   . Hydrochlorothiazide Other (See Comments)    Lower potassium to low  . Sertraline Other (See Comments)    Pt reports of hallucination   . Penicillins Rash    Tolerated cefepime, ceftriaxone  Did it involve swelling  of the face/tongue/throat, SOB, or low BP? No Did it involve sudden or severe rash/hives, skin peeling, or any reaction on the inside of your mouth or nose?Yes Did you need to seek medical attention at a hospital or doctor's office. Yes When did it last happen?79 years old If all above answers are "NO", may proceed with cephalosporin use.     PERTINENT MEDICATIONS:  Outpatient Encounter Medications as of 07/17/2019  Medication Sig  . acetaminophen (TYLENOL) 500 MG tablet Take 500 mg by mouth every 6 (six) hours as needed for mild pain.  [name] amLODipine (NORVASC) 5 MG tablet Take 1 tablet (5 mg total) by mouth 2 (two) times daily. (Patient taking differently: Take 5 mg by mouth daily. )  . aspirin EC 81 MG EC tablet Take 1 tablet (81 mg total) by mouth daily. (Patient taking differently: Take 81 mg by mouth every evening. )  . bisacodyl (DULCOLAX) 5 MG EC tablet Take 2 tablets (10 mg total) by mouth daily as needed for moderate constipation.  . cephALEXin (KEFLEX) 500 MG capsule Take 1 capsule (500 mg total) by mouth 4 (four) times daily.  . clopidogrel (PLAVIX) 75 MG tablet Take 75 mg by mouth daily.  [name] HYDROcodone-acetaminophen (NORCO/VICODIN) 5-325 MG tablet Take 1 tablet by mouth every 4 (four) hours as needed for severe pain.  [name] insulin degludec (TRESIBA FLEXTOUCH) 100 UNIT/ML SOPN FlexTouch Pen Inject 16 Units into the skin daily.  . insulin glargine (LANTUS) 100 UNIT/ML injection Inject 0.12 mLs (12 Units total) into the skin daily.  [name] lisinopril (ZESTRIL) 10 MG tablet Take 10 mg by mouth daily.  [name] lisinopril (ZESTRIL) 2.5 MG tablet Take 1 tablet (2.5 mg total) by mouth daily.  . metoprolol tartrate (LOPRESSOR) 25 MG tablet Take 0.5 tablets (12.5 mg total) by mouth 2 (two) times daily.  . repaglinide (PRANDIN) 0.5 MG tablet Take 0.5 mg by mouth daily.  . simvastatin (ZOCOR) 20 MG tablet Take 1 tablet (20 mg total) by mouth at bedtime.   No facility-administered encounter medications  on file as of 07/17/2019.    Teodoro Spray, NP

## 2019-07-26 ENCOUNTER — Other Ambulatory Visit: Payer: Self-pay

## 2019-07-26 ENCOUNTER — Ambulatory Visit (INDEPENDENT_AMBULATORY_CARE_PROVIDER_SITE_OTHER): Payer: Medicare Other | Admitting: Internal Medicine

## 2019-07-26 ENCOUNTER — Other Ambulatory Visit: Payer: Medicare Other | Admitting: Hospice

## 2019-07-26 ENCOUNTER — Encounter: Payer: Self-pay | Admitting: Internal Medicine

## 2019-07-26 DIAGNOSIS — M4647 Discitis, unspecified, lumbosacral region: Secondary | ICD-10-CM

## 2019-07-26 DIAGNOSIS — Z515 Encounter for palliative care: Secondary | ICD-10-CM

## 2019-07-26 MED ORDER — CEPHALEXIN 500 MG PO CAPS: 500.0000 mg | ORAL_CAPSULE | Freq: Four times a day (QID) | ORAL | 1 refills | Status: DC

## 2019-07-26 NOTE — Progress Notes (Signed)
I connected with  [name] on 07/26/19 by phone and verified that I am speaking with the correct person using two identifiers.   I discussed the limitations of evaluation and management by telemedicine. The patient expressed understanding and agreed to proceed.  HPI: he is stable and on Keflex 4 times per day.  His wife is also on the phone and gives some of the history.  He is having no further back pain but continues to have difficulty with walking.  He is being evaluated by palliative care services.  No loss of bowel or bladder function.   Last labs with elevated CRP and ESR.  Unable to get to appointments.    A/P - I will have him continue with oral keflex for 2 more months then stop.  Since he is not having pain, will then stop the medication and observe off of antibiotics after April.   If it worsens, will then consider new scan/MRI though difficult to get him to appointments.   Will schedule follow up in about 10 weeks or so The wife asked to call her directly at [phone] 25 minutes spent in discussion of above.
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
## 2019-08-31 ENCOUNTER — Telehealth: Payer: Self-pay | Admitting: Hospice

## 2019-08-31 NOTE — Telephone Encounter (Signed)
Called patient yesterday for scheduled telehealth visit and left voicemail. Called again today on all phone numbers on file and left voicemails with call back number

## 2019-09-11 ENCOUNTER — Encounter (HOSPITAL_COMMUNITY): Payer: Self-pay | Admitting: Emergency Medicine

## 2019-09-11 ENCOUNTER — Emergency Department (HOSPITAL_COMMUNITY): Payer: Medicare Other

## 2019-09-11 ENCOUNTER — Other Ambulatory Visit: Payer: Self-pay

## 2019-09-11 ENCOUNTER — Inpatient Hospital Stay (HOSPITAL_COMMUNITY)
Admission: EM | Admit: 2019-09-11 | Discharge: 2019-09-16 | DRG: 871 | Disposition: A | Discharge status: Hospice | Payer: Medicare Other | Attending: Student | Admitting: Student

## 2019-09-11 DIAGNOSIS — I480 Paroxysmal atrial fibrillation: Secondary | ICD-10-CM | POA: Diagnosis present

## 2019-09-11 DIAGNOSIS — Z792 Long term (current) use of antibiotics: Secondary | ICD-10-CM

## 2019-09-11 DIAGNOSIS — Z7982 Long term (current) use of aspirin: Secondary | ICD-10-CM

## 2019-09-11 DIAGNOSIS — E1165 Type 2 diabetes mellitus with hyperglycemia: Secondary | ICD-10-CM | POA: Diagnosis present

## 2019-09-11 DIAGNOSIS — I10 Essential (primary) hypertension: Secondary | ICD-10-CM | POA: Diagnosis present

## 2019-09-11 DIAGNOSIS — R7881 Bacteremia: Secondary | ICD-10-CM | POA: Diagnosis not present

## 2019-09-11 DIAGNOSIS — R1110 Vomiting, unspecified: Secondary | ICD-10-CM | POA: Diagnosis present

## 2019-09-11 DIAGNOSIS — Z7401 Bed confinement status: Secondary | ICD-10-CM | POA: Diagnosis not present

## 2019-09-11 DIAGNOSIS — M869 Osteomyelitis, unspecified: Secondary | ICD-10-CM

## 2019-09-11 DIAGNOSIS — N1330 Unspecified hydronephrosis: Secondary | ICD-10-CM | POA: Diagnosis not present

## 2019-09-11 DIAGNOSIS — F419 Anxiety disorder, unspecified: Secondary | ICD-10-CM | POA: Diagnosis present

## 2019-09-11 DIAGNOSIS — Z8673 Personal history of transient ischemic attack (TIA), and cerebral infarction without residual deficits: Secondary | ICD-10-CM

## 2019-09-11 DIAGNOSIS — A419 Sepsis, unspecified organism: Secondary | ICD-10-CM | POA: Diagnosis not present

## 2019-09-11 DIAGNOSIS — R6520 Severe sepsis without septic shock: Secondary | ICD-10-CM | POA: Diagnosis present

## 2019-09-11 DIAGNOSIS — Z20822 Contact with and (suspected) exposure to covid-19: Secondary | ICD-10-CM | POA: Diagnosis present

## 2019-09-11 DIAGNOSIS — T83511A Infection and inflammatory reaction due to indwelling urethral catheter, initial encounter: Secondary | ICD-10-CM | POA: Diagnosis not present

## 2019-09-11 DIAGNOSIS — Z9221 Personal history of antineoplastic chemotherapy: Secondary | ICD-10-CM

## 2019-09-11 DIAGNOSIS — Z79899 Other long term (current) drug therapy: Secondary | ICD-10-CM

## 2019-09-11 DIAGNOSIS — E872 Acidosis: Secondary | ICD-10-CM | POA: Diagnosis present

## 2019-09-11 DIAGNOSIS — C8510 Unspecified B-cell lymphoma, unspecified site: Secondary | ICD-10-CM | POA: Diagnosis present

## 2019-09-11 DIAGNOSIS — Z87891 Personal history of nicotine dependence: Secondary | ICD-10-CM

## 2019-09-11 DIAGNOSIS — Z7902 Long term (current) use of antithrombotics/antiplatelets: Secondary | ICD-10-CM

## 2019-09-11 DIAGNOSIS — R5381 Other malaise: Secondary | ICD-10-CM | POA: Diagnosis not present

## 2019-09-11 DIAGNOSIS — E43 Unspecified severe protein-calorie malnutrition: Secondary | ICD-10-CM | POA: Diagnosis present

## 2019-09-11 DIAGNOSIS — G8929 Other chronic pain: Secondary | ICD-10-CM | POA: Diagnosis present

## 2019-09-11 DIAGNOSIS — Z7189 Other specified counseling: Secondary | ICD-10-CM | POA: Diagnosis not present

## 2019-09-11 DIAGNOSIS — Z66 Do not resuscitate: Secondary | ICD-10-CM | POA: Diagnosis present

## 2019-09-11 DIAGNOSIS — E1142 Type 2 diabetes mellitus with diabetic polyneuropathy: Secondary | ICD-10-CM | POA: Diagnosis present

## 2019-09-11 DIAGNOSIS — Z923 Personal history of irradiation: Secondary | ICD-10-CM

## 2019-09-11 DIAGNOSIS — R778 Other specified abnormalities of plasma proteins: Secondary | ICD-10-CM | POA: Diagnosis not present

## 2019-09-11 DIAGNOSIS — N390 Urinary tract infection, site not specified: Secondary | ICD-10-CM | POA: Diagnosis not present

## 2019-09-11 DIAGNOSIS — I2510 Atherosclerotic heart disease of native coronary artery without angina pectoris: Secondary | ICD-10-CM | POA: Diagnosis not present

## 2019-09-11 DIAGNOSIS — Z8572 Personal history of non-Hodgkin lymphomas: Secondary | ICD-10-CM | POA: Diagnosis not present

## 2019-09-11 DIAGNOSIS — N400 Enlarged prostate without lower urinary tract symptoms: Secondary | ICD-10-CM | POA: Diagnosis present

## 2019-09-11 DIAGNOSIS — Z8249 Family history of ischemic heart disease and other diseases of the circulatory system: Secondary | ICD-10-CM | POA: Diagnosis not present

## 2019-09-11 DIAGNOSIS — Z794 Long term (current) use of insulin: Secondary | ICD-10-CM | POA: Diagnosis not present

## 2019-09-11 DIAGNOSIS — N136 Pyonephrosis: Secondary | ICD-10-CM | POA: Diagnosis present

## 2019-09-11 DIAGNOSIS — I4510 Unspecified right bundle-branch block: Secondary | ICD-10-CM | POA: Diagnosis present

## 2019-09-11 DIAGNOSIS — R338 Other retention of urine: Secondary | ICD-10-CM | POA: Diagnosis not present

## 2019-09-11 DIAGNOSIS — E1151 Type 2 diabetes mellitus with diabetic peripheral angiopathy without gangrene: Secondary | ICD-10-CM | POA: Diagnosis present

## 2019-09-11 DIAGNOSIS — A4159 Other Gram-negative sepsis: Secondary | ICD-10-CM | POA: Diagnosis present

## 2019-09-11 DIAGNOSIS — L89620 Pressure ulcer of left heel, unstageable: Secondary | ICD-10-CM | POA: Diagnosis present

## 2019-09-11 DIAGNOSIS — M4646 Discitis, unspecified, lumbar region: Secondary | ICD-10-CM | POA: Diagnosis not present

## 2019-09-11 DIAGNOSIS — Z789 Other specified health status: Secondary | ICD-10-CM | POA: Diagnosis not present

## 2019-09-11 DIAGNOSIS — C8520 Mediastinal (thymic) large B-cell lymphoma, unspecified site: Secondary | ICD-10-CM | POA: Diagnosis not present

## 2019-09-11 DIAGNOSIS — I248 Other forms of acute ischemic heart disease: Secondary | ICD-10-CM | POA: Diagnosis present

## 2019-09-11 DIAGNOSIS — Z515 Encounter for palliative care: Secondary | ICD-10-CM | POA: Diagnosis not present

## 2019-09-11 DIAGNOSIS — E876 Hypokalemia: Secondary | ICD-10-CM | POA: Diagnosis present

## 2019-09-11 DIAGNOSIS — R6521 Severe sepsis with septic shock: Secondary | ICD-10-CM | POA: Diagnosis not present

## 2019-09-11 DIAGNOSIS — F0390 Unspecified dementia without behavioral disturbance: Secondary | ICD-10-CM | POA: Diagnosis present

## 2019-09-11 DIAGNOSIS — M4626 Osteomyelitis of vertebra, lumbar region: Secondary | ICD-10-CM | POA: Diagnosis present

## 2019-09-11 DIAGNOSIS — Z6821 Body mass index (BMI) 21.0-21.9, adult: Secondary | ICD-10-CM

## 2019-09-11 LAB — CBC WITH DIFFERENTIAL/PLATELET
Abs Immature Granulocytes: 0.08 10*3/uL — ABNORMAL HIGH (ref 0.00–0.07)
Basophils Absolute: 0 10*3/uL (ref 0.0–0.1)
Basophils Relative: 0 %
Eosinophils Absolute: 0.1 10*3/uL (ref 0.0–0.5)
Eosinophils Relative: 0 %
HCT: 46 % (ref 39.0–52.0)
Hemoglobin: 14.3 g/dL (ref 13.0–17.0)
Immature Granulocytes: 1 %
Lymphocytes Relative: 6 %
Lymphs Abs: 0.8 10*3/uL (ref 0.7–4.0)
MCH: 26.7 pg (ref 26.0–34.0)
MCHC: 31.1 g/dL (ref 30.0–36.0)
MCV: 85.8 fL (ref 80.0–100.0)
Monocytes Absolute: 0.5 10*3/uL (ref 0.1–1.0)
Monocytes Relative: 4 %
Neutro Abs: 11.7 10*3/uL — ABNORMAL HIGH (ref 1.7–7.7)
Neutrophils Relative %: 89 %
Platelets: 252 10*3/uL (ref 150–400)
RBC: 5.36 MIL/uL (ref 4.22–5.81)
RDW: 15.7 % — ABNORMAL HIGH (ref 11.5–15.5)
WBC: 13.2 10*3/uL — ABNORMAL HIGH (ref 4.0–10.5)
nRBC: 0 % (ref 0.0–0.2)

## 2019-09-11 LAB — COMPREHENSIVE METABOLIC PANEL
ALT: 23 U/L (ref 0–44)
AST: 29 U/L (ref 15–41)
Albumin: 2 g/dL — ABNORMAL LOW (ref 3.5–5.0)
Alkaline Phosphatase: 68 U/L (ref 38–126)
Anion gap: 16 — ABNORMAL HIGH (ref 5–15)
BUN: 30 mg/dL — ABNORMAL HIGH (ref 8–23)
CO2: 23 mmol/L (ref 22–32)
Calcium: 8.2 mg/dL — ABNORMAL LOW (ref 8.9–10.3)
Chloride: 101 mmol/L (ref 98–111)
Creatinine, Ser: 0.77 mg/dL (ref 0.61–1.24)
GFR calc Af Amer: >60 mL/min (ref >60)
GFR calc non Af Amer: >60 mL/min (ref >60)
Glucose, Bld: 191 mg/dL — ABNORMAL HIGH (ref 70–99)
Potassium: 4.1 mmol/L (ref 3.5–5.1)
Sodium: 140 mmol/L (ref 135–145)
Total Bilirubin: 0.5 mg/dL (ref 0.3–1.2)
Total Protein: 6.2 g/dL — ABNORMAL LOW (ref 6.5–8.1)

## 2019-09-11 LAB — LACTIC ACID, PLASMA
Lactic Acid, Venous: 4.3 mmol/L (ref 0.5–1.9)
Lactic Acid, Venous: 10 mmol/L (ref 0.5–1.9)

## 2019-09-11 LAB — URINALYSIS, ROUTINE W REFLEX MICROSCOPIC
Bilirubin Urine: NEGATIVE
Glucose, UA: NEGATIVE mg/dL
Ketones, ur: NEGATIVE mg/dL
Nitrite: NEGATIVE
Protein, ur: 100 mg/dL — AB
RBC / HPF: >50 RBC/hpf — ABNORMAL HIGH (ref 0–5)
Specific Gravity, Urine: 1.014 (ref 1.005–1.030)
WBC, UA: >50 WBC/hpf — ABNORMAL HIGH (ref 0–5)
pH: 5 (ref 5.0–8.0)

## 2019-09-11 MED ORDER — ACETAMINOPHEN 650 MG RE SUPP
650.0000 mg | Freq: Once | RECTAL | Status: AC
  Administered 2019-09-11: 650 mg via RECTAL
  Filled 2019-09-11: qty 1

## 2019-09-11 MED ORDER — SODIUM CHLORIDE 0.9 % IV SOLN
2.0000 g | Freq: Once | INTRAVENOUS | Status: AC
  Administered 2019-09-11: 2 g via INTRAVENOUS
  Filled 2019-09-11: qty 2

## 2019-09-11 MED ORDER — LACTATED RINGERS IV BOLUS (SEPSIS)
1000.0000 mL | Freq: Once | INTRAVENOUS | Status: AC
  Administered 2019-09-11 (×2): 1000 mL via INTRAVENOUS

## 2019-09-11 MED ORDER — VANCOMYCIN HCL IN DEXTROSE 1-5 GM/200ML-% IV SOLN
1000.0000 mg | Freq: Once | INTRAVENOUS | Status: AC
  Administered 2019-09-11: 1000 mg via INTRAVENOUS
  Filled 2019-09-11: qty 200

## 2019-09-11 MED ORDER — SODIUM CHLORIDE 0.9 % IV SOLN
2.0000 g | Freq: Three times a day (TID) | INTRAVENOUS | Status: AC
  Administered 2019-09-12 – 2019-09-14 (×9): 2 g via INTRAVENOUS
  Filled 2019-09-11 (×9): qty 2

## 2019-09-11 MED ORDER — VANCOMYCIN HCL 1750 MG/350ML IV SOLN
1750.0000 mg | INTRAVENOUS | Status: DC
  Filled 2019-09-11: qty 350

## 2019-09-11 MED ORDER — LACTATED RINGERS IV BOLUS (SEPSIS)
250.0000 mL | Freq: Once | INTRAVENOUS | Status: AC
  Administered 2019-09-11: 250 mL via INTRAVENOUS

## 2019-09-11 MED ORDER — LACTATED RINGERS IV BOLUS (SEPSIS)
1000.0000 mL | Freq: Once | INTRAVENOUS | Status: AC
  Administered 2019-09-11: 1000 mL via INTRAVENOUS

## 2019-09-11 NOTE — ED Provider Notes (Signed)
[facility] EMERGENCY DEPARTMENT Provider Note   CSN: [id number] Arrival date & time: 09/11/19  1857     History Chief Complaint  Patient presents with  . Urinary Tract Infection  . Blood Infection    [name] is a 79 y.o. male. Level 5 caveat due to confusion. HPI Patient presents with fever and mental status changes.  Reportedly feeling generally weak and not good today.  Apparently for the last week has had purulent penile discharge with suspected UTI.  Had been vomiting.  Patient is a DNR and would not want intubation.  He is on chronic Keflex for L4 osteomyelitis.    Past Medical History:  Diagnosis Date  . A-fib ([facility])   . B-cell lymphoma ([facility] [facility]) 10/09/2011  . B-cell lymphoma ([facility])   . Cataract   . Charcot-Marie-Tooth disease   . Diabetes mellitus   . Hypertension   . 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 SIRS (systemic inflammatory response syndrome) ([facility]) 09/2015  . Stroke ([facility])   . Weakness     Patient Active Problem List   Diagnosis Date Noted  . Medication monitoring encounter 06/01/2019  . Hypoglycemia 06/01/2019  . Pressure injury of skin 05/01/2019  . Malnutrition of moderate degree 05/01/2019  . Discitis of lumbosacral region 04/30/2019  . Weakness generalized 02/23/2019  . Lumbar compression fracture ([facility]) 02/08/2019  . Closed compression fracture of L3 lumbar vertebra, initial encounter ([facility]) 02/08/2019  . Laryngitis   . Sore throat   . Cough   . Overflow incontinence of urine   . Urinary retention   . Urinary urgency   . Hypoalbuminemia due to protein-calorie malnutrition ([facility])   . 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 Benign essential HTN   . H/O urethral stricture   . Acute blood loss anemia   . Poorly controlled type 2 diabetes mellitus with peripheral neuropathy ([place])   . Debility 06/28/2017  . Personal history of urethral stricture 06/23/2017  .  Rhabdomyolysis 06/23/2017  . Near syncope 09/23/2015  . PAF (paroxysmal atrial fibrillation) ([facility]) 09/23/2015  . Diabetes mellitus type 2, uncontrolled ([place]) 09/23/2015  . 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 Vertebrobasilar artery syndrome   . Orthostatic hypotension   . Insulin dependent diabetes mellitus   . Falls frequently   . Weakness 08/15/2015  . Encephalopathy 05/29/2014  . Recurrent falls 09/16/2013  . History of CVA (cerebrovascular accident) 09/16/2013  . Leukocytosis 09/16/2013  . Thrombocytopenia ([place]) 09/16/2013  . 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 Diabetes mellitus ([place]) 10/09/2011  . Hypertension 10/09/2011  . A-fib [facility])     Past Surgical History:  Procedure Laterality Date  . ANKLE FUSION Left   . CATARACT EXTRACTION    . CIRCUMCISION    . IR LUMBAR [place] W/IMG GUIDE  05/02/2019  . TOE AMPUTATION     right foot third toe   . URETHRA SURGERY         Family History  Problem Relation Age of Onset  . Hypertension Mother   . Hypertension Father     Social History   Tobacco Use  . Smoking status: Former Smoker    Packs/day: 2.00    Years: 35.00    Pack years: 70.00    Quit date: 06/03/1991    Years since quitting: 28.2  . Smokeless tobacco: Former Systems developer    Quit date: 06/26/1990  Substance Use Topics  .  Alcohol use: No  . Drug use: No    Home Medications Prior to Admission medications   Medication Sig Start Date End Date Taking? Authorizing Provider  acetaminophen (TYLENOL) 500 MG tablet Take 500-1,000 mg by mouth every 6 (six) hours as needed for mild pain or headache.    Yes [provider]  amLODipine (NORVASC) 10 MG tablet Take 5 mg by mouth in the morning and at bedtime.   Yes [provider]  aspirin EC 81 MG EC tablet Take 1 tablet (81 mg total) by mouth daily. Patient taking differently: Take 81 mg by mouth See admin instructions. Take 81 mg by mouth once a day at 4:30 PM 08/19/15  Yes Florencia Reasons, MD  clopidogrel  (PLAVIX) 75 MG tablet Take 75 mg by mouth daily.   Yes [provider]  HYDROcodone-acetaminophen (NORCO/VICODIN) 5-325 MG tablet Take 1 tablet by mouth every 4 (four) hours as needed for severe pain. 03/24/19  Yes Spongberg, Audie Pinto, MD  insulin degludec (TRESIBA FLEXTOUCH) 100 UNIT/ML SOPN FlexTouch Pen Inject 14 Units into the skin See admin instructions. Inject 14 units into the skin once a day at 4:30 PM   Yes [provider]  lactose free nutrition (BOOST PLUS) LIQD Take 237 mLs by mouth 3 (three) times daily with meals.   Yes [provider]  lisinopril (ZESTRIL) 10 MG tablet Take 10 mg by mouth daily. 05/11/19  Yes [provider]  metoprolol tartrate (LOPRESSOR) 25 MG tablet Take 0.5 tablets (12.5 mg total) by mouth 2 (two) times daily. Patient taking differently: Take 25 mg by mouth 2 (two) times daily.  03/24/19 09/11/19 Yes Spongberg, Audie Pinto, MD  simvastatin (ZOCOR) 20 MG tablet Take 1 tablet (20 mg total) by mouth at bedtime. Patient taking differently: Take 20 mg by mouth daily at 4 PM.  07/09/17  Yes Angiulli, Lavon Paganini, PA-C  amLODipine (NORVASC) 5 MG tablet Take 1 tablet (5 mg total) by mouth 2 (two) times daily. Patient not taking: Reported on 09/11/2019 03/24/19 09/11/19  Marcell Anger, MD  bisacodyl (DULCOLAX) 5 MG EC tablet Take 2 tablets (10 mg total) by mouth daily as needed for moderate constipation. Patient not taking: Reported on 09/11/2019 03/24/19   Marcell Anger, MD  cephALEXin (KEFLEX) 500 MG capsule Take 1 capsule (500 mg total) by mouth 4 (four) times daily. Patient not taking: Reported on 09/11/2019 07/26/19   Thayer Headings, MD  insulin glargine (LANTUS) 100 UNIT/ML injection Inject 0.12 mLs (12 Units total) into the skin daily. Patient not taking: Reported on 09/11/2019 03/25/19   Marcell Anger, MD  lisinopril (ZESTRIL) 2.5 MG tablet Take 1 tablet (2.5 mg total) by mouth daily. Patient not taking:  Reported on 09/11/2019 03/25/19 09/11/19  Marcell Anger, MD  repaglinide (PRANDIN) 0.5 MG tablet Take 0.5 mg by mouth daily. 06/01/19   [provider]    Allergies    Prevnar [pneumococcal 13-val conj vacc], Actos [pioglitazone hydrochloride], Bupropion, Hydrochlorothiazide, Sertraline, and Penicillins  Review of Systems   Review of Systems  Unable to perform ROS: Mental status change    Physical Exam Updated Vital Signs BP (!) 120/51 (BP Location: Right Arm)   Pulse 95   Temp 98 F (36.7 C) (Oral)   Resp (!) 29   Ht 5\' 11"  (1.803 m)   Wt 71.5 kg   SpO2 97%   BMI 21.98 kg/m   Physical Exam Vitals and nursing note reviewed.  Constitutional:  Comments: Awake but somewhat slow to answer.  HENT:     Head: Normocephalic.  Eyes:     Pupils: Pupils are equal, round, and reactive to light.  Cardiovascular:     Rate and Rhythm: Tachycardia present.  Pulmonary:     Breath sounds: No wheezing or rhonchi.  Abdominal:     Tenderness: There is no abdominal tenderness.  Genitourinary:    Comments: Purulent discharge from penis. Musculoskeletal:     Cervical back: Neck supple.  Skin:    Capillary Refill: Capillary refill takes less than 2 seconds.     Findings: No erythema.  Neurological:     Comments:  Awake and mild confusion.  Will answer questions overall rather appropriately however.     ED Results / Procedures / Treatments   Labs (all labs ordered are listed, but only abnormal results are displayed) Labs Reviewed  LACTIC ACID, PLASMA - Abnormal; Notable for the following components:      Result Value   Lactic Acid, Venous 4.3 (*)    All other components within normal limits  COMPREHENSIVE METABOLIC PANEL - Abnormal; Notable for the following components:   Glucose, Bld 191 (*)    BUN 30 (*)    Calcium 8.2 (*)    Total Protein 6.2 (*)    Albumin 2.0 (*)    Anion gap 16 (*)    All other components within normal limits  CBC WITH  DIFFERENTIAL/PLATELET - Abnormal; Notable for the following components:   WBC 13.2 (*)    RDW 15.7 (*)    Neutro Abs 11.7 (*)    Abs Immature Granulocytes 0.08 (*)    All other components within normal limits  URINALYSIS, ROUTINE W REFLEX MICROSCOPIC - Abnormal; Notable for the following components:   APPearance TURBID (*)    Hgb urine dipstick LARGE (*)    Protein, ur 100 (*)    Leukocytes,Ua LARGE (*)    RBC / HPF >50 (*)    WBC, UA >50 (*)    Bacteria, UA MANY (*)    All other components within normal limits  CULTURE, BLOOD (ROUTINE X 2)  CULTURE, BLOOD (ROUTINE X 2)  URINE CULTURE  SARS CORONAVIRUS 2 (TAT 6-24 HRS)  LACTIC ACID, PLASMA  PROTIME-INR  APTT    EKG EKG Interpretation  Date/Time:  Monday September 11 2019 19:48:46 EDT Ventricular Rate:  99 PR Interval:    QRS Duration: 136 QT Interval:  355 QTC Calculation: 349 R Axis:   -121 Text Interpretation: sinus with PACs and PVCs Right bundle branch block Anteroseptal infarct, age indeterminate Repol abnrm suggests ischemia, diffuse leads Minimal ST elevation, inferior leads Confirmed by Davonna Belling [phone]) on 09/11/2019 8:51:22 PM   Radiology DG Chest Port 1 View  Result Date: 09/11/2019 CLINICAL DATA:  Fever EXAM: PORTABLE CHEST 1 VIEW COMPARISON:  04/30/2019 FINDINGS: Heart and mediastinal contours are within normal limits. No focal opacities or effusions. No acute bony abnormality. Mild peribronchial thickening. IMPRESSION: Mild bronchitic changes. Electronically Signed   By: Rolm Baptise M.D.   On: 09/11/2019 19:38    Procedures Procedures (including critical care time)  Medications Ordered in ED Medications  vancomycin (VANCOREADY) IVPB 1750 mg/350 mL (has no administration in time range)  ceFEPIme (MAXIPIME) 2 g in sodium chloride 0.9 % 100 mL IVPB (has no administration in time range)  acetaminophen (TYLENOL) suppository 650 mg (650 mg Rectal Given 09/11/19 1933)  lactated ringers bolus 1,000 mL (0  mLs Intravenous Stopped 09/11/19 2103)  And  lactated ringers bolus 1,000 mL (0 mLs Intravenous Stopped 09/11/19 2103)    And  lactated ringers bolus 250 mL (0 mLs Intravenous Stopped 09/11/19 2209)  ceFEPIme (MAXIPIME) 2 g in sodium chloride 0.9 % 100 mL IVPB (0 g Intravenous Stopped 09/11/19 2103)  vancomycin (VANCOCIN) IVPB 1000 mg/200 mL premix ( Intravenous Stopped 09/11/19 2100)    ED Course  I have reviewed the triage vital signs and the nursing notes.  Pertinent labs & imaging results that were available during my care of the patient were reviewed by me and considered in my medical decision making (see chart for details).    MDM Rules/Calculators/A&P                      Patient came in febrile hypotensive and tachycardic.  Activated code sepsis, however patient is a DNR and would not want intubation and sounds if he be hesitant in terms of central line so he may not get the full response to sepsis protocol, however given IV antibiotics after discussion with pharmacy about antibiotic choice and will give 30/kg fluid bolus.  Patient has had hypotension.  Improved with IV fluids.  Blood pressure now 120.  Initial lactic elevated above 4.  Urine does show infection.  X-ray showed possible bronchitis.  Patient is awake and conversing.  Found to have necrotic left heel also.  Heel however looks pretty good overall I think the urine is more likely the source.  With somewhat limited desires in terms of CODE STATUS and scope of care will admit to internal medicine.  CRITICAL CARE Performed by: Davonna Belling Total critical care time: 30 minutes Critical care time was exclusive of separately billable procedures and treating other patients. Critical care was necessary to treat or prevent imminent or life-threatening deterioration. Critical care was time spent personally by me on the following activities: development of treatment plan with patient and/or surrogate as well as nursing,  discussions with consultants, evaluation of patient's response to treatment, examination of patient, obtaining history from patient or surrogate, ordering and performing treatments and interventions, ordering and review of laboratory studies, ordering and review of radiographic studies, pulse oximetry and re-evaluation of patient's condition.    Final Clinical Impression(s) / ED Diagnoses Final diagnoses:  Urinary tract infection without hematuria, site unspecified  Septic shock [facility])    Rx / DC Orders ED Discharge Orders    None       Davonna Belling, MD 09/11/19 2235

## 2019-09-11 NOTE — ED Triage Notes (Signed)
Pt BIB [facility] from home. Per family, pt began feeling generally weak and not feeling good today. Report also of pus coming from penis for approx. 1 week. Suspected UTI. Pt a&o x4. Pt vomiting upon arrival to department. Pt has DNR.

## 2019-09-11 NOTE — ED Notes (Signed)
[name] daughter [id number] looking for an update

## 2019-09-11 NOTE — Progress Notes (Signed)
Pharmacy Antibiotic Note  [name] is a 79 y.o. male admitted on 09/11/2019 with sepsis.  Pharmacy has been consulted for vancomycin and cefepime dosing. He is on chronic keflex per ID for osteomyelitis.   Pus coming from penis. Suspected UTI.  Tmax 104.2, WBC 13.2, Scr 0.77  Plan: Vancomycin 1000mg  then 1750mg  IV Q24h Goal AUC 400-550 Expected AUC: 474 SCr used: 0.8 Cefepime 2g IV Q8h F/u clinical progresss, c/s, de-escalation, and LOT  Height: 5\' 11"  (180.3 cm) Weight: 157 lb 10.1 oz (71.5 kg) IBW/kg (Calculated) : 75.3  Temp (24hrs), Avg:104.2 F (40.1 C), Min:104.2 F (40.1 C), Max:104.2 F (40.1 C)  Recent Labs  Lab 09/11/19 1912  WBC 13.2*  CREATININE 0.77    Estimated Creatinine Clearance: 77 mL/min (by C-G formula based on SCr of 0.77 mg/dL).    Allergies  Allergen Reactions  . Prevnar [Pneumococcal 13-Val Conj Vacc] Anaphylaxis  . Actos [Pioglitazone Hydrochloride] Other (See Comments)    Afib  . Bupropion Other (See Comments)    Pt reports of hallucination   . Hydrochlorothiazide Other (See Comments)    Lower potassium to low  . Sertraline Other (See Comments)    Pt reports of hallucination   . Penicillins Rash    Tolerated cefepime, ceftriaxone  Did it involve swelling of the face/tongue/throat, SOB, or low BP? No Did it involve sudden or severe rash/hives, skin peeling, or any reaction on the inside of your mouth or nose?Yes Did you need to seek medical attention at a hospital or doctor's office. Yes When did it last happen?78 years old If all above answers are "NO", may proceed with cephalosporin use.    Antimicrobials this admission: 3/22 vancomycin >>  3/22 cefepime >>   Dose adjustments this admission: N/A  Microbiology results: 3/22 BCx: sent 3/22 UCx: sent   Thank you for allowing pharmacy to be a part of this patient's care.  Kennon Holter, PharmD PGY1 Ambulatory Care Pharmacy Resident 09/11/2019 8:23 PM

## 2019-09-12 ENCOUNTER — Inpatient Hospital Stay (HOSPITAL_COMMUNITY): Payer: Medicare Other

## 2019-09-12 ENCOUNTER — Encounter (HOSPITAL_COMMUNITY): Payer: Self-pay | Admitting: Internal Medicine

## 2019-09-12 DIAGNOSIS — Z7189 Other specified counseling: Secondary | ICD-10-CM

## 2019-09-12 DIAGNOSIS — I2510 Atherosclerotic heart disease of native coronary artery without angina pectoris: Secondary | ICD-10-CM

## 2019-09-12 DIAGNOSIS — E876 Hypokalemia: Secondary | ICD-10-CM

## 2019-09-12 DIAGNOSIS — N390 Urinary tract infection, site not specified: Secondary | ICD-10-CM | POA: Diagnosis present

## 2019-09-12 DIAGNOSIS — C8520 Mediastinal (thymic) large B-cell lymphoma, unspecified site: Secondary | ICD-10-CM

## 2019-09-12 DIAGNOSIS — J690 Pneumonitis due to inhalation of food and vomit: Secondary | ICD-10-CM

## 2019-09-12 DIAGNOSIS — M4646 Discitis, unspecified, lumbar region: Secondary | ICD-10-CM

## 2019-09-12 DIAGNOSIS — I10 Essential (primary) hypertension: Secondary | ICD-10-CM

## 2019-09-12 DIAGNOSIS — E1165 Type 2 diabetes mellitus with hyperglycemia: Secondary | ICD-10-CM

## 2019-09-12 DIAGNOSIS — R6520 Severe sepsis without septic shock: Secondary | ICD-10-CM

## 2019-09-12 DIAGNOSIS — A419 Sepsis, unspecified organism: Secondary | ICD-10-CM

## 2019-09-12 DIAGNOSIS — I480 Paroxysmal atrial fibrillation: Secondary | ICD-10-CM

## 2019-09-12 DIAGNOSIS — R5381 Other malaise: Secondary | ICD-10-CM

## 2019-09-12 DIAGNOSIS — R778 Other specified abnormalities of plasma proteins: Secondary | ICD-10-CM

## 2019-09-12 DIAGNOSIS — R319 Hematuria, unspecified: Secondary | ICD-10-CM

## 2019-09-12 DIAGNOSIS — Z66 Do not resuscitate: Secondary | ICD-10-CM

## 2019-09-12 DIAGNOSIS — N1330 Unspecified hydronephrosis: Secondary | ICD-10-CM

## 2019-09-12 LAB — COMPREHENSIVE METABOLIC PANEL
ALT: 21 U/L (ref 0–44)
AST: 50 U/L — ABNORMAL HIGH (ref 15–41)
Albumin: 1.7 g/dL — ABNORMAL LOW (ref 3.5–5.0)
Alkaline Phosphatase: 58 U/L (ref 38–126)
Anion gap: 8 (ref 5–15)
BUN: 26 mg/dL — ABNORMAL HIGH (ref 8–23)
CO2: 26 mmol/L (ref 22–32)
Calcium: 7.8 mg/dL — ABNORMAL LOW (ref 8.9–10.3)
Chloride: 107 mmol/L (ref 98–111)
Creatinine, Ser: 0.58 mg/dL — ABNORMAL LOW (ref 0.61–1.24)
GFR calc Af Amer: >60 mL/min (ref >60)
GFR calc non Af Amer: >60 mL/min (ref >60)
Glucose, Bld: 109 mg/dL — ABNORMAL HIGH (ref 70–99)
Potassium: 3.4 mmol/L — ABNORMAL LOW (ref 3.5–5.1)
Sodium: 141 mmol/L (ref 135–145)
Total Bilirubin: 0.6 mg/dL (ref 0.3–1.2)
Total Protein: 5.1 g/dL — ABNORMAL LOW (ref 6.5–8.1)

## 2019-09-12 LAB — CBC WITH DIFFERENTIAL/PLATELET
Abs Immature Granulocytes: 0.08 10*3/uL — ABNORMAL HIGH (ref 0.00–0.07)
Basophils Absolute: 0.1 10*3/uL (ref 0.0–0.1)
Basophils Relative: 0 %
Eosinophils Absolute: 0.3 10*3/uL (ref 0.0–0.5)
Eosinophils Relative: 2 %
HCT: 37.6 % — ABNORMAL LOW (ref 39.0–52.0)
Hemoglobin: 11.8 g/dL — ABNORMAL LOW (ref 13.0–17.0)
Immature Granulocytes: 0 %
Lymphocytes Relative: 10 %
Lymphs Abs: 1.8 10*3/uL (ref 0.7–4.0)
MCH: 27 pg (ref 26.0–34.0)
MCHC: 31.4 g/dL (ref 30.0–36.0)
MCV: 86 fL (ref 80.0–100.0)
Monocytes Absolute: 1.5 10*3/uL — ABNORMAL HIGH (ref 0.1–1.0)
Monocytes Relative: 8 %
Neutro Abs: 14.3 10*3/uL — ABNORMAL HIGH (ref 1.7–7.7)
Neutrophils Relative %: 80 %
Platelets: 223 10*3/uL (ref 150–400)
RBC: 4.37 MIL/uL (ref 4.22–5.81)
RDW: 15.8 % — ABNORMAL HIGH (ref 11.5–15.5)
WBC: 18 10*3/uL — ABNORMAL HIGH (ref 4.0–10.5)
nRBC: 0 % (ref 0.0–0.2)

## 2019-09-12 LAB — CBG MONITORING, ED: Glucose-Capillary: 84 mg/dL (ref 70–99)

## 2019-09-12 LAB — PROCALCITONIN: Procalcitonin: 6.34 ng/mL

## 2019-09-12 LAB — BLOOD CULTURE ID PANEL (REFLEXED)

## 2019-09-12 LAB — CBC
HCT: 37.6 % — ABNORMAL LOW (ref 39.0–52.0)
Hemoglobin: 11.8 g/dL — ABNORMAL LOW (ref 13.0–17.0)
MCH: 26.9 pg (ref 26.0–34.0)
MCHC: 31.4 g/dL (ref 30.0–36.0)
MCV: 85.6 fL (ref 80.0–100.0)
Platelets: 201 10*3/uL (ref 150–400)
RBC: 4.39 MIL/uL (ref 4.22–5.81)
RDW: 15.7 % — ABNORMAL HIGH (ref 11.5–15.5)
WBC: 19 10*3/uL — ABNORMAL HIGH (ref 4.0–10.5)
nRBC: 0 % (ref 0.0–0.2)

## 2019-09-12 LAB — GLUCOSE, CAPILLARY
Glucose-Capillary: 100 mg/dL — ABNORMAL HIGH (ref 70–99)
Glucose-Capillary: 67 mg/dL — ABNORMAL LOW (ref 70–99)
Glucose-Capillary: 94 mg/dL (ref 70–99)

## 2019-09-12 LAB — LACTIC ACID, PLASMA
Lactic Acid, Venous: 1.1 mmol/L (ref 0.5–1.9)
Lactic Acid, Venous: 1.2 mmol/L (ref 0.5–1.9)

## 2019-09-12 LAB — CREATININE, SERUM
Creatinine, Ser: 0.58 mg/dL — ABNORMAL LOW (ref 0.61–1.24)
GFR calc Af Amer: >60 mL/min (ref >60)
GFR calc non Af Amer: >60 mL/min (ref >60)

## 2019-09-12 LAB — SARS CORONAVIRUS 2 (TAT 6-24 HRS): SARS Coronavirus 2: NEGATIVE

## 2019-09-12 LAB — TROPONIN I (HIGH SENSITIVITY): Troponin I (High Sensitivity): 7367 ng/L (ref <18)

## 2019-09-12 MED ORDER — SODIUM CHLORIDE 0.9 % IV SOLN: INTRAVENOUS | Status: AC

## 2019-09-12 MED ORDER — ASPIRIN 81 MG PO CHEW
81.0000 mg | CHEWABLE_TABLET | ORAL | Status: DC
  Administered 2019-09-12 – 2019-09-16 (×5): 81 mg via ORAL
  Filled 2019-09-12 (×5): qty 1

## 2019-09-12 MED ORDER — CLOPIDOGREL BISULFATE 75 MG PO TABS
75.0000 mg | ORAL_TABLET | Freq: Every day | ORAL | Status: DC
  Administered 2019-09-12 – 2019-09-16 (×5): 75 mg via ORAL
  Filled 2019-09-12 (×6): qty 1

## 2019-09-12 MED ORDER — POTASSIUM CHLORIDE 20 MEQ/15ML (10%) PO SOLN: 40.0000 meq | ORAL | Status: DC

## 2019-09-12 MED ORDER — ONDANSETRON HCL 4 MG/2ML IJ SOLN: 4.0000 mg | Freq: Four times a day (QID) | INTRAMUSCULAR | Status: DC | PRN

## 2019-09-12 MED ORDER — TAMSULOSIN HCL 0.4 MG PO CAPS
0.4000 mg | ORAL_CAPSULE | Freq: Every day | ORAL | Status: DC
  Administered 2019-09-14 – 2019-09-16 (×3): 0.4 mg via ORAL
  Filled 2019-09-12 (×4): qty 1

## 2019-09-12 MED ORDER — INSULIN ASPART 100 UNIT/ML ~~LOC~~ SOLN
0.0000 [IU] | Freq: Three times a day (TID) | SUBCUTANEOUS | Status: DC
  Administered 2019-09-13: 2 [IU] via SUBCUTANEOUS
  Administered 2019-09-14 (×2): 3 [IU] via SUBCUTANEOUS
  Administered 2019-09-15: 2 [IU] via SUBCUTANEOUS
  Administered 2019-09-15: 1 [IU] via SUBCUTANEOUS
  Administered 2019-09-15 – 2019-09-16 (×2): 2 [IU] via SUBCUTANEOUS

## 2019-09-12 MED ORDER — HYDRALAZINE HCL 20 MG/ML IJ SOLN: 10.0000 mg | INTRAMUSCULAR | Status: DC | PRN

## 2019-09-12 MED ORDER — AMLODIPINE BESYLATE 5 MG PO TABS: 5.0000 mg | ORAL_TABLET | Freq: Every day | ORAL | Status: DC

## 2019-09-12 MED ORDER — ENOXAPARIN SODIUM 40 MG/0.4ML ~~LOC~~ SOLN
40.0000 mg | SUBCUTANEOUS | Status: DC
  Administered 2019-09-12: 40 mg via SUBCUTANEOUS
  Filled 2019-09-12: qty 0.4

## 2019-09-12 MED ORDER — ACETAMINOPHEN 650 MG RE SUPP: 650.0000 mg | Freq: Four times a day (QID) | RECTAL | Status: DC | PRN

## 2019-09-12 MED ORDER — METRONIDAZOLE IN NACL 5-0.79 MG/ML-% IV SOLN
500.0000 mg | Freq: Three times a day (TID) | INTRAVENOUS | Status: DC
  Administered 2019-09-12 – 2019-09-13 (×4): 500 mg via INTRAVENOUS
  Filled 2019-09-12 (×4): qty 100

## 2019-09-12 MED ORDER — METRONIDAZOLE IN NACL 5-0.79 MG/ML-% IV SOLN
500.0000 mg | Freq: Once | INTRAVENOUS | Status: AC
  Administered 2019-09-12: 500 mg via INTRAVENOUS
  Filled 2019-09-12: qty 100

## 2019-09-12 MED ORDER — HEPARIN (PORCINE) 25000 UT/250ML-% IV SOLN
900.0000 [IU]/h | INTRAVENOUS | Status: DC
  Administered 2019-09-12: 900 [IU]/h via INTRAVENOUS
  Filled 2019-09-12: qty 250

## 2019-09-12 MED ORDER — ONDANSETRON HCL 4 MG PO TABS: 4.0000 mg | ORAL_TABLET | Freq: Four times a day (QID) | ORAL | Status: DC | PRN

## 2019-09-12 MED ORDER — POTASSIUM CHLORIDE CRYS ER 20 MEQ PO TBCR
40.0000 meq | EXTENDED_RELEASE_TABLET | Freq: Once | ORAL | Status: AC
  Administered 2019-09-12: 40 meq via ORAL
  Filled 2019-09-12: qty 2

## 2019-09-12 MED ORDER — SODIUM CHLORIDE 0.9 % IV SOLN: INTRAVENOUS | Status: DC

## 2019-09-12 MED ORDER — ACETAMINOPHEN 325 MG PO TABS
650.0000 mg | ORAL_TABLET | Freq: Four times a day (QID) | ORAL | Status: DC | PRN
  Filled 2019-09-12: qty 2

## 2019-09-12 MED ORDER — SIMVASTATIN 20 MG PO TABS
20.0000 mg | ORAL_TABLET | Freq: Every day | ORAL | Status: DC
  Administered 2019-09-12 – 2019-09-16 (×5): 20 mg via ORAL
  Filled 2019-09-12 (×5): qty 1

## 2019-09-12 MED ORDER — SODIUM CHLORIDE 0.9 % IV BOLUS
1000.0000 mL | Freq: Once | INTRAVENOUS | Status: AC
  Administered 2019-09-12: 1000 mL via INTRAVENOUS

## 2019-09-12 MED ORDER — INSULIN GLARGINE 100 UNIT/ML ~~LOC~~ SOLN
8.0000 [IU] | Freq: Every day | SUBCUTANEOUS | Status: DC
  Administered 2019-09-12 – 2019-09-16 (×5): 8 [IU] via SUBCUTANEOUS
  Filled 2019-09-12 (×6): qty 0.08

## 2019-09-12 MED ORDER — METOPROLOL TARTRATE 25 MG PO TABS
25.0000 mg | ORAL_TABLET | Freq: Two times a day (BID) | ORAL | Status: DC
  Administered 2019-09-12 – 2019-09-16 (×8): 25 mg via ORAL
  Filled 2019-09-12 (×9): qty 1

## 2019-09-12 NOTE — Consult Note (Addendum)
Cardiology Consultation:   Patient ID: [name]; [id number]; 1940-07-15   Admit date: 09/11/2019 Date of Consult: 09/12/2019  Primary Care Provider: Alroy Dust, Carlean Jews.Marlou Sa, MD Primary Cardiologist: New to [facility]  Patient Profile:   [name] is a 79 y.o. 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 Cyndia Skeeters.   History of Present Illness:   [name] is a 79yo F with a hx as stated above who presented to [facility] on 09/12/19 with fever, chills and pus with urination for the last 2 weeks. 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 Linus Salmons 07/26/2019. At that time he was noted to have been on Keflex 4 times per day with plans for discontinuation 09/2019.  He has been bedbound for 7 to 8 months secondary to the above and requires total care for IADL/ADLs from his daughter. Palliative care services has been following along to attempt goals of care.  Daughter is his primary caretaker and has noted the increased difficulty of caring for him 24/7 now that he is bedbound. He has been noted to defer care on many occasions given his chronic stage illness. He is a DNR.  Patient reports he self discontinued his antibiotics several weeks ago because he was "tired of taking them". HPI somewhat difficult to obtain given mild dementia therefore some information has been obtained through chart review. Over the last 2 weeks, patient was noted to have developed puslike urine with fever and chills. He initially declined transport to the ED per daughter however given persistent fever, EMS was called for further evaluation.   In the ED, he was  found to be hypotensive and febrile with a temperature of 104F. Lactic acid was elevated at 4.3 therefore he was fluid resuscitated with IVF with BP improvement.  Lactic acid has now improved down to 1.2. Blood cultures were obtained and he was started on empiric antibiotics.  He denies chest pain, shortness of breath. CXR shows bronchitic changes. CBC initially showed leukocytosis at 13.4 however this is elevated to 18.0.  EKG with sinus rhythm, HR 99 bpm with PVCs, PACs and RBBB.  High-sensitivity troponin found to be markedly elevated 7367.  He was placed on IV heparin for ACS.  He was ultimately admitted to hospitalist service for the treatment of severe sepsis UTI.   Cardiology has been asked to evaluate given significant high-sensitivity troponin elevation. It appears that [name] had an echocardiogram 08/16/2015 in the setting of CVA which showed normal LV function at 60 to 65% with no regional wall motion abnormalities and G1 DD, mildly calcified aortic valve and mild diffuse calcification of the mitral valve. Carotid Doppler duplex showed a 1 to 39% stenosis of the RICA and LICA.  He was placed on ASA and Plavix at that time. e is comfortable during my exam.  He does report that he has had approximately 6 months duration of intermittent midsternal chest pressure without associated diaphoresis, nausea, vomiting or shortness of breath.  Reports that the episodes will occur at various times and are fleeting. 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   Past Medical History:  Diagnosis Date  . A-fib ([place])   . B-cell lymphoma ([place]) 10/09/2011  . B-cell lymphoma ([place])   . Cataract   . Charcot-Marie-Tooth disease   . Diabetes mellitus   . Hypertension   . 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 SIRS (systemic inflammatory response syndrome) ([place]) 09/2015  . Stroke ([place])   . Weakness     Past Surgical History:  Procedure Laterality Date  . ANKLE FUSION Left   . CATARACT EXTRACTION    . CIRCUMCISION    . IR LUMBAR [place] W/IMG GUIDE  05/02/2019  . TOE AMPUTATION     right foot third toe   . URETHRA SURGERY       Prior to Admission medications   Medication Sig Start Date End Date Taking? Authorizing Provider  acetaminophen (TYLENOL) 500 MG tablet Take 500-1,000 mg by mouth every 6 (six) hours as needed for mild pain or headache.    Yes [provider]  amLODipine (NORVASC) 10 MG tablet Take 5 mg by mouth in the morning and at bedtime.   Yes [provider]  aspirin EC 81 MG EC tablet Take 1 tablet (81 mg total) by mouth daily. Patient taking differently: Take 81 mg by mouth See admin instructions. Take 81 mg by mouth once a day at 4:30 PM 08/19/15  Yes Florencia Reasons, MD  clopidogrel (PLAVIX) 75 MG tablet Take 75 mg by mouth daily.   Yes [provider]  HYDROcodone-acetaminophen (NORCO/VICODIN) 5-325 MG tablet Take 1 tablet by mouth every 4 (four) hours as needed for severe pain. 03/24/19  Yes Spongberg, Audie Pinto, MD  insulin degludec (TRESIBA FLEXTOUCH) 100 UNIT/ML SOPN FlexTouch Pen Inject 14 Units into the skin See admin instructions. Inject 14 units into the skin once a day at 4:30 PM   Yes [provider]  lactose free nutrition (BOOST PLUS) LIQD Take 237 mLs by mouth 3 (three) times daily with meals.   Yes [provider]  lisinopril (ZESTRIL) 10 MG tablet Take 10  mg by mouth daily. 05/11/19  Yes [provider]  metoprolol tartrate (LOPRESSOR) 25 MG tablet Take 0.5 tablets (12.5 mg total) by mouth 2 (two) times daily. Patient taking differently: Take 25 mg by mouth 2 (two) times daily.  03/24/19 09/11/19 Yes Spongberg, Audie Pinto, MD  simvastatin (ZOCOR) 20 MG tablet Take 1 tablet (20 mg total) by mouth at bedtime. Patient taking differently: Take 20 mg by mouth daily at 4 PM.  07/09/17  Yes Angiulli, Lavon Paganini, PA-C  amLODipine (NORVASC) 5 MG tablet Take 1 tablet (5 mg total) by mouth 2 (two) times daily. Patient not taking: Reported on 09/11/2019 03/24/19 09/11/19  Marcell Anger, MD  bisacodyl (DULCOLAX) 5 MG EC tablet Take 2 tablets (10 mg total) by mouth daily as needed for moderate constipation. Patient not taking: Reported on 09/11/2019 03/24/19   Marcell Anger, MD  cephALEXin (KEFLEX) 500 MG capsule Take 1 capsule (500 mg total) by mouth 4 (four) times daily. Patient not taking:  Reported on 09/11/2019 07/26/19   Thayer Headings, MD  insulin glargine (LANTUS) 100 UNIT/ML injection Inject 0.12 mLs (12 Units total) into the skin daily. Patient not taking: Reported on 09/11/2019 03/25/19   Marcell Anger, MD  lisinopril (ZESTRIL) 2.5 MG tablet Take 1 tablet (2.5 mg total) by mouth daily. Patient not taking: Reported on 09/11/2019 03/25/19 09/11/19  Marcell Anger, MD  repaglinide (PRANDIN) 0.5 MG tablet Take 0.5 mg by mouth daily. 06/01/19   [provider]    Inpatient Medications: Scheduled Meds: . aspirin  81 mg Oral Q24H  . clopidogrel  75 mg Oral Daily  . insulin aspart  0-9 Units Subcutaneous TID WC  . insulin glargine  8 Units Subcutaneous Daily  . metoprolol tartrate  25 mg Oral BID  . potassium chloride  40 mEq Oral Once  . simvastatin  20 mg Oral q1600   Continuous Infusions: . sodium chloride Stopped (09/12/19 0342)  . ceFEPime (MAXIPIME) IV Stopped (09/12/19 0513)  . heparin    .  metronidazole    . vancomycin     PRN Meds: acetaminophen **OR** acetaminophen, hydrALAZINE, ondansetron **OR** ondansetron (ZOFRAN) IV  Allergies:    Allergies  Allergen Reactions  . Prevnar [Pneumococcal 13-Val Conj Vacc] Anaphylaxis  . Actos [Pioglitazone Hydrochloride] Other (See Comments)    A-fib  . Bupropion Other (See Comments)    Pt reports hallucinations  . Hydrochlorothiazide Other (See Comments)    Low potassium to lower  . Sertraline Other (See Comments)    Pt reports hallucinations  . Penicillins Rash    Tolerated cefepime, ceftriaxone Did it involve swelling of the face/tongue/throat, SOB, or low BP? No Did it involve sudden or severe rash/hives, skin peeling, or any reaction on the inside of your mouth or nose? Yes Did you need to seek medical attention at a hospital or doctor's office: Yes When did it last happen?79 years old If all above answers are "NO", may proceed with cephalosporin use.    Social History:   Social History   Socioeconomic History  . Marital status: Widowed    Spouse name: Not on file  . Number of children: Not on file  . Years of education: Not on file  . Highest education level: Not on file  Occupational History  . Not on file  Tobacco Use  . Smoking status: Former Smoker    Packs/day: 2.00    Years: 35.00    Pack years: 70.00    Quit date: 06/03/1991    Years since quitting: 28.2  . Smokeless tobacco: Former Systems developer    Quit date: 06/26/1990  Substance and Sexual Activity  . Alcohol use: No  . Drug use: No  . Sexual activity: Not on file  Other Topics Concern  . Not on file  Social History Narrative  . Not on file   Social Determinants of Health   Financial Resource Strain:   . Difficulty of Paying Living Expenses:   Food Insecurity:   . Worried About Charity fundraiser in the Last Year:   . Arboriculturist in the Last Year:   Transportation Needs:   . Film/video editor (Medical):   [name] Lack of Transportation  (Non-Medical):   Physical Activity:   . Days of Exercise per Week:   . Minutes of Exercise per Session:   Stress:   . Feeling of Stress :   Social Connections:   . Frequency of Communication with Friends and Family:   .  Frequency of Social Gatherings with Friends and Family:   . Attends Religious Services:   . Active Member of Clubs or Organizations:   . Attends Archivist Meetings:   [name] Marital Status:   Intimate Partner Violence:   . Fear of Current or Ex-Partner:   . Emotionally Abused:   [name] Physically Abused:   . Sexually Abused:     Family History:   Family History  Problem Relation Age of Onset  . Hypertension Mother   . Hypertension Father    Family Status:  Family Status  Relation Name Status  . Mother  (Not Specified)  . Father  (Not Specified)    ROS:  Please see the history of present illness.  All other ROS reviewed and negative.     Physical Exam/Data:   Vitals:   09/12/19 0600 09/12/19 0830 09/12/19 0915 09/12/19 0930  BP: (!) 148/66 123/61 (!) 128/59 (!) 123/58  Pulse: 71 80 80 77  Resp: (!) 23 18 11  (!) 0  Temp:      TempSrc:      SpO2: 100% 94% 94% 95%  Weight:      Height:        Intake/Output Summary (Last 24 hours) at 09/12/2019 1146 Last data filed at 09/12/2019 [id number] Gross per 24 hour  Intake 3767.9 ml  Output --  Net 3767.9 ml   Filed Weights   09/11/19 1900  Weight: 71.5 kg   Body mass index is 21.98 kg/m.   General: Frail, ill-appearing, NAD Skin: Warm, dry, intact  Head: Normocephalic, atraumatic, sclera non-icteric, no xanthomas, clear, moist mucus membranes. Lungs:Clear to ausculation bilaterally. No wheezes, rales, or rhonchi. Breathing is unlabored. Cardiovascular: RRR with S1 S2. No murmurs Abdomen: Soft, non-tender, non-distended. No obvious abdominal masses. Extremities: No edema. Radial pulses 2+ bilaterally Neuro: Alert and oriented. No focal deficits. No facial asymmetry. MAE spontaneously. Psych:  Responds to questions appropriately with normal affect.     EKG:  The EKG was personally reviewed and demonstrates: 09/12/2019 NSR with HR 99 bpm and RBBB with PACs/PVCs Telemetry:  Telemetry was personally reviewed and demonstrates:  09/12/19 NSR with rates in the 80-90's   Relevant CV Studies:  Echocardiogram 08/16/2015:  Study Conclusions   - Left ventricle: The cavity size was normal. There was mild focal  basal hypertrophy of the septum. Systolic function was normal.  The estimated ejection fraction was in the range of 60% to 65%.  Wall motion was normal; there were no regional wall motion  abnormalities. Features are consistent with a pseudonormal left  ventricular filling pattern, with concomitant abnormal relaxation  and increased filling pressure (grade 2 diastolic dysfunction).  Doppler parameters are consistent with high ventricular filling  pressure.  - Aortic valve: Trileaflet; normal thickness, mildly calcified  leaflets.  - Mitral valve: Calcified annulus. Mild diffuse calcification of  the anterior leaflet and posterior leaflet.   Laboratory Data:  Chemistry Recent Labs  Lab 09/11/19 1912 09/12/19 0518 09/12/19 0737  NA 140  --  141  K 4.1  --  3.4*  CL 101  --  107  CO2 23  --  26  GLUCOSE 191*  --  109*  BUN 30*  --  26*  CREATININE 0.77 0.58* 0.58*  CALCIUM 8.2*  --  7.8*  GFRNONAA >60 >60 >60  GFRAA >60 >60 >60  ANIONGAP 16*  --  8    Total Protein  Date Value Ref Range Status  09/12/2019 5.1 (L) 6.5 -  8.1 g/dL Final  10/23/2014 6.3 (L) 6.4 - 8.3 g/dL Final   Albumin  Date Value Ref Range Status  09/12/2019 1.7 (L) 3.5 - 5.0 g/dL Final  10/23/2014 3.3 (L) 3.5 - 5.0 g/dL Final   AST  Date Value Ref Range Status  09/12/2019 50 (H) 15 - 41 U/L Final  10/23/2014 18 5 - 34 U/L Final   ALT  Date Value Ref Range Status  09/12/2019 21 0 - 44 U/L Final  10/23/2014 21 0 - 55 U/L Final   Alkaline Phosphatase  Date Value Ref  Range Status  09/12/2019 58 38 - 126 U/L Final  10/23/2014 74 40 - 150 U/L Final   Total Bilirubin  Date Value Ref Range Status  09/12/2019 0.6 0.3 - 1.2 mg/dL Final  10/23/2014 0.51 0.20 - 1.20 mg/dL Final   Hematology Recent Labs  Lab 09/11/19 1912 09/12/19 0518 09/12/19 0737  WBC 13.2* 19.0* 18.0*  RBC 5.36 4.39 4.37  HGB 14.3 11.8* 11.8*  HCT 46.0 37.6* 37.6*  MCV 85.8 85.6 86.0  MCH 26.7 26.9 27.0  MCHC 31.1 31.4 31.4  RDW 15.7* 15.7* 15.8*  PLT 252 201 223   Cardiac EnzymesNo results for input(s): TROPONINI in the last 168 hours. No results for input(s): TROPIPOC in the last 168 hours.  BNPNo results for input(s): BNP, PROBNP in the last 168 hours.  DDimer No results for input(s): DDIMER in the last 168 hours. TSH:  Lab Results  Component Value Date   TSH 1.638 02/24/2019   Lipids: Lab Results  Component Value Date   CHOL 95 08/16/2015   HDL 24 (L) 08/16/2015   LDLCALC 58 08/16/2015   TRIG 67 08/16/2015   CHOLHDL 4.0 08/16/2015   HgbA1c: Lab Results  Component Value Date   HGBA1C 7.6 (H) 02/09/2019    Radiology/Studies:  CT ABDOMEN PELVIS WO CONTRAST  Result Date: 09/12/2019 CLINICAL DATA:  Abdominal pain and fever, does not feel good, pus coming from penis for 1 week, history type II diabetes mellitus, hypertension, diffuse large B-cell non-Hodgkin's lymphoma, atrial fibrillation EXAM: CT ABDOMEN AND PELVIS WITHOUT CONTRAST TECHNIQUE: Multidetector CT imaging of the abdomen and pelvis was performed following the standard protocol without IV contrast. Sagittal and coronal MPR images reconstructed from axial data set. No oral contrast was administered COMPARISON:  04/30/2019 FINDINGS: Lower chest: Emphysematous changes and peribronchial thickening. Consolidation in BILATERAL lower lobes RIGHT greater than LEFT as well as dependent atelectasis. Hepatobiliary: Calcified gallstones dependently in gallbladder. Questionable mild gallbladder wall thickening. Liver  unremarkable. Pancreas: Normal appearance Spleen: Calcified granulomata within spleen Adrenals/Urinary Tract: Adrenal glands unremarkable. Cyst at upper pole RIGHT kidney 2.9 x 2.5 cm image 28. BILATERAL hydronephrosis and hydroureter. Distended bladder with mild bladder wall thickening. No definite bladder mass. Stomach/Bowel: Normal appendix. Stool throughout colon. Prominent stool in rectum. Stomach decompressed with suboptimal assessment of gastric wall thickness. Small bowel loops unremarkable. Vascular/Lymphatic: Extensive atherosclerotic calcifications aorta, coronary arteries, iliac arteries, visceral arteries. This includes significant calcified plaque at the origins of the renal arteries bilaterally, SMA, and to lesser degree celiac artery. Aorta normal caliber. No adenopathy. Reproductive: Prostatic enlargement, gland 5.5 x 3.5 cm. Other: No free air free fluid. Question small LEFT inguinal hernia containing fat. Musculoskeletal: Scattered muscular atrophy. Bones demineralized. Scattered degenerative changes lumbar spine. Extensive bone destruction involving the endplates at 624THL, similar to previous exam, suspicious for discitis. Minor superior endplate compression deformity of T12 unchanged. Schmorl's nodes at superior and inferior L4. IMPRESSION: Cholelithiasis with questionable  mild gallbladder wall thickening, recommend correlation with ultrasound. Increased BILATERAL hydronephrosis and hydroureter potentially related to bladder distention. Bladder wall thickening which could be related to prostatic enlargement and/or chronic outlet obstruction though cystitis not excluded; recommend correlation with urinalysis. Extensive bone destruction at the endplates at 624THL suspicious for discitis, similar to previous exam. Bibasilar atelectasis and mild pulmonary infiltrates RIGHT greater than LEFT. Question small LEFT inguinal hernia containing fat. Extensive coronary arterial calcification. Aortic  Atherosclerosis (ICD10-I70.0) and Emphysema (ICD10-J43.9). Electronically Signed   By: Lavonia Dana M.D.   On: 09/12/2019 08:29   DG Chest Port 1 View  Result Date: 09/11/2019 CLINICAL DATA:  Fever EXAM: PORTABLE CHEST 1 VIEW COMPARISON:  04/30/2019 FINDINGS: Heart and mediastinal contours are within normal limits. No focal opacities or effusions. No acute bony abnormality. Mild peribronchial thickening. IMPRESSION: Mild bronchitic changes. Electronically Signed   By: Rolm Baptise M.D.   On: 09/11/2019 19:38   DG Foot 2 Views Left  Result Date: 09/12/2019 CLINICAL DATA:  Nonhealing wound for several months over the heel, initial encounter EXAM: LEFT FOOT - 2 VIEW COMPARISON:  None. FINDINGS: Postsurgical changes are noted in the distal fibula. Degenerative changes of the tibiotalar joint are seen. No acute fracture or dislocation is noted. Diffuse vascular calcifications are seen. The known heel wound is not well appreciated. Some remodeling of the posterior aspect of the calcaneus is seen. This may be projectional in nature as only a oblique image is submitted. IMPRESSION: Mild remodeling of the posterior aspect of the calcaneus although this may be projectional in nature. No definitive erosive changes to suggest osteomyelitis are seen. Mild degenerative change without acute abnormality. Electronically Signed   By: Inez Catalina M.D.   On: 09/12/2019 08:06   Assessment and Plan:   1.  Elevated troponin with known CAD: -Patient has no prior history of CAD. He presented to the ED this hospital admission for fever, chills and puslike urination for approximately 2 weeks found to be in severe sepsis secondary to UTI. He is currently being treated with empiric antibiotics and IV fluid hydration with improvement. -EKG on ED presentation with NSR with PACs, PVC and RBBB (not new) -High-sensitivity troponin found to be markedly elevated at 7367 with no anginal symptoms, likely in the setting of severe sepsis  infection  -Does report approximately 6 month duration of chest pressure symptoms with no associated symptoms including SOB, nausea, vomiting or diaphoresis. Has CRFs including DM, HTN, PAD and prior CVA -Placed on IV heparin for presumed ACS>>>will discontinue  -Discussed case with patient in regards to further ischemic evaluation given his elevated troponin level however risks and benefits need to be weighed at this time and in the setting of acute sepsis infection along with his chronic comorbid conditions including bed bound status requiring 24/7 total care, chronic osteomyelitis/discitis infection and poor overall quality of life, would not currently consider him a candidate for further cardiac invasive evaluation at this time and will plan to continue with medical treatment with ASA, Plavix and BB -Last echocardiogram from 2017 with normal LVEF, no regional wall motion abnormalities and no valvular disease -No fluid volume overload on exam -We will discontinue IV heparin at this time and continued with medical treatment -Continue ASA, Plavix -Hold PTA beta-blocker therapy secondary to soft BP  2.  History of paroxysmal atrial fibrillation: -Patient has a documented history of paroxysmal atrial fibrillation not on anticoagulation -He is maintaining normal sinus rhythm on telemetry, EKG -On PTA metoprolol tartrate 12.5  mg twice daily>> hold  3.  Hypertension: -Stable, 109/38, 101/38, 105/35, 107/38 -Continue to hold PTA amlodipine 10, metoprolol tartrate 12.5, lisinopril 2.5 secondary to soft BP  4.  Severe sepsis secondary to UTI: -Presented with a 2-week history of puslike urine with elevated lactic acid and leukocytosis treated with empiric antibiotics and IV fluid hydration -Blood culture sent with pending results -Management per primary team  5.  Acute on chronic bilateral hydronephrosis: -Found on abdominal/pelvis CT with bilateral hydronephrosis, bladder distention likely  secondary to outlet obstruction -Urology consulted per primary team  6.  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   SignedKathyrn Drown NP-C [facility] Pager: [phone] 09/12/2019 11:46 AM  I have personally seen and examined this patient. I agree with the assessment and plan as outlined above.  He has a complex history of PAF, PAD, HTN, DM, moderate dementia, osteomyelitis admitted with fever, chills and dirty urine. He has occasional chest pains. Troponin elevated at 7367. He was febrile on admission with a temp of 104. WBC elevated. He appears to have have urosepsis.  Labs reviewed by me.  EKG reviewed by me and shows sinus with PACs and PVCs  My exam:  General: thin elderly male in NAD.   HEENT: OP clear, mucus membranes moist  SKIN: warm, dry. No rashes. Neuro: No focal deficits  Musculoskeletal: Muscle strength 5/5 all ext  Psychiatric: Mood and affect normal  Neck: No JVD, no carotid bruits, no thyromegaly, no lymphadenopathy.  Lungs:Clear bilaterally, no wheezes, rhonci, crackles Cardiovascular: Regular rate and rhythm. No murmurs, gallops or rubs. Abdomen:Soft. Bowel sounds present. Non-tender.  Extremities: No lower extremity edema.   His troponin elevated is likely due to demand ischemia in the setting of sepsis. I would stop the IV heparin. Treatment of acute infectious process by the primary team. We will follow along.Would not anticipate an ischemic evaluation at this time.   Lauree Chandler 09/12/2019 1:31 PM

## 2019-09-12 NOTE — Progress Notes (Signed)
Patient to room 4E27 from ED. Vital signs obtained. CHG bath completed.On monitor CCMD notified. Alert and oriented to room and call light.

## 2019-09-12 NOTE — ED Notes (Signed)
SDU  Breakfast ordered  

## 2019-09-12 NOTE — Progress Notes (Signed)
PROGRESS NOTE  [name] [id number] DOB: 1941/04/01   PCP: Alroy Dust, L.Marlou Sa, MD  Patient is from: Home.  Bedbound at baseline.  DOA: 09/11/2019 LOS: 1  Brief Narrative / Interim history: 79 year old male with history of chronic discitis on chronic suppressive antibiotic therapy that he has not taken for 6 weeks, debility/bedbound, DM-2, CVA, paroxysmal A. fib and recurrent UTI presenting with puslike urine for 2 weeks, and fever and chills for 1 day.  Admitted for severe sepsis due to UTI.  In ED, tachycardic to 130s.  Hypotensive.  Febrile to 104.  WBC 13> 19.  Lactic acid 4.3> 10> 1.1.  UA concerning for UTI.  CXR with bronchitic changes.  EKG sinus tachycardia with PACs, PVC and RBBB.  Resuscitated with IV fluid with improvement in his vital signs.  Cultures obtained.  Started on antibiotics and admitted for severe sepsis likely due to UTI.  CT abdomen and pelvis with increased bilateral hydronephrosis and hydroureter potentially related to bladder distention due to possible outlet obstruction, extensive bone destruction of the endplates of 624THL suspicious for discitis, bibasilar atelectasis with mild infiltrate, right> left.  Troponin 7365.  Subjective: Seen and examined earlier this morning.  Patient was admitted this morning with severe sepsis due to UTI.  He is oriented x4 except date but he has no insight into why he was brought to the hospital.  He denies chest pain, dyspnea, GI or UTI symptoms.  Now hemodynamically stable.  Saturating at 93% on room air.  Objective: Vitals:   09/12/19 0600 09/12/19 0830 09/12/19 0915 09/12/19 0930  BP: (!) 148/66 123/61 (!) 128/59 (!) 123/58  Pulse: 71 80 80 77  Resp: (!) 23 18 11  (!) 0  Temp:      TempSrc:      SpO2: 100% 94% 94% 95%  Weight:      Height:        Intake/Output Summary (Last 24 hours) at 09/12/2019 1056 Last data filed at 09/12/2019 [id number] Gross per 24 hour  Intake 3767.9 ml  Output --  Net 3767.9 ml   Filed  Weights   09/11/19 1900  Weight: 71.5 kg    Examination:  GENERAL: Chronically ill-appearing.  No apparent distress. HEENT: MMM.  Vision and hearing grossly intact.  NECK: Supple.  No apparent JVD.  RESP: 93% on RA.  No IWOB.  Fair aeration bilaterally. CVS:  RRR. Heart sounds normal.  ABD/GI/GU: Bowel sounds present. Soft.  Suprapubic tenderness. MSK/EXT:  Moves extremities. No apparent deformity. No edema.  Significant muscle mass and subcu fat loss. SKIN: Pressure injury over left heel. NEURO: Awake, alert and oriented x4 except date..  No apparent focal neuro deficit. PSYCH: Calm. Normal affect.  No insight into why he is in the hospital.  Procedures:  None  Assessment & Plan: Severe sepsis due to UTI and possible aspiration pneumonia-febrile with leukocytosis and significant lactic acidosis.  Urinalysis concerning for UTI.  History of recurrent UTI.  No history of ESBL.  Sepsis physiology improved. -Continue IV fluid, vancomycin and cefepime.  Add IV Flagyl. -Check procalcitonin -Follow cultures and de-escalate antibiotics as appropriate -SLP eval for possible dysphagia.  Acute on chronic bilateral hydronephrosis-CT abdomen and pelvis shows increased bilateral hydro with bladder distention likely due to outlet obstruction.  Has suprapubic tenderness and fullness on exam. -Insert Foley catheter -We will discuss with urology.  Lactic acidosis: Likely due to sepsis.  Resolved.  Elevated troponin-likely demand ischemia in the setting of severe sepsis versus ACS but can  not exclude the later.  EKG with sinus tachycardia, PACs, PVCs and RBBB but no acute ischemic finding.  Patient denies cardiopulmonary symptoms but not a great historian. -Start heparin drip -Continue metoprolol, statin, Plavix and aspirin. -Cardiology consulted.  History of chronic discitis on suppressive antibiotics-reportedly has not taken his antibiotics in about 6 weeks. -Continue broad-spectrum  antibiotics as above for now -Pain control  Uncontrolled DM-2 with hyperglycemia: A1c 7.6% in 01/2019. Recent Labs    09/12/19 0908  GLUCAP 84  -Continue Lantus and SSI -Continue statin -Recheck hemoglobin A1c  Paroxysmal A. fib: On metoprolol for rate control.  Not on anticoagulation. -Continue metoprolol  Left heel pressure injury: POA-x-ray without evidence of osteomyelitis. -Continue offloading.  Hypokalemia -Replenish and recheck.   History of CVA: Stable -Continue home medications-statin, Plavix and aspirin  Goal of care: per patient's daughter he turned away [facility], palliative and hospice. He said "I don't give a damn thing".  However, patient has poor insight as of this morning although he is fairly oriented.  DNR and DNI appropriately.  -Palliative care for goals of care clarification           DVT prophylaxis: Heparin drip Code Status: DNR/DNI Family Communication: updated his daughter, [name]  Discharge barrier: Severe sepsis with possible NSTEMI Patient is from: Home Final disposition: To be determined  Consultants:  Cardiology Urology   Microbiology summarized: [id number] Blood cultures NGTD Urine Culture pending  Sch Meds:  Scheduled Meds: . aspirin  81 mg Oral Q24H  . clopidogrel  75 mg Oral Daily  . enoxaparin (LOVENOX) injection  40 mg Subcutaneous Q24H  . insulin aspart  0-9 Units Subcutaneous TID WC  . insulin glargine  8 Units Subcutaneous Daily  . metoprolol tartrate  25 mg Oral BID  . potassium chloride  40 mEq Oral Once  . simvastatin  20 mg Oral q1600   Continuous Infusions: . sodium chloride Stopped (09/12/19 0342)  . ceFEPime (MAXIPIME) IV Stopped (09/12/19 0513)  . metronidazole    . vancomycin     PRN Meds:.acetaminophen **OR** acetaminophen, hydrALAZINE, ondansetron **OR** ondansetron (ZOFRAN) IV  Antimicrobials: Anti-infectives (From admission, onward)   Start     Dose/Rate Route Frequency Ordered Stop   09/12/19 2000   vancomycin (VANCOREADY) IVPB 1750 mg/350 mL     1,750 mg 175 mL/hr over 120 Minutes Intravenous Every 24 hours 09/11/19 2025     09/12/19 1400  metroNIDAZOLE (FLAGYL) IVPB 500 mg     500 mg 100 mL/hr over 60 Minutes Intravenous Every 8 hours 09/12/19 0805     09/12/19 0815  metroNIDAZOLE (FLAGYL) IVPB 500 mg     500 mg 100 mL/hr over 60 Minutes Intravenous  Once 09/12/19 0807 09/12/19 1023   09/12/19 0400  ceFEPIme (MAXIPIME) 2 g in sodium chloride 0.9 % 100 mL IVPB     2 g 200 mL/hr over 30 Minutes Intravenous Every 8 hours 09/11/19 2025     09/11/19 1930  ceFEPIme (MAXIPIME) 2 g in sodium chloride 0.9 % 100 mL IVPB     2 g 200 mL/hr over 30 Minutes Intravenous  Once 09/11/19 1917 09/11/19 2103   09/11/19 1930  vancomycin (VANCOCIN) IVPB 1000 mg/200 mL premix     1,000 mg 200 mL/hr over 60 Minutes Intravenous  Once 09/11/19 1917 09/11/19 2100       I have personally reviewed the following labs and images: CBC: Recent Labs  Lab 09/11/19 1912 09/12/19 0518 09/12/19 0737  WBC 13.2* 19.0* 18.0*  NEUTROABS 11.7*  --  14.3*  HGB 14.3 11.8* 11.8*  HCT 46.0 37.6* 37.6*  MCV 85.8 85.6 86.0  PLT 252 201 223   BMP &GFR Recent Labs  Lab 09/11/19 1912 09/12/19 0518 09/12/19 0737  NA 140  --  141  K 4.1  --  3.4*  CL 101  --  107  CO2 23  --  26  GLUCOSE 191*  --  109*  BUN 30*  --  26*  CREATININE 0.77 0.58* 0.58*  CALCIUM 8.2*  --  7.8*   Estimated Creatinine Clearance: 77 mL/min (A) (by C-G formula based on SCr of 0.58 mg/dL (L)). Liver & Pancreas: Recent Labs  Lab 09/11/19 1912 09/12/19 0737  AST 29 50*  ALT 23 21  ALKPHOS 68 58  BILITOT 0.5 0.6  PROT 6.2* 5.1*  ALBUMIN 2.0* 1.7*   No results for input(s): LIPASE, AMYLASE in the last 168 hours. No results for input(s): AMMONIA in the last 168 hours. Diabetic: No results for input(s): HGBA1C in the last 72 hours. Recent Labs  Lab 09/12/19 0908  GLUCAP 84   Cardiac Enzymes: No results for input(s):  CKTOTAL, CKMB, CKMBINDEX, TROPONINI in the last 168 hours. No results for input(s): PROBNP in the last 8760 hours. Coagulation Profile: No results for input(s): INR, PROTIME in the last 168 hours. Thyroid Function Tests: No results for input(s): TSH, T4TOTAL, FREET4, T3FREE, THYROIDAB in the last 72 hours. Lipid Profile: No results for input(s): CHOL, HDL, LDLCALC, TRIG, CHOLHDL, LDLDIRECT in the last 72 hours. Anemia Panel: No results for input(s): VITAMINB12, FOLATE, FERRITIN, TIBC, IRON, RETICCTPCT in the last 72 hours. Urine analysis:    Component Value Date/Time   COLORURINE YELLOW 09/11/2019 2139   APPEARANCEUR TURBID (A) 09/11/2019 2139   LABSPEC 1.014 09/11/2019 2139   PHURINE 5.0 09/11/2019 2139   GLUCOSEU NEGATIVE 09/11/2019 2139   HGBUR LARGE (A) 09/11/2019 2139   BILIRUBINUR NEGATIVE 09/11/2019 2139   KETONESUR NEGATIVE 09/11/2019 2139   PROTEINUR 100 (A) 09/11/2019 2139   UROBILINOGEN 0.2 05/29/2014 2125   NITRITE NEGATIVE 09/11/2019 2139   LEUKOCYTESUR LARGE (A) 09/11/2019 2139   Sepsis Labs: Invalid input(s): PROCALCITONIN, [place]  Microbiology: Recent Results (from the past 240 hour(s))  Blood Culture (routine x 2)     Status: None (Preliminary result)   Collection Time: 09/11/19  7:12 PM   Specimen: BLOOD  Result Value Ref Range Status   Specimen Description BLOOD SITE NOT SPECIFIED  Final   Special Requests   Final    BOTTLES DRAWN AEROBIC AND ANAEROBIC Blood Culture results may not be optimal due to an inadequate volume of blood received in culture bottles Performed at [facility] [address], [place], [place] [address]    Culture NO GROWTH < 12 HOURS  Final   Report Status PENDING  Incomplete  Blood Culture (routine x 2)     Status: None (Preliminary result)   Collection Time: 09/11/19  7:45 PM   Specimen: BLOOD RIGHT FOREARM  Result Value Ref Range Status   Specimen Description BLOOD RIGHT FOREARM  Final   Special Requests   Final     BOTTLES DRAWN AEROBIC AND ANAEROBIC Blood Culture results may not be optimal due to an inadequate volume of blood received in culture bottles Performed at [facility], [place] [address] [place], [place] [address]    Culture NO GROWTH < 12 HOURS  Final   Report Status PENDING  Incomplete  SARS CORONAVIRUS  2 (TAT 6-24 HRS) Nasopharyngeal Nasopharyngeal Swab     Status: None   Collection Time: 09/11/19 10:20 PM   Specimen: Nasopharyngeal Swab  Result Value Ref Range Status   SARS Coronavirus 2 NEGATIVE NEGATIVE Final    Comment: (NOTE) SARS-CoV-2 target nucleic acids are NOT DETECTED. The SARS-CoV-2 RNA is generally detectable in upper and lower respiratory specimens during the acute phase of infection. Negative results do not preclude SARS-CoV-2 infection, do not rule out co-infections with other pathogens, and should not be used as the sole basis for treatment or other patient management decisions. Negative results must be combined with clinical observations, patient history, and epidemiological information. The expected result is Negative. Fact Sheet for Patients: SugarRoll.be Fact Sheet for Healthcare Providers: https://www.woods-mathews.com/ This test is not yet approved or cleared by the [place] FDA and  has been authorized for detection and/or diagnosis of SARS-CoV-2 by FDA under an Emergency Use Authorization (EUA). This EUA will remain  in effect (meaning this test can be used) for the duration of the COVID-19 declaration under Section 56 4(b)(1) of the Act, 21 U.S.C. section 360bbb-3(b)(1), unless the authorization is terminated or revoked sooner. Performed at [facility], [place] [address], [place], [place] [address]     Radiology Studies: CT ABDOMEN PELVIS WO CONTRAST  Result Date: 09/12/2019 CLINICAL DATA:  Abdominal pain and fever, does not feel good, pus coming from penis for 1 week, history type II diabetes  mellitus, hypertension, diffuse large B-cell non-Hodgkin's lymphoma, atrial fibrillation EXAM: CT ABDOMEN AND PELVIS WITHOUT CONTRAST TECHNIQUE: Multidetector CT imaging of the abdomen and pelvis was performed following the standard protocol without IV contrast. Sagittal and coronal MPR images reconstructed from axial data set. No oral contrast was administered COMPARISON:  04/30/2019 FINDINGS: Lower chest: Emphysematous changes and peribronchial thickening. Consolidation in BILATERAL lower lobes RIGHT greater than LEFT as well as dependent atelectasis. Hepatobiliary: Calcified gallstones dependently in gallbladder. Questionable mild gallbladder wall thickening. Liver unremarkable. Pancreas: Normal appearance Spleen: Calcified granulomata within spleen Adrenals/Urinary Tract: Adrenal glands unremarkable. Cyst at upper pole RIGHT kidney 2.9 x 2.5 cm image 28. BILATERAL hydronephrosis and hydroureter. Distended bladder with mild bladder wall thickening. No definite bladder mass. Stomach/Bowel: Normal appendix. Stool throughout colon. Prominent stool in rectum. Stomach decompressed with suboptimal assessment of gastric wall thickness. Small bowel loops unremarkable. Vascular/Lymphatic: Extensive atherosclerotic calcifications aorta, coronary arteries, iliac arteries, visceral arteries. This includes significant calcified plaque at the origins of the renal arteries bilaterally, SMA, and to lesser degree celiac artery. Aorta normal caliber. No adenopathy. Reproductive: Prostatic enlargement, gland 5.5 x 3.5 cm. Other: No free air free fluid. Question small LEFT inguinal hernia containing fat. Musculoskeletal: Scattered muscular atrophy. Bones demineralized. Scattered degenerative changes lumbar spine. Extensive bone destruction involving the endplates at 624THL, similar to previous exam, suspicious for discitis. Minor superior endplate compression deformity of T12 unchanged. Schmorl's nodes at superior and inferior L4.  IMPRESSION: Cholelithiasis with questionable mild gallbladder wall thickening, recommend correlation with ultrasound. Increased BILATERAL hydronephrosis and hydroureter potentially related to bladder distention. Bladder wall thickening which could be related to prostatic enlargement and/or chronic outlet obstruction though cystitis not excluded; recommend correlation with urinalysis. Extensive bone destruction at the endplates at 624THL suspicious for discitis, similar to previous exam. Bibasilar atelectasis and mild pulmonary infiltrates RIGHT greater than LEFT. Question small LEFT inguinal hernia containing fat. Extensive coronary arterial calcification. Aortic Atherosclerosis (ICD10-I70.0) and Emphysema (ICD10-J43.9). Electronically Signed   By: Lavonia Dana M.D.   On: 09/12/2019 08:29  DG Chest Port 1 View  Result Date: 09/11/2019 CLINICAL DATA:  Fever EXAM: PORTABLE CHEST 1 VIEW COMPARISON:  04/30/2019 FINDINGS: Heart and mediastinal contours are within normal limits. No focal opacities or effusions. No acute bony abnormality. Mild peribronchial thickening. IMPRESSION: Mild bronchitic changes. Electronically Signed   By: Rolm Baptise M.D.   On: 09/11/2019 19:38   DG Foot 2 Views Left  Result Date: 09/12/2019 CLINICAL DATA:  Nonhealing wound for several months over the heel, initial encounter EXAM: LEFT FOOT - 2 VIEW COMPARISON:  None. FINDINGS: Postsurgical changes are noted in the distal fibula. Degenerative changes of the tibiotalar joint are seen. No acute fracture or dislocation is noted. Diffuse vascular calcifications are seen. The known heel wound is not well appreciated. Some remodeling of the posterior aspect of the calcaneus is seen. This may be projectional in nature as only a oblique image is submitted. IMPRESSION: Mild remodeling of the posterior aspect of the calcaneus although this may be projectional in nature. No definitive erosive changes to suggest osteomyelitis are seen. Mild  degenerative change without acute abnormality. Electronically Signed   By: Inez Catalina M.D.   On: 09/12/2019 08:06    45 minutes with more than 50% spent in reviewing records, counseling patient/family and coordinating care.   Daniele Yankowski T. [place]  If 7PM-7AM, please contact night-coverage [email or web address] Password [facility] 09/12/2019, 10:56 AM

## 2019-09-12 NOTE — Progress Notes (Signed)
Palliative Medicine RN Note: Consult screening visit.  Patient is in bed 12 in the ED. He doesn't know why he's at the hospital; "I just woke up, & I was here." Flagyl infusing. He is pale, very hard of hearing. Only complaint is "my butt hurts," reporting he needs Desitin; I asked the RN at the desk to send the NT in to make sure he's clean and to apply barrier cream if ordered. He reports he wants to go home, and he struggles to remember conversations from 10 minutes earlier when I return with a colleague.  I called his daughter Thea Silversmith [phone]). She expressed high levels of fatigue and frustration regarding Mr Bundrick's care at home. She reports that she can no longer care for him without help, as he fully expects to be repositioned every 2 hours around the clock, and she also reports that he does not understand the amount of work that occurs behind the scenes to care for him, as he is completely bedbound. At this time, she wishes for him to be DNR but to receive all other aggressive care. [name] said that she will be ready for comfort care when he says he's ready, but he didn't say that.  At this time, plan for continued PMT follow up to tease out [place].  Marjie Skiff Gian Ybarra, RN, BSN, [facility] Palliative Medicine Team 09/12/2019 11:46 AM Office [phone]

## 2019-09-12 NOTE — ED Notes (Signed)
Lunch Tray Ordered @ 1028. 

## 2019-09-12 NOTE — ED Notes (Signed)
Paged Dr. Cyndia Skeeters regarding elevated troponin

## 2019-09-12 NOTE — ED Notes (Signed)
breakfast at bedside

## 2019-09-12 NOTE — Progress Notes (Signed)
ANTICOAGULATION CONSULT NOTE - Initial Consult  Pharmacy Consult for IV heparin Indication: chest pain/ACS  Allergies  Allergen Reactions  . Prevnar [Pneumococcal 13-Val Conj Vacc] Anaphylaxis  . Actos [Pioglitazone Hydrochloride] Other (See Comments)    A-fib  . Bupropion Other (See Comments)    Pt reports hallucinations  . Hydrochlorothiazide Other (See Comments)    Low potassium to lower  . Sertraline Other (See Comments)    Pt reports hallucinations  . Penicillins Rash    Tolerated cefepime, ceftriaxone Did it involve swelling of the face/tongue/throat, SOB, or low BP? No Did it involve sudden or severe rash/hives, skin peeling, or any reaction on the inside of your mouth or nose? Yes Did you need to seek medical attention at a hospital or doctor's office: Yes When did it last happen?79 years old If all above answers are "NO", may proceed with cephalosporin use.    Patient Measurements: Height: 5\' 11"  (180.3 cm) Weight: 157 lb 10.1 oz (71.5 kg) IBW/kg (Calculated) : 75.3 Heparin Dosing Weight: 71 kg   Vital Signs: BP: 123/58 (03/23 0930) Pulse Rate: 77 (03/23 0930)  Labs: Recent Labs    09/11/19 1912 09/11/19 1912 09/12/19 0518 09/12/19 0737  HGB 14.3   < > 11.8* 11.8*  HCT 46.0  --  37.6* 37.6*  PLT 252  --  201 223  CREATININE 0.77  --  0.58* 0.58*  TROPONINIHS  --   --   --  [id number]*   < > = values in this interval not displayed.    Estimated Creatinine Clearance: 77 mL/min (A) (by C-G formula based on SCr of 0.58 mg/dL (L)).   Medical History: Past Medical History:  Diagnosis Date  . A-fib ([place])   . B-cell lymphoma ([place]) 10/09/2011  . B-cell lymphoma ([place])   . Cataract   . Charcot-Marie-Tooth disease   . Diabetes mellitus   . Hypertension   . 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 SIRS (systemic inflammatory response syndrome) ([place]) 09/2015  . Stroke ([place])   . Weakness     Medications:  (Not in  a hospital admission)   Assessment: 45 YOM with h/o paroxysmal Afib not on anticoagulation at home. Pharmacy consulted to start IV heparin for ACS due to elevated troponins. H/H down from yesterday. Plt wnl. SCr wnl   Of note, patient received a dose of SQ Lovenox this AM at 0120.   Goal of Therapy:  Heparin level 0.3-0.7 units/ml Monitor platelets by anticoagulation protocol: Yes   Plan:  -D/c SQ Lovenox -Start IV heparin at 900 units/hr -F/u 8 hr HL -Monitor daily HL, CBC and s/s of bleeding   Albertina Parr, PharmD., BCPS Clinical Pharmacist Clinical phone for 09/12/19 until 3:30pm: [phone] If after 3:30pm, please refer to [facility] for unit-specific pharmacist

## 2019-09-12 NOTE — Progress Notes (Addendum)
PHARMACY - PHYSICIAN COMMUNICATION CRITICAL VALUE ALERT - BLOOD CULTURE IDENTIFICATION (BCID)  [name] is an 79 y.o. male who presented to [facility] on 09/11/2019 with a chief complaint of fever and chills  Assessment:  Patient admitted with severe sepsis, most likely urinary source.    Name of physician (or Provider) Contacted: Dr. Cyndia Skeeters paged  Current antibiotics: Vancomycin, cefepime, metronidazole  Changes to prescribed antibiotics recommended: Continue cefepime monotherapy and MD wishes to continue metronidazole for anaerobic coverage for now.  Recommend stopping vancomycin - orders received.     Results for orders placed or performed during the hospital encounter of 09/11/19  Blood Culture ID Panel (Reflexed) (Collected: 09/11/2019  7:12 PM)  Result Value Ref Range   Enterococcus species NOT DETECTED NOT DETECTED   Listeria monocytogenes NOT DETECTED NOT DETECTED   Staphylococcus species NOT DETECTED NOT DETECTED   Staphylococcus aureus (BCID) NOT DETECTED NOT DETECTED   Streptococcus species NOT DETECTED NOT DETECTED   Streptococcus agalactiae NOT DETECTED NOT DETECTED   Streptococcus pneumoniae NOT DETECTED NOT DETECTED   Streptococcus pyogenes NOT DETECTED NOT DETECTED   Acinetobacter baumannii NOT DETECTED NOT DETECTED   Enterobacteriaceae species DETECTED (A) NOT DETECTED   Enterobacter cloacae complex NOT DETECTED NOT DETECTED   Escherichia coli NOT DETECTED NOT DETECTED   Klebsiella oxytoca NOT DETECTED NOT DETECTED   Klebsiella pneumoniae NOT DETECTED NOT DETECTED   Proteus species NOT DETECTED NOT DETECTED   Serratia marcescens NOT DETECTED NOT DETECTED   Carbapenem resistance NOT DETECTED NOT DETECTED   Haemophilus influenzae NOT DETECTED NOT DETECTED   Neisseria meningitidis NOT DETECTED NOT DETECTED   Pseudomonas aeruginosa NOT DETECTED NOT DETECTED   Candida albicans NOT DETECTED NOT DETECTED   Candida glabrata NOT DETECTED NOT DETECTED   Candida  krusei NOT DETECTED NOT DETECTED   Candida parapsilosis NOT DETECTED NOT DETECTED   Candida tropicalis NOT DETECTED NOT DETECTED    [name] 09/12/2019  4:35 PM

## 2019-09-12 NOTE — Progress Notes (Signed)
Pt bladder scanned to reveal 495 in bladder after pt attempting to urinate.  Per MD order, attempted to place Foley without success.  MD paged.  Will continue to monitor.

## 2019-09-12 NOTE — Consult Note (Signed)
I have been asked to see the patient by Dr. Wendee Beavers, for evaluation and management of urethra stricture and urinary retention.  History of present illness: 79 year old male who presented to the emergency department today with fever, chills, and pus per urethra.  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  He was last dilated in the operating room in 2012.  2019 it was dilated at the bedside and small catheter was able to be passed.  He was seen in the clinic in January 2021 he refused any dilation of the urethral meatus or manipulation.  In the emergency department the patient was noted to be in urinary retention with a distended bladder and bilateral hydroureteronephrosis.  He was febrile to 104 Fahrenheit and noted to have an elevated white blood cell count.  His renal function was significantly worse.  In the emergency room to staff nurses were unable to advance a catheter into his bladder.  Review of systems: A 12 point comprehensive review of systems was obtained and is negative unless otherwise stated in the history of present illness.  Patient Active Problem List   Diagnosis Date Noted  . Acute lower UTI 09/12/2019  . Sepsis ([place]) 09/11/2019  . Medication monitoring encounter 06/01/2019  . Hypoglycemia 06/01/2019  . Pressure injury of skin 05/01/2019  . Malnutrition of moderate degree 05/01/2019  . Discitis of lumbosacral region 04/30/2019  . Weakness generalized 02/23/2019  . Lumbar compression fracture ([place]) 02/08/2019  . Closed compression fracture of L3 lumbar vertebra, initial encounter ([place]) 02/08/2019  . Laryngitis   . Sore throat   . Cough   . Overflow incontinence of urine   . Urinary retention   . Urinary urgency   . Hypoalbuminemia due to protein-calorie malnutrition ([place])   . 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 Benign essential HTN   . H/O urethral stricture   . Acute blood loss anemia   . Poorly  controlled type 2 diabetes mellitus with peripheral neuropathy ([place])   . Debility 06/28/2017  . Personal history of urethral stricture 06/23/2017  . Rhabdomyolysis 06/23/2017  . Near syncope 09/23/2015  . PAF (paroxysmal atrial fibrillation) ([place]) 09/23/2015  . Diabetes mellitus type 2, uncontrolled ([place]) 09/23/2015  . Stenosis of artery ([place])   . Vertebrobasilar artery syndrome   . Orthostatic hypotension   . Insulin dependent diabetes mellitus   . Falls frequently   . Weakness 08/15/2015  . Encephalopathy 05/29/2014  . Recurrent falls 09/16/2013  . History of CVA (cerebrovascular accident) 09/16/2013  . Leukocytosis 09/16/2013  . Thrombocytopenia ([place]) 09/16/2013  . 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 Diabetes mellitus ([place]) 10/09/2011  . Hypertension 10/09/2011  . A-fib ([place])     No current facility-administered medications on file prior to encounter.   Current Outpatient Medications on File Prior to Encounter  Medication Sig Dispense Refill  . acetaminophen (TYLENOL) 500 MG tablet Take 500-1,000 mg by mouth every 6 (six) hours as needed for mild pain or headache.     [name] amLODipine (NORVASC) 10 MG tablet Take 5 mg by mouth in the morning and at bedtime.    [name] aspirin EC 81 MG EC tablet Take 1 tablet (81 mg total) by mouth daily. (Patient taking differently: Take 81 mg by mouth See admin instructions. Take 81 mg by mouth once a day at 4:30 PM) 30 tablet 0  . clopidogrel (PLAVIX) 75 MG tablet Take 75 mg by mouth daily.    [name]  HYDROcodone-acetaminophen (NORCO/VICODIN) 5-325 MG tablet Take 1 tablet by mouth every 4 (four) hours as needed for severe pain. 30 tablet 0  . insulin degludec (TRESIBA FLEXTOUCH) 100 UNIT/ML SOPN FlexTouch Pen Inject 14 Units into the skin See admin instructions. Inject 14 units into the skin once a day at 4:30 PM    . lactose free nutrition (BOOST PLUS) LIQD Take 237 mLs by mouth 3 (three) times daily with meals.    [name] lisinopril (ZESTRIL) 10 MG tablet Take 10  mg by mouth daily.    . metoprolol tartrate (LOPRESSOR) 25 MG tablet Take 0.5 tablets (12.5 mg total) by mouth 2 (two) times daily. (Patient taking differently: Take 25 mg by mouth 2 (two) times daily. ) 30 tablet 0  . simvastatin (ZOCOR) 20 MG tablet Take 1 tablet (20 mg total) by mouth at bedtime. (Patient taking differently: Take 20 mg by mouth daily at 4 PM. ) 30 tablet 0  . amLODipine (NORVASC) 5 MG tablet Take 1 tablet (5 mg total) by mouth 2 (two) times daily. (Patient not taking: Reported on 09/11/2019) 60 tablet 0  . bisacodyl (DULCOLAX) 5 MG EC tablet Take 2 tablets (10 mg total) by mouth daily as needed for moderate constipation. (Patient not taking: Reported on 09/11/2019) 30 tablet 0  . cephALEXin (KEFLEX) 500 MG capsule Take 1 capsule (500 mg total) by mouth 4 (four) times daily. (Patient not taking: Reported on 09/11/2019) 120 capsule 1  . insulin glargine (LANTUS) 100 UNIT/ML injection Inject 0.12 mLs (12 Units total) into the skin daily. (Patient not taking: Reported on 09/11/2019) 10 mL 11  . lisinopril (ZESTRIL) 2.5 MG tablet Take 1 tablet (2.5 mg total) by mouth daily. (Patient not taking: Reported on 09/11/2019) 30 tablet 0  . repaglinide (PRANDIN) 0.5 MG tablet Take 0.5 mg by mouth daily.      Past Medical History:  Diagnosis Date  . A-fib ([place])   . 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 B-cell lymphoma ([place])   . Cataract   . Charcot-Marie-Tooth disease   . Diabetes mellitus   . Hypertension   . 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 SIRS (systemic inflammatory response syndrome) ([place]) 09/2015  . Stroke ([place])   . Weakness     Past Surgical History:  Procedure Laterality Date  . ANKLE FUSION Left   . CATARACT EXTRACTION    . CIRCUMCISION    . IR LUMBAR [place] W/IMG GUIDE  05/02/2019  . TOE AMPUTATION     right foot third toe   . URETHRA SURGERY      Social History   Tobacco Use  . Smoking status: Former Smoker     Packs/day: 2.00    Years: 35.00    Pack years: 70.00    Quit date: 06/03/1991    Years since quitting: 28.2  . Smokeless tobacco: Former Systems developer    Quit date: 06/26/1990  Substance Use Topics  . Alcohol use: No  . Drug use: No    Family History  Problem Relation Age of Onset  . Hypertension Mother   . Hypertension Father     PE: Vitals:   09/12/19 1245 09/12/19 1300 09/12/19 1315 09/12/19 1555  BP: (!) 124/48 (!) 122/57 131/76 105/90  Pulse: 83 85 92 95  Resp: 19 (!) 21 (!) 23 18  Temp:    98.2 F (36.8 C)  TempSrc:    Oral  SpO2: 96% 97% 94%  95%  Weight:      Height:       Patient appears to be in no acute distress  patient is alert and oriented x3 Atraumatic normocephalic head No cervical or supraclavicular lymphadenopathy appreciated No increased work of breathing, no audible wheezes/rhonchi Regular sinus rhythm/rate Abdomen is soft, nontender, nondistended, no CVA or suprapubic tenderness Lower extremities are symmetric without appreciable edema Grossly neurologically intact No identifiable skin lesions  Recent Labs    09/11/19 1912 09/12/19 0518 09/12/19 0737  WBC 13.2* 19.0* 18.0*  HGB 14.3 11.8* 11.8*  HCT 46.0 37.6* 37.6*   Recent Labs    09/11/19 1912 09/12/19 0518 09/12/19 0737  NA 140  --  141  K 4.1  --  3.4*  CL 101  --  107  CO2 23  --  26  GLUCOSE 191*  --  109*  BUN 30*  --  26*  CREATININE 0.77 0.58* 0.58*  CALCIUM 8.2*  --  7.8*   No results for input(s): LABPT, INR in the last 72 hours. No results for input(s): LABURIN in the last 72 hours. Results for orders placed or performed during the hospital encounter of 09/11/19  Blood Culture (routine x 2)     Status: None (Preliminary result)   Collection Time: 09/11/19  7:12 PM   Specimen: BLOOD  Result Value Ref Range Status   Specimen Description BLOOD SITE NOT SPECIFIED  Final   Special Requests   Final    BOTTLES DRAWN AEROBIC AND ANAEROBIC Blood Culture results may not be optimal  due to an inadequate volume of blood received in culture bottles Performed at [facility], [place] [address], [place], [place] [address]    Culture  Setup Time   Final    GRAM NEGATIVE RODS AEROBIC BOTTLE ONLY CRITICAL RESULT CALLED TO, READ BACK BY AND VERIFIED WITH: PHARMD JEREMY F. [id number] FCP    Culture GRAM NEGATIVE RODS  Final   Report Status PENDING  Incomplete  Blood Culture ID Panel (Reflexed)     Status: Abnormal   Collection Time: 09/11/19  7:12 PM  Result Value Ref Range Status   Enterococcus species NOT DETECTED NOT DETECTED Final   Listeria monocytogenes NOT DETECTED NOT DETECTED Final   Staphylococcus species NOT DETECTED NOT DETECTED Final   Staphylococcus aureus (BCID) NOT DETECTED NOT DETECTED Final   Streptococcus species NOT DETECTED NOT DETECTED Final   Streptococcus agalactiae NOT DETECTED NOT DETECTED Final   Streptococcus pneumoniae NOT DETECTED NOT DETECTED Final   Streptococcus pyogenes NOT DETECTED NOT DETECTED Final   Acinetobacter baumannii NOT DETECTED NOT DETECTED Final   Enterobacteriaceae species DETECTED (A) NOT DETECTED Final    Comment: Enterobacteriaceae represent a large family of gram negative bacteria, not a single organism. Refer to culture for further identification. CRITICAL RESULT CALLED TO, READ BACK BY AND VERIFIED WITH: PHARMD JEREMY F. [id number] FCP    Enterobacter cloacae complex NOT DETECTED NOT DETECTED Final   Escherichia coli NOT DETECTED NOT DETECTED Final   Klebsiella oxytoca NOT DETECTED NOT DETECTED Final   Klebsiella pneumoniae NOT DETECTED NOT DETECTED Final   Proteus species NOT DETECTED NOT DETECTED Final   Serratia marcescens NOT DETECTED NOT DETECTED Final   Carbapenem resistance NOT DETECTED NOT DETECTED Final   Haemophilus influenzae NOT DETECTED NOT DETECTED Final   Neisseria meningitidis NOT DETECTED NOT DETECTED Final   Pseudomonas aeruginosa NOT DETECTED NOT DETECTED Final   Candida albicans NOT  DETECTED NOT DETECTED  Final   Candida glabrata NOT DETECTED NOT DETECTED Final   Candida krusei NOT DETECTED NOT DETECTED Final   Candida parapsilosis NOT DETECTED NOT DETECTED Final   Candida tropicalis NOT DETECTED NOT DETECTED Final  Blood Culture (routine x 2)     Status: None (Preliminary result)   Collection Time: 09/11/19  7:45 PM   Specimen: BLOOD RIGHT FOREARM  Result Value Ref Range Status   Specimen Description BLOOD RIGHT FOREARM  Final   Special Requests   Final    BOTTLES DRAWN AEROBIC AND ANAEROBIC Blood Culture results may not be optimal due to an inadequate volume of blood received in culture bottles Performed at [facility], [address], [place], [place] [address]    Culture NO GROWTH < 12 HOURS  Final   Report Status PENDING  Incomplete  SARS CORONAVIRUS 2 (TAT 6-24 HRS) Nasopharyngeal Nasopharyngeal Swab     Status: None   Collection Time: 09/11/19 10:20 PM   Specimen: Nasopharyngeal Swab  Result Value Ref Range Status   SARS Coronavirus 2 NEGATIVE NEGATIVE Final    Comment: (NOTE) SARS-CoV-2 target nucleic acids are NOT DETECTED. The SARS-CoV-2 RNA is generally detectable in upper and lower respiratory specimens during the acute phase of infection. Negative results do not preclude SARS-CoV-2 infection, do not rule out co-infections with other pathogens, and should not be used as the sole basis for treatment or other patient management decisions. Negative results must be combined with clinical observations, patient history, and epidemiological information. The expected result is Negative. Fact Sheet for Patients: SugarRoll.be Fact Sheet for Healthcare Providers: https://www.woods-mathews.com/ This test is not yet approved or cleared by the [place] FDA and  has been authorized for detection and/or diagnosis of SARS-CoV-2 by FDA under an Emergency Use Authorization (EUA). This EUA will remain  in effect  (meaning this test can be used) for the duration of the COVID-19 declaration under Section 56 4(b)(1) of the Act, 21 U.S.C. section 360bbb-3(b)(1), unless the authorization is terminated or revoked sooner. Performed at [facility], [place] [address] [place], [place] [address]     Imaging: I independently reviewed the patient's CT scan which demonstrates a significantly distended bladder with bilateral hydroureteronephrosis  Imp: The patient has both meatal stenosis and an anterior urethral stricture which is led to him developing urinary retention.  This is not the first time that he has had urinary retention.  He has been followed in our clinic and in January was in a similar circumstance.  At that time he refused any intervention including urethral stricture dilation and catheter placement.  We also discussed suprapubic tube placement.  Currently the patient appears as if he is developing urosepsis.  He would certainly benefit from bladder decompression.  Discussion: In speaking with the patient about his current circumstance and history he was adamant that he did not want any further manipulation.  I told him that we could easily place a suprapubic catheter and bypass the urethra which be significantly more comfortable for the patient but he refused this.  He also refused urethral dilation and Foley catheter placement.  I did explain to the patient that this would ultimately lead to worsening infection, pain, renal failure, and possibly overwhelming bacteremia.  The patient did appear to be of sound mind, and seems to be understanding our conversation.  Further, we have had similar conversations in the past in our clinic with very similar conclusion.  As such, I will honor the patient's wishes  and we will forego any catheter placement at this time.  I did tell the patient that we would be available if he did change his mind, but he was quite clear that he was happy with this decision.  Please  consult [place] again if there is any additional need for urologic intervention.    Ardis Hughs

## 2019-09-12 NOTE — H&P (Signed)
History and Physical    [name] [id number] DOB: 28-Jun-1940 DOA: 09/11/2019  PCP: Alroy Dust, L.Marlou Sa, MD  Patient coming from: Home.  Chief Complaint: Fever and chills.  History obtained from patient's daughter and patient and previous records and ER physician.  HPI: [name] is a 79 y.o. 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  Patient was declining to come to the ER and did not want any care and wanted to be dying comfortably but since patient was having persistent fever daughter called EMS and EMS convince patient to come to the ER.  Per daughter patient did not have any nausea vomiting diarrhea chest pain or shortness of breath.  He has been bedbound for almost last 7 to 8 months due to the discitis.  Also has a chronic wound on the left heel which patient's daughter does dressing.  ED Course: In the ER patient was hypotensive febrile with temperature 104 F lactate was 4 blood pressure improved with fluids.  After blood cultures were obtained will start on empiric antibiotics.  UA was clearly looking like pus with UA showing features consider UTI.  On my exam patient is alert awake oriented and difficult to move his lower extremities which patient states is due to his discitis and pain.  Denies any chest pain or shortness of breath chest x-ray shows bronchitis changes.  Covid test was negative.  CBC shows WBC of [id number] complete metabolic panel shows albumin of 2 EKG shows sinus tachycardia with RBBB.  Review of Systems: As per HPI, rest all negative.   Past Medical History:  Diagnosis Date  . A-fib ([place])   . 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 B-cell lymphoma ([place])   . Cataract   . Charcot-Marie-Tooth disease   .  Diabetes mellitus   . Hypertension   . 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 SIRS (systemic inflammatory response syndrome) ([place]) 09/2015  . Stroke ([place])   . Weakness     Past Surgical History:  Procedure Laterality Date  . ANKLE FUSION Left   . CATARACT EXTRACTION    . CIRCUMCISION    . IR LUMBAR [place] W/IMG GUIDE  05/02/2019  . TOE AMPUTATION     right foot third toe   . URETHRA SURGERY       reports that he quit smoking about 28 years ago. He has a 70.00 pack-year smoking history. He quit smokeless tobacco use about 29 years ago. He reports that he does not drink alcohol or use drugs.  Allergies  Allergen Reactions  . Prevnar [Pneumococcal 13-Val Conj Vacc] Anaphylaxis  . Actos [Pioglitazone Hydrochloride] Other (See Comments)    A-fib  . Bupropion Other (See Comments)    Pt reports hallucinations  . Hydrochlorothiazide Other (See Comments)    Low potassium to lower  . Sertraline Other (See Comments)    Pt reports hallucinations  . Penicillins Rash    Tolerated cefepime, ceftriaxone Did it involve swelling of the face/tongue/throat, SOB, or low BP? No Did it involve sudden or severe rash/hives, skin peeling, or any reaction on the inside of your mouth or nose? Yes Did you need to seek  medical attention at a hospital or doctor's office: Yes When did it last happen?79 years old If all above answers are "NO", may proceed with cephalosporin use.    Family History  Problem Relation Age of Onset  . Hypertension Mother   . Hypertension Father     Prior to Admission medications   Medication Sig Start Date End Date Taking? Authorizing Provider  acetaminophen (TYLENOL) 500 MG tablet Take 500-1,000 mg by mouth every 6 (six) hours as needed for mild pain or headache.    Yes [provider]  amLODipine (NORVASC) 10 MG tablet Take 5 mg by mouth in the morning and at bedtime.   Yes [provider]   aspirin EC 81 MG EC tablet Take 1 tablet (81 mg total) by mouth daily. Patient taking differently: Take 81 mg by mouth See admin instructions. Take 81 mg by mouth once a day at 4:30 PM 08/19/15  Yes Florencia Reasons, MD  clopidogrel (PLAVIX) 75 MG tablet Take 75 mg by mouth daily.   Yes [provider]  HYDROcodone-acetaminophen (NORCO/VICODIN) 5-325 MG tablet Take 1 tablet by mouth every 4 (four) hours as needed for severe pain. 03/24/19  Yes Spongberg, Audie Pinto, MD  insulin degludec (TRESIBA FLEXTOUCH) 100 UNIT/ML SOPN FlexTouch Pen Inject 14 Units into the skin See admin instructions. Inject 14 units into the skin once a day at 4:30 PM   Yes [provider]  lactose free nutrition (BOOST PLUS) LIQD Take 237 mLs by mouth 3 (three) times daily with meals.   Yes [provider]  lisinopril (ZESTRIL) 10 MG tablet Take 10 mg by mouth daily. 05/11/19  Yes [provider]  metoprolol tartrate (LOPRESSOR) 25 MG tablet Take 0.5 tablets (12.5 mg total) by mouth 2 (two) times daily. Patient taking differently: Take 25 mg by mouth 2 (two) times daily.  03/24/19 09/11/19 Yes Spongberg, Audie Pinto, MD  simvastatin (ZOCOR) 20 MG tablet Take 1 tablet (20 mg total) by mouth at bedtime. Patient taking differently: Take 20 mg by mouth daily at 4 PM.  07/09/17  Yes Angiulli, Lavon Paganini, PA-C  amLODipine (NORVASC) 5 MG tablet Take 1 tablet (5 mg total) by mouth 2 (two) times daily. Patient not taking: Reported on 09/11/2019 03/24/19 09/11/19  Marcell Anger, MD  bisacodyl (DULCOLAX) 5 MG EC tablet Take 2 tablets (10 mg total) by mouth daily as needed for moderate constipation. Patient not taking: Reported on 09/11/2019 03/24/19   Marcell Anger, MD  cephALEXin (KEFLEX) 500 MG capsule Take 1 capsule (500 mg total) by mouth 4 (four) times daily. Patient not taking: Reported on 09/11/2019 07/26/19   Thayer Headings, MD  insulin glargine (LANTUS) 100 UNIT/ML injection Inject  0.12 mLs (12 Units total) into the skin daily. Patient not taking: Reported on 09/11/2019 03/25/19   Marcell Anger, MD  lisinopril (ZESTRIL) 2.5 MG tablet Take 1 tablet (2.5 mg total) by mouth daily. Patient not taking: Reported on 09/11/2019 03/25/19 09/11/19  Marcell Anger, MD  repaglinide (PRANDIN) 0.5 MG tablet Take 0.5 mg by mouth daily. 06/01/19   [provider]    Physical Exam: Constitutional: Moderately built and nourished. Vitals:   09/11/19 2300 09/12/19 0015 09/12/19 0030 09/12/19 0101  BP: (!) 101/50 (!) 105/46 (!) 108/47 108/62  Pulse: 94 83 83 82  Resp: (!) 29 (!) 27 (!) 27 (!) 26  Temp:      TempSrc:      SpO2: 98% 99% 99% 100%  Weight:      Height:       Eyes: Anicteric no pallor. ENMT: No discharge from the ears eyes nose or mouth. Neck: No mass felt.  No neck rigidity. Respiratory: No rhonchi or crepitations. Cardiovascular: S1-S2 heard. Abdomen: Soft nontender bowel sounds present. Musculoskeletal: No edema. Skin: Left heel ulcer appears chronic. Neurologic: Alert awake oriented time place and person.  Not able to move both lower extremities which appears to be chronic. Psychiatric: Appears normal.   Labs on Admission: I have personally reviewed following labs and imaging studies  CBC: Recent Labs  Lab 09/11/19 1912  WBC 13.2*  NEUTROABS 11.7*  HGB 14.3  HCT 46.0  MCV 85.8  PLT [id number]   Basic Metabolic Panel: Recent Labs  Lab 09/11/19 1912  NA 140  K 4.1  CL 101  CO2 23  GLUCOSE 191*  BUN 30*  CREATININE 0.77  CALCIUM 8.2*   GFR: Estimated Creatinine Clearance: 77 mL/min (by C-G formula based on SCr of 0.77 mg/dL). Liver Function Tests: Recent Labs  Lab 09/11/19 1912  AST 29  ALT 23  ALKPHOS 68  BILITOT 0.5  PROT 6.2*  ALBUMIN 2.0*   No results for input(s): LIPASE, AMYLASE in the last 168 hours. No results for input(s): AMMONIA in the last 168 hours. Coagulation Profile: No results for input(s):  INR, PROTIME in the last 168 hours. Cardiac Enzymes: No results for input(s): CKTOTAL, CKMB, CKMBINDEX, TROPONINI in the last 168 hours. BNP (last 3 results) No results for input(s): PROBNP in the last 8760 hours. HbA1C: No results for input(s): HGBA1C in the last 72 hours. CBG: No results for input(s): GLUCAP in the last 168 hours. Lipid Profile: No results for input(s): CHOL, HDL, LDLCALC, TRIG, CHOLHDL, LDLDIRECT in the last 72 hours. Thyroid Function Tests: No results for input(s): TSH, T4TOTAL, FREET4, T3FREE, THYROIDAB in the last 72 hours. Anemia Panel: No results for input(s): VITAMINB12, FOLATE, FERRITIN, TIBC, IRON, RETICCTPCT in the last 72 hours. Urine analysis:    Component Value Date/Time   COLORURINE YELLOW 09/11/2019 2139   APPEARANCEUR TURBID (A) 09/11/2019 2139   LABSPEC 1.014 09/11/2019 2139   PHURINE 5.0 09/11/2019 2139   GLUCOSEU NEGATIVE 09/11/2019 2139   HGBUR LARGE (A) 09/11/2019 2139   BILIRUBINUR NEGATIVE 09/11/2019 2139   KETONESUR NEGATIVE 09/11/2019 2139   PROTEINUR 100 (A) 09/11/2019 2139   UROBILINOGEN 0.2 05/29/2014 2125   NITRITE NEGATIVE 09/11/2019 2139   LEUKOCYTESUR LARGE (A) 09/11/2019 2139   Sepsis Labs: @LABRCNTIP (procalcitonin:4,lacticidven:4) )No results found for this or any previous visit (from the past 240 hour(s)).   Radiological Exams on Admission: DG Chest Port 1 View  Result Date: 09/11/2019 CLINICAL DATA:  Fever EXAM: PORTABLE CHEST 1 VIEW COMPARISON:  04/30/2019 FINDINGS: Heart and mediastinal contours are within normal limits. No focal opacities or effusions. No acute bony abnormality. Mild peribronchial thickening. IMPRESSION: Mild bronchitic changes. Electronically Signed   By: Rolm Baptise M.D.   On: 09/11/2019 19:38    EKG: Independently reviewed.  Sinus tachycardia RBBB.  Assessment/Plan Principal Problem:   Sepsis ([place]) Active Problems:   B-cell lymphoma ([facility])   PAF (paroxysmal atrial fibrillation) ([facility])   Type  2 diabetes mellitus with peripheral neuropathy ([place])   Acute lower UTI    1. Sepsis likely secondary to UTI given that patient has puslike urine.  We will get a CT abdomen pelvis to make sure there is no any intra-abdominal abscess and also at the same time look at the spine.  Presently on empiric antibiotics continue hydration follow lactate levels. 2. Diabetes mellitus type 2 I have decreased the insulin dose for now.  Closely follow CBGs with sliding scale coverage. 3. Hypertension we will hold off amlodipine and lisinopril due to low normal blood pressure at presentation.  We will continue metoprolol for now.  As needed IV hydralazine. 4. History of stroke on statins and antiplatelet agents. 5. History of discitis chronically bedbound with lower extremity weakness. 6. Previous history of lymphoma in remission. 7. Chronic appearing left heel ulcer for which x-ray of the left foot has been ordered. 8. History of proximal atrial fibrillation presently in sinus rhythm.  Not sure why patient is not on anticoagulation.  Given that patient has sepsis will need close monitoring for any further deterioration in inpatient status.   CT abdomen pelvis is pending.  Patient's daughter clearly stated that patient does not want anything aggressive other than continue present medications.  We will consult palliative care for goals of care.   DVT prophylaxis: Lovenox. Code Status: DNR. Family Communication: Patient's daughter. Disposition Plan: To be determined. Consults called: Palliative care. Admission status: Inpatient.   Rise Patience MD [facility] Pager [phone].  If 7PM-7AM, please contact night-coverage [email or web address] Password [email or web address]  09/12/2019, 1:09 AM

## 2019-09-12 NOTE — ED Notes (Signed)
Dr. Cyndia Skeeters aware of elevated troponin

## 2019-09-13 DIAGNOSIS — R6521 Severe sepsis with septic shock: Secondary | ICD-10-CM

## 2019-09-13 DIAGNOSIS — E43 Unspecified severe protein-calorie malnutrition: Secondary | ICD-10-CM | POA: Insufficient documentation

## 2019-09-13 DIAGNOSIS — E1142 Type 2 diabetes mellitus with diabetic polyneuropathy: Secondary | ICD-10-CM

## 2019-09-13 DIAGNOSIS — R7881 Bacteremia: Secondary | ICD-10-CM

## 2019-09-13 DIAGNOSIS — T83511A Infection and inflammatory reaction due to indwelling urethral catheter, initial encounter: Secondary | ICD-10-CM

## 2019-09-13 DIAGNOSIS — D72825 Bandemia: Secondary | ICD-10-CM

## 2019-09-13 DIAGNOSIS — R338 Other retention of urine: Secondary | ICD-10-CM

## 2019-09-13 DIAGNOSIS — L89623 Pressure ulcer of left heel, stage 3: Secondary | ICD-10-CM

## 2019-09-13 LAB — RENAL FUNCTION PANEL
Albumin: 1.6 g/dL — ABNORMAL LOW (ref 3.5–5.0)
Anion gap: 11 (ref 5–15)
BUN: 20 mg/dL (ref 8–23)
CO2: 23 mmol/L (ref 22–32)
Calcium: 7.8 mg/dL — ABNORMAL LOW (ref 8.9–10.3)
Chloride: 106 mmol/L (ref 98–111)
Creatinine, Ser: 0.71 mg/dL (ref 0.61–1.24)
GFR calc Af Amer: >60 mL/min (ref >60)
GFR calc non Af Amer: >60 mL/min (ref >60)
Glucose, Bld: 198 mg/dL — ABNORMAL HIGH (ref 70–99)
Phosphorus: 3 mg/dL (ref 2.5–4.6)
Potassium: 3.4 mmol/L — ABNORMAL LOW (ref 3.5–5.1)
Sodium: 140 mmol/L (ref 135–145)

## 2019-09-13 LAB — GLUCOSE, CAPILLARY
Glucose-Capillary: 69 mg/dL — ABNORMAL LOW (ref 70–99)
Glucose-Capillary: 108 mg/dL — ABNORMAL HIGH (ref 70–99)
Glucose-Capillary: 180 mg/dL — ABNORMAL HIGH (ref 70–99)
Glucose-Capillary: 73 mg/dL (ref 70–99)
Glucose-Capillary: 112 mg/dL — ABNORMAL HIGH (ref 70–99)

## 2019-09-13 LAB — CBC
HCT: 35.9 % — ABNORMAL LOW (ref 39.0–52.0)
Hemoglobin: 11.7 g/dL — ABNORMAL LOW (ref 13.0–17.0)
MCH: 27.1 pg (ref 26.0–34.0)
MCHC: 32.6 g/dL (ref 30.0–36.0)
MCV: 83.1 fL (ref 80.0–100.0)
Platelets: 229 10*3/uL (ref 150–400)
RBC: 4.32 MIL/uL (ref 4.22–5.81)
RDW: 15.8 % — ABNORMAL HIGH (ref 11.5–15.5)
WBC: 21.5 10*3/uL — ABNORMAL HIGH (ref 4.0–10.5)
nRBC: 0 % (ref 0.0–0.2)

## 2019-09-13 LAB — URINE CULTURE: Culture: >=100000 — AB

## 2019-09-13 LAB — MAGNESIUM: Magnesium: 1.7 mg/dL (ref 1.7–2.4)

## 2019-09-13 LAB — HEPATIC FUNCTION PANEL
ALT: 22 U/L (ref 0–44)
AST: 50 U/L — ABNORMAL HIGH (ref 15–41)
Albumin: 1.6 g/dL — ABNORMAL LOW (ref 3.5–5.0)
Alkaline Phosphatase: 67 U/L (ref 38–126)
Bilirubin, Direct: <0.1 mg/dL (ref 0.0–0.2)
Total Bilirubin: 0.5 mg/dL (ref 0.3–1.2)
Total Protein: 5.6 g/dL — ABNORMAL LOW (ref 6.5–8.1)

## 2019-09-13 LAB — HEPARIN LEVEL (UNFRACTIONATED): Heparin Unfractionated: <0.1 IU/mL — ABNORMAL LOW (ref 0.30–0.70)

## 2019-09-13 LAB — PROCALCITONIN: Procalcitonin: 4.65 ng/mL

## 2019-09-13 MED ORDER — ADULT MULTIVITAMIN W/MINERALS CH
1.0000 | ORAL_TABLET | Freq: Every day | ORAL | Status: DC
  Administered 2019-09-14 – 2019-09-16 (×3): 1 via ORAL
  Filled 2019-09-13 (×4): qty 1

## 2019-09-13 MED ORDER — POTASSIUM CHLORIDE CRYS ER 20 MEQ PO TBCR
40.0000 meq | EXTENDED_RELEASE_TABLET | ORAL | Status: AC
  Filled 2019-09-13 (×2): qty 2

## 2019-09-13 MED ORDER — BOOST PLUS PO LIQD
237.0000 mL | Freq: Three times a day (TID) | ORAL | Status: DC
  Administered 2019-09-14 (×3): 237 mL via ORAL
  Filled 2019-09-13 (×11): qty 237

## 2019-09-13 NOTE — Progress Notes (Signed)
Bladder scan 680. Notified MD.

## 2019-09-13 NOTE — Progress Notes (Signed)
Initial Nutrition Assessment  DOCUMENTATION CODES:   Severe malnutrition in context of chronic illness  INTERVENTION:    Boost Plus chocolate TID- Each supplement provides 360kcal and 14g protein.    MVI daily   NUTRITION DIAGNOSIS:   Severe Malnutrition related to chronic illness(stoke w/ dysphagia) as evidenced by energy intake < or equal to 75% for > or equal to 1 month, severe muscle depletion, mild fat depletion.  GOAL:   Patient will meet greater than or equal to 90% of their needs  MONITOR:   PO intake, Supplement acceptance, Weight trends, Labs, I & O's, Skin  REASON FOR ASSESSMENT:   Malnutrition Screening Tool    ASSESSMENT:   Patient with PMH significant for DM, HTN, stroke, and B-cell lymphoma. Presents this admission with sepsis likely secondary to UTI.  Pt endorses having decreased appetite since August after being hospitalized. States during this time he consumed one meal daily with a snack. Meal consisted of cereal or oatmeal and snack consisted of celery or banana with PB. Drinks 1-3 Boost Plus daily. Complains of swallowing issues over this time period. Was evaluated by SLP this morning and showed to be mild aspiration risk. Meal completion charted as 25% for his last meal. Discussed the importance of protein intake for preservation of lean body mass.   Pt reports a UBW of 150 lb in February and is unsure how much weight he has lost. Records indicate pt weighed 170 lb on 03/21/2019 and 157 lb this admission (7.6% wt loss in 6 months, insignificant for time frame).    Medications: SS novolog, lantus Labs: K 3.4 (L) CBG 67-180   NUTRITION - FOCUSED PHYSICAL EXAM:    Most Recent Value  Orbital Region  Mild depletion  Upper Arm Region  Moderate depletion  Thoracic and Lumbar Region  Unable to assess  Buccal Region  Mild depletion  Temple Region  Moderate depletion  Clavicle Bone Region  Severe depletion  Clavicle and Acromion Bone Region  Severe  depletion  Scapular Bone Region  Unable to assess  Dorsal Hand  Moderate depletion  Patellar Region  Moderate depletion  Anterior Thigh Region  Moderate depletion  Posterior Calf Region  Severe depletion  Edema (RD Assessment)  Mild  Hair  Reviewed  Eyes  Reviewed  Mouth  Reviewed  Skin  Reviewed  Nails  Reviewed     Diet Order:   Diet Order            Diet heart healthy/carb modified Room service appropriate? Yes; Fluid consistency: Thin  Diet effective now              EDUCATION NEEDS:   Education needs have been addressed  Skin:  Skin Assessment: Skin Integrity Issues: Skin Integrity Issues:: Stage I, Unstageable, Other (Comment) Stage I: sacrum Unstageable: L heel Other: MASD- groin/buttocks  Last BM:  3/24  Height:   Ht Readings from Last 1 Encounters:  09/11/19 5\' 11"  (1.803 m)    Weight:   Wt Readings from Last 1 Encounters:  09/13/19 72.1 kg    BMI:  Body mass index is 22.17 kg/m.  Estimated Nutritional Needs:   Kcal:  2150-2350 kcal  Protein:  110-125 grams  Fluid:  >/= 2.1 L/day   Mariana Single RD, LDN Clinical Nutrition Pager listed in [place]

## 2019-09-13 NOTE — Progress Notes (Signed)
Progress Note  Patient Name: [name] Date of Encounter: 09/13/2019  Primary Cardiologist: No primary care provider on file.   Subjective   No chest pain.   Inpatient Medications    Scheduled Meds: . aspirin  81 mg Oral Q24H  . clopidogrel  75 mg Oral Daily  . insulin aspart  0-9 Units Subcutaneous TID WC  . insulin glargine  8 Units Subcutaneous Daily  . metoprolol tartrate  25 mg Oral BID  . simvastatin  20 mg Oral q1600  . tamsulosin  0.4 mg Oral Daily   Continuous Infusions: . ceFEPime (MAXIPIME) IV 2 g (09/13/19 0413)  . metronidazole 500 mg (09/13/19 0917)   PRN Meds: acetaminophen **OR** acetaminophen, hydrALAZINE, ondansetron **OR** ondansetron (ZOFRAN) IV   Vital Signs    Vitals:   09/13/19 0400 09/13/19 0418 09/13/19 0724 09/13/19 0913  BP: 134/62  (!) 130/52 (!) 140/55  Pulse: 84  94 99  Resp: 19  17   Temp: 98 F (36.7 C)  98.2 F (36.8 C)   TempSrc: Oral  Oral   SpO2: 94%  94%   Weight:  72.1 kg    Height:        Intake/Output Summary (Last 24 hours) at 09/13/2019 1050 Last data filed at 09/13/2019 0800 Gross per 24 hour  Intake 643.5 ml  Output --  Net 643.5 ml   Last 3 Weights 09/13/2019 09/11/2019 05/04/2019  Weight (lbs) 158 lb 15.2 oz 157 lb 10.1 oz 157 lb 10.1 oz  Weight (kg) 72.1 kg 71.5 kg 71.5 kg      Telemetry    sinus - Personally Reviewed  ECG    No AM EKG - Personally Reviewed  Physical Exam   GEN: No acute distress.   Neck: No JVD Cardiac: RRR, no murmurs, rubs, or gallops.  Respiratory: Clear to auscultation bilaterally. GI: Soft, nontender, non-distended  MS: No edema; No deformity. Neuro:  Nonfocal  Psych: Normal affect   Labs    High Sensitivity Troponin:   Recent Labs  Lab 09/12/19 0737  TROPONINIHS 7,367*      Chemistry Recent Labs  Lab 09/11/19 1912 09/11/19 1912 09/12/19 0518 09/12/19 0737 09/13/19 0925  NA 140  --   --  141 140  K 4.1  --   --  3.4* 3.4*  CL 101  --   --  107 106   CO2 23  --   --  26 23  GLUCOSE 191*  --   --  109* 198*  BUN 30*  --   --  26* 20  CREATININE 0.77   < > 0.58* 0.58* 0.71  CALCIUM 8.2*  --   --  7.8* 7.8*  PROT 6.2*  --   --  5.1*  --   ALBUMIN 2.0*  --   --  1.7* 1.6*  AST 29  --   --  50*  --   ALT 23  --   --  21  --   ALKPHOS 68  --   --  58  --   BILITOT 0.5  --   --  0.6  --   GFRNONAA >60   < > >60 >60 >60  GFRAA >60   < > >60 >60 >60  ANIONGAP 16*  --   --  8 11   < > = values in this interval not displayed.     Hematology Recent Labs  Lab 09/12/19 0518 09/12/19 0737 09/13/19 0925  WBC  19.0* 18.0* 21.5*  RBC 4.39 4.37 4.32  HGB 11.8* 11.8* 11.7*  HCT 37.6* 37.6* 35.9*  MCV 85.6 86.0 83.1  MCH 26.9 27.0 27.1  MCHC 31.4 31.4 32.6  RDW 15.7* 15.8* 15.8*  PLT 201 223 229    BNPNo results for input(s): BNP, PROBNP in the last 168 hours.   DDimer No results for input(s): DDIMER in the last 168 hours.   Radiology    CT ABDOMEN PELVIS WO CONTRAST  Result Date: 09/12/2019 CLINICAL DATA:  Abdominal pain and fever, does not feel good, pus coming from penis for 1 week, history type II diabetes mellitus, hypertension, diffuse large B-cell non-Hodgkin's lymphoma, atrial fibrillation EXAM: CT ABDOMEN AND PELVIS WITHOUT CONTRAST TECHNIQUE: Multidetector CT imaging of the abdomen and pelvis was performed following the standard protocol without IV contrast. Sagittal and coronal MPR images reconstructed from axial data set. No oral contrast was administered COMPARISON:  04/30/2019 FINDINGS: Lower chest: Emphysematous changes and peribronchial thickening. Consolidation in BILATERAL lower lobes RIGHT greater than LEFT as well as dependent atelectasis. Hepatobiliary: Calcified gallstones dependently in gallbladder. Questionable mild gallbladder wall thickening. Liver unremarkable. Pancreas: Normal appearance Spleen: Calcified granulomata within spleen Adrenals/Urinary Tract: Adrenal glands unremarkable. Cyst at upper pole RIGHT  kidney 2.9 x 2.5 cm image 28. BILATERAL hydronephrosis and hydroureter. Distended bladder with mild bladder wall thickening. No definite bladder mass. Stomach/Bowel: Normal appendix. Stool throughout colon. Prominent stool in rectum. Stomach decompressed with suboptimal assessment of gastric wall thickness. Small bowel loops unremarkable. Vascular/Lymphatic: Extensive atherosclerotic calcifications aorta, coronary arteries, iliac arteries, visceral arteries. This includes significant calcified plaque at the origins of the renal arteries bilaterally, SMA, and to lesser degree celiac artery. Aorta normal caliber. No adenopathy. Reproductive: Prostatic enlargement, gland 5.5 x 3.5 cm. Other: No free air free fluid. Question small LEFT inguinal hernia containing fat. Musculoskeletal: Scattered muscular atrophy. Bones demineralized. Scattered degenerative changes lumbar spine. Extensive bone destruction involving the endplates at 624THL, similar to previous exam, suspicious for discitis. Minor superior endplate compression deformity of T12 unchanged. Schmorl's nodes at superior and inferior L4. IMPRESSION: Cholelithiasis with questionable mild gallbladder wall thickening, recommend correlation with ultrasound. Increased BILATERAL hydronephrosis and hydroureter potentially related to bladder distention. Bladder wall thickening which could be related to prostatic enlargement and/or chronic outlet obstruction though cystitis not excluded; recommend correlation with urinalysis. Extensive bone destruction at the endplates at 624THL suspicious for discitis, similar to previous exam. Bibasilar atelectasis and mild pulmonary infiltrates RIGHT greater than LEFT. Question small LEFT inguinal hernia containing fat. Extensive coronary arterial calcification. Aortic Atherosclerosis (ICD10-I70.0) and Emphysema (ICD10-J43.9). Electronically Signed   By: Lavonia Dana M.D.   On: 09/12/2019 08:29   DG Chest Port 1 View  Result Date:  09/11/2019 CLINICAL DATA:  Fever EXAM: PORTABLE CHEST 1 VIEW COMPARISON:  04/30/2019 FINDINGS: Heart and mediastinal contours are within normal limits. No focal opacities or effusions. No acute bony abnormality. Mild peribronchial thickening. IMPRESSION: Mild bronchitic changes. Electronically Signed   By: Rolm Baptise M.D.   On: 09/11/2019 19:38   DG Foot 2 Views Left  Result Date: 09/12/2019 CLINICAL DATA:  Nonhealing wound for several months over the heel, initial encounter EXAM: LEFT FOOT - 2 VIEW COMPARISON:  None. FINDINGS: Postsurgical changes are noted in the distal fibula. Degenerative changes of the tibiotalar joint are seen. No acute fracture or dislocation is noted. Diffuse vascular calcifications are seen. The known heel wound is not well appreciated. Some remodeling of the posterior aspect of the calcaneus is seen.  This may be projectional in nature as only a oblique image is submitted. IMPRESSION: Mild remodeling of the posterior aspect of the calcaneus although this may be projectional in nature. No definitive erosive changes to suggest osteomyelitis are seen. Mild degenerative change without acute abnormality. Electronically Signed   By: Inez Catalina M.D.   On: 09/12/2019 08:06    Cardiac Studies   Echo 08/16/19: - Left ventricle: The cavity size was normal. There was mild focal  basal hypertrophy of the septum. Systolic function was normal.  The estimated ejection fraction was in the range of 60% to 65%.  Wall motion was normal; there were no regional wall motion  abnormalities. Features are consistent with a pseudonormal left  ventricular filling pattern, with concomitant abnormal relaxation  and increased filling pressure (grade 2 diastolic dysfunction).  Doppler parameters are consistent with high ventricular filling  pressure.  - Aortic valve: Trileaflet; normal thickness, mildly calcified  leaflets.  - Mitral valve: Calcified annulus. Mild diffuse  calcification of  the anterior leaflet and posterior leaflet.   Patient Profile     79 y.o. male with a complex history of PAF, PAD, HTN, DM, moderate dementia, osteomyelitis admitted with fever, chills and dirty urine. He has occasional chest pains. Troponin elevated at 7367. He was febrile on admission with a temp of 104. WBC elevated. He appears to have have urosepsis  Assessment & Plan    1. Elevated troponin: His troponin elevation is felt to be due to his sepsis. He has no chest pain this am. I would not recommend any further cardiac evaluation. We will sign off. Please call with questions.   For questions or updates, please contact [place] Please consult [email or web address] for contact info under        Signed, Lauree Chandler, MD  09/13/2019, 10:50 AM

## 2019-09-13 NOTE — Evaluation (Signed)
Clinical/Bedside Swallow Evaluation Patient Details  Name: [name] MRN: [id number] Date of Birth: May 12, 1941  Today's Date: 09/13/2019 Time: SLP Start Time (ACUTE ONLY): 0900 SLP Stop Time (ACUTE ONLY): 0917 SLP Time Calculation (min) (ACUTE ONLY): 17 min  Past Medical History:  Past Medical History:  Diagnosis Date  . A-fib ([place])   . 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 B-cell lymphoma ([place])   . Cataract   . Charcot-Marie-Tooth disease   . Diabetes mellitus   . Hypertension   . 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 SIRS (systemic inflammatory response syndrome) ([place]) 09/2015  . Stroke ([place])   . Weakness    Past Surgical History:  Past Surgical History:  Procedure Laterality Date  . ANKLE FUSION Left   . CATARACT EXTRACTION    . CIRCUMCISION    . IR LUMBAR [place] W/IMG GUIDE  05/02/2019  . TOE AMPUTATION     right foot third toe   . URETHRA SURGERY     HPI:  Pt is a 79 y.o. male with a hx of paroxysmal atrial fibrillation not on anticoagulation, history of PAD with claudication, hypertension, DM2, dementia, osteomyelitis/discitis currently being followed by infectious disease, recurrent UTI, B-Cell lymphoma, CVA, lumbar compression fracture who presented to the ED with fever, chills and pus with urination for 2 weeks. Palliative care has been consulted and pt has been made a DNR with continued aggressive care. CXR: Mild bronchitic changes. SLP was consulted "for possible dysphagia".    Assessment / Plan / Recommendation Clinical Impression  Pt was seen for bedside swallow evaluation. He reported intake of regular texture solids and thin liquids. He stated that he "gets strangled" with thin liquids and that he has been symptomatic since a CVA in 2010. Per the pt, he was advised to use a right head turn to help with this but he never remembers to do it and he has "just learned to live with it". Oral mechanism exam was [facility]  and dentition was limited. He tolerated all solids and liquids without overt signs or symptoms of aspiration but mastication was mildly prolonged due to limited dentition. It is recommended that the pt's diet be maintained as regular consistency as pt has indicated that he would prefer to "just take his time" chewing.  Considering pt's reports, a modified barium swallow study was recommended to further assess swallow function. However, pt has refused this stating, "I've done all those tests years ago and I ain't gon' do those again! I just live with it." Skilled SLP services will therefore be discontinued at this time. Pt and nursing have been educated regarding this and both parties verbalized agreement with plan of care.  SLP Visit Diagnosis: Dysphagia, unspecified (R13.10)    Aspiration Risk  Mild aspiration risk    Diet Recommendation Regular;Thin liquid   Liquid Administration via: Cup;Straw Medication Administration: Whole meds with liquid Supervision: Patient able to self feed Postural Changes: Seated upright at 90 degrees    Other  Recommendations Oral Care Recommendations: Oral care BID   Follow up Recommendations None      Frequency and Duration            Prognosis        Swallow Study   General Date of Onset: 09/12/08 HPI: Pt is a 79 y.o. male with a hx of paroxysmal atrial fibrillation not on anticoagulation, history of PAD with claudication, hypertension, DM2, dementia, osteomyelitis/discitis currently  being followed by infectious disease, recurrent UTI, B-Cell lymphoma, CVA, lumbar compression fracture who presented to the ED with fever, chills and pus with urination for 2 weeks. Palliative care has been consulted and pt has been made a DNR with continued aggressive care. CXR: Mild bronchitic changes. SLP was consulted "for possible dysphagia".  Type of Study: Bedside Swallow Evaluation Previous Swallow Assessment: None Diet Prior to this Study: Regular;Thin  liquids Temperature Spikes Noted: No Respiratory Status: Room air History of Recent Intubation: No Behavior/Cognition: Alert;Cooperative;Pleasant mood Oral Cavity Assessment: Within Functional Limits Oral Care Completed by SLP: No Oral Cavity - Dentition: Missing dentition;Poor condition Vision: Functional for self-feeding Self-Feeding Abilities: Needs set up Patient Positioning: Upright in bed;Postural control adequate for testing Baseline Vocal Quality: Normal Volitional Cough: Strong Volitional Swallow: Able to elicit    Oral/Motor/Sensory Function Overall Oral Motor/Sensory Function: Within functional limits   Ice Chips Ice chips: Not tested   Thin Liquid Thin Liquid: Within functional limits Presentation: Cup;Straw    Nectar Thick Nectar Thick Liquid: Not tested   Honey Thick Honey Thick Liquid: Not tested   Puree Puree: Within functional limits Presentation: Spoon   Solid     Solid: Impaired Presentation: Self Fed Oral Phase Impairments: Impaired mastication Oral Phase Functional Implications: Impaired mastication(Prolonged d/t reduced dentition)     Pharrah Rottman I. Hardin Negus, [place], [place] Office number [phone] Pager [phone]  Horton Marshall 09/13/2019,9:35 AM

## 2019-09-13 NOTE — Progress Notes (Signed)
PROGRESS NOTE  [name] [id number] DOB: 08/19/1940   PCP: Alroy Dust, L.Marlou Sa, MD  Patient is from: Home.  Bedbound at baseline.  DOA: 09/11/2019 LOS: 2  Brief Narrative / Interim history: 79 year old male with history of chronic discitis on chronic suppressive antibiotic therapy that he has not taken for 6 weeks, debility/bedbound, DM-2, CVA, paroxysmal A. fib and recurrent UTI presenting with puslike urine for 2 weeks, and fever and chills for 1 day.  Admitted for severe sepsis due to UTI.  In ED, tachycardic to 130s.  Hypotensive.  Febrile to 104.  WBC 13> 19.  Lactic acid 4.3> 10> 1.1.  UA concerning for UTI.  CXR with bronchitic changes.  EKG sinus tachycardia with PACs, PVC and RBBB.  Resuscitated with IV fluid with improvement in his vital signs.  Cultures obtained.  Started on antibiotics and admitted for severe sepsis likely due to UTI.  CT abdomen and pelvis with increased bilateral hydronephrosis and hydroureter potentially related to bladder distention due to possible outlet obstruction, extensive bone destruction of the endplates of 624THL suspicious for discitis, bibasilar atelectasis with mild infiltrate, right> left.  Troponin 7365 which was thought to be demand ischemia from severe sepsis in the absence of cardiopulmonary symptoms.  Cardiology consulted and discontinued IV heparin.  Urology consulted for increased bilateral hydronephrosis and urinary retention.  However, patient refused urologic intervention including Foley.  Blood and urine culture with Citrobacter freundii sensitive to cefepime.  Vancomycin and Flagyl discontinued.  Palliative care consulted for goals of care discussion.  Patient was previously fired home hospice.  Subjective: Seen and examined earlier this morning.  No major events overnight or this morning.  No complaints.  Denies chest pain, dyspnea, GI or UTI symptoms.  He states he has been voiding without problem but incontinent.  He voiced  understanding the risk and benefit of refusing urologic intervention/Foley catheter.  He states "I will die anyhow".   Objective: Vitals:   09/13/19 0418 09/13/19 0724 09/13/19 0913 09/13/19 1113  BP:  (!) 130/52 (!) 140/55 132/78  Pulse:  94 99 82  Resp:  17  (!) 25  Temp:  98.2 F (36.8 C)  98.1 F (36.7 C)  TempSrc:  Oral  Oral  SpO2:  94%  95%  Weight: 72.1 kg     Height:        Intake/Output Summary (Last 24 hours) at 09/13/2019 1512 Last data filed at 09/13/2019 0800 Gross per 24 hour  Intake 443.5 ml  Output --  Net 443.5 ml   Filed Weights   09/11/19 1900 09/13/19 0418  Weight: 71.5 kg 72.1 kg    Examination:  GENERAL: Chronically ill-appearing.  No apparent distress. HEENT: MMM.  Vision and hearing grossly intact.  NECK: Supple.  No apparent JVD.  RESP: On room air.  No IWOB. Good air movement bilaterally. CVS:  RRR. Heart sounds normal.  ABD/GI/GU: Bowel sounds present. Soft. Non tender.  MSK/EXT:  Moves extremities. No edema.  Significant muscle mass and subcu fat loss. SKIN: Pressure injury over left heel. NEURO: Awake, alert and oriented appropriately.  No apparent focal neuro deficit. PSYCH: Calm. Normal affect.  Fair insight.  Procedures:  None  Assessment & Plan: Severe sepsis due to Citrobacter bacteremia and UTI-aspiration pneumonia unlikely without cardiopulmonary symptoms.  Sepsis physiology resolving except leukocytosis.  Blood and urine culture with Citrobacter freundii-sensitive to cefepime.  Procalcitonin downtrending. -Discontinue vancomycin and Flagyl -Continue IV cefepime -Mild aspiration risk per SLP.  Acute on chronic bilateral hydronephrosis  and patient with history of urethral stricture-CT abdomen and pelvis shows increased bilateral hydro with bladder distention likely due to outlet obstruction and urethral stricture. -Refused intervention by urology including Foley catheter.  Lactic acidosis: Likely due to sepsis.   Resolved.  Elevated troponin-likely demand ischemia from severe sepsis.  EKG with sinus tachycardia, PACs, PVCs and RBBB but no acute ischemic finding.  No cardiopulmonary symptoms. -Continue metoprolol, statin, Plavix and aspirin. -Cardiology signed off.  History of chronic discitis on suppressive antibiotics-reportedly has not taken his antibiotics in about 6 weeks. -Continue antibiotics as above -Pain control  Uncontrolled DM-2 with hyperglycemia: A1c 7.6% in 01/2019. Recent Labs    09/13/19 0601 09/13/19 0630 09/13/19 1115  GLUCAP 69* 108* 180*  -Continue Lantus and SSI -Continue statin -Recheck hemoglobin A1c  Paroxysmal A. fib: On metoprolol for rate control.  Not on anticoagulation. -Continue metoprolol  Left heel pressure injury: POA-x-ray without evidence of osteomyelitis. -Continue pressure offloading.  Hypokalemia -Replenish and recheck.   Bandemia-likely due to sepsis.   -Continue trending -Treatment as above.  History of CVA: Stable -Continue home medications-statin, Plavix and aspirin  Goal of care: per patient's daughter he turned away [facility], palliative and hospice in the past. He said "I don't give a damn thing".  He also refused urologic intervention (Foley insertion) by urology.  He is DNR and DNI appropriately.  -Palliative care for goals of care clarification           DVT prophylaxis: Heparin drip Code Status: DNR/DNI Family Communication: updated his daughter, [name] over the phone  Discharge barrier: Severe sepsis due to Citrobacter bacteremia and UTI on IV cefepime and goal of care clarification Patient is from: Home Final disposition: Likely home with home hospice in the next 24 to 48 hours.  Consultants:  Cardiology (off) Urology (off) Palliative care   Microbiology summarized: COVID-19 negative so far Blood cultures Citrobacter freundii Urine Culture Citrobacter freundii  Sch Meds:  Scheduled Meds: . aspirin  81 mg Oral Q24H  .  clopidogrel  75 mg Oral Daily  . insulin aspart  0-9 Units Subcutaneous TID WC  . insulin glargine  8 Units Subcutaneous Daily  . metoprolol tartrate  25 mg Oral BID  . simvastatin  20 mg Oral q1600  . tamsulosin  0.4 mg Oral Daily   Continuous Infusions: . ceFEPime (MAXIPIME) IV 2 g (09/13/19 1124)  . metronidazole 500 mg (09/13/19 1347)   PRN Meds:.acetaminophen **OR** acetaminophen, hydrALAZINE, ondansetron **OR** ondansetron (ZOFRAN) IV  Antimicrobials: Anti-infectives (From admission, onward)   Start     Dose/Rate Route Frequency Ordered Stop   09/12/19 2000  vancomycin (VANCOREADY) IVPB 1750 mg/350 mL  Status:  Discontinued     1,750 mg 175 mL/hr over 120 Minutes Intravenous Every 24 hours 09/11/19 2025 09/12/19 1641   09/12/19 1400  metroNIDAZOLE (FLAGYL) IVPB 500 mg     500 mg 100 mL/hr over 60 Minutes Intravenous Every 8 hours 09/12/19 0805     09/12/19 0815  metroNIDAZOLE (FLAGYL) IVPB 500 mg     500 mg 100 mL/hr over 60 Minutes Intravenous  Once 09/12/19 0807 09/12/19 1205   09/12/19 0400  ceFEPIme (MAXIPIME) 2 g in sodium chloride 0.9 % 100 mL IVPB     2 g 200 mL/hr over 30 Minutes Intravenous Every 8 hours 09/11/19 2025     09/11/19 1930  ceFEPIme (MAXIPIME) 2 g in sodium chloride 0.9 % 100 mL IVPB     2 g 200 mL/hr over 30 Minutes  Intravenous  Once 09/11/19 1917 09/11/19 2103   09/11/19 1930  vancomycin (VANCOCIN) IVPB 1000 mg/200 mL premix     1,000 mg 200 mL/hr over 60 Minutes Intravenous  Once 09/11/19 1917 09/11/19 2100       I have personally reviewed the following labs and images: CBC: Recent Labs  Lab 09/11/19 1912 09/12/19 0518 09/12/19 0737 09/13/19 0925  WBC 13.2* 19.0* 18.0* 21.5*  NEUTROABS 11.7*  --  14.3*  --   HGB 14.3 11.8* 11.8* 11.7*  HCT 46.0 37.6* 37.6* 35.9*  MCV 85.8 85.6 86.0 83.1  PLT 252 201 223 229   BMP &GFR Recent Labs  Lab 09/11/19 1912 09/12/19 0518 09/12/19 0737 09/13/19 0925  NA 140  --  141 140  K 4.1  --   3.4* 3.4*  CL 101  --  107 106  CO2 23  --  26 23  GLUCOSE 191*  --  109* 198*  BUN 30*  --  26* 20  CREATININE 0.77 0.58* 0.58* 0.71  CALCIUM 8.2*  --  7.8* 7.8*  MG  --   --   --  1.7  PHOS  --   --   --  3.0   Estimated Creatinine Clearance: 77.6 mL/min (by C-G formula based on SCr of 0.71 mg/dL). Liver & Pancreas: Recent Labs  Lab 09/11/19 1912 09/12/19 0737 09/13/19 0925  AST 29 50* 50*  ALT 23 21 22   ALKPHOS 68 58 67  BILITOT 0.5 0.6 0.5  PROT 6.2* 5.1* 5.6*  ALBUMIN 2.0* 1.7* 1.6*  1.6*   No results for input(s): LIPASE, AMYLASE in the last 168 hours. No results for input(s): AMMONIA in the last 168 hours. Diabetic: No results for input(s): HGBA1C in the last 72 hours. Recent Labs  Lab 09/12/19 2119 09/12/19 2201 09/13/19 0601 09/13/19 0630 09/13/19 1115  GLUCAP 67* 94 69* 108* 180*   Cardiac Enzymes: No results for input(s): CKTOTAL, CKMB, CKMBINDEX, TROPONINI in the last 168 hours. No results for input(s): PROBNP in the last 8760 hours. Coagulation Profile: No results for input(s): INR, PROTIME in the last 168 hours. Thyroid Function Tests: No results for input(s): TSH, T4TOTAL, FREET4, T3FREE, THYROIDAB in the last 72 hours. Lipid Profile: No results for input(s): CHOL, HDL, LDLCALC, TRIG, CHOLHDL, LDLDIRECT in the last 72 hours. Anemia Panel: No results for input(s): VITAMINB12, FOLATE, FERRITIN, TIBC, IRON, RETICCTPCT in the last 72 hours. Urine analysis:    Component Value Date/Time   COLORURINE YELLOW 09/11/2019 2139   APPEARANCEUR TURBID (A) 09/11/2019 2139   LABSPEC 1.014 09/11/2019 2139   PHURINE 5.0 09/11/2019 2139   GLUCOSEU NEGATIVE 09/11/2019 2139   HGBUR LARGE (A) 09/11/2019 2139   BILIRUBINUR NEGATIVE 09/11/2019 2139   KETONESUR NEGATIVE 09/11/2019 2139   PROTEINUR 100 (A) 09/11/2019 2139   UROBILINOGEN 0.2 05/29/2014 2125   NITRITE NEGATIVE 09/11/2019 2139   LEUKOCYTESUR LARGE (A) 09/11/2019 2139   Sepsis Labs: Invalid  input(s): PROCALCITONIN, [place]  Microbiology: Recent Results (from the past 240 hour(s))  Blood Culture (routine x 2)     Status: Abnormal (Preliminary result)   Collection Time: 09/11/19  7:12 PM   Specimen: BLOOD  Result Value Ref Range Status   Specimen Description BLOOD SITE NOT SPECIFIED  Final   Special Requests   Final    BOTTLES DRAWN AEROBIC AND ANAEROBIC Blood Culture results may not be optimal due to an inadequate volume of blood received in culture bottles Performed at [facility]  [address], [place], [place] [address]    Culture  Setup Time   Final    GRAM NEGATIVE RODS AEROBIC BOTTLE ONLY CRITICAL RESULT CALLED TO, READ BACK BY AND VERIFIED WITH: PHARMD JEREMY F. [id number] FCP    Culture CITROBACTER FREUNDII (A)  Final   Report Status PENDING  Incomplete  Blood Culture ID Panel (Reflexed)     Status: Abnormal   Collection Time: 09/11/19  7:12 PM  Result Value Ref Range Status   Enterococcus species NOT DETECTED NOT DETECTED Final   Listeria monocytogenes NOT DETECTED NOT DETECTED Final   Staphylococcus species NOT DETECTED NOT DETECTED Final   Staphylococcus aureus (BCID) NOT DETECTED NOT DETECTED Final   Streptococcus species NOT DETECTED NOT DETECTED Final   Streptococcus agalactiae NOT DETECTED NOT DETECTED Final   Streptococcus pneumoniae NOT DETECTED NOT DETECTED Final   Streptococcus pyogenes NOT DETECTED NOT DETECTED Final   Acinetobacter baumannii NOT DETECTED NOT DETECTED Final   Enterobacteriaceae species DETECTED (A) NOT DETECTED Final    Comment: Enterobacteriaceae represent a large family of gram negative bacteria, not a single organism. Refer to culture for further identification. CRITICAL RESULT CALLED TO, READ BACK BY AND VERIFIED WITH: PHARMD JEREMY F. [id number] FCP    Enterobacter cloacae complex NOT DETECTED NOT DETECTED Final   Escherichia coli NOT DETECTED NOT DETECTED Final   Klebsiella oxytoca NOT DETECTED NOT DETECTED  Final   Klebsiella pneumoniae NOT DETECTED NOT DETECTED Final   Proteus species NOT DETECTED NOT DETECTED Final   Serratia marcescens NOT DETECTED NOT DETECTED Final   Carbapenem resistance NOT DETECTED NOT DETECTED Final   Haemophilus influenzae NOT DETECTED NOT DETECTED Final   Neisseria meningitidis NOT DETECTED NOT DETECTED Final   Pseudomonas aeruginosa NOT DETECTED NOT DETECTED Final   Candida albicans NOT DETECTED NOT DETECTED Final   Candida glabrata NOT DETECTED NOT DETECTED Final   Candida krusei NOT DETECTED NOT DETECTED Final   Candida parapsilosis NOT DETECTED NOT DETECTED Final   Candida tropicalis NOT DETECTED NOT DETECTED Final  Blood Culture (routine x 2)     Status: None (Preliminary result)   Collection Time: 09/11/19  7:45 PM   Specimen: BLOOD RIGHT FOREARM  Result Value Ref Range Status   Specimen Description BLOOD RIGHT FOREARM  Final   Special Requests   Final    BOTTLES DRAWN AEROBIC AND ANAEROBIC Blood Culture results may not be optimal due to an inadequate volume of blood received in culture bottles   Culture   Final    NO GROWTH 2 DAYS Performed at [facility], [address] [place], [place] [address]    Report Status PENDING  Incomplete  Urine culture     Status: Abnormal   Collection Time: 09/11/19  9:30 PM   Specimen: In/Out Cath Urine  Result Value Ref Range Status   Specimen Description IN/OUT CATH URINE  Final   Special Requests   Final    NONE Performed at [facility], [place] [address], [place], [place] [address]    Culture >=100,000 COLONIES/mL CITROBACTER FREUNDII (A)  Final   Report Status 09/13/2019 FINAL  Final   Organism ID, Bacteria CITROBACTER FREUNDII (A)  Final      Susceptibility   Citrobacter freundii - MIC*    CEFAZOLIN >=64 RESISTANT Resistant     CEFEPIME 2 SENSITIVE Sensitive     CEFTRIAXONE >=64 RESISTANT Resistant     CIPROFLOXACIN <=0.25 SENSITIVE Sensitive     GENTAMICIN <=1  SENSITIVE Sensitive      IMIPENEM <=0.25 SENSITIVE Sensitive     NITROFURANTOIN <=16 SENSITIVE Sensitive     TRIMETH/SULFA <=20 SENSITIVE Sensitive     PIP/TAZO 32 INTERMEDIATE Intermediate     * >=100,000 COLONIES/mL CITROBACTER FREUNDII  SARS CORONAVIRUS 2 (TAT 6-24 HRS) Nasopharyngeal Nasopharyngeal Swab     Status: None   Collection Time: 09/11/19 10:20 PM   Specimen: Nasopharyngeal Swab  Result Value Ref Range Status   SARS Coronavirus 2 NEGATIVE NEGATIVE Final    Comment: (NOTE) SARS-CoV-2 target nucleic acids are NOT DETECTED. The SARS-CoV-2 RNA is generally detectable in upper and lower respiratory specimens during the acute phase of infection. Negative results do not preclude SARS-CoV-2 infection, do not rule out co-infections with other pathogens, and should not be used as the sole basis for treatment or other patient management decisions. Negative results must be combined with clinical observations, patient history, and epidemiological information. The expected result is Negative. Fact Sheet for Patients: SugarRoll.be Fact Sheet for Healthcare Providers: https://www.woods-mathews.com/ This test is not yet approved or cleared by the [place] FDA and  has been authorized for detection and/or diagnosis of SARS-CoV-2 by FDA under an Emergency Use Authorization (EUA). This EUA will remain  in effect (meaning this test can be used) for the duration of the COVID-19 declaration under Section 56 4(b)(1) of the Act, 21 U.S.C. section 360bbb-3(b)(1), unless the authorization is terminated or revoked sooner. Performed at [facility], [place] [address], [place], [place] [address]     Radiology Studies: No results found.  Monteen Toops T. [place]  If 7PM-7AM, please contact night-coverage [email or web address] Password [facility] 09/13/2019, 3:12 PM

## 2019-09-13 NOTE — Consult Note (Signed)
Consultation Note Date: 09/13/2019   Patient Name: [name]  DOB: [birth year]  MRN: [id number]  Age / Sex: 79 y.o., male   PCP: Alroy Dust, L.Marlou Sa, MD Referring Physician: Mercy Riding, MD   REASON FOR CONSULTATION:Establishing goals of care  Palliative Care consult requested for goals of care discussion in this 79 y.o. 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 He presented from home with his daughter with complaints of fever, chills, and puslike urine. Patient was originally declining to come to hospital for evaluation, however daughter called EMS and was able to convince him to come in and be assessed. Patient recently hospitalized for discitis and started on suppressive antibiotics however, per reports he self-discontinued use expressing wishes for end of life care.   Clinical Assessment and Goals of Care: I have reviewed medical records including lab results, imaging, Epic notes, and MAR, received report from the bedside RN, and assessed the patient. I met at the bedside with [name] and his daughter, [name] to discuss diagnosis prognosis, [place], EOL wishes, disposition and options.  [name] is awake, alert and oriented x3. Denies pain or shortness of breath.   Patient was previously seen by our team during his admission back in September 2020. He was set-up with outpatient Palliative support at that time. I re-introduced Palliative Medicine as specialized medical care for people living with serious illness. It focuses on providing relief from the symptoms and stress of a serious illness. The goal is to improve qual ity of life for both the patient and the family. Both patient and daughter verbalized understanding and  appreciation.   We discussed a brief life review of the patient, along with his functional and nutritional status. Patient reports he lives with his daughter, [name], son-in-law, and grandson. [name] is the only child. His wife passed away several years ago. He is a retired Furniture conservator/restorer. Enjoyed fishing, Careers information officer, and hunting.   Prior to admission patient was bedbound and required assistance with all ADLs. He is able to self-feed. Incontinent episodes of both urine and stool. Daughter is his primary caregiver. [name] reports patient has been bed bound since January 2021, prior to that he was wheelchair bound for several months s/p fall and spinal fracture in August 2020. Daughter reports a decline in patient's appetite over the past several weeks. Daughter has also be caring for a left heel ulceration and a left great toe area which she states, the toenail fell off.   We discussed His current illness and what it means in the larger context of His on-going co-morbidities. Natural disease trajectory and expectations at EOL were discussed.  [name] states that he does not wish to continue with aggressive medical care or interventions. Acknowledging his wishes not to return to the hospital. Daughter shares she became anxious when he was experiencing high fevers and what seem to be some alterations in mental status. She reports they were being followed by [facility]. They had previously been involved with their services and considering hospice however declined previously due concerns with poorly established relationships and communication. Patient reports he is pleased with current nursing team with [facility] and would like to continue support with them with a transition of a more comfort approach of care. He shares his health challenges and his  fatigue in coming back and forth to the hospital and co-morbidities. Daughter shares concerns for care in the home and the need for support and guidance in  his care. Therapeutic listening and support given.   I attempted to elicit values and goals of care important to the patient.    The difference between aggressive medical intervention and comfort care was considered in light of the patient's goals of care. I educated patient/family on what comfort care measures would look like. [name] and daughter verbalizes wishes to continue with current medical care while hospitalized with no escalation or aggressive interventions. They would like to continue with antibiotics and other medications with hopes of gaining medical stability prior to returning home. Patient states again once he is home he does not wish to return for any form of interventions and his goal is to stay home, be comfortable, and pass away with his family surrounding him. Daughter verbalized understanding and agreement. We discussed symptoms and probable return of symptoms. Education provided on the support of hospice and managing all symptoms in the home eliminating re-hospitalizations as patient requested. Daughter expressed understanding and appreciation of discussions.   Advanced directives, concepts specific to code status, artifical feeding and hydration, and rehospitalization were considered and discussed. Patient does not  have a documented advanced directive. He is requested to complete his advance directive while hospitalized. He confirms wishes for DNR/DNI, no artificial feedings or hydration, no aggressive medical treatment. Daughter, [name] also confirms her agreement with her father's wishes. I educated patient and daughter on the MOST form and Advance Directive packet. Patient requesting to complete all forms. Patient and daughter completed initial areas on AD with pending spiritual team support for finalizations/notarizations. MOST form completed also with his designated decisions for DNR, comfort care measures, no IVF, no PEG, and antibiotics if indicated in the home only. He  requested his daughter sign for him in the setting of right arm weakness. He is aware he will be required to sign his AD with notarization. He verbalized understanding.   Hospice Care services outpatient were explained and offered. Patient and family verbalized their understanding and awareness of hospice's goals and philosophy of care. They would like to continue with [facility] and hopefully Jeannene Patella) their current team member with the company. [name] reports patient already has a hospital bed with air mattress overlay.   Questions and concerns were addressed.  Hard Choices booklet and written literature on the differences of Palliative and Hospice care left for review. The family was encouraged to call with questions or concerns.  PMT will continue to support holistically.   SOCIAL HISTORY:     reports that he quit smoking about 28 years ago. He has a 70.00 pack-year smoking history. He quit smokeless tobacco use about 29 years ago. He reports that he does not drink alcohol or use drugs.  CODE STATUS: DNR  ADVANCE DIRECTIVES: [name]   SYMPTOM MANAGEMENT: see below   Palliative Prophylaxis:   Aspiration, Bowel Regimen, Delirium Protocol, Frequent Pain Assessment, Oral Care, Palliative Wound Care and Turn Reposition  PSYCHO-SOCIAL/SPIRITUAL:  Support System: Family  Desire for further Chaplaincy support: Yes   Additional Recommendations (Limitations, Scope, Preferences):  Avoid Hospitalization, Minimize Medications, No Artificial Feeding, No Diagnostics and continue current treatment with no escalation of care, DNR/DNI   PAST MEDICAL HISTORY: Past Medical History:  Diagnosis Date  . A-fib ([place])   . B-cell lymphoma ([place]) 10/09/2011  . B-cell lymphoma ([place])   . Cataract   .  Charcot-Marie-Tooth disease   . Diabetes mellitus   . Hypertension   . 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 SIRS (systemic inflammatory response syndrome)  ([place]) 09/2015  . Stroke ([place])   . Weakness     PAST SURGICAL HISTORY:  Past Surgical History:  Procedure Laterality Date  . ANKLE FUSION Left   . CATARACT EXTRACTION    . CIRCUMCISION    . IR LUMBAR [place] W/IMG GUIDE  05/02/2019  . TOE AMPUTATION     right foot third toe   . URETHRA SURGERY      ALLERGIES:  is allergic to prevnar [pneumococcal 13-val conj vacc]; actos [pioglitazone hydrochloride]; bupropion; hydrochlorothiazide; sertraline; and penicillins.   MEDICATIONS:  Current Facility-Administered Medications  Medication Dose Route Frequency Provider Last Rate Last Admin  . acetaminophen (TYLENOL) tablet 650 mg  650 mg Oral Q6H PRN Rise Patience, MD       Or  . acetaminophen (TYLENOL) suppository 650 mg  650 mg Rectal Q6H PRN Rise Patience, MD      . aspirin chewable tablet 81 mg  81 mg Oral Q24H Rise Patience, MD   81 mg at 09/12/19 1745  . ceFEPIme (MAXIPIME) 2 g in sodium chloride 0.9 % 100 mL IVPB  2 g Intravenous Q8H Rise Patience, MD 200 mL/hr at 09/13/19 1124 2 g at 09/13/19 1124  . clopidogrel (PLAVIX) tablet 75 mg  75 mg Oral Daily Rise Patience, MD   75 mg at 09/13/19 0913  . hydrALAZINE (APRESOLINE) injection 10 mg  10 mg Intravenous Q4H PRN Rise Patience, MD      . insulin aspart (novoLOG) injection 0-9 Units  0-9 Units Subcutaneous TID WC Rise Patience, MD   2 Units at 09/13/19 1127  . insulin glargine (LANTUS) injection 8 Units  8 Units Subcutaneous Daily Rise Patience, MD   8 Units at 09/13/19 1039  . lactose free nutrition (BOOST PLUS) liquid 237 mL  237 mL Oral TID WC Gonfa, Taye T, MD      . metoprolol tartrate (LOPRESSOR) tablet 25 mg  25 mg Oral BID Rise Patience, MD   25 mg at 09/13/19 0913  . multivitamin with minerals tablet 1 tablet  1 tablet Oral Daily Wendee Beavers T, MD      . ondansetron (ZOFRAN) tablet 4 mg  4 mg Oral Q6H PRN Rise Patience, MD       Or  . ondansetron  [facility]) injection 4 mg  4 mg Intravenous Q6H PRN Rise Patience, MD      . potassium chloride SA (KLOR-CON) CR tablet 40 mEq  40 mEq Oral Q4H Gonfa, Taye T, MD      . simvastatin (ZOCOR) tablet 20 mg  20 mg Oral q1600 Rise Patience, MD   20 mg at 09/12/19 1744  . tamsulosin (FLOMAX) capsule 0.4 mg  0.4 mg Oral Daily Gonfa, Taye T, MD        VITAL SIGNS: BP (!) 125/51 (BP Location: Left Arm)   Pulse 81   Temp 98.1 F (36.7 C) (Oral)   Resp (!) 24   Ht '5\' 11"'$  (1.803 m)   Wt 72.1 kg   SpO2 96%   BMI 22.17 kg/m  Filed Weights   09/11/19 1900 09/13/19 0418  Weight: 71.5 kg 72.1 kg    Estimated body mass index is 22.17 kg/m as calculated from the  following:   Height as of this encounter: '5\' 11"'$  (1.803 m).   Weight as of this encounter: 72.1 kg.  LABS: CBC:    Component Value Date/Time   WBC 21.5 (H) 09/13/2019 0925   HGB 11.7 (L) 09/13/2019 0925   HGB 14.3 10/23/2014 1136   HCT 35.9 (L) 09/13/2019 0925   HCT 39.4 08/18/2015 2001   HCT 42.0 10/23/2014 1136   PLT 229 09/13/2019 0925   PLT 156 10/23/2014 1136   Comprehensive Metabolic Panel:    Component Value Date/Time   NA 140 09/13/2019 0925   NA 140 10/23/2014 1136   K 3.4 (L) 09/13/2019 0925   K 4.7 10/23/2014 1136   CO2 23 09/13/2019 0925   CO2 28 10/23/2014 1136   BUN 20 09/13/2019 0925   BUN 14.5 10/23/2014 1136   CREATININE 0.71 09/13/2019 0925   CREATININE 0.7 10/23/2014 1136   ALBUMIN 1.6 (L) 09/13/2019 0925   ALBUMIN 1.6 (L) 09/13/2019 0925   ALBUMIN 3.3 (L) 10/23/2014 1136     Review of Systems  Constitutional: Positive for appetite change.  Neurological: Positive for weakness.  Unless otherwise noted, a complete review of systems is negative.  Physical Exam General: NAD, frail chronically-ill appearing Cardiovascular: regular rate and rhythm Pulmonary: clear ant fields Abdomen: soft, nontender, + bowel sounds Skin: pressure ulceration to left heel Neurological: awake, A&O x3,  mood appropriate   Prognosis: < 6 months in the setting of severe sepsis due to bacteremia, UTI, aspiration pneumonia, chronic discitis refusing antibiotic therapy in home, uncontrolled diabetes, a-fib, bedbound, deconditioned, generalized weakness, CVA, troponin elevated 7365, patient requesting no aggressive treatment or interventions.   Discharge Planning:  Home with Hospice currently under the care of [facility] (Palliative) would like to continue with their team.   Recommendations:  DNR/DNI-as confirmed by patient and daughter  MOST form completed and placed on chart (DNR, Comfort, No PEG or IVF, antibiotics if indicated)  Continue with current plan of care per medical team. No escalation of care or aggressive interventions. Does not wish to transition to comfort while hospitalized. Goal of medical stability and transfer home with hospice with a comfort care approach.   Currently established with [facility], would like to continue with their services and transition from palliative to hospice (referral placed)  Left heel ulcer Prevalon Boot for support.   Patient request assistance completing AD. Spiritual Care consult placed.   Would recommend Roxanol 5-'10mg'$  every 2hrs PRN for pain/discomfort/air hunger at discharge in the setting of comfort focus. Ativan 0.'5mg'$ -'1mg'$  every 6hrs PRN for anxiety/agitation.   PMT will continue to support and follow      Palliative Performance Scale: PPS 20-30 %              Patient and daughter, [name] expressed understanding and was in agreement with this plan.   Thank you for allowing the Palliative Medicine Team to assist in the care of this patient.  Time In: 1300 Time Out: 1415 Time Total: 75 min.   Visit consisted of counseling and education dealing with the complex and emotionally intense issues of symptom management and palliative care in the setting of serious and potentially life-threatening illness.Greater than 50%  of this time was  spent counseling and coordinating care related to the above assessment and plan.  Signed by:  Alda Lea, AGPCNP-BC Palliative Medicine Team  Phone: [phone] Fax: [phone] Pager: [phone] Amion: Bjorn Pippin

## 2019-09-13 NOTE — Progress Notes (Signed)
Inpatient Diabetes Program Recommendations  AACE/ADA: New Consensus Statement on Inpatient Glycemic Control (2015)  Target Ranges:  Prepandial:   less than 140 mg/dL      Peak postprandial:   less than 180 mg/dL (1-2 hours)      Critically ill patients:  140 - 180 mg/dL   Lab Results  Component Value Date   GLUCAP 180 (H) 09/13/2019   HGBA1C 7.6 (H) 02/09/2019    Review of Glycemic Control Results for [name], [name] (MRN [id number]) as of 09/13/2019 12:08  Ref. Range 09/12/2019 09:08 09/12/2019 16:33 09/12/2019 21:19 09/12/2019 22:01 09/13/2019 06:01 09/13/2019 06:30 09/13/2019 11:15  Glucose-Capillary Latest Ref Range: 70 - 99 mg/dL 84 100 (H) 67 (L) 94 69 (L) 108 (H) 180 (H)   Diabetes history: DM 2 Outpatient Diabetes medications: Tresiba 14 units Current orders for Inpatient glycemic control:  Lantus 8 units Novolog 0-9 units tid  Inpatient Diabetes Program Recommendations:    Hypoglycemia. Consider decreasing Lantus to 5-6 units.  Thanks,  Tama Headings RN, MSN, BC-ADM Inpatient Diabetes Coordinator Team Pager [phone] (8a-5p)

## 2019-09-14 LAB — RENAL FUNCTION PANEL
Albumin: 1.5 g/dL — ABNORMAL LOW (ref 3.5–5.0)
Anion gap: 9 (ref 5–15)
BUN: 14 mg/dL (ref 8–23)
CO2: 25 mmol/L (ref 22–32)
Calcium: 8 mg/dL — ABNORMAL LOW (ref 8.9–10.3)
Chloride: 107 mmol/L (ref 98–111)
Creatinine, Ser: 0.55 mg/dL — ABNORMAL LOW (ref 0.61–1.24)
GFR calc Af Amer: >60 mL/min (ref >60)
GFR calc non Af Amer: >60 mL/min (ref >60)
Glucose, Bld: 74 mg/dL (ref 70–99)
Phosphorus: 3 mg/dL (ref 2.5–4.6)
Potassium: 2.8 mmol/L — ABNORMAL LOW (ref 3.5–5.1)
Sodium: 141 mmol/L (ref 135–145)

## 2019-09-14 LAB — GLUCOSE, CAPILLARY
Glucose-Capillary: 70 mg/dL (ref 70–99)
Glucose-Capillary: 220 mg/dL — ABNORMAL HIGH (ref 70–99)
Glucose-Capillary: 217 mg/dL — ABNORMAL HIGH (ref 70–99)
Glucose-Capillary: 247 mg/dL — ABNORMAL HIGH (ref 70–99)

## 2019-09-14 LAB — CK: Total CK: 60 U/L (ref 49–397)

## 2019-09-14 LAB — CBC
HCT: 33.9 % — ABNORMAL LOW (ref 39.0–52.0)
Hemoglobin: 11 g/dL — ABNORMAL LOW (ref 13.0–17.0)
MCH: 26.8 pg (ref 26.0–34.0)
MCHC: 32.4 g/dL (ref 30.0–36.0)
MCV: 82.7 fL (ref 80.0–100.0)
Platelets: 231 10*3/uL (ref 150–400)
RBC: 4.1 MIL/uL — ABNORMAL LOW (ref 4.22–5.81)
RDW: 15.9 % — ABNORMAL HIGH (ref 11.5–15.5)
WBC: 17.1 10*3/uL — ABNORMAL HIGH (ref 4.0–10.5)
nRBC: 0 % (ref 0.0–0.2)

## 2019-09-14 LAB — CULTURE, BLOOD (ROUTINE X 2)

## 2019-09-14 LAB — HEMOGLOBIN A1C
Hgb A1c MFr Bld: 6.9 % — ABNORMAL HIGH (ref 4.8–5.6)
Mean Plasma Glucose: 151.33 mg/dL

## 2019-09-14 LAB — MAGNESIUM: Magnesium: 1.6 mg/dL — ABNORMAL LOW (ref 1.7–2.4)

## 2019-09-14 LAB — HEPARIN LEVEL (UNFRACTIONATED): Heparin Unfractionated: <0.1 IU/mL — ABNORMAL LOW (ref 0.30–0.70)

## 2019-09-14 LAB — PROCALCITONIN: Procalcitonin: 2.98 ng/mL

## 2019-09-14 MED ORDER — MAGNESIUM SULFATE 2 GM/50ML IV SOLN
2.0000 g | Freq: Once | INTRAVENOUS | Status: AC
  Administered 2019-09-14: 2 g via INTRAVENOUS
  Filled 2019-09-14: qty 50

## 2019-09-14 MED ORDER — POTASSIUM CHLORIDE CRYS ER 20 MEQ PO TBCR
40.0000 meq | EXTENDED_RELEASE_TABLET | ORAL | Status: AC
  Administered 2019-09-14: 40 meq via ORAL
  Filled 2019-09-14 (×2): qty 2

## 2019-09-14 MED ORDER — LOPERAMIDE HCL 2 MG PO CAPS
2.0000 mg | ORAL_CAPSULE | ORAL | Status: DC | PRN
  Administered 2019-09-14: 2 mg via ORAL
  Filled 2019-09-14 (×2): qty 1

## 2019-09-14 MED ORDER — SULFAMETHOXAZOLE-TRIMETHOPRIM 800-160 MG PO TABS
1.0000 | ORAL_TABLET | Freq: Two times a day (BID) | ORAL | Status: DC
  Administered 2019-09-14 – 2019-09-16 (×4): 1 via ORAL
  Filled 2019-09-14 (×4): qty 1

## 2019-09-14 NOTE — Progress Notes (Signed)
[facility])   Referral received for hospice services at home once discharged. Spoke with daughter [name] explained services and offer support. Chart and patient information under review by [facility] physician. Hospice eligibility pending currently.   Plan is to d/c home once medically stable.   Currently has no DME needs. Patient has a hospital bed, walker and W/C. Daughter requested an over the bed table.   Please send completed DNR and arrange for any comfort scripts that may be needed for symptom management so there is no lapse in patient comfort prior to hospice services beginning.  [facility] aware of the above. Please notify [facility] when patient is ready to leave the unit at discharge. (Call [phone] or [phone] after 5pm.) [facility] information and contact numbers given to  [facility].       Farrel Gordon, RN  [facility] Liaison (in [place])  [phone]

## 2019-09-14 NOTE — Progress Notes (Signed)
Daily Progress Note   Patient Name: [name]       Date: 09/14/2019 DOB: February 05, 1941  Age: 79 y.o. MRN#: [id number] Attending Physician: Mercy Riding, MD Primary Care Physician: Aurea Graff.Marlou Sa, MD Admit Date: 09/11/2019  Reason for Consultation/Follow-up: Establishing goals of care  Subjective: Patient in bed watching TV. Daughter is at the bedside. Mr. Vander denies pain currently but does endorse some discomfort in left foot at times. Denies shortness of breath. States appetite has been ok.   We reviewed our goals of care discussion on yesterday. Patient and daughter confirmed set goals with plans to discharge home with hospice support. [name] shares she has spoken with the Case Freight forwarder and a representative from [employer]. Patient verbalized he is hopeful to get this coming weekend.   Goals are set. All questions answered and support given.   Length of Stay: 3  Current Medications: Scheduled Meds:  . aspirin  81 mg Oral Q24H  . clopidogrel  75 mg Oral Daily  . insulin aspart  0-9 Units Subcutaneous TID WC  . insulin glargine  8 Units Subcutaneous Daily  . lactose free nutrition  237 mL Oral TID WC  . metoprolol tartrate  25 mg Oral BID  . multivitamin with minerals  1 tablet Oral Daily  . potassium chloride  40 mEq Oral Q4H  . simvastatin  20 mg Oral q1600  . sulfamethoxazole-trimethoprim  1 tablet Oral Q12H  . tamsulosin  0.4 mg Oral Daily    Continuous Infusions: . ceFEPime (MAXIPIME) IV 2 g (09/14/19 1238)    PRN Meds: acetaminophen **OR** acetaminophen, hydrALAZINE, loperamide, ondansetron **OR** ondansetron (ZOFRAN) IV  Physical Exam       -awake, A&O x3, chronically-ill appearing -breathing without difficulty -left heel dressing dry, intact     Vital  Signs: BP (!) 131/58 (BP Location: Left Arm)   Pulse 71   Temp (!) 97.4 F (36.3 C) (Oral)   Resp 20   Ht 5\' 11"  (1.803 m)   Wt 71.3 kg   SpO2 98%   BMI 21.92 kg/m  SpO2: SpO2: 98 % O2 Device: O2 Device: Room Air O2 Flow Rate: O2 Flow Rate (L/min): 3 L/min  Intake/output summary:   Intake/Output Summary (Last 24 hours) at 09/14/2019 1336 Last data filed at 09/14/2019 0035 Gross per 24 hour  Intake 395 ml  Output -  Net 395 ml   LBM: Last BM Date: 09/14/19 Baseline Weight: Weight: 71.5 kg Most recent weight: Weight: 71.3 kg       Palliative Assessment/Data:      Patient Active Problem List   Diagnosis Date Noted  . Protein-calorie malnutrition, severe 09/13/2019  . Acute lower UTI 09/12/2019  . Sepsis ([place]) 09/11/2019  . Medication monitoring encounter 06/01/2019  . Hypoglycemia 06/01/2019  . Pressure injury of skin 05/01/2019  . Malnutrition of moderate degree 05/01/2019  . Discitis of lumbosacral region 04/30/2019  . Weakness generalized 02/23/2019  . Lumbar compression fracture ([place]) 02/08/2019  . Closed compression fracture of L3 lumbar vertebra, initial encounter ([place]) 02/08/2019  . Laryngitis   . Sore throat   . Cough   . Overflow incontinence of urine   . Urinary retention   . Urinary urgency   . Hypoalbuminemia due to protein-calorie malnutrition ([place])   . 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 Benign essential HTN   . H/O urethral stricture   . Acute blood loss anemia   . Poorly controlled type 2 diabetes mellitus with peripheral neuropathy ([place])   . Debility 06/28/2017  . Personal history of urethral stricture 06/23/2017  . Rhabdomyolysis 06/23/2017  . Near syncope 09/23/2015  . PAF (paroxysmal atrial fibrillation) ([place]) 09/23/2015  . Diabetes mellitus type 2, uncontrolled ([place]) 09/23/2015  . 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 Vertebrobasilar artery syndrome   . Orthostatic hypotension   . Insulin dependent diabetes mellitus    . Falls frequently   . Weakness 08/15/2015  . Encephalopathy 05/29/2014  . Recurrent falls 09/16/2013  . History of CVA (cerebrovascular accident) 09/16/2013  . Leukocytosis 09/16/2013  . Thrombocytopenia ([place]) 09/16/2013  . B-cell lymphoma ([place]) 10/09/2011  . Diabetes mellitus ([place]) 10/09/2011  . Hypertension 10/09/2011  . A-fib [facility])     Palliative Care Assessment & Plan   Recommendations/Plan: Continue current plan of care per medical team Hospice support at discharge.  Prevalon boot to left foot (notified RN) Would recommend Roxanol 5-10mg  every 2hrs PRN for pain/discomfort/air hunger at discharge in the setting of comfort focus. Ativan 0.5mg -1mg  every 6hrs PRN for anxiety/agitation.  PMT will continue to support and follow   Goals of Care and Additional Recommendations: Limitations on Scope of Treatment: Avoid Hospitalization, No Blood Transfusions, No Diagnostics and DNR/DNI  Code Status:    Code Status Orders  (From admission, onward)         Start     Ordered   09/12/19 0108  Do not attempt resuscitation (DNR)  Continuous    Question Answer Comment  In the event of cardiac or respiratory ARREST Do not call a "code blue"   In the event of cardiac or respiratory ARREST Do not perform Intubation, CPR, defibrillation or ACLS   In the event of cardiac or respiratory ARREST Use medication by any route, position, wound care, and other measures to relive pain and suffering. May use oxygen, suction and manual treatment of airway obstruction as needed for comfort.      09/12/19 0108        Code Status History    Date Active Date Inactive Code Status Order ID Comments User Context   09/11/2019 2256 09/12/2019 0108 DNR [id number]  Rise Patience, MD ED   04/30/2019 1112 05/07/2019 0017 DNR [id number]  Jean Rosenthal, MD ED   03/21/2019 0702 03/24/2019 1841 Full Code [id number]  Gean Birchwood  Delane Ginger, MD ED   02/23/2019 2253 03/03/2019 1744 Full Code [id number]  Elwyn Reach, MD Inpatient   02/09/2019 0022 02/10/2019 1816 Full Code [id number]  Rise Patience, MD Inpatient   06/28/2017 1703 07/09/2017 1441 Full Code [id number]  Cathlyn Parsons, PA-C Inpatient   06/28/2017 1703 06/28/2017 1703 Full Code [id number]  Cathlyn Parsons, PA-C Inpatient   06/23/2017 0026 06/28/2017 1641 Full Code [id number]  Norval Morton, MD ED   09/23/2015 1001 09/25/2015 1911 Full Code [id number]  Rise Patience, MD ED   08/15/2015 1941 08/19/2015 1621 Full Code [id number]  Elgergawy, Silver Huguenin, MD Inpatient   05/29/2014 2351 06/05/2014 1702 Full Code [id number]  Shanda Howells, MD ED   09/16/2013 1605 09/18/2013 1858 Full Code [id number]  Caren Griffins, MD ED   Advance Care Planning Activity    Advance Directive Documentation     Most Recent Value  Type of Advance Directive  Out of facility DNR (pink MOST or yellow form)  Pre-existing out of facility DNR order (yellow form or pink MOST form)  -  "MOST" Form in Place?  -      Prognosis:  < 6 months  Discharge Planning: Home with Hospice  Care plan was discussed with patient, daughter, and RN.   Thank you for allowing the Palliative Medicine Team to assist in the care of this patient.  Time Total:20 min.   Visit consisted of counseling and education dealing with the complex and emotionally intense issues of symptom management and palliative care in the setting of serious and potentially life-threatening illness.Greater than 50%  of this time was spent counseling and coordinating care related to the above assessment and plan.  Alda Lea, AGPCNP-BC  Palliative Medicine Team [phone]   Please contact Palliative Medicine Team phone at [phone] for questions and concerns.

## 2019-09-14 NOTE — TOC Initial Note (Addendum)
Transition of Care (TOC) - Initial/Assessment Note  Marvetta Gibbons RN, BSN Transitions of Care Unit 4E- RN Case Manager [phone]   Patient Details  Name: [name] MRN: [id number] Date of Birth: Jan 24, 1941  Transition of Care [facility]) CM/SW Contact:    [name], RN Phone Number: 09/14/2019, 11:49 AM  Clinical Narrative:                 Pt from home with daughter, bed bound, has hospital bed at home. PC consulted for [place]. Per PC note pt active with [facility] for palliative care PTA and wants to continue with [facility] with change over to Hospice services on discharge. Referral made per PC. GOLD DNR and MOST form placed on shadow chart by PC. Pt will need PTAR transport home when medically stable. Call made to Audrea Muscat with [facility] regarding Home Hospice at discharge.  Expected Discharge Plan: Home w Hospice Care Barriers to Discharge: Continued Medical Work up   Patient Goals and CMS Choice Patient states their goals for this hospitalization and ongoing recovery are:: return home with [facility] under Hospice CMS Medicare.gov Compare Post Acute Care list provided to:: Patient Choice offered to / list presented to : Patient, Adult Children(Per PC- pt already active with [facility] and wants to stay wtih them)  Expected Discharge Plan and Services Expected Discharge Plan: [place]   Discharge Planning Services: CM Consult Post Acute Care Choice: Resumption of Svcs/PTA Provider, Hospice Living arrangements for the past 2 months: [place]: Hospice and [facility]        Prior Living Arrangements/Services Living arrangements for the past 2 months: Single Family Home Lives with:: Adult Children, Self Patient language and need for interpreter reviewed:: Yes Do you feel safe going back to the place where you live?: Yes        Care giver support system in place?: Yes  (comment) Current home services: DME, Other (comment)(Palliative) Criminal Activity/Legal Involvement Pertinent to Current Situation/Hospitalization: No - Comment as needed  Activities of Daily Living      Permission Sought/Granted Permission sought to share information with : Facility Sport and exercise psychologist                Emotional Assessment Appearance:: Appears stated age Attitude/Demeanor/Rapport: Engaged Affect (typically observed): Appropriate Orientation: : Oriented to Place, Oriented to  Time, Oriented to Situation Alcohol / Substance Use: Not Applicable Psych Involvement: No (comment)  Admission diagnosis:  Osteomyelitis ([place]) [M86.9] Septic shock (HCC) [A41.9, R65.21] Sepsis ([place]) [A41.9] Urinary tract infection without hematuria, site unspecified [N39.0] Patient Active Problem List   Diagnosis Date Noted  . Protein-calorie malnutrition, severe 09/13/2019  . Acute lower UTI 09/12/2019  . Sepsis ([place]) 09/11/2019  . Medication monitoring encounter 06/01/2019  . Hypoglycemia 06/01/2019  . Pressure injury of skin 05/01/2019  . Malnutrition of moderate degree 05/01/2019  . Discitis of lumbosacral region 04/30/2019  . Weakness generalized 02/23/2019  . Lumbar compression fracture ([place]) 02/08/2019  . Closed compression fracture of L3 lumbar vertebra, initial encounter ([place]) 02/08/2019  . Laryngitis   . Sore throat   . Cough   . Overflow incontinence of urine   . Urinary retention   . Urinary urgency   . Hypoalbuminemia due to protein-calorie malnutrition ([place])   . Type 2 diabetes mellitus with  peripheral neuropathy ([place])   . Benign essential HTN   . H/O urethral stricture   . Acute blood loss anemia   . Poorly controlled type 2 diabetes mellitus with peripheral neuropathy ([place])   . Debility 06/28/2017  . Personal history of urethral stricture 06/23/2017  . Rhabdomyolysis 06/23/2017  . Near syncope 09/23/2015  . PAF (paroxysmal atrial fibrillation) ([place])  09/23/2015  . Diabetes mellitus type 2, uncontrolled ([place]) 09/23/2015  . Stenosis of artery ([place])   . Vertebrobasilar artery syndrome   . Orthostatic hypotension   . Insulin dependent diabetes mellitus   . Falls frequently   . Weakness 08/15/2015  . Encephalopathy 05/29/2014  . Recurrent falls 09/16/2013  . History of CVA (cerebrovascular accident) 09/16/2013  . Leukocytosis 09/16/2013  . Thrombocytopenia ([place]) 09/16/2013  . 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 Diabetes mellitus ([place]) 10/09/2011  . Hypertension 10/09/2011  . A-fib [facility])    PCP:  Alroy Dust, L.Marlou Sa, MD Pharmacy:   [facility], [address] [place] [address] Phone: [phone] Fax: [phone]  [id number] [address], [address] [place] [address] Phone: [phone] Fax: [phone]     Social Determinants of Health ([place]) Interventions    Readmission Risk Interventions Readmission Risk Prevention Plan 03/23/2019  Transportation Screening Complete  PCP or Specialist Appt within 3-5 Days Not Complete  Not Complete comments SNF placement planned  Some recent data might be hidden

## 2019-09-14 NOTE — Progress Notes (Signed)
PROGRESS NOTE  [name] [id number] DOB: 11-Jul-1940   PCP: Alroy Dust, L.Marlou Sa, MD  Patient is from: Home.  Bedbound at baseline.  DOA: 09/11/2019 LOS: 3  Brief Narrative / Interim history: 79 year old male with history of chronic discitis on chronic suppressive antibiotic therapy that he has not taken for 6 weeks, debility/bedbound, DM-2, CVA, paroxysmal A. fib and recurrent UTI presenting with puslike urine for 2 weeks, and fever and chills for 1 day.  Admitted for severe sepsis due to UTI.  In ED, tachycardic to 130s.  Hypotensive.  Febrile to 104.  WBC 13> 19.  Lactic acid 4.3> 10> 1.1.  UA concerning for UTI.  CXR with bronchitic changes.  EKG sinus tachycardia with PACs, PVC and RBBB.  Resuscitated with IV fluid with improvement in his vital signs.  Cultures obtained.  Started on antibiotics and admitted for severe sepsis likely due to UTI.  CT abdomen and pelvis with increased bilateral hydronephrosis and hydroureter potentially related to bladder distention due to possible outlet obstruction, extensive bone destruction of the endplates of 624THL suspicious for discitis, bibasilar atelectasis with mild infiltrate, right> left.  Troponin 7365 which was thought to be demand ischemia from severe sepsis in the absence of cardiopulmonary symptoms.  Cardiology consulted and discontinued IV heparin.  Urology consulted for increased bilateral hydronephrosis and urinary retention.  However, patient refused urologic intervention including Foley.  Blood and urine culture with Citrobacter freundii sensitive to cefepime.  Vancomycin and Flagyl discontinued.  Palliative care consulted.  Goals of care addressed.  Patient to be discharged home with home hospice but daughter will not be at home to look after him until 09/16/2019  Subjective: Seen and examined earlier this morning.  No major events overnight of this morning.  He denies chest pain, dyspnea, GI or UTI symptoms.  Potassium 2.8 this  morning.  Refusing potassium out of concern for diarrhea. He voiced understanding the danger of hypokalemia including cardiac arrest and death.  He says "I do not care".  However, I agreed to try with Imodium at the end.  Objective: Vitals:   09/14/19 0340 09/14/19 0626 09/14/19 0810 09/14/19 1205  BP:   (!) 133/59 (!) 131/58  Pulse: 79  80 71  Resp: 19 19 18 20   Temp: 97.6 F (36.4 C)  97.9 F (36.6 C) (!) 97.4 F (36.3 C)  TempSrc: Oral  Oral Oral  SpO2: 95%  98% 98%  Weight:  71.3 kg    Height:        Intake/Output Summary (Last 24 hours) at 09/14/2019 1541 Last data filed at 09/14/2019 1300 Gross per 24 hour  Intake 755 ml  Output --  Net 755 ml   Filed Weights   09/11/19 1900 09/13/19 0418 09/14/19 0626  Weight: 71.5 kg 72.1 kg 71.3 kg    Examination:  GENERAL: No apparent distress.  Nontoxic. HEENT: MMM.  Vision and hearing grossly intact.  NECK: Supple.  No apparent JVD.  RESP:  No IWOB. Good air movement bilaterally. CVS:  RRR. Heart sounds normal.  ABD/GI/GU: Bowel sounds present. Soft. Non tender.  MSK/EXT:  Moves extremities. No apparent deformity.  Significant muscle mass and subcu fat loss. SKIN: Pressure injury over left heel. NEURO: Awake, alert and oriented appropriately.  No apparent focal neuro deficit. PSYCH: Calm. Normal affect.  Procedures:  None  Assessment & Plan: Severe sepsis due to Citrobacter bacteremia and UTI-aspiration pneumonia unlikely without cardiopulmonary symptoms.  Sepsis physiology resolving except leukocytosis.  Blood and urine culture with  Citrobacter freundii-sensitive to cefepime and Bactrim.  Procalcitonin downtrending. -Vanco 3/22-3/23.  Flagyl 3/22-3/24.  Cefepime 3/22-3/25. -Bactrim 3/25>> -Mild aspiration risk per SLP.  Acute on chronic bilateral hydronephrosis and patient with history of urethral stricture-CT abdomen and pelvis shows increased bilateral hydro with bladder distention likely due to outlet obstruction and  urethral stricture. -Refused intervention by urology including Foley catheter.  Lactic acidosis: Likely due to sepsis.  Resolved.  Elevated troponin-likely demand ischemia from severe sepsis.  EKG with sinus tachycardia, PACs, PVCs and RBBB but no acute ischemic finding.  No cardiopulmonary symptoms. -Continue metoprolol, statin, Plavix and aspirin. -Cardiology signed off.  History of chronic discitis on suppressive antibiotics-reportedly has not taken his antibiotics in about 6 weeks. -Continue antibiotics as above -Pain control  Uncontrolled DM-2 with hyperglycemia: A1c 7.6% in 01/2019. Recent Labs    09/13/19 2116 09/14/19 0625 09/14/19 1201  GLUCAP 112* 70 220*  -Continue Lantus and SSI -Continue statin -Recheck hemoglobin A1c  Paroxysmal A. fib: On metoprolol for rate control.  Not on anticoagulation. -Continue metoprolol  Left heel pressure injury: POA-x-ray without evidence of osteomyelitis. -Continue pressure offloading.  Hypokalemia-refusing K-Dur out of concern for diarrhea.  Understand the risk.  -K-Dur ordered.  We will try Imodium to see if it helps with diarrhea  Hypomagnesemia -Replenish and recheck.  Bandemia-likely due to sepsis.  Improving. -Continue trending -Treatment as above.  History of CVA: Stable -Continue home medications-statin, Plavix and aspirin  Goal of care: per patient's daughter he turned away [facility], palliative and hospice in the past. He said "I don't give a damn thing".  He also refused urologic intervention (Foley insertion) by urology.  Also refusing potassium supplementations.  He is DNR and DNI appropriately.  -Appreciate palliative care help-plan for discharge home with home hospice with no future hospitalization  Nutrition Problem: Severe Malnutrition Etiology: chronic illness([place]oke w/ dysphagia)  Signs/Symptoms: energy intake < or equal to 75% for > or equal to 1 month, severe muscle depletion, mild fat depletion  Interventions:  Boost Plus, MVI   DVT prophylaxis: Heparin drip Code Status: DNR/DNI Family Communication: updated his daughter, [name] over the phone on 3/24  Discharge barrier: Safe disposition.  Patient to go home with home hospice but daughter will not be home until 09/16/2019 Patient is from: Home Final disposition: Likely home daughter and home hospice on 3/27  Consultants:  Cardiology (off) Urology (off) Palliative care   Microbiology summarized: [id number] negative so far Blood cultures Citrobacter freundii Urine Culture Citrobacter freundii  Sch Meds:  Scheduled Meds: . aspirin  81 mg Oral Q24H  . clopidogrel  75 mg Oral Daily  . insulin aspart  0-9 Units Subcutaneous TID WC  . insulin glargine  8 Units Subcutaneous Daily  . lactose free nutrition  237 mL Oral TID WC  . metoprolol tartrate  25 mg Oral BID  . multivitamin with minerals  1 tablet Oral Daily  . potassium chloride  40 mEq Oral Q4H  . simvastatin  20 mg Oral q1600  . sulfamethoxazole-trimethoprim  1 tablet Oral Q12H  . tamsulosin  0.4 mg Oral Daily   Continuous Infusions: . ceFEPime (MAXIPIME) IV 2 g (09/14/19 1238)   PRN Meds:.acetaminophen **OR** acetaminophen, hydrALAZINE, loperamide, ondansetron **OR** ondansetron (ZOFRAN) IV  Antimicrobials: Anti-infectives (From admission, onward)   Start     Dose/Rate Route Frequency Ordered Stop   09/14/19 2200  sulfamethoxazole-trimethoprim (BACTRIM DS) 800-160 MG per tablet 1 tablet     1 tablet Oral Every 12 hours 09/14/19 1231  09/18/19 2159   09/12/19 2000  vancomycin (VANCOREADY) IVPB 1750 mg/350 mL  Status:  Discontinued     1,750 mg 175 mL/hr over 120 Minutes Intravenous Every 24 hours 09/11/19 2025 09/12/19 1641   09/12/19 1400  metroNIDAZOLE (FLAGYL) IVPB 500 mg  Status:  Discontinued     500 mg 100 mL/hr over 60 Minutes Intravenous Every 8 hours 09/12/19 0805 09/13/19 1519   09/12/19 0815  metroNIDAZOLE (FLAGYL) IVPB 500 mg     500 mg 100 mL/hr over 60 Minutes  Intravenous  Once 09/12/19 0807 09/12/19 1205   09/12/19 0400  ceFEPIme (MAXIPIME) 2 g in sodium chloride 0.9 % 100 mL IVPB     2 g 200 mL/hr over 30 Minutes Intravenous Every 8 hours 09/11/19 2025 09/15/19 0359   09/11/19 1930  ceFEPIme (MAXIPIME) 2 g in sodium chloride 0.9 % 100 mL IVPB     2 g 200 mL/hr over 30 Minutes Intravenous  Once 09/11/19 1917 09/11/19 2103   09/11/19 1930  vancomycin (VANCOCIN) IVPB 1000 mg/200 mL premix     1,000 mg 200 mL/hr over 60 Minutes Intravenous  Once 09/11/19 1917 09/11/19 2100       I have personally reviewed the following labs and images: CBC: Recent Labs  Lab 09/11/19 1912 09/12/19 0518 09/12/19 0737 09/13/19 0925 09/14/19 0338  WBC 13.2* 19.0* 18.0* 21.5* 17.1*  NEUTROABS 11.7*  --  14.3*  --   --   HGB 14.3 11.8* 11.8* 11.7* 11.0*  HCT 46.0 37.6* 37.6* 35.9* 33.9*  MCV 85.8 85.6 86.0 83.1 82.7  PLT 252 201 223 229 231   BMP &GFR Recent Labs  Lab 09/11/19 1912 09/12/19 0518 09/12/19 0737 09/13/19 0925 09/14/19 0338  NA 140  --  141 140 141  K 4.1  --  3.4* 3.4* 2.8*  CL 101  --  107 106 107  CO2 23  --  26 23 25   GLUCOSE 191*  --  109* 198* 74  BUN 30*  --  26* 20 14  CREATININE 0.77 0.58* 0.58* 0.71 0.55*  CALCIUM 8.2*  --  7.8* 7.8* 8.0*  MG  --   --   --  1.7 1.6*  PHOS  --   --   --  3.0 3.0   Estimated Creatinine Clearance: 76.7 mL/min (A) (by C-G formula based on SCr of 0.55 mg/dL (L)). Liver & Pancreas: Recent Labs  Lab 09/11/19 1912 09/12/19 0737 09/13/19 0925 09/14/19 0338  AST 29 50* 50*  --   ALT 23 21 22   --   ALKPHOS 68 58 67  --   BILITOT 0.5 0.6 0.5  --   PROT 6.2* 5.1* 5.6*  --   ALBUMIN 2.0* 1.7* 1.6*  1.6* 1.5*   No results for input(s): LIPASE, AMYLASE in the last 168 hours. No results for input(s): AMMONIA in the last 168 hours. Diabetic: Recent Labs    09/14/19 0338  HGBA1C 6.9*   Recent Labs  Lab 09/13/19 1115 09/13/19 1608 09/13/19 2116 09/14/19 0625 09/14/19 1201  GLUCAP  180* 73 112* 70 220*   Cardiac Enzymes: Recent Labs  Lab 09/14/19 0338  CKTOTAL 60   No results for input(s): PROBNP in the last 8760 hours. Coagulation Profile: No results for input(s): INR, PROTIME in the last 168 hours. Thyroid Function Tests: No results for input(s): TSH, T4TOTAL, FREET4, T3FREE, THYROIDAB in the last 72 hours. Lipid Profile: No results for input(s): CHOL, HDL, LDLCALC, TRIG, CHOLHDL, LDLDIRECT in the last  72 hours. Anemia Panel: No results for input(s): VITAMINB12, FOLATE, FERRITIN, TIBC, IRON, RETICCTPCT in the last 72 hours. Urine analysis:    Component Value Date/Time   COLORURINE YELLOW 09/11/2019 2139   APPEARANCEUR TURBID (A) 09/11/2019 2139   LABSPEC 1.014 09/11/2019 2139   PHURINE 5.0 09/11/2019 2139   GLUCOSEU NEGATIVE 09/11/2019 2139   HGBUR LARGE (A) 09/11/2019 2139   BILIRUBINUR NEGATIVE 09/11/2019 2139   KETONESUR NEGATIVE 09/11/2019 2139   PROTEINUR 100 (A) 09/11/2019 2139   UROBILINOGEN 0.2 05/29/2014 2125   NITRITE NEGATIVE 09/11/2019 2139   LEUKOCYTESUR LARGE (A) 09/11/2019 2139   Sepsis Labs: Invalid input(s): PROCALCITONIN, [place]  Microbiology: Recent Results (from the past 240 hour(s))  Blood Culture (routine x 2)     Status: Abnormal   Collection Time: 09/11/19  7:12 PM   Specimen: BLOOD  Result Value Ref Range Status   Specimen Description BLOOD SITE NOT SPECIFIED  Final   Special Requests   Final    BOTTLES DRAWN AEROBIC AND ANAEROBIC Blood Culture results may not be optimal due to an inadequate volume of blood received in culture bottles Performed at [facility] [address], [place], [place] [address]    Culture  Setup Time   Final    GRAM NEGATIVE RODS AEROBIC BOTTLE ONLY CRITICAL RESULT CALLED TO, READ BACK BY AND VERIFIED WITH: PHARMD JEREMY F. [id number] FCP    Culture CITROBACTER FREUNDII (A)  Final   Report Status 09/14/2019 FINAL  Final   Organism ID, Bacteria CITROBACTER FREUNDII  Final       Susceptibility   Citrobacter freundii - MIC*    CEFAZOLIN >=64 RESISTANT Resistant     CEFEPIME 2 SENSITIVE Sensitive     CEFTAZIDIME >=64 RESISTANT Resistant     CEFTRIAXONE >=64 RESISTANT Resistant     CIPROFLOXACIN <=0.25 SENSITIVE Sensitive     GENTAMICIN <=1 SENSITIVE Sensitive     IMIPENEM <=0.25 SENSITIVE Sensitive     TRIMETH/SULFA <=20 SENSITIVE Sensitive     PIP/TAZO 32 INTERMEDIATE Intermediate     * CITROBACTER FREUNDII  Blood Culture ID Panel (Reflexed)     Status: Abnormal   Collection Time: 09/11/19  7:12 PM  Result Value Ref Range Status   Enterococcus species NOT DETECTED NOT DETECTED Final   Listeria monocytogenes NOT DETECTED NOT DETECTED Final   Staphylococcus species NOT DETECTED NOT DETECTED Final   Staphylococcus aureus (BCID) NOT DETECTED NOT DETECTED Final   Streptococcus species NOT DETECTED NOT DETECTED Final   Streptococcus agalactiae NOT DETECTED NOT DETECTED Final   Streptococcus pneumoniae NOT DETECTED NOT DETECTED Final   Streptococcus pyogenes NOT DETECTED NOT DETECTED Final   Acinetobacter baumannii NOT DETECTED NOT DETECTED Final   Enterobacteriaceae species DETECTED (A) NOT DETECTED Final    Comment: Enterobacteriaceae represent a large family of gram negative bacteria, not a single organism. Refer to culture for further identification. CRITICAL RESULT CALLED TO, READ BACK BY AND VERIFIED WITH: PHARMD JEREMY F. [id number] FCP    Enterobacter cloacae complex NOT DETECTED NOT DETECTED Final   Escherichia coli NOT DETECTED NOT DETECTED Final   Klebsiella oxytoca NOT DETECTED NOT DETECTED Final   Klebsiella pneumoniae NOT DETECTED NOT DETECTED Final   Proteus species NOT DETECTED NOT DETECTED Final   Serratia marcescens NOT DETECTED NOT DETECTED Final   Carbapenem resistance NOT DETECTED NOT DETECTED Final   Haemophilus influenzae NOT DETECTED NOT DETECTED Final   Neisseria meningitidis NOT DETECTED NOT DETECTED Final   Pseudomonas  aeruginosa NOT  DETECTED NOT DETECTED Final   Candida albicans NOT DETECTED NOT DETECTED Final   Candida glabrata NOT DETECTED NOT DETECTED Final   Candida krusei NOT DETECTED NOT DETECTED Final   Candida parapsilosis NOT DETECTED NOT DETECTED Final   Candida tropicalis NOT DETECTED NOT DETECTED Final  Blood Culture (routine x 2)     Status: None (Preliminary result)   Collection Time: 09/11/19  7:45 PM   Specimen: BLOOD RIGHT FOREARM  Result Value Ref Range Status   Specimen Description BLOOD RIGHT FOREARM  Final   Special Requests   Final    BOTTLES DRAWN AEROBIC AND ANAEROBIC Blood Culture results may not be optimal due to an inadequate volume of blood received in culture bottles   Culture  Setup Time   Final    GRAM NEGATIVE RODS ANAEROBIC BOTTLE ONLY CRITICAL VALUE NOTED.  VALUE IS CONSISTENT WITH PREVIOUSLY REPORTED AND CALLED VALUE. Performed at [facility], [place] [address], [place], [place] [address]    Culture GRAM NEGATIVE RODS  Final   Report Status PENDING  Incomplete  Urine culture     Status: Abnormal   Collection Time: 09/11/19  9:30 PM   Specimen: In/Out Cath Urine  Result Value Ref Range Status   Specimen Description IN/OUT CATH URINE  Final   Special Requests   Final    NONE Performed at [facility], [place] [address], [place], [place] [address]    Culture >=100,000 COLONIES/mL CITROBACTER FREUNDII (A)  Final   Report Status 09/13/2019 FINAL  Final   Organism ID, Bacteria CITROBACTER FREUNDII (A)  Final      Susceptibility   Citrobacter freundii - MIC*    CEFAZOLIN >=64 RESISTANT Resistant     CEFEPIME 2 SENSITIVE Sensitive     CEFTRIAXONE >=64 RESISTANT Resistant     CIPROFLOXACIN <=0.25 SENSITIVE Sensitive     GENTAMICIN <=1 SENSITIVE Sensitive     IMIPENEM <=0.25 SENSITIVE Sensitive     NITROFURANTOIN <=16 SENSITIVE Sensitive     TRIMETH/SULFA <=20 SENSITIVE Sensitive     PIP/TAZO 32 INTERMEDIATE Intermediate     * >=100,000 COLONIES/mL CITROBACTER FREUNDII   SARS CORONAVIRUS 2 (TAT 6-24 HRS) Nasopharyngeal Nasopharyngeal Swab     Status: None   Collection Time: 09/11/19 10:20 PM   Specimen: Nasopharyngeal Swab  Result Value Ref Range Status   SARS Coronavirus 2 NEGATIVE NEGATIVE Final    Comment: (NOTE) SARS-CoV-2 target nucleic acids are NOT DETECTED. The SARS-CoV-2 RNA is generally detectable in upper and lower respiratory specimens during the acute phase of infection. Negative results do not preclude SARS-CoV-2 infection, do not rule out co-infections with other pathogens, and should not be used as the sole basis for treatment or other patient management decisions. Negative results must be combined with clinical observations, patient history, and epidemiological information. The expected result is Negative. Fact Sheet for Patients: SugarRoll.be Fact Sheet for Healthcare Providers: https://www.woods-mathews.com/ This test is not yet approved or cleared by the [place] FDA and  has been authorized for detection and/or diagnosis of SARS-CoV-2 by FDA under an Emergency Use Authorization (EUA). This EUA will remain  in effect (meaning this test can be used) for the duration of the COVID-19 declaration under Section 56 4(b)(1) of the Act, 21 U.S.C. section 360bbb-3(b)(1), unless the authorization is terminated or revoked sooner. Performed at [facility], [place] [address] [place], [place] [address]     Radiology Studies: No results found.  Kamora Vossler T.  [place]  If 7PM-7AM, please contact night-coverage [email or web address] Password [facility] 09/14/2019, 3:41 PM

## 2019-09-15 DIAGNOSIS — E43 Unspecified severe protein-calorie malnutrition: Secondary | ICD-10-CM

## 2019-09-15 DIAGNOSIS — Z515 Encounter for palliative care: Secondary | ICD-10-CM

## 2019-09-15 LAB — GLUCOSE, CAPILLARY
Glucose-Capillary: 149 mg/dL — ABNORMAL HIGH (ref 70–99)
Glucose-Capillary: 185 mg/dL — ABNORMAL HIGH (ref 70–99)
Glucose-Capillary: 162 mg/dL — ABNORMAL HIGH (ref 70–99)
Glucose-Capillary: 135 mg/dL — ABNORMAL HIGH (ref 70–99)

## 2019-09-15 LAB — CBC
HCT: 34.1 % — ABNORMAL LOW (ref 39.0–52.0)
Hemoglobin: 10.8 g/dL — ABNORMAL LOW (ref 13.0–17.0)
MCH: 27.3 pg (ref 26.0–34.0)
MCHC: 31.7 g/dL (ref 30.0–36.0)
MCV: 86.3 fL (ref 80.0–100.0)
Platelets: 198 10*3/uL (ref 150–400)
RBC: 3.95 MIL/uL — ABNORMAL LOW (ref 4.22–5.81)
RDW: 16 % — ABNORMAL HIGH (ref 11.5–15.5)
WBC: 11.5 10*3/uL — ABNORMAL HIGH (ref 4.0–10.5)
nRBC: 0 % (ref 0.0–0.2)

## 2019-09-15 LAB — RENAL FUNCTION PANEL
Albumin: 1.6 g/dL — ABNORMAL LOW (ref 3.5–5.0)
Anion gap: 13 (ref 5–15)
BUN: 19 mg/dL (ref 8–23)
CO2: 22 mmol/L (ref 22–32)
Calcium: 7.9 mg/dL — ABNORMAL LOW (ref 8.9–10.3)
Chloride: 104 mmol/L (ref 98–111)
Creatinine, Ser: 0.61 mg/dL (ref 0.61–1.24)
GFR calc Af Amer: >60 mL/min (ref >60)
GFR calc non Af Amer: >60 mL/min (ref >60)
Glucose, Bld: 185 mg/dL — ABNORMAL HIGH (ref 70–99)
Phosphorus: 2.9 mg/dL (ref 2.5–4.6)
Potassium: 3.6 mmol/L (ref 3.5–5.1)
Sodium: 139 mmol/L (ref 135–145)

## 2019-09-15 LAB — MAGNESIUM: Magnesium: 1.8 mg/dL (ref 1.7–2.4)

## 2019-09-15 NOTE — Progress Notes (Signed)
I responded to consult for pastoral support. [name] was awake and talking. Daughter, [name] was at bedside. I offered them space to voice concerns. [name] who is hard of hearing said he was looking forward to going to his daughters home. [name] said plan are still being made to receive her dad at home so she can care for him. She also said she is caring for her husband who is having a procedure done. She said has not been sleeping well because of the constant care she has provided to her dad and her husband.  The AD was sitting on counter awaiting Notary. I connected with Volunteer services, However there are no Witness available at this time. [name] shared that she is the next of kin to her father [name] and noted that she desires to speak on his behalf (and [name] agreed) when the time comes for her to decide continued care for him. Also, she asked me to connect with [name] because she has not had the time to connect with him. [name] and [name] asked me to call their pastor to let him know when they plan to be discharged so the pastor can follow up with pastoral support.  I called Daiva Nakayama with [employer] [place] [phone] and informed of the patients plans. I ended visit with words of comfort, ministry of presence and prayer. [name] and [name] were grateful the professional staff and spiritual support.   Palliative care Resident Chaplain  Fidel Levy [phone]

## 2019-09-15 NOTE — Progress Notes (Signed)
PROGRESS NOTE  TYREI VASILIOU [id number] DOB: 18-Apr-1941   PCP: Alroy Dust, L.Marlou Sa, MD  Patient is from: Home.  Bedbound at baseline.  DOA: 09/11/2019 LOS: 4  Brief Narrative / Interim history: 79 year old male with history of chronic discitis on chronic suppressive antibiotic therapy that he has not taken for 6 weeks, debility/bedbound, DM-2, CVA, paroxysmal A. fib and recurrent UTI presenting with puslike urine for 2 weeks, and fever and chills for 1 day.  Admitted for severe sepsis due to UTI.  In ED, tachycardic to 130s.  Hypotensive.  Febrile to 104.  WBC 13> 19.  Lactic acid 4.3> 10> 1.1.  UA concerning for UTI.  CXR with bronchitic changes.  EKG sinus tachycardia with PACs, PVC and RBBB.  Resuscitated with IV fluid with improvement in his vital signs.  Cultures obtained.  Started on antibiotics and admitted for severe sepsis likely due to UTI.  CT abdomen and pelvis with increased bilateral hydronephrosis and hydroureter potentially related to bladder distention due to possible outlet obstruction, extensive bone destruction of the endplates of 624THL suspicious for discitis, bibasilar atelectasis with mild infiltrate, right> left.  Troponin 7365 which was thought to be demand ischemia from severe sepsis in the absence of cardiopulmonary symptoms.  Cardiology consulted and discontinued IV heparin.  Urology consulted for increased bilateral hydronephrosis and urinary retention.  However, patient refused urologic intervention including Foley.  Blood and urine culture with Citrobacter freundii sensitive to cefepime.  Vancomycin and Flagyl discontinued. He was transitioned to Bactrim based on culture sensitivity.  Palliative care consulted.  Goals of care addressed.  Patient to be discharged home with home hospice but daughter will not be at home to look after him until 09/16/2019  Subjective: Seen and examined earlier this morning.  No major events overnight or this morning.  No  complaints.  He denies chest pain, dyspnea, GI or UTI symptoms.  He states he is voiding without problem.  Objective: Vitals:   09/14/19 1641 09/14/19 1935 09/15/19 0544 09/15/19 0858  BP: (!) 139/58 (!) 134/55 (!) 128/52 (!) 119/53  Pulse: 80 84 68 79  Resp: (!) 22 20 14 15   Temp: 97.6 F (36.4 C) 97.6 F (36.4 C) 97.6 F (36.4 C) 97.7 F (36.5 C)  TempSrc: Oral Oral Oral Oral  SpO2: 98% 98% 95% 96%  Weight:   71.9 kg   Height:        Intake/Output Summary (Last 24 hours) at 09/15/2019 1533 Last data filed at 09/15/2019 1300 Gross per 24 hour  Intake 1334.59 ml  Output --  Net 1334.59 ml   Filed Weights   09/13/19 0418 09/14/19 0626 09/15/19 0544  Weight: 72.1 kg 71.3 kg 71.9 kg    Examination:  GENERAL: No apparent distress.  Nontoxic. HEENT: MMM.  Vision and hearing grossly intact.  NECK: Supple.  No apparent JVD.  RESP:  No IWOB. Good air movement bilaterally. CVS:  RRR. Heart sounds normal.  ABD/GI/GU: Bowel sounds present. Soft. Non tender.  MSK/EXT:  Moves extremities. No apparent deformity.  Significant muscle mass and subcu fat loss. SKIN: no apparent skin lesion or wound NEURO: Awake, alert and oriented appropriately.  No apparent focal neuro deficit. PSYCH: Calm. Normal affect.   Procedures:  None  Assessment & Plan: Severe sepsis due to Citrobacter bacteremia and UTI-aspiration pneumonia unlikely without cardiopulmonary symptoms.  Blood and urine culture with Citrobacter freundii-sensitive to cefepime and Bactrim.  Sepsis physiology resolving.  Procalcitonin and leukocytosis downtrending. -Vanco 3/22-3/23.  Flagyl 3/22-3/24.  Cefepime  3/22-3/25. -Bactrim 3/25>> -Mild aspiration risk per SLP.  Acute on chronic bilateral hydronephrosis and patient with history of urethral stricture-CT abdomen and pelvis shows increased bilateral hydro with bladder distention likely due to outlet obstruction and urethral stricture. -Refused intervention by urology  including Foley catheter.  Denies problem urinating.  Lactic acidosis: Likely due to sepsis.  Resolved.  Elevated troponin-likely demand ischemia from severe sepsis.  EKG with sinus tachycardia, PACs, PVCs and RBBB but no acute ischemic finding.  No cardiopulmonary symptoms. -Continue metoprolol, statin, Plavix and aspirin. -Cardiology signed off.  History of chronic discitis on suppressive antibiotics-reportedly has not taken his antibiotics in about 6 weeks. -Continue antibiotics as above -Pain control  Uncontrolled DM-2 with hyperglycemia: A1c 6.9%.  On Lantus 12 units daily and Prandin at home. Recent Labs    09/14/19 2146 09/15/19 0550 09/15/19 1225  GLUCAP 247* 149* 185*  -Continue Lantus and SSI -Continue statin  Paroxysmal A. fib: On metoprolol for rate control.  Not on anticoagulation. -Continue metoprolol  Left heel pressure injury: POA-x-ray without evidence of osteomyelitis. -Continue pressure offloading.  Hypokalemia/hypomagnesemia-resolved.  Bandemia-likely due to sepsis.  Resolving.  History of CVA: Stable -Continue home medications-statin, Plavix and aspirin  Goal of care: per patient's daughter he turned away [facility], palliative and hospice in the past. He said "I don't give a damn thing".  He also refused urologic intervention (Foley insertion) by urology.  Also refusing potassium supplementations.  He is DNR and DNI appropriately.  -Appreciate palliative care help-plan for discharge home with home hospice with no future hospitalization  Nutrition Problem: Severe Malnutrition Etiology: chronic illness(stoke w/ dysphagia)  Signs/Symptoms: energy intake < or equal to 75% for > or equal to 1 month, severe muscle depletion, mild fat depletion  Interventions: Boost Plus, MVI   DVT prophylaxis: Heparin drip Code Status: DNR/DNI Family Communication: updated his daughter, [name] over the phone on 3/24  Discharge barrier: Medically stable.  Safe disposition.   Patient to go home with home hospice but daughter will not be home until 09/16/2019 Patient is from: Home Final disposition: Home with daughter and home hospice on 3/27.  Consultants:  Cardiology (off) Urology (off) Palliative care   Microbiology summarized: COVID-19 negative so far Blood cultures Citrobacter freundii Urine Culture Citrobacter freundii  Sch Meds:  Scheduled Meds: . aspirin  81 mg Oral Q24H  . clopidogrel  75 mg Oral Daily  . insulin aspart  0-9 Units Subcutaneous TID WC  . insulin glargine  8 Units Subcutaneous Daily  . lactose free nutrition  237 mL Oral TID WC  . metoprolol tartrate  25 mg Oral BID  . multivitamin with minerals  1 tablet Oral Daily  . simvastatin  20 mg Oral q1600  . sulfamethoxazole-trimethoprim  1 tablet Oral Q12H  . tamsulosin  0.4 mg Oral Daily   Continuous Infusions:  PRN Meds:.acetaminophen **OR** acetaminophen, hydrALAZINE, loperamide, ondansetron **OR** ondansetron (ZOFRAN) IV  Antimicrobials: Anti-infectives (From admission, onward)   Start     Dose/Rate Route Frequency Ordered Stop   09/14/19 2200  sulfamethoxazole-trimethoprim (BACTRIM DS) 800-160 MG per tablet 1 tablet     1 tablet Oral Every 12 hours 09/14/19 1231 09/18/19 2159   09/12/19 2000  vancomycin (VANCOREADY) IVPB 1750 mg/350 mL  Status:  Discontinued     1,750 mg 175 mL/hr over 120 Minutes Intravenous Every 24 hours 09/11/19 2025 09/12/19 1641   09/12/19 1400  metroNIDAZOLE (FLAGYL) IVPB 500 mg  Status:  Discontinued     500 mg 100  mL/hr over 60 Minutes Intravenous Every 8 hours 09/12/19 0805 09/13/19 1519   09/12/19 0815  metroNIDAZOLE (FLAGYL) IVPB 500 mg     500 mg 100 mL/hr over 60 Minutes Intravenous  Once 09/12/19 0807 09/12/19 1205   09/12/19 0400  ceFEPIme (MAXIPIME) 2 g in sodium chloride 0.9 % 100 mL IVPB     2 g 200 mL/hr over 30 Minutes Intravenous Every 8 hours 09/11/19 2025 09/14/19 2229   09/11/19 1930  ceFEPIme (MAXIPIME) 2 g in sodium chloride  0.9 % 100 mL IVPB     2 g 200 mL/hr over 30 Minutes Intravenous  Once 09/11/19 1917 09/11/19 2103   09/11/19 1930  vancomycin (VANCOCIN) IVPB 1000 mg/200 mL premix     1,000 mg 200 mL/hr over 60 Minutes Intravenous  Once 09/11/19 1917 09/11/19 2100       I have personally reviewed the following labs and images: CBC: Recent Labs  Lab 09/11/19 1912 09/11/19 1912 09/12/19 0518 09/12/19 0737 09/13/19 0925 09/14/19 0338 09/15/19 0221  WBC 13.2*   < > 19.0* 18.0* 21.5* 17.1* 11.5*  NEUTROABS 11.7*  --   --  14.3*  --   --   --   HGB 14.3   < > 11.8* 11.8* 11.7* 11.0* 10.8*  HCT 46.0   < > 37.6* 37.6* 35.9* 33.9* 34.1*  MCV 85.8   < > 85.6 86.0 83.1 82.7 86.3  PLT 252   < > 201 223 229 231 198   < > = values in this interval not displayed.   BMP &GFR Recent Labs  Lab 09/11/19 1912 09/11/19 1912 09/12/19 0518 09/12/19 0737 09/13/19 0925 09/14/19 0338 09/15/19 0221  NA 140  --   --  141 140 141 139  K 4.1  --   --  3.4* 3.4* 2.8* 3.6  CL 101  --   --  107 106 107 104  CO2 23  --   --  26 23 25 22   GLUCOSE 191*  --   --  109* 198* 74 185*  BUN 30*  --   --  26* 20 14 19   CREATININE 0.77   < > 0.58* 0.58* 0.71 0.55* 0.61  CALCIUM 8.2*  --   --  7.8* 7.8* 8.0* 7.9*  MG  --   --   --   --  1.7 1.6* 1.8  PHOS  --   --   --   --  3.0 3.0 2.9   < > = values in this interval not displayed.   Estimated Creatinine Clearance: 77.4 mL/min (by C-G formula based on SCr of 0.61 mg/dL). Liver & Pancreas: Recent Labs  Lab 09/11/19 1912 09/12/19 0737 09/13/19 0925 09/14/19 0338 09/15/19 0221  AST 29 50* 50*  --   --   ALT 23 21 22   --   --   ALKPHOS 68 58 67  --   --   BILITOT 0.5 0.6 0.5  --   --   PROT 6.2* 5.1* 5.6*  --   --   ALBUMIN 2.0* 1.7* 1.6*  1.6* 1.5* 1.6*   No results for input(s): LIPASE, AMYLASE in the last 168 hours. No results for input(s): AMMONIA in the last 168 hours. Diabetic: Recent Labs    09/14/19 0338  HGBA1C 6.9*   Recent Labs  Lab  09/14/19 1201 09/14/19 1638 09/14/19 2146 09/15/19 0550 09/15/19 1225  GLUCAP 220* 217* 247* 149* 185*   Cardiac Enzymes: Recent Labs  Lab 09/14/19  0338  CKTOTAL 60   No results for input(s): PROBNP in the last 8760 hours. Coagulation Profile: No results for input(s): INR, PROTIME in the last 168 hours. Thyroid Function Tests: No results for input(s): TSH, T4TOTAL, FREET4, T3FREE, THYROIDAB in the last 72 hours. Lipid Profile: No results for input(s): CHOL, HDL, LDLCALC, TRIG, CHOLHDL, LDLDIRECT in the last 72 hours. Anemia Panel: No results for input(s): VITAMINB12, FOLATE, FERRITIN, TIBC, IRON, RETICCTPCT in the last 72 hours. Urine analysis:    Component Value Date/Time   COLORURINE YELLOW 09/11/2019 2139   APPEARANCEUR TURBID (A) 09/11/2019 2139   LABSPEC 1.014 09/11/2019 2139   PHURINE 5.0 09/11/2019 2139   GLUCOSEU NEGATIVE 09/11/2019 2139   HGBUR LARGE (A) 09/11/2019 2139   BILIRUBINUR NEGATIVE 09/11/2019 2139   KETONESUR NEGATIVE 09/11/2019 2139   PROTEINUR 100 (A) 09/11/2019 2139   UROBILINOGEN 0.2 05/29/2014 2125   NITRITE NEGATIVE 09/11/2019 2139   LEUKOCYTESUR LARGE (A) 09/11/2019 2139   Sepsis Labs: Invalid input(s): PROCALCITONIN, [place]  Microbiology: Recent Results (from the past 240 hour(s))  Blood Culture (routine x 2)     Status: Abnormal   Collection Time: 09/11/19  7:12 PM   Specimen: BLOOD  Result Value Ref Range Status   Specimen Description BLOOD SITE NOT SPECIFIED  Final   Special Requests   Final    BOTTLES DRAWN AEROBIC AND ANAEROBIC Blood Culture results may not be optimal due to an inadequate volume of blood received in culture bottles Performed at [facility] [address] , [place] [address]    Culture  Setup Time   Final    GRAM NEGATIVE RODS AEROBIC BOTTLE ONLY CRITICAL RESULT CALLED TO, READ BACK BY AND VERIFIED WITH: PHARMD JEREMY F. [id number] FCP    Culture CITROBACTER FREUNDII (A)  Final   Report  Status 09/14/2019 FINAL  Final   Organism ID, Bacteria CITROBACTER FREUNDII  Final      Susceptibility   Citrobacter freundii - MIC*    CEFAZOLIN >=64 RESISTANT Resistant     CEFEPIME 2 SENSITIVE Sensitive     CEFTAZIDIME >=64 RESISTANT Resistant     CEFTRIAXONE >=64 RESISTANT Resistant     CIPROFLOXACIN <=0.25 SENSITIVE Sensitive     GENTAMICIN <=1 SENSITIVE Sensitive     IMIPENEM <=0.25 SENSITIVE Sensitive     TRIMETH/SULFA <=20 SENSITIVE Sensitive     PIP/TAZO 32 INTERMEDIATE Intermediate     * CITROBACTER FREUNDII  Blood Culture ID Panel (Reflexed)     Status: Abnormal   Collection Time: 09/11/19  7:12 PM  Result Value Ref Range Status   Enterococcus species NOT DETECTED NOT DETECTED Final   Listeria monocytogenes NOT DETECTED NOT DETECTED Final   Staphylococcus species NOT DETECTED NOT DETECTED Final   Staphylococcus aureus (BCID) NOT DETECTED NOT DETECTED Final   Streptococcus species NOT DETECTED NOT DETECTED Final   Streptococcus agalactiae NOT DETECTED NOT DETECTED Final   Streptococcus pneumoniae NOT DETECTED NOT DETECTED Final   Streptococcus pyogenes NOT DETECTED NOT DETECTED Final   Acinetobacter baumannii NOT DETECTED NOT DETECTED Final   Enterobacteriaceae species DETECTED (A) NOT DETECTED Final    Comment: Enterobacteriaceae represent a large family of gram negative bacteria, not a single organism. Refer to culture for further identification. CRITICAL RESULT CALLED TO, READ BACK BY AND VERIFIED WITH: PHARMD JEREMY F. [id number] FCP    Enterobacter cloacae complex NOT DETECTED NOT DETECTED Final   Escherichia coli NOT DETECTED NOT DETECTED Final   Klebsiella oxytoca NOT  DETECTED NOT DETECTED Final   Klebsiella pneumoniae NOT DETECTED NOT DETECTED Final   Proteus species NOT DETECTED NOT DETECTED Final   Serratia marcescens NOT DETECTED NOT DETECTED Final   Carbapenem resistance NOT DETECTED NOT DETECTED Final   Haemophilus influenzae NOT DETECTED NOT DETECTED  Final   Neisseria meningitidis NOT DETECTED NOT DETECTED Final   Pseudomonas aeruginosa NOT DETECTED NOT DETECTED Final   Candida albicans NOT DETECTED NOT DETECTED Final   Candida glabrata NOT DETECTED NOT DETECTED Final   Candida krusei NOT DETECTED NOT DETECTED Final   Candida parapsilosis NOT DETECTED NOT DETECTED Final   Candida tropicalis NOT DETECTED NOT DETECTED Final  Blood Culture (routine x 2)     Status: Abnormal (Preliminary result)   Collection Time: 09/11/19  7:45 PM   Specimen: BLOOD RIGHT FOREARM  Result Value Ref Range Status   Specimen Description BLOOD RIGHT FOREARM  Final   Special Requests   Final    BOTTLES DRAWN AEROBIC AND ANAEROBIC Blood Culture results may not be optimal due to an inadequate volume of blood received in culture bottles   Culture  Setup Time   Final    GRAM NEGATIVE RODS ANAEROBIC BOTTLE ONLY CRITICAL VALUE NOTED.  VALUE IS CONSISTENT WITH PREVIOUSLY REPORTED AND CALLED VALUE.    Culture (A)  Final    CITROBACTER FREUNDII SUSCEPTIBILITIES PERFORMED ON PREVIOUS CULTURE WITHIN THE LAST 5 DAYS. Performed at [facility], [place] [address] [place], [place] [address]    Report Status PENDING  Incomplete  Urine culture     Status: Abnormal   Collection Time: 09/11/19  9:30 PM   Specimen: In/Out Cath Urine  Result Value Ref Range Status   Specimen Description IN/OUT CATH URINE  Final   Special Requests   Final    NONE Performed at [facility], [place] [address] [place], [place] [address]    Culture >=100,000 COLONIES/mL CITROBACTER FREUNDII (A)  Final   Report Status 09/13/2019 FINAL  Final   Organism ID, Bacteria CITROBACTER FREUNDII (A)  Final      Susceptibility   Citrobacter freundii - MIC*    CEFAZOLIN >=64 RESISTANT Resistant     CEFEPIME 2 SENSITIVE Sensitive     CEFTRIAXONE >=64 RESISTANT Resistant     CIPROFLOXACIN <=0.25 SENSITIVE Sensitive     GENTAMICIN <=1 SENSITIVE Sensitive     IMIPENEM <=0.25 SENSITIVE Sensitive      NITROFURANTOIN <=16 SENSITIVE Sensitive     TRIMETH/SULFA <=20 SENSITIVE Sensitive     PIP/TAZO 32 INTERMEDIATE Intermediate     * >=100,000 COLONIES/mL CITROBACTER FREUNDII  SARS CORONAVIRUS 2 (TAT 6-24 HRS) Nasopharyngeal Nasopharyngeal Swab     Status: None   Collection Time: 09/11/19 10:20 PM   Specimen: Nasopharyngeal Swab  Result Value Ref Range Status   SARS Coronavirus 2 NEGATIVE NEGATIVE Final    Comment: (NOTE) SARS-CoV-2 target nucleic acids are NOT DETECTED. The SARS-CoV-2 RNA is generally detectable in upper and lower respiratory specimens during the acute phase of infection. Negative results do not preclude SARS-CoV-2 infection, do not rule out co-infections with other pathogens, and should not be used as the sole basis for treatment or other patient management decisions. Negative results must be combined with clinical observations, patient history, and epidemiological information. The expected result is Negative. Fact Sheet for Patients: SugarRoll.be Fact Sheet for Healthcare Providers: https://www.woods-mathews.com/ This test is not yet approved or cleared by the [place] FDA and  has been authorized for detection and/or  diagnosis of SARS-CoV-2 by FDA under an Emergency Use Authorization (EUA). This EUA will remain  in effect (meaning this test can be used) for the duration of the COVID-19 declaration under Section 56 4(b)(1) of the Act, 21 U.S.C. section 360bbb-3(b)(1), unless the authorization is terminated or revoked sooner. Performed at [facility], [place] [address], [place], [place] [address]     Radiology Studies: No results found.  Siyona Coto T. [place]  If 7PM-7AM, please contact night-coverage [email or web address] Password [facility] 09/15/2019, 3:33 PM

## 2019-09-16 DIAGNOSIS — Z789 Other specified health status: Secondary | ICD-10-CM

## 2019-09-16 LAB — CULTURE, BLOOD (ROUTINE X 2)

## 2019-09-16 LAB — GLUCOSE, CAPILLARY
Glucose-Capillary: 91 mg/dL (ref 70–99)
Glucose-Capillary: 181 mg/dL — ABNORMAL HIGH (ref 70–99)
Glucose-Capillary: 129 mg/dL — ABNORMAL HIGH (ref 70–99)

## 2019-09-16 MED ORDER — ONDANSETRON HCL 4 MG PO TABS: 4.0000 mg | ORAL_TABLET | Freq: Every day | ORAL | 1 refills | Status: AC | PRN

## 2019-09-16 MED ORDER — SULFAMETHOXAZOLE-TRIMETHOPRIM 800-160 MG PO TABS: 1.0000 | ORAL_TABLET | Freq: Two times a day (BID) | ORAL | 0 refills | Status: AC

## 2019-09-16 MED ORDER — LORAZEPAM 0.5 MG PO TABS: 0.5000 mg | ORAL_TABLET | ORAL | 0 refills | Status: AC | PRN

## 2019-09-16 MED ORDER — AMLODIPINE BESYLATE 10 MG PO TABS: 10.0000 mg | ORAL_TABLET | Freq: Every day | ORAL | Status: AC

## 2019-09-16 MED ORDER — MORPHINE SULFATE (CONCENTRATE) 10 MG /0.5 ML PO SOLN: 10.0000 mg | ORAL | 0 refills | Status: AC | PRN

## 2019-09-16 NOTE — Discharge Summary (Signed)
Physician Discharge Summary  [name] [id number] DOB: [birth year] DOA: 09/11/2019  PCP: Alroy Dust, L.Marlou Sa, MD  Admit date: 09/11/2019 Discharge date: 09/16/2019  Admitted From: Home Disposition: Home with home hospice   Discharge Condition: Stable CODE STATUS: DNR/DNI  Follow-up Information    [facility] Follow up.   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 fib and recurrent UTI presenting with puslike urine for 2 weeks, and fever and chills for 1 day.  Admitted for severe sepsis due to UTI.  In ED, tachycardic to 130s.  Hypotensive.  Febrile to 104.  WBC 13> 19.  Lactic acid 4.3> 10> 1.1.  UA concerning for UTI.  CXR with bronchitic changes.  EKG sinus tachycardia with PACs, PVC and RBBB.  Resuscitated with IV fluid with improvement in his vital signs.  Cultures obtained.  Started on antibiotics and admitted for severe sepsis likely due to UTI.  CT abdomen and pelvis with increased bilateral hydronephrosis and hydroureter potentially related to bladder distention due to possible outlet obstruction, extensive bone destruction of the endplates of 624THL suspicious for discitis, bibasilar atelectasis with mild infiltrate, right> left.  Troponin 7365 which was thought to be demand ischemia from severe sepsis in the absence of cardiopulmonary symptoms.  Cardiology consulted and discontinued IV heparin.  Urology consulted for increased bilateral hydronephrosis and urinary retention.  However, patient refused urologic intervention including Foley.  Blood and urine culture with Citrobacter freundii sensitive to cefepime.  Vancomycin and Flagyl discontinued. He was transitioned to Bactrim DS  based on culture sensitivity, and discharged on this for 5 more days.  Palliative care consulted and recommended limited scope of treatment, avoiding hospitalization, no blood transfusion  or diagnostics.  Patient discharged to daughter's home with home hospice.  Discharge Diagnoses:  Severe sepsis due to Citrobacter bacteremia and UTI-aspiration pneumonia unlikely without cardiopulmonary symptoms.  Blood and urine culture with Citrobacter freundii-sensitive to cefepime and Bactrim.  Sepsis physiology resolved. Procalcitonin and leukocytosis downtrending. -Vanco 3/22-3/23.  Flagyl 3/22-3/24.  Cefepime 3/22-3/25. -Bactrim 3/25>>4/1 -Mild aspiration risk per SLP.  Acute on chronic bilateral hydronephrosis and patient with history of urethral stricture-CT abdomen and pelvis shows increased bilateral hydro with bladder distention likely due to outlet obstruction and urethral stricture. -Refused intervention by urology including Foley catheter.  Denies problem urinating.  Lactic acidosis: Likely due to sepsis.  Resolved.  Elevated troponin-likely demand ischemia from severe sepsis.  EKG with sinus tachycardia, PACs, PVCs and RBBB but no acute ischemic finding.  No cardiopulmonary symptoms. -Continue home metoprolol.  Discontinued Lipitor, Plavix and aspirin.  Hospice patient.  History of chronic discitis on suppressive antibiotics-reportedly has not taken his antibiotics in about 6 weeks. -Continue antibiotics as above -Rx for Roxanol  Uncontrolled DM-2 with hyperglycemia: A1c 6.9%.  On Lantus 12 units daily and Prandin at home. Recent Labs    09/15/19 1621 09/15/19 2115 09/16/19 0619  GLUCAP 162* 135* 91  -Continue home Tresiba and Prandin  Paroxysmal A. fib: On metoprolol for rate control.  -Continue metoprolol  Left heel pressure injury: POA-x-ray without evidence of osteomyelitis. -Wound care and offloading  Hypokalemia/hypomagnesemia-resolved.  Bandemia-likely due to  sepsis.  Resolving.  History of CVA: Stable  Goal of care: DNR/DNI with limited scope of  care as above.  Severe malnutrition -Liberate diet  Discharge Instructions  Discharge Instructions    Diet general   Complete by: As directed    Discharge instructions   Complete by: As directed    It has been a pleasure taking care of you! You were hospitalized with severe sepsis due to blood infection or urinary tract infection.  You improved after treatment with antibiotics to the point we think it is safe to let you go home.  We are discharging you more antibiotics to complete treatment course. Please review your new medication list and the directions before you take your medications.   Take care,     Allergies as of 09/16/2019      Reactions   Prevnar [pneumococcal 13-val Conj Vacc] Anaphylaxis   Actos [pioglitazone Hydrochloride] Other (See Comments)   A-fib   Bupropion Other (See Comments)   Pt reports hallucinations   Hydrochlorothiazide Other (See Comments)   Low potassium to lower   Sertraline Other (See Comments)   Pt reports hallucinations   Penicillins Rash   Tolerated cefepime, ceftriaxone Did it involve swelling of the face/tongue/throat, SOB, or low BP? No Did it involve sudden or severe rash/hives, skin peeling, or any reaction on the inside of your mouth or nose? Yes Did you need to seek medical attention at a hospital or doctor's office: Yes When did it last happen?79 years old If all above answers are "NO", may proceed with cephalosporin use.      Medication List    STOP taking these medications   aspirin 81 MG EC tablet   cephALEXin 500 MG capsule Commonly known as: KEFLEX   clopidogrel 75 MG tablet Commonly known as: PLAVIX   HYDROcodone-acetaminophen 5-325 MG tablet Commonly known as: NORCO/VICODIN   insulin glargine 100 UNIT/ML injection Commonly known as: LANTUS   simvastatin 20 MG tablet Commonly known as: ZOCOR     TAKE these  medications   acetaminophen 500 MG tablet Commonly known as: TYLENOL Take 500-1,000 mg by mouth every 6 (six) hours as needed for mild pain or headache.   amLODipine 10 MG tablet Commonly known as: NORVASC Take 1 tablet (10 mg total) by mouth daily. What changed:   how much to take  when to take this  Another medication with the same name was removed. Continue taking this medication, and follow the directions you see here.   bisacodyl 5 MG EC tablet Commonly known as: DULCOLAX Take 2 tablets (10 mg total) by mouth daily as needed for moderate constipation.   lactose free nutrition Liqd Take 237 mLs by mouth 3 (three) times daily with meals.   lisinopril 10 MG tablet Commonly known as: ZESTRIL Take 10 mg by mouth daily. What changed: Another medication with the same name was removed. Continue taking this medication, and follow the directions you see here.   LORazepam 0.5 MG tablet Commonly known as: Ativan Take 1 tablet (0.5 mg total) by mouth every 4 (four) hours as needed for up to 20 doses for anxiety.   metoprolol tartrate 25 MG tablet Commonly known as: LOPRESSOR Take 0.5 tablets (12.5 mg total) by mouth 2 (two) times daily. What changed: how much to take   morphine CONCENTRATE 10 mg / 0.5 ml concentrated solution Take 0.5 mLs (10 mg total) by mouth every 3 (three) hours as needed for moderate pain, severe pain or shortness of breath.   ondansetron 4 MG tablet Commonly known as: Zofran Take 1 tablet (4 mg total) by  mouth daily as needed for nausea or vomiting.   repaglinide 0.5 MG tablet Commonly known as: PRANDIN Take 0.5 mg by mouth daily.   sulfamethoxazole-trimethoprim 800-160 MG tablet Commonly known as: BACTRIM DS Take 1 tablet by mouth every 12 (twelve) hours.   Tyler Aas FlexTouch 100 UNIT/ML FlexTouch Pen Generic drug: insulin degludec Inject 14 Units into the skin See admin instructions. Inject 14 units into the skin once a day at 4:30 PM        Consultations:  Urology, palliative care  Procedures/Studies:   CT ABDOMEN PELVIS WO CONTRAST  Result Date: 09/12/2019 CLINICAL DATA:  Abdominal pain and fever, does not feel good, pus coming from penis for 1 week, history type II diabetes mellitus, hypertension, diffuse large B-cell non-Hodgkin's lymphoma, atrial fibrillation EXAM: CT ABDOMEN AND PELVIS WITHOUT CONTRAST TECHNIQUE: Multidetector CT imaging of the abdomen and pelvis was performed following the standard protocol without IV contrast. Sagittal and coronal MPR images reconstructed from axial data set. No oral contrast was administered COMPARISON:  04/30/2019 FINDINGS: Lower chest: Emphysematous changes and peribronchial thickening. Consolidation in BILATERAL lower lobes RIGHT greater than LEFT as well as dependent atelectasis. Hepatobiliary: Calcified gallstones dependently in gallbladder. Questionable mild gallbladder wall thickening. Liver unremarkable. Pancreas: Normal appearance Spleen: Calcified granulomata within spleen Adrenals/Urinary Tract: Adrenal glands unremarkable. Cyst at upper pole RIGHT kidney 2.9 x 2.5 cm image 28. BILATERAL hydronephrosis and hydroureter. Distended bladder with mild bladder wall thickening. No definite bladder mass. Stomach/Bowel: Normal appendix. Stool throughout colon. Prominent stool in rectum. Stomach decompressed with suboptimal assessment of gastric wall thickness. Small bowel loops unremarkable. Vascular/Lymphatic: Extensive atherosclerotic calcifications aorta, coronary arteries, iliac arteries, visceral arteries. This includes significant calcified plaque at the origins of the renal arteries bilaterally, SMA, and to lesser degree celiac artery. Aorta normal caliber. No adenopathy. Reproductive: Prostatic enlargement, gland 5.5 x 3.5 cm. Other: No free air free fluid. Question small LEFT inguinal hernia containing fat. Musculoskeletal: Scattered muscular atrophy. Bones demineralized. Scattered  degenerative changes lumbar spine. Extensive bone destruction involving the endplates at 624THL, similar to previous exam, suspicious for discitis. Minor superior endplate compression deformity of T12 unchanged. Schmorl's nodes at superior and inferior L4. IMPRESSION: Cholelithiasis with questionable mild gallbladder wall thickening, recommend correlation with ultrasound. Increased BILATERAL hydronephrosis and hydroureter potentially related to bladder distention. Bladder wall thickening which could be related to prostatic enlargement and/or chronic outlet obstruction though cystitis not excluded; recommend correlation with urinalysis. Extensive bone destruction at the endplates at 624THL suspicious for discitis, similar to previous exam. Bibasilar atelectasis and mild pulmonary infiltrates RIGHT greater than LEFT. Question small LEFT inguinal hernia containing fat. Extensive coronary arterial calcification. Aortic Atherosclerosis (ICD10-I70.0) and Emphysema (ICD10-J43.9). Electronically Signed   By: Lavonia Dana M.D.   On: 09/12/2019 08:29   DG Chest Port 1 View  Result Date: 09/11/2019 CLINICAL DATA:  Fever EXAM: PORTABLE CHEST 1 VIEW COMPARISON:  04/30/2019 FINDINGS: Heart and mediastinal contours are within normal limits. No focal opacities or effusions. No acute bony abnormality. Mild peribronchial thickening. IMPRESSION: Mild bronchitic changes. Electronically Signed   By: Rolm Baptise M.D.   On: 09/11/2019 19:38   DG Foot 2 Views Left  Result Date: 09/12/2019 CLINICAL DATA:  Nonhealing wound for several months over the heel, initial encounter EXAM: LEFT FOOT - 2 VIEW COMPARISON:  None. FINDINGS: Postsurgical changes are noted in the distal fibula. Degenerative changes of the tibiotalar joint are seen. No acute fracture or dislocation is noted. Diffuse vascular calcifications are seen. The known heel  wound is not well appreciated. Some remodeling of the posterior aspect of the calcaneus is seen. This may  be projectional in nature as only a oblique image is submitted. IMPRESSION: Mild remodeling of the posterior aspect of the calcaneus although this may be projectional in nature. No definitive erosive changes to suggest osteomyelitis are seen. Mild degenerative change without acute abnormality. Electronically Signed   By: Inez Catalina M.D.   On: 09/12/2019 08:06      Discharge Exam: Vitals:   09/15/19 2116 09/16/19 0529  BP: 118/60 (!) 149/64  Pulse: 78 77  Resp: 18 19  Temp: 97.7 F (36.5 C) 98 F (36.7 C)  SpO2: 96% 94%    GENERAL: No apparent distress.  Nontoxic. HEENT: MMM.  Vision and hearing grossly intact.  NECK: Supple.  No apparent JVD.  RESP:  No IWOB. Good air movement bilaterally. CVS:  RRR. Heart sounds normal.  ABD/GI/GU: Bowel sounds present. Soft. Non tender.  MSK/EXT:  Moves extremities.  Significant muscle mass and subcu fat loss. SKIN: no apparent skin lesion or wound NEURO: Awake, alert and oriented appropriately.  No apparent focal neuro deficit. PSYCH: Calm. Normal affect.   The results of significant diagnostics from this hospitalization (including imaging, microbiology, ancillary and laboratory) are listed below for reference.     Microbiology: Recent Results (from the past 240 hour(s))  Blood Culture (routine x 2)     Status: Abnormal   Collection Time: 09/11/19  7:12 PM   Specimen: BLOOD  Result Value Ref Range Status   Specimen Description BLOOD SITE NOT SPECIFIED  Final   Special Requests   Final    BOTTLES DRAWN AEROBIC AND ANAEROBIC Blood Culture results may not be optimal due to an inadequate volume of blood received in culture bottles Performed at [facility], [place] [address], [place], [place] [address]    Culture  Setup Time   Final    GRAM NEGATIVE RODS AEROBIC BOTTLE ONLY CRITICAL RESULT CALLED TO, READ BACK BY AND VERIFIED WITH: PHARMD JEREMY F. [id number] FCP    Culture CITROBACTER FREUNDII (A)  Final   Report Status  09/14/2019 FINAL  Final   Organism ID, Bacteria CITROBACTER FREUNDII  Final      Susceptibility   Citrobacter freundii - MIC*    CEFAZOLIN >=64 RESISTANT Resistant     CEFEPIME 2 SENSITIVE Sensitive     CEFTAZIDIME >=64 RESISTANT Resistant     CEFTRIAXONE >=64 RESISTANT Resistant     CIPROFLOXACIN <=0.25 SENSITIVE Sensitive     GENTAMICIN <=1 SENSITIVE Sensitive     IMIPENEM <=0.25 SENSITIVE Sensitive     TRIMETH/SULFA <=20 SENSITIVE Sensitive     PIP/TAZO 32 INTERMEDIATE Intermediate     * CITROBACTER FREUNDII  Blood Culture ID Panel (Reflexed)     Status: Abnormal   Collection Time: 09/11/19  7:12 PM  Result Value Ref Range Status   Enterococcus species NOT DETECTED NOT DETECTED Final   Listeria monocytogenes NOT DETECTED NOT DETECTED Final   Staphylococcus species NOT DETECTED NOT DETECTED Final   Staphylococcus aureus (BCID) NOT DETECTED NOT DETECTED Final   Streptococcus species NOT DETECTED NOT DETECTED Final   Streptococcus agalactiae NOT DETECTED NOT DETECTED Final   Streptococcus pneumoniae NOT DETECTED NOT DETECTED Final   Streptococcus pyogenes NOT DETECTED NOT DETECTED Final   Acinetobacter baumannii NOT DETECTED NOT DETECTED Final   Enterobacteriaceae species DETECTED (A) NOT DETECTED Final    Comment: Enterobacteriaceae represent a large family of gram negative  bacteria, not a single organism. Refer to culture for further identification. CRITICAL RESULT CALLED TO, READ BACK BY AND VERIFIED WITH: PHARMD JEREMY F. [id number] FCP    Enterobacter cloacae complex NOT DETECTED NOT DETECTED Final   Escherichia coli NOT DETECTED NOT DETECTED Final   Klebsiella oxytoca NOT DETECTED NOT DETECTED Final   Klebsiella pneumoniae NOT DETECTED NOT DETECTED Final   Proteus species NOT DETECTED NOT DETECTED Final   Serratia marcescens NOT DETECTED NOT DETECTED Final   Carbapenem resistance NOT DETECTED NOT DETECTED Final   Haemophilus influenzae NOT DETECTED NOT DETECTED Final    Neisseria meningitidis NOT DETECTED NOT DETECTED Final   Pseudomonas aeruginosa NOT DETECTED NOT DETECTED Final   Candida albicans NOT DETECTED NOT DETECTED Final   Candida glabrata NOT DETECTED NOT DETECTED Final   Candida krusei NOT DETECTED NOT DETECTED Final   Candida parapsilosis NOT DETECTED NOT DETECTED Final   Candida tropicalis NOT DETECTED NOT DETECTED Final  Blood Culture (routine x 2)     Status: Abnormal (Preliminary result)   Collection Time: 09/11/19  7:45 PM   Specimen: BLOOD RIGHT FOREARM  Result Value Ref Range Status   Specimen Description BLOOD RIGHT FOREARM  Final   Special Requests   Final    BOTTLES DRAWN AEROBIC AND ANAEROBIC Blood Culture results may not be optimal due to an inadequate volume of blood received in culture bottles   Culture  Setup Time   Final    GRAM NEGATIVE RODS ANAEROBIC BOTTLE ONLY CRITICAL VALUE NOTED.  VALUE IS CONSISTENT WITH PREVIOUSLY REPORTED AND CALLED VALUE.    Culture (A)  Final    CITROBACTER FREUNDII SUSCEPTIBILITIES PERFORMED ON PREVIOUS CULTURE WITHIN THE LAST 5 DAYS. Performed at [facility], [place] [address] [place], [place] [address]    Report Status PENDING  Incomplete  Urine culture     Status: Abnormal   Collection Time: 09/11/19  9:30 PM   Specimen: In/Out Cath Urine  Result Value Ref Range Status   Specimen Description IN/OUT CATH URINE  Final   Special Requests   Final    NONE Performed at [facility], [place] [address] [place], [place] [address]    Culture >=100,000 COLONIES/mL CITROBACTER FREUNDII (A)  Final   Report Status 09/13/2019 FINAL  Final   Organism ID, Bacteria CITROBACTER FREUNDII (A)  Final      Susceptibility   Citrobacter freundii - MIC*    CEFAZOLIN >=64 RESISTANT Resistant     CEFEPIME 2 SENSITIVE Sensitive     CEFTRIAXONE >=64 RESISTANT Resistant     CIPROFLOXACIN <=0.25 SENSITIVE Sensitive     GENTAMICIN <=1 SENSITIVE Sensitive     IMIPENEM <=0.25 SENSITIVE Sensitive      NITROFURANTOIN <=16 SENSITIVE Sensitive     TRIMETH/SULFA <=20 SENSITIVE Sensitive     PIP/TAZO 32 INTERMEDIATE Intermediate     * >=100,000 COLONIES/mL CITROBACTER FREUNDII  SARS CORONAVIRUS 2 (TAT 6-24 HRS) Nasopharyngeal Nasopharyngeal Swab     Status: None   Collection Time: 09/11/19 10:20 PM   Specimen: Nasopharyngeal Swab  Result Value Ref Range Status   SARS Coronavirus 2 NEGATIVE NEGATIVE Final    Comment: (NOTE) SARS-CoV-2 target nucleic acids are NOT DETECTED. The SARS-CoV-2 RNA is generally detectable in upper and lower respiratory specimens during the acute phase of infection. Negative results do not preclude SARS-CoV-2 infection, do not rule out co-infections with other pathogens, and should not be used as the sole basis for treatment or other patient  management decisions. Negative results must be combined with clinical observations, patient history, and epidemiological information. The expected result is Negative. Fact Sheet for Patients: SugarRoll.be Fact Sheet for Healthcare Providers: https://www.woods-mathews.com/ This test is not yet approved or cleared by the [place] FDA and  has been authorized for detection and/or diagnosis of SARS-CoV-2 by FDA under an Emergency Use Authorization (EUA). This EUA will remain  in effect (meaning this test can be used) for the duration of the COVID-19 declaration under Section 56 4(b)(1) of the Act, 21 U.S.C. section 360bbb-3(b)(1), unless the authorization is terminated or revoked sooner. Performed at [facility], [place] [address] [place], [place] [address]      Labs: BNP (last 3 results) No results for input(s): BNP in the last 8760 hours. Basic Metabolic Panel: Recent Labs  Lab 09/11/19 1912 09/11/19 1912 09/12/19 0518 09/12/19 0737 09/13/19 0925 09/14/19 0338 09/15/19 0221  NA 140  --   --  141 140 141 139  K 4.1  --   --  3.4* 3.4* 2.8* 3.6  CL 101  --   --   107 106 107 104  CO2 23  --   --  26 23 25 22   GLUCOSE 191*  --   --  109* 198* 74 185*  BUN 30*  --   --  26* 20 14 19   CREATININE 0.77   < > 0.58* 0.58* 0.71 0.55* 0.61  CALCIUM 8.2*  --   --  7.8* 7.8* 8.0* 7.9*  MG  --   --   --   --  1.7 1.6* 1.8  PHOS  --   --   --   --  3.0 3.0 2.9   < > = values in this interval not displayed.   Liver Function Tests: Recent Labs  Lab 09/11/19 1912 09/12/19 0737 09/13/19 0925 09/14/19 0338 09/15/19 0221  AST 29 50* 50*  --   --   ALT 23 21 22   --   --   ALKPHOS 68 58 67  --   --   BILITOT 0.5 0.6 0.5  --   --   PROT 6.2* 5.1* 5.6*  --   --   ALBUMIN 2.0* 1.7* 1.6*  1.6* 1.5* 1.6*   No results for input(s): LIPASE, AMYLASE in the last 168 hours. No results for input(s): AMMONIA in the last 168 hours. CBC: Recent Labs  Lab 09/11/19 1912 09/11/19 1912 09/12/19 0518 09/12/19 0737 09/13/19 0925 09/14/19 0338 09/15/19 0221  WBC 13.2*   < > 19.0* 18.0* 21.5* 17.1* 11.5*  NEUTROABS 11.7*  --   --  14.3*  --   --   --   HGB 14.3   < > 11.8* 11.8* 11.7* 11.0* 10.8*  HCT 46.0   < > 37.6* 37.6* 35.9* 33.9* 34.1*  MCV 85.8   < > 85.6 86.0 83.1 82.7 86.3  PLT 252   < > 201 223 229 231 198   < > = values in this interval not displayed.   Cardiac Enzymes: Recent Labs  Lab 09/14/19 0338  CKTOTAL 60   BNP: Invalid input(s): POCBNP CBG: Recent Labs  Lab 09/15/19 0550 09/15/19 1225 09/15/19 1621 09/15/19 2115 09/16/19 0619  GLUCAP 149* 185* 162* 135* 91   D-Dimer No results for input(s): DDIMER in the last 72 hours. Hgb A1c Recent Labs    09/14/19 0338  HGBA1C 6.9*   Lipid Profile No results for input(s): CHOL, HDL, LDLCALC, TRIG, CHOLHDL, LDLDIRECT  in the last 72 hours. Thyroid function studies No results for input(s): TSH, T4TOTAL, T3FREE, THYROIDAB in the last 72 hours.  Invalid input(s): FREET3 Anemia work up No results for input(s): VITAMINB12, FOLATE, FERRITIN, TIBC, IRON, RETICCTPCT in the last 72  hours. Urinalysis    Component Value Date/Time   COLORURINE YELLOW 09/11/2019 2139   APPEARANCEUR TURBID (A) 09/11/2019 2139   LABSPEC 1.014 09/11/2019 2139   PHURINE 5.0 09/11/2019 2139   GLUCOSEU NEGATIVE 09/11/2019 2139   HGBUR LARGE (A) 09/11/2019 2139   BILIRUBINUR NEGATIVE 09/11/2019 2139   KETONESUR NEGATIVE 09/11/2019 2139   PROTEINUR 100 (A) 09/11/2019 2139   UROBILINOGEN 0.2 05/29/2014 2125   NITRITE NEGATIVE 09/11/2019 2139   LEUKOCYTESUR LARGE (A) 09/11/2019 2139   Sepsis Labs Invalid input(s): PROCALCITONIN,  WBC,  LACTICIDVEN   Time coordinating discharge: 35 minutes  SIGNED:  Mercy Riding, MD  [facility] 09/16/2019, 8:08 AM  If 7PM-7AM, please contact night-coverage [email or web address] Password [email or web address]

## 2019-09-16 NOTE — Progress Notes (Signed)
Discharge instructions provided to patient. All medications and follow up appointments discussed. IV out. Discharging home via PTAR.  Paulene Floor, RN

## 2019-09-16 NOTE — TOC Transition Note (Signed)
Transition of Care [facility]) - CM/SW Discharge Note   Patient Details  Name: [name] MRN: [id number] Date of Birth: [birth year]  [facility] CM/SW Contact:  Claudie Leach, RN 09/16/2019, 12:25 PM   Clinical Narrative:    Pt to dc home today with daughter and [facility] home hospice services.  Daughter requested dc be delayed one more day due to a family emergency.  Dr. Cyndia Skeeters made aware but states the discharge cannot be delayed longer.  Daughter agreed and gratefully accepted offer to delay PT[facility] transport until late this afternoon.    RN will send pressure relieving boots, DNR, MOST form and other paperwork with PT[facility].  [facility] scheduled for 5pm.      Patient Goals and CMS Choice Patient states their goals for this hospitalization and ongoing recovery are:: return home with [facility] under Hospice CMS Medicare.gov Compare Post Acute Care list provided to:: Patient Choice offered to / list presented to : Patient, Adult Children(Per PC- pt already active with [facility] and wants to stay wtih them)   Discharge Plan and Services   Discharge Planning Services: CM Consult Post Acute Care Choice: Resumption of Svcs/PTA Provider, Hospice                      [facility] Agency: Hospice and [place]            Readmission Risk Interventions Readmission Risk Prevention Plan 03/23/2019  Transportation Screening Complete  PCP or Specialist Appt within 3-5 Days Not Complete  Not Complete comments SNF placement planned  Some recent data might be hidden

## 2019-09-27 ENCOUNTER — Ambulatory Visit (INDEPENDENT_AMBULATORY_CARE_PROVIDER_SITE_OTHER): Payer: Medicare Other | Admitting: Internal Medicine

## 2019-09-27 ENCOUNTER — Encounter: Payer: Self-pay | Admitting: Internal Medicine

## 2019-09-27 ENCOUNTER — Other Ambulatory Visit: Payer: Self-pay

## 2019-09-27 DIAGNOSIS — M4647 Discitis, unspecified, lumbosacral region: Secondary | ICD-10-CM

## 2019-09-27 NOTE — Progress Notes (Signed)
I connected with  Clarita Leber on 09/27/19 by phone and verified that I am speaking with the correct person using two identifiers.   I discussed the limitations of evaluation and management by telemedicine. The patient expressed understanding and agreed to proceed.  HPI: he continues to be mainly bedbound and now under the care of hospice at home.  Has not been taking the Keflex but no significant changes in his back.    At this point, with him on hospice, will keep him off of the Keflex as that seems to have stabilized and no further ID needs at this time. 12 minutes spent on the call in discussion with the patient and his daughter.

## 2019-11-21 DEATH — deceased

## 2020-12-12 IMAGING — CT CT LUMBAR SPINE WITHOUT CONTRAST
3 series · 9 of 33 positions shown, 11 images · non-contrast
Comparison: Chest radiograph dated 07/05/2017 and CT of the abdomen
pelvis dated 10/07/2012.

CLINICAL DATA: 77-year-old male with fall and mid to lower back
pain.

EXAM:
CT THORACIC AND LUMBAR SPINE WITHOUT CONTRAST
TECHNIQUE: Multidetector CT imaging of the thoracic and lumbar spine was
performed without contrast. Multiplanar CT image reconstructions
were also generated.

[Series 8: l-spine 2.0 st · axial · 0.35mm/px · z∈[+1045,+1045]mm · 1 of 147 slices shown, 2 images]
[im 79/147  soft-tissue]
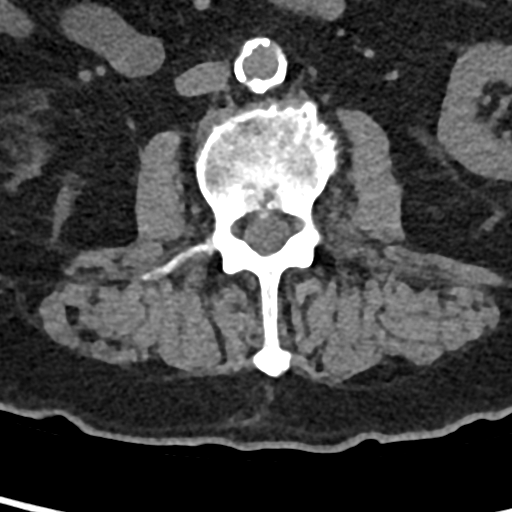
[im 79/147  bone]
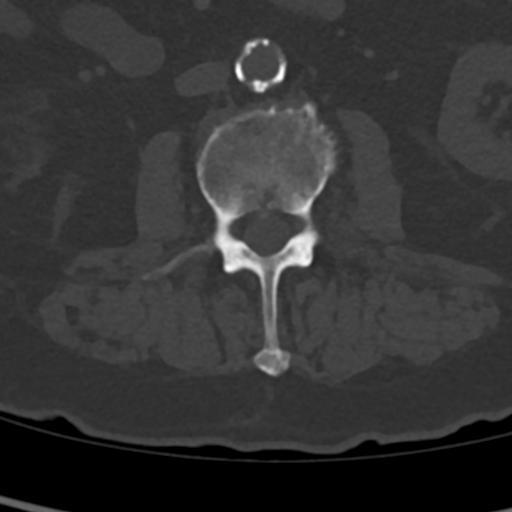

[Series 12: l-spine 2.0 cor bone · coronal · 0.29mm/px · 3 of 76 slices shown]
[im 16/76  bone]
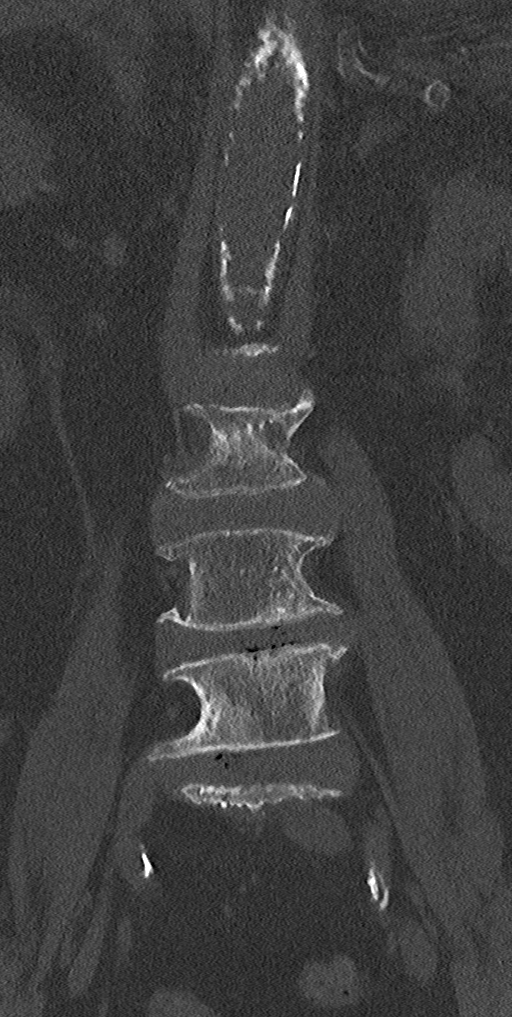
[im 31/76  bone]
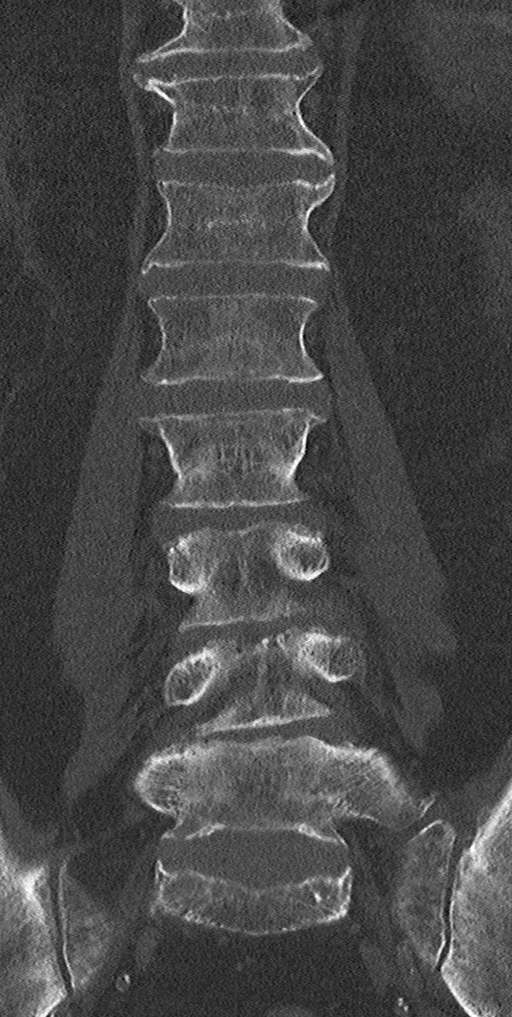
[im 46/76  bone]
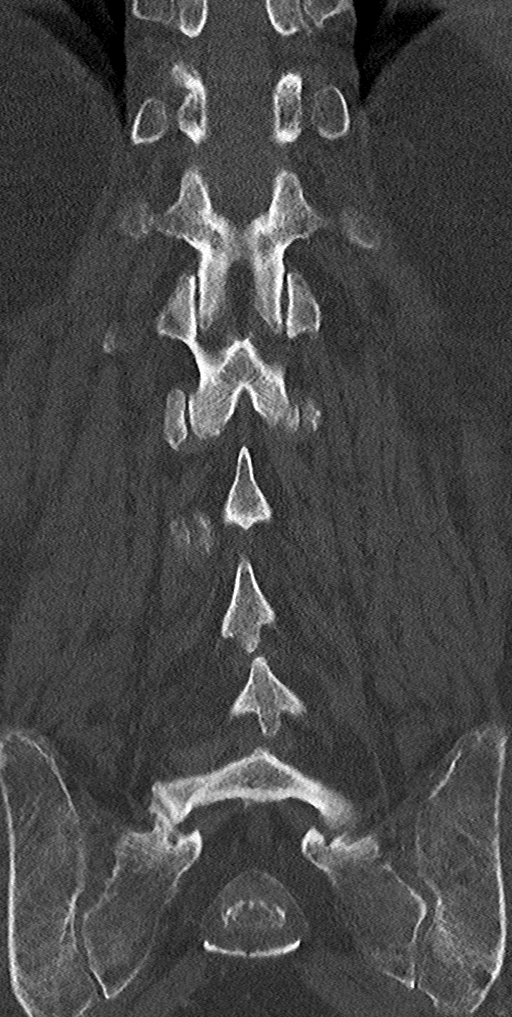

[Series 13: l-spine 2.0 sag bone · sagittal · 0.30mm/px · 5 of 71 slices shown, 6 images]
[im 24/71  bone]
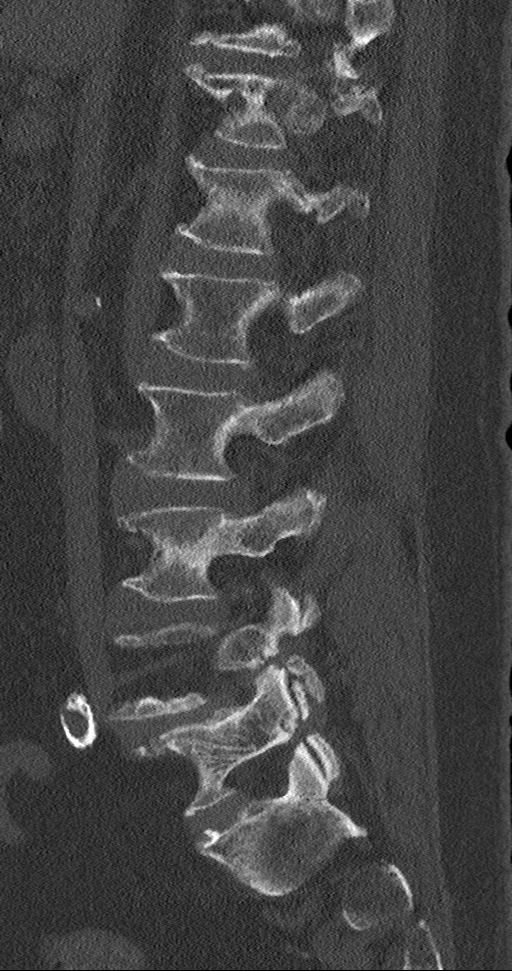
[im 30/71  bone]
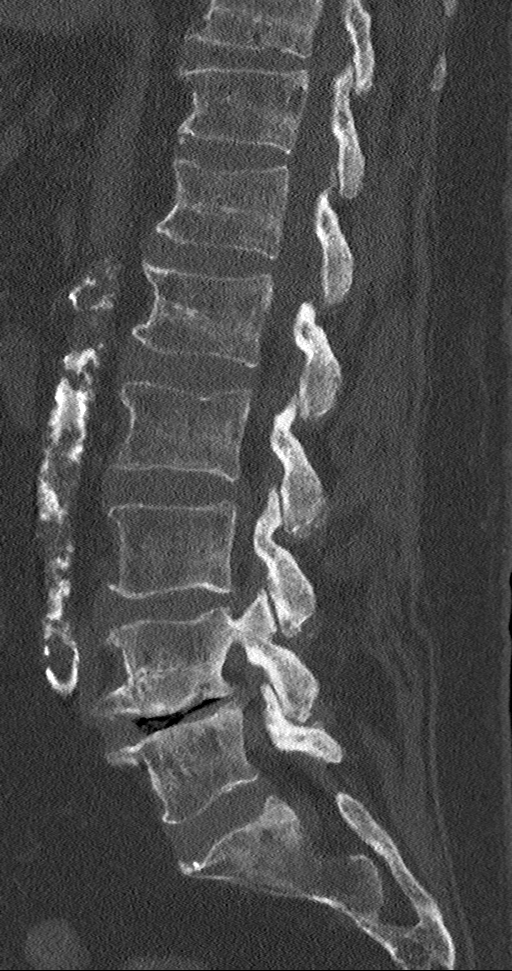
[im 36/71  soft-tissue]
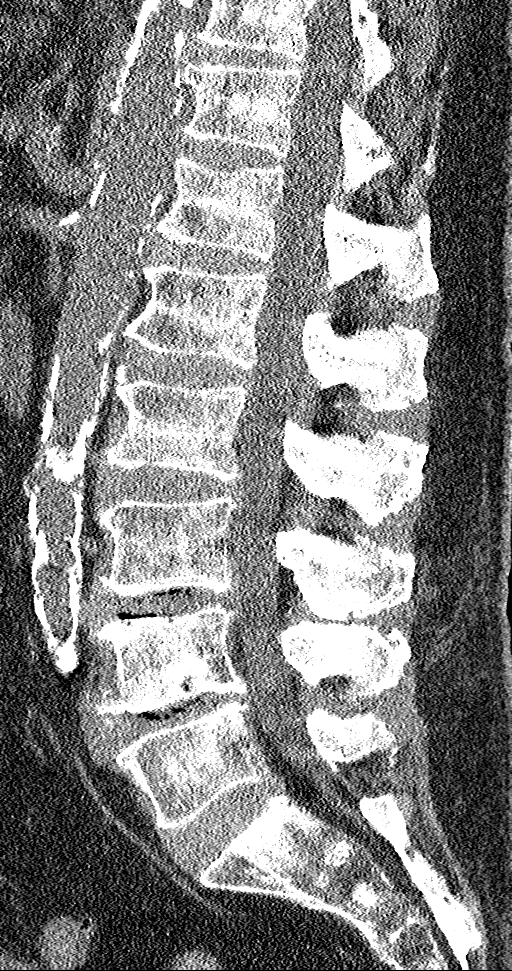
[im 36/71  bone]
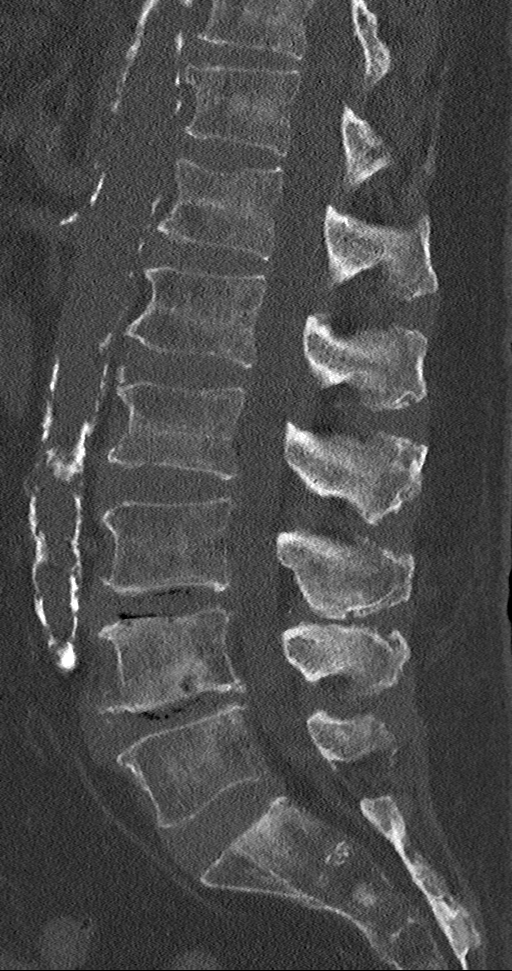
[im 41/71  bone]
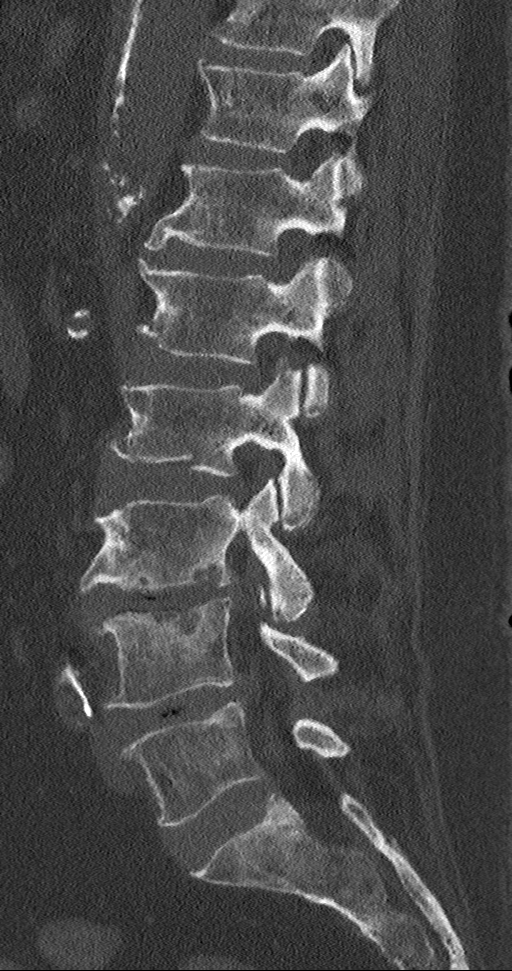
[im 47/71  bone]
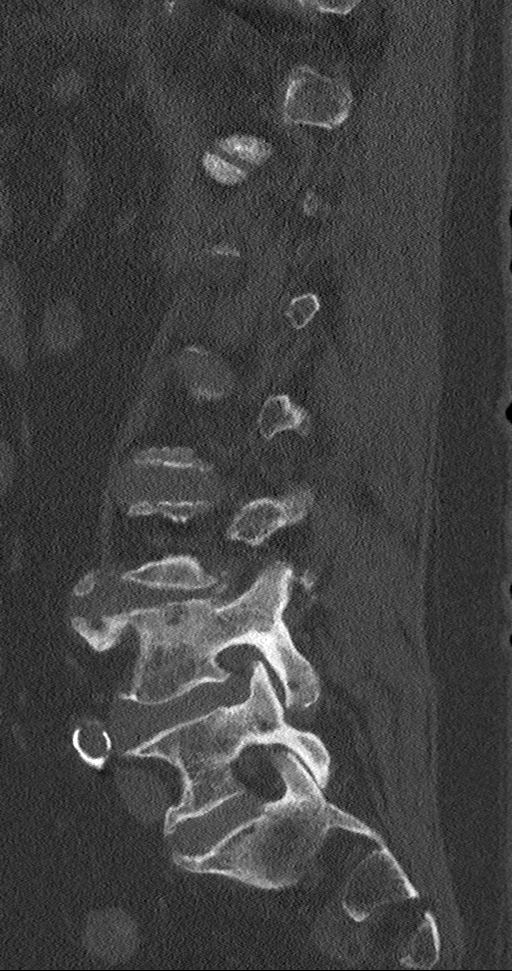

[9 of 33 positions shown; findings below may reference images not displayed]

FINDINGS: CT THORACIC SPINE FINDINGS

Alignment: Normal.

Vertebrae: No acute fracture. Osteopenia. The posterior elements are
intact.

Paraspinal and other soft tissues: No paraspinal fluid collection or
hematoma. There is atherosclerotic calcification of the visualized
aorta. Coronary vascular calcifications noted.

Disc levels: Multilevel degenerative changes. There is disc
desiccation and vacuum phenomena primarily at T7-T10.

CT LUMBAR SPINE FINDINGS

Segmentation: 6 non-rib-bearing vertebra.

Alignment: No acute subluxation.

Vertebrae: There is nondisplaced fracture of the inferior endplate
of the L3 (third non-rib-bearing vertebra). The bones are
osteopenic. No other acute fracture identified. No retropulsed
fragment.

Paraspinal and other soft tissues: Atherosclerotic calcification of
the aorta. There are multiple sigmoid diverticulosis. No paraspinal
soft tissue swelling or fluid collection or hematoma.

Disc levels: Multilevel degenerative disc disease. There is lower
lumbar disc desiccation and vacuum phenomena. Multilevel facet
arthropathy.
IMPRESSION: 1. No acute/traumatic thoracic spine pathology.
2. Mild nondisplaced compression fracture of the inferior endplate
of the third nonrib bearing lumbar spine vertebra.
3.  Aortic Atherosclerosis ([id number]).
